# Patient Record
Sex: Male | Born: 1965 | Race: Black or African American | Hispanic: No | Marital: Single | Smoking: Current every day smoker
Health system: Southern US, Community
[De-identification: ages and names within clinical notes are randomized; demographics above are authoritative.]

## PROBLEM LIST (undated history)

## (undated) DIAGNOSIS — I5042 Chronic combined systolic (congestive) and diastolic (congestive) heart failure: Secondary | ICD-10-CM

## (undated) DIAGNOSIS — I2510 Atherosclerotic heart disease of native coronary artery without angina pectoris: Secondary | ICD-10-CM

## (undated) DIAGNOSIS — J189 Pneumonia, unspecified organism: Secondary | ICD-10-CM

## (undated) DIAGNOSIS — I10 Essential (primary) hypertension: Secondary | ICD-10-CM

## (undated) DIAGNOSIS — Z91199 Patient's noncompliance with other medical treatment and regimen due to unspecified reason: Secondary | ICD-10-CM

## (undated) DIAGNOSIS — I5022 Chronic systolic (congestive) heart failure: Secondary | ICD-10-CM

## (undated) DIAGNOSIS — E119 Type 2 diabetes mellitus without complications: Secondary | ICD-10-CM

## (undated) DIAGNOSIS — R0602 Shortness of breath: Secondary | ICD-10-CM

## (undated) DIAGNOSIS — E785 Hyperlipidemia, unspecified: Secondary | ICD-10-CM

## (undated) DIAGNOSIS — Z87891 Personal history of nicotine dependence: Secondary | ICD-10-CM

## (undated) DIAGNOSIS — R609 Edema, unspecified: Secondary | ICD-10-CM

## (undated) DIAGNOSIS — Z9119 Patient's noncompliance with other medical treatment and regimen: Secondary | ICD-10-CM

## (undated) DIAGNOSIS — I491 Atrial premature depolarization: Secondary | ICD-10-CM

## (undated) DIAGNOSIS — G4733 Obstructive sleep apnea (adult) (pediatric): Secondary | ICD-10-CM

## (undated) DIAGNOSIS — I428 Other cardiomyopathies: Secondary | ICD-10-CM

## (undated) DIAGNOSIS — G4730 Sleep apnea, unspecified: Secondary | ICD-10-CM

## (undated) HISTORY — DX: Patient's noncompliance with other medical treatment and regimen: Z91.19

## (undated) HISTORY — DX: Patient's noncompliance with other medical treatment and regimen due to unspecified reason: Z91.199

## (undated) HISTORY — DX: Atherosclerotic heart disease of native coronary artery without angina pectoris: I25.10

## (undated) HISTORY — DX: Edema, unspecified: R60.9

## (undated) HISTORY — DX: Shortness of breath: R06.02

## (undated) HISTORY — DX: Chronic systolic (congestive) heart failure: I50.22

## (undated) HISTORY — DX: Other cardiomyopathies: I42.8

## (undated) HISTORY — PX: FRACTURE SURGERY: SHX138

## (undated) HISTORY — DX: Chronic combined systolic (congestive) and diastolic (congestive) heart failure: I50.42

## (undated) HISTORY — DX: Atrial premature depolarization: I49.1

## (undated) HISTORY — DX: Sleep apnea, unspecified: G47.30

---

## 1998-09-13 ENCOUNTER — Emergency Department (HOSPITAL_COMMUNITY): Admission: EM | Admit: 1998-09-13 | Discharge: 1998-09-13 | Payer: Self-pay | Admitting: *Deleted

## 2000-08-21 ENCOUNTER — Encounter: Payer: Self-pay | Admitting: *Deleted

## 2000-08-21 ENCOUNTER — Emergency Department (HOSPITAL_COMMUNITY): Admission: EM | Admit: 2000-08-21 | Discharge: 2000-08-21 | Payer: Self-pay | Admitting: Emergency Medicine

## 2004-08-17 ENCOUNTER — Ambulatory Visit: Payer: Self-pay | Admitting: Physical Medicine & Rehabilitation

## 2004-08-17 ENCOUNTER — Inpatient Hospital Stay (HOSPITAL_COMMUNITY): Admission: EM | Admit: 2004-08-17 | Discharge: 2004-08-22 | Payer: Self-pay | Admitting: Emergency Medicine

## 2004-08-22 ENCOUNTER — Inpatient Hospital Stay (HOSPITAL_COMMUNITY)
Admission: RE | Admit: 2004-08-22 | Discharge: 2004-08-29 | Payer: Self-pay | Admitting: Physical Medicine & Rehabilitation

## 2006-04-16 ENCOUNTER — Emergency Department (HOSPITAL_COMMUNITY): Admission: EM | Admit: 2006-04-16 | Discharge: 2006-04-16 | Payer: Self-pay | Admitting: Emergency Medicine

## 2010-08-01 ENCOUNTER — Emergency Department (HOSPITAL_COMMUNITY): Admission: EM | Admit: 2010-08-01 | Discharge: 2010-08-02 | Payer: Self-pay | Admitting: Emergency Medicine

## 2010-11-03 HISTORY — PX: FEMUR IM NAIL: SHX1597

## 2011-01-16 LAB — POCT I-STAT, CHEM 8
BUN: 16 mg/dL (ref 6–23)
Calcium, Ion: 1.08 mmol/L — ABNORMAL LOW (ref 1.12–1.32)
Chloride: 107 mEq/L (ref 96–112)
Creatinine, Ser: 1.2 mg/dL (ref 0.4–1.5)
Glucose, Bld: 120 mg/dL — ABNORMAL HIGH (ref 70–99)
HCT: 48 % (ref 39.0–52.0)
Hemoglobin: 16.3 g/dL (ref 13.0–17.0)
Potassium: 3.7 mEq/L (ref 3.5–5.1)
Sodium: 138 mEq/L (ref 135–145)
TCO2: 23 mmol/L (ref 0–100)

## 2011-01-16 LAB — DIFFERENTIAL
Basophils Absolute: 0 10*3/uL (ref 0.0–0.1)
Basophils Relative: 0 % (ref 0–1)
Eosinophils Absolute: 0.2 10*3/uL (ref 0.0–0.7)
Eosinophils Relative: 1 % (ref 0–5)
Lymphocytes Relative: 17 % (ref 12–46)
Lymphs Abs: 2.7 10*3/uL (ref 0.7–4.0)
Monocytes Absolute: 1.2 10*3/uL — ABNORMAL HIGH (ref 0.1–1.0)
Monocytes Relative: 8 % (ref 3–12)
Neutro Abs: 11.4 10*3/uL — ABNORMAL HIGH (ref 1.7–7.7)
Neutrophils Relative %: 73 % (ref 43–77)

## 2011-01-16 LAB — CBC
HCT: 44.9 % (ref 39.0–52.0)
Hemoglobin: 15.4 g/dL (ref 13.0–17.0)
MCH: 32.8 pg (ref 26.0–34.0)
MCHC: 34.3 g/dL (ref 30.0–36.0)
MCV: 95.7 fL (ref 78.0–100.0)
Platelets: 267 10*3/uL (ref 150–400)
RBC: 4.69 MIL/uL (ref 4.22–5.81)
RDW: 14.3 % (ref 11.5–15.5)
WBC: 15.5 10*3/uL — ABNORMAL HIGH (ref 4.0–10.5)

## 2011-03-21 NOTE — Op Note (Signed)
NAMEJAYMIE, Luis [name]              ACCOUNT NO.:  [id number]   MEDICAL RECORD NO.:  [id number]          PATIENT TYPE:  INP   LOCATION:  5729                         FACILITY:  [facility]   PHYSICIAN:  Nadara Mustard, M.D.   DATE OF BIRTH:  Jan 07, 1966   DATE OF PROCEDURE:  08/18/2004  DATE OF DISCHARGE:                                 OPERATIVE REPORT   PREOPERATIVE DIAGNOSIS:  Right femur fracture.   POSTOPERATIVE DIAGNOSIS:  Right femur fracture.   PROCEDURE:  Intramedullary nail, antegrade, Smith & Nephew titanium nail  11.5 x 420 mm.   SURGEON:  Nadara Mustard, M.D.   ANESTHESIA:  General.   ESTIMATED BLOOD LOSS:  Minimal.   ANTIBIOTICS:  Kefzol 2 g.   DISPOSITION:  To PACU in stable condition.   INDICATIONS FOR PROCEDURE:  The patient is a 45 year old gentleman who was  running from a dog last night, twisted, fell sustaining a mid-shaft  transverse right femur fracture.  The patient was stabilized and was brought  to the OR at this time for internal fixation. The risks and benefits were  discussed, including infection, neurovascular injury, persistent pain,  fracture, failure of the hardware, need for additional surgery, DVT,  pulmonary embolus.  The patient states he understands and wishes to proceed  at this time.   DESCRIPTION OF PROCEDURE:  The patient was brought to the OR #5 and  underwent a general anesthetic. After an adequate level of anesthesia was  obtained, the patient was placed  supine on the [facility] fracture table and  his right lower extremity was placed in the boot traction, and the left  lower extremity was placed in the well-leg dorsal lithotomy, well positioned  leg holder.  The right lower extremity was prepped using DuraPrep and draped  into a sterile field, with the shower curtain drape.   A 2 inch incision was made just proximal to the greater trochanter on the  right hip. Sharp dissection was carried down to the greater trochanter.  A  guide wire  was inserted through the superior aspect of the greater  trochanter and C-arm fluoroscopy verified alignment in both AP and lateral  planes. This was then proximally reamed over to 17.5 mm and then sequenced.  A guide wire was then inserted and using the guide wire inserter, the  fracture was reduced and the guide wire advanced across the fracture site.  The femoral canal was then sequentially reamed to 14.5 mm.  An 11.5 mm nail  was chosen, 420 mm in length and this was inserted across the fracture site.  C-arm fluoroscopy verified reduction during the insertion.  There was no  traumatic injury at the fracture site during insertion of the nail.  The  nail was inserted and locked proximally with a 65 mm proximal screw.  The  fracture site was well opposed and a distal locking screw was not used.   The instruments were removed.  C-arm fluoroscopy again verified reduction,  both AP and lateral planes. The wound was irrigated with normal saline.  Subcutaneous was closed using 2-0 Vicryl, skin was closed  using Approximate  staples. The wounds were covered with Adaptic, orthopedic sponges, AB  dressing and Hypafix tape.  The patient was extubated, the patient was taken  to the PACU in stable condition.       MVD/MEDQ  D:  08/18/2004  T:  08/18/2004  Job:  [id number]

## 2011-03-21 NOTE — Discharge Summary (Signed)
[name], [name]              ACCOUNT NO.:  [id number]   MEDICAL RECORD NO.:  [id number]          PATIENT TYPE:  INP   LOCATION:  5021                         FACILITY:  [facility]   PHYSICIAN:  Nadara Mustard, MD     DATE OF BIRTH:  1966-06-01   DATE OF ADMISSION:  08/17/2004  DATE OF DISCHARGE:  08/22/2004                                 DISCHARGE SUMMARY   DIAGNOSIS:  Mid shaft right femur fracture.   PROCEDURE:  Intramedullary nailing, right femur.   Discharged to rehab in stable condition.  Followup in the office in 2 weeks  after discharge.   HISTORY OF PRESENT ILLNESS:  The patient is a 45 year old gentleman,  positive ETOH, who was running away from a dog, tripped and fell, sustaining  a right mid shaft femur fracture.  The patient was evaluated and felt to be  stable for surgical intervention and presents for internal fixation.   HOSPITAL COURSE:  The patient's hospital course was essentially  unremarkable.  He underwent IM nailing of the right femur on 08/18/04 with a  Smith & Nephew nail 11.5 x 420 mm.  He received 2 g of Kefzol IV.  Postoperatively, the patient progressed slowly with physical therapy and  rehab was consulted.  He received Kefzol for infection prophylaxis for 24  hours and received Coumadin for DVT prophylaxis.  Due to the patient's slow  progression with physical therapy he was discharged to rehab in stable  condition on 08/22/04 with followup in the office in 2 weeks.      Vernia Buff   MVD/MEDQ  D:  10/15/2004  T:  10/15/2004  Job:  [id number]

## 2011-03-21 NOTE — Discharge Summary (Signed)
Luis [name], Luis [name]              ACCOUNT NO.:  [id number]   MEDICAL RECORD NO.:  [id number]          PATIENT TYPE:  IPS   LOCATION:  4029                         FACILITY:  [facility]   PHYSICIAN:  Ranelle Oyster, M.D.DATE OF BIRTH:  Mar 25, 1966   DATE OF ADMISSION:  08/22/2004  DATE OF DISCHARGE:  08/29/2004                                 DISCHARGE SUMMARY   DISCHARGE DIAGNOSES:  1.  Right mid femur fracture with open reduction internal fixation.  2.  Postoperative anemia.   HISTORY OF PRESENT ILLNESS:  Luis [name] is a 45 year old male in relatively  good health who sustained a fall October 15 with right thigh pain secondary  to mid femur oblique fracture.  Postoperative touchdown weightbearing and on  Coumadin for DVT prophylaxis.  He has been slow to progress past surgery.  Currently, he is at [facility] assist for transfers, min assist ambulating 75 feet.   PAST MEDICAL HISTORY:  Negative for childhood illnesses or prior surgeries.   ALLERGIES:  No known drug allergies.   SOCIAL HISTORY:  Patient lives with mother in [place] apartment.  Has  17 steps to entry.  Worked at [employer] prior to admission.  He  smokes 10 cigarettes a day.  Uses alcohol occasionally.   HOSPITAL COURSE:  Luis [name] was admitted to rehabilitation on  August 22, 2004 for inpatient therapies to consist of PT/OT daily.  Laboratories done past admission showed mild leukocytosis with white count  11.1.  CBC showed H&H of 13.5, 39.0.  Check of lytes showed sodium 135,  potassium 4.3, chloride 101, CO2 24, BUN 12, creatinine 1.1, glucose 112.  Patient was noted to have low grade fever at admission and a UA/UCS was sent  off.  UA was negative and urine culture showed no growth.  A repeat CBC  showed white count to be stable with WBC 11.2, H&H stable at 13.0/37.3.  As  patient's mobility improved and with encouragement of spirometry patient  defervesced nicely and has been afebrile for the last  few days.  Patient's  hip incision has been healing well without any signs or symptoms of  infection.  No drainage, no erythema noted.  Staples were discontinued and  area Steri-Stripped on postoperative day 11 without difficulty.  Patient was  maintained on Coumadin throughout his stay.  At time of discharge Coumadin  is changed over to coated aspirin 325 mg one per day for a month.  At time  of discharge patient's mobility has greatly improved.  Currently, patient is  modified independent for ADL needs.  He is currently at min assist for bed  mobilities, supervision for transfers, modified independent for ambulating  80 feet with standard walker.  He is able to navigate stairs with min to mod  assist.  He is modified independent for navigating wheelchair.  Further  follow-up therapies to include home health, PT/OT by [facility].  On August 29, 2004 patient is discharged to home.   DISCHARGE MEDICATIONS:  1.  Coated aspirin 325 mg one per day.  2.  OxyContin CR 20 mg p.o.  b.i.d. x1 week, then one per day for a week      until gone.  3.  Oxycodone IR 5-10 mg q.4-6h. p.r.n. pain.  4.  Robaxin 500 mg p.o. q.i.d. p.r.n. spasm.  5.  Senokot S two p.o. q.h.s.  6.  Multivitamin with iron one per day.   ACTIVITY:  Touchdown weightbearing on right lower extremity.   DIET:  Regular.   WOUND CARE:  Keep area clean and dry.   SPECIAL INSTRUCTIONS:  [facility] to provide PT/OT RN.   FOLLOWUP:  Patient to follow up with Dr. Lajoyce Corners in two weeks for postoperative  check.  Follow up with Dr. Riley Kill as needed.       PP/MEDQ  D:  08/29/2004  T:  08/30/2004  Job:  [id number]   cc:   Nadara Mustard, MD  Fax: [phone]

## 2011-04-03 ENCOUNTER — Emergency Department (HOSPITAL_COMMUNITY)
Admission: EM | Admit: 2011-04-03 | Discharge: 2011-04-03 | Disposition: A | Discharge status: Home / Self Care | Payer: BC Managed Care – PPO | Attending: Emergency Medicine | Admitting: Emergency Medicine

## 2011-04-03 DIAGNOSIS — L03221 Cellulitis of neck: Secondary | ICD-10-CM | POA: Insufficient documentation

## 2011-04-03 DIAGNOSIS — R221 Localized swelling, mass and lump, neck: Secondary | ICD-10-CM | POA: Insufficient documentation

## 2011-04-03 DIAGNOSIS — M542 Cervicalgia: Secondary | ICD-10-CM | POA: Insufficient documentation

## 2011-04-03 DIAGNOSIS — L0211 Cutaneous abscess of neck: Secondary | ICD-10-CM | POA: Insufficient documentation

## 2011-04-03 DIAGNOSIS — R220 Localized swelling, mass and lump, head: Secondary | ICD-10-CM | POA: Insufficient documentation

## 2012-11-29 ENCOUNTER — Emergency Department (HOSPITAL_COMMUNITY)
Admission: EM | Admit: 2012-11-29 | Discharge: 2012-11-29 | Disposition: A | Discharge status: Home / Self Care | Payer: BC Managed Care – PPO | Attending: Emergency Medicine | Admitting: Emergency Medicine

## 2012-11-29 ENCOUNTER — Emergency Department (HOSPITAL_COMMUNITY): Payer: BC Managed Care – PPO

## 2012-11-29 ENCOUNTER — Emergency Department (HOSPITAL_COMMUNITY)
Admission: EM | Admit: 2012-11-29 | Discharge: 2012-11-29 | Discharge status: Against Medical Advice | Payer: BC Managed Care – PPO | Source: Home / Self Care

## 2012-11-29 ENCOUNTER — Encounter (HOSPITAL_COMMUNITY): Payer: Self-pay | Admitting: Emergency Medicine

## 2012-11-29 DIAGNOSIS — F17200 Nicotine dependence, unspecified, uncomplicated: Secondary | ICD-10-CM | POA: Insufficient documentation

## 2012-11-29 DIAGNOSIS — I10 Essential (primary) hypertension: Secondary | ICD-10-CM | POA: Insufficient documentation

## 2012-11-29 DIAGNOSIS — J159 Unspecified bacterial pneumonia: Secondary | ICD-10-CM | POA: Insufficient documentation

## 2012-11-29 DIAGNOSIS — Z79899 Other long term (current) drug therapy: Secondary | ICD-10-CM | POA: Insufficient documentation

## 2012-11-29 DIAGNOSIS — J189 Pneumonia, unspecified organism: Secondary | ICD-10-CM

## 2012-11-29 DIAGNOSIS — R0789 Other chest pain: Secondary | ICD-10-CM | POA: Insufficient documentation

## 2012-11-29 LAB — POCT I-STAT TROPONIN I: Troponin i, poc: 0.01 ng/mL (ref 0.00–0.08)

## 2012-11-29 LAB — COMPREHENSIVE METABOLIC PANEL
ALT: 23 U/L (ref 0–53)
AST: 18 U/L (ref 0–37)
Albumin: 3.7 g/dL (ref 3.5–5.2)
Alkaline Phosphatase: 135 U/L — ABNORMAL HIGH (ref 39–117)
BUN: 16 mg/dL (ref 6–23)
CO2: 22 mEq/L (ref 19–32)
Calcium: 8.9 mg/dL (ref 8.4–10.5)
Chloride: 101 mEq/L (ref 96–112)
Creatinine, Ser: 1.1 mg/dL (ref 0.50–1.35)
GFR calc Af Amer: >90 mL/min (ref >90)
GFR calc non Af Amer: 80 mL/min — ABNORMAL LOW (ref >90)
Glucose, Bld: 100 mg/dL — ABNORMAL HIGH (ref 70–99)
Potassium: 3.9 mEq/L (ref 3.5–5.1)
Sodium: 136 mEq/L (ref 135–145)
Total Bilirubin: 0.2 mg/dL — ABNORMAL LOW (ref 0.3–1.2)
Total Protein: 7.5 g/dL (ref 6.0–8.3)

## 2012-11-29 LAB — CBC WITH DIFFERENTIAL/PLATELET
Basophils Absolute: 0.1 10*3/uL (ref 0.0–0.1)
Basophils Relative: 1 % (ref 0–1)
Eosinophils Absolute: 0.4 10*3/uL (ref 0.0–0.7)
Eosinophils Relative: 5 % (ref 0–5)
HCT: 48.8 % (ref 39.0–52.0)
Hemoglobin: 16.8 g/dL (ref 13.0–17.0)
Lymphocytes Relative: 40 % (ref 12–46)
Lymphs Abs: 3.1 10*3/uL (ref 0.7–4.0)
MCH: 32.3 pg (ref 26.0–34.0)
MCHC: 34.4 g/dL (ref 30.0–36.0)
MCV: 93.8 fL (ref 78.0–100.0)
Monocytes Absolute: 0.7 10*3/uL (ref 0.1–1.0)
Monocytes Relative: 9 % (ref 3–12)
Neutro Abs: 3.5 10*3/uL (ref 1.7–7.7)
Neutrophils Relative %: 46 % (ref 43–77)
Platelets: 276 10*3/uL (ref 150–400)
RBC: 5.2 MIL/uL (ref 4.22–5.81)
RDW: 14.2 % (ref 11.5–15.5)
WBC: 7.7 10*3/uL (ref 4.0–10.5)

## 2012-11-29 LAB — D-DIMER, QUANTITATIVE: D-Dimer, Quant: 0.35 ug/mL-FEU (ref 0.00–0.48)

## 2012-11-29 LAB — TROPONIN I
Troponin I: <0.3 ng/mL (ref <0.30)
Troponin I: <0.3 ng/mL (ref <0.30)

## 2012-11-29 LAB — PRO B NATRIURETIC PEPTIDE: Pro B Natriuretic peptide (BNP): 355 pg/mL — ABNORMAL HIGH (ref 0–125)

## 2012-11-29 MED ORDER — ALBUTEROL SULFATE HFA 108 (90 BASE) MCG/ACT IN AERS
INHALATION_SPRAY | RESPIRATORY_TRACT | Status: AC
  Administered 2012-11-29: 2
  Filled 2012-11-29: qty 6.7

## 2012-11-29 MED ORDER — ASPIRIN 81 MG PO CHEW
324.0000 mg | CHEWABLE_TABLET | Freq: Once | ORAL | Status: AC
  Administered 2012-11-29: 324 mg via ORAL
  Filled 2012-11-29: qty 4

## 2012-11-29 MED ORDER — HYDRALAZINE HCL 20 MG/ML IJ SOLN: 10.0000 mg | Freq: Once | INTRAMUSCULAR | Status: DC

## 2012-11-29 MED ORDER — IOHEXOL 350 MG/ML SOLN
100.0000 mL | Freq: Once | INTRAVENOUS | Status: AC | PRN
  Administered 2012-11-29: 100 mL via INTRAVENOUS

## 2012-11-29 MED ORDER — DOXYCYCLINE HYCLATE 100 MG PO CAPS: 100.0000 mg | ORAL_CAPSULE | Freq: Two times a day (BID) | ORAL | Status: DC

## 2012-11-29 MED ORDER — ALBUTEROL SULFATE HFA 108 (90 BASE) MCG/ACT IN AERS: 2.0000 | INHALATION_SPRAY | RESPIRATORY_TRACT | Status: DC | PRN

## 2012-11-29 NOTE — ED Notes (Signed)
Patient is alert and oriented.  Albuterol teaching performed.  Patient verbalized understanding.  He denies chest pain at time of discharge.  Repeat bp 147/114.   ERMD at bedside,  D/c hydralazine. Patient educated/encouraged to return to ED as needed for any s/sx of chest pain or worsening condition

## 2012-11-29 NOTE — ED Notes (Signed)
Pt. Ate 100% of dinner. oob to the bathroom, gait steady.  Dr. Serina Cowper  Aware

## 2012-11-29 NOTE — ED Notes (Signed)
Chest pain on and off x 2-4 weeks coughing up thick  Green mucus pt sweating alot

## 2012-11-29 NOTE — ED Notes (Signed)
Chest pain x  1 -2 weeks and coughing up thick green congestion pt sweating. No n/v/sob

## 2012-11-29 NOTE — ED Provider Notes (Addendum)
History  This chart was scribed for Glynn Octave, MD by Bennett Scrape, ED Scribe. This patient was seen in room A12C/A12C and the patient's care was started at 2:59 PM.  CSN: [id number]  Arrival date & time 11/29/12  1448   First MD Initiated Contact with Patient 11/29/12 1459      No chief complaint on file.   The history is provided by the patient. No language interpreter was used.    Luis [name] is a 47 y.o. male who presents to the Emergency Department complaining of 2 weeks of gradual onset, non-changing, intermittent sternally located CP episodes that last from minutes to hours (longest episode was 10 hours) that occurs mostly at night and occasionally radiates to the right chest with associated cough productive of green mucus. Last episode was 2 days ago while at work and he reports that he is here for evaluation today because it is his day off. He denies involvement of his extremities or radiation to his neck or back. He reports that smoking and eating occasionally make the pain worse but he denies having any improving modifying factors. He denies having a h/o heart or lung problems, HTN, HLD and DM. He denies having any prior stress test or cardiac catheterizations. He reports that he has been eating and drinking normally. He states that he has a chronic "smoker's cough" but denies SOB, nausea, emesis, sore throat, and fever as associated symptoms. He does not have a h/o chronic medical conditions.is a current everyday smoker but denies alcohol use.  PCP is Dr. Mayford Knife on [address] (Walk-in clinic).  History reviewed. No pertinent past medical history.  No past surgical history on file.  No family history on file.  History  Substance Use Topics  . Smoking status: Current Every Day Smoker  . Smokeless tobacco: Not on file  . Alcohol Use: No      Review of Systems  A complete 10 system review of systems was obtained and all systems are negative except as  noted in the HPI and PMH.   Allergies  Morphine and related  Home Medications   Current Outpatient Rx  Name  Route  Sig  Dispense  Refill  . ALBUTEROL SULFATE HFA 108 (90 BASE) MCG/ACT IN AERS   Inhalation   Inhale 2 puffs into the lungs every 4 (four) hours as needed for wheezing.   1 Inhaler   0   . DOXYCYCLINE HYCLATE 100 MG PO CAPS   Oral   Take 1 capsule (100 mg total) by mouth 2 (two) times daily.   20 capsule   0     Triage Vitals: BP 182/112  Pulse 94  Temp 98.6 F (37 C) (Oral)  Resp 16  SpO2 98%  Physical Exam  Nursing note and vitals reviewed. Constitutional: He is oriented to person, place, and time. He appears well-developed and well-nourished. No distress.  HENT:  Head: Normocephalic and atraumatic.  Mouth/Throat: Oropharynx is clear and moist.       Handling secretions   Eyes: Conjunctivae normal and EOM are normal. Pupils are equal, round, and reactive to light.  Neck: Normal range of motion. Neck supple. No tracheal deviation present.  Cardiovascular: An irregular rhythm present. Tachycardia present.        HR 111  Pulmonary/Chest: Effort normal. No respiratory distress. He has decreased breath sounds. He exhibits no tenderness (no reproducible chest tenderness upon palpation ).       Decreased lung sounds bilaterally  Abdominal: Soft. There is no tenderness.  Musculoskeletal: Normal range of motion. He exhibits no edema.       Equal peripheral pulses, no peripheral edema  Neurological: He is alert and oriented to person, place, and time.  Skin: Skin is warm and dry.  Psychiatric: He has a normal mood and affect. His behavior is normal.    ED Course  Procedures (including critical care time)  DIAGNOSTIC STUDIES: Oxygen Saturation is 98% on room air, normal by my interpretation.    COORDINATION OF CARE: 3:06 PM- Informed pt of normal heart tracings but advised pt that this doesn't rule out a MI. Discussed treatment plan which includes CXR,  CBC panel, d-dimer, and troponin with pt at bedside and pt agreed to plan.   3:15 PM- Ordered 324 mg ASA tablet  4:07 PM-Pt rechecked and feels improved. Informed pt of negative heart tracings and CXR results. Discussed need to have a stress test as an outpatient due to h/o smoking and pt agreed. Advised pt that I am awaiting a second troponin.   7:49 PM-Pt rechecked and feels improved. Informed pt of lung nodule that needs to be rechecked in one year through CT scan. Advised pt to f/u PCP on HTN and stress test. Pt is agreeable to this plan.  Labs Reviewed  COMPREHENSIVE METABOLIC PANEL - Abnormal; Notable for the following:    Glucose, Bld 100 (*)     Alkaline Phosphatase 135 (*)     Total Bilirubin 0.2 (*)     GFR calc non Af Amer 80 (*)     All other components within normal limits  PRO B NATRIURETIC PEPTIDE - Abnormal; Notable for the following:    Pro B Natriuretic peptide (BNP) 355.0 (*)     All other components within normal limits  CBC WITH DIFFERENTIAL  TROPONIN I  D-DIMER, QUANTITATIVE  POCT I-STAT TROPONIN I  TROPONIN I  URINE RAPID DRUG SCREEN (HOSP PERFORMED)   Dg Chest 2 View  11/29/2012  *RADIOLOGY REPORT*  Clinical Data: Cough and chest pain.  CHEST - 2 VIEW  Comparison: 04/16/2006.  Findings: The cardiac silhouette, and the hilar contours are within normal limits and stable.  The lungs are clear.  Mild bronchitic changes.  No pleural effusion.  The bony thorax is intact.  Stable apical scarring and apical pleural thickening.  IMPRESSION: Mild bronchitic changes but no infiltrates or effusions.   Original Report Authenticated By: Rudie Meyer, M.D.    Ct Angio Chest Pe W/cm &/or Wo Cm  11/29/2012  *RADIOLOGY REPORT*  Clinical Data: Sternal chest pain  CT ANGIOGRAPHY CHEST  Technique:  Multidetector CT imaging of the chest using the standard protocol during bolus administration of intravenous contrast. Multiplanar reconstructed images including MIPs were obtained and  reviewed to evaluate the vascular anatomy.  Contrast: OMNIPAQUE IOHEXOL 350 MG/ML SOLN  Comparison: Chest radiographs dated 11/29/2012.  CTA chest dated 04/16/2006.  Findings: No evidence of pulmonary embolism.  Mild patchy opacity in the lingula (series 5/image 60), possibly infectious.  Additional mild left basilar opacity (series 5/image 77), incompletely visualized, possibly atelectasis.  3 mm left upper lobe nodule (series 5/image 42), new from 2007. No pleural effusion or pneumothorax.  Visualized thyroid is unremarkable.  The heart is top normal in size.  No pericardial effusion.  No suspicious mediastinal, hilar, or axillary lymphadenopathy.  Degenerative changes of the visualized thoracolumbar spine.  IMPRESSION: No evidence of pulmonary embolism.  Mild patchy opacity in the lingula, possibly infectious.  3 mm left upper lobe nodule, new from 2007, although statistically likely benign.  Given risk factors for primary bronchogenic neoplasm, a single follow-up CT chest is suggested in 12 months.  This recommendation follows the consensus statement: Guidelines for Management of Small Pulmonary Nodules Detected on CT Scans: A Statement from the Fleischner Society as published in Radiology 2005; 237:395-400.   Original Report Authenticated By: Charline Bills, M.D.      1. CAP (community acquired pneumonia)   2. Atypical chest pain   3. Hypertension       MDM  2 weeks of intermittent substernal chest pain associated with productive cough. Pain comes and goes lasting for minutes to hours at a time. There is no associated SOB, nausea, diaphoresis. No cardiac or pulmonary history.   EKG unchanged from previous. Troponin negative. Chest pain is atypical for ACS. No chest pain throughout stay in ED.  Patient found to be hypertensive in the ED without history of the same. Blood pressures are equal in the bilateral upper extremities. There is no neurological or pulse deficit. He is pain-free in  the ED. History is inconsistent with aortic dissection.  CT negative for PE or dissection. Patient informed of lung nodule need for follow up in one year. We'll treat patchy opacity in the lingula for pneumonia as the patient has had productive cough. He will need outpatient followup for stress test with his PCP.     Date: 11/29/2012  Rate: 89  Rhythm: normal sinus rhythm  QRS Axis: normal  Intervals: normal  ST/T Wave abnormalities: nonspecific ST/T changes  Conduction Disutrbances:none  Narrative Interpretation: unchanged T wave inversion lead III, new lateral T wave flattening  Old EKG Reviewed: unchanged         Glynn Octave, MD 11/29/12 2127  Glynn Octave, MD 11/29/12 2350

## 2013-01-03 ENCOUNTER — Encounter (HOSPITAL_COMMUNITY): Payer: Self-pay | Admitting: Emergency Medicine

## 2013-01-03 ENCOUNTER — Emergency Department (INDEPENDENT_AMBULATORY_CARE_PROVIDER_SITE_OTHER)
Admission: EM | Admit: 2013-01-03 | Discharge: 2013-01-03 | Disposition: A | Discharge status: Home / Self Care | Payer: BC Managed Care – PPO | Source: Home / Self Care

## 2013-01-03 DIAGNOSIS — R05 Cough: Secondary | ICD-10-CM

## 2013-01-03 DIAGNOSIS — R059 Cough, unspecified: Secondary | ICD-10-CM

## 2013-01-03 DIAGNOSIS — I10 Essential (primary) hypertension: Secondary | ICD-10-CM

## 2013-01-03 DIAGNOSIS — J40 Bronchitis, not specified as acute or chronic: Secondary | ICD-10-CM

## 2013-01-03 MED ORDER — ALBUTEROL SULFATE HFA 108 (90 BASE) MCG/ACT IN AERS: 2.0000 | INHALATION_SPRAY | Freq: Four times a day (QID) | RESPIRATORY_TRACT | Status: DC | PRN

## 2013-01-03 MED ORDER — AZITHROMYCIN 250 MG PO TABS: 250.0000 mg | ORAL_TABLET | Freq: Every day | ORAL | Status: DC

## 2013-01-03 NOTE — ED Provider Notes (Signed)
Medical screening examination/treatment/procedure(s) were performed by non-physician practitioner and as supervising physician I was immediately available for consultation/collaboration.  Leslee Home, M.D.  Reuben Likes, MD 01/03/13 [phone]

## 2013-01-03 NOTE — ED Provider Notes (Signed)
History     CSN: [id number]  Arrival date & time 01/03/13  1152   First MD Initiated Contact with Patient 01/03/13 1309      Chief Complaint  Patient presents with  . URI    (Consider location/radiation/quality/duration/timing/severity/associated sxs/prior treatment) HPI Comments: 47 year old male who states she has had a cough with thick green sputum for approximately 2-3 weeks. It is associated with runny nose. He denies earache, sore throat or shortness of breath. He denies the perception of PND.   No past medical history on file.  No past surgical history on file.  No family history on file.  History  Substance Use Topics  . Smoking status: Current Every Day Smoker  . Smokeless tobacco: Not on file  . Alcohol Use: No      Review of Systems  Constitutional: Positive for activity change. Negative for fever, diaphoresis and fatigue.  HENT: Positive for sore throat and trouble swallowing. Negative for ear pain, facial swelling, rhinorrhea, neck pain, neck stiffness and postnasal drip.   Eyes: Negative for pain, discharge and redness.  Respiratory: Positive for cough. Negative for chest tightness and shortness of breath.   Cardiovascular: Negative.   Gastrointestinal: Negative.   Musculoskeletal: Negative.   Neurological: Negative.     Allergies  Morphine and related  Home Medications   Current Outpatient Rx  Name  Route  Sig  Dispense  Refill  . albuterol (PROVENTIL HFA;VENTOLIN HFA) 108 (90 BASE) MCG/ACT inhaler   Inhalation   Inhale 2 puffs into the lungs every 4 (four) hours as needed for wheezing.   1 Inhaler   0   . albuterol (PROVENTIL HFA;VENTOLIN HFA) 108 (90 BASE) MCG/ACT inhaler   Inhalation   Inhale 2 puffs into the lungs every 6 (six) hours as needed for wheezing.   1 Inhaler   0   . azithromycin (ZITHROMAX) 250 MG tablet   Oral   Take 1 tablet (250 mg total) by mouth daily. 2 tabs po on day one, then one tablet po once daily on days 2-5.  6 tablet   0   . doxycycline (VIBRAMYCIN) 100 MG capsule   Oral   Take 1 capsule (100 mg total) by mouth 2 (two) times daily.   20 capsule   0     BP 167/118  Pulse 99  Temp(Src) 98.8 F (37.1 C) (Oral)  Resp 22  SpO2 98%  Physical Exam  Nursing note and vitals reviewed. Constitutional: He is oriented to person, place, and time. He appears well-developed and well-nourished. No distress.  HENT:  Right Ear: External ear normal.  Left Ear: External ear normal.  Mouth/Throat: No oropharyngeal exudate.  Minor erythema and clear PND or the oropharynx.  Eyes: Conjunctivae and EOM are normal.  Neck: Normal range of motion. Neck supple.  Cardiovascular: Normal rate, regular rhythm and normal heart sounds.   Pulmonary/Chest: Effort normal. No respiratory distress. He has no rales.  Slight prolongation of the expiratory phase. Rare expiratory wheeze. Otherwise lungs are clear with good expansion  Musculoskeletal: Normal range of motion. He exhibits no edema.  Lymphadenopathy:    He has no cervical adenopathy.  Neurological: He is alert and oriented to person, place, and time.  Skin: Skin is warm and dry. No rash noted.  Psychiatric: He has a normal mood and affect.    ED Course  Procedures (including critical care time)  Labs Reviewed - No data to display No results found.   1. Bronchitis   2.  Cough   3. HTN (hypertension)       MDM  Robitussin-DM 2 teaspoons every 4 hours when necessary cough and congestion Albuterol HFA 2 puffs every 4 hours when necessary cough and wheeze Z-Pak Drink plenty of fluids stay well hydrated , With your doctor this week. He will need to have your blood pressure managed.        Hayden Rasmussen, NP 01/03/13 1350

## 2013-01-03 NOTE — ED Notes (Signed)
Pt is here for cold sx x3 weeks Sx include: cough w/green mucous, SOB, nasal congestion, runny nose Denies: f/v/n/d Has tried mucinex and dayquil w/little relief  He is alert w/no signs of acute respiratory distress.

## 2015-10-18 ENCOUNTER — Emergency Department (HOSPITAL_COMMUNITY): Payer: BLUE CROSS/BLUE SHIELD

## 2015-10-18 ENCOUNTER — Encounter (HOSPITAL_COMMUNITY): Payer: Self-pay | Admitting: Emergency Medicine

## 2015-10-18 ENCOUNTER — Emergency Department (HOSPITAL_COMMUNITY)
Admission: EM | Admit: 2015-10-18 | Discharge: 2015-10-18 | Discharge status: Against Medical Advice | Payer: BLUE CROSS/BLUE SHIELD | Attending: Emergency Medicine | Admitting: Emergency Medicine

## 2015-10-18 DIAGNOSIS — J189 Pneumonia, unspecified organism: Secondary | ICD-10-CM

## 2015-10-18 DIAGNOSIS — I10 Essential (primary) hypertension: Secondary | ICD-10-CM | POA: Diagnosis not present

## 2015-10-18 DIAGNOSIS — Z87891 Personal history of nicotine dependence: Secondary | ICD-10-CM | POA: Diagnosis not present

## 2015-10-18 DIAGNOSIS — Z79899 Other long term (current) drug therapy: Secondary | ICD-10-CM | POA: Insufficient documentation

## 2015-10-18 DIAGNOSIS — J159 Unspecified bacterial pneumonia: Secondary | ICD-10-CM | POA: Insufficient documentation

## 2015-10-18 DIAGNOSIS — R0602 Shortness of breath: Secondary | ICD-10-CM | POA: Diagnosis present

## 2015-10-18 LAB — I-STAT TROPONIN, ED
TROPONIN I, POC: 0.09 ng/mL — AB (ref 0.00–0.08)
Troponin i, poc: 0.07 ng/mL (ref 0.00–0.08)

## 2015-10-18 LAB — CBC
HCT: 46.9 % (ref 39.0–52.0)
HEMOGLOBIN: 15.2 g/dL (ref 13.0–17.0)
MCH: 31.5 pg (ref 26.0–34.0)
MCHC: 32.4 g/dL (ref 30.0–36.0)
MCV: 97.3 fL (ref 78.0–100.0)
PLATELETS: 239 10*3/uL (ref 150–400)
RBC: 4.82 MIL/uL (ref 4.22–5.81)
RDW: 14.4 % (ref 11.5–15.5)
WBC: 4.9 10*3/uL (ref 4.0–10.5)

## 2015-10-18 LAB — BASIC METABOLIC PANEL
Anion gap: 8 (ref 5–15)
BUN: 11 mg/dL (ref 6–20)
CALCIUM: 8.4 mg/dL — AB (ref 8.9–10.3)
CO2: 24 mmol/L (ref 22–32)
CREATININE: 1.24 mg/dL (ref 0.61–1.24)
Chloride: 105 mmol/L (ref 101–111)
GFR calc Af Amer: >60 mL/min (ref >60)
GFR calc non Af Amer: >60 mL/min (ref >60)
GLUCOSE: 105 mg/dL — AB (ref 65–99)
Potassium: 4.1 mmol/L (ref 3.5–5.1)
Sodium: 137 mmol/L (ref 135–145)

## 2015-10-18 MED ORDER — AMLODIPINE BESYLATE 5 MG PO TABS: 5.0000 mg | ORAL_TABLET | Freq: Every day | ORAL | Status: DC

## 2015-10-18 MED ORDER — ALBUTEROL SULFATE HFA 108 (90 BASE) MCG/ACT IN AERS
2.0000 | INHALATION_SPRAY | RESPIRATORY_TRACT | Status: DC | PRN
  Administered 2015-10-18: 2 via RESPIRATORY_TRACT
  Filled 2015-10-18: qty 6.7

## 2015-10-18 MED ORDER — AMLODIPINE BESYLATE 5 MG PO TABS
10.0000 mg | ORAL_TABLET | Freq: Once | ORAL | Status: AC
  Administered 2015-10-18: 10 mg via ORAL
  Filled 2015-10-18: qty 2

## 2015-10-18 MED ORDER — HYDRALAZINE HCL 20 MG/ML IJ SOLN
5.0000 mg | Freq: Once | INTRAMUSCULAR | Status: AC
  Administered 2015-10-18: 5 mg via INTRAVENOUS
  Filled 2015-10-18: qty 1

## 2015-10-18 MED ORDER — AZITHROMYCIN 250 MG PO TABS: 250.0000 mg | ORAL_TABLET | Freq: Every day | ORAL | Status: DC

## 2015-10-18 NOTE — ED Notes (Signed)
Patient verbalized understanding of discharge instructions and denies any further needs or questions at this time. VS stable, patient ambulatory with steady gait and declines use of wheelchair. Assisted to ED entrance.

## 2015-10-18 NOTE — ED Notes (Signed)
Pt sts sore throat and hoarse voice with increasing SOB with exertion x 1 month

## 2015-10-18 NOTE — ED Provider Notes (Signed)
CSN: [id number]     Arrival date & time 10/18/15  1327 History   First MD Initiated Contact with Patient 10/18/15 1601     Chief Complaint  Patient presents with  . Sore Throat  . Shortness of Breath     (Consider location/radiation/quality/duration/timing/severity/associated sxs/prior Treatment) Patient is a 49 y.o. male presenting with pharyngitis and shortness of breath. The history is provided by the patient.  Sore Throat This is a new problem. Associated symptoms include chest pain and shortness of breath. Pertinent negatives include no abdominal pain and no headaches.  Shortness of Breath Associated symptoms: chest pain and wheezing   Associated symptoms: no abdominal pain and no headaches    patient states that over the last month he has had a sore throat shortness of breath and cough. States he's brought up some green sputum. Pain and swelling. He does have occasional chest pain. Sometimes comes on with exertion. States he feels more short of breath. States his children have had similar respiratory symptoms. No swelling in his legs. No headache. States he has had some muscle aches. History of high blood pressure. Has seen Dr. Mayford Knife in the past. States he's been off medications for about a year.  History reviewed. No pertinent past medical history. History reviewed. No pertinent past surgical history. History reviewed. No pertinent family history. Social History  Substance Use Topics  . Smoking status: Former Games developer  . Smokeless tobacco: None  . Alcohol Use: No    Review of Systems  Constitutional: Negative for appetite change.  Eyes: Negative for photophobia.  Respiratory: Positive for shortness of breath and wheezing.   Cardiovascular: Positive for chest pain.  Gastrointestinal: Negative for abdominal pain.  Genitourinary: Negative for flank pain.  Musculoskeletal: Negative for back pain.  Skin: Negative for wound.  Neurological: Negative for headaches.   Psychiatric/Behavioral: Negative for behavioral problems.      Allergies  Morphine and related  Home Medications   Prior to Admission medications   Medication Sig Start Date End Date Taking? Authorizing Provider  albuterol (PROVENTIL HFA;VENTOLIN HFA) 108 (90 BASE) MCG/ACT inhaler Inhale 2 puffs into the lungs every 4 (four) hours as needed for wheezing. Patient not taking: Reported on 10/18/2015 11/29/12   Glynn Octave, MD  amLODipine (NORVASC) 5 MG tablet Take 1 tablet (5 mg total) by mouth daily. 10/18/15   Benjiman Core, MD  azithromycin (ZITHROMAX) 250 MG tablet Take 1 tablet (250 mg total) by mouth daily. 2 tabs po on day one, then one tablet po once daily on days 2-5. 10/18/15   Benjiman Core, MD  doxycycline (VIBRAMYCIN) 100 MG capsule Take 1 capsule (100 mg total) by mouth 2 (two) times daily. Patient not taking: Reported on 10/18/2015 11/29/12   Glynn Octave, MD   BP 171/108 mmHg  Pulse 95  Temp(Src) 99.1 F (37.3 C) (Oral)  Resp 23  Wt 347 lb (157.398 kg)  SpO2 97% Physical Exam  Constitutional: He appears well-developed.  HENT:  Head: Atraumatic.  Cardiovascular: Normal rate.   Pulmonary/Chest: Effort normal. He has wheezes.  Mildly prolonged expirations throughout. Few scattered wheezes but worse in left midlung field.  Abdominal: Soft. There is no tenderness.  Musculoskeletal: Normal range of motion.  Neurological: He is alert.    ED Course  Procedures (including critical care time) Labs Review Labs Reviewed  BASIC METABOLIC PANEL - Abnormal; Notable for the following:    Glucose, Bld 105 (*)    Calcium 8.4 (*)    All other components  within normal limits  I-STAT TROPOININ, ED - Abnormal; Notable for the following:    Troponin i, poc 0.09 (*)    All other components within normal limits  CBC  I-STAT TROPOININ, ED    Imaging Review Dg Chest 2 View  10/18/2015  CLINICAL DATA:  Cough and congestion for 3 weeks; hoarseness EXAM: CHEST  2  VIEW COMPARISON:  Chest radiograph and chest CT November 29, 2012 FINDINGS: There is no edema or consolidation. The heart size and pulmonary vascularity are normal. No adenopathy. There is degenerative change in the thoracic spine. IMPRESSION: No edema or consolidation. Electronically Signed   By: Bretta Bang III M.D.   On: 10/18/2015 13:53   I have personally reviewed and evaluated these images and lab results as part of my medical decision-making.   EKG Interpretation   Date/Time:  Thursday October 18 2015 13:30:19 EST Ventricular Rate:  113 PR Interval:  128 QRS Duration: 100 QT Interval:  340 QTC Calculation: 466 R Axis:   90 Text Interpretation:  Sinus tachycardia Rightward axis T wave abnormality,  consider inferior ischemia Abnormal ECG T waves now downgoing laterally.  Confirmed by Rubin Payor  MD, Harrold Donath [phone]) on 10/18/2015 4:25:37 PM      MDM   Final diagnoses:  CAP (community acquired pneumonia)  Essential hypertension   patient with sore throat and shortness of breath. Has clinical pneumonia since early localizing findings. Has had occasional episodes chest pain. Moderately hypertensive. He is not on any antihypertensives but should be. Has T-wave inversions laterally. Patient is not willing to stay for admission. Left AMA. Started on medicines. States he may return after work today. Depending on his blood pressure may require admission at that time.    Benjiman Core, MD 10/19/15 [phone]

## 2015-10-18 NOTE — Discharge Instructions (Signed)
Community-Acquired Pneumonia, Adult Pneumonia is an infection of the lungs. There are different types of pneumonia. One type can develop while a person is in a hospital. A different type, called community-acquired pneumonia, develops in people who are not, or have not recently been, in the hospital or other health care facility.  CAUSES Pneumonia may be caused by bacteria, viruses, or funguses. Community-acquired pneumonia is often caused by Streptococcus pneumonia bacteria. These bacteria are often passed from one person to another by breathing in droplets from the cough or sneeze of an infected person. RISK FACTORS The condition is more likely to develop in:  People who havechronic diseases, such as chronic obstructive pulmonary disease (COPD), asthma, congestive heart failure, cystic fibrosis, diabetes, or kidney disease.  People who haveearly-stage or late-stage HIV.  People who havesickle cell disease.  People who havehad their spleen removed (splenectomy).  People who havepoor Human resources officer.  People who havemedical conditions that increase the risk of breathing in (aspirating) secretions their own mouth and nose.   People who havea weakened immune system (immunocompromised).  People who smoke.  People whotravel to areas where pneumonia-causing germs commonly exist.  People whoare around animal habitats or animals that have pneumonia-causing germs, including birds, bats, rabbits, cats, and farm animals. SYMPTOMS Symptoms of this condition include:  Adry cough.  A wet (productive) cough.  Fever.  Sweating.  Chest pain, especially when breathing deeply or coughing.  Rapid breathing or difficulty breathing.  Shortness of breath.  Shaking chills.  Fatigue.  Muscle aches. DIAGNOSIS Your health care provider will take a medical history and perform a physical exam. You may also have other tests, including:  Imaging studies of your chest, including  X-rays.  Tests to check your blood oxygen level and other blood gases.  Other tests on blood, mucus (sputum), fluid around your lungs (pleural fluid), and urine. If your pneumonia is severe, other tests may be done to identify the specific cause of your illness. TREATMENT The type of treatment that you receive depends on many factors, such as the cause of your pneumonia, the medicines you take, and other medical conditions that you have. For most adults, treatment and recovery from pneumonia may occur at home. In some cases, treatment must happen in a hospital. Treatment may include:  Antibiotic medicines, if the pneumonia was caused by bacteria.  Antiviral medicines, if the pneumonia was caused by a virus.  Medicines that are given by mouth or through an IV tube.  Oxygen.  Respiratory therapy. Although rare, treating severe pneumonia may include:  Mechanical ventilation. This is done if you are not breathing well on your own and you cannot maintain a safe blood oxygen level.  Thoracentesis. This procedureremoves fluid around one lung or both lungs to help you breathe better. HOME CARE INSTRUCTIONS  Take over-the-counter and prescription medicines only as told by your health care provider.  Only takecough medicine if you are losing sleep. Understand that cough medicine can prevent your body's natural ability to remove mucus from your lungs.  If you were prescribed an antibiotic medicine, take it as told by your health care provider. Do not stop taking the antibiotic even if you start to feel better.  Sleep in a semi-upright position at night. Try sleeping in a reclining chair, or place a few pillows under your head.  Do not use tobacco products, including cigarettes, chewing tobacco, and e-cigarettes. If you need help quitting, ask your health care provider.  Drink enough water to keep your urine  clear or pale yellow. This will help to thin out mucus secretions in your  lungs. PREVENTION There are ways that you can decrease your risk of developing community-acquired pneumonia. Consider getting a pneumococcal vaccine if:  You are older than 47 years of age.  You are older than 46 years of age and are undergoing cancer treatment, have chronic lung disease, or have other medical conditions that affect your immune system. Ask your health care provider if this applies to you. There are different types and schedules of pneumococcal vaccines. Ask your health care provider which vaccination option is best for you. You may also prevent community-acquired pneumonia if you take these actions:  Get an influenza vaccine every year. Ask your health care provider which type of influenza vaccine is best for you.  Go to the dentist on a regular basis.  Wash your hands often. Use hand sanitizer if soap and water are not available. SEEK MEDICAL CARE IF:  You have a fever.  You are losing sleep because you cannot control your cough with cough medicine. SEEK IMMEDIATE MEDICAL CARE IF:  You have worsening shortness of breath.  You have increased chest pain.  Your sickness becomes worse, especially if you are an older adult or have a weakened immune system.  You cough up blood.   This information is not intended to replace advice given to you by your health care provider. Make sure you discuss any questions you have with your health care provider.   Document Released: 10/20/2005 Document Revised: 07/11/2015 Document Reviewed: 02/14/2015 Elsevier Interactive Patient Education 2016 [employer].  Hypertension Hypertension, commonly called high blood pressure, is when the force of blood pumping through your arteries is too strong. Your arteries are the blood vessels that carry blood from your heart throughout your body. A blood pressure reading consists of a higher number over a lower number, such as 110/72. The higher number (systolic) is the pressure inside your  arteries when your heart pumps. The lower number (diastolic) is the pressure inside your arteries when your heart relaxes. Ideally you want your blood pressure below 120/80. Hypertension forces your heart to work harder to pump blood. Your arteries may become narrow or stiff. Having untreated or uncontrolled hypertension can cause heart attack, stroke, kidney disease, and other problems. RISK FACTORS Some risk factors for high blood pressure are controllable. Others are not.  Risk factors you cannot control include:   Race. You may be at higher risk if you are African American.  Age. Risk increases with age.  Gender. Men are at higher risk than women before age 17 years. After age 55, women are at higher risk than men. Risk factors you can control include:  Not getting enough exercise or physical activity.  Being overweight.  Getting too much fat, sugar, calories, or salt in your diet.  Drinking too much alcohol. SIGNS AND SYMPTOMS Hypertension does not usually cause signs or symptoms. Extremely high blood pressure (hypertensive crisis) may cause headache, anxiety, shortness of breath, and nosebleed. DIAGNOSIS To check if you have hypertension, your health care provider will measure your blood pressure while you are seated, with your arm held at the level of your heart. It should be measured at least twice using the same arm. Certain conditions can cause a difference in blood pressure between your right and left arms. A blood pressure reading that is higher than normal on one occasion does not mean that you need treatment. If it is not clear whether you  have high blood pressure, you may be asked to return on a different day to have your blood pressure checked again. Or, you may be asked to monitor your blood pressure at home for 1 or more weeks. TREATMENT Treating high blood pressure includes making lifestyle changes and possibly taking medicine. Living a healthy lifestyle can help lower  high blood pressure. You may need to change some of your habits. Lifestyle changes may include:  Following the DASH diet. This diet is high in fruits, vegetables, and whole grains. It is low in salt, red meat, and added sugars.  Keep your sodium intake below 2,300 mg per day.  Getting at least 30-45 minutes of aerobic exercise at least 4 times per week.  Losing weight if necessary.  Not smoking.  Limiting alcoholic beverages.  Learning ways to reduce stress. Your health care provider may prescribe medicine if lifestyle changes are not enough to get your blood pressure under control, and if one of the following is true:  You are 57-12 years of age and your systolic blood pressure is above 140.  You are 9 years of age or older, and your systolic blood pressure is above 150.  Your diastolic blood pressure is above 90.  You have diabetes, and your systolic blood pressure is over 140 or your diastolic blood pressure is over 90.  You have kidney disease and your blood pressure is above 140/90.  You have heart disease and your blood pressure is above 140/90. Your personal target blood pressure may vary depending on your medical conditions, your age, and other factors. HOME CARE INSTRUCTIONS  Have your blood pressure rechecked as directed by your health care provider.   Take medicines only as directed by your health care provider. Follow the directions carefully. Blood pressure medicines must be taken as prescribed. The medicine does not work as well when you skip doses. Skipping doses also puts you at risk for problems.  Do not smoke.   Monitor your blood pressure at home as directed by your health care provider. SEEK MEDICAL CARE IF:   You think you are having a reaction to medicines taken.  You have recurrent headaches or feel dizzy.  You have swelling in your ankles.  You have trouble with your vision. SEEK IMMEDIATE MEDICAL CARE IF:  You develop a severe headache or  confusion.  You have unusual weakness, numbness, or feel faint.  You have severe chest or abdominal pain.  You vomit repeatedly.  You have trouble breathing. MAKE SURE YOU:   Understand these instructions.  Will watch your condition.  Will get help right away if you are not doing well or get worse.   This information is not intended to replace advice given to you by your health care provider. Make sure you discuss any questions you have with your health care provider.   Document Released: 10/20/2005 Document Revised: 03/06/2015 Document Reviewed: 08/12/2013 Elsevier Interactive Patient Education [employer]

## 2015-10-18 NOTE — ED Notes (Signed)
IV attempt x2, another RN to attempt.   

## 2015-11-04 DIAGNOSIS — J189 Pneumonia, unspecified organism: Secondary | ICD-10-CM

## 2015-11-04 HISTORY — DX: Pneumonia, unspecified organism: J18.9

## 2016-03-19 ENCOUNTER — Inpatient Hospital Stay (HOSPITAL_COMMUNITY)
Admission: EM | Admit: 2016-03-19 | Discharge: 2016-03-26 | DRG: 871 | Disposition: A | Discharge status: Home / Self Care | Payer: BLUE CROSS/BLUE SHIELD | Attending: Internal Medicine | Admitting: Internal Medicine

## 2016-03-19 ENCOUNTER — Encounter (HOSPITAL_COMMUNITY): Payer: Self-pay | Admitting: Family Medicine

## 2016-03-19 ENCOUNTER — Emergency Department (HOSPITAL_COMMUNITY): Payer: BLUE CROSS/BLUE SHIELD

## 2016-03-19 DIAGNOSIS — Z91128 Patient's intentional underdosing of medication regimen for other reason: Secondary | ICD-10-CM | POA: Diagnosis not present

## 2016-03-19 DIAGNOSIS — I152 Hypertension secondary to endocrine disorders: Secondary | ICD-10-CM | POA: Diagnosis present

## 2016-03-19 DIAGNOSIS — J180 Bronchopneumonia, unspecified organism: Secondary | ICD-10-CM | POA: Diagnosis present

## 2016-03-19 DIAGNOSIS — I5040 Unspecified combined systolic (congestive) and diastolic (congestive) heart failure: Secondary | ICD-10-CM | POA: Diagnosis present

## 2016-03-19 DIAGNOSIS — I480 Paroxysmal atrial fibrillation: Secondary | ICD-10-CM | POA: Insufficient documentation

## 2016-03-19 DIAGNOSIS — R7989 Other specified abnormal findings of blood chemistry: Secondary | ICD-10-CM | POA: Diagnosis present

## 2016-03-19 DIAGNOSIS — Z79899 Other long term (current) drug therapy: Secondary | ICD-10-CM | POA: Diagnosis not present

## 2016-03-19 DIAGNOSIS — H538 Other visual disturbances: Secondary | ICD-10-CM

## 2016-03-19 DIAGNOSIS — I43 Cardiomyopathy in diseases classified elsewhere: Secondary | ICD-10-CM | POA: Diagnosis present

## 2016-03-19 DIAGNOSIS — I2510 Atherosclerotic heart disease of native coronary artery without angina pectoris: Secondary | ICD-10-CM | POA: Diagnosis not present

## 2016-03-19 DIAGNOSIS — I10 Essential (primary) hypertension: Secondary | ICD-10-CM | POA: Diagnosis not present

## 2016-03-19 DIAGNOSIS — B348 Other viral infections of unspecified site: Secondary | ICD-10-CM | POA: Diagnosis not present

## 2016-03-19 DIAGNOSIS — I248 Other forms of acute ischemic heart disease: Secondary | ICD-10-CM | POA: Diagnosis present

## 2016-03-19 DIAGNOSIS — R9431 Abnormal electrocardiogram [ECG] [EKG]: Secondary | ICD-10-CM | POA: Diagnosis not present

## 2016-03-19 DIAGNOSIS — R0600 Dyspnea, unspecified: Secondary | ICD-10-CM

## 2016-03-19 DIAGNOSIS — Z6841 Body Mass Index (BMI) 40.0 and over, adult: Secondary | ICD-10-CM | POA: Diagnosis not present

## 2016-03-19 DIAGNOSIS — I5020 Unspecified systolic (congestive) heart failure: Secondary | ICD-10-CM | POA: Diagnosis not present

## 2016-03-19 DIAGNOSIS — R9439 Abnormal result of other cardiovascular function study: Secondary | ICD-10-CM | POA: Clinically undetermined

## 2016-03-19 DIAGNOSIS — R931 Abnormal findings on diagnostic imaging of heart and coronary circulation: Secondary | ICD-10-CM | POA: Diagnosis not present

## 2016-03-19 DIAGNOSIS — R000 Tachycardia, unspecified: Secondary | ICD-10-CM | POA: Diagnosis not present

## 2016-03-19 DIAGNOSIS — R05 Cough: Secondary | ICD-10-CM | POA: Diagnosis not present

## 2016-03-19 DIAGNOSIS — Z87891 Personal history of nicotine dependence: Secondary | ICD-10-CM | POA: Diagnosis not present

## 2016-03-19 DIAGNOSIS — I110 Hypertensive heart disease with heart failure: Secondary | ICD-10-CM | POA: Diagnosis present

## 2016-03-19 DIAGNOSIS — R6510 Systemic inflammatory response syndrome (SIRS) of non-infectious origin without acute organ dysfunction: Secondary | ICD-10-CM | POA: Diagnosis present

## 2016-03-19 DIAGNOSIS — J189 Pneumonia, unspecified organism: Secondary | ICD-10-CM | POA: Diagnosis not present

## 2016-03-19 DIAGNOSIS — I509 Heart failure, unspecified: Secondary | ICD-10-CM

## 2016-03-19 DIAGNOSIS — R0602 Shortness of breath: Secondary | ICD-10-CM | POA: Diagnosis not present

## 2016-03-19 DIAGNOSIS — A419 Sepsis, unspecified organism: Principal | ICD-10-CM | POA: Diagnosis present

## 2016-03-19 DIAGNOSIS — I4891 Unspecified atrial fibrillation: Secondary | ICD-10-CM | POA: Diagnosis present

## 2016-03-19 DIAGNOSIS — I5032 Chronic diastolic (congestive) heart failure: Secondary | ICD-10-CM | POA: Diagnosis present

## 2016-03-19 DIAGNOSIS — R778 Other specified abnormalities of plasma proteins: Secondary | ICD-10-CM | POA: Diagnosis present

## 2016-03-19 DIAGNOSIS — R059 Cough, unspecified: Secondary | ICD-10-CM | POA: Diagnosis present

## 2016-03-19 DIAGNOSIS — E1159 Type 2 diabetes mellitus with other circulatory complications: Secondary | ICD-10-CM | POA: Diagnosis present

## 2016-03-19 DIAGNOSIS — Z885 Allergy status to narcotic agent status: Secondary | ICD-10-CM | POA: Diagnosis not present

## 2016-03-19 DIAGNOSIS — I5030 Unspecified diastolic (congestive) heart failure: Secondary | ICD-10-CM

## 2016-03-19 DIAGNOSIS — R0682 Tachypnea, not elsewhere classified: Secondary | ICD-10-CM

## 2016-03-19 HISTORY — DX: Personal history of nicotine dependence: Z87.891

## 2016-03-19 HISTORY — DX: Morbid (severe) obesity due to excess calories: E66.01

## 2016-03-19 HISTORY — DX: Pneumonia, unspecified organism: J18.9

## 2016-03-19 HISTORY — DX: Essential (primary) hypertension: I10

## 2016-03-19 LAB — BASIC METABOLIC PANEL
Anion gap: 12 (ref 5–15)
BUN: 10 mg/dL (ref 6–20)
CALCIUM: 8.6 mg/dL — AB (ref 8.9–10.3)
CO2: 24 mmol/L (ref 22–32)
CREATININE: 1.03 mg/dL (ref 0.61–1.24)
Chloride: 102 mmol/L (ref 101–111)
Glucose, Bld: 112 mg/dL — ABNORMAL HIGH (ref 65–99)
Potassium: 3.8 mmol/L (ref 3.5–5.1)
SODIUM: 138 mmol/L (ref 135–145)

## 2016-03-19 LAB — RAPID URINE DRUG SCREEN, HOSP PERFORMED
Amphetamines: NOT DETECTED
Barbiturates: NOT DETECTED
Benzodiazepines: NOT DETECTED
COCAINE: NOT DETECTED
OPIATES: NOT DETECTED
TETRAHYDROCANNABINOL: NOT DETECTED

## 2016-03-19 LAB — CBC
HCT: 46 % (ref 39.0–52.0)
Hemoglobin: 15.6 g/dL (ref 13.0–17.0)
MCH: 32.4 pg (ref 26.0–34.0)
MCHC: 33.9 g/dL (ref 30.0–36.0)
MCV: 95.6 fL (ref 78.0–100.0)
PLATELETS: 263 10*3/uL (ref 150–400)
RBC: 4.81 MIL/uL (ref 4.22–5.81)
RDW: 13.9 % (ref 11.5–15.5)
WBC: 14.6 10*3/uL — AB (ref 4.0–10.5)

## 2016-03-19 LAB — I-STAT VENOUS BLOOD GAS, ED
BICARBONATE: 27.4 meq/L — AB (ref 20.0–24.0)
O2 Saturation: 54 %
PH VEN: 7.32 — AB (ref 7.250–7.300)
TCO2: 29 mmol/L (ref 0–100)
pCO2, Ven: 53.1 mmHg — ABNORMAL HIGH (ref 45.0–50.0)
pO2, Ven: 31 mmHg (ref 31.0–45.0)

## 2016-03-19 LAB — HEPATIC FUNCTION PANEL
ALBUMIN: 3.3 g/dL — AB (ref 3.5–5.0)
ALK PHOS: 110 U/L (ref 38–126)
ALT: 21 U/L (ref 17–63)
AST: 23 U/L (ref 15–41)
BILIRUBIN DIRECT: 0.2 mg/dL (ref 0.1–0.5)
BILIRUBIN INDIRECT: 0.7 mg/dL (ref 0.3–0.9)
BILIRUBIN TOTAL: 0.9 mg/dL (ref 0.3–1.2)
Total Protein: 7.4 g/dL (ref 6.5–8.1)

## 2016-03-19 LAB — URINALYSIS, ROUTINE W REFLEX MICROSCOPIC
Bilirubin Urine: NEGATIVE
GLUCOSE, UA: NEGATIVE mg/dL
Hgb urine dipstick: NEGATIVE
KETONES UR: NEGATIVE mg/dL
LEUKOCYTES UA: NEGATIVE
NITRITE: NEGATIVE
PH: 7.5 (ref 5.0–8.0)
Protein, ur: NEGATIVE mg/dL
SPECIFIC GRAVITY, URINE: 1.015 (ref 1.005–1.030)

## 2016-03-19 LAB — I-STAT TROPONIN, ED: TROPONIN I, POC: 0.1 ng/mL — AB (ref 0.00–0.08)

## 2016-03-19 LAB — TSH: TSH: 0.777 u[IU]/mL (ref 0.350–4.500)

## 2016-03-19 LAB — D-DIMER, QUANTITATIVE (NOT AT ARMC): D DIMER QUANT: 0.45 ug{FEU}/mL (ref 0.00–0.50)

## 2016-03-19 LAB — TROPONIN I: TROPONIN I: 0.08 ng/mL — AB (ref <0.031)

## 2016-03-19 LAB — I-STAT CG4 LACTIC ACID, ED
Lactic Acid, Venous: 1.56 mmol/L (ref 0.5–2.0)
Lactic Acid, Venous: 1.17 mmol/L (ref 0.5–2.0)

## 2016-03-19 LAB — BRAIN NATRIURETIC PEPTIDE: B Natriuretic Peptide: 326.2 pg/mL — ABNORMAL HIGH (ref 0.0–100.0)

## 2016-03-19 LAB — PROTIME-INR
INR: 1.15 (ref 0.00–1.49)
Prothrombin Time: 14.9 seconds (ref 11.6–15.2)

## 2016-03-19 MED ORDER — CEFTRIAXONE SODIUM 1 G IJ SOLR
1.0000 g | Freq: Once | INTRAMUSCULAR | Status: AC
Start: 2016-03-19 — End: 2016-03-19
  Administered 2016-03-19: 1 g via INTRAVENOUS
  Filled 2016-03-19: qty 10

## 2016-03-19 MED ORDER — SODIUM CHLORIDE 0.9 % IV BOLUS (SEPSIS): 1000.0000 mL | Freq: Once | INTRAVENOUS | Status: DC

## 2016-03-19 MED ORDER — SODIUM CHLORIDE 0.9% FLUSH
3.0000 mL | Freq: Two times a day (BID) | INTRAVENOUS | Status: DC
  Administered 2016-03-19 – 2016-03-25 (×9): 3 mL via INTRAVENOUS

## 2016-03-19 MED ORDER — DEXTROSE 5 % IV SOLN
2.0000 g | INTRAVENOUS | Status: AC
  Administered 2016-03-20 – 2016-03-25 (×6): 2 g via INTRAVENOUS
  Filled 2016-03-19 (×8): qty 2

## 2016-03-19 MED ORDER — ASPIRIN EC 81 MG PO TBEC: 81.0000 mg | DELAYED_RELEASE_TABLET | Freq: Every day | ORAL | Status: DC

## 2016-03-19 MED ORDER — SODIUM CHLORIDE 0.9 % IV BOLUS (SEPSIS)
1000.0000 mL | Freq: Once | INTRAVENOUS | Status: AC
  Administered 2016-03-19: 1000 mL via INTRAVENOUS

## 2016-03-19 MED ORDER — IOPAMIDOL (ISOVUE-370) INJECTION 76%
100.0000 mL | Freq: Once | INTRAVENOUS | Status: AC | PRN
  Administered 2016-03-19: 100 mL via INTRAVENOUS

## 2016-03-19 MED ORDER — ASPIRIN 81 MG PO CHEW
324.0000 mg | CHEWABLE_TABLET | Freq: Once | ORAL | Status: AC
  Administered 2016-03-19: 324 mg via ORAL
  Filled 2016-03-19: qty 4

## 2016-03-19 MED ORDER — IPRATROPIUM BROMIDE 0.02 % IN SOLN
0.5000 mg | Freq: Four times a day (QID) | RESPIRATORY_TRACT | Status: DC
  Administered 2016-03-19: 0.5 mg via RESPIRATORY_TRACT
  Filled 2016-03-19: qty 2.5

## 2016-03-19 MED ORDER — METOPROLOL TARTRATE 12.5 MG HALF TABLET
12.5000 mg | ORAL_TABLET | Freq: Two times a day (BID) | ORAL | Status: DC
  Administered 2016-03-19 – 2016-03-20 (×2): 12.5 mg via ORAL
  Filled 2016-03-19 (×2): qty 1

## 2016-03-19 MED ORDER — DEXTROSE 5 % IV SOLN
500.0000 mg | INTRAVENOUS | Status: DC
  Administered 2016-03-20 – 2016-03-22 (×3): 500 mg via INTRAVENOUS
  Filled 2016-03-19 (×5): qty 500

## 2016-03-19 MED ORDER — HEPARIN BOLUS VIA INFUSION
4000.0000 [IU] | Freq: Once | INTRAVENOUS | Status: AC
  Administered 2016-03-19: 4000 [IU] via INTRAVENOUS
  Filled 2016-03-19: qty 4000

## 2016-03-19 MED ORDER — ACETAMINOPHEN 500 MG PO TABS
1000.0000 mg | ORAL_TABLET | Freq: Once | ORAL | Status: AC
  Administered 2016-03-19: 1000 mg via ORAL
  Filled 2016-03-19: qty 2

## 2016-03-19 MED ORDER — ASPIRIN EC 81 MG PO TBEC
81.0000 mg | DELAYED_RELEASE_TABLET | Freq: Every day | ORAL | Status: DC
Start: 2016-03-20 — End: 2016-03-26
  Administered 2016-03-20 – 2016-03-26 (×7): 81 mg via ORAL
  Filled 2016-03-19 (×7): qty 1

## 2016-03-19 MED ORDER — GUAIFENESIN ER 600 MG PO TB12
600.0000 mg | ORAL_TABLET | Freq: Two times a day (BID) | ORAL | Status: DC
  Administered 2016-03-19 – 2016-03-26 (×14): 600 mg via ORAL
  Filled 2016-03-19 (×14): qty 1

## 2016-03-19 MED ORDER — ALBUTEROL SULFATE HFA 108 (90 BASE) MCG/ACT IN AERS
4.0000 | INHALATION_SPRAY | Freq: Once | RESPIRATORY_TRACT | Status: AC
Start: 2016-03-19 — End: 2016-03-19
  Administered 2016-03-19: 4 via RESPIRATORY_TRACT
  Filled 2016-03-19: qty 6.7

## 2016-03-19 MED ORDER — ONDANSETRON HCL 4 MG PO TABS: 4.0000 mg | ORAL_TABLET | Freq: Four times a day (QID) | ORAL | Status: DC | PRN

## 2016-03-19 MED ORDER — ACETAMINOPHEN 325 MG PO TABS: 650.0000 mg | ORAL_TABLET | Freq: Four times a day (QID) | ORAL | Status: DC | PRN

## 2016-03-19 MED ORDER — AMLODIPINE BESYLATE 5 MG PO TABS
5.0000 mg | ORAL_TABLET | Freq: Every day | ORAL | Status: DC
  Administered 2016-03-19 – 2016-03-26 (×8): 5 mg via ORAL
  Filled 2016-03-19 (×8): qty 1

## 2016-03-19 MED ORDER — IPRATROPIUM BROMIDE 0.02 % IN SOLN: 0.5000 mg | Freq: Three times a day (TID) | RESPIRATORY_TRACT | Status: DC

## 2016-03-19 MED ORDER — DEXTROSE 5 % IV SOLN
500.0000 mg | Freq: Once | INTRAVENOUS | Status: AC
  Administered 2016-03-19: 500 mg via INTRAVENOUS
  Filled 2016-03-19: qty 500

## 2016-03-19 MED ORDER — DEXTROSE 5 % IV SOLN: 1.0000 g | INTRAVENOUS | Status: DC

## 2016-03-19 MED ORDER — ACETAMINOPHEN 650 MG RE SUPP: 650.0000 mg | Freq: Four times a day (QID) | RECTAL | Status: DC | PRN

## 2016-03-19 MED ORDER — LEVALBUTEROL HCL 0.63 MG/3ML IN NEBU
0.6300 mg | INHALATION_SOLUTION | RESPIRATORY_TRACT | Status: DC | PRN
  Administered 2016-03-20: 0.63 mg via RESPIRATORY_TRACT
  Filled 2016-03-19: qty 3

## 2016-03-19 MED ORDER — ONDANSETRON HCL 4 MG/2ML IJ SOLN: 4.0000 mg | Freq: Four times a day (QID) | INTRAMUSCULAR | Status: DC | PRN

## 2016-03-19 MED ORDER — HYDRALAZINE HCL 20 MG/ML IJ SOLN
10.0000 mg | Freq: Once | INTRAMUSCULAR | Status: AC
  Administered 2016-03-19: 10 mg via INTRAVENOUS
  Filled 2016-03-19: qty 1

## 2016-03-19 MED ORDER — HEPARIN (PORCINE) IN NACL 100-0.45 UNIT/ML-% IJ SOLN
2400.0000 [IU]/h | INTRAMUSCULAR | Status: DC
  Administered 2016-03-19: 1700 [IU]/h via INTRAVENOUS
  Administered 2016-03-20 (×2): 2100 [IU]/h via INTRAVENOUS
  Administered 2016-03-21: 2400 [IU]/h via INTRAVENOUS
  Filled 2016-03-19 (×4): qty 250

## 2016-03-19 MED ORDER — BENZONATATE 100 MG PO CAPS
200.0000 mg | ORAL_CAPSULE | Freq: Once | ORAL | Status: AC
  Administered 2016-03-19: 200 mg via ORAL
  Filled 2016-03-19: qty 2

## 2016-03-19 NOTE — Progress Notes (Signed)
ANTICOAGULATION CONSULT NOTE - Initial Consult  Pharmacy Consult for heparin Indication: atrial fibrillation  Allergies  Allergen Reactions  . Morphine And Related Itching    Patient Measurements: Height: 6\' 3"  (190.5 cm) Weight: (!) 349 lb (158.305 kg) IBW/kg (Calculated) : 84.5 Heparin Dosing Weight: 121kg  Vital Signs: Temp: 99 F (37.2 C) (05/17 2128) BP: 161/115 mmHg (05/17 2129) Pulse Rate: 91 (05/17 2129)  Labs:  Recent Labs  03/19/16 1707 03/19/16 2010  HGB 15.6  --   HCT 46.0  --   PLT 263  --   LABPROT  --  14.9  INR  --  1.15  CREATININE 1.03  --   TROPONINI  --  0.08*    Estimated Creatinine Clearance: 141.4 mL/min (by C-G formula based on Cr of 1.03).   Assessment: 17 YOM here with new onset AFib. Not on anticoagulation PTA. Baseline hgb 15.6, plts 263, no bleeding noted. Noted positive troponins as well.  Goal of Therapy:  Heparin level 0.3-0.7 units/ml Monitor platelets by anticoagulation protocol: Yes   Plan:  -heparin bolus with 4000 units IV x1, then start infusion at 1700 units/hr -first heparin level with AM labs -daily HL and CBC -follow for cardiology and long term AC plans  Hermine Feria D. Romina Divirgilio, PharmD, BCPS Clinical Pharmacist Pager: [phone] 03/19/2016 10:37 PM

## 2016-03-19 NOTE — ED Provider Notes (Signed)
I saw and evaluated the patient, reviewed the resident's note and I agree with the findings and plan. Please see associated encounter note.  #1  EKG Interpretation  Date/Time:  Wednesday Mar 19 2016 16:50:23 EDT Ventricular Rate:  109 PR Interval:    QRS Duration: 102 QT Interval:  354 QTC Calculation: 476 R Axis:   82 Text Interpretation:  Atrial fibrillation with rapid ventricular response  Otherwise no significant change Abnormal ECG Reconfirmed by Ysabel Cowgill MD,  Willian Donson [phone]) on 03/19/2016 6:14:24 PM    #2  EKG Interpretation  Date/Time:  Wednesday Mar 19 2016 18:10:08 EDT Ventricular Rate:  81 PR Interval:  147 QRS Duration: 111 QT Interval:  387 QTC Calculation: 449 R Axis:   85 Text Interpretation:  Sinus rhythm Probable left atrial enlargement Borderline repolarization abnormality persists Atrial fibrillation RESOLVED SINCE PREVIOUS Confirmed by Wilmarie Sparlin MD, Reuel Boom [phone]) on 03/19/2016 6:15:45 PM       50 y.o. male presents with URI symptoms, worsening shortness of breath, now has positive troponin without significant EKG changes other than new onset A-fib that is likely 2/2 infection. Will treat empirically for sepsis with leukocytosis and tachypnea/tachycardia, presumed source of atypical pneumonia. Admit to hospitalist. tachypnic on exam but otherwise NAD.   Lyndal Pulley, MD 03/20/16 [phone]

## 2016-03-19 NOTE — H&P (Signed)
[name]:[id number] DOB: [birth year] DOA: 03/19/2016     PCP: [name], MD   Outpatient Specialists: none  Patient coming from: home  Chief Complaint: cough, shortness of breath  HPI: Luis [name] is a 50 y.o. male with medical history significant of HTN     Presented with 1 week hx of cough with green sputum. His SOB have gotten worse and he is now having significan dyspnea. 2-3 months ago was treated for presumed PNA with Doxycycline. He started to have chest tightness and increased work of breathing states occasional mild wheezing.  Reports being aroung his sick grandson.  Denies any fever, no chest pain, no N/V/D  Regarding pertinent Chronic problems: HTN non compliant with medications did not refill after they run out.  Denies hx of DM or CAD   IN ER: Tachycardic up to 112, RR 29, BP 180/115 sating 97% on 2L WBC 14.5 Lactic acid 1.56 CXR no consolidation, possible chronic inflammatory changes?   Hospitalist was called for admission for SIRS possible reactive airway disease exacerbation vs bronchitis  Review of Systems:    Pertinent positives include: shortness of breath at rest. dyspnea on exertion, excess mucus, productive cough,   Constitutional:  No weight loss, night sweats, Fevers, chills, fatigue, weight loss  HEENT:  No headaches, Difficulty swallowing,Tooth/dental problems,Sore throat,  No sneezing, itching, ear ache, nasal congestion, post nasal drip,  Cardio-vascular:  No chest pain, Orthopnea, PND, anasarca, dizziness, palpitations.no Bilateral lower extremity swelling  GI:  No heartburn, indigestion, abdominal pain, nausea, vomiting, diarrhea, change in bowel habits, loss of appetite, melena, blood in stool, hematemesis Resp:    No non-productive cough, No coughing up of blood. No change in color of mucus.No wheezing. Skin:  no rash or lesions. No jaundice GU:  no dysuria, change in color of urine, no urgency or frequency. No  straining to urinate.  No flank pain.  Musculoskeletal:  No joint pain or no joint swelling. No decreased range of motion. No back pain.  Psych:  No change in mood or affect. No depression or anxiety. No memory loss.  Neuro: no localizing neurological complaints, no tingling, no weakness, no double vision, no gait abnormality, no slurred speech, no confusion  As per HPI otherwise 10 point review of systems negative.   Past Medical History: Past Medical History  Diagnosis Date  . Hypertension    History reviewed. No pertinent past surgical history.   Social History:  Ambulatory   Independently  Lives at home  With family     reports that he has quit smoking. He does not have any smokeless tobacco history on file. He reports that he does not drink alcohol. His drug history is not on file.  Allergies:   Allergies  Allergen Reactions  . Morphine And Related Itching       Family History:    Family History  Problem Relation Age of Onset  . Diabetes type II Mother   . Diabetes type II Other   . Stroke Neg Hx   . Cancer Neg Hx     Medications: Prior to Admission medications   Medication Sig Start Date End Date Taking? Authorizing Provider  albuterol (PROVENTIL HFA;VENTOLIN HFA) 108 (90 BASE) MCG/ACT inhaler Inhale 2 puffs into the lungs every 4 (four) hours as needed for wheezing. 11/29/12  Yes Glynn Octave, MD  amLODipine (NORVASC) 5 MG tablet Take 1 tablet (5 mg total) by mouth daily. 10/18/15  Yes Benjiman Core, MD  DM-Doxylamine-Acetaminophen (NYQUIL COLD & FLU PO) Take 15-30 mLs by mouth every 6 (six) hours as needed (for symptoms).   Yes Historical Provider, MD  Phenyleph-CPM-DM-Aspirin (ALKA-SELTZER PLUS COLD & COUGH PO) Take 1-2 tablets by mouth every 6 (six) hours as needed (for symptoms).   Yes Historical Provider, MD  azithromycin (ZITHROMAX) 250 MG tablet Take 1 tablet (250 mg total) by mouth daily. 2 tabs po on day one, then one tablet po once daily on  days 2-5. 10/18/15   Benjiman Core, MD  doxycycline (VIBRAMYCIN) 100 MG capsule Take 1 capsule (100 mg total) by mouth 2 (two) times daily. Patient not taking: Reported on 10/18/2015 11/29/12   Glynn Octave, MD    Physical Exam: Patient Vitals for the past 24 hrs:  BP Temp Pulse Resp SpO2 Weight  03/19/16 1900 (!) 180/115 mmHg - 106 26 97 % -  03/19/16 1830 (!) 172/116 mmHg - 72 (!) 29 97 % -  03/19/16 1731 - - 102 23 95 % -  03/19/16 1730 - - 102 26 96 % -  03/19/16 1729 - - 107 25 96 % -  03/19/16 1728 - - 101 22 96 % -  03/19/16 1727 - - 112 21 97 % -  03/19/16 1715 (!) 156/105 mmHg - 107 - 96 % -  03/19/16 1710 - - - - - (!) 159.893 kg (352 lb 8 oz)  03/19/16 1651 (!) 173/106 mmHg 98.6 F (37 C) 109 26 94 % -    1. General:  in No Acute distress 2. Psychological: Alert and   Oriented 3. Head/ENT:     Dry Mucous Membranes                          Head Non traumatic, neck supple                          Normal  Dentition 4. SKIN:   decreased Skin turgor,  Skin clean Dry and intact no rash 5. Heart: Regular rate and rhythm no  Murmur, Rub or gallop 6. Lungs:   no wheezes occasional crackles   7. Abdomen: Soft, non-tender, Non distended 8. Lower extremities: no clubbing, cyanosis, or edema 9. Neurologically Grossly intact, moving all 4 extremities equally 10. MSK: Normal range of motion   body mass index is unknown because there is no height on file.  Labs on Admission:   Labs on Admission: I have personally reviewed following labs and imaging studies  CBC:  Recent Labs Lab 03/19/16 1707  WBC 14.6*  HGB 15.6  HCT 46.0  MCV 95.6  PLT 263   Basic Metabolic Panel:  Recent Labs Lab 03/19/16 1707  NA 138  K 3.8  CL 102  CO2 24  GLUCOSE 112*  BUN 10  CREATININE 1.03  CALCIUM 8.6*   GFR: CrCl cannot be calculated (Unknown ideal weight.). Liver Function Tests:  Recent Labs Lab 03/19/16 1733  AST 23  ALT 21  ALKPHOS 110  BILITOT 0.9  PROT 7.4    ALBUMIN 3.3*   No results for input(s): LIPASE, AMYLASE in the last 168 hours. No results for input(s): AMMONIA in the last 168 hours. Coagulation Profile: No results for input(s): INR, PROTIME in the last 168 hours. Cardiac Enzymes: No results for input(s): CKTOTAL, CKMB, CKMBINDEX, TROPONINI in the last 168 hours. BNP (last 3 results) No results for input(s): PROBNP in the last 8760 hours. HbA1C:  No results for input(s): HGBA1C in the last 72 hours. CBG: No results for input(s): GLUCAP in the last 168 hours. Lipid Profile: No results for input(s): CHOL, HDL, LDLCALC, TRIG, CHOLHDL, LDLDIRECT in the last 72 hours. Thyroid Function Tests: No results for input(s): TSH, T4TOTAL, FREET4, T3FREE, THYROIDAB in the last 72 hours. Anemia Panel: No results for input(s): VITAMINB12, FOLATE, FERRITIN, TIBC, IRON, RETICCTPCT in the last 72 hours. Urine analysis: No results found for: COLORURINE, APPEARANCEUR, LABSPEC, PHURINE, GLUCOSEU, HGBUR, BILIRUBINUR, KETONESUR, PROTEINUR, UROBILINOGEN, NITRITE, LEUKOCYTESUR Sepsis Labs: @LABRCNTIP (procalcitonin:4,lacticidven:4) )No results found for this or any previous visit (from the past 240 hour(s)).      No results found for: HGBA1C  CrCl cannot be calculated (Unknown ideal weight.).  BNP (last 3 results) No results for input(s): PROBNP in the last 8760 hours.   ECG REPORT  Independently reviewed Rate:81  Rhythm: Sinus rhythm  ST&T Change: inverted T waves in diffuse leads QTC 449  Prior EKG showed possible atrial fibrillation we'll continue to monitor  Filed Weights   03/19/16 1710  Weight: 159.893 kg (352 lb 8 oz)     Cultures: No results found for: SDES, SPECREQUEST, CULT, REPTSTATUS   Radiological Exams on Admission: Dg Chest 2 View  03/19/2016  CLINICAL DATA:  Cough with shortness of breath for 3 days EXAM: CHEST  2 VIEW COMPARISON:  October 18, 2015 FINDINGS: Interstitium is mildly prominent in a generalized manner  without edema or consolidation. Heart is upper normal in size with pulmonary vascularity within normal limits. No adenopathy. There is degenerative change in the thoracic spine. IMPRESSION: Interstitium prominent without frank edema or consolidation. Suspect chronic inflammatory type change causing the interstitial prominence. Stable cardiac silhouette. Electronically Signed   By: Bretta Bang III M.D.   On: 03/19/2016 17:53   Ct Angio Chest Pe W/cm &/or Wo Cm  03/19/2016  CLINICAL DATA:  50 year old male with shortness of breath EXAM: CT ANGIOGRAPHY CHEST WITH CONTRAST TECHNIQUE: Multidetector CT imaging of the chest was performed using the standard protocol during bolus administration of intravenous contrast. Multiplanar CT image reconstructions and MIPs were obtained to evaluate the vascular anatomy. CONTRAST:  80 cc Isovue 370 COMPARISON:  Chest radiograph dated 03/19/2016 and chest CT dated 11/29/2012 FINDINGS: There are bibasilar linear consolidative changes as well as nodular densities in the lungs. There is apparent mild thickening of the bronchial walls the lung bases. Findings most compatible with multifocal pneumonia or bronchopneumonia. Clinical correlation is recommended. Scattered areas of nodularity noted in the upper lobes, evaluation is however limited due to respiratory motion artifact. There is no pleural effusion or pneumothorax. The central airways are patent. The thoracic aorta and the origins of the great vessels of the aortic arch appear patent. There is mild prominence of the main pulmonary trunk as well as pulmonary arteries indicative of a degree of pulmonary hypertension. There is no CT evidence of pulmonary embolism. There are bilateral hilar adenopathy. No mediastinal adenopathy. Go there is mild cardiomegaly. No pericardial effusion. Esophagus is grossly unremarkable. No thyroid nodules identified. There is no axillary adenopathy. The chest wall soft tissues appear  unremarkable. There is degenerative changes of the spine. No acute fracture. Review of the MIP images confirms the above findings. IMPRESSION: No CT evidence of pulmonary embolism. Multi focal pneumonia versus bronchopneumonia predominantly involving the lung bases. clinical correlation and follow-up resolution recommended. Electronically Signed   By: Elgie Collard M.D.   On: 03/19/2016 21:14    Chart has been reviewed  Assessment/Plan  50 y.o. male with medical history significant of HTN here with sepsis secondary to community-acquired pneumonia EKG and telemetry showed transient atrial fibrillation patient also had elevated troponin Present on Admission:  CAP - as per results of CT , continue the oxygen cover with antibiotics given increased work of breathing and will observe overnight and step down anticipated being able to transfer to regular floor tomorrow obtain sputum culture  .sepsis/ SIRS (systemic inflammatory response syndrome) (HCC) most likely pulmonary etiology will cover with ROcephin and Azythro, sputum culture, blood culture pending, fluid resuscitation as per protocol  provide PRN xopenex and atrovent given hx of tobacco abuse.  . Elevated troponin - reports chest pressure no chest pain, nonspecific ECG with runs of  A.fib likley demand ischemia, will continue to cycle and admit to stepdown, Cardiology consult in AM . A-fib (HCC) - occasional intermittent new onset paroxysmal, will obtain echo, TSH, cycle CE, given elevated troponin will heparinize for tonight. Start on metoprolol given elevated BP  current CHA2DS2 vasc score   is 1 . Hypertension - untreated, restart Norvasc add metoprolol.  Other plan as per orders.  DVT prophylaxis:    Lovenox     Code Status:  FULL CODE   as per patient    Family Communication:   Family not  at  Bedside    Disposition Plan:    To home once workup is complete and patient is stable   Consults called: emailed cardiology    Admission status:   inpatient        Level of care   SDU      I have spent a total of 56 min on this admission    Martina Brodbeck 03/19/2016, 9:09 PM    [facility]  Pager [phone]   after 2 AM please page floor coverage PA If 7AM-7PM, please contact the day team taking care of the patient  [email or web address]  Password [email or web address]

## 2016-03-19 NOTE — ED Provider Notes (Signed)
CSN: [id number]     Arrival date & time 03/19/16  1645 History   First MD Initiated Contact with Patient 03/19/16 1701     Chief Complaint  Patient presents with  . Shortness of Breath  . Cough     (Consider location/radiation/quality/duration/timing/severity/associated sxs/prior Treatment) Patient is a 50 y.o. male presenting with general illness. The history is provided by the patient.  Illness Location:  Cough, shortness of breath Severity:  Moderate Onset quality:  Gradual Duration:  3 days Timing:  Constant Progression:  Worsening Chronicity:  New Context:  Remote history of smoking. No known history of asthma or COPD. 4 days of cough, congestion. Productive of green sputum. Endorse a shortness of breath. No lower extremity swelling. Exposed to sun who had a cold earlier in the week. No fevers and chills. No chest pain. Associated symptoms: congestion, cough, fatigue and shortness of breath   Associated symptoms: no abdominal pain, no chest pain, no diarrhea, no fever, no headaches, no nausea, no sore throat and no vomiting     Past Medical History  Diagnosis Date  . Hypertension    History reviewed. No pertinent past surgical history. History reviewed. No pertinent family history. Social History  Substance Use Topics  . Smoking status: Former Games developer  . Smokeless tobacco: None  . Alcohol Use: No    Review of Systems  Constitutional: Positive for fatigue. Negative for fever and chills.  HENT: Positive for congestion. Negative for sinus pressure and sore throat.   Respiratory: Positive for cough and shortness of breath.   Cardiovascular: Negative for chest pain and leg swelling.  Gastrointestinal: Negative for nausea, vomiting, abdominal pain and diarrhea.  Musculoskeletal: Negative for back pain.  Neurological: Negative for light-headedness and headaches.  All other systems reviewed and are negative.     Allergies  Morphine and related  Home Medications    Prior to Admission medications   Medication Sig Start Date End Date Taking? Authorizing Provider  albuterol (PROVENTIL HFA;VENTOLIN HFA) 108 (90 BASE) MCG/ACT inhaler Inhale 2 puffs into the lungs every 4 (four) hours as needed for wheezing. 11/29/12  Yes Glynn Octave, MD  amLODipine (NORVASC) 5 MG tablet Take 1 tablet (5 mg total) by mouth daily. 10/18/15  Yes Benjiman Core, MD  DM-Doxylamine-Acetaminophen (NYQUIL COLD & FLU PO) Take 15-30 mLs by mouth every 6 (six) hours as needed (for symptoms).   Yes Historical Provider, MD  Phenyleph-CPM-DM-Aspirin (ALKA-SELTZER PLUS COLD & COUGH PO) Take 1-2 tablets by mouth every 6 (six) hours as needed (for symptoms).   Yes Historical Provider, MD  azithromycin (ZITHROMAX) 250 MG tablet Take 1 tablet (250 mg total) by mouth daily. 2 tabs po on day one, then one tablet po once daily on days 2-5. 10/18/15   Benjiman Core, MD  doxycycline (VIBRAMYCIN) 100 MG capsule Take 1 capsule (100 mg total) by mouth 2 (two) times daily. Patient not taking: Reported on 10/18/2015 11/29/12   Glynn Octave, MD   BP 180/115 mmHg  Pulse 106  Temp(Src) 98.6 F (37 C)  Resp 26  Wt 159.893 kg  SpO2 97% Physical Exam  Constitutional: He is oriented to person, place, and time. He appears well-developed and well-nourished. No distress.  HENT:  Head: Normocephalic and atraumatic.  Mouth/Throat: Mucous membranes are not dry.  Eyes: EOM are normal. Pupils are equal, round, and reactive to light.  Cardiovascular: Regular rhythm and intact distal pulses.  Tachycardia present.   Pulmonary/Chest: Effort normal. Tachypnea noted. No respiratory distress.  Breath  sounds difficult to appreciate secondary to body habitus.  Abdominal: Soft. There is no tenderness.  Obese.  Musculoskeletal: He exhibits no edema.       Right lower leg: He exhibits no swelling and no edema.       Left lower leg: He exhibits no swelling and no edema.  Neurological: He is alert and oriented  to person, place, and time.  Skin: Skin is warm and dry.    ED Course  Procedures (including critical care time) Labs Review Labs Reviewed  BASIC METABOLIC PANEL - Abnormal; Notable for the following:    Glucose, Bld 112 (*)    Calcium 8.6 (*)    All other components within normal limits  CBC - Abnormal; Notable for the following:    WBC 14.6 (*)    All other components within normal limits  HEPATIC FUNCTION PANEL - Abnormal; Notable for the following:    Albumin 3.3 (*)    All other components within normal limits  I-STAT TROPOININ, ED - Abnormal; Notable for the following:    Troponin i, poc 0.10 (*)    All other components within normal limits  CULTURE, BLOOD (ROUTINE X 2)  CULTURE, BLOOD (ROUTINE X 2)  URINE RAPID DRUG SCREEN, HOSP PERFORMED  URINALYSIS, ROUTINE W REFLEX MICROSCOPIC (NOT AT [facility])  I-STAT CG4 LACTIC ACID, ED    Imaging Review Dg Chest 2 View  03/19/2016  CLINICAL DATA:  Cough with shortness of breath for 3 days EXAM: CHEST  2 VIEW COMPARISON:  October 18, 2015 FINDINGS: Interstitium is mildly prominent in a generalized manner without edema or consolidation. Heart is upper normal in size with pulmonary vascularity within normal limits. No adenopathy. There is degenerative change in the thoracic spine. IMPRESSION: Interstitium prominent without frank edema or consolidation. Suspect chronic inflammatory type change causing the interstitial prominence. Stable cardiac silhouette. Electronically Signed   By: Bretta Bang III M.D.   On: 03/19/2016 17:53   I have personally reviewed and evaluated these images and lab results as part of my medical decision-making.   EKG Interpretation   Date/Time:  Wednesday Mar 19 2016 18:10:08 EDT Ventricular Rate:  81 PR Interval:  147 QRS Duration: 111 QT Interval:  387 QTC Calculation: 449 R Axis:   85 Text Interpretation:  Sinus rhythm Probable left atrial enlargement  Borderline repolarization abnormality persists  Atrial fibrillation  RESOLVED SINCE PREVIOUS Confirmed by KNOTT MD, Reuel Boom [phone]) on 03/19/2016  6:15:45 PM      MDM   Final diagnoses:  Cough  Tachycardia  Atrial fibrillation, unspecified type (HCC)  T wave inversion in EKG  Tachypnea  Sepsis, due to unspecified organism (HCC)  Elevated troponin    Code sepsis called for tachypnea, tachycardia, leukocytosis, with likely source of pulmonary. Started IVF. Was never hypotensive here, and lactate <4, so will not give full 30cc/kg IVF bolus. Started CAP antibiotics within 1hr of arrival. Blood cultures sent.   EKG - new onset AFIB, no prior diagnosis. No new ischemic changes compared to prior. Denies CP. Doubt active ACS. Trop elevated - likely from demand ischemia. Given ASA 324.  No personal or family history of pulmonary embolism. Doubt PE given timeline of productive cough associated with development of SOB.   Given tylenol, albuterol, tessalon perles for symptom control.   CXR - No obvious consolidations appreciated. Concern for atypical pneumonia.  Given concern for sepsis with elevated troponin, we'll admit to the hospital for further intervention and observation.  Patient admitted in stable  condition.  Lindalou Hose, MD 03/19/16 1117  Lyndal Pulley, MD 03/20/16 [phone]

## 2016-03-19 NOTE — ED Notes (Signed)
Dr. Adela Glimpse updated about pts troponin.

## 2016-03-19 NOTE — ED Notes (Signed)
Dr. Doutova at bedside.  

## 2016-03-19 NOTE — ED Notes (Addendum)
Pt here for cough with green phlegm and SOB. Pt working to breathe. Sts that he has been around his sick grandson. sts he thought it was a cold but progressively worse and was treated for PNA a few months ago.

## 2016-03-20 ENCOUNTER — Encounter (HOSPITAL_COMMUNITY): Payer: Self-pay

## 2016-03-20 ENCOUNTER — Observation Stay (HOSPITAL_COMMUNITY): Payer: BLUE CROSS/BLUE SHIELD

## 2016-03-20 DIAGNOSIS — I5032 Chronic diastolic (congestive) heart failure: Secondary | ICD-10-CM | POA: Diagnosis not present

## 2016-03-20 DIAGNOSIS — I43 Cardiomyopathy in diseases classified elsewhere: Secondary | ICD-10-CM | POA: Diagnosis present

## 2016-03-20 DIAGNOSIS — R931 Abnormal findings on diagnostic imaging of heart and coronary circulation: Secondary | ICD-10-CM | POA: Diagnosis not present

## 2016-03-20 DIAGNOSIS — R9431 Abnormal electrocardiogram [ECG] [EKG]: Secondary | ICD-10-CM | POA: Diagnosis not present

## 2016-03-20 DIAGNOSIS — I509 Heart failure, unspecified: Secondary | ICD-10-CM | POA: Diagnosis not present

## 2016-03-20 DIAGNOSIS — I248 Other forms of acute ischemic heart disease: Secondary | ICD-10-CM | POA: Diagnosis not present

## 2016-03-20 DIAGNOSIS — R7989 Other specified abnormal findings of blood chemistry: Secondary | ICD-10-CM

## 2016-03-20 DIAGNOSIS — I4891 Unspecified atrial fibrillation: Secondary | ICD-10-CM | POA: Diagnosis not present

## 2016-03-20 DIAGNOSIS — I480 Paroxysmal atrial fibrillation: Secondary | ICD-10-CM | POA: Diagnosis not present

## 2016-03-20 DIAGNOSIS — I2510 Atherosclerotic heart disease of native coronary artery without angina pectoris: Secondary | ICD-10-CM | POA: Diagnosis not present

## 2016-03-20 DIAGNOSIS — J189 Pneumonia, unspecified organism: Secondary | ICD-10-CM | POA: Diagnosis not present

## 2016-03-20 DIAGNOSIS — H538 Other visual disturbances: Secondary | ICD-10-CM | POA: Diagnosis not present

## 2016-03-20 DIAGNOSIS — I10 Essential (primary) hypertension: Secondary | ICD-10-CM | POA: Diagnosis not present

## 2016-03-20 DIAGNOSIS — J180 Bronchopneumonia, unspecified organism: Secondary | ICD-10-CM | POA: Diagnosis not present

## 2016-03-20 DIAGNOSIS — Z91128 Patient's intentional underdosing of medication regimen for other reason: Secondary | ICD-10-CM | POA: Diagnosis not present

## 2016-03-20 DIAGNOSIS — R000 Tachycardia, unspecified: Secondary | ICD-10-CM | POA: Diagnosis present

## 2016-03-20 DIAGNOSIS — Z6841 Body Mass Index (BMI) 40.0 and over, adult: Secondary | ICD-10-CM | POA: Diagnosis not present

## 2016-03-20 DIAGNOSIS — Z87891 Personal history of nicotine dependence: Secondary | ICD-10-CM | POA: Diagnosis not present

## 2016-03-20 DIAGNOSIS — R0602 Shortness of breath: Secondary | ICD-10-CM | POA: Diagnosis not present

## 2016-03-20 DIAGNOSIS — R05 Cough: Secondary | ICD-10-CM | POA: Diagnosis not present

## 2016-03-20 DIAGNOSIS — I110 Hypertensive heart disease with heart failure: Secondary | ICD-10-CM | POA: Diagnosis not present

## 2016-03-20 DIAGNOSIS — I5020 Unspecified systolic (congestive) heart failure: Secondary | ICD-10-CM | POA: Diagnosis not present

## 2016-03-20 DIAGNOSIS — A419 Sepsis, unspecified organism: Principal | ICD-10-CM

## 2016-03-20 DIAGNOSIS — Z885 Allergy status to narcotic agent status: Secondary | ICD-10-CM | POA: Diagnosis not present

## 2016-03-20 DIAGNOSIS — Z79899 Other long term (current) drug therapy: Secondary | ICD-10-CM | POA: Diagnosis not present

## 2016-03-20 DIAGNOSIS — I5040 Unspecified combined systolic (congestive) and diastolic (congestive) heart failure: Secondary | ICD-10-CM | POA: Diagnosis not present

## 2016-03-20 DIAGNOSIS — B348 Other viral infections of unspecified site: Secondary | ICD-10-CM | POA: Diagnosis present

## 2016-03-20 LAB — HEPARIN LEVEL (UNFRACTIONATED)
HEPARIN UNFRACTIONATED: 0.17 [IU]/mL — AB (ref 0.30–0.70)
HEPARIN UNFRACTIONATED: 0.4 [IU]/mL (ref 0.30–0.70)
Heparin Unfractionated: 0.52 IU/mL (ref 0.30–0.70)

## 2016-03-20 LAB — RESPIRATORY PANEL BY PCR
ADENOVIRUS-RVPPCR: NOT DETECTED
Bordetella pertussis: NOT DETECTED
CORONAVIRUS 229E-RVPPCR: NOT DETECTED
CORONAVIRUS HKU1-RVPPCR: NOT DETECTED
CORONAVIRUS OC43-RVPPCR: NOT DETECTED
Chlamydophila pneumoniae: NOT DETECTED
Coronavirus NL63: NOT DETECTED
INFLUENZA A H1 2009-RVPPR: NOT DETECTED
INFLUENZA A H1-RVPPCR: NOT DETECTED
INFLUENZA A H3-RVPPCR: NOT DETECTED
Influenza A: NOT DETECTED
Influenza B: NOT DETECTED
MYCOPLASMA PNEUMONIAE-RVPPCR: NOT DETECTED
Metapneumovirus: NOT DETECTED
PARAINFLUENZA VIRUS 1-RVPPCR: NOT DETECTED
PARAINFLUENZA VIRUS 2-RVPPCR: NOT DETECTED
Parainfluenza Virus 3: NOT DETECTED
Parainfluenza Virus 4: NOT DETECTED
RESPIRATORY SYNCYTIAL VIRUS-RVPPCR: NOT DETECTED
Rhinovirus / Enterovirus: DETECTED — AB

## 2016-03-20 LAB — HIV ANTIBODY (ROUTINE TESTING W REFLEX): HIV SCREEN 4TH GENERATION: NONREACTIVE

## 2016-03-20 LAB — CBC
HCT: 44.3 % (ref 39.0–52.0)
Hemoglobin: 14.5 g/dL (ref 13.0–17.0)
MCH: 31.3 pg (ref 26.0–34.0)
MCHC: 32.7 g/dL (ref 30.0–36.0)
MCV: 95.7 fL (ref 78.0–100.0)
PLATELETS: 255 10*3/uL (ref 150–400)
RBC: 4.63 MIL/uL (ref 4.22–5.81)
RDW: 14.3 % (ref 11.5–15.5)
WBC: 15.2 10*3/uL — AB (ref 4.0–10.5)

## 2016-03-20 LAB — COMPREHENSIVE METABOLIC PANEL
ALT: 19 U/L (ref 17–63)
AST: 18 U/L (ref 15–41)
Albumin: 3.3 g/dL — ABNORMAL LOW (ref 3.5–5.0)
Alkaline Phosphatase: 108 U/L (ref 38–126)
Anion gap: 14 (ref 5–15)
BILIRUBIN TOTAL: 0.8 mg/dL (ref 0.3–1.2)
BUN: 7 mg/dL (ref 6–20)
CO2: 24 mmol/L (ref 22–32)
CREATININE: 1.12 mg/dL (ref 0.61–1.24)
Calcium: 8.4 mg/dL — ABNORMAL LOW (ref 8.9–10.3)
Chloride: 102 mmol/L (ref 101–111)
Glucose, Bld: 128 mg/dL — ABNORMAL HIGH (ref 65–99)
Potassium: 3.5 mmol/L (ref 3.5–5.1)
Sodium: 140 mmol/L (ref 135–145)
TOTAL PROTEIN: 7.3 g/dL (ref 6.5–8.1)

## 2016-03-20 LAB — MAGNESIUM: MAGNESIUM: 2 mg/dL (ref 1.7–2.4)

## 2016-03-20 LAB — PHOSPHORUS: PHOSPHORUS: 3.8 mg/dL (ref 2.5–4.6)

## 2016-03-20 LAB — PROCALCITONIN: PROCALCITONIN: 0.29 ng/mL

## 2016-03-20 LAB — LACTIC ACID, PLASMA: Lactic Acid, Venous: 1.3 mmol/L (ref 0.5–2.0)

## 2016-03-20 LAB — TROPONIN I
Troponin I: 0.08 ng/mL — ABNORMAL HIGH (ref <0.031)
Troponin I: 0.1 ng/mL — ABNORMAL HIGH (ref <0.031)

## 2016-03-20 LAB — MRSA PCR SCREENING: MRSA by PCR: NEGATIVE

## 2016-03-20 LAB — STREP PNEUMONIAE URINARY ANTIGEN: Strep Pneumo Urinary Antigen: NEGATIVE

## 2016-03-20 MED ORDER — IPRATROPIUM BROMIDE 0.02 % IN SOLN
0.5000 mg | Freq: Four times a day (QID) | RESPIRATORY_TRACT | Status: DC
  Administered 2016-03-21 (×4): 0.5 mg via RESPIRATORY_TRACT
  Filled 2016-03-20 (×4): qty 2.5

## 2016-03-20 MED ORDER — HEPARIN BOLUS VIA INFUSION
4000.0000 [IU] | Freq: Once | INTRAVENOUS | Status: AC
  Administered 2016-03-20: 4000 [IU] via INTRAVENOUS
  Filled 2016-03-20: qty 4000

## 2016-03-20 MED ORDER — FUROSEMIDE 10 MG/ML IJ SOLN
40.0000 mg | Freq: Once | INTRAMUSCULAR | Status: AC
  Administered 2016-03-20: 40 mg via INTRAVENOUS
  Filled 2016-03-20: qty 4

## 2016-03-20 MED ORDER — SODIUM CHLORIDE 0.9 % IV SOLN
INTRAVENOUS | Status: DC
  Administered 2016-03-21: 10:00:00 via INTRAVENOUS

## 2016-03-20 MED ORDER — LEVALBUTEROL HCL 1.25 MG/0.5ML IN NEBU
1.2500 mg | INHALATION_SOLUTION | RESPIRATORY_TRACT | Status: DC
  Administered 2016-03-20 (×4): 1.25 mg via RESPIRATORY_TRACT
  Filled 2016-03-20 (×4): qty 0.5

## 2016-03-20 MED ORDER — LEVALBUTEROL HCL 0.63 MG/3ML IN NEBU
0.6300 mg | INHALATION_SOLUTION | Freq: Once | RESPIRATORY_TRACT | Status: AC
  Administered 2016-03-20: 0.63 mg via RESPIRATORY_TRACT
  Filled 2016-03-20: qty 3

## 2016-03-20 MED ORDER — METOPROLOL TARTRATE 25 MG PO TABS
25.0000 mg | ORAL_TABLET | Freq: Two times a day (BID) | ORAL | Status: DC
  Administered 2016-03-20 – 2016-03-26 (×12): 25 mg via ORAL
  Filled 2016-03-20 (×12): qty 1

## 2016-03-20 MED ORDER — IPRATROPIUM BROMIDE 0.02 % IN SOLN
0.5000 mg | RESPIRATORY_TRACT | Status: DC
  Administered 2016-03-20 (×4): 0.5 mg via RESPIRATORY_TRACT
  Filled 2016-03-20 (×4): qty 2.5

## 2016-03-20 MED ORDER — LEVALBUTEROL HCL 1.25 MG/0.5ML IN NEBU
1.2500 mg | INHALATION_SOLUTION | Freq: Four times a day (QID) | RESPIRATORY_TRACT | Status: DC
  Administered 2016-03-21 (×4): 1.25 mg via RESPIRATORY_TRACT
  Filled 2016-03-20 (×4): qty 0.5

## 2016-03-20 MED ORDER — CETYLPYRIDINIUM CHLORIDE 0.05 % MT LIQD
7.0000 mL | Freq: Two times a day (BID) | OROMUCOSAL | Status: DC
  Administered 2016-03-21 – 2016-03-26 (×8): 7 mL via OROMUCOSAL

## 2016-03-20 MED ORDER — HYDRALAZINE HCL 20 MG/ML IJ SOLN
10.0000 mg | Freq: Four times a day (QID) | INTRAMUSCULAR | Status: DC | PRN
  Administered 2016-03-23: 10 mg via INTRAVENOUS
  Filled 2016-03-20: qty 1

## 2016-03-20 NOTE — Consult Note (Signed)
Cardiology Consult    Patient ID: Luis [name] MRN: [id number], DOB/AGE: 48-Feb-1967   Admit date: 03/19/2016 Date of Consult: 03/20/2016  Primary Physician: [name], MD Primary Cardiologist: New Requesting Provider: Judie Petit. Mikhail  Patient Profile    50 y/o ? w/o prior cardiac hx who presented to [facility] on 5/17 with a three day h/o progressive dyspnea and cough and was found to be tachycardic with a mild troponin elevation.  Past Medical History   Past Medical History  Diagnosis Date  . Essential hypertension   . Morbid obesity (HCC)   . History of tobacco abuse     Past Surgical History  Procedure Laterality Date  . Right femur im nailing  2012     Allergies  Allergies  Allergen Reactions  . Morphine And Related Itching    History of Present Illness    50 y/o ? with a h/o HTN, obesity, and tobacco abuse (quit 2016).  He does not routinely see a healthcare provider and was not actively taking any medications at home prior to this admission.  In December of 2016, he was seen in the ED with dyspnea and cough and dx with CAP.  He was hypertensive @ the time and was rx amlodipine along with zithromax.  He was offered admission but left AMA.  He says that he recovered from that pna and returned to his usual activities, which consists mostly of sitting around his home.  He says that he eats all day long and has gained a fair amt of wt over the past six months (looks like it's ~ 10 lbs per our records).  He was in his usoh until about 3 days prior to admission, when he began to experience productive cough (green sputum), malaise, and progressive dyspnea associated with diaphoresis.  He also noted intermittent chest tightness that was worse with coughing.  Due to progression of Ss, he presented to the [facility] ED late on 5/17.  There, he was tachycardic.  There was concern for Afib however ECG shows sinus tach with freq pac's.  He underwent CTA of the Chest, which was negative for  PE but did show multifocal PNA.  He was placed on IV Abx and admitted for further eval.  He has since remained mildly tachycardic and relatively dyspneic.  He continues to note intermittent chest tightness.  His troponin has returned mildly elevated @ 0.08  0.08  0.10.  Inpatient Medications    . amLODipine  5 mg Oral Daily  . antiseptic oral rinse  7 mL Mouth Rinse BID  . aspirin EC  81 mg Oral Daily  . azithromycin  500 mg Intravenous Q24H  . cefTRIAXone (ROCEPHIN)  IV  2 g Intravenous Q24H  . guaiFENesin  600 mg Oral BID  . ipratropium  0.5 mg Nebulization Q4H  . levalbuterol  1.25 mg Nebulization Q4H  . metoprolol tartrate  12.5 mg Oral BID  . sodium chloride  1,000 mL Intravenous Once  . sodium chloride flush  3 mL Intravenous Q12H    Family History    Family History  Problem Relation Age of Onset  . Diabetes type II Mother   . Diabetes type II Other   . Stroke Neg Hx   . Cancer Neg Hx   . Other      never knew father    Social History    Social History   Social History  . Marital Status: Single    Spouse Name: N/A  .  Number of Children: N/A  . Years of Education: N/A   Occupational History  . Not on file.   Social History Main Topics  . Smoking status: Former Smoker -- 0.50 packs/day for 30 years    Types: Cigarettes    Quit date: 03/21/2015  . Smokeless tobacco: Not on file  . Alcohol Use: No  . Drug Use: No  . Sexual Activity: Not on file   Other Topics Concern  . Not on file   Social History Narrative   Lives in [place] with mother and three grandchildren.  Works for [employer] - works from home.  Does not routinely exercise.     Review of Systems    General:  +++ diaphoresis.  No chills, fever, or weight changes.  Cardiovascular:  +++ intermittent chest tightness associated with dyspnea and worse with cough.  No edema, orthopnea, palpitations, paroxysmal nocturnal dyspnea. Dermatological: No rash, lesions/masses Respiratory: +++  productive cough with green sputum, +++ dyspnea Urologic: No hematuria, dysuria Abdominal:   No nausea, vomiting, diarrhea, bright red blood per rectum, melena, or hematemesis Neurologic:  No visual changes, wkns, changes in mental status. All other systems reviewed and are otherwise negative except as noted above.  Physical Exam    Blood pressure 161/91, pulse 107, temperature 98.6 F (37 C), temperature source Oral, resp. rate 31, height 6\' 3"  (1.905 m), weight 343 lb 4.1 oz (155.7 kg), SpO2 98 %.  General: Pleasant, NAD Psych: Normal affect. Neuro: Alert and oriented X 3. Moves all extremities spontaneously. HEENT: Normal  Neck: Supple without bruits.  Obese, difficult to gauge JVP. Lungs:  Resp regular and unlabored, diminished breath sounds bilaterally. Heart: RRR, tachy, no s3, s4, or murmurs. Abdomen: Soft, non-tender, non-distended, BS + x 4.  Extremities: No clubbing, cyanosis or edema. DP/PT/Radials 2+ and equal bilaterally.  Labs    Troponin [facility] Test)  Recent Labs  03/19/16 1710  TROPIPOC 0.10*    Recent Labs  03/19/16 2010 03/20/16 0046 03/20/16 0725  TROPONINI 0.08* 0.08* 0.10*   Lab Results  Component Value Date   WBC 15.2* 03/20/2016   HGB 14.5 03/20/2016   HCT 44.3 03/20/2016   MCV 95.7 03/20/2016   PLT 255 03/20/2016     Recent Labs Lab 03/20/16 0725  NA 140  K 3.5  CL 102  CO2 24  BUN 7  CREATININE 1.12  CALCIUM 8.4*  PROT 7.3  BILITOT 0.8  ALKPHOS 108  ALT 19  AST 18  GLUCOSE 128*   Lab Results  Component Value Date   DDIMER 0.45 03/19/2016     Radiology Studies    Dg Chest 2 View  03/19/2016  CLINICAL DATA:  Cough with shortness of breath for 3 days EXAM: CHEST  2 VIEW COMPARISON:  October 18, 2015 FINDINGS: Interstitium is mildly prominent in a generalized manner without edema or consolidation. Heart is upper normal in size with pulmonary vascularity within normal limits. No adenopathy. There is degenerative  change in the thoracic spine. IMPRESSION: Interstitium prominent without frank edema or consolidation. Suspect chronic inflammatory type change causing the interstitial prominence. Stable cardiac silhouette. Electronically Signed   By: Bretta Bang III M.D.   On: 03/19/2016 17:53   Ct Angio Chest Pe W/cm &/or Wo Cm  03/19/2016  CLINICAL DATA:  50 year old male with shortness of breath EXAM: CT ANGIOGRAPHY CHEST WITH CONTRAST TECHNIQUE: Multidetector CT imaging of the chest was performed using the standard protocol during bolus administration of intravenous  contrast. Multiplanar CT image reconstructions and MIPs were obtained to evaluate the vascular anatomy. CONTRAST:  80 cc Isovue 370 COMPARISON:  Chest radiograph dated 03/19/2016 and chest CT dated 11/29/2012 FINDINGS: There are bibasilar linear consolidative changes as well as nodular densities in the lungs. There is apparent mild thickening of the bronchial walls the lung bases. Findings most compatible with multifocal pneumonia or bronchopneumonia. Clinical correlation is recommended. Scattered areas of nodularity noted in the upper lobes, evaluation is however limited due to respiratory motion artifact. There is no pleural effusion or pneumothorax. The central airways are patent. The thoracic aorta and the origins of the great vessels of the aortic arch appear patent. There is mild prominence of the main pulmonary trunk as well as pulmonary arteries indicative of a degree of pulmonary hypertension. There is no CT evidence of pulmonary embolism. There are bilateral hilar adenopathy. No mediastinal adenopathy. Go there is mild cardiomegaly. No pericardial effusion. Esophagus is grossly unremarkable. No thyroid nodules identified. There is no axillary adenopathy. The chest wall soft tissues appear unremarkable. There is degenerative changes of the spine. No acute fracture. Review of the MIP images confirms the above findings. IMPRESSION: No CT evidence of  pulmonary embolism. Multi focal pneumonia versus bronchopneumonia predominantly involving the lung bases. clinical correlation and follow-up resolution recommended. Electronically Signed   By: Elgie Collard M.D.   On: 03/19/2016 21:14   Dg Chest Port 1 View  03/20/2016  CLINICAL DATA:  50 year old male which shortness of breath EXAM: PORTABLE CHEST 1 VIEW COMPARISON:  Chest CT dated 03/19/2016 and radiograph dated 03/19/2016 FINDINGS: Single portable view of the chest demonstrates areas of airspace density primarily involving the lower lobes, left greater right corresponding to the density seen on the CT. There is no pleural effusion or pneumothorax. Set top-normal cardiac silhouette no acute osseous pathology. IMPRESSION: Bilateral lower lung field of nodular and airspace densities as seen on the prior CT. Follow-up recommended. Electronically Signed   By: Elgie Collard M.D.   On: 03/20/2016 02:00    ECG & Cardiac Imaging    Rsr, 74, lae, lat twi - more pronounced since admission.  Assessment & Plan    1.  Multifocal PNA:  Pt presented with a three day h/o progressive dyspnea, cough, and diaphoresis.  Chest CT showed multifocal PNA.  WBC 15.2.  Currently afebrile.  Abx per IM.  2.  Elevated troponin:  In setting of above, pt has had sinus tachycardia with frequent PAC's and also chest tightness with a pleuritic component - worse with coughing.  His troponin is mildly elevated with a flat trend of 0.08  0.08  0.10.  Suspect demand ischemia in setting of #1.  No prior h/o CAD.  RF include HTN, obesity, and h/o tobacco abuse.  Cont asa,  blocker.  Check lipids.  Check echo to eval EF.  If nl EF w/o wma, plan conservative therapy and consider outpt 2 day myoview once he recovers from respiratory illness.  3.  Essential HTN:  BP elevated.  Prior h/o untreated HTN.  Currently on amlodipine 5 and low dose metoprolol.  Will titrate metoprolol given ongoing tachycardia.  4.  Morbid Obesity: he is  aware that he needs to lose weight but isn't sure how to start.  We discussed calorie reduction and increasing activity.  Will ask nutrition to see.  Signed, Nicolasa Ducking, NP 03/20/2016, 11:59 AM   I have personally seen and examined this patient with Ward Givens, NP. I agree with the  assessment and plan as outlined above. He is admitted with dyspnea and pneumonia. He is being treated with antibiotics. Question of atrial fib on monitor. We cannot find any strips or EKGs with atrial fib. He has sinus tach with PACs. His troponin is mildly elevated at 0.1. I do not think this is representative of ACS. Likely demand ischemia in setting of pneumonia. Will arrange echo to assess LV function. Exam with obese male, CV is regular, tachy with ectopy. Lungs with diminished breath sounds bilaterally. No LE edema. Labs reviewed. EKG reviewed by me.   Verne Carrow 03/20/2016 12:47 PM

## 2016-03-20 NOTE — Progress Notes (Signed)
ANTICOAGULATION CONSULT NOTE - Follow Up Consult  Pharmacy Consult for Heparin Indication: atrial fibrillation  Allergies  Allergen Reactions  . Morphine And Related Itching    Patient Measurements: Height: 6\' 3"  (190.5 cm) Weight: (!) 343 lb 4.1 oz (155.7 kg) IBW/kg (Calculated) : 84.5 Heparin Dosing Weight:  121 kg  Vital Signs: Temp: 99.6 F (37.6 C) (05/18 1236) Temp Source: Oral (05/18 1236) BP: 130/90 mmHg (05/18 1500) Pulse Rate: 100 (05/18 1500)  Labs:  Recent Labs  03/19/16 1707 03/19/16 2010 03/20/16 0046 03/20/16 0336 03/20/16 0725 03/20/16 1008 03/20/16 1557  HGB 15.6  --   --   --  14.5  --   --   HCT 46.0  --   --   --  44.3  --   --   PLT 263  --   --   --  255  --   --   LABPROT  --  14.9  --   --   --   --   --   INR  --  1.15  --   --   --   --   --   HEPARINUNFRC  --   --   --  0.17*  --  0.52 0.40  CREATININE 1.03  --   --   --  1.12  --   --   TROPONINI  --  0.08* 0.08*  --  0.10*  --   --     Estimated Creatinine Clearance: 128.9 mL/min (by C-G formula based on Cr of 1.12).   Assessment: New afib with heparin level 0.52, now 0.4 remains in goal range x 2 levels.  Goal of Therapy:  Heparin level 0.3-0.7 units/ml Monitor platelets by anticoagulation protocol: Yes   Plan:  Continue IV heparin at 2100 units/hr HL and CBC in AM.   Shown Dissinger S. Merilynn Finland, PharmD, BCPS Clinical Staff Pharmacist Pager [phone]  Misty Stanley Stillinger 03/20/2016,4:31 PM

## 2016-03-20 NOTE — Progress Notes (Signed)
ANTICOAGULATION CONSULT NOTE - Follow Up Consult  Pharmacy Consult for Heparin Indication: atrial fibrillation  Allergies  Allergen Reactions  . Morphine And Related Itching    Patient Measurements: Height: 6\' 3"  (190.5 cm) Weight: (!) 343 lb 4.1 oz (155.7 kg) IBW/kg (Calculated) : 84.5 Heparin Dosing Weight:   Vital Signs: Temp: 98.6 F (37 C) (05/18 0842) Temp Source: Oral (05/18 0842) BP: 161/91 mmHg (05/18 0842) Pulse Rate: 107 (05/18 0842)  Labs:  Recent Labs  03/19/16 1707 03/19/16 2010 03/20/16 0046 03/20/16 0336 03/20/16 0725 03/20/16 1008  HGB 15.6  --   --   --  14.5  --   HCT 46.0  --   --   --  44.3  --   PLT 263  --   --   --  255  --   LABPROT  --  14.9  --   --   --   --   INR  --  1.15  --   --   --   --   HEPARINUNFRC  --   --   --  0.17*  --  0.52  CREATININE 1.03  --   --   --  1.12  --   TROPONINI  --  0.08* 0.08*  --  0.10*  --     Estimated Creatinine Clearance: 128.9 mL/min (by C-G formula based on Cr of 1.12).   Medications:  Scheduled:  . amLODipine  5 mg Oral Daily  . antiseptic oral rinse  7 mL Mouth Rinse BID  . aspirin EC  81 mg Oral Daily  . azithromycin  500 mg Intravenous Q24H  . cefTRIAXone (ROCEPHIN)  IV  2 g Intravenous Q24H  . guaiFENesin  600 mg Oral BID  . ipratropium  0.5 mg Nebulization Q4H  . levalbuterol  1.25 mg Nebulization Q4H  . metoprolol tartrate  12.5 mg Oral BID  . sodium chloride  1,000 mL Intravenous Once  . sodium chloride flush  3 mL Intravenous Q12H    Assessment: 50yo male with new AFib, for possible DCCV today.  Heparin level is therapeutic this AM following rate adjustment overnight and bolus.  No bleeding noted.  Hg and pltc wnl.  Goal of Therapy:  Heparin level 0.3-0.7 units/ml Monitor platelets by anticoagulation protocol: Yes   Plan:  Continue Heparin 2100 units/hr Repeat heparin level at 4PM to verify therapeutic Watch for s/s of bleeding  Marisue Humble, PharmD Clinical  Pharmacist [facility]

## 2016-03-20 NOTE — Progress Notes (Signed)
PROGRESS NOTE    Luis [name]  BLT:[id number] DOB: 25-Jan-1966 DOA: 03/19/2016 PCP: [name], MD    Brief Narrative:  50 year old male history of hypertension who presented with 1 week of cough and shortness of breath. Patient was presumably treated for pneumonia 2-3 months ago with doxycycline. Admitted for pneumonia and atrial fibrillation. Cardiology consulted. Assessment & Plan   Sepsis secondary to community-acquired pneumonia -Upon admission, patient was tachycardic, tachypneic, with leukocytosis -CTA chest no evidence of pulmonary embolism, multifocal pneumonia versus bronchopneumonia -Continue azithromycin and ceftriaxone -Sputum culture and blood cultures pending   Sinus tachycardia -Thought to be A. fib however rhythm does not show atrial for, shows sinus rhythm with [facility] -Cardiology consulted and appreciated -Echocardiogram pending -TSH 0.777  Elevated troponin -Likely secondary to the above -Troponin peak at 0.1 -Likely demand ischemia  Essential hypertension -Supposedly patient ran out of his medications -Continue Norvasc and metoprolol  Morbid obesity -Upon discharge patient will need to discuss with his primary care physician last modifications including diet and exercise -BMI 43.7  DVT Prophylaxis  heparin  Code Status: Full  Family Communication: None at bedside  Disposition Plan: Admitted. Continue to monitor in stepdown  Consultants Cardiology   Procedures  none  Antibiotics   Anti-infectives    Start     Dose/Rate Route Frequency Ordered Stop   03/20/16 2200  cefTRIAXone (ROCEPHIN) 1 g in dextrose 5 % 50 mL IVPB  Status:  Discontinued     1 g 100 mL/hr over 30 Minutes Intravenous Every 24 hours 03/19/16 2219 03/19/16 2239   03/20/16 2200  cefTRIAXone (ROCEPHIN) 2 g in dextrose 5 % 50 mL IVPB     2 g 100 mL/hr over 30 Minutes Intravenous Every 24 hours 03/19/16 2239 03/26/16 2159   03/20/16 2000  azithromycin (ZITHROMAX) 500 mg  in dextrose 5 % 250 mL IVPB     500 mg 250 mL/hr over 60 Minutes Intravenous Every 24 hours 03/19/16 2219 03/26/16 1959   03/19/16 1745  cefTRIAXone (ROCEPHIN) 1 g in dextrose 5 % 50 mL IVPB     1 g 100 mL/hr over 30 Minutes Intravenous  Once 03/19/16 1730 03/19/16 1912   03/19/16 1745  azithromycin (ZITHROMAX) 500 mg in dextrose 5 % 250 mL IVPB     500 mg 250 mL/hr over 60 Minutes Intravenous  Once 03/19/16 1730 03/19/16 1943      Subjective:   Luis [name] seen and examined today.   Patient continues complaining of chest tightness and cough. Denies any abdominal pain, nausea or vomiting, diarrhea or constipation.  Objective:   Filed Vitals:   03/20/16 0836 03/20/16 0842 03/20/16 1157 03/20/16 1236  BP:  161/91  116/92  Pulse:  107  104  Temp:  98.6 F (37 C)  99.6 F (37.6 C)  TempSrc:  Oral  Oral  Resp:  31  39  Height:      Weight:      SpO2: 98% 99% 98% 96%    Intake/Output Summary (Last 24 hours) at 03/20/16 1303 Last data filed at 03/20/16 1100  Gross per 24 hour  Intake    895 ml  Output   3900 ml  Net  -3005 ml   Filed Weights   03/19/16 1710 03/19/16 2200 03/20/16 0421  Weight: 159.893 kg (352 lb 8 oz) 158.305 kg (349 lb) 155.7 kg (343 lb 4.1 oz)    Exam  General: Well developed, well nourished, NAD, appears stated age  HEENT: NCAT,mucous membranes  moist.   Cardiovascular: S1 S2 auscultated, Tachycardic, no murmur  Respiratory: Diminished breath sounds, likely due to body habitus  Abdomen: Soft, nontender, nondistended, + bowel sounds  Extremities: warm dry without cyanosis clubbing or edema  Neuro: AAOx3, nonfocal  Psych: Normal affect and demeanor    Data Reviewed: I have personally reviewed following labs and imaging studies  CBC:  Recent Labs Lab 03/19/16 1707 03/20/16 0725  WBC 14.6* 15.2*  HGB 15.6 14.5  HCT 46.0 44.3  MCV 95.6 95.7  PLT 263 255   Basic Metabolic Panel:  Recent Labs Lab 03/19/16 1707 03/20/16 0725    NA 138 140  K 3.8 3.5  CL 102 102  CO2 24 24  GLUCOSE 112* 128*  BUN 10 7  CREATININE 1.03 1.12  CALCIUM 8.6* 8.4*  MG  --  2.0  PHOS  --  3.8   GFR: Estimated Creatinine Clearance: 128.9 mL/min (by C-G formula based on Cr of 1.12). Liver Function Tests:  Recent Labs Lab 03/19/16 1733 03/20/16 0725  AST 23 18  ALT 21 19  ALKPHOS 110 108  BILITOT 0.9 0.8  PROT 7.4 7.3  ALBUMIN 3.3* 3.3*   No results for input(s): LIPASE, AMYLASE in the last 168 hours. No results for input(s): AMMONIA in the last 168 hours. Coagulation Profile:  Recent Labs Lab 03/19/16 2010  INR 1.15   Cardiac Enzymes:  Recent Labs Lab 03/19/16 2010 03/20/16 0046 03/20/16 0725  TROPONINI 0.08* 0.08* 0.10*   BNP (last 3 results) No results for input(s): PROBNP in the last 8760 hours. HbA1C: No results for input(s): HGBA1C in the last 72 hours. CBG: No results for input(s): GLUCAP in the last 168 hours. Lipid Profile: No results for input(s): CHOL, HDL, LDLCALC, TRIG, CHOLHDL, LDLDIRECT in the last 72 hours. Thyroid Function Tests:  Recent Labs  03/19/16 2010  TSH 0.777   Anemia Panel: No results for input(s): VITAMINB12, FOLATE, FERRITIN, TIBC, IRON, RETICCTPCT in the last 72 hours. Urine analysis:    Component Value Date/Time   COLORURINE YELLOW 03/19/2016 2121   APPEARANCEUR CLEAR 03/19/2016 2121   LABSPEC 1.015 03/19/2016 2121   PHURINE 7.5 03/19/2016 2121   GLUCOSEU NEGATIVE 03/19/2016 2121   HGBUR NEGATIVE 03/19/2016 2121   BILIRUBINUR NEGATIVE 03/19/2016 2121   KETONESUR NEGATIVE 03/19/2016 2121   PROTEINUR NEGATIVE 03/19/2016 2121   NITRITE NEGATIVE 03/19/2016 2121   LEUKOCYTESUR NEGATIVE 03/19/2016 2121   Sepsis Labs: @LABRCNTIP (procalcitonin:4,lacticidven:4)  ) Recent Results (from the past 240 hour(s))  Respiratory Panel by PCR     Status: Abnormal   Collection Time: 03/19/16 11:59 PM  Result Value Ref Range Status   Adenovirus NOT DETECTED NOT DETECTED  Final   Coronavirus 229E NOT DETECTED NOT DETECTED Final   Coronavirus HKU1 NOT DETECTED NOT DETECTED Final   Coronavirus NL63 NOT DETECTED NOT DETECTED Final   Coronavirus OC43 NOT DETECTED NOT DETECTED Final   Metapneumovirus NOT DETECTED NOT DETECTED Final   Rhinovirus / Enterovirus DETECTED (A) NOT DETECTED Final   Influenza A NOT DETECTED NOT DETECTED Final   Influenza A H1 NOT DETECTED NOT DETECTED Final   Influenza A H1 2009 NOT DETECTED NOT DETECTED Final   Influenza A H3 NOT DETECTED NOT DETECTED Final   Influenza B NOT DETECTED NOT DETECTED Final   Parainfluenza Virus 1 NOT DETECTED NOT DETECTED Final   Parainfluenza Virus 2 NOT DETECTED NOT DETECTED Final   Parainfluenza Virus 3 NOT DETECTED NOT DETECTED Final   Parainfluenza Virus 4 NOT  DETECTED NOT DETECTED Final   Respiratory Syncytial Virus NOT DETECTED NOT DETECTED Final   Bordetella pertussis NOT DETECTED NOT DETECTED Final   Chlamydophila pneumoniae NOT DETECTED NOT DETECTED Final   Mycoplasma pneumoniae NOT DETECTED NOT DETECTED Final  MRSA PCR Screening     Status: None   Collection Time: 03/20/16  1:15 AM  Result Value Ref Range Status   MRSA by PCR NEGATIVE NEGATIVE Final    Comment:        The GeneXpert MRSA Assay (FDA approved for NASAL specimens only), is one component of a comprehensive MRSA colonization surveillance program. It is not intended to diagnose MRSA infection nor to guide or monitor treatment for MRSA infections.       Radiology Studies: Dg Chest 2 View  03/19/2016  CLINICAL DATA:  Cough with shortness of breath for 3 days EXAM: CHEST  2 VIEW COMPARISON:  October 18, 2015 FINDINGS: Interstitium is mildly prominent in a generalized manner without edema or consolidation. Heart is upper normal in size with pulmonary vascularity within normal limits. No adenopathy. There is degenerative change in the thoracic spine. IMPRESSION: Interstitium prominent without frank edema or consolidation.  Suspect chronic inflammatory type change causing the interstitial prominence. Stable cardiac silhouette. Electronically Signed   By: Bretta Bang III M.D.   On: 03/19/2016 17:53   Ct Angio Chest Pe W/cm &/or Wo Cm  03/19/2016  CLINICAL DATA:  50 year old male with shortness of breath EXAM: CT ANGIOGRAPHY CHEST WITH CONTRAST TECHNIQUE: Multidetector CT imaging of the chest was performed using the standard protocol during bolus administration of intravenous contrast. Multiplanar CT image reconstructions and MIPs were obtained to evaluate the vascular anatomy. CONTRAST:  80 cc Isovue 370 COMPARISON:  Chest radiograph dated 03/19/2016 and chest CT dated 11/29/2012 FINDINGS: There are bibasilar linear consolidative changes as well as nodular densities in the lungs. There is apparent mild thickening of the bronchial walls the lung bases. Findings most compatible with multifocal pneumonia or bronchopneumonia. Clinical correlation is recommended. Scattered areas of nodularity noted in the upper lobes, evaluation is however limited due to respiratory motion artifact. There is no pleural effusion or pneumothorax. The central airways are patent. The thoracic aorta and the origins of the great vessels of the aortic arch appear patent. There is mild prominence of the main pulmonary trunk as well as pulmonary arteries indicative of a degree of pulmonary hypertension. There is no CT evidence of pulmonary embolism. There are bilateral hilar adenopathy. No mediastinal adenopathy. Go there is mild cardiomegaly. No pericardial effusion. Esophagus is grossly unremarkable. No thyroid nodules identified. There is no axillary adenopathy. The chest wall soft tissues appear unremarkable. There is degenerative changes of the spine. No acute fracture. Review of the MIP images confirms the above findings. IMPRESSION: No CT evidence of pulmonary embolism. Multi focal pneumonia versus bronchopneumonia predominantly involving the lung  bases. clinical correlation and follow-up resolution recommended. Electronically Signed   By: Elgie Collard M.D.   On: 03/19/2016 21:14   Dg Chest Port 1 View  03/20/2016  CLINICAL DATA:  50 year old male which shortness of breath EXAM: PORTABLE CHEST 1 VIEW COMPARISON:  Chest CT dated 03/19/2016 and radiograph dated 03/19/2016 FINDINGS: Single portable view of the chest demonstrates areas of airspace density primarily involving the lower lobes, left greater right corresponding to the density seen on the CT. There is no pleural effusion or pneumothorax. Set top-normal cardiac silhouette no acute osseous pathology. IMPRESSION: Bilateral lower lung field of nodular and airspace densities as  seen on the prior CT. Follow-up recommended. Electronically Signed   By: Elgie Collard M.D.   On: 03/20/2016 02:00     Scheduled Meds: . amLODipine  5 mg Oral Daily  . antiseptic oral rinse  7 mL Mouth Rinse BID  . aspirin EC  81 mg Oral Daily  . azithromycin  500 mg Intravenous Q24H  . cefTRIAXone (ROCEPHIN)  IV  2 g Intravenous Q24H  . guaiFENesin  600 mg Oral BID  . ipratropium  0.5 mg Nebulization Q4H  . levalbuterol  1.25 mg Nebulization Q4H  . metoprolol tartrate  25 mg Oral BID  . sodium chloride  1,000 mL Intravenous Once  . sodium chloride flush  3 mL Intravenous Q12H   Continuous Infusions: . sodium chloride 75 mL/hr at 03/20/16 0216  . heparin 2,100 Units/hr (03/20/16 0803)     LOS: 0 days   Time Spent in minutes   30 minutes  Bradleigh Sonnen D.O. on 03/20/2016 at 1:03 PM  Between 7am to 7pm - Pager - [phone]  After 7pm go to [email or web address] - password [email or web address]  And look for the night coverage person covering for me after hours  [facility] Office  [phone]

## 2016-03-20 NOTE — Progress Notes (Signed)
ANTICOAGULATION CONSULT NOTE - Follow Up Consult  Pharmacy Consult for heparin Indication: atrial fibrillation  Labs:  Recent Labs  03/19/16 1707 03/19/16 2010 03/20/16 0046 03/20/16 0336  HGB 15.6  --   --   --   HCT 46.0  --   --   --   PLT 263  --   --   --   LABPROT  --  14.9  --   --   INR  --  1.15  --   --   HEPARINUNFRC  --   --   --  0.17*  CREATININE 1.03  --   --   --   TROPONINI  --  0.08* 0.08*  --     Assessment: 50yo male subtherapeutic on heparin witn initial dosing for Afib.  Goal of Therapy:  Heparin level 0.3-0.7 units/ml   Plan:  Will rebolus with heparin 4000 units and increase gtt by 3 units/kg/hr to 2100 units/hr and check level in 6hr.  Vernard Gambles, PharmD, BCPS  03/20/2016,4:16 AM

## 2016-03-21 ENCOUNTER — Inpatient Hospital Stay (HOSPITAL_COMMUNITY): Payer: BLUE CROSS/BLUE SHIELD

## 2016-03-21 DIAGNOSIS — R0682 Tachypnea, not elsewhere classified: Secondary | ICD-10-CM | POA: Diagnosis present

## 2016-03-21 DIAGNOSIS — R0602 Shortness of breath: Secondary | ICD-10-CM | POA: Diagnosis present

## 2016-03-21 DIAGNOSIS — I4891 Unspecified atrial fibrillation: Secondary | ICD-10-CM

## 2016-03-21 DIAGNOSIS — R059 Cough, unspecified: Secondary | ICD-10-CM | POA: Diagnosis present

## 2016-03-21 DIAGNOSIS — R000 Tachycardia, unspecified: Secondary | ICD-10-CM | POA: Diagnosis present

## 2016-03-21 DIAGNOSIS — I5040 Unspecified combined systolic (congestive) and diastolic (congestive) heart failure: Secondary | ICD-10-CM

## 2016-03-21 DIAGNOSIS — R0600 Dyspnea, unspecified: Secondary | ICD-10-CM | POA: Diagnosis present

## 2016-03-21 DIAGNOSIS — R05 Cough: Secondary | ICD-10-CM

## 2016-03-21 DIAGNOSIS — I509 Heart failure, unspecified: Secondary | ICD-10-CM | POA: Insufficient documentation

## 2016-03-21 DIAGNOSIS — A419 Sepsis, unspecified organism: Secondary | ICD-10-CM | POA: Diagnosis present

## 2016-03-21 LAB — HEMOGLOBIN A1C
HEMOGLOBIN A1C: 7.8 % — AB (ref 4.8–5.6)
MEAN PLASMA GLUCOSE: 177 mg/dL

## 2016-03-21 LAB — EXPECTORATED SPUTUM ASSESSMENT W GRAM STAIN, RFLX TO RESP C: Special Requests: NORMAL

## 2016-03-21 LAB — ECHOCARDIOGRAM COMPLETE
Height: 75 in
WEIGHTICAEL: 5544 [oz_av]

## 2016-03-21 LAB — HEPARIN LEVEL (UNFRACTIONATED): HEPARIN UNFRACTIONATED: 0.23 [IU]/mL — AB (ref 0.30–0.70)

## 2016-03-21 LAB — CBC
HEMATOCRIT: 43.9 % (ref 39.0–52.0)
HEMOGLOBIN: 14.1 g/dL (ref 13.0–17.0)
MCH: 31.2 pg (ref 26.0–34.0)
MCHC: 32.1 g/dL (ref 30.0–36.0)
MCV: 97.1 fL (ref 78.0–100.0)
Platelets: 242 10*3/uL (ref 150–400)
RBC: 4.52 MIL/uL (ref 4.22–5.81)
RDW: 14.5 % (ref 11.5–15.5)
WBC: 11 10*3/uL — AB (ref 4.0–10.5)

## 2016-03-21 LAB — BASIC METABOLIC PANEL
ANION GAP: 11 (ref 5–15)
BUN: 11 mg/dL (ref 6–20)
CALCIUM: 8.5 mg/dL — AB (ref 8.9–10.3)
CO2: 25 mmol/L (ref 22–32)
Chloride: 105 mmol/L (ref 101–111)
Creatinine, Ser: 1.16 mg/dL (ref 0.61–1.24)
GFR calc non Af Amer: >60 mL/min (ref >60)
Glucose, Bld: 145 mg/dL — ABNORMAL HIGH (ref 65–99)
Potassium: 4.1 mmol/L (ref 3.5–5.1)
SODIUM: 141 mmol/L (ref 135–145)

## 2016-03-21 LAB — EXPECTORATED SPUTUM ASSESSMENT W REFEX TO RESP CULTURE

## 2016-03-21 LAB — BRAIN NATRIURETIC PEPTIDE: B Natriuretic Peptide: 97.6 pg/mL (ref 0.0–100.0)

## 2016-03-21 MED ORDER — HYDROCOD POLST-CPM POLST ER 10-8 MG/5ML PO SUER
5.0000 mL | Freq: Two times a day (BID) | ORAL | Status: DC | PRN
  Administered 2016-03-21 – 2016-03-24 (×3): 5 mL via ORAL
  Filled 2016-03-21 (×3): qty 5

## 2016-03-21 MED ORDER — LISINOPRIL 10 MG PO TABS
10.0000 mg | ORAL_TABLET | Freq: Every day | ORAL | Status: DC
  Administered 2016-03-21 – 2016-03-25 (×5): 10 mg via ORAL
  Filled 2016-03-21 (×5): qty 1

## 2016-03-21 MED ORDER — PERFLUTREN LIPID MICROSPHERE
1.0000 mL | INTRAVENOUS | Status: AC | PRN
  Administered 2016-03-21: 3 mL via INTRAVENOUS
  Filled 2016-03-21: qty 10

## 2016-03-21 MED ORDER — HEPARIN SODIUM (PORCINE) 5000 UNIT/ML IJ SOLN
5000.0000 [IU] | Freq: Three times a day (TID) | INTRAMUSCULAR | Status: DC
  Administered 2016-03-21 – 2016-03-24 (×9): 5000 [IU] via SUBCUTANEOUS
  Filled 2016-03-21 (×10): qty 1

## 2016-03-21 NOTE — Progress Notes (Signed)
ANTICOAGULATION CONSULT NOTE - Follow Up Consult  Pharmacy Consult for Heparin Indication: atrial fibrillation  Allergies  Allergen Reactions  . Morphine And Related Itching    Patient Measurements: Height: 6\' 3"  (190.5 cm) Weight: (!) 346 lb 8 oz (157.171 kg) IBW/kg (Calculated) : 84.5 Heparin Dosing Weight:  121 kg  Vital Signs: Temp: 98 F (36.7 C) (05/19 0407) Temp Source: Oral (05/19 0407) BP: 132/87 mmHg (05/19 0400) Pulse Rate: 95 (05/19 0407)  Labs:  Recent Labs  03/19/16 1707 03/19/16 2010 03/20/16 0046  03/20/16 0725 03/20/16 1008 03/20/16 1557 03/21/16 0504  HGB 15.6  --   --   --  14.5  --   --  14.1  HCT 46.0  --   --   --  44.3  --   --  43.9  PLT 263  --   --   --  255  --   --  242  LABPROT  --  14.9  --   --   --   --   --   --   INR  --  1.15  --   --   --   --   --   --   HEPARINUNFRC  --   --   --   < >  --  0.52 0.40 0.23*  CREATININE 1.03  --   --   --  1.12  --   --   --   TROPONINI  --  0.08* 0.08*  --  0.10*  --   --   --   < > = values in this interval not displayed.  Estimated Creatinine Clearance: 129.6 mL/min (by C-G formula based on Cr of 1.12).   Assessment: Pt on heparin for new afib. Heparin level down to subtherapeutic on 2100 units/hr. CBC stable. No issues with line or bleeding reported per RN.  Goal of Therapy:  Heparin level 0.3-0.7 units/ml Monitor platelets by anticoagulation protocol: Yes   Plan:  Increase IV heparin to 2400 units/hr F/u 6 hr heparin level  Christoper Fabian, PharmD, BCPS Clinical pharmacist, pager [phone]  03/21/2016,6:15 AM

## 2016-03-21 NOTE — Plan of Care (Addendum)
Problem: Undesirable Food Choices (NB-1.7) Goal: Nutrition education Formal process to instruct or train a patient/client in a skill or to impart knowledge to help patients/clients voluntarily manage or modify food choices and eating behavior to maintain or improve health. Outcome: Completed/Met Date Met:  03/21/16  RD consulted for nutrition education regarding weight loss.  Body mass index is 43.31 kg/(m^2). Pt meets criteria for Obesity Class III based on current BMI.  Patient having ECHO done upon visit.  Provided "Weight Loss Tips" handout from the [employer].  Per interview, pt drinks sodas, Hawaiian punch and juice.  Likes to snack as well.  Pt didn't seem interested in making diet/lifestyle changes.    Expect poor compliance.  Current diet order is Heart Healthy, patient is consuming approximately 90% of meals at this time. Labs and medications reviewed. No further nutrition interventions warranted at this time. If additional nutrition issues arise, please re-consult RD.  Arthur Holms, RD, LDN Pager #: [phone] After-Hours Pager #: [phone]

## 2016-03-21 NOTE — Progress Notes (Signed)
PROGRESS NOTE    Luis [name]  ZHG:[id number] DOB: [birth year] DOA: 03/19/2016 PCP: [name], MD    Brief Narrative:  50 year old male history of hypertension who presented with 1 week of cough and shortness of breath. Patient was presumably treated for pneumonia 2-3 months ago with doxycycline. Admitted for pneumonia and atrial fibrillation. Cardiology consulted. Assessment & Plan   Sepsis secondary to community-acquired pneumonia/Rhinovirus -Upon admission, patient was tachycardic, tachypneic, with leukocytosis -CTA chest no evidence of pulmonary embolism, multifocal pneumonia versus bronchopneumonia -Continue azithromycin and ceftriaxone -Sputum culture and blood cultures pending  -Respiratory viral panel + rhinovirus  Sinus tachycardia -Thought to be A. fib however rhythm does not show atrial for, shows sinus rhythm with [facility] -Cardiology consulted and appreciated -Echocardiogram EF 35-40%, grade 2 diastolic dysfunction; moderate global reduction in LV function, moderate LVH and LVE, mild LAE -TSH 0.777  Elevated troponin -Likely secondary to the above -Troponin peak at 0.1 -Likely demand ischemia -Echocardiogram as above -heparin drip discontinued, place on DVT prophylactic dose  New Combined systolic/diastolic HF -Echocardiogram as above -will discontinue IVF -Obtain BNP -Patient does not appear to be volume overloaded -Will start on low dose -Monitor intake/output, daily weights  Essential hypertension -Supposedly patient ran out of his medications -Continue Norvasc and metoprolol  Morbid obesity -Upon discharge patient will need to discuss with his primary care physician last modifications including diet and exercise -BMI 43.7  Possible sleep apnea -Will need outpatient sleep study  DVT Prophylaxis  heparin  Code Status: Full  Family Communication: None at bedside  Disposition Plan: Admitted. Continue to monitor in stepdown. Pending further  cardiology recommenations.   Consultants Cardiology   Procedures  none  Antibiotics   Anti-infectives    Start     Dose/Rate Route Frequency Ordered Stop   03/20/16 2200  cefTRIAXone (ROCEPHIN) 1 g in dextrose 5 % 50 mL IVPB  Status:  Discontinued     1 g 100 mL/hr over 30 Minutes Intravenous Every 24 hours 03/19/16 2219 03/19/16 2239   03/20/16 2200  cefTRIAXone (ROCEPHIN) 2 g in dextrose 5 % 50 mL IVPB     2 g 100 mL/hr over 30 Minutes Intravenous Every 24 hours 03/19/16 2239 03/26/16 2159   03/20/16 2000  azithromycin (ZITHROMAX) 500 mg in dextrose 5 % 250 mL IVPB     500 mg 250 mL/hr over 60 Minutes Intravenous Every 24 hours 03/19/16 2219 03/26/16 1959   03/19/16 1745  cefTRIAXone (ROCEPHIN) 1 g in dextrose 5 % 50 mL IVPB     1 g 100 mL/hr over 30 Minutes Intravenous  Once 03/19/16 1730 03/19/16 1912   03/19/16 1745  azithromycin (ZITHROMAX) 500 mg in dextrose 5 % 250 mL IVPB     500 mg 250 mL/hr over 60 Minutes Intravenous  Once 03/19/16 1730 03/19/16 1943      Subjective:   Luis [name] seen and examined today.   Patient continues to complain of cough.  Feels breathing has improved mildly.  Denies currently chest pain.  Denies abdominal pain, nausea or vomiting, diarrhea or constipation.  Objective:   Filed Vitals:   03/21/16 0741 03/21/16 0833 03/21/16 1224 03/21/16 1300  BP:  141/87 136/108   Pulse:  76 91   Temp:  98.2 F (36.8 C) 99.8 F (37.7 C)   TempSrc:  Oral Oral   Resp:   18   Height:      Weight:      SpO2: 97% 95% 95% 91%  Intake/Output Summary (Last 24 hours) at 03/21/16 1320 Last data filed at 03/21/16 0833  Gross per 24 hour  Intake 2581.05 ml  Output   1300 ml  Net 1281.05 ml   Filed Weights   03/19/16 2200 03/20/16 0421 03/21/16 0407  Weight: 158.305 kg (349 lb) 155.7 kg (343 lb 4.1 oz) 157.171 kg (346 lb 8 oz)    Exam  General: Well developed, well nourished, NAD, appears stated age  HEENT: NCAT,mucous membranes moist.    Cardiovascular: S1 S2 auscultated, Tachycardic, no murmur  Respiratory: Diminished breath sounds, likely due to body habitus  Abdomen: Soft, nontender, nondistended, + bowel sounds  Extremities: warm dry without cyanosis clubbing or edema  Neuro: AAOx3, nonfocal  Psych: Normal affect and demeanor    Data Reviewed: I have personally reviewed following labs and imaging studies  CBC:  Recent Labs Lab 03/19/16 1707 03/20/16 0725 03/21/16 0504  WBC 14.6* 15.2* 11.0*  HGB 15.6 14.5 14.1  HCT 46.0 44.3 43.9  MCV 95.6 95.7 97.1  PLT 263 255 242   Basic Metabolic Panel:  Recent Labs Lab 03/19/16 1707 03/20/16 0725 03/21/16 0504  NA 138 140 141  K 3.8 3.5 4.1  CL 102 102 105  CO2 24 24 25   GLUCOSE 112* 128* 145*  BUN 10 7 11   CREATININE 1.03 1.12 1.16  CALCIUM 8.6* 8.4* 8.5*  MG  --  2.0  --   PHOS  --  3.8  --    GFR: Estimated Creatinine Clearance: 125.1 mL/min (by C-G formula based on Cr of 1.16). Liver Function Tests:  Recent Labs Lab 03/19/16 1733 03/20/16 0725  AST 23 18  ALT 21 19  ALKPHOS 110 108  BILITOT 0.9 0.8  PROT 7.4 7.3  ALBUMIN 3.3* 3.3*   No results for input(s): LIPASE, AMYLASE in the last 168 hours. No results for input(s): AMMONIA in the last 168 hours. Coagulation Profile:  Recent Labs Lab 03/19/16 2010  INR 1.15   Cardiac Enzymes:  Recent Labs Lab 03/19/16 2010 03/20/16 0046 03/20/16 0725  TROPONINI 0.08* 0.08* 0.10*   BNP (last 3 results) No results for input(s): PROBNP in the last 8760 hours. HbA1C:  Recent Labs  03/20/16 0335  HGBA1C 7.8*   CBG: No results for input(s): GLUCAP in the last 168 hours. Lipid Profile: No results for input(s): CHOL, HDL, LDLCALC, TRIG, CHOLHDL, LDLDIRECT in the last 72 hours. Thyroid Function Tests:  Recent Labs  03/19/16 2010  TSH 0.777   Anemia Panel: No results for input(s): VITAMINB12, FOLATE, FERRITIN, TIBC, IRON, RETICCTPCT in the last 72 hours. Urine analysis:     Component Value Date/Time   COLORURINE YELLOW 03/19/2016 2121   APPEARANCEUR CLEAR 03/19/2016 2121   LABSPEC 1.015 03/19/2016 2121   PHURINE 7.5 03/19/2016 2121   GLUCOSEU NEGATIVE 03/19/2016 2121   HGBUR NEGATIVE 03/19/2016 2121   BILIRUBINUR NEGATIVE 03/19/2016 2121   KETONESUR NEGATIVE 03/19/2016 2121   PROTEINUR NEGATIVE 03/19/2016 2121   NITRITE NEGATIVE 03/19/2016 2121   LEUKOCYTESUR NEGATIVE 03/19/2016 2121   Sepsis Labs: @LABRCNTIP (procalcitonin:4,lacticidven:4)  ) Recent Results (from the past 240 hour(s))  Blood Culture (routine x 2)     Status: None (Preliminary result)   Collection Time: 03/19/16  5:33 PM  Result Value Ref Range Status   Specimen Description BLOOD RIGHT FOREARM  Final   Special Requests BOTTLES DRAWN AEROBIC AND ANAEROBIC 5CC  Final   Culture NO GROWTH < 24 HOURS  Final   Report Status PENDING  Incomplete  Blood Culture (routine x 2)     Status: None (Preliminary result)   Collection Time: 03/19/16  6:40 PM  Result Value Ref Range Status   Specimen Description BLOOD LEFT ANTECUBITAL  Final   Special Requests BOTTLES DRAWN AEROBIC AND ANAEROBIC 5CC  Final   Culture NO GROWTH < 24 HOURS  Final   Report Status PENDING  Incomplete  Respiratory Panel by PCR     Status: Abnormal   Collection Time: 03/19/16 11:59 PM  Result Value Ref Range Status   Adenovirus NOT DETECTED NOT DETECTED Final   Coronavirus 229E NOT DETECTED NOT DETECTED Final   Coronavirus HKU1 NOT DETECTED NOT DETECTED Final   Coronavirus NL63 NOT DETECTED NOT DETECTED Final   Coronavirus OC43 NOT DETECTED NOT DETECTED Final   Metapneumovirus NOT DETECTED NOT DETECTED Final   Rhinovirus / Enterovirus DETECTED (A) NOT DETECTED Final   Influenza A NOT DETECTED NOT DETECTED Final   Influenza A H1 NOT DETECTED NOT DETECTED Final   Influenza A H1 2009 NOT DETECTED NOT DETECTED Final   Influenza A H3 NOT DETECTED NOT DETECTED Final   Influenza B NOT DETECTED NOT DETECTED Final    Parainfluenza Virus 1 NOT DETECTED NOT DETECTED Final   Parainfluenza Virus 2 NOT DETECTED NOT DETECTED Final   Parainfluenza Virus 3 NOT DETECTED NOT DETECTED Final   Parainfluenza Virus 4 NOT DETECTED NOT DETECTED Final   Respiratory Syncytial Virus NOT DETECTED NOT DETECTED Final   Bordetella pertussis NOT DETECTED NOT DETECTED Final   Chlamydophila pneumoniae NOT DETECTED NOT DETECTED Final   Mycoplasma pneumoniae NOT DETECTED NOT DETECTED Final  MRSA PCR Screening     Status: None   Collection Time: 03/20/16  1:15 AM  Result Value Ref Range Status   MRSA by PCR NEGATIVE NEGATIVE Final    Comment:        The GeneXpert MRSA Assay (FDA approved for NASAL specimens only), is one component of a comprehensive MRSA colonization surveillance program. It is not intended to diagnose MRSA infection nor to guide or monitor treatment for MRSA infections.   Culture, sputum-assessment     Status: None   Collection Time: 03/20/16  7:42 PM  Result Value Ref Range Status   Specimen Description EXPECTORATED SPUTUM  Final   Special Requests Normal  Final   Sputum evaluation   Final    THIS SPECIMEN IS ACCEPTABLE. RESPIRATORY CULTURE REPORT TO FOLLOW.   Report Status 03/21/2016 FINAL  Final      Radiology Studies: Dg Chest 2 View  03/19/2016  CLINICAL DATA:  Cough with shortness of breath for 3 days EXAM: CHEST  2 VIEW COMPARISON:  October 18, 2015 FINDINGS: Interstitium is mildly prominent in a generalized manner without edema or consolidation. Heart is upper normal in size with pulmonary vascularity within normal limits. No adenopathy. There is degenerative change in the thoracic spine. IMPRESSION: Interstitium prominent without frank edema or consolidation. Suspect chronic inflammatory type change causing the interstitial prominence. Stable cardiac silhouette. Electronically Signed   By: Bretta Bang III M.D.   On: 03/19/2016 17:53   Ct Angio Chest Pe W/cm &/or Wo Cm  03/19/2016   CLINICAL DATA:  50 year old male with shortness of breath EXAM: CT ANGIOGRAPHY CHEST WITH CONTRAST TECHNIQUE: Multidetector CT imaging of the chest was performed using the standard protocol during bolus administration of intravenous contrast. Multiplanar CT image reconstructions and MIPs were obtained to evaluate the vascular anatomy. CONTRAST:  80 cc Isovue 370 COMPARISON:  Chest radiograph  dated 03/19/2016 and chest CT dated 11/29/2012 FINDINGS: There are bibasilar linear consolidative changes as well as nodular densities in the lungs. There is apparent mild thickening of the bronchial walls the lung bases. Findings most compatible with multifocal pneumonia or bronchopneumonia. Clinical correlation is recommended. Scattered areas of nodularity noted in the upper lobes, evaluation is however limited due to respiratory motion artifact. There is no pleural effusion or pneumothorax. The central airways are patent. The thoracic aorta and the origins of the great vessels of the aortic arch appear patent. There is mild prominence of the main pulmonary trunk as well as pulmonary arteries indicative of a degree of pulmonary hypertension. There is no CT evidence of pulmonary embolism. There are bilateral hilar adenopathy. No mediastinal adenopathy. Go there is mild cardiomegaly. No pericardial effusion. Esophagus is grossly unremarkable. No thyroid nodules identified. There is no axillary adenopathy. The chest wall soft tissues appear unremarkable. There is degenerative changes of the spine. No acute fracture. Review of the MIP images confirms the above findings. IMPRESSION: No CT evidence of pulmonary embolism. Multi focal pneumonia versus bronchopneumonia predominantly involving the lung bases. clinical correlation and follow-up resolution recommended. Electronically Signed   By: Elgie Collard M.D.   On: 03/19/2016 21:14   Dg Chest Port 1 View  03/20/2016  CLINICAL DATA:  50 year old male which shortness of breath  EXAM: PORTABLE CHEST 1 VIEW COMPARISON:  Chest CT dated 03/19/2016 and radiograph dated 03/19/2016 FINDINGS: Single portable view of the chest demonstrates areas of airspace density primarily involving the lower lobes, left greater right corresponding to the density seen on the CT. There is no pleural effusion or pneumothorax. Set top-normal cardiac silhouette no acute osseous pathology. IMPRESSION: Bilateral lower lung field of nodular and airspace densities as seen on the prior CT. Follow-up recommended. Electronically Signed   By: Elgie Collard M.D.   On: 03/20/2016 02:00     Scheduled Meds: . amLODipine  5 mg Oral Daily  . antiseptic oral rinse  7 mL Mouth Rinse BID  . aspirin EC  81 mg Oral Daily  . azithromycin  500 mg Intravenous Q24H  . cefTRIAXone (ROCEPHIN)  IV  2 g Intravenous Q24H  . guaiFENesin  600 mg Oral BID  . heparin subcutaneous  5,000 Units Subcutaneous Q8H  . ipratropium  0.5 mg Nebulization Q6H  . levalbuterol  1.25 mg Nebulization Q6H  . lisinopril  10 mg Oral Daily  . metoprolol tartrate  25 mg Oral BID  . sodium chloride  1,000 mL Intravenous Once  . sodium chloride flush  3 mL Intravenous Q12H   Continuous Infusions:     LOS: 1 day   Time Spent in minutes   30 minutes  Tita Terhaar D.O. on 03/21/2016 at 1:20 PM  Between 7am to 7pm - Pager - [phone]  After 7pm go to [email or web address] - password [email or web address]  And look for the night coverage person covering for me after hours  [facility] Office  [phone]

## 2016-03-21 NOTE — Progress Notes (Signed)
     SUBJECTIVE: Breathing better. No pain.   Tele: sinus  BP 132/87 mmHg  Pulse 95  Temp(Src) 98 F (36.7 C) (Oral)  Resp 27  Ht 6\' 3"  (1.905 m)  Wt 346 lb 8 oz (157.171 kg)  BMI 43.31 kg/m2  SpO2 97%  Intake/Output Summary (Last 24 hours) at 03/21/16 0757 Last data filed at 03/21/16 0700  Gross per 24 hour  Intake 3757.05 ml  Output   1670 ml  Net 2087.05 ml    PHYSICAL EXAM General: Obese male, Alert and oriented x 3.  Psych:  Good affect, responds appropriately Neck: No JVD. No masses noted.  Lungs: Clear bilaterally with no wheezes or rhonci noted.  Heart: RRR with no murmurs noted. Abdomen: Bowel sounds are present. Soft, non-tender.  Extremities: No lower extremity edema.   LABS: Basic Metabolic Panel:  Recent Labs  32/54/98 0725 03/21/16 0504  NA 140 141  K 3.5 4.1  CL 102 105  CO2 24 25  GLUCOSE 128* 145*  BUN 7 11  CREATININE 1.12 1.16  CALCIUM 8.4* 8.5*  MG 2.0  --   PHOS 3.8  --    CBC:  Recent Labs  03/20/16 0725 03/21/16 0504  WBC 15.2* 11.0*  HGB 14.5 14.1  HCT 44.3 43.9  MCV 95.7 97.1  PLT 255 242   Cardiac Enzymes:  Recent Labs  03/19/16 2010 03/20/16 0046 03/20/16 0725  TROPONINI 0.08* 0.08* 0.10*   Current Meds: . amLODipine  5 mg Oral Daily  . antiseptic oral rinse  7 mL Mouth Rinse BID  . aspirin EC  81 mg Oral Daily  . azithromycin  500 mg Intravenous Q24H  . cefTRIAXone (ROCEPHIN)  IV  2 g Intravenous Q24H  . guaiFENesin  600 mg Oral BID  . ipratropium  0.5 mg Nebulization Q6H  . levalbuterol  1.25 mg Nebulization Q6H  . metoprolol tartrate  25 mg Oral BID  . sodium chloride  1,000 mL Intravenous Once  . sodium chloride flush  3 mL Intravenous Q12H   ASSESSMENT AND PLAN:  1. Multifocal PNA: Pt presented with a three day h/o progressive dyspnea, cough, and diaphoresis. Chest CT showed multifocal PNA. Abx per IM.  2. Elevated troponin: In setting of pneumonia. Pt has had sinus tachycardia with  frequent PAC's and also chest tightness with a pleuritic component - worse with coughing. His troponin is mildly elevated with a flat trend of 0.08  0.08  0.10. Suspect demand ischemia in setting of #1. No prior h/o CAD. RF include HTN, obesity, and h/o tobacco abuse. Cont ASA and beta blocker. Echo is pending to assess LVEF. If LV function is normal on echo, will plan conservative therapy and consider outpt 2 day myoview once he recovers from respiratory illness. If heparin is being used because of the elevated troponin, this could be stopped.   3. Essential HTN: BP is better controlled today. Currently on amlodipine and metoprolol.   4. Likely OSA: outpatient sleep study.   Verne Carrow  5/19/20177:57 AM

## 2016-03-21 NOTE — Progress Notes (Signed)
Received call from [facility] Infection control to discontinue droplet precautions per protocol.

## 2016-03-21 NOTE — Progress Notes (Signed)
  Echocardiogram 2D Echocardiogram has been performed.  Arvil Chaco 03/21/2016, 11:48 AM

## 2016-03-22 ENCOUNTER — Inpatient Hospital Stay (HOSPITAL_COMMUNITY): Payer: BLUE CROSS/BLUE SHIELD

## 2016-03-22 DIAGNOSIS — I5020 Unspecified systolic (congestive) heart failure: Secondary | ICD-10-CM

## 2016-03-22 DIAGNOSIS — I480 Paroxysmal atrial fibrillation: Secondary | ICD-10-CM

## 2016-03-22 DIAGNOSIS — I509 Heart failure, unspecified: Secondary | ICD-10-CM

## 2016-03-22 LAB — BASIC METABOLIC PANEL
ANION GAP: 10 (ref 5–15)
BUN: 13 mg/dL (ref 6–20)
CALCIUM: 8.5 mg/dL — AB (ref 8.9–10.3)
CHLORIDE: 104 mmol/L (ref 101–111)
CO2: 25 mmol/L (ref 22–32)
Creatinine, Ser: 1.18 mg/dL (ref 0.61–1.24)
GFR calc non Af Amer: >60 mL/min (ref >60)
GLUCOSE: 108 mg/dL — AB (ref 65–99)
POTASSIUM: 4.2 mmol/L (ref 3.5–5.1)
Sodium: 139 mmol/L (ref 135–145)

## 2016-03-22 LAB — CBC
HEMATOCRIT: 42.6 % (ref 39.0–52.0)
HEMOGLOBIN: 13.5 g/dL (ref 13.0–17.0)
MCH: 31 pg (ref 26.0–34.0)
MCHC: 31.7 g/dL (ref 30.0–36.0)
MCV: 97.9 fL (ref 78.0–100.0)
Platelets: 256 10*3/uL (ref 150–400)
RBC: 4.35 MIL/uL (ref 4.22–5.81)
RDW: 14.5 % (ref 11.5–15.5)
WBC: 8.9 10*3/uL (ref 4.0–10.5)

## 2016-03-22 MED ORDER — TECHNETIUM TC 99M TETROFOSMIN IV KIT: 10.0000 | PACK | Freq: Once | INTRAVENOUS | Status: DC | PRN

## 2016-03-22 MED ORDER — BENZONATATE 100 MG PO CAPS
200.0000 mg | ORAL_CAPSULE | Freq: Three times a day (TID) | ORAL | Status: DC
  Administered 2016-03-22 – 2016-03-26 (×13): 200 mg via ORAL
  Filled 2016-03-22 (×13): qty 2

## 2016-03-22 MED ORDER — REGADENOSON 0.4 MG/5ML IV SOLN
INTRAVENOUS | Status: AC
  Administered 2016-03-22: 0.4 mg
  Filled 2016-03-22: qty 5

## 2016-03-22 MED ORDER — TECHNETIUM TC 99M TETROFOSMIN IV KIT
30.0000 | PACK | Freq: Once | INTRAVENOUS | Status: AC | PRN
  Administered 2016-03-22: 30 via INTRAVENOUS

## 2016-03-22 MED ORDER — LEVALBUTEROL HCL 1.25 MG/0.5ML IN NEBU
1.2500 mg | INHALATION_SOLUTION | Freq: Three times a day (TID) | RESPIRATORY_TRACT | Status: DC
  Administered 2016-03-22 – 2016-03-24 (×7): 1.25 mg via RESPIRATORY_TRACT
  Filled 2016-03-22 (×7): qty 0.5

## 2016-03-22 MED ORDER — IPRATROPIUM BROMIDE 0.02 % IN SOLN
0.5000 mg | Freq: Three times a day (TID) | RESPIRATORY_TRACT | Status: DC
  Administered 2016-03-22 – 2016-03-24 (×7): 0.5 mg via RESPIRATORY_TRACT
  Filled 2016-03-22 (×7): qty 2.5

## 2016-03-22 MED ORDER — BENZONATATE 100 MG PO CAPS
100.0000 mg | ORAL_CAPSULE | Freq: Once | ORAL | Status: AC
  Administered 2016-03-22: 100 mg via ORAL
  Filled 2016-03-22: qty 1

## 2016-03-22 NOTE — Progress Notes (Signed)
Subjective: No CP.  Some SOB and a persistent cough  Objective: Vital signs in last 24 hours: Temp:  [97.5 F (36.4 C)-99.8 F (37.7 C)] 98.6 F (37 C) (05/20 0836) Pulse Rate:  [52-102] 99 (05/20 0836) Resp:  [18-41] 28 (05/20 0836) BP: (107-137)/(69-108) 135/96 mmHg (05/20 0836) SpO2:  [91 %-97 %] 95 % (05/20 0836) Weight:  [347 lb 9.6 oz (157.67 kg)] 347 lb 9.6 oz (157.67 kg) (05/20 0428) Last BM Date: 03/21/16  Intake/Output from previous day: 05/19 0701 - 05/20 0700 In: 1053 [I.V.:753; IV Piggyback:300] Out: 925 [Urine:925] Intake/Output this shift: Total I/O In: -  Out: 300 [Urine:300]  Medications Scheduled Meds: . amLODipine  5 mg Oral Daily  . antiseptic oral rinse  7 mL Mouth Rinse BID  . aspirin EC  81 mg Oral Daily  . azithromycin  500 mg Intravenous Q24H  . cefTRIAXone (ROCEPHIN)  IV  2 g Intravenous Q24H  . guaiFENesin  600 mg Oral BID  . heparin subcutaneous  5,000 Units Subcutaneous Q8H  . ipratropium  0.5 mg Nebulization TID  . levalbuterol  1.25 mg Nebulization TID  . lisinopril  10 mg Oral Daily  . metoprolol tartrate  25 mg Oral BID  . regadenoson      . sodium chloride  1,000 mL Intravenous Once  . sodium chloride flush  3 mL Intravenous Q12H   Continuous Infusions:  PRN Meds:.acetaminophen **OR** acetaminophen, chlorpheniramine-HYDROcodone, hydrALAZINE, levalbuterol, ondansetron **OR** ondansetron (ZOFRAN) IV  PE: General appearance: alert, cooperative and no distress Lungs: Decreased BS bilaterally with course sounds. No wheeze  Heart: regular rate and rhythm, S1, S2 normal, no murmur, click, rub or gallop Extremities: No LEE Pulses: 2+ and symmetric Skin: Warm and dry Neurologic: Grossly normal  Lab Results:   Recent Labs  03/20/16 0725 03/21/16 0504 03/22/16 0242  WBC 15.2* 11.0* 8.9  HGB 14.5 14.1 13.5  HCT 44.3 43.9 42.6  PLT 255 242 256   BMET  Recent Labs  03/20/16 0725 03/21/16 0504 03/22/16 0242  NA 140 141  139  K 3.5 4.1 4.2  CL 102 105 104  CO2 24 25 25   GLUCOSE 128* 145* 108*  BUN 7 11 13   CREATININE 1.12 1.16 1.18  CALCIUM 8.4* 8.5* 8.5*   PT/INR  Recent Labs  03/19/16 2010  LABPROT 14.9  INR 1.15      Assessment/Plan   Active Problems:   Hypertension   SIRS (systemic inflammatory response syndrome) (HCC)   Elevated troponin   A-fib (HCC)   CAP (community acquired pneumonia)   CHF (congestive heart failure) (HCC)   Cough   Dyspnea   Sepsis (HCC)   SOB (shortness of breath)   Tachycardia   Tachypnea  1. Multifocal PNA: Pt presented with a three day h/o progressive dyspnea, cough, and diaphoresis. Chest CT showed multifocal PNA. Abx per IM.  2. Elevated troponin: In setting of pneumonia. Pt has had sinus tachycardia with frequent PAC's and also chest tightness with a pleuritic component - worse with coughing. His troponin is mildly elevated with a flat trend of 0.08  0.08  0.10. Suspect demand ischemia in setting of #1. No prior h/o CAD. RF include HTN, obesity, and h/o tobacco abuse. Cont ASA and beta blocker. Echo EF 35-40% with diffuse hypokinesis. G2DD  Per Dr Gibson Ramp note, Eugenie Birks as OP however, the stress test was ordered by primary team.  It is a two day test with stress images today.  He tolerated it well.  3. Essential HTN: BP a little elevated.  Currently on amlodipine and metoprolol.   4. Likely OSA: outpatient sleep study.    LOS: 2 days    Wilburt Finlay PA-C 03/22/2016 10:18 AM  Still with expiratory wheezing and cough Having two day myovue. Discussed potential for breathing to get A bit worse with lexiscan. Will have rest scan tomorrow Continue calicum and beta blockers for BP R/O ischemic DCM  Charlton Haws

## 2016-03-22 NOTE — Progress Notes (Signed)
Report given to 2W receiving nurse Sheryle Spray, RN. Pt remains in nuclear medicine at this time. Will transfer to [id number] when returns to floor.

## 2016-03-22 NOTE — Progress Notes (Signed)
PROGRESS NOTE    Luis [name]  ZOX:[id number] DOB: 1966-04-06 DOA: 03/19/2016 PCP: [name], MD    Brief Narrative:  50 year old male history of hypertension who presented with 1 week of cough and shortness of breath. Patient was presumably treated for pneumonia 2-3 months ago with doxycycline. Admitted for pneumonia and atrial fibrillation. Cardiology consulted.  Assessment & Plan   Sepsis secondary to community-acquired pneumonia/Rhinovirus -Upon admission, patient was tachycardic, tachypneic, with leukocytosis -CTA chest no evidence of pulmonary embolism, multifocal pneumonia versus bronchopneumonia -Leukocytosis resolved -Urine strep pneumonia Ag negative -Continue azithromycin and ceftriaxone -Sputum culture and blood cultures pending  -Respiratory viral panel + rhinovirus -Continue antitussives, will make tessalon scheduled TID  Sinus tachycardia -Thought to be A. fib however rhythm does not show atrial for, shows sinus rhythm with [facility] -Cardiology consulted and appreciated -Echocardiogram EF 35-40%, grade 2 diastolic dysfunction; moderate global reduction in LV function, moderate LVH and LVE, mild LAE -TSH 0.777  Elevated troponin -Likely secondary to the above -Troponin peak at 0.1 -Likely demand ischemia -Echocardiogram as above -heparin drip discontinued, place on DVT prophylactic dose  New Combined systolic/diastolic HF -Echocardiogram as above -will discontinue IVF -BNP 97 -Patient does not appear to be volume overloaded -Started low dose lisinorpil -Monitor intake/output, daily weights -Spoke with cardiology on 5/20, recommended myoview  Essential hypertension -Supposedly patient ran out of his medications -Continue Norvasc and metoprolol, lisinopril  Morbid obesity -Upon discharge patient will need to discuss with his primary care physician last modifications including diet and exercise -BMI 43.7  Possible sleep apnea -Will need  outpatient sleep study  DVT Prophylaxis  heparin  Code Status: Full  Family Communication: None at bedside  Disposition Plan: Admitted. Pending further cardiology recommenations. Will transfer to tele.  Consultants Cardiology   Procedures  Echocardiogram  Antibiotics   Anti-infectives    Start     Dose/Rate Route Frequency Ordered Stop   03/20/16 2200  cefTRIAXone (ROCEPHIN) 1 g in dextrose 5 % 50 mL IVPB  Status:  Discontinued     1 g 100 mL/hr over 30 Minutes Intravenous Every 24 hours 03/19/16 2219 03/19/16 2239   03/20/16 2200  cefTRIAXone (ROCEPHIN) 2 g in dextrose 5 % 50 mL IVPB     2 g 100 mL/hr over 30 Minutes Intravenous Every 24 hours 03/19/16 2239 03/26/16 2159   03/20/16 2000  azithromycin (ZITHROMAX) 500 mg in dextrose 5 % 250 mL IVPB     500 mg 250 mL/hr over 60 Minutes Intravenous Every 24 hours 03/19/16 2219 03/26/16 1959   03/19/16 1745  cefTRIAXone (ROCEPHIN) 1 g in dextrose 5 % 50 mL IVPB     1 g 100 mL/hr over 30 Minutes Intravenous  Once 03/19/16 1730 03/19/16 1912   03/19/16 1745  azithromycin (ZITHROMAX) 500 mg in dextrose 5 % 250 mL IVPB     500 mg 250 mL/hr over 60 Minutes Intravenous  Once 03/19/16 1730 03/19/16 1943      Subjective:   Luis [name] seen and examined today.   Patient continues to complain of productive cough.  Feels breathing is better. Denies currently chest pain.  Denies abdominal pain, nausea or vomiting, diarrhea or constipation.  Objective:   Filed Vitals:   03/22/16 0428 03/22/16 0746 03/22/16 0836 03/22/16 1020  BP: 119/77  135/96 140/91  Pulse: 62  99 96  Temp: 98.9 F (37.2 C)  98.6 F (37 C)   TempSrc: Oral  Oral   Resp: 23  28  Height:      Weight: 157.67 kg (347 lb 9.6 oz)     SpO2: 96% 94% 95%     Intake/Output Summary (Last 24 hours) at 03/22/16 1024 Last data filed at 03/22/16 1610  Gross per 24 hour  Intake   1053 ml  Output   1075 ml  Net    -22 ml   Filed Weights   03/20/16 0421 03/21/16  0407 03/22/16 0428  Weight: 155.7 kg (343 lb 4.1 oz) 157.171 kg (346 lb 8 oz) 157.67 kg (347 lb 9.6 oz)    Exam  General: Well developed, morbidly obese,  NAD  HEENT: NCAT,mucous membranes moist.   Cardiovascular: S1 S2 auscultated, RRR, no murmur  Respiratory: Diminished breath sounds, likely due to body habitus  Abdomen: Soft, obese, nontender, nondistended, + bowel sounds  Extremities: warm dry without cyanosis clubbing or edema  Neuro: AAOx3, nonfocal  Psych: Normal affect and demeanor, pleasant   Data Reviewed: I have personally reviewed following labs and imaging studies  CBC:  Recent Labs Lab 03/19/16 1707 03/20/16 0725 03/21/16 0504 03/22/16 0242  WBC 14.6* 15.2* 11.0* 8.9  HGB 15.6 14.5 14.1 13.5  HCT 46.0 44.3 43.9 42.6  MCV 95.6 95.7 97.1 97.9  PLT 263 255 242 256   Basic Metabolic Panel:  Recent Labs Lab 03/19/16 1707 03/20/16 0725 03/21/16 0504 03/22/16 0242  NA 138 140 141 139  K 3.8 3.5 4.1 4.2  CL 102 102 105 104  CO2 24 24 25 25   GLUCOSE 112* 128* 145* 108*  BUN 10 7 11 13   CREATININE 1.03 1.12 1.16 1.18  CALCIUM 8.6* 8.4* 8.5* 8.5*  MG  --  2.0  --   --   PHOS  --  3.8  --   --    GFR: Estimated Creatinine Clearance: 123.2 mL/min (by C-G formula based on Cr of 1.18). Liver Function Tests:  Recent Labs Lab 03/19/16 1733 03/20/16 0725  AST 23 18  ALT 21 19  ALKPHOS 110 108  BILITOT 0.9 0.8  PROT 7.4 7.3  ALBUMIN 3.3* 3.3*   No results for input(s): LIPASE, AMYLASE in the last 168 hours. No results for input(s): AMMONIA in the last 168 hours. Coagulation Profile:  Recent Labs Lab 03/19/16 2010  INR 1.15   Cardiac Enzymes:  Recent Labs Lab 03/19/16 2010 03/20/16 0046 03/20/16 0725  TROPONINI 0.08* 0.08* 0.10*   BNP (last 3 results) No results for input(s): PROBNP in the last 8760 hours. HbA1C:  Recent Labs  03/20/16 0335  HGBA1C 7.8*   CBG: No results for input(s): GLUCAP in the last 168 hours. Lipid  Profile: No results for input(s): CHOL, HDL, LDLCALC, TRIG, CHOLHDL, LDLDIRECT in the last 72 hours. Thyroid Function Tests:  Recent Labs  03/19/16 2010  TSH 0.777   Anemia Panel: No results for input(s): VITAMINB12, FOLATE, FERRITIN, TIBC, IRON, RETICCTPCT in the last 72 hours. Urine analysis:    Component Value Date/Time   COLORURINE YELLOW 03/19/2016 2121   APPEARANCEUR CLEAR 03/19/2016 2121   LABSPEC 1.015 03/19/2016 2121   PHURINE 7.5 03/19/2016 2121   GLUCOSEU NEGATIVE 03/19/2016 2121   HGBUR NEGATIVE 03/19/2016 2121   BILIRUBINUR NEGATIVE 03/19/2016 2121   KETONESUR NEGATIVE 03/19/2016 2121   PROTEINUR NEGATIVE 03/19/2016 2121   NITRITE NEGATIVE 03/19/2016 2121   LEUKOCYTESUR NEGATIVE 03/19/2016 2121   Sepsis Labs: @LABRCNTIP (procalcitonin:4,lacticidven:4)  ) Recent Results (from the past 240 hour(s))  Blood Culture (routine x 2)     Status: None (Preliminary  result)   Collection Time: 03/19/16  5:33 PM  Result Value Ref Range Status   Specimen Description BLOOD RIGHT FOREARM  Final   Special Requests BOTTLES DRAWN AEROBIC AND ANAEROBIC 5CC  Final   Culture NO GROWTH 3 DAYS  Final   Report Status PENDING  Incomplete  Blood Culture (routine x 2)     Status: None (Preliminary result)   Collection Time: 03/19/16  6:40 PM  Result Value Ref Range Status   Specimen Description BLOOD LEFT ANTECUBITAL  Final   Special Requests BOTTLES DRAWN AEROBIC AND ANAEROBIC 5CC  Final   Culture NO GROWTH 3 DAYS  Final   Report Status PENDING  Incomplete  Respiratory Panel by PCR     Status: Abnormal   Collection Time: 03/19/16 11:59 PM  Result Value Ref Range Status   Adenovirus NOT DETECTED NOT DETECTED Final   Coronavirus 229E NOT DETECTED NOT DETECTED Final   Coronavirus HKU1 NOT DETECTED NOT DETECTED Final   Coronavirus NL63 NOT DETECTED NOT DETECTED Final   Coronavirus OC43 NOT DETECTED NOT DETECTED Final   Metapneumovirus NOT DETECTED NOT DETECTED Final   Rhinovirus /  Enterovirus DETECTED (A) NOT DETECTED Final   Influenza A NOT DETECTED NOT DETECTED Final   Influenza A H1 NOT DETECTED NOT DETECTED Final   Influenza A H1 2009 NOT DETECTED NOT DETECTED Final   Influenza A H3 NOT DETECTED NOT DETECTED Final   Influenza B NOT DETECTED NOT DETECTED Final   Parainfluenza Virus 1 NOT DETECTED NOT DETECTED Final   Parainfluenza Virus 2 NOT DETECTED NOT DETECTED Final   Parainfluenza Virus 3 NOT DETECTED NOT DETECTED Final   Parainfluenza Virus 4 NOT DETECTED NOT DETECTED Final   Respiratory Syncytial Virus NOT DETECTED NOT DETECTED Final   Bordetella pertussis NOT DETECTED NOT DETECTED Final   Chlamydophila pneumoniae NOT DETECTED NOT DETECTED Final   Mycoplasma pneumoniae NOT DETECTED NOT DETECTED Final  MRSA PCR Screening     Status: None   Collection Time: 03/20/16  1:15 AM  Result Value Ref Range Status   MRSA by PCR NEGATIVE NEGATIVE Final    Comment:        The GeneXpert MRSA Assay (FDA approved for NASAL specimens only), is one component of a comprehensive MRSA colonization surveillance program. It is not intended to diagnose MRSA infection nor to guide or monitor treatment for MRSA infections.   Culture, sputum-assessment     Status: None   Collection Time: 03/20/16  7:42 PM  Result Value Ref Range Status   Specimen Description EXPECTORATED SPUTUM  Final   Special Requests Normal  Final   Sputum evaluation   Final    THIS SPECIMEN IS ACCEPTABLE. RESPIRATORY CULTURE REPORT TO FOLLOW.   Report Status 03/21/2016 FINAL  Final      Radiology Studies: No results found.   Scheduled Meds: . amLODipine  5 mg Oral Daily  . antiseptic oral rinse  7 mL Mouth Rinse BID  . aspirin EC  81 mg Oral Daily  . azithromycin  500 mg Intravenous Q24H  . cefTRIAXone (ROCEPHIN)  IV  2 g Intravenous Q24H  . guaiFENesin  600 mg Oral BID  . heparin subcutaneous  5,000 Units Subcutaneous Q8H  . ipratropium  0.5 mg Nebulization TID  . levalbuterol  1.25 mg  Nebulization TID  . lisinopril  10 mg Oral Daily  . metoprolol tartrate  25 mg Oral BID  . sodium chloride  1,000 mL Intravenous Once  . sodium chloride  flush  3 mL Intravenous Q12H   Continuous Infusions:     LOS: 2 days   Time Spent in minutes   30 minutes  Donn Wilmot D.O. on 03/22/2016 at 10:24 AM  Between 7am to 7pm - Pager - [phone]  After 7pm go to [email or web address] - password [email or web address]  And look for the night coverage person covering for me after hours  [facility] Office  [phone]

## 2016-03-23 ENCOUNTER — Inpatient Hospital Stay (HOSPITAL_COMMUNITY): Payer: BLUE CROSS/BLUE SHIELD

## 2016-03-23 LAB — CULTURE, RESPIRATORY: CULTURE: NORMAL

## 2016-03-23 LAB — NM MYOCAR MULTI W/SPECT W/WALL MOTION / EF
CSEPPHR: 112 {beats}/min
Rest HR: 76 {beats}/min

## 2016-03-23 LAB — BASIC METABOLIC PANEL
ANION GAP: 9 (ref 5–15)
BUN: 13 mg/dL (ref 6–20)
CO2: 27 mmol/L (ref 22–32)
Calcium: 8.6 mg/dL — ABNORMAL LOW (ref 8.9–10.3)
Chloride: 102 mmol/L (ref 101–111)
Creatinine, Ser: 1.22 mg/dL (ref 0.61–1.24)
Glucose, Bld: 113 mg/dL — ABNORMAL HIGH (ref 65–99)
POTASSIUM: 4 mmol/L (ref 3.5–5.1)
SODIUM: 138 mmol/L (ref 135–145)

## 2016-03-23 LAB — CULTURE, RESPIRATORY W GRAM STAIN

## 2016-03-23 LAB — CBC
HCT: 43.8 % (ref 39.0–52.0)
Hemoglobin: 13.9 g/dL (ref 13.0–17.0)
MCH: 30.9 pg (ref 26.0–34.0)
MCHC: 31.7 g/dL (ref 30.0–36.0)
MCV: 97.3 fL (ref 78.0–100.0)
PLATELETS: 278 10*3/uL (ref 150–400)
RBC: 4.5 MIL/uL (ref 4.22–5.81)
RDW: 14.1 % (ref 11.5–15.5)
WBC: 8.7 10*3/uL (ref 4.0–10.5)

## 2016-03-23 MED ORDER — FLUTICASONE PROPIONATE 50 MCG/ACT NA SUSP
2.0000 | Freq: Every day | NASAL | Status: DC
  Administered 2016-03-23 – 2016-03-26 (×4): 2 via NASAL
  Filled 2016-03-23: qty 16

## 2016-03-23 MED ORDER — TECHNETIUM TC 99M TETROFOSMIN IV KIT: 30.0000 | PACK | Freq: Once | INTRAVENOUS | Status: DC | PRN

## 2016-03-23 MED ORDER — AZITHROMYCIN 500 MG PO TABS
500.0000 mg | ORAL_TABLET | Freq: Every day | ORAL | Status: AC
  Administered 2016-03-23 – 2016-03-25 (×3): 500 mg via ORAL
  Filled 2016-03-23 (×4): qty 1

## 2016-03-23 NOTE — Progress Notes (Signed)
Patient ID: Luis [name], male   DOB: 08-Jan-1966, 50 y.o.   MRN: [id number]    Subjective: Still with cough going for resting study nuclear   Objective: Vital signs in last 24 hours: Temp:  [97.4 F (36.3 C)-98.7 F (37.1 C)] 97.9 F (36.6 C) (05/21 0620) Pulse Rate:  [71-100] 76 (05/21 0904) Resp:  [18-20] 18 (05/21 0904) BP: (121-157)/(77-99) 157/99 mmHg (05/21 0620) SpO2:  [94 %-96 %] 94 % (05/21 0904) Weight:  [156.808 kg (345 lb 11.2 oz)] 156.808 kg (345 lb 11.2 oz) (05/21 0620) Last BM Date: 03/22/16  Intake/Output from previous day: 05/20 0701 - 05/21 0700 In: -  Out: 300 [Urine:300] Intake/Output this shift:    Medications Scheduled Meds: . amLODipine  5 mg Oral Daily  . antiseptic oral rinse  7 mL Mouth Rinse BID  . aspirin EC  81 mg Oral Daily  . azithromycin  500 mg Intravenous Q24H  . benzonatate  200 mg Oral TID  . cefTRIAXone (ROCEPHIN)  IV  2 g Intravenous Q24H  . guaiFENesin  600 mg Oral BID  . heparin subcutaneous  5,000 Units Subcutaneous Q8H  . ipratropium  0.5 mg Nebulization TID  . levalbuterol  1.25 mg Nebulization TID  . lisinopril  10 mg Oral Daily  . metoprolol tartrate  25 mg Oral BID  . sodium chloride  1,000 mL Intravenous Once  . sodium chloride flush  3 mL Intravenous Q12H   Continuous Infusions:  PRN Meds:.acetaminophen **OR** acetaminophen, chlorpheniramine-HYDROcodone, hydrALAZINE, levalbuterol, ondansetron **OR** ondansetron (ZOFRAN) IV  PE: General appearance: alert, cooperative and no distress Lungs: Decreased BS bilaterally with course sounds. No wheeze  Heart: regular rate and rhythm, S1, S2 normal, no murmur, click, rub or gallop Extremities: No LEE Pulses: 2+ and symmetric Skin: Warm and dry Neurologic: Grossly normal  Lab Results:   Recent Labs  03/21/16 0504 03/22/16 0242 03/23/16 0319  WBC 11.0* 8.9 8.7  HGB 14.1 13.5 13.9  HCT 43.9 42.6 43.8  PLT 242 256 278   BMET  Recent Labs  03/21/16 0504  03/22/16 0242 03/23/16 0319  NA 141 139 138  K 4.1 4.2 4.0  CL 105 104 102  CO2 GLUCOSE 145* 108* 113*  BUN CREATININE 1.16 1.18 1.22  CALCIUM 8.5* 8.5* 8.6*   PT/INR No results for input(s): LABPROT, INR in the last 72 hours.    Assessment/Plan   Active Problems:   Hypertension   SIRS (systemic inflammatory response syndrome) (HCC)   Elevated troponin   A-fib (HCC)   CAP (community acquired pneumonia)   CHF (congestive heart failure) (HCC)   Cough   Dyspnea   Sepsis (HCC)   SOB (shortness of breath)   Tachycardia   Tachypnea  1. Multifocal PNA: Pt presented with a three day h/o progressive dyspnea, cough, and diaphoresis. Chest CT showed multifocal PNA. Abx per IM.  2. Elevated troponin: In setting of pneumonia. Pt has had sinus tachycardia with frequent PAC's and also chest tightness with a pleuritic component - worse with coughing. His troponin is mildly elevated with a flat trend of 0.08  0.08  0.10. Suspect demand ischemia in setting of #1. No prior h/o CAD. RF include HTN, obesity, and h/o tobacco abuse. Cont ASA and beta blocker. Echo EF 35-40% with diffuse hypokinesis. G2DD  Per Dr Gibson Ramp note, 2D myovue to be done today further recommendations based on results   3. Essential HTN: BP a little elevated.  Currently on amlodipine and metoprolol.   4. Likely OSA: outpatient sleep study.    LOS: 3 days    Charlton Haws

## 2016-03-23 NOTE — Progress Notes (Signed)
PROGRESS NOTE    Luis [name]  ZOX:[id number] DOB: [birth year] DOA: 03/19/2016 PCP: [name], MD    Brief Narrative:  50 year old male history of hypertension who presented with 1 week of cough and shortness of breath. Patient was presumably treated for pneumonia 2-3 months ago with doxycycline. Admitted for pneumonia and atrial fibrillation. Cardiology consulted.  Assessment & Plan   Sepsis secondary to community-acquired pneumonia/Rhinovirus -Upon admission, patient was tachycardic, tachypneic, with leukocytosis -CTA chest no evidence of pulmonary embolism, multifocal pneumonia versus bronchopneumonia -Leukocytosis resolved -Urine strep pneumonia Ag negative -Continue azithromycin and ceftriaxone -Sputum culture and blood cultures pending  -Respiratory viral panel + rhinovirus -Continue antitussives, scheduled and as needed  Sinus tachycardia -Thought to be A. fib however rhythm does not show atrial for, shows sinus rhythm with [facility] -Cardiology consulted and appreciated -Echocardiogram EF 35-40%, grade 2 diastolic dysfunction; moderate global reduction in LV function, moderate LVH and LVE, mild LAE -TSH 0.777  Elevated troponin -Likely secondary to the above -Troponin peak at 0.1 -Likely demand ischemia -Echocardiogram as above -heparin drip discontinued, place on DVT prophylactic dose  New Combined systolic/diastolic HF -Echocardiogram as above -will discontinue IVF -BNP 97 -Patient does not appear to be volume overloaded -Started low dose lisinorpil -Monitor intake/output, daily weights -Spoke with cardiology on 5/20, recommended myoview, currently pending  Essential hypertension -Supposedly patient ran out of his medications -Continue Norvasc and metoprolol, lisinopril  Morbid obesity -Upon discharge patient will need to discuss with his primary care physician last modifications including diet and exercise -BMI 43.7  Possible sleep apnea -Will need  outpatient sleep study  DVT Prophylaxis  heparin  Code Status: Full  Family Communication: None at bedside  Disposition Plan: Admitted. Pending [facility]  Consultants Cardiology   Procedures  Echocardiogram  Antibiotics   Anti-infectives    Start     Dose/Rate Route Frequency Ordered Stop   03/23/16 1800  azithromycin (ZITHROMAX) tablet 500 mg     500 mg Oral Daily-1800 03/23/16 1015 03/26/16 1759   03/20/16 2200  cefTRIAXone (ROCEPHIN) 1 g in dextrose 5 % 50 mL IVPB  Status:  Discontinued     1 g 100 mL/hr over 30 Minutes Intravenous Every 24 hours 03/19/16 2219 03/19/16 2239   03/20/16 2200  cefTRIAXone (ROCEPHIN) 2 g in dextrose 5 % 50 mL IVPB     2 g 100 mL/hr over 30 Minutes Intravenous Every 24 hours 03/19/16 2239 03/26/16 2159   03/20/16 2000  azithromycin (ZITHROMAX) 500 mg in dextrose 5 % 250 mL IVPB  Status:  Discontinued     500 mg 250 mL/hr over 60 Minutes Intravenous Every 24 hours 03/19/16 2219 03/23/16 1014   03/19/16 1745  cefTRIAXone (ROCEPHIN) 1 g in dextrose 5 % 50 mL IVPB     1 g 100 mL/hr over 30 Minutes Intravenous  Once 03/19/16 1730 03/19/16 1912   03/19/16 1745  azithromycin (ZITHROMAX) 500 mg in dextrose 5 % 250 mL IVPB     500 mg 250 mL/hr over 60 Minutes Intravenous  Once 03/19/16 1730 03/19/16 1943      Subjective:   Luis [name] seen and examined today.   Continues to complain of productive cough and had some shortness of breath. Denies currently chest pain or palpitations.  Denies abdominal pain, nausea or vomiting, diarrhea or constipation.  Objective:   Filed Vitals:   03/22/16 2057 03/22/16 2136 03/23/16 0620 03/23/16 0904  BP: 121/78  157/99   Pulse: 82 96 71 76  Temp:  98.7 F (37.1 C)  97.9 F (36.6 C)   TempSrc: Oral  Oral   Resp: 20 20 18 18   Height:      Weight:   156.808 kg (345 lb 11.2 oz)   SpO2: 95%  96% 94%   No intake or output data in the 24 hours ending 03/23/16 1039 Filed Weights   03/21/16 0407 03/22/16 0428  03/23/16 0620  Weight: 157.171 kg (346 lb 8 oz) 157.67 kg (347 lb 9.6 oz) 156.808 kg (345 lb 11.2 oz)    Exam  General: Well developed, morbidly obese,  NAD  HEENT: NCAT,mucous membranes moist.   Cardiovascular: S1 S2 auscultated, RRR, no murmur  Respiratory: Diminished breath sounds, no wheezing or crackles. Occ cough  Abdomen: Soft, obese, nontender, nondistended, + bowel sounds  Extremities: warm dry without cyanosis clubbing or edema  Neuro: AAOx3, nonfocal  Psych: Normal affect and demeanor, pleasant   Data Reviewed: I have personally reviewed following labs and imaging studies  CBC:  Recent Labs Lab 03/19/16 1707 03/20/16 0725 03/21/16 0504 03/22/16 0242 03/23/16 0319  WBC 14.6* 15.2* 11.0* 8.9 8.7  HGB 15.6 14.5 14.1 13.5 13.9  HCT 46.0 44.3 43.9 42.6 43.8  MCV 95.6 95.7 97.1 97.9 97.3  PLT 263 255 242 256 278   Basic Metabolic Panel:  Recent Labs Lab 03/19/16 1707 03/20/16 0725 03/21/16 0504 03/22/16 0242 03/23/16 0319  NA 138 140 141 139 138  K 3.8 3.5 4.1 4.2 4.0  CL 102 102 105 104 102  CO2 24 24 25 25 27   GLUCOSE 112* 128* 145* 108* 113*  BUN 10 7 11 13 13   CREATININE 1.03 1.12 1.16 1.18 1.22  CALCIUM 8.6* 8.4* 8.5* 8.5* 8.6*  MG  --  2.0  --   --   --   PHOS  --  3.8  --   --   --    GFR: Estimated Creatinine Clearance: 118.8 mL/min (by C-G formula based on Cr of 1.22). Liver Function Tests:  Recent Labs Lab 03/19/16 1733 03/20/16 0725  AST 23 18  ALT 21 19  ALKPHOS 110 108  BILITOT 0.9 0.8  PROT 7.4 7.3  ALBUMIN 3.3* 3.3*   No results for input(s): LIPASE, AMYLASE in the last 168 hours. No results for input(s): AMMONIA in the last 168 hours. Coagulation Profile:  Recent Labs Lab 03/19/16 2010  INR 1.15   Cardiac Enzymes:  Recent Labs Lab 03/19/16 2010 03/20/16 0046 03/20/16 0725  TROPONINI 0.08* 0.08* 0.10*   BNP (last 3 results) No results for input(s): PROBNP in the last 8760 hours. HbA1C: No results for  input(s): HGBA1C in the last 72 hours. CBG: No results for input(s): GLUCAP in the last 168 hours. Lipid Profile: No results for input(s): CHOL, HDL, LDLCALC, TRIG, CHOLHDL, LDLDIRECT in the last 72 hours. Thyroid Function Tests: No results for input(s): TSH, T4TOTAL, FREET4, T3FREE, THYROIDAB in the last 72 hours. Anemia Panel: No results for input(s): VITAMINB12, FOLATE, FERRITIN, TIBC, IRON, RETICCTPCT in the last 72 hours. Urine analysis:    Component Value Date/Time   COLORURINE YELLOW 03/19/2016 2121   APPEARANCEUR CLEAR 03/19/2016 2121   LABSPEC 1.015 03/19/2016 2121   PHURINE 7.5 03/19/2016 2121   GLUCOSEU NEGATIVE 03/19/2016 2121   HGBUR NEGATIVE 03/19/2016 2121   BILIRUBINUR NEGATIVE 03/19/2016 2121   KETONESUR NEGATIVE 03/19/2016 2121   PROTEINUR NEGATIVE 03/19/2016 2121   NITRITE NEGATIVE 03/19/2016 2121   LEUKOCYTESUR NEGATIVE 03/19/2016 2121   Sepsis Labs: @LABRCNTIP (procalcitonin:4,lacticidven:4)  )  Recent Results (from the past 240 hour(s))  Blood Culture (routine x 2)     Status: None (Preliminary result)   Collection Time: 03/19/16  5:33 PM  Result Value Ref Range Status   Specimen Description BLOOD RIGHT FOREARM  Final   Special Requests BOTTLES DRAWN AEROBIC AND ANAEROBIC 5CC  Final   Culture NO GROWTH 3 DAYS  Final   Report Status PENDING  Incomplete  Blood Culture (routine x 2)     Status: None (Preliminary result)   Collection Time: 03/19/16  6:40 PM  Result Value Ref Range Status   Specimen Description BLOOD LEFT ANTECUBITAL  Final   Special Requests BOTTLES DRAWN AEROBIC AND ANAEROBIC 5CC  Final   Culture NO GROWTH 3 DAYS  Final   Report Status PENDING  Incomplete  Respiratory Panel by PCR     Status: Abnormal   Collection Time: 03/19/16 11:59 PM  Result Value Ref Range Status   Adenovirus NOT DETECTED NOT DETECTED Final   Coronavirus 229E NOT DETECTED NOT DETECTED Final   Coronavirus HKU1 NOT DETECTED NOT DETECTED Final   Coronavirus NL63 NOT  DETECTED NOT DETECTED Final   Coronavirus OC43 NOT DETECTED NOT DETECTED Final   Metapneumovirus NOT DETECTED NOT DETECTED Final   Rhinovirus / Enterovirus DETECTED (A) NOT DETECTED Final   Influenza A NOT DETECTED NOT DETECTED Final   Influenza A H1 NOT DETECTED NOT DETECTED Final   Influenza A H1 2009 NOT DETECTED NOT DETECTED Final   Influenza A H3 NOT DETECTED NOT DETECTED Final   Influenza B NOT DETECTED NOT DETECTED Final   Parainfluenza Virus 1 NOT DETECTED NOT DETECTED Final   Parainfluenza Virus 2 NOT DETECTED NOT DETECTED Final   Parainfluenza Virus 3 NOT DETECTED NOT DETECTED Final   Parainfluenza Virus 4 NOT DETECTED NOT DETECTED Final   Respiratory Syncytial Virus NOT DETECTED NOT DETECTED Final   Bordetella pertussis NOT DETECTED NOT DETECTED Final   Chlamydophila pneumoniae NOT DETECTED NOT DETECTED Final   Mycoplasma pneumoniae NOT DETECTED NOT DETECTED Final  MRSA PCR Screening     Status: None   Collection Time: 03/20/16  1:15 AM  Result Value Ref Range Status   MRSA by PCR NEGATIVE NEGATIVE Final    Comment:        The GeneXpert MRSA Assay (FDA approved for NASAL specimens only), is one component of a comprehensive MRSA colonization surveillance program. It is not intended to diagnose MRSA infection nor to guide or monitor treatment for MRSA infections.   Culture, sputum-assessment     Status: None   Collection Time: 03/20/16  7:42 PM  Result Value Ref Range Status   Specimen Description EXPECTORATED SPUTUM  Final   Special Requests Normal  Final   Sputum evaluation   Final    THIS SPECIMEN IS ACCEPTABLE. RESPIRATORY CULTURE REPORT TO FOLLOW.   Report Status 03/21/2016 FINAL  Final  Culture, respiratory (NON-Expectorated)     Status: None (Preliminary result)   Collection Time: 03/20/16  7:42 PM  Result Value Ref Range Status   Specimen Description EXPECTORATED SPUTUM  Final   Special Requests NONE  Final   Gram Stain   Final    ABUNDANT WBC  PRESENT,BOTH PMN AND MONONUCLEAR FEW SQUAMOUS EPITHELIAL CELLS PRESENT MODERATE GRAM POSITIVE COCCI IN PAIRS IN CLUSTERS FEW GRAM NEGATIVE RODS THIS SPECIMEN IS ACCEPTABLE FOR SPUTUM CULTURE Performed at [facility]    Culture   Final    Culture reincubated for better growth Performed at  [facility]    Report Status PENDING  Incomplete      Radiology Studies: No results found.   Scheduled Meds: . amLODipine  5 mg Oral Daily  . antiseptic oral rinse  7 mL Mouth Rinse BID  . aspirin EC  81 mg Oral Daily  . azithromycin  500 mg Oral q1800  . benzonatate  200 mg Oral TID  . cefTRIAXone (ROCEPHIN)  IV  2 g Intravenous Q24H  . guaiFENesin  600 mg Oral BID  . heparin subcutaneous  5,000 Units Subcutaneous Q8H  . ipratropium  0.5 mg Nebulization TID  . levalbuterol  1.25 mg Nebulization TID  . lisinopril  10 mg Oral Daily  . metoprolol tartrate  25 mg Oral BID  . sodium chloride  1,000 mL Intravenous Once  . sodium chloride flush  3 mL Intravenous Q12H   Continuous Infusions:     LOS: 3 days   Time Spent in minutes   30 minutes  Teran Daughenbaugh D.O. on 03/23/2016 at 10:39 AM  Between 7am to 7pm - Pager - [phone]  After 7pm go to [email or web address] - password [email or web address]  And look for the night coverage person covering for me after hours  [facility] Office  [phone]

## 2016-03-24 ENCOUNTER — Encounter (HOSPITAL_COMMUNITY)
Admission: EM | Disposition: A | Discharge status: Home / Self Care | Payer: Self-pay | Source: Home / Self Care | Attending: Internal Medicine

## 2016-03-24 DIAGNOSIS — I10 Essential (primary) hypertension: Secondary | ICD-10-CM

## 2016-03-24 DIAGNOSIS — I2510 Atherosclerotic heart disease of native coronary artery without angina pectoris: Secondary | ICD-10-CM

## 2016-03-24 DIAGNOSIS — R6510 Systemic inflammatory response syndrome (SIRS) of non-infectious origin without acute organ dysfunction: Secondary | ICD-10-CM

## 2016-03-24 DIAGNOSIS — I5030 Unspecified diastolic (congestive) heart failure: Secondary | ICD-10-CM

## 2016-03-24 DIAGNOSIS — R9439 Abnormal result of other cardiovascular function study: Secondary | ICD-10-CM | POA: Clinically undetermined

## 2016-03-24 DIAGNOSIS — R000 Tachycardia, unspecified: Secondary | ICD-10-CM

## 2016-03-24 DIAGNOSIS — R9431 Abnormal electrocardiogram [ECG] [EKG]: Secondary | ICD-10-CM

## 2016-03-24 DIAGNOSIS — I5032 Chronic diastolic (congestive) heart failure: Secondary | ICD-10-CM | POA: Diagnosis present

## 2016-03-24 HISTORY — PX: CARDIAC CATHETERIZATION: SHX172

## 2016-03-24 LAB — BASIC METABOLIC PANEL
ANION GAP: 9 (ref 5–15)
BUN: 11 mg/dL (ref 6–20)
CHLORIDE: 104 mmol/L (ref 101–111)
CO2: 24 mmol/L (ref 22–32)
Calcium: 8.7 mg/dL — ABNORMAL LOW (ref 8.9–10.3)
Creatinine, Ser: 1.04 mg/dL (ref 0.61–1.24)
GFR calc Af Amer: >60 mL/min (ref >60)
Glucose, Bld: 127 mg/dL — ABNORMAL HIGH (ref 65–99)
POTASSIUM: 3.6 mmol/L (ref 3.5–5.1)
SODIUM: 137 mmol/L (ref 135–145)

## 2016-03-24 LAB — LIPID PANEL
Cholesterol: 164 mg/dL (ref 0–200)
HDL: 23 mg/dL — ABNORMAL LOW (ref >40)
LDL Cholesterol: 102 mg/dL — ABNORMAL HIGH (ref 0–99)
Total CHOL/HDL Ratio: 7.1 RATIO
Triglycerides: 193 mg/dL — ABNORMAL HIGH (ref <150)
VLDL: 39 mg/dL (ref 0–40)

## 2016-03-24 LAB — CULTURE, BLOOD (ROUTINE X 2)
CULTURE: NO GROWTH
Culture: NO GROWTH

## 2016-03-24 LAB — CBC
HCT: 43.1 % (ref 39.0–52.0)
HEMOGLOBIN: 13.8 g/dL (ref 13.0–17.0)
MCH: 30.7 pg (ref 26.0–34.0)
MCHC: 32 g/dL (ref 30.0–36.0)
MCV: 95.8 fL (ref 78.0–100.0)
Platelets: 283 10*3/uL (ref 150–400)
RBC: 4.5 MIL/uL (ref 4.22–5.81)
RDW: 14.1 % (ref 11.5–15.5)
WBC: 7.8 10*3/uL (ref 4.0–10.5)

## 2016-03-24 SURGERY — LEFT HEART CATH AND CORONARY ANGIOGRAPHY

## 2016-03-24 MED ORDER — HEPARIN (PORCINE) IN NACL 2-0.9 UNIT/ML-% IJ SOLN
INTRAMUSCULAR | Status: AC
  Filled 2016-03-24: qty 1000

## 2016-03-24 MED ORDER — HYDRALAZINE HCL 20 MG/ML IJ SOLN
INTRAMUSCULAR | Status: AC
  Filled 2016-03-24: qty 1

## 2016-03-24 MED ORDER — IPRATROPIUM BROMIDE 0.02 % IN SOLN: 0.5000 mg | RESPIRATORY_TRACT | Status: DC | PRN

## 2016-03-24 MED ORDER — HEPARIN SODIUM (PORCINE) 1000 UNIT/ML IJ SOLN
INTRAMUSCULAR | Status: AC
  Filled 2016-03-24: qty 1

## 2016-03-24 MED ORDER — SODIUM CHLORIDE 0.9% FLUSH: 3.0000 mL | INTRAVENOUS | Status: DC | PRN

## 2016-03-24 MED ORDER — SODIUM CHLORIDE 0.9 % WEIGHT BASED INFUSION
3.0000 mL/kg/h | INTRAVENOUS | Status: DC
  Administered 2016-03-24: 3 mL/kg/h via INTRAVENOUS

## 2016-03-24 MED ORDER — MIDAZOLAM HCL 2 MG/2ML IJ SOLN
INTRAMUSCULAR | Status: AC
  Filled 2016-03-24: qty 2

## 2016-03-24 MED ORDER — HEPARIN (PORCINE) IN NACL 2-0.9 UNIT/ML-% IJ SOLN
INTRAMUSCULAR | Status: DC | PRN
  Administered 2016-03-24: 1000 mL

## 2016-03-24 MED ORDER — LIDOCAINE HCL (PF) 1 % IJ SOLN
INTRAMUSCULAR | Status: AC
  Filled 2016-03-24: qty 30

## 2016-03-24 MED ORDER — SODIUM CHLORIDE 0.9 % IV SOLN: 250.0000 mL | INTRAVENOUS | Status: DC | PRN

## 2016-03-24 MED ORDER — LABETALOL HCL 5 MG/ML IV SOLN
INTRAVENOUS | Status: DC | PRN
  Administered 2016-03-24: 20 mg via INTRAVENOUS

## 2016-03-24 MED ORDER — FENTANYL CITRATE (PF) 100 MCG/2ML IJ SOLN
INTRAMUSCULAR | Status: AC
  Filled 2016-03-24: qty 2

## 2016-03-24 MED ORDER — MIDAZOLAM HCL 2 MG/2ML IJ SOLN
INTRAMUSCULAR | Status: DC | PRN
  Administered 2016-03-24: 1 mg via INTRAVENOUS

## 2016-03-24 MED ORDER — HEPARIN SODIUM (PORCINE) 1000 UNIT/ML IJ SOLN
INTRAMUSCULAR | Status: DC | PRN
  Administered 2016-03-24: 6000 [IU] via INTRAVENOUS

## 2016-03-24 MED ORDER — PANTOPRAZOLE SODIUM 40 MG PO TBEC
40.0000 mg | DELAYED_RELEASE_TABLET | Freq: Every day | ORAL | Status: DC
  Administered 2016-03-24 – 2016-03-26 (×3): 40 mg via ORAL
  Filled 2016-03-24 (×3): qty 1

## 2016-03-24 MED ORDER — HEPARIN (PORCINE) IN NACL 100-0.45 UNIT/ML-% IJ SOLN
2100.0000 [IU]/h | INTRAMUSCULAR | Status: DC
  Administered 2016-03-24: 2100 [IU]/h via INTRAVENOUS
  Filled 2016-03-24: qty 250

## 2016-03-24 MED ORDER — FUROSEMIDE 10 MG/ML IJ SOLN
40.0000 mg | Freq: Once | INTRAMUSCULAR | Status: AC
  Administered 2016-03-24: 40 mg via INTRAVENOUS
  Filled 2016-03-24: qty 4

## 2016-03-24 MED ORDER — SODIUM CHLORIDE 0.9% FLUSH
3.0000 mL | Freq: Two times a day (BID) | INTRAVENOUS | Status: DC
Start: 2016-03-24 — End: 2016-03-24
  Administered 2016-03-24: 3 mL via INTRAVENOUS

## 2016-03-24 MED ORDER — ENOXAPARIN SODIUM 40 MG/0.4ML ~~LOC~~ SOLN
40.0000 mg | SUBCUTANEOUS | Status: DC
  Administered 2016-03-25: 40 mg via SUBCUTANEOUS
  Filled 2016-03-24 (×2): qty 0.4

## 2016-03-24 MED ORDER — IOPAMIDOL (ISOVUE-370) INJECTION 76%
INTRAVENOUS | Status: DC | PRN
  Administered 2016-03-24: 90 mL via INTRA_ARTERIAL

## 2016-03-24 MED ORDER — SODIUM CHLORIDE 0.9% FLUSH: 3.0000 mL | Freq: Two times a day (BID) | INTRAVENOUS | Status: DC

## 2016-03-24 MED ORDER — LIDOCAINE HCL (PF) 1 % IJ SOLN
INTRAMUSCULAR | Status: DC | PRN
  Administered 2016-03-24: 2 mL

## 2016-03-24 MED ORDER — VERAPAMIL HCL 2.5 MG/ML IV SOLN
INTRAVENOUS | Status: DC | PRN
  Administered 2016-03-24: 18:00:00 via INTRA_ARTERIAL

## 2016-03-24 MED ORDER — SODIUM CHLORIDE 0.9 % WEIGHT BASED INFUSION
1.0000 mL/kg/h | INTRAVENOUS | Status: DC
  Administered 2016-03-24: 1 mL/kg/h via INTRAVENOUS

## 2016-03-24 MED ORDER — IOPAMIDOL (ISOVUE-370) INJECTION 76%
INTRAVENOUS | Status: AC
  Filled 2016-03-24: qty 100

## 2016-03-24 MED ORDER — LABETALOL HCL 5 MG/ML IV SOLN
INTRAVENOUS | Status: AC
  Filled 2016-03-24: qty 4

## 2016-03-24 MED ORDER — HYDRALAZINE HCL 20 MG/ML IJ SOLN
INTRAMUSCULAR | Status: DC | PRN
  Administered 2016-03-24: 20 mg via INTRAVENOUS

## 2016-03-24 MED ORDER — FENTANYL CITRATE (PF) 100 MCG/2ML IJ SOLN
INTRAMUSCULAR | Status: DC | PRN
  Administered 2016-03-24: 50 ug via INTRAVENOUS

## 2016-03-24 MED ORDER — SODIUM CHLORIDE 0.9% FLUSH
3.0000 mL | Freq: Two times a day (BID) | INTRAVENOUS | Status: DC
  Administered 2016-03-24: 3 mL via INTRAVENOUS

## 2016-03-24 MED ORDER — LEVALBUTEROL HCL 1.25 MG/0.5ML IN NEBU: 1.2500 mg | INHALATION_SOLUTION | RESPIRATORY_TRACT | Status: DC | PRN

## 2016-03-24 MED ORDER — VERAPAMIL HCL 2.5 MG/ML IV SOLN
INTRAVENOUS | Status: AC
  Filled 2016-03-24: qty 2

## 2016-03-24 SURGICAL SUPPLY — 12 items
CATH INFINITI 5 FR JL3.5 (CATHETERS) ×2 IMPLANT
CATH INFINITI JR4 5F (CATHETERS) ×2 IMPLANT
CATH LAUNCHER 5F RADR (CATHETERS) ×1 IMPLANT
CATH OPTITORQUE TIG 4.0 5F (CATHETERS) ×2 IMPLANT
CATHETER LAUNCHER 5F RADR (CATHETERS) ×2
DEVICE RAD COMP TR BAND LRG (VASCULAR PRODUCTS) ×2 IMPLANT
GLIDESHEATH SLEND A-KIT 6F 22G (SHEATH) ×2 IMPLANT
KIT HEART LEFT (KITS) ×2 IMPLANT
PACK CARDIAC CATHETERIZATION (CUSTOM PROCEDURE TRAY) ×2 IMPLANT
TRANSDUCER W/STOPCOCK (MISCELLANEOUS) ×2 IMPLANT
TUBING CIL FLEX 10 FLL-RA (TUBING) ×2 IMPLANT
WIRE SAFE-T 1.5MM-J .035X260CM (WIRE) ×2 IMPLANT

## 2016-03-24 NOTE — Progress Notes (Signed)
PROGRESS NOTE    Luis [name]  ZOX:[id number] DOB: 1966-03-26 DOA: 03/19/2016 PCP: [name], MD    Brief Narrative:  50 year old male history of hypertension who presented with 1 week of cough and shortness of breath. Patient was presumably treated for pneumonia 2-3 months ago with doxycycline. Admitted for pneumonia and atrial fibrillation. Cardiology consulted.  Assessment & Plan   Sepsis secondary to community-acquired pneumonia/Rhinovirus -Upon admission, patient was tachycardic, tachypneic, with leukocytosis -CTA chest no evidence of pulmonary embolism, multifocal pneumonia versus bronchopneumonia -Leukocytosis resolved -Urine strep pneumonia Ag negative -Continue azithromycin and ceftriaxone -Sputum culture and blood cultures pending  -Respiratory viral panel + rhinovirus -Continue antitussives, scheduled and as needed  Sinus tachycardia -Resolved -Thought to be A. fib however rhythm does not show atrial for, shows sinus rhythm with [facility] -Cardiology consulted and appreciated -Echocardiogram EF 35-40%, grade 2 diastolic dysfunction; moderate global reduction in LV function, moderate LVH and LVE, mild LAE -TSH 0.777  Elevated troponin -Likely secondary to the above -Troponin peak at 0.1 -Likely demand ischemia -Echocardiogram as above -heparin drip discontinued, place on DVT prophylactic dose -Myoview: EF 41%, high risk study, large Defect of mild severity basal inferior and mid inferior location. Small defect severity present a lateral location. -Cardiology planning on cardiac catheterization  New Combined systolic/diastolic HF -Echocardiogram as above -will discontinue IVF -BNP 97 -Patient does not appear to be volume overloaded -Started low dose lisinorpil -Monitor intake/output, daily weights -Spoke with cardiology on 5/20, recommended myoview- results as above   Essential hypertension -Supposedly patient ran out of his medications -Continue  Norvasc and metoprolol, lisinopril  Morbid obesity -Upon discharge patient will need to discuss with his primary care physician last modifications including diet and exercise -BMI 43.7  Possible sleep apnea -Will need outpatient sleep study  DVT Prophylaxis  heparin  Code Status: Full  Family Communication: None at bedside  Disposition Plan: Admitted. Pending cardiac catherization   Consultants Cardiology   Procedures  Echocardiogram Myoview  Antibiotics   Anti-infectives    Start     Dose/Rate Route Frequency Ordered Stop   03/23/16 1800  azithromycin (ZITHROMAX) tablet 500 mg     500 mg Oral Daily-1800 03/23/16 1015 03/26/16 1759   03/20/16 2200  cefTRIAXone (ROCEPHIN) 1 g in dextrose 5 % 50 mL IVPB  Status:  Discontinued     1 g 100 mL/hr over 30 Minutes Intravenous Every 24 hours 03/19/16 2219 03/19/16 2239   03/20/16 2200  cefTRIAXone (ROCEPHIN) 2 g in dextrose 5 % 50 mL IVPB     2 g 100 mL/hr over 30 Minutes Intravenous Every 24 hours 03/19/16 2239 03/26/16 2159   03/20/16 2000  azithromycin (ZITHROMAX) 500 mg in dextrose 5 % 250 mL IVPB  Status:  Discontinued     500 mg 250 mL/hr over 60 Minutes Intravenous Every 24 hours 03/19/16 2219 03/23/16 1014   03/19/16 1745  cefTRIAXone (ROCEPHIN) 1 g in dextrose 5 % 50 mL IVPB     1 g 100 mL/hr over 30 Minutes Intravenous  Once 03/19/16 1730 03/19/16 1912   03/19/16 1745  azithromycin (ZITHROMAX) 500 mg in dextrose 5 % 250 mL IVPB     500 mg 250 mL/hr over 60 Minutes Intravenous  Once 03/19/16 1730 03/19/16 1943      Subjective:   Luis [name] seen and examined today.   Feels breathing has improved. Continues to cough.  Denies chest pain or palpitations, abdominal pain, nausea or vomiting, diarrhea or constipation.  Objective:  Filed Vitals:   03/23/16 2046 03/24/16 0425 03/24/16 0916 03/24/16 1134  BP: 139/86 134/85    Pulse: 76 71    Temp: 97.9 F (36.6 C) 98.1 F (36.7 C)    TempSrc: Oral Oral     Resp: 18 18    Height:      Weight:  155.538 kg (342 lb 14.4 oz)    SpO2: 98% 98% 96% 95%    Intake/Output Summary (Last 24 hours) at 03/24/16 1214 Last data filed at 03/24/16 0900  Gross per 24 hour  Intake    480 ml  Output      0 ml  Net    480 ml   Filed Weights   03/22/16 0428 03/23/16 0620 03/24/16 0425  Weight: 157.67 kg (347 lb 9.6 oz) 156.808 kg (345 lb 11.2 oz) 155.538 kg (342 lb 14.4 oz)    Exam  General: Well developed, morbidly obese, no distress  HEENT: NCAT,mucous membranes moist.   Cardiovascular: S1 S2 auscultated, RRR, no murmur  Respiratory: Diminished breath sounds, no wheezing or crackles. Occ cough  Abdomen: Soft, obese, nontender, nondistended, + bowel sounds  Extremities: warm dry without cyanosis clubbing or edema  Neuro: AAOx3, nonfocal  Psych: Normal affect and demeanor, pleasant   Data Reviewed: I have personally reviewed following labs and imaging studies  CBC:  Recent Labs Lab 03/20/16 0725 03/21/16 0504 03/22/16 0242 03/23/16 0319 03/24/16 0328  WBC 15.2* 11.0* 8.9 8.7 7.8  HGB 14.5 14.1 13.5 13.9 13.8  HCT 44.3 43.9 42.6 43.8 43.1  MCV 95.7 97.1 97.9 97.3 95.8  PLT 255 242 256 278 283   Basic Metabolic Panel:  Recent Labs Lab 03/20/16 0725 03/21/16 0504 03/22/16 0242 03/23/16 0319 03/24/16 0328  NA 140 141 139 138 137  K 3.5 4.1 4.2 4.0 3.6  CL 102 105 104 102 104  CO2 24 25 25 27 24   GLUCOSE 128* 145* 108* 113* 127*  BUN 7 11 13 13 11   CREATININE 1.12 1.16 1.18 1.22 1.04  CALCIUM 8.4* 8.5* 8.5* 8.6* 8.7*  MG 2.0  --   --   --   --   PHOS 3.8  --   --   --   --    GFR: Estimated Creatinine Clearance: 138.7 mL/min (by C-G formula based on Cr of 1.04). Liver Function Tests:  Recent Labs Lab 03/19/16 1733 03/20/16 0725  AST 23 18  ALT 21 19  ALKPHOS 110 108  BILITOT 0.9 0.8  PROT 7.4 7.3  ALBUMIN 3.3* 3.3*   No results for input(s): LIPASE, AMYLASE in the last 168 hours. No results for input(s):  AMMONIA in the last 168 hours. Coagulation Profile:  Recent Labs Lab 03/19/16 2010  INR 1.15   Cardiac Enzymes:  Recent Labs Lab 03/19/16 2010 03/20/16 0046 03/20/16 0725  TROPONINI 0.08* 0.08* 0.10*   BNP (last 3 results) No results for input(s): PROBNP in the last 8760 hours. HbA1C: No results for input(s): HGBA1C in the last 72 hours. CBG: No results for input(s): GLUCAP in the last 168 hours. Lipid Profile: No results for input(s): CHOL, HDL, LDLCALC, TRIG, CHOLHDL, LDLDIRECT in the last 72 hours. Thyroid Function Tests: No results for input(s): TSH, T4TOTAL, FREET4, T3FREE, THYROIDAB in the last 72 hours. Anemia Panel: No results for input(s): VITAMINB12, FOLATE, FERRITIN, TIBC, IRON, RETICCTPCT in the last 72 hours. Urine analysis:    Component Value Date/Time   COLORURINE YELLOW 03/19/2016 2121   APPEARANCEUR CLEAR 03/19/2016 2121  LABSPEC 1.015 03/19/2016 2121   PHURINE 7.5 03/19/2016 2121   GLUCOSEU NEGATIVE 03/19/2016 2121   HGBUR NEGATIVE 03/19/2016 2121   BILIRUBINUR NEGATIVE 03/19/2016 2121   KETONESUR NEGATIVE 03/19/2016 2121   PROTEINUR NEGATIVE 03/19/2016 2121   NITRITE NEGATIVE 03/19/2016 2121   LEUKOCYTESUR NEGATIVE 03/19/2016 2121   Sepsis Labs: @LABRCNTIP (procalcitonin:4,lacticidven:4)  ) Recent Results (from the past 240 hour(s))  Blood Culture (routine x 2)     Status: None   Collection Time: 03/19/16  5:33 PM  Result Value Ref Range Status   Specimen Description BLOOD RIGHT FOREARM  Final   Special Requests BOTTLES DRAWN AEROBIC AND ANAEROBIC 5CC  Final   Culture NO GROWTH 5 DAYS  Final   Report Status 03/24/2016 FINAL  Final  Blood Culture (routine x 2)     Status: None   Collection Time: 03/19/16  6:40 PM  Result Value Ref Range Status   Specimen Description BLOOD LEFT ANTECUBITAL  Final   Special Requests BOTTLES DRAWN AEROBIC AND ANAEROBIC 5CC  Final   Culture NO GROWTH 5 DAYS  Final   Report Status 03/24/2016 FINAL  Final   Respiratory Panel by PCR     Status: Abnormal   Collection Time: 03/19/16 11:59 PM  Result Value Ref Range Status   Adenovirus NOT DETECTED NOT DETECTED Final   Coronavirus 229E NOT DETECTED NOT DETECTED Final   Coronavirus HKU1 NOT DETECTED NOT DETECTED Final   Coronavirus NL63 NOT DETECTED NOT DETECTED Final   Coronavirus OC43 NOT DETECTED NOT DETECTED Final   Metapneumovirus NOT DETECTED NOT DETECTED Final   Rhinovirus / Enterovirus DETECTED (A) NOT DETECTED Final   Influenza A NOT DETECTED NOT DETECTED Final   Influenza A H1 NOT DETECTED NOT DETECTED Final   Influenza A H1 2009 NOT DETECTED NOT DETECTED Final   Influenza A H3 NOT DETECTED NOT DETECTED Final   Influenza B NOT DETECTED NOT DETECTED Final   Parainfluenza Virus 1 NOT DETECTED NOT DETECTED Final   Parainfluenza Virus 2 NOT DETECTED NOT DETECTED Final   Parainfluenza Virus 3 NOT DETECTED NOT DETECTED Final   Parainfluenza Virus 4 NOT DETECTED NOT DETECTED Final   Respiratory Syncytial Virus NOT DETECTED NOT DETECTED Final   Bordetella pertussis NOT DETECTED NOT DETECTED Final   Chlamydophila pneumoniae NOT DETECTED NOT DETECTED Final   Mycoplasma pneumoniae NOT DETECTED NOT DETECTED Final  MRSA PCR Screening     Status: None   Collection Time: 03/20/16  1:15 AM  Result Value Ref Range Status   MRSA by PCR NEGATIVE NEGATIVE Final    Comment:        The GeneXpert MRSA Assay (FDA approved for NASAL specimens only), is one component of a comprehensive MRSA colonization surveillance program. It is not intended to diagnose MRSA infection nor to guide or monitor treatment for MRSA infections.   Culture, sputum-assessment     Status: None   Collection Time: 03/20/16  7:42 PM  Result Value Ref Range Status   Specimen Description EXPECTORATED SPUTUM  Final   Special Requests Normal  Final   Sputum evaluation   Final    THIS SPECIMEN IS ACCEPTABLE. RESPIRATORY CULTURE REPORT TO FOLLOW.   Report Status 03/21/2016  FINAL  Final  Culture, respiratory (NON-Expectorated)     Status: None   Collection Time: 03/20/16  7:42 PM  Result Value Ref Range Status   Specimen Description EXPECTORATED SPUTUM  Final   Special Requests NONE  Final   Gram Stain   Final  ABUNDANT WBC PRESENT,BOTH PMN AND MONONUCLEAR FEW SQUAMOUS EPITHELIAL CELLS PRESENT MODERATE GRAM POSITIVE COCCI IN PAIRS IN CLUSTERS FEW GRAM NEGATIVE RODS THIS SPECIMEN IS ACCEPTABLE FOR SPUTUM CULTURE Performed at [facility]    Culture   Final    NORMAL OROPHARYNGEAL FLORA Performed at [facility]    Report Status 03/23/2016 FINAL  Final      Radiology Studies: Nm Myocar Multi W/spect W/wall Motion / Ef  03/23/2016   There was no ST segment deviation noted during stress.  T wave inversion was noted during stress in the II, III, aVF, V4 and V5 leads.  Defect 1: There is a large defect of mild severity present in the basal inferior and mid inferior location.  Defect 2: There is a small defect of mild severity present in the apical lateral location.  Findings consistent with ischemia.  Nuclear stress EF: 41%.  The left ventricular ejection fraction is moderately decreased (30-44%).  This is a high risk study.      Scheduled Meds: . amLODipine  5 mg Oral Daily  . antiseptic oral rinse  7 mL Mouth Rinse BID  . aspirin EC  81 mg Oral Daily  . azithromycin  500 mg Oral q1800  . benzonatate  200 mg Oral TID  . cefTRIAXone (ROCEPHIN)  IV  2 g Intravenous Q24H  . fluticasone  2 spray Each Nare Daily  . guaiFENesin  600 mg Oral BID  . lisinopril  10 mg Oral Daily  . metoprolol tartrate  25 mg Oral BID  . pantoprazole  40 mg Oral Daily  . sodium chloride  1,000 mL Intravenous Once  . sodium chloride flush  3 mL Intravenous Q12H  . sodium chloride flush  3 mL Intravenous Q12H   Continuous Infusions: . [START ON 03/25/2016] sodium chloride     Followed by  . [START ON 03/25/2016] sodium chloride    . heparin 2,100  Units/hr (03/24/16 1207)     LOS: 4 days   Time Spent in minutes   30 minutes  Luis [name] D.O. on 03/24/2016 at 12:14 PM  Between 7am to 7pm - Pager - [phone]  After 7pm go to [email or web address] - password [email or web address]  And look for the night coverage person covering for me after hours  [facility] Office  [phone]

## 2016-03-24 NOTE — Progress Notes (Signed)
Patient Name: Luis [name] Date of Encounter: 03/24/2016  Hospital Problem List     Active Problems:   Hypertension   SIRS (systemic inflammatory response syndrome) (HCC)   Elevated troponin   A-fib (HCC)   CAP (community acquired pneumonia)   CHF (congestive heart failure) (HCC)   Cough   Dyspnea   Sepsis (HCC)   SOB (shortness of breath)   Tachycardia   Tachypnea   T wave inversion in EKG    Subjective   Feels well this morning. Did have some left sided chest discomfort when moving his chair around in the room this morning.   Inpatient Medications    . amLODipine  5 mg Oral Daily  . antiseptic oral rinse  7 mL Mouth Rinse BID  . aspirin EC  81 mg Oral Daily  . azithromycin  500 mg Oral q1800  . benzonatate  200 mg Oral TID  . cefTRIAXone (ROCEPHIN)  IV  2 g Intravenous Q24H  . fluticasone  2 spray Each Nare Daily  . guaiFENesin  600 mg Oral BID  . heparin subcutaneous  5,000 Units Subcutaneous Q8H  . ipratropium  0.5 mg Nebulization TID  . levalbuterol  1.25 mg Nebulization TID  . lisinopril  10 mg Oral Daily  . metoprolol tartrate  25 mg Oral BID  . pantoprazole  40 mg Oral Daily  . sodium chloride  1,000 mL Intravenous Once  . sodium chloride flush  3 mL Intravenous Q12H    Vital Signs    Filed Vitals:   03/23/16 1440 03/23/16 2046 03/24/16 0425 03/24/16 0916  BP: 123/68 139/86 134/85   Pulse: 92 76 71   Temp: 98 F (36.7 C) 97.9 F (36.6 C) 98.1 F (36.7 C)   TempSrc: Oral Oral Oral   Resp: Height:      Weight:   342 lb 14.4 oz (155.538 kg)   SpO2: 96% 98% 98% 96%    Intake/Output Summary (Last 24 hours) at 03/24/16 0921 Last data filed at 03/24/16 0900  Gross per 24 hour  Intake    480 ml  Output      0 ml  Net    480 ml   Filed Weights   03/22/16 0428 03/23/16 0620 03/24/16 0425  Weight: 347 lb 9.6 oz (157.67 kg) 345 lb 11.2 oz (156.808 kg) 342 lb 14.4 oz (155.538 kg)    Physical Exam    General: Pleasant obese AA  male, NAD. Neuro: Alert and oriented X 3. Moves all extremities spontaneously. Psych: Normal affect. HEENT:  Normal  Neck: Supple without bruits or JVD. Lungs:  Resp regular and unlabored, Slightly diminished in the lower lobes. . Heart: RRR no s3, s4, or murmurs. Abdomen: Soft, non-tender, non-distended, BS + x 4.  Extremities: No clubbing, cyanosis, trace edema. DP/PT/Radials 2+ and equal bilaterally.  Labs    CBC  Recent Labs  03/23/16 0319 03/24/16 0328  WBC 8.7 7.8  HGB 13.9 13.8  HCT 43.8 43.1  MCV 97.3 95.8  PLT 278 283   Basic Metabolic Panel  Recent Labs  03/23/16 0319 03/24/16 0328  NA 138 137  K 4.0 3.6  CL 102 104  CO2 27 24  GLUCOSE 113* 127*  BUN 13 11  CREATININE 1.22 1.04  CALCIUM 8.6* 8.7*    Telemetry    SR Rate- 60s-70s  ECG    No recent EKG  Radiology    Nm Myocar Multi W/spect W/wall Motion /  Ef  03/23/2016   There was no ST segment deviation noted during stress.  T wave inversion was noted during stress in the II, III, aVF, V4 and V5 leads.  Defect 1: There is a large defect of mild severity present in the basal inferior and mid inferior location.  Defect 2: There is a small defect of mild severity present in the apical lateral location.  Findings consistent with ischemia.  Nuclear stress EF: 41%.  The left ventricular ejection fraction is moderately decreased (30-44%).  This is a high risk study.     Assessment & Plan    1. Multifocal PNA: Pt presented with a three day h/o progressive dyspnea, cough, and diaphoresis. Chest CT showed multifocal PNA. Treatment per IM.  2. Elevated troponin: In setting of pneumonia. Pt has had sinus tachycardia with frequent PAC's and also chest tightness with a pleuritic component - worse with coughing. His troponin is mildly elevated with a flat trend of 0.08  0.08  0.10. Suspect demand ischemia in setting of #1. No prior h/o CAD. Risk factors include HTN, obesity, and h/o tobacco abuse.  Cont ASA and beta blocker. Echo EF 35-40% with diffuse hypokinesis. G2DD --Underwent a 2 day stress testing showed patient at high risk with large defect of mild severity in the basal inferior and mid inferior location and small defect in the apical lateral location. EF noted at 30-44%.   --Given this finding and his risk factors I feel he needs to undergo diagnostic LHC. Will discuss with Dr. Allyson Sabal concerning plan.  --Check lipid panel --Hgb A1c 7.8  3. Essential HTN: Controlled on amlodipine and metoprolol.   4. Likely OSA: outpatient sleep study.   Signed, Laverda Page NP-C Pager [phone]   Agree with note by Laverda Page NP-C  Pt admitted with SOB. Several months of DOE. PNA Rx by IM. Trop low. EF 35-40% ? HHD. MV high risk with inferior ischemia. Needs cath.  I have reviewed the risks, indications, and alternatives to cardiac catheterization, possible angioplasty, and stenting with the patient. Risks include but are not limited to bleeding, infection, vascular injury, stroke, myocardial infection, arrhythmia, kidney injury, radiation-related injury in the case of prolonged fluoroscopy use, emergency cardiac surgery, and death. The patient understands the risks of serious complication is 1-2 in 1000 with diagnostic cardiac cath and 1-2% or less with angioplasty/stenting.     Runell Gess, M.D., FACP, [facility], [facility] [address] [place], [place]  [address]  [phone] 03/24/2016 10:48 AM

## 2016-03-24 NOTE — H&P (View-Only) (Signed)
 Patient Name: Luis [name] Date of Encounter: 03/24/2016  Hospital Problem List     Active Problems:   Hypertension   SIRS (systemic inflammatory response syndrome) (HCC)   Elevated troponin   A-fib (HCC)   CAP (community acquired pneumonia)   CHF (congestive heart failure) (HCC)   Cough   Dyspnea   Sepsis (HCC)   SOB (shortness of breath)   Tachycardia   Tachypnea   T wave inversion in EKG    Subjective   Feels well this morning. Did have some left sided chest discomfort when moving his chair around in the room this morning.   Inpatient Medications    . amLODipine  5 mg Oral Daily  . antiseptic oral rinse  7 mL Mouth Rinse BID  . aspirin EC  81 mg Oral Daily  . azithromycin  500 mg Oral q1800  . benzonatate  200 mg Oral TID  . cefTRIAXone (ROCEPHIN)  IV  2 g Intravenous Q24H  . fluticasone  2 spray Each Nare Daily  . guaiFENesin  600 mg Oral BID  . heparin subcutaneous  5,000 Units Subcutaneous Q8H  . ipratropium  0.5 mg Nebulization TID  . levalbuterol  1.25 mg Nebulization TID  . lisinopril  10 mg Oral Daily  . metoprolol tartrate  25 mg Oral BID  . pantoprazole  40 mg Oral Daily  . sodium chloride  1,000 mL Intravenous Once  . sodium chloride flush  3 mL Intravenous Q12H    Vital Signs    Filed Vitals:   03/23/16 1440 03/23/16 2046 03/24/16 0425 03/24/16 0916  BP: 123/68 139/86 134/85   Pulse: 92 76 71   Temp: 98 F (36.7 C) 97.9 F (36.6 C) 98.1 F (36.7 C)   TempSrc: Oral Oral Oral   Resp: 20 18 18   Height:      Weight:   342 lb 14.4 oz (155.538 kg)   SpO2: 96% 98% 98% 96%    Intake/Output Summary (Last 24 hours) at 03/24/16 0921 Last data filed at 03/24/16 0900  Gross per 24 hour  Intake    480 ml  Output      0 ml  Net    480 ml   Filed Weights   03/22/16 0428 03/23/16 0620 03/24/16 0425  Weight: 347 lb 9.6 oz (157.67 kg) 345 lb 11.2 oz (156.808 kg) 342 lb 14.4 oz (155.538 kg)    Physical Exam    General: Pleasant obese AA  male, NAD. Neuro: Alert and oriented X 3. Moves all extremities spontaneously. Psych: Normal affect. HEENT:  Normal  Neck: Supple without bruits or JVD. Lungs:  Resp regular and unlabored, Slightly diminished in the lower lobes. . Heart: RRR no s3, s4, or murmurs. Abdomen: Soft, non-tender, non-distended, BS + x 4.  Extremities: No clubbing, cyanosis, trace edema. DP/PT/Radials 2+ and equal bilaterally.  Labs    CBC  Recent Labs  03/23/16 0319 03/24/16 0328  WBC 8.7 7.8  HGB 13.9 13.8  HCT 43.8 43.1  MCV 97.3 95.8  PLT 278 283   Basic Metabolic Panel  Recent Labs  03/23/16 0319 03/24/16 0328  NA 138 137  K 4.0 3.6  CL 102 104  CO2 27 24  GLUCOSE 113* 127*  BUN 13 11  CREATININE 1.22 1.04  CALCIUM 8.6* 8.7*    Telemetry    SR Rate- 60s-70s  ECG    No recent EKG  Radiology    Nm Myocar Multi W/spect W/wall Motion /   Ef  03/23/2016   There was no ST segment deviation noted during stress.  T wave inversion was noted during stress in the II, III, aVF, V4 and V5 leads.  Defect 1: There is a large defect of mild severity present in the basal inferior and mid inferior location.  Defect 2: There is a small defect of mild severity present in the apical lateral location.  Findings consistent with ischemia.  Nuclear stress EF: 41%.  The left ventricular ejection fraction is moderately decreased (30-44%).  This is a high risk study.     Assessment & Plan    1. Multifocal PNA: Pt presented with a three day h/o progressive dyspnea, cough, and diaphoresis. Chest CT showed multifocal PNA. Treatment per IM.  2. Elevated troponin: In setting of pneumonia. Pt has had sinus tachycardia with frequent PAC's and also chest tightness with a pleuritic component - worse with coughing. His troponin is mildly elevated with a flat trend of 0.08  0.08  0.10. Suspect demand ischemia in setting of #1. No prior h/o CAD. Risk factors include HTN, obesity, and h/o tobacco abuse.  Cont ASA and beta blocker. Echo EF 35-40% with diffuse hypokinesis. G2DD --Underwent a 2 day stress testing showed patient at high risk with large defect of mild severity in the basal inferior and mid inferior location and small defect in the apical lateral location. EF noted at 30-44%.   --Given this finding and his risk factors I feel he needs to undergo diagnostic LHC. Will discuss with Dr. Allyson Sabal concerning plan.  --Check lipid panel --Hgb A1c 7.8  3. Essential HTN: Controlled on amlodipine and metoprolol.   4. Likely OSA: outpatient sleep study.   Signed, Laverda Page NP-C Pager [phone]   Agree with note by Laverda Page NP-C  Pt admitted with SOB. Several months of DOE. PNA Rx by IM. Trop low. EF 35-40% ? HHD. MV high risk with inferior ischemia. Needs cath.  I have reviewed the risks, indications, and alternatives to cardiac catheterization, possible angioplasty, and stenting with the patient. Risks include but are not limited to bleeding, infection, vascular injury, stroke, myocardial infection, arrhythmia, kidney injury, radiation-related injury in the case of prolonged fluoroscopy use, emergency cardiac surgery, and death. The patient understands the risks of serious complication is 1-2 in 1000 with diagnostic cardiac cath and 1-2% or less with angioplasty/stenting.     Runell Gess, M.D., FACP, [facility], [facility] [address] [place], [place]  [address]  [phone] 03/24/2016 10:48 AM

## 2016-03-24 NOTE — Interval H&P Note (Signed)
History and Physical Interval Note:  03/24/2016 5:47 PM  [name]  has presented today for surgery, with the diagnosis of Unstable Angina with Abnormal Nuclear ST.   The various methods of treatment have been discussed with the patient and family. After consideration of risks, benefits and other options for treatment, the patient has consented to  Procedure(s): Left Heart Cath and Coronary Angiography (N/A) with Possible Percutaneous Coronary Intevention as a surgical intervention .  The patient's history has been reviewed, patient examined, no change in status, stable for surgery.  I have reviewed the patient's chart and labs.  Questions were answered to the patient's satisfaction.    Cath Lab Visit (complete for each Cath Lab visit)  Clinical Evaluation Leading to the Procedure:   ACS: Yes.    Non-ACS:    Anginal Classification: CCS III  Anti-ischemic medical therapy: Maximal Therapy (2 or more classes of medications)  Non-Invasive Test Results: High-risk stress test findings: cardiac mortality >3%/year    There was no ST segment deviation noted during stress.  T wave inversion was noted during stress in the II, III, aVF, V4 and V5 leads.  Defect 1: There is a large defect of mild severity present in the basal inferior and mid inferior location.  Defect 2: There is a small defect of mild severity present in the apical lateral location.  Findings consistent with ischemia.  Nuclear stress EF: 41%.  The left ventricular ejection fraction is moderately decreased (30-44%).  This is a high risk study.   Prior CABG: No previous CABG  TIMI SCORE  Patient Information:  TIMI Score is 4  UA/NSTEMI and intermediate-risk features (e.g., TIMI score 3?4) for short-term risk of death or nonfatal MI  Revascularization of the presumed culprit artery   A (8)  Indication: 10; Score: 8   Bryan Lemma, MD

## 2016-03-24 NOTE — Progress Notes (Signed)
Nutrition Consult/Brief Note  RD consulted for assessment of nutrition requirements/status.  Wt Readings from Last 15 Encounters:  03/24/16 342 lb 14.4 oz (155.538 kg)  10/18/15 347 lb (157.398 kg)    Body mass index is 42.86 kg/(m^2). Patient meets criteria for Obesity Class III based on current BMI.   Current diet order is Heart Healthy, patient is consuming approximately 100% of meals at this time. Labs and medications reviewed.   Weight loss education handouts provided 5/19.  No further nutrition interventions warranted at this time.   Maureen Chatters, RD, LDN Pager #: [phone] After-Hours Pager #: [phone]

## 2016-03-24 NOTE — Progress Notes (Signed)
ANTICOAGULATION CONSULT NOTE  Pharmacy Consult for heparin Indication: chest pain/ACS  Allergies  Allergen Reactions  . Morphine And Related Itching    Patient Measurements: Height: 6\' 3"  (190.5 cm) Weight: (!) 342 lb 14.4 oz (155.538 kg) IBW/kg (Calculated) : 84.5 Heparin Dosing Weight: 121 kg  Vital Signs: Temp: 98.1 F (36.7 C) (05/22 0425) Temp Source: Oral (05/22 0425) BP: 134/85 mmHg (05/22 0425) Pulse Rate: 71 (05/22 0425)  Labs:  Recent Labs  03/22/16 0242 03/23/16 0319 03/24/16 0328  HGB 13.5 13.9 13.8  HCT 42.6 43.8 43.1  PLT 256 278 283  CREATININE 1.18 1.22 1.04    Estimated Creatinine Clearance: 138.7 mL/min (by C-G formula based on Cr of 1.04).   Medications:  Infusions:    Assessment: 50 yo male admitted with cough and SOB >> being treated for pneumonia. Incidentally found small troponin elevation. This was initially thought to be due to demand ischemia. Pt was placed on a heparin gtt from 5/17-5/19.  Pt then had a 2 day stress test which showed him to be at high risk. Heparin gtt will now be resumed and the patient will undergo a cardiac catheterization. CBC is stable. Pt previously required upwards of heparin 2100-2400 units/hr. He received heparin 5000 units Barry this AM, will hold initial bolus.    Goal of Therapy:  Heparin level 0.3-0.7 units/ml Monitor platelets by anticoagulation protocol: Yes    Plan:  -Heparin infusion at 2100 units/hr, hold initial bolus -Daily HL, CBC -First level this afternoon  -F/u after cardiac cath   Baldemar Friday 03/24/2016,11:18 AM

## 2016-03-25 ENCOUNTER — Encounter (HOSPITAL_COMMUNITY): Payer: Self-pay | Admitting: Cardiology

## 2016-03-25 ENCOUNTER — Inpatient Hospital Stay (HOSPITAL_COMMUNITY): Payer: BLUE CROSS/BLUE SHIELD

## 2016-03-25 DIAGNOSIS — R931 Abnormal findings on diagnostic imaging of heart and coronary circulation: Secondary | ICD-10-CM

## 2016-03-25 DIAGNOSIS — I5032 Chronic diastolic (congestive) heart failure: Secondary | ICD-10-CM

## 2016-03-25 DIAGNOSIS — I4891 Unspecified atrial fibrillation: Secondary | ICD-10-CM | POA: Insufficient documentation

## 2016-03-25 DIAGNOSIS — I5042 Chronic combined systolic (congestive) and diastolic (congestive) heart failure: Secondary | ICD-10-CM

## 2016-03-25 LAB — CBC
HEMATOCRIT: 46.7 % (ref 39.0–52.0)
HEMOGLOBIN: 15 g/dL (ref 13.0–17.0)
MCH: 30.5 pg (ref 26.0–34.0)
MCHC: 32.1 g/dL (ref 30.0–36.0)
MCV: 94.9 fL (ref 78.0–100.0)
Platelets: 300 10*3/uL (ref 150–400)
RBC: 4.92 MIL/uL (ref 4.22–5.81)
RDW: 13.9 % (ref 11.5–15.5)
WBC: 8.3 10*3/uL (ref 4.0–10.5)

## 2016-03-25 MED ORDER — HYDRALAZINE HCL 25 MG PO TABS
25.0000 mg | ORAL_TABLET | Freq: Three times a day (TID) | ORAL | Status: DC
  Administered 2016-03-25 – 2016-03-26 (×3): 25 mg via ORAL
  Filled 2016-03-25 (×3): qty 1

## 2016-03-25 MED ORDER — LOSARTAN POTASSIUM 50 MG PO TABS
100.0000 mg | ORAL_TABLET | Freq: Every day | ORAL | Status: DC
  Administered 2016-03-25 – 2016-03-26 (×2): 100 mg via ORAL
  Filled 2016-03-25: qty 2

## 2016-03-25 NOTE — Progress Notes (Signed)
Patient Profile: 50 y/o male w/o prior cardiac hx who presented to [facility] on 5/17 with a three day h/o progressive dyspnea and cough and was found to be tachycardic with a mild troponin elevation. High risk stress test>> angiographic normal coronary arteries>> false positive nuclear stress test.   Subjective: Main complaint is blurred vision out of both eyes, that started shortly after his LHC. Vision was normal prior to procedure. It has not improved. He denied HA, confusion, weakness, numbness and no changes in speech.   Objective: Vital signs in last 24 hours: Temp:  [97.5 F (36.4 C)-98.4 F (36.9 C)] 97.5 F (36.4 C) (05/23 0455) Pulse Rate:  [0-98] 88 (05/23 0845) Resp:  [0-40] 40 (05/23 0455) BP: (104-200)/(57-128) 150/108 mmHg (05/23 0845) SpO2:  [0 %-100 %] 94 % (05/23 0455) Weight:  [336 lb 4.8 oz (152.545 kg)] 336 lb 4.8 oz (152.545 kg) (05/23 0455) Last BM Date: 03/22/16  Intake/Output from previous day: 05/22 0701 - 05/23 0700 In: 480 [P.O.:480] Out: 3175 [Urine:3175] Intake/Output this shift: Total I/O In: 240 [P.O.:240] Out: -   Medications Current Facility-Administered Medications  Medication Dose Route Frequency Provider Last Rate Last Dose  . 0.9 %  sodium chloride infusion  250 mL Intravenous PRN Marykay Lex, MD      . 0.9 %  sodium chloride infusion  250 mL Intravenous PRN Marykay Lex, MD      . acetaminophen (TYLENOL) tablet 650 mg  650 mg Oral Q6H PRN Therisa Doyne, MD       Or  . acetaminophen (TYLENOL) suppository 650 mg  650 mg Rectal Q6H PRN Therisa Doyne, MD      . amLODipine (NORVASC) tablet 5 mg  5 mg Oral Daily Therisa Doyne, MD   5 mg at 03/25/16 0845  . antiseptic oral rinse (CPC / CETYLPYRIDINIUM CHLORIDE 0.05%) solution 7 mL  7 mL Mouth Rinse BID Maryann Mikhail, DO   7 mL at 03/25/16 1000  . aspirin EC tablet 81 mg  81 mg Oral Daily Therisa Doyne, MD   81 mg at 03/25/16 0845  . azithromycin (ZITHROMAX) tablet  500 mg  500 mg Oral q1800 Maryann Mikhail, DO   500 mg at 03/24/16 1636  . benzonatate (TESSALON) capsule 200 mg  200 mg Oral TID Maryann Mikhail, DO   200 mg at 03/25/16 0845  . cefTRIAXone (ROCEPHIN) 2 g in dextrose 5 % 50 mL IVPB  2 g Intravenous Q24H Lauren D Bajbus, RPH   2 g at 03/24/16 2129  . chlorpheniramine-HYDROcodone (TUSSIONEX) 10-8 MG/5ML suspension 5 mL  5 mL Oral Q12H PRN Maryann Mikhail, DO   5 mL at 03/24/16 0146  . enoxaparin (LOVENOX) injection 40 mg  40 mg Subcutaneous Q24H Marykay Lex, MD   40 mg at 03/25/16 0844  . fluticasone (FLONASE) 50 MCG/ACT nasal spray 2 spray  2 spray Each Nare Daily Maryann Mikhail, DO   2 spray at 03/25/16 [id number]  . guaiFENesin (MUCINEX) 12 hr tablet 600 mg  600 mg Oral BID Therisa Doyne, MD   600 mg at 03/25/16 0845  . hydrALAZINE (APRESOLINE) injection 10 mg  10 mg Intravenous Q6H PRN Maryann Mikhail, DO   10 mg at 03/23/16 0647  . ipratropium (ATROVENT) nebulizer solution 0.5 mg  0.5 mg Nebulization Q4H PRN Maryann Mikhail, DO      . levalbuterol (XOPENEX) nebulizer solution 1.25 mg  1.25 mg Nebulization Q4H PRN Maryann Mikhail, DO      .  lisinopril (PRINIVIL,ZESTRIL) tablet 10 mg  10 mg Oral Daily Maryann Mikhail, DO   10 mg at 03/25/16 0845  . metoprolol tartrate (LOPRESSOR) tablet 25 mg  25 mg Oral BID Ok Anis, NP   25 mg at 03/25/16 0845  . ondansetron (ZOFRAN) tablet 4 mg  4 mg Oral Q6H PRN Therisa Doyne, MD       Or  . ondansetron (ZOFRAN) injection 4 mg  4 mg Intravenous Q6H PRN Therisa Doyne, MD      . pantoprazole (PROTONIX) EC tablet 40 mg  40 mg Oral Daily Maryann Mikhail, DO   40 mg at 03/25/16 0845  . sodium chloride 0.9 % bolus 1,000 mL  1,000 mL Intravenous Once Therisa Doyne, MD      . sodium chloride flush (NS) 0.9 % injection 3 mL  3 mL Intravenous Q12H Therisa Doyne, MD   3 mL at 03/24/16 1000  . sodium chloride flush (NS) 0.9 % injection 3 mL  3 mL Intravenous Q12H Marykay Lex, MD   0  mL at 03/24/16 2122  . sodium chloride flush (NS) 0.9 % injection 3 mL  3 mL Intravenous PRN Marykay Lex, MD      . sodium chloride flush (NS) 0.9 % injection 3 mL  3 mL Intravenous Q12H Marykay Lex, MD   3 mL at 03/24/16 2121  . sodium chloride flush (NS) 0.9 % injection 3 mL  3 mL Intravenous PRN Marykay Lex, MD        PE: General appearance: alert, cooperative, no distress and moderately obese  Neck: no carotid bruit and no JVD Lungs: clear to auscultation bilaterally Heart: regular rate and rhythm, S1, S2 normal, no murmur, click, rub or gallop Extremities: no LEE Pulses: 2+ and symmetric Skin: warm and dry Neurologic: Grossly normal  Lab Results:   Recent Labs  03/23/16 0319 03/24/16 0328 03/25/16 0233  WBC 8.7 7.8 8.3  HGB 13.9 13.8 15.0  HCT 43.8 43.1 46.7  PLT 278 283 300   BMET  Recent Labs  03/23/16 0319 03/24/16 0328  NA 138 137  K 4.0 3.6  CL 102 104  CO2 27 24  GLUCOSE 113* 127*  BUN 13 11  CREATININE 1.22 1.04  CALCIUM 8.6* 8.7*   PT/INR No results for input(s): LABPROT, INR in the last 72 hours. Cholesterol  Recent Labs  03/24/16 1108  CHOL 164   Cardiac Panel (last 3 results) No results for input(s): CKTOTAL, CKMB, TROPONINI, RELINDX in the last 72 hours.  Studies/Results: Procedures    Left Heart Cath and Coronary Angiography    Conclusion    1. Mid LAD lesion, 35% stenosed. 2. There is moderate left ventricular systolic dysfunction, global hypokinesis - with severely elevated LVEDP 3. 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 Extremely tortuous coronary arteries suggestive of severe hypertension.   Plan:  Return to nursing unit for ongoing care with TR band removal  Will dose IV Lasix upon arrival to floor.  Continue aggressive risk factor modification with blood pressure control  and diuresis.     Assessment/Plan  Active Problems:   Essential hypertension   SIRS (systemic inflammatory response syndrome) (HCC)   Elevated troponin   CAP (community acquired pneumonia)   Cough   Dyspnea   Sepsis (HCC)   SOB (shortness of breath)   Tachycardia   Tachypnea   T wave inversion in  EKG   Diastolic congestive heart failure (HCC)   Abnormal nuclear stress test   1. Abnormal NST: false positive. LHC 03/24/16 revealed angiographic normal coronary arteries. Radial cath site is stable.   2. HTN: poorly controlled. Most recent BP is 150/108. We will d/c lisinopril and change to losartan 100 mg and add hydralazine 25 mg BID. Continue amlodipine and metoprolol.   3. Blurred Vision: bilateral. Symptom onset occurred following radial cath. Neuro exam benign. No other deficits.  BP elevated, which may be contributing. Discussed with Dr. Allyson Sabal. We will adjust antihypertensives and will call neuro consult.    LOS: 5 days    Brittainy M. Delmer Islam 03/25/2016 11:34 AM  Agree with note written by Boyce Medici  [facility]  Essentially clean Cors. NISCM prob secondary to HHD. Needs better control of BP. Will adjust. Blurred vision bilat after cath. MRI ordered and Neuro to see.  Nanetta Batty 03/25/2016 1:07 PM

## 2016-03-25 NOTE — Progress Notes (Signed)
PROGRESS NOTE    Luis [name]  OZH:[id number] DOB: 1965/11/24 DOA: 03/19/2016 PCP: [name], MD    Brief Narrative:  50 year old male history of hypertension who presented with 1 week of cough and shortness of breath. Patient was presumably treated for pneumonia 2-3 months ago with doxycycline. Admitted for pneumonia and atrial fibrillation. Cardiology consulted.  Assessment & Plan   Blurred vision -Will obtain MRI brain -Started shortly after cardiac cath -If positive, will formally consulted neurology   Elevated troponin -Likely secondary to the above -Troponin peak at 0.1 -Likely demand ischemia -Echocardiogram as above -heparin drip discontinued, place on DVT prophylactic dose -Myoview: EF 41%, high risk study, large Defect of mild severity basal inferior and mid inferior location. Small defect severity present a lateral location. -Cardiac catheterization- Mild LAD lesion 35% stenosed. Moderate LV systolic dysfucntion, global hypokinesis, nonishemic/hypertensive CM, Combined heart failure -Pending further recommendations from cardiology  New Combined systolic/diastolic HF -Echocardiogram as above -will discontinue IVF -BNP 97 -Patient does not appear to be volume overloaded -Started low dose lisinorpil -Monitor intake/output, daily weights -Spoke with cardiology on 5/20, recommended myoview- results as above  Sepsis secondary to community-acquired pneumonia/Rhinovirus -Upon admission, patient was tachycardic, tachypneic, with leukocytosis -CTA chest no evidence of pulmonary embolism, multifocal pneumonia versus bronchopneumonia -Leukocytosis resolved -Urine strep pneumonia Ag negative -Continue azithromycin and ceftriaxone -Sputum culture show normal flora -Blood cultures show no growth -Respiratory viral panel + rhinovirus -Continue antitussives, scheduled and as needed  Sinus tachycardia -Resolved -Thought to be A. fib however rhythm does not show  atrial for, shows sinus rhythm with [facility] -Cardiology consulted and appreciated -Echocardiogram EF 35-40%, grade 2 diastolic dysfunction; moderate global reduction in LV function, moderate LVH and LVE, mild LAE -TSH 0.777   Essential hypertension -Supposedly patient ran out of his medications -Continue Norvasc and metoprolol, lisinopril  Morbid obesity -Upon discharge patient will need to discuss with his primary care physician last modifications including diet and exercise -BMI 43.7  Possible sleep apnea -Will need outpatient sleep study  DVT Prophylaxis  heparin  Code Status: Full  Family Communication: None at bedside  Disposition Plan: Admitted. Pending cardiac catherization   Consultants Cardiology   Procedures  Echocardiogram Myoview Cardiac catheterization   Antibiotics   Anti-infectives    Start     Dose/Rate Route Frequency Ordered Stop   03/23/16 1800  azithromycin (ZITHROMAX) tablet 500 mg     500 mg Oral Daily-1800 03/23/16 1015 03/26/16 1759   03/20/16 2200  cefTRIAXone (ROCEPHIN) 1 g in dextrose 5 % 50 mL IVPB  Status:  Discontinued     1 g 100 mL/hr over 30 Minutes Intravenous Every 24 hours 03/19/16 2219 03/19/16 2239   03/20/16 2200  cefTRIAXone (ROCEPHIN) 2 g in dextrose 5 % 50 mL IVPB     2 g 100 mL/hr over 30 Minutes Intravenous Every 24 hours 03/19/16 2239 03/26/16 2159   03/20/16 2000  azithromycin (ZITHROMAX) 500 mg in dextrose 5 % 250 mL IVPB  Status:  Discontinued     500 mg 250 mL/hr over 60 Minutes Intravenous Every 24 hours 03/19/16 2219 03/23/16 1014   03/19/16 1745  cefTRIAXone (ROCEPHIN) 1 g in dextrose 5 % 50 mL IVPB     1 g 100 mL/hr over 30 Minutes Intravenous  Once 03/19/16 1730 03/19/16 1912   03/19/16 1745  azithromycin (ZITHROMAX) 500 mg in dextrose 5 % 250 mL IVPB     500 mg 250 mL/hr over 60 Minutes Intravenous  Once  03/19/16 1730 03/19/16 1943      Subjective:   Luis [name] seen and examined today.   Feels breathing  and cough have improved. Complains of blurry vision.  Denies headache or dizziness.  Denies chest pain or palpitations, abdominal pain, nausea or vomiting, diarrhea or constipation.  Objective:   Filed Vitals:   03/24/16 2200 03/24/16 2216 03/25/16 0455 03/25/16 0845  BP: 116/57 104/62 138/97 150/108  Pulse: 86 60 57 88  Temp:   97.5 F (36.4 C)   TempSrc:   Oral   Resp: 20 20 40   Height:      Weight:   152.545 kg (336 lb 4.8 oz)   SpO2: 100% 100% 94%     Intake/Output Summary (Last 24 hours) at 03/25/16 1201 Last data filed at 03/25/16 0900  Gross per 24 hour  Intake    480 ml  Output   3175 ml  Net  -2695 ml   Filed Weights   03/23/16 0620 03/24/16 0425 03/25/16 0455  Weight: 156.808 kg (345 lb 11.2 oz) 155.538 kg (342 lb 14.4 oz) 152.545 kg (336 lb 4.8 oz)    Exam  General: Well developed, morbidly obese, no distress  HEENT: NCAT,mucous membranes moist.   Cardiovascular: S1 S2 auscultated, RRR, no murmur  Respiratory: Diminished but clear.   Abdomen: Soft, obese, nontender, nondistended, + bowel sounds  Extremities: warm dry without cyanosis clubbing or edema  Neuro: AAOx3, nonfocal  Psych: Normal affect and demeanor, pleasant   Data Reviewed: I have personally reviewed following labs and imaging studies  CBC:  Recent Labs Lab 03/21/16 0504 03/22/16 0242 03/23/16 0319 03/24/16 0328 03/25/16 0233  WBC 11.0* 8.9 8.7 7.8 8.3  HGB 14.1 13.5 13.9 13.8 15.0  HCT 43.9 42.6 43.8 43.1 46.7  MCV 97.1 97.9 97.3 95.8 94.9  PLT 242 256 278 283 300   Basic Metabolic Panel:  Recent Labs Lab 03/20/16 0725 03/21/16 0504 03/22/16 0242 03/23/16 0319 03/24/16 0328  NA 140 141 139 138 137  K 3.5 4.1 4.2 4.0 3.6  CL 102 105 104 102 104  CO2 24 25 25 27 24   GLUCOSE 128* 145* 108* 113* 127*  BUN 7 11 13 13 11   CREATININE 1.12 1.16 1.18 1.22 1.04  CALCIUM 8.4* 8.5* 8.5* 8.6* 8.7*  MG 2.0  --   --   --   --   PHOS 3.8  --   --   --   --     GFR: Estimated Creatinine Clearance: 137.2 mL/min (by C-G formula based on Cr of 1.04). Liver Function Tests:  Recent Labs Lab 03/19/16 1733 03/20/16 0725  AST 23 18  ALT 21 19  ALKPHOS 110 108  BILITOT 0.9 0.8  PROT 7.4 7.3  ALBUMIN 3.3* 3.3*   No results for input(s): LIPASE, AMYLASE in the last 168 hours. No results for input(s): AMMONIA in the last 168 hours. Coagulation Profile:  Recent Labs Lab 03/19/16 2010  INR 1.15   Cardiac Enzymes:  Recent Labs Lab 03/19/16 2010 03/20/16 0046 03/20/16 0725  TROPONINI 0.08* 0.08* 0.10*   BNP (last 3 results) No results for input(s): PROBNP in the last 8760 hours. HbA1C: No results for input(s): HGBA1C in the last 72 hours. CBG: No results for input(s): GLUCAP in the last 168 hours. Lipid Profile:  Recent Labs  03/24/16 1108  CHOL 164  HDL 23*  LDLCALC 102*  TRIG 193*  CHOLHDL 7.1   Thyroid Function Tests: No results for input(s): TSH,  T4TOTAL, FREET4, T3FREE, THYROIDAB in the last 72 hours. Anemia Panel: No results for input(s): VITAMINB12, FOLATE, FERRITIN, TIBC, IRON, RETICCTPCT in the last 72 hours. Urine analysis:    Component Value Date/Time   COLORURINE YELLOW 03/19/2016 2121   APPEARANCEUR CLEAR 03/19/2016 2121   LABSPEC 1.015 03/19/2016 2121   PHURINE 7.5 03/19/2016 2121   GLUCOSEU NEGATIVE 03/19/2016 2121   HGBUR NEGATIVE 03/19/2016 2121   BILIRUBINUR NEGATIVE 03/19/2016 2121   KETONESUR NEGATIVE 03/19/2016 2121   PROTEINUR NEGATIVE 03/19/2016 2121   NITRITE NEGATIVE 03/19/2016 2121   LEUKOCYTESUR NEGATIVE 03/19/2016 2121   Sepsis Labs: @LABRCNTIP (procalcitonin:4,lacticidven:4)  ) Recent Results (from the past 240 hour(s))  Blood Culture (routine x 2)     Status: None   Collection Time: 03/19/16  5:33 PM  Result Value Ref Range Status   Specimen Description BLOOD RIGHT FOREARM  Final   Special Requests BOTTLES DRAWN AEROBIC AND ANAEROBIC 5CC  Final   Culture NO GROWTH 5 DAYS  Final    Report Status 03/24/2016 FINAL  Final  Blood Culture (routine x 2)     Status: None   Collection Time: 03/19/16  6:40 PM  Result Value Ref Range Status   Specimen Description BLOOD LEFT ANTECUBITAL  Final   Special Requests BOTTLES DRAWN AEROBIC AND ANAEROBIC 5CC  Final   Culture NO GROWTH 5 DAYS  Final   Report Status 03/24/2016 FINAL  Final  Respiratory Panel by PCR     Status: Abnormal   Collection Time: 03/19/16 11:59 PM  Result Value Ref Range Status   Adenovirus NOT DETECTED NOT DETECTED Final   Coronavirus 229E NOT DETECTED NOT DETECTED Final   Coronavirus HKU1 NOT DETECTED NOT DETECTED Final   Coronavirus NL63 NOT DETECTED NOT DETECTED Final   Coronavirus OC43 NOT DETECTED NOT DETECTED Final   Metapneumovirus NOT DETECTED NOT DETECTED Final   Rhinovirus / Enterovirus DETECTED (A) NOT DETECTED Final   Influenza A NOT DETECTED NOT DETECTED Final   Influenza A H1 NOT DETECTED NOT DETECTED Final   Influenza A H1 2009 NOT DETECTED NOT DETECTED Final   Influenza A H3 NOT DETECTED NOT DETECTED Final   Influenza B NOT DETECTED NOT DETECTED Final   Parainfluenza Virus 1 NOT DETECTED NOT DETECTED Final   Parainfluenza Virus 2 NOT DETECTED NOT DETECTED Final   Parainfluenza Virus 3 NOT DETECTED NOT DETECTED Final   Parainfluenza Virus 4 NOT DETECTED NOT DETECTED Final   Respiratory Syncytial Virus NOT DETECTED NOT DETECTED Final   Bordetella pertussis NOT DETECTED NOT DETECTED Final   Chlamydophila pneumoniae NOT DETECTED NOT DETECTED Final   Mycoplasma pneumoniae NOT DETECTED NOT DETECTED Final  MRSA PCR Screening     Status: None   Collection Time: 03/20/16  1:15 AM  Result Value Ref Range Status   MRSA by PCR NEGATIVE NEGATIVE Final    Comment:        The GeneXpert MRSA Assay (FDA approved for NASAL specimens only), is one component of a comprehensive MRSA colonization surveillance program. It is not intended to diagnose MRSA infection nor to guide or monitor treatment  for MRSA infections.   Culture, sputum-assessment     Status: None   Collection Time: 03/20/16  7:42 PM  Result Value Ref Range Status   Specimen Description EXPECTORATED SPUTUM  Final   Special Requests Normal  Final   Sputum evaluation   Final    THIS SPECIMEN IS ACCEPTABLE. RESPIRATORY CULTURE REPORT TO FOLLOW.   Report Status 03/21/2016 FINAL  Final  Culture, respiratory (NON-Expectorated)     Status: None   Collection Time: 03/20/16  7:42 PM  Result Value Ref Range Status   Specimen Description EXPECTORATED SPUTUM  Final   Special Requests NONE  Final   Gram Stain   Final    ABUNDANT WBC PRESENT,BOTH PMN AND MONONUCLEAR FEW SQUAMOUS EPITHELIAL CELLS PRESENT MODERATE GRAM POSITIVE COCCI IN PAIRS IN CLUSTERS FEW GRAM NEGATIVE RODS THIS SPECIMEN IS ACCEPTABLE FOR SPUTUM CULTURE Performed at [facility]    Culture   Final    NORMAL OROPHARYNGEAL FLORA Performed at [facility]    Report Status 03/23/2016 FINAL  Final      Radiology Studies: Nm Myocar Multi W/spect W/wall Motion / Ef  03/23/2016   There was no ST segment deviation noted during stress.  T wave inversion was noted during stress in the II, III, aVF, V4 and V5 leads.  Defect 1: There is a large defect of mild severity present in the basal inferior and mid inferior location.  Defect 2: There is a small defect of mild severity present in the apical lateral location.  Findings consistent with ischemia.  Nuclear stress EF: 41%.  The left ventricular ejection fraction is moderately decreased (30-44%).  This is a high risk study.      Scheduled Meds: . amLODipine  5 mg Oral Daily  . antiseptic oral rinse  7 mL Mouth Rinse BID  . aspirin EC  81 mg Oral Daily  . azithromycin  500 mg Oral q1800  . benzonatate  200 mg Oral TID  . cefTRIAXone (ROCEPHIN)  IV  2 g Intravenous Q24H  . enoxaparin (LOVENOX) injection  40 mg Subcutaneous Q24H  . fluticasone  2 spray Each Nare Daily  . guaiFENesin   600 mg Oral BID  . hydrALAZINE  25 mg Oral Q8H  . losartan  100 mg Oral Daily  . metoprolol tartrate  25 mg Oral BID  . pantoprazole  40 mg Oral Daily  . sodium chloride  1,000 mL Intravenous Once  . sodium chloride flush  3 mL Intravenous Q12H  . sodium chloride flush  3 mL Intravenous Q12H  . sodium chloride flush  3 mL Intravenous Q12H   Continuous Infusions:     LOS: 5 days   Time Spent in minutes   30 minutes  Tallan Sandoz D.O. on 03/25/2016 at 12:01 PM  Between 7am to 7pm - Pager - [phone]  After 7pm go to [email or web address] - password [email or web address]  And look for the night coverage person covering for me after hours  [facility] Office  [phone]

## 2016-03-25 NOTE — Progress Notes (Signed)
Patient c/o of blurred vision even w/ glasses on. Neuro assessment normal otherwise. Patient able to read with one eye only not blurred. Check both right and left eyes Just blurred for distance. Donnamarie Poag N.P. Page and made aware. Cont. To monitor patient. Charge R.N. Reden aware.

## 2016-03-25 NOTE — Progress Notes (Signed)
Patient on tele and Central monitoring calling not sure of rhythm. S>R. Pac then Wondering at pacer vs slow At. Fib. Tried to do EKG to deter. Rhythm. 12 Lead shows S.R.  S.A. Cont. To monitor

## 2016-03-26 DIAGNOSIS — H538 Other visual disturbances: Secondary | ICD-10-CM | POA: Insufficient documentation

## 2016-03-26 DIAGNOSIS — R0600 Dyspnea, unspecified: Secondary | ICD-10-CM

## 2016-03-26 LAB — BASIC METABOLIC PANEL
ANION GAP: 12 (ref 5–15)
BUN: 18 mg/dL (ref 6–20)
CHLORIDE: 103 mmol/L (ref 101–111)
CO2: 22 mmol/L (ref 22–32)
Calcium: 8.9 mg/dL (ref 8.9–10.3)
Creatinine, Ser: 1.2 mg/dL (ref 0.61–1.24)
GFR calc non Af Amer: >60 mL/min (ref >60)
Glucose, Bld: 108 mg/dL — ABNORMAL HIGH (ref 65–99)
Potassium: 3.8 mmol/L (ref 3.5–5.1)
Sodium: 137 mmol/L (ref 135–145)

## 2016-03-26 LAB — CBC
HEMATOCRIT: 44.4 % (ref 39.0–52.0)
HEMOGLOBIN: 14.4 g/dL (ref 13.0–17.0)
MCH: 31 pg (ref 26.0–34.0)
MCHC: 32.4 g/dL (ref 30.0–36.0)
MCV: 95.5 fL (ref 78.0–100.0)
Platelets: 303 10*3/uL (ref 150–400)
RBC: 4.65 MIL/uL (ref 4.22–5.81)
RDW: 14.2 % (ref 11.5–15.5)
WBC: 8.8 10*3/uL (ref 4.0–10.5)

## 2016-03-26 MED ORDER — AMLODIPINE BESYLATE 5 MG PO TABS: 5.0000 mg | ORAL_TABLET | Freq: Every day | ORAL | Status: DC

## 2016-03-26 MED ORDER — ASPIRIN 81 MG PO TBEC: 81.0000 mg | DELAYED_RELEASE_TABLET | Freq: Every day | ORAL | Status: AC

## 2016-03-26 MED ORDER — LOSARTAN POTASSIUM 100 MG PO TABS: 100.0000 mg | ORAL_TABLET | Freq: Every day | ORAL | Status: DC

## 2016-03-26 MED ORDER — HYDROCOD POLST-CPM POLST ER 10-8 MG/5ML PO SUER: 5.0000 mL | Freq: Two times a day (BID) | ORAL | Status: DC | PRN

## 2016-03-26 MED ORDER — GUAIFENESIN ER 600 MG PO TB12: 600.0000 mg | ORAL_TABLET | Freq: Two times a day (BID) | ORAL | Status: DC

## 2016-03-26 MED ORDER — METOPROLOL TARTRATE 25 MG PO TABS: 25.0000 mg | ORAL_TABLET | Freq: Two times a day (BID) | ORAL | Status: DC

## 2016-03-26 MED ORDER — FLUTICASONE PROPIONATE 50 MCG/ACT NA SUSP: 2.0000 | Freq: Every day | NASAL | Status: DC

## 2016-03-26 MED ORDER — LEVALBUTEROL HCL 1.25 MG/0.5ML IN NEBU: 1.2500 mg | INHALATION_SOLUTION | RESPIRATORY_TRACT | Status: DC | PRN

## 2016-03-26 MED ORDER — HYDRALAZINE HCL 25 MG PO TABS: 25.0000 mg | ORAL_TABLET | Freq: Three times a day (TID) | ORAL | Status: DC

## 2016-03-26 NOTE — Progress Notes (Signed)
   03/26/16 0659  Vitals  BP 132/77 mmHg  BP Location Right Arm  BP Method Automatic  Patient Position (if appropriate) Sitting  Pulse Rate 87  Pulse Rate Source Dinamap   Informed Dr. Izola Price MD of this event via text page.  At [phone] pt experienced 3.4 second pause.  Patient denies pain.  Patient was sleeping at the time of incident.  Vitals stable.  Waiting for further orders.  Will continue to monitor.  Barrie Lyme RN 03/26/2016  7:47 AM

## 2016-03-26 NOTE — Discharge Summary (Signed)
Physician Discharge Summary  Luis [name] ZOX:[id number] DOB: Dec 24, 1965 DOA: 03/19/2016  PCP: [name], MD  Admit date: 03/19/2016 Discharge date: 03/26/2016  Recommendations for Outpatient Follow-up:  1. Pt will need to follow up with PCP in 1-2 weeks post discharge 2. Please obtain BMP to evaluate electrolytes and kidney function 3. Pt has an appointment scheduled with Neurologist outlined below, he was made aware he needs to see neurologist for evaluation of blurry vision 4. Pt also to follow up with cardiologist, he was made aware 5. Prescriptions given for all medications below   Discharge Diagnoses:  Active Problems:   Essential hypertension  Discharge Condition: Stable  Diet recommendation: Heart healthy diet discussed in details    Brief Narrative:  50 year old male history of hypertension who presented with 1 week of cough and shortness of breath. Patient was presumably treated for pneumonia 2-3 months ago with doxycycline. Admitted for pneumonia and atrial fibrillation. Cardiology consulted.  Assessment & Plan   Blurred vision - MRI with no evidence of stroke - Started shortly after cardiac cath and slightly better but still present -If positive, will formally consulted neurology - d/w neurology on call who thought this was related to high BP and should resolve with BP normalization - pt wants to go home today and we have made an appointment with neurologist for follow up   Elevated troponin - Troponin peak at 0.1 -- Likely demand ischemia - Myoview: EF 41%, high risk study, large Defect of mild severity basal inferior and mid inferior location. Small defect severity present a lateral location. - Cardiac catheterization- Mild LAD lesion 35% stenosed. Moderate LV systolic dysfucntion, global hypokinesis, nonishemic/hypertensive CM, Combined heart failure - continue medical regimen outlined below with outpatient follow up   New Combined  systolic/diastolic HF - Patient does not appear to be volume overloaded - will be followed by cardiology team in an outpatient setting  Sepsis secondary to community-acquired pneumonia/Rhinovirus - Upon admission, patient was tachycardic, tachypneic, with leukocytosis - CTA chest no evidence of pulmonary embolism, multifocal pneumonia versus bronchopneumonia - Leukocytosis resolved - viral panel positive for rhinovirus   Sinus tachycardia - Resolved  Essential hypertension - Continue Norvasc and metoprolol, losartan  Morbid obesity - BMI 43.7  Possible sleep apnea - Will need outpatient sleep study, pt made aware    Code Status: Full  Family Communication: Pt declined my offer to update family   Disposition Plan: Home        Procedures/Studies: Dg Chest 2 View  03/19/2016  CLINICAL DATA:  Cough with shortness of breath for 3 days EXAM: CHEST  2 VIEW COMPARISON:  October 18, 2015 FINDINGS: Interstitium is mildly prominent in a generalized manner without edema or consolidation. Heart is upper normal in size with pulmonary vascularity within normal limits. No adenopathy. There is degenerative change in the thoracic spine. IMPRESSION: Interstitium prominent without frank edema or consolidation. Suspect chronic inflammatory type change causing the interstitial prominence. Stable cardiac silhouette. Electronically Signed   By: Bretta Bang III M.D.   On: 03/19/2016 17:53   Ct Angio Chest Pe W/cm &/or Wo Cm  03/19/2016  CLINICAL DATA:  50 year old male with shortness of breath EXAM: CT ANGIOGRAPHY CHEST WITH CONTRAST TECHNIQUE: Multidetector CT imaging of the chest was performed using the standard protocol during bolus administration of intravenous contrast. Multiplanar CT image reconstructions and MIPs were obtained to evaluate the vascular anatomy. CONTRAST:  80 cc Isovue 370 COMPARISON:  Chest radiograph dated 03/19/2016 and chest CT dated  11/29/2012 FINDINGS: There are  bibasilar linear consolidative changes as well as nodular densities in the lungs. There is apparent mild thickening of the bronchial walls the lung bases. Findings most compatible with multifocal pneumonia or bronchopneumonia. Clinical correlation is recommended. Scattered areas of nodularity noted in the upper lobes, evaluation is however limited due to respiratory motion artifact. There is no pleural effusion or pneumothorax. The central airways are patent. The thoracic aorta and the origins of the great vessels of the aortic arch appear patent. There is mild prominence of the main pulmonary trunk as well as pulmonary arteries indicative of a degree of pulmonary hypertension. There is no CT evidence of pulmonary embolism. There are bilateral hilar adenopathy. No mediastinal adenopathy. Go there is mild cardiomegaly. No pericardial effusion. Esophagus is grossly unremarkable. No thyroid nodules identified. There is no axillary adenopathy. The chest wall soft tissues appear unremarkable. There is degenerative changes of the spine. No acute fracture. Review of the MIP images confirms the above findings. IMPRESSION: No CT evidence of pulmonary embolism. Multi focal pneumonia versus bronchopneumonia predominantly involving the lung bases. clinical correlation and follow-up resolution recommended. Electronically Signed   By: Elgie Collard M.D.   On: 03/19/2016 21:14   Mr Brain Wo Contrast  03/25/2016  CLINICAL DATA:  Bilateral blurred vision EXAM: MRI HEAD WITHOUT CONTRAST TECHNIQUE: Multiplanar, multiecho pulse sequences of the brain and surrounding structures were obtained without intravenous contrast. COMPARISON:  None. FINDINGS: Ventricle size normal.  Cerebral volume normal. Negative for acute infarction. Patchy hyperintensity in the pons consistent with chronic microvascular ischemia. Negative for demyelinating disease.  Cerebral white matter normal. Negative for intracranial hemorrhage or fluid collection.  Negative for mass or edema.  No shift of the midline structures. Pituitary and skull base normal. Mucosal edema in the paranasal sinuses with mucous retention cyst bilaterally. No orbital lesion. IMPRESSION: Negative for acute infarct. Patchy hyperintensity in the pons bilaterally most consistent with chronic microvascular ischemia. Correlate with risk factors for atherosclerotic disease. Electronically Signed   By: Marlan Palau M.D.   On: 03/25/2016 16:44   Nm Myocar Multi W/spect W/wall Motion / Ef  03/23/2016   There was no ST segment deviation noted during stress.  T wave inversion was noted during stress in the II, III, aVF, V4 and V5 leads.  Defect 1: There is a large defect of mild severity present in the basal inferior and mid inferior location.  Defect 2: There is a small defect of mild severity present in the apical lateral location.  Findings consistent with ischemia.  Nuclear stress EF: 41%.  The left ventricular ejection fraction is moderately decreased (30-44%).  This is a high risk study.    Dg Chest Port 1 View  03/20/2016  CLINICAL DATA:  50 year old male which shortness of breath EXAM: PORTABLE CHEST 1 VIEW COMPARISON:  Chest CT dated 03/19/2016 and radiograph dated 03/19/2016 FINDINGS: Single portable view of the chest demonstrates areas of airspace density primarily involving the lower lobes, left greater right corresponding to the density seen on the CT. There is no pleural effusion or pneumothorax. Set top-normal cardiac silhouette no acute osseous pathology. IMPRESSION: Bilateral lower lung field of nodular and airspace densities as seen on the prior CT. Follow-up recommended. Electronically Signed   By: Elgie Collard M.D.   On: 03/20/2016 02:00   Consultations:  Cardiology  Neurology over the phone   Antibiotics:  None  Discharge Exam: Filed Vitals:   03/26/16 0659 03/26/16 0951  BP: 132/77 137/108  Pulse: 87 70  Temp:    Resp:     Filed Vitals:    03/26/16 0507 03/26/16 0519 03/26/16 0659 03/26/16 0951  BP:  151/91 132/77 137/108  Pulse:  76 87 70  Temp:  97.9 F (36.6 C)    TempSrc:  Oral    Resp:  16    Height:      Weight: 151.365 kg (333 lb 11.2 oz)     SpO2:  94%      General: Pt is alert, follows commands appropriately, not in acute distress Cardiovascular: Regular rate and rhythm, no rubs, no gallops Respiratory: Clear to auscultation bilaterally, no wheezing, no crackles, no rhonchi Abdominal: Soft, non tender, non distended, bowel sounds +, no guarding Extremities: no cyanosis, pulses palpable bilaterally DP and PT Neuro: Grossly nonfocal  Discharge Instructions  Discharge Instructions    Diet - low sodium heart healthy    Complete by:  As directed      Increase activity slowly    Complete by:  As directed             Medication List    STOP taking these medications        ALKA-SELTZER PLUS COLD & COUGH PO     azithromycin 250 MG tablet  Commonly known as:  ZITHROMAX     doxycycline 100 MG capsule  Commonly known as:  VIBRAMYCIN     NYQUIL COLD & FLU PO      TAKE these medications        albuterol 108 (90 Base) MCG/ACT inhaler  Commonly known as:  PROVENTIL HFA;VENTOLIN HFA  Inhale 2 puffs into the lungs every 4 (four) hours as needed for wheezing.     amLODipine 5 MG tablet  Commonly known as:  NORVASC  Take 1 tablet (5 mg total) by mouth daily.     aspirin 81 MG EC tablet  Take 1 tablet (81 mg total) by mouth daily.     chlorpheniramine-HYDROcodone 10-8 MG/5ML Suer  Commonly known as:  TUSSIONEX  Take 5 mLs by mouth every 12 (twelve) hours as needed for cough.     fluticasone 50 MCG/ACT nasal spray  Commonly known as:  FLONASE  Place 2 sprays into both nostrils daily.     guaiFENesin 600 MG 12 hr tablet  Commonly known as:  MUCINEX  Take 1 tablet (600 mg total) by mouth 2 (two) times daily.     hydrALAZINE 25 MG tablet  Commonly known as:  APRESOLINE  Take 1 tablet (25 mg  total) by mouth every 8 (eight) hours.     levalbuterol 1.25 MG/0.5ML nebulizer solution  Commonly known as:  XOPENEX  Take 1.25 mg by nebulization every 4 (four) hours as needed for wheezing or shortness of breath.     losartan 100 MG tablet  Commonly known as:  COZAAR  Take 1 tablet (100 mg total) by mouth daily.     metoprolol tartrate 25 MG tablet  Commonly known as:  LOPRESSOR  Take 1 tablet (25 mg total) by mouth 2 (two) times daily.            Follow-up Information    Follow up with [name], MD.   Specialty:  Family Medicine   Contact information:   [address] [place] [address] [phone]       Follow up with Anson Fret, MD On 04/02/2016.   Specialty:  Neurology   Why:  appointment scheduled for May  31st, 2017 at 10 am  but you must be there at 9:30 am   Contact information:   [address] [place] [address] [phone]       Call Debbora Presto, MD.   Specialty:  Internal Medicine   Why:  As needed call my cell phone [phone]   Contact information:   [address] [place] [address] [phone]        The results of significant diagnostics from this hospitalization (including imaging, microbiology, ancillary and laboratory) are listed below for reference.     Microbiology: Recent Results (from the past 240 hour(s))  Blood Culture (routine x 2)     Status: None   Collection Time: 03/19/16  5:33 PM  Result Value Ref Range Status   Specimen Description BLOOD RIGHT FOREARM  Final   Special Requests BOTTLES DRAWN AEROBIC AND ANAEROBIC 5CC  Final   Culture NO GROWTH 5 DAYS  Final   Report Status 03/24/2016 FINAL  Final  Blood Culture (routine x 2)     Status: None   Collection Time: 03/19/16  6:40 PM  Result Value Ref Range Status   Specimen Description BLOOD LEFT ANTECUBITAL  Final   Special Requests BOTTLES DRAWN AEROBIC AND ANAEROBIC 5CC  Final   Culture NO GROWTH 5 DAYS  Final    Report Status 03/24/2016 FINAL  Final  Respiratory Panel by PCR     Status: Abnormal   Collection Time: 03/19/16 11:59 PM  Result Value Ref Range Status   Adenovirus NOT DETECTED NOT DETECTED Final   Coronavirus 229E NOT DETECTED NOT DETECTED Final   Coronavirus HKU1 NOT DETECTED NOT DETECTED Final   Coronavirus NL63 NOT DETECTED NOT DETECTED Final   Coronavirus OC43 NOT DETECTED NOT DETECTED Final   Metapneumovirus NOT DETECTED NOT DETECTED Final   Rhinovirus / Enterovirus DETECTED (A) NOT DETECTED Final   Influenza A NOT DETECTED NOT DETECTED Final   Influenza A H1 NOT DETECTED NOT DETECTED Final   Influenza A H1 2009 NOT DETECTED NOT DETECTED Final   Influenza A H3 NOT DETECTED NOT DETECTED Final   Influenza B NOT DETECTED NOT DETECTED Final   Parainfluenza Virus 1 NOT DETECTED NOT DETECTED Final   Parainfluenza Virus 2 NOT DETECTED NOT DETECTED Final   Parainfluenza Virus 3 NOT DETECTED NOT DETECTED Final   Parainfluenza Virus 4 NOT DETECTED NOT DETECTED Final   Respiratory Syncytial Virus NOT DETECTED NOT DETECTED Final   Bordetella pertussis NOT DETECTED NOT DETECTED Final   Chlamydophila pneumoniae NOT DETECTED NOT DETECTED Final   Mycoplasma pneumoniae NOT DETECTED NOT DETECTED Final  MRSA PCR Screening     Status: None   Collection Time: 03/20/16  1:15 AM  Result Value Ref Range Status   MRSA by PCR NEGATIVE NEGATIVE Final    Comment:        The GeneXpert MRSA Assay (FDA approved for NASAL specimens only), is one component of a comprehensive MRSA colonization surveillance program. It is not intended to diagnose MRSA infection nor to guide or monitor treatment for MRSA infections.   Culture, sputum-assessment     Status: None   Collection Time: 03/20/16  7:42 PM  Result Value Ref Range Status   Specimen Description EXPECTORATED SPUTUM  Final   Special Requests Normal  Final   Sputum evaluation   Final    THIS SPECIMEN IS ACCEPTABLE. RESPIRATORY CULTURE REPORT  TO FOLLOW.   Report Status 03/21/2016 FINAL  Final  Culture,  respiratory (NON-Expectorated)     Status: None   Collection Time: 03/20/16  7:42 PM  Result Value Ref Range Status   Specimen Description EXPECTORATED SPUTUM  Final   Special Requests NONE  Final   Gram Stain   Final    ABUNDANT WBC PRESENT,BOTH PMN AND MONONUCLEAR FEW SQUAMOUS EPITHELIAL CELLS PRESENT MODERATE GRAM POSITIVE COCCI IN PAIRS IN CLUSTERS FEW GRAM NEGATIVE RODS THIS SPECIMEN IS ACCEPTABLE FOR SPUTUM CULTURE Performed at [facility]    Culture   Final    NORMAL OROPHARYNGEAL FLORA Performed at [facility]    Report Status 03/23/2016 FINAL  Final     Labs: Basic Metabolic Panel:  Recent Labs Lab 03/20/16 0725 03/21/16 0504 03/22/16 0242 03/23/16 0319 03/24/16 0328 03/26/16 0330  NA 140 141 139 138 137 137  K 3.5 4.1 4.2 4.0 3.6 3.8  CL 102 105 104 102 104 103  CO2 GLUCOSE 128* 145* 108* 113* 127* 108*  BUN CREATININE 1.12 1.16 1.18 1.22 1.04 1.20  CALCIUM 8.4* 8.5* 8.5* 8.6* 8.7* 8.9  MG 2.0  --   --   --   --   --   PHOS 3.8  --   --   --   --   --    Liver Function Tests:  Recent Labs Lab 03/19/16 1733 03/20/16 0725  AST 23 18  ALT 21 19  ALKPHOS 110 108  BILITOT 0.9 0.8  PROT 7.4 7.3  ALBUMIN 3.3* 3.3*   CBC:  Recent Labs Lab 03/22/16 0242 03/23/16 0319 03/24/16 0328 03/25/16 0233 03/26/16 0330  WBC 8.9 8.7 7.8 8.3 8.8  HGB 13.5 13.9 13.8 15.0 14.4  HCT 42.6 43.8 43.1 46.7 44.4  MCV 97.9 97.3 95.8 94.9 95.5  PLT 256 278 283 300 303   Cardiac Enzymes:  Recent Labs Lab 03/19/16 2010 03/20/16 0046 03/20/16 0725  TROPONINI 0.08* 0.08* 0.10*   BNP: BNP (last 3 results)  Recent Labs  03/19/16 2010 03/21/16 1328  BNP 326.2* 97.6    SIGNED: Time coordinating discharge:  30 minutes  MAGICK-Esmee Fallaw, MD  [facility] 03/26/2016, 11:13 AM Pager [phone]  If 7PM-7AM, please contact  night-coverage [email or web address] Password [email or web address]

## 2016-03-26 NOTE — Progress Notes (Addendum)
Patient Profile: 50 y/o male w/o prior cardiac hx who presented to [facility] on 5/17 with a three day h/o progressive dyspnea and cough and was found to be tachycardic with a mild troponin elevation. High risk stress test>> angiographic normal coronary arteries>> false positive nuclear stress test.   Subjective: Yesterday c/o blurred vision post cath. MRI obtained and negative for stroke. Still reports some blurred vision this morning.   Objective: Vital signs in last 24 hours: Temp:  [97.7 F (36.5 C)-98.4 F (36.9 C)] 97.9 F (36.6 C) (05/24 0519) Pulse Rate:  [68-87] 70 (05/24 0951) Resp:  [16-20] 16 (05/24 0519) BP: (110-151)/(57-108) 137/108 mmHg (05/24 0951) SpO2:  [94 %-96 %] 94 % (05/24 0519) Weight:  [333 lb 11.2 oz (151.365 kg)] 333 lb 11.2 oz (151.365 kg) (05/24 0507) Last BM Date: 03/25/16  Intake/Output from previous day: 05/23 0701 - 05/24 0700 In: 533 [P.O.:480; I.V.:3; IV Piggyback:50] Out: 250 [Urine:250] Intake/Output this shift:    Medications Current Facility-Administered Medications  Medication Dose Route Frequency Provider Last Rate Last Dose  . 0.9 %  sodium chloride infusion  250 mL Intravenous PRN Marykay Lex, MD      . 0.9 %  sodium chloride infusion  250 mL Intravenous PRN Marykay Lex, MD      . acetaminophen (TYLENOL) tablet 650 mg  650 mg Oral Q6H PRN Therisa Doyne, MD       Or  . acetaminophen (TYLENOL) suppository 650 mg  650 mg Rectal Q6H PRN Therisa Doyne, MD      . amLODipine (NORVASC) tablet 5 mg  5 mg Oral Daily Therisa Doyne, MD   5 mg at 03/26/16 0952  . antiseptic oral rinse (CPC / CETYLPYRIDINIUM CHLORIDE 0.05%) solution 7 mL  7 mL Mouth Rinse BID Maryann Mikhail, DO   7 mL at 03/26/16 0953  . aspirin EC tablet 81 mg  81 mg Oral Daily Therisa Doyne, MD   81 mg at 03/26/16 0951  . benzonatate (TESSALON) capsule 200 mg  200 mg Oral TID Maryann Mikhail, DO   200 mg at 03/26/16 0951  . chlorpheniramine-HYDROcodone  (TUSSIONEX) 10-8 MG/5ML suspension 5 mL  5 mL Oral Q12H PRN Maryann Mikhail, DO   5 mL at 03/24/16 0146  . enoxaparin (LOVENOX) injection 40 mg  40 mg Subcutaneous Q24H Marykay Lex, MD   40 mg at 03/25/16 0844  . fluticasone (FLONASE) 50 MCG/ACT nasal spray 2 spray  2 spray Each Nare Daily Maryann Mikhail, DO   2 spray at 03/26/16 0953  . guaiFENesin (MUCINEX) 12 hr tablet 600 mg  600 mg Oral BID Therisa Doyne, MD   600 mg at 03/26/16 0951  . hydrALAZINE (APRESOLINE) injection 10 mg  10 mg Intravenous Q6H PRN Maryann Mikhail, DO   10 mg at 03/23/16 0647  . hydrALAZINE (APRESOLINE) tablet 25 mg  25 mg Oral Q8H Brittainy M Simmons, PA-C   25 mg at 03/26/16 0520  . ipratropium (ATROVENT) nebulizer solution 0.5 mg  0.5 mg Nebulization Q4H PRN Maryann Mikhail, DO      . levalbuterol (XOPENEX) nebulizer solution 1.25 mg  1.25 mg Nebulization Q4H PRN Maryann Mikhail, DO      . losartan (COZAAR) tablet 100 mg  100 mg Oral Daily Brittainy Sherlynn Carbon, PA-C   100 mg at 03/26/16 0951  . metoprolol tartrate (LOPRESSOR) tablet 25 mg  25 mg Oral BID Ok Anis, NP   25 mg at 03/26/16 0951  . ondansetron (  ZOFRAN) tablet 4 mg  4 mg Oral Q6H PRN Therisa Doyne, MD       Or  . ondansetron (ZOFRAN) injection 4 mg  4 mg Intravenous Q6H PRN Therisa Doyne, MD      . pantoprazole (PROTONIX) EC tablet 40 mg  40 mg Oral Daily Maryann Mikhail, DO   40 mg at 03/26/16 7096  . sodium chloride 0.9 % bolus 1,000 mL  1,000 mL Intravenous Once Therisa Doyne, MD      . sodium chloride flush (NS) 0.9 % injection 3 mL  3 mL Intravenous Q12H Therisa Doyne, MD   3 mL at 03/25/16 2212  . sodium chloride flush (NS) 0.9 % injection 3 mL  3 mL Intravenous Q12H Marykay Lex, MD   0 mL at 03/24/16 2122  . sodium chloride flush (NS) 0.9 % injection 3 mL  3 mL Intravenous PRN Marykay Lex, MD      . sodium chloride flush (NS) 0.9 % injection 3 mL  3 mL Intravenous Q12H Marykay Lex, MD   3 mL at  03/24/16 2121  . sodium chloride flush (NS) 0.9 % injection 3 mL  3 mL Intravenous PRN Marykay Lex, MD        PE: General appearance: alert, cooperative, no distress and moderately obese  Neck: no carotid bruit and no JVD Lungs: clear to auscultation bilaterally Heart: regular rate and rhythm, S1, S2 normal, no murmur, click, rub or gallop Extremities: no LEE Pulses: 2+ and symmetric Skin: warm and dry Neurologic: Grossly normal  Lab Results:   Recent Labs  03/24/16 0328 03/25/16 0233 03/26/16 0330  WBC 7.8 8.3 8.8  HGB 13.8 15.0 14.4  HCT 43.1 46.7 44.4  PLT 283 300 303   BMET  Recent Labs  03/24/16 0328 03/26/16 0330  NA 137 137  K 3.6 3.8  CL 104 103  CO2 24 22  GLUCOSE 127* 108*  BUN 11 18  CREATININE 1.04 1.20  CALCIUM 8.7* 8.9   Cholesterol  Recent Labs  03/24/16 1108  CHOL 164   Studies/Results: Procedures    Left Heart Cath and Coronary Angiography    Conclusion    1. Mid LAD lesion, 35% stenosed. 2. There is moderate left ventricular systolic dysfunction, global hypokinesis - with severely elevated LVEDP 3. 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 Extremely tortuous coronary arteries suggestive of severe hypertension.   Plan:  Return to nursing unit for ongoing care with TR band removal  Will dose IV Lasix upon arrival to floor.  Continue aggressive risk factor modification with blood pressure control and diuresis.     Assessment/Plan  Active Problems:   Essential hypertension   SIRS (systemic inflammatory response syndrome) (HCC)   Elevated troponin   CAP (community acquired pneumonia)   Cough   Dyspnea   Sepsis (HCC)   SOB (shortness of breath)   Tachycardia   Tachypnea   T wave inversion in EKG   Diastolic congestive heart failure (HCC)   Abnormal nuclear stress test   Atrial  fibrillation (HCC)   1. Abnormal NST: false positive. LHC 03/24/16 revealed angiographic normal coronary arteries. Radial cath site is stable.   2. HTN: poorly controlled. Most recent BP is 137/108. Dc'ed  lisinopril and changed to losartan 100 mg and add hydralazine 25 mg BID yesterday with some improvement. Continue amlodipine and metoprolol.   3. Blurred Vision: bilateral.  Symptom onset occurred following radial cath. Neuro exam benign. No other deficits.  MRI done yesterday which was negative for stroke. Neurology suggested to have patient follow up with ophthalmology. This is felt to be more so related to hx of uncontrolled HTN. Will schedule a follow up in the office related to blood pressure control.  -Stable for D/c from cardiology stand point   LOS: 6 days   Laverda Page NP-C 03/26/2016 10:52 AM   Agree with note by Laverda Page NP-C  Still has blurred vision. MRI neg. Clean cors at cath. NISCM prob from HHD. Needs better BP control. OK for DC home from our point of view  Runell Gess, M.D., FACP, [facility], Kathryne Eriksson [facility] [address] [place], [place]  [address]  [phone] 03/26/2016 12:12 PM

## 2016-03-26 NOTE — Discharge Instructions (Signed)
Hypertension Hypertension, commonly called high blood pressure, is when the force of blood pumping through your arteries is too strong. Your arteries are the blood vessels that carry blood from your heart throughout your body. A blood pressure reading consists of a higher number over a lower number, such as 110/72. The higher number (systolic) is the pressure inside your arteries when your heart pumps. The lower number (diastolic) is the pressure inside your arteries when your heart relaxes. Ideally you want your blood pressure below 120/80. Hypertension forces your heart to work harder to pump blood. Your arteries may become narrow or stiff. Having untreated or uncontrolled hypertension can cause heart attack, stroke, kidney disease, and other problems. RISK FACTORS Some risk factors for high blood pressure are controllable. Others are not.  Risk factors you cannot control include:   Race. You may be at higher risk if you are African American.  Age. Risk increases with age.  Gender. Men are at higher risk than women before age 45 years. After age 65, women are at higher risk than men. Risk factors you can control include:  Not getting enough exercise or physical activity.  Being overweight.  Getting too much fat, sugar, calories, or salt in your diet.  Drinking too much alcohol. SIGNS AND SYMPTOMS Hypertension does not usually cause signs or symptoms. Extremely high blood pressure (hypertensive crisis) may cause headache, anxiety, shortness of breath, and nosebleed. DIAGNOSIS To check if you have hypertension, your health care provider will measure your blood pressure while you are seated, with your arm held at the level of your heart. It should be measured at least twice using the same arm. Certain conditions can cause a difference in blood pressure between your right and left arms. A blood pressure reading that is higher than normal on one occasion does not mean that you need treatment. If  it is not clear whether you have high blood pressure, you may be asked to return on a different day to have your blood pressure checked again. Or, you may be asked to monitor your blood pressure at home for 1 or more weeks. TREATMENT Treating high blood pressure includes making lifestyle changes and possibly taking medicine. Living a healthy lifestyle can help lower high blood pressure. You may need to change some of your habits. Lifestyle changes may include:  Following the DASH diet. This diet is high in fruits, vegetables, and whole grains. It is low in salt, red meat, and added sugars.  Keep your sodium intake below 2,300 mg per day.  Getting at least 30-45 minutes of aerobic exercise at least 4 times per week.  Losing weight if necessary.  Not smoking.  Limiting alcoholic beverages.  Learning ways to reduce stress. Your health care provider may prescribe medicine if lifestyle changes are not enough to get your blood pressure under control, and if one of the following is true:  You are 18-59 years of age and your systolic blood pressure is above 140.  You are 60 years of age or older, and your systolic blood pressure is above 150.  Your diastolic blood pressure is above 90.  You have diabetes, and your systolic blood pressure is over 140 or your diastolic blood pressure is over 90.  You have kidney disease and your blood pressure is above 140/90.  You have heart disease and your blood pressure is above 140/90. Your personal target blood pressure may vary depending on your medical conditions, your age, and other factors. HOME CARE INSTRUCTIONS    Have your blood pressure rechecked as directed by your health care provider.   Take medicines only as directed by your health care provider. Follow the directions carefully. Blood pressure medicines must be taken as prescribed. The medicine does not work as well when you skip doses. Skipping doses also puts you at risk for  problems.  Do not smoke.   Monitor your blood pressure at home as directed by your health care provider. SEEK MEDICAL CARE IF:   You think you are having a reaction to medicines taken.  You have recurrent headaches or feel dizzy.  You have swelling in your ankles.  You have trouble with your vision. SEEK IMMEDIATE MEDICAL CARE IF:  You develop a severe headache or confusion.  You have unusual weakness, numbness, or feel faint.  You have severe chest or abdominal pain.  You vomit repeatedly.  You have trouble breathing. MAKE SURE YOU:   Understand these instructions.  Will watch your condition.  Will get help right away if you are not doing well or get worse.   This information is not intended to replace advice given to you by your health care provider. Make sure you discuss any questions you have with your health care provider.   Document Released: 10/20/2005 Document Revised: 03/06/2015 Document Reviewed: 08/12/2013 Elsevier Interactive Patient Education 2016 [employer]  

## 2016-04-02 ENCOUNTER — Encounter: Payer: Self-pay | Admitting: Neurology

## 2016-04-02 ENCOUNTER — Ambulatory Visit (INDEPENDENT_AMBULATORY_CARE_PROVIDER_SITE_OTHER): Payer: BLUE CROSS/BLUE SHIELD | Admitting: Neurology

## 2016-04-02 ENCOUNTER — Other Ambulatory Visit: Payer: Self-pay | Admitting: *Deleted

## 2016-04-02 VITALS — BP 150/100 | HR 80 | Ht 75.0 in | Wt 350.2 lb

## 2016-04-02 DIAGNOSIS — H04123 Dry eye syndrome of bilateral lacrimal glands: Secondary | ICD-10-CM | POA: Diagnosis not present

## 2016-04-02 DIAGNOSIS — H5372 Impaired contrast sensitivity: Secondary | ICD-10-CM | POA: Diagnosis not present

## 2016-04-02 DIAGNOSIS — H53132 Sudden visual loss, left eye: Secondary | ICD-10-CM | POA: Diagnosis not present

## 2016-04-02 DIAGNOSIS — H348122 Central retinal vein occlusion, left eye, stable: Secondary | ICD-10-CM | POA: Diagnosis not present

## 2016-04-02 DIAGNOSIS — H2513 Age-related nuclear cataract, bilateral: Secondary | ICD-10-CM | POA: Diagnosis not present

## 2016-04-02 NOTE — Patient Instructions (Addendum)
Remember to drink plenty of fluid, eat healthy meals and do not skip any meals. Try to eat protein with a every meal and eat a healthy snack such as fruit or nuts in between meals. Try to keep a regular sleep-wake schedule and try to exercise daily, particularly in the form of walking, 20-30 minutes a day, if you can.   As far as your medications are concerned, I would like to suggest: continue ASA 81mg  daily  As far as diagnostic testing: labs, carotid dopplers, ophthalmology appointment  Our phone number is [phone]. We also have an after hours call service for urgent matters and there is a physician on-call for urgent questions. For any emergencies you know to call 911 or go to the nearest emergency room

## 2016-04-02 NOTE — Progress Notes (Signed)
[id number]    Provider:  Dr Jaynee Eagles Referring Provider: Harvie Junior, MD Primary Care Physician:  Harvie Junior, MD  CC:  Acute left eye vision loss after procedure.  HPI:  Luis [name] is a 50 y.o. male here as a referral from Dr. Jimmye Norman for blurry vision PMHx poor;y controlled HTN, morbid obesity, CHF, tobacco abuse. He went into the hospital for SOB and he was found to be to be tachycardic with a mild troponin elevation, diagnosed with CAP,  Unstable Angina with Abnormal Nuclear ST and he subsequently had had a cardiac catheterization 03/24/2016 and Coronary Angiography , vision has been blurry ever since. The vision has been blurry in the left eye. He noticed it acutely after the procedure. Stable, not worsening, blurry. The right eye is ok, it is is really the left eye. No headaches, no focal weakness, no aphasia or dysphagia, no jaw pain or shoulder pain. He has not seen an ophthalmologist. Continuous symptoms, nothing makes it better or worse. He can't see distance or near, just as blurry, difficulty reading computer screens for example or the clock on the wall. He had an MRI of the brain due to vision changes which did not show any infarcts. No pain on eye movements, no floaters.   Reviewed notes, labs and imaging from outside physicians, which showed:   (personally reviewed MRI and agree with the following):  Ventricle size normal. Cerebral volume normal.  Negative for acute infarction. Patchy hyperintensity in the pons consistent with chronic microvascular ischemia.  Negative for demyelinating disease. Cerebral white matter normal.  Negative for intracranial hemorrhage or fluid collection.  Negative for mass or edema. No shift of the midline structures.  Pituitary and skull base normal.  Mucosal edema in the paranasal sinuses with mucous retention cyst bilaterally. No orbital lesion.  IMPRESSION: Negative for acute infarct. Patchy  hyperintensity in the pons bilaterally most consistent with chronic microvascular ischemia.Correlate with risk factors for atherosclerotic disease.  Diagnosis:  1. Mid LAD lesion, 35% stenosed. 2. There is moderate left ventricular systolic dysfunction, global hypokinesis - with severely elevated LVEDP 3. Nonischemic/hypertensive cardiomyopathy 4. Combined systolic and diastolic heart failure  -Myoview: EF 41%, high risk study, large Defect of mild severity basal inferior and mid inferior location. Small defect severity present a lateral location. -Cardiac catheterization- Mild LAD lesion 35% stenosed. Moderate LV systolic dysfucntion, global hypokinesis, nonishemic/hypertensive CM, Combined heart failure   Review of Systems: Patient complains of symptoms per HPI as well as the following symptoms; SOB. Pertinent negatives per HPI. All others negative.   Social History   Social History  . Marital Status: Single    Spouse Name: N/A  . Number of Children: 1  . Years of Education: some colle   Occupational History  . [employer]    Social History Main Topics  . Smoking status: Former Smoker -- 0.50 packs/day for 30 years    Types: Cigarettes    Quit date: 03/21/2015  . Smokeless tobacco: Never Used  . Alcohol Use: No  . Drug Use: No  . Sexual Activity: Yes   Other Topics Concern  . Not on file   Social History Narrative   Lives in [place] with mother and three grandchildren.     Works for [employer] - works from home.     Does not routinely exercise.   Caffeine use: none    Family History  Problem Relation Age of Onset  . Diabetes type II Mother   .  Diabetes type II Other   . Stroke Neg Hx   . Cancer Neg Hx   . Other      never knew father    Past Medical History  Diagnosis Date  . Essential hypertension   . Morbid obesity ([place])   . History of tobacco abuse   . High cholesterol   . Pneumonia ~ 12/2015; 03/19/2016    Past Surgical History    Procedure Laterality Date  . Femur im nail Right 2012  . Fracture surgery    . Cardiac catheterization N/A 03/24/2016    Procedure: Left Heart Cath and Coronary Angiography;  Surgeon: Leonie Man, MD;  Location: [facility];  Service: Cardiovascular;  Laterality: N/A;    Current Outpatient Prescriptions  Medication Sig Dispense Refill  . albuterol (PROVENTIL HFA;VENTOLIN HFA) 108 (90 BASE) MCG/ACT inhaler Inhale 2 puffs into the lungs every 4 (four) hours as needed for wheezing. 1 Inhaler 0  . amLODipine (NORVASC) 5 MG tablet Take 1 tablet (5 mg total) by mouth daily. 30 tablet 0  . aspirin EC 81 MG EC tablet Take 1 tablet (81 mg total) by mouth daily. 30 tablet 0  . chlorpheniramine-HYDROcodone (TUSSIONEX) 10-8 MG/5ML SUER Take 5 mLs by mouth every 12 (twelve) hours as needed for cough. 140 mL 0  . fluticasone (FLONASE) 50 MCG/ACT nasal spray Place 2 sprays into both nostrils daily. 16 g 3  . guaiFENesin (MUCINEX) 600 MG 12 hr tablet Take 1 tablet (600 mg total) by mouth 2 (two) times daily. 60 tablet 0  . hydrALAZINE (APRESOLINE) 25 MG tablet Take 1 tablet (25 mg total) by mouth every 8 (eight) hours. 90 tablet 1  . levalbuterol (XOPENEX) 1.25 MG/0.5ML nebulizer solution Take 1.25 mg by nebulization every 4 (four) hours as needed for wheezing or shortness of breath. 500 mL 5  . losartan (COZAAR) 100 MG tablet Take 1 tablet (100 mg total) by mouth daily. 30 tablet 0  . metoprolol tartrate (LOPRESSOR) 25 MG tablet Take 1 tablet (25 mg total) by mouth 2 (two) times daily. 60 tablet 0   No current facility-administered medications for this visit.    Allergies as of 04/02/2016 - Review Complete 04/02/2016  Allergen Reaction Noted  . Morphine and related Itching 11/29/2012    Vitals: BP 150/100 mmHg  Pulse 80  Ht '6\' 3"'$  (1.905 m)  Wt 350 lb 3.2 oz (158.85 kg)  BMI 43.77 kg/m2  SpO2 94% Last Weight:  Wt Readings from Last 1 Encounters:  04/02/16 350 lb 3.2 oz (158.85 kg)    Last Height:   Ht Readings from Last 1 Encounters:  04/02/16 '6\' 3"'$  (1.905 m)   Physical exam: Exam: Gen: NAD, conversant, well nourised, obese, well groomed                     CV: RRR, no MRG. No Carotid Bruits. No peripheral edema, warm, nontender Eyes: Conjunctivae clear without exudates or hemorrhage  Neuro: Detailed Neurologic Exam  Speech:    Speech is normal; fluent and spontaneous with normal comprehension.  Cognition:    The patient is oriented to person, place, and time;     recent and remote memory intact;     language fluent;     normal attention, concentration,     fund of knowledge Cranial Nerves:    The pupils are equal, round, and reactive to light. Right fundi appears unremarkable but limited view, could not visualize left fundi.  Visual  fields are full to finger confrontation. Extraocular movements are intact. Trigeminal sensation is intact and the muscles of mastication are normal. The face is symmetric. The palate elevates in the midline. Hearing intact. Voice is normal. Shoulder shrug is normal. The tongue has normal motion without fasciculations.   Coordination:    No dysmetira  Gait:    Wide based likely due to very large body habitus  Motor Observation:    No asymmetry, no atrophy, and no involuntary movements noted. Tone:    Normal muscle tone.    Posture:    Posture is normal. normal erect    Strength:    Strength is V/V in the upper and lower limbs.      Sensation: intact to LT     Reflex Exam:  DTR's: Absent AJs, otherwise deep tendon reflexes in the upper and lower extremities are normal bilaterally.   Toes:    The toes are downgoing bilaterally.   Clonus:    Clonus is absent.       Assessment/Plan:   50 y.o. male here as a referral from Dr. Jimmye Norman for blurry vision PMHx poorly controlled HTN, morbid obesity, CHF, tobacco abuse. He went into the hospital for SOB and he was found to be to be tachycardic with a mild troponin  elevation, diagnosed with CAP,  Unstable Angina with Abnormal Nuclear ST and he subsequently had had a cardiac catheterization 03/24/2016 and Coronary Angiography , vision has been blurry ever since. MRi negative for acute event. Will order carotid dopplers. May be embolic secondary to procedure vs ocular cause. Needs evaluation with ophthalmology. (Afib not confirmed per cardiology notes, on asa '81mg'$ ).  Will order carotid dopplers. Will wait for evaluation with ophthalmology before considering any other imaging such as mri orbits Esr, crp today however given recent pneumonia and CHF this may be elevated due to illness.  Continue asa '81mg'$   Cc Andee Poles, MD  [facility] [address] [place], [place] [address]  Phone [phone] Fax [phone]

## 2016-04-03 ENCOUNTER — Encounter: Payer: Self-pay | Admitting: Physician Assistant

## 2016-04-03 ENCOUNTER — Telehealth: Payer: Self-pay | Admitting: *Deleted

## 2016-04-03 ENCOUNTER — Ambulatory Visit (INDEPENDENT_AMBULATORY_CARE_PROVIDER_SITE_OTHER): Payer: BLUE CROSS/BLUE SHIELD | Admitting: Physician Assistant

## 2016-04-03 VITALS — BP 134/96 | HR 104 | Ht 75.0 in | Wt 349.2 lb

## 2016-04-03 DIAGNOSIS — I5030 Unspecified diastolic (congestive) heart failure: Secondary | ICD-10-CM | POA: Diagnosis not present

## 2016-04-03 DIAGNOSIS — I471 Supraventricular tachycardia: Secondary | ICD-10-CM | POA: Diagnosis not present

## 2016-04-03 DIAGNOSIS — I5022 Chronic systolic (congestive) heart failure: Secondary | ICD-10-CM

## 2016-04-03 DIAGNOSIS — I491 Atrial premature depolarization: Secondary | ICD-10-CM

## 2016-04-03 DIAGNOSIS — I4891 Unspecified atrial fibrillation: Secondary | ICD-10-CM

## 2016-04-03 DIAGNOSIS — R0683 Snoring: Secondary | ICD-10-CM

## 2016-04-03 DIAGNOSIS — R0602 Shortness of breath: Secondary | ICD-10-CM

## 2016-04-03 DIAGNOSIS — I4719 Other supraventricular tachycardia: Secondary | ICD-10-CM

## 2016-04-03 LAB — SEDIMENTATION RATE: Sed Rate: 39 mm/hr — ABNORMAL HIGH (ref 0–15)

## 2016-04-03 LAB — C-REACTIVE PROTEIN: CRP: 3.9 mg/L (ref 0.0–4.9)

## 2016-04-03 MED ORDER — CARVEDILOL 6.25 MG PO TABS: 6.2500 mg | ORAL_TABLET | Freq: Two times a day (BID) | ORAL | Status: DC

## 2016-04-03 MED ORDER — LOSARTAN POTASSIUM 25 MG PO TABS
100.0000 mg | ORAL_TABLET | Freq: Every day | ORAL | Status: DC
Start: 2016-04-03 — End: 2016-04-03

## 2016-04-03 MED ORDER — LOSARTAN POTASSIUM 25 MG PO TABS: 100.0000 mg | ORAL_TABLET | Freq: Every day | ORAL | Status: DC

## 2016-04-03 NOTE — Progress Notes (Addendum)
CARDIOLOGY OFFICE NOTE  Date:  04/03/2016    Luis [name] Date of Birth: 1966/01/24 Medical Record #[id number]  PCP:  [name], MD  Cardiologist:  Dr. Clifton James   Chief Complaint  Patient presents with  . Hospitalization Follow-up    seen for Dr. Clifton James    History of Present Illness: Luis [name] is a 50 y.o. male who presents today for post hospital follow-up from 5/17-5/24. He has past medical history of hypertension, obesity and tobacco abuse (quit 2016). He was previously seen in the ED in December 2016 for dyspnea cough and diagnosed with community-acquired pneumonia. 3 days prior to this admission, he began to experience productive cough, malaise, progressive dyspnea associated with diaphoresis. He had cough with green productive sputum. There was concern for atrial fibrillation however EKG shows sinus tach with frequent PACs. Chest CT showed multifocal pneumonia. He was admitted by internal medicine and placed on IV antibiotic. His troponin was mildly elevated. Cardiology consulted for elevated troponin which was felt to be demand ischemia in the setting of pneumonia. Echocardiogram obtained on 03/21/2016 showed EF 35-40%, diffuse hypokinesis, grade 2 diastolic dysfunction. He underwent a Myoview on 5/21 showed EF 41%, suspected small area of prior infarct ischemia involving the lateral aspect of LV apex, suspected mild large territory infarct involving the inferior wall of the left ventricle. He eventually underwent cardiac cath on 5/22 which showed 35% mid LAD lesion, severely elevated LVEDP of 39, EF 35-40% likely represent nonischemic cardiomyopathy. Post cath, he did complain of blurred vision, brain MRI did not show obvious stroke. It was felt the blurred vision maybe related to uncontrolled blood pressure, neurology recommended followup with ophthalmology.   Since discharge, it appears patient continued to have blurred vision especially in the left eye ever  since recent cardiac catheterization. He has been seen by neurology on 5/31, at which time he still has not been followed by ophthalmology. Per neurology recommendation, will obtain carotid Doppler, await evaluation ophthalmology before considering repeat MRI of the orbits. He was seen by ophthalmology yesterday who prescribed eyedrops. He presents today for cardiology outpatient follow-up. He says he has not restarted on any medication at this time as he was not sure if he is able to pay for his visit and he wanted to finish all the follow-up before purchasing his medications. His blood pressure today is 134/96. Heart rate was mildly tachycardic at 104. Look at his medication list, he is on amlodipine 5 mg, hydralazine 25 mg 3 times a day, losartan 100 mg daily, metoprolol 25 mg twice a day. I am hesitant to restart all 4 medications at the same time as it may drop his blood pressure by too much. I will restart two at a time. Given his LV dysfunction, I will change metoprolol to carvedilol 6.25 mg twice a day. I will lower losartan to 25 mg daily. If his blood pressure is still high on follow-up, I will gradually up titrate losartan and add hydralazine and maybe amlodipine as well. He has snoring problem, given his morbid obesity, he will need outpatient sleep study. I will see him back in 2 weeks to reassess his blood pressure. Otherwise he says his blurred vision is improving. He still cough however there is improvement as well.     Past Medical History  Diagnosis Date  . Essential hypertension   . Morbid obesity (HCC)   . History of tobacco abuse   . High cholesterol   . Pneumonia ~  12/2015; 03/19/2016    Past Surgical History  Procedure Laterality Date  . Femur im nail Right 2012  . Fracture surgery    . Cardiac catheterization N/A 03/24/2016    Procedure: Left Heart Cath and Coronary Angiography;  Surgeon: Marykay Lex, MD;  Location: [facility] INVASIVE CV LAB;  Service: Cardiovascular;   Laterality: N/A;     Medications: Current Outpatient Prescriptions  Medication Sig Dispense Refill  . albuterol (PROVENTIL HFA;VENTOLIN HFA) 108 (90 BASE) MCG/ACT inhaler Inhale 2 puffs into the lungs every 4 (four) hours as needed for wheezing. 1 Inhaler 0  . aspirin EC 81 MG EC tablet Take 1 tablet (81 mg total) by mouth daily. 30 tablet 0  . chlorpheniramine-HYDROcodone (TUSSIONEX) 10-8 MG/5ML SUER Take 5 mLs by mouth every 12 (twelve) hours as needed for cough. 140 mL 0  . fluticasone (FLONASE) 50 MCG/ACT nasal spray Place 2 sprays into both nostrils daily. 16 g 3  . guaiFENesin (MUCINEX) 600 MG 12 hr tablet Take 1 tablet (600 mg total) by mouth 2 (two) times daily. 60 tablet 0  . hydrALAZINE (APRESOLINE) 25 MG tablet Take 1 tablet (25 mg total) by mouth every 8 (eight) hours. 90 tablet 1  . levalbuterol (XOPENEX) 1.25 MG/0.5ML nebulizer solution Take 1.25 mg by nebulization every 4 (four) hours as needed for wheezing or shortness of breath. 500 mL 5  . losartan (COZAAR) 25 MG tablet Take 4 tablets (100 mg total) by mouth daily. 90 tablet 3  . carvedilol (COREG) 6.25 MG tablet Take 1 tablet (6.25 mg total) by mouth 2 (two) times daily with a meal. 180 tablet 3   No current facility-administered medications for this visit.    Allergies: Allergies  Allergen Reactions  . Morphine And Related Itching    Social History: The patient  reports that he quit smoking about a year ago. His smoking use included Cigarettes. He has a 15 pack-year smoking history. He has never used smokeless tobacco. He reports that he does not drink alcohol or use illicit drugs.   Family History: The patient's family history includes Diabetes type II in his mother and other. There is no history of Stroke or Cancer.   Review of Systems: Please see the history of present illness.   Otherwise, the review of systems is positive for blurred vision, cough.   All other systems are reviewed and negative.   Physical  Exam: VS:  BP 134/96 mmHg  Pulse 104  Ht 6\' 3"  (1.905 m)  Wt 349 lb 3.2 oz (158.396 kg)  BMI 43.65 kg/m2  SpO2 92% .  BMI Body mass index is 43.65 kg/(m^2).  Wt Readings from Last 3 Encounters:  04/03/16 349 lb 3.2 oz (158.396 kg)  04/02/16 350 lb 3.2 oz (158.85 kg)  03/26/16 333 lb 11.2 oz (151.365 kg)    General: Pleasant. Well developed, well nourished and in no acute distress.  HEENT: Normal. Neck: Supple, no JVD, carotid bruits, or masses noted.  Cardiac: Regular rate and rhythm. No murmurs, rubs, or gallops. 2+edema.  Respiratory:  Lungs are clear to auscultation bilaterally with normal work of breathing.  GI: Soft and nontender.  MS: No deformity or atrophy. Gait and ROM intact. Skin: Warm and dry. Color is normal.  Neuro:  Strength and sensation are intact and no gross focal deficits noted.  Psych: Alert, appropriate and with normal affect.   LABORATORY DATA:  EKG:  EKG is ordered today. This demonstrates sinus tach with PACs, TWI in lead III  and aVF.  Lab Results  Component Value Date   WBC 8.8 03/26/2016   HGB 14.4 03/26/2016   HCT 44.4 03/26/2016   PLT 303 03/26/2016   GLUCOSE 108* 03/26/2016   CHOL 164 03/24/2016   TRIG 193* 03/24/2016   HDL 23* 03/24/2016   LDLCALC 102* 03/24/2016   ALT 19 03/20/2016   AST 18 03/20/2016   NA 137 03/26/2016   K 3.8 03/26/2016   CL 103 03/26/2016   CREATININE 1.20 03/26/2016   BUN 18 03/26/2016   CO2 22 03/26/2016   TSH 0.777 03/19/2016   INR 1.15 03/19/2016   HGBA1C 7.8* 03/20/2016    BNP (last 3 results)  Recent Labs  03/19/16 2010 03/21/16 1328  BNP 326.2* 97.6     Other Studies Reviewed Today:  Echo 03/21/2016 LV EF: 35% - 40%  ------------------------------------------------------------------- Indications: Atrial fibrillation - 427.31.  ------------------------------------------------------------------- History: PMH: Multifocal PNA. Elevated troponin.  Essential hypertension.  ------------------------------------------------------------------- Study Conclusions  - Left ventricle: The cavity size was moderately dilated. Wall  thickness was increased in a pattern of moderate LVH. Systolic  function was moderately reduced. The estimated ejection fraction  was in the range of 35% to 40%. Diffuse hypokinesis. Features are  consistent with a pseudonormal left ventricular filling pattern,  with concomitant abnormal relaxation and increased filling  pressure (grade 2 diastolic dysfunction). Doppler parameters are  consistent with high ventricular filling pressure. - Left atrium: The atrium was mildly dilated.  Impressions:  - Definity used; moderate global reduction in LV function; moderate  LVH and LVE; grade 2 diastolic dysfunction with elevated LV  filling pressue; mild LAE.   Myoview 03/23/2016 IMPRESSION: 1. Suspected small area of peri-infarct ischemia involving the lateral aspect of the left ventricular apex. 2. Suspected mild large territory infarct involving the inferior wall of the left ventricle. 3. Marked left ventricular dilatation with global hypokinesia. Ejection fraction - 41%. 4. Non invasive risk stratification*: High   Cath 03/24/2016 Conclusion    1. Mid LAD lesion, 35% stenosed. 2. There is moderate left ventricular systolic dysfunction, global hypokinesis - with severely elevated LVEDP 3. 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 Extremely tortuous coronary arteries suggestive of severe hypertension.   Plan:  Return to nursing unit for ongoing care with TR band removal  Will dose IV Lasix upon arrival to floor.  Continue aggressive risk factor modification with blood pressure control and diuresis.     MRI of brain 03/25/2016 FINDINGS: Ventricle size  normal. Cerebral volume normal.  Negative for acute infarction. Patchy hyperintensity in the pons consistent with chronic microvascular ischemia.  Negative for demyelinating disease. Cerebral white matter normal.  Negative for intracranial hemorrhage or fluid collection.  Negative for mass or edema. No shift of the midline structures.  Pituitary and skull base normal.  Mucosal edema in the paranasal sinuses with mucous retention cyst bilaterally. No orbital lesion.  IMPRESSION: Negative for acute infarct. Patchy hyperintensity in the pons bilaterally most consistent with chronic microvascular ischemia. Correlate with risk factors for atherosclerotic disease.   Assessment/Plan:  1. HTN  - BP 134/96 which is surprising given recent HTN and patient has not restart on any of his BP meds since discharge. Given LV dysfunction, will add coregl 6.25mg  BID, stop metoprolol. Decrease losartan to  daily. Will hold hydralazine and amlodipine for now. Followup in 2 weeks to track BP.  2. NICM with baseline EF 35-40% /  LV dysfunction  - Echo 03/21/2016 EF 35-40%, grade 2 DD.  - mild LE edema, lung clear, instructed leg elevation and continuous observation for now  - recheck edema on followup, note cath 03/24/2016 showed moderate LV dysfunction and LVEDP 39. Low threshold to add lasix if has worsening edema  3. Minimal CAD: falsely positive myoview after echo showed LV dysfunction. Continue ASA. Currently not on a statin. Last lipid panel 03/24/2016 chol 164, trig 193, HDL 23, LDL 102.   4. Blurry vision: continue to followup with ophthalmology and neurology  5. Snoring with morbid obesity: plan for outpatient sleep study   Current medicines are reviewed with the patient today.  The patient does not have concerns regarding medicines other than what has been noted above.  The following changes have been made:  See above.  Labs/ tests ordered today include:    Orders Placed  This Encounter  Procedures  . EKG 12-Lead  . Split night study     Disposition:   FU with APP in 2 weeks.   Patient is agreeable to this plan and will call if any problems develop in the interim.   Signed: Azalee Course PA-C 04/03/2016 1:08 PM  [facility] [address] [place], [place]  [address] Phone: [phone] Fax: [phone]

## 2016-04-03 NOTE — Telephone Encounter (Signed)
[facility] and relayed results per Dr Lucia Gaskins note below. Dr Dione Booze stated his weight/hypertension caused extra blood vessels in eyes, which caused blurry vision. Dr Dione Booze gave him a prescription for stronger glasses to help with vision. Scheduled f/u appt again with Dr Dione Booze to come back and check on left eye to see if vision has improved. If not, he will have to come back to see Dr Lucia Gaskins per Dr Dione Booze. I told him to call if he has any further questions or concerns. He verbalized understanding.

## 2016-04-03 NOTE — Patient Instructions (Addendum)
Medication Instructions:  Your physician has recommended you make the following change in your medication:  1.  DO NOT START AMLODIPINE 2.  DO NOT START HYDRALAZINE 3.  STOP METOPROLOL 3.  START COREG 6.25 TAKING 1 TABLET TWICE A DAY 4.  START LOSARTAN 25 MG TAKING 1 TABLET DAILY  Labwork: None ordered  Testing/Procedures: Your physician has recommended that you have a sleep study. This test records several body functions during sleep, including: brain activity, eye movement, oxygen and carbon dioxide blood levels, heart rate and rhythm, breathing rate and rhythm, the flow of air through your mouth and nose, snoring, body muscle movements, and chest and belly movement.  Follow-Up: Your physician recommends that you schedule a follow-up appointment in: 2 WEEKS WITH 1ST AVAILABLE EXTENDER   Any Other Special Instructions Will Be Listed Below (If Applicable).  Sleep Studies A sleep study (polysomnogram) is a series of tests done while you are sleeping. It can show how well you sleep. This can help your health care provider diagnose a sleep disorder and show how severe your sleep disorder is. A sleep study may lead to treatment that will help you sleep better and prevent other medical problems caused by poor sleep. If you have a sleep disorder, you may also be at risk for:   Sleep-related accidents.  High blood pressure.  Heart disease.  Stroke.  Other medical conditions. Sleep disorders are common. Your health care provider may suspect a sleep disorder if you:  Have loud snoring most nights.  Have brief periods when you stop breathing at night.  Feel sleepy on most days.  Fall asleep suddenly during the day.  Have trouble falling asleep or staying asleep.  Feel like you need to move your legs when trying to fall asleep.  Have dreams that seem very real shortly after falling asleep.  Feel like you cannot move when you first wake up. WHICH TESTS WILL I NEED TO HAVE?   Most sleep studies last all night and include these tests:  Recordings of your brain activity.  Recordings of your eye movements.  Recording of your heart rate and rhythm.  Blood pressure readings.  Readings of the amount of oxygen in your blood.  Measurements of your chest and belly movement as you breathe during sleep. If you have signs of the sleep disorder called sleep apnea during your test, you may get a mask to wear for the second half of the night.   The mask provides continuous positive airway pressure (CPAP). This may improve sleep apnea significantly.  You will then have all tests done again with the mask in place to see if your measurements and recordings change. HOW ARE SLEEP STUDIES DONE? Most sleep studies are done over one full night of sleep.   You will arrive at the study center in the evening and can go home in the morning.  Bring your pajamas and toothbrush.  Do not have caffeine on the day of your sleep study.  Your health care provider will let you know if you need to stop taking any of your regular medicines before the test. To do the tests included in a polysomnogram, you will have:  Round, sticky patches with sensors attached to recording wires (electrodes) placed on your scalp, face, chest, and limbs.  Wires from all the electrodes and sensors run from your bed to a computer. The wires can be taken off and put back on if you need to get out of bed to go  to the bathroom.  A sensor placed over your nose to measure airflow.  A finger clip put on one finger to measure your blood oxygen level.  A belt around your belly and a belt around your chest to measure breathing movements. WHERE ARE SLEEP STUDIES DONE?  Sleep studies are done at sleep centers. A sleep center may be inside a hospital, office, or clinic.  The room where you have the study may look like a hospital room or a hotel room. The health care providers doing the study may come in and out of  the room during the study. Most of the time, they will be in another room monitoring your test.  HOW IS INFORMATION FROM SLEEP STUDIES HELPFUL? A polysomnogram can be used along with your medical history and a physical exam to diagnose conditions, such as:  Sleep apnea.  Restless legs syndrome.  Sleep-related seizure disorders.  Sleep-related movement disorders. A medical doctor who specializes in sleep will evaluate your sleep study. The specialist will share the results with your primary health care provider. Treatments based on your sleep study may include:  Improving your sleep habits (sleep hygiene).  Wearing a CPAP mask.  Wearing an oral device at night to improve breathing and reduce snoring.  Taking medicine for:  Restless legs syndrome.  Sleep-related seizure disorder.  Sleep-related movement disorder.   This information is not intended to replace advice given to you by your health care provider. Make sure you discuss any questions you have with your health care provider.   Document Released: 04/26/2003 Document Revised: 11/10/2014 Document Reviewed: 12/26/2013 Elsevier Interactive Patient Education [employer]       If you need a refill on your cardiac medications before your next appointment, please call your pharmacy.

## 2016-04-03 NOTE — Telephone Encounter (Signed)
-----   Message from Anson Fret, MD sent at 04/03/2016  7:41 AM EDT ----- Sed rate was a little elevated but not enough to really worry about (ie temporal arteritis). I will review records from Dr. Dione Booze from his appt yesterday thanks!

## 2016-04-04 ENCOUNTER — Telehealth: Payer: Self-pay | Admitting: *Deleted

## 2016-04-04 MED ORDER — LOSARTAN POTASSIUM 25 MG PO TABS: 25.0000 mg | ORAL_TABLET | Freq: Every day | ORAL | Status: DC

## 2016-04-04 NOTE — Addendum Note (Signed)
Addended by: Burnetta Sabin on: 04/04/2016 11:26 AM   Modules accepted: Orders

## 2016-04-04 NOTE — Telephone Encounter (Signed)
Per Patient's insurance, he does not meet the criteria for approval for a lab sleep study.     Insurance might possibly cover a home sleep study.   Would you like to cancel or try home sleep test?

## 2016-04-07 NOTE — Telephone Encounter (Signed)
Checked with Dr. Clifton James, he wants to see if we can do home sleep study. Thanks

## 2016-04-07 NOTE — Telephone Encounter (Signed)
We can try a home sleep study. Thayer Ohm

## 2016-04-07 NOTE — Telephone Encounter (Signed)
Would you like to try home sleep test or do you wish to decide on followup with you. It seems he does meet criteria for approval for lab sleep study, but insurance may cover home sleep study. Morbidly obese, does snore.

## 2016-04-11 NOTE — Telephone Encounter (Signed)
Home sleep study will be set up.

## 2016-04-14 NOTE — Telephone Encounter (Signed)
Information has been sent to [facility].   Attempted to contact patient multiple times, with no answer  - memory full and could not leave voicemail.

## 2016-04-18 ENCOUNTER — Ambulatory Visit: Payer: BLUE CROSS/BLUE SHIELD | Admitting: Physician Assistant

## 2016-05-04 ENCOUNTER — Encounter: Payer: Self-pay | Admitting: Physician Assistant

## 2016-05-16 ENCOUNTER — Telehealth: Payer: Self-pay | Admitting: Cardiovascular Disease

## 2016-05-16 NOTE — Telephone Encounter (Signed)
Attempted to call patient.   Notes are in my results folder and I will be documenting there.

## 2016-05-16 NOTE — Telephone Encounter (Signed)
Follow-up ° ° ° ° °The pt is returning the nurses call °

## 2016-05-16 NOTE — Telephone Encounter (Signed)
It looks like Toma Copier has been trying to contact pt regarding sleep study

## 2016-05-23 ENCOUNTER — Telehealth: Payer: Self-pay | Admitting: *Deleted

## 2016-05-23 ENCOUNTER — Encounter: Payer: Self-pay | Admitting: *Deleted

## 2016-05-23 NOTE — Telephone Encounter (Signed)
I have tried to contact patient multiple times for sleep study results. There has been no success with trying to reach him -  It appears that children answer the phone but tell me he is not there and hang up the phone.  I have sent patient a letter asking that he call our office for results.   Results for sleep study are in my Results basket.

## 2016-05-29 ENCOUNTER — Telehealth: Payer: Self-pay | Admitting: *Deleted

## 2016-05-29 NOTE — Telephone Encounter (Signed)
New MEssage  Pt received letter to call back for sleep results. Please call back and discuss.

## 2016-05-29 NOTE — Telephone Encounter (Signed)
Attempted to call pt , no answer service available,

## 2016-06-02 ENCOUNTER — Telehealth: Payer: Self-pay | Admitting: Neurology

## 2016-06-02 NOTE — Telephone Encounter (Signed)
Called pt to r/s Carotid study and he advised that he can only due Saturday, Sunday or Tuesday because of his work schedule.  Can this order be sent to the hospital to perform?

## 2016-06-02 NOTE — Telephone Encounter (Signed)
Hi Carollee Herter can you call the patient and reschedule. Thanks Annabelle Harman

## 2016-06-02 NOTE — Telephone Encounter (Signed)
Patient called to cancel his 8/2 carotid artery test because of work conflict but would like to r/s.  Please call.

## 2016-06-03 NOTE — Telephone Encounter (Signed)
Hey Luis Herter Do you think you can get him in On a Tuesday so we can keep him there ? Let me No ?

## 2016-06-03 NOTE — Telephone Encounter (Signed)
We only do them on Wednesday's and Thursday's.  It is set up with a tech that is contracted to come twice a week.  It will not be possible to do it here.

## 2016-06-04 ENCOUNTER — Other Ambulatory Visit: Payer: BLUE CROSS/BLUE SHIELD

## 2016-06-04 NOTE — Telephone Encounter (Signed)
I spoke to Patient he is going to speak to his Louann Sjogren next week and see if he can leave work early. He will call the office back when he is ready to schedule . Patient does not want to have done at hospital. Thanks Annabelle Harman

## 2016-06-10 NOTE — Telephone Encounter (Signed)
Attempted to call.. Child answered the phone and said that he cant come to the phone.

## 2016-06-17 NOTE — Telephone Encounter (Signed)
Have tried, unsuccessfully, to reach patient for over a month.  I have mailed a letter, and tried phone calls again and still can't reach him.  If patient calls back, he needs the results of his sleep study.  Message that I can't reach him has also been sent back to the provider.

## 2016-07-01 ENCOUNTER — Ambulatory Visit: Payer: BLUE CROSS/BLUE SHIELD | Admitting: Cardiology

## 2016-07-01 NOTE — Progress Notes (Deleted)
Cardiology Office Note   Date:  07/01/2016   ID:  Luis [name], DOB 29-May-1966, MRN [id number]  PCP:  Jearld Leschwight M Williams, MD  Cardiologist:  ***    No chief complaint on file.     History of Present Illness: Luis [name] is a 50 y.o. male who presents for ***    Past Medical History:  Diagnosis Date  . Essential hypertension   . High cholesterol   . History of tobacco abuse   . Morbid obesity (HCC)   . Pneumonia ~ 12/2015; 03/19/2016    Past Surgical History:  Procedure Laterality Date  . CARDIAC CATHETERIZATION N/A 03/24/2016   Procedure: Left Heart Cath and Coronary Angiography;  Surgeon: Marykay Lexavid W Harding, MD;  [facility];  Service: Cardiovascular;  Laterality: N/A;  . FEMUR IM NAIL Right 2012  . FRACTURE SURGERY       Current Outpatient Prescriptions  Medication Sig Dispense Refill  . albuterol (PROVENTIL HFA;VENTOLIN HFA) 108 (90 BASE) MCG/ACT inhaler Inhale 2 puffs into the lungs every 4 (four) hours as needed for wheezing. 1 Inhaler 0  . aspirin EC 81 MG EC tablet Take 1 tablet (81 mg total) by mouth daily. 30 tablet 0  . carvedilol (COREG) 6.25 MG tablet Take 1 tablet (6.25 mg total) by mouth 2 (two) times daily with a meal. 180 tablet 3  . chlorpheniramine-HYDROcodone (TUSSIONEX) 10-8 MG/5ML SUER Take 5 mLs by mouth every 12 (twelve) hours as needed for cough. 140 mL 0  . fluticasone (FLONASE) 50 MCG/ACT nasal spray Place 2 sprays into both nostrils daily. 16 g 3  . guaiFENesin (MUCINEX) 600 MG 12 hr tablet Take 1 tablet (600 mg total) by mouth 2 (two) times daily. 60 tablet 0  . hydrALAZINE (APRESOLINE) 25 MG tablet Take 1 tablet (25 mg total) by mouth every 8 (eight) hours. 90 tablet 1  . levalbuterol (XOPENEX) 1.25 MG/0.5ML nebulizer solution Take 1.25 mg by nebulization every 4 (four) hours as needed for wheezing or shortness of breath. 500 mL 5  . losartan (COZAAR) 25 MG tablet Take 1 tablet (25 mg total) by mouth daily. 90 tablet 3     No current facility-administered medications for this visit.     Allergies:   Morphine and related    Social History:  The patient  reports that he quit smoking about 15 months ago. His smoking use included Cigarettes. He has a 15.00 pack-year smoking history. He has never used smokeless tobacco. He reports that he does not drink alcohol or use drugs.   Family History:  The patient's ***family history includes Diabetes type II in his mother and other.    ROS:  General:no colds or fevers, no weight changes Skin:no rashes or ulcers HEENT:no blurred vision, no congestion CV:see HPI PUL:see HPI GI:no diarrhea constipation or melena, no indigestion GU:no hematuria, no dysuria MS:no joint pain, no claudication Neuro:no syncope, no lightheadedness Endo:no diabetes, no thyroid disease Wt Readings from Last 3 Encounters:  04/03/16 (!) 349 lb 3.2 oz (158.4 kg)  04/02/16 (!) 350 lb 3.2 oz (158.8 kg)  03/26/16 (!) 333 lb 11.2 oz (151.4 kg)     PHYSICAL EXAM: VS:  There were no vitals taken for this visit. , BMI There is no height or weight on file to calculate BMI. General:Pleasant affect, NAD Skin:Warm and dry, brisk capillary refill HEENT:normocephalic, sclera clear, mucus membranes moist Neck:supple, no JVD, no bruits  Heart:S1S2 RRR without murmur, gallup, rub or click Lungs:clear  without rales, rhonchi, or wheezes SPQ:ZRAQ, non tender, + BS, do not palpate liver spleen or masses Ext:no lower ext edema, 2+ pedal pulses, 2+ radial pulses Neuro:alert and oriented, MAE, follows commands, + facial symmetry    EKG:  EKG is ordered today. The ekg ordered today demonstrates ***   Recent Labs: 03/19/2016: TSH 0.777 03/20/2016: ALT 19; Magnesium 2.0 03/21/2016: B Natriuretic Peptide 97.6 03/26/2016: BUN 18; Creatinine, Ser 1.20; Hemoglobin 14.4; Platelets 303; Potassium 3.8; Sodium 137    Lipid Panel    Component Value Date/Time   CHOL 164 03/24/2016 1108   TRIG 193 (H)  03/24/2016 1108   HDL 23 (L) 03/24/2016 1108   CHOLHDL 7.1 03/24/2016 1108   VLDL 39 03/24/2016 1108   LDLCALC 102 (H) 03/24/2016 1108       Other studies Reviewed: Additional studies/ records that were reviewed today include: ***.   ASSESSMENT AND PLAN:  1.  ***   Current medicines are reviewed with the patient today.  The patient Has no concerns regarding medicines.  The following changes have been made:  See above Labs/ tests ordered today include:see above  Disposition:   FU:  see above  Signed, Nada Boozer, NP  07/01/2016 9:44 AM    [facility] [address] [place], [place], [place]  [address]/ [address] [place], [place] Phone: [phone]; Fax: [phone]  [phone]

## 2016-07-10 ENCOUNTER — Encounter: Payer: Self-pay | Admitting: Cardiology

## 2017-03-01 ENCOUNTER — Emergency Department (HOSPITAL_COMMUNITY): Payer: BLUE CROSS/BLUE SHIELD

## 2017-03-01 ENCOUNTER — Inpatient Hospital Stay (HOSPITAL_COMMUNITY)
Admission: EM | Admit: 2017-03-01 | Discharge: 2017-03-17 | DRG: 870 | Disposition: A | Discharge status: Rehabilitation Facility | Payer: BLUE CROSS/BLUE SHIELD | Attending: Family Medicine | Admitting: Family Medicine

## 2017-03-01 ENCOUNTER — Encounter: Payer: Self-pay | Admitting: Internal Medicine

## 2017-03-01 ENCOUNTER — Encounter (HOSPITAL_COMMUNITY): Payer: Self-pay | Admitting: Emergency Medicine

## 2017-03-01 DIAGNOSIS — J441 Chronic obstructive pulmonary disease with (acute) exacerbation: Secondary | ICD-10-CM | POA: Diagnosis not present

## 2017-03-01 DIAGNOSIS — I10 Essential (primary) hypertension: Secondary | ICD-10-CM | POA: Diagnosis not present

## 2017-03-01 DIAGNOSIS — M7989 Other specified soft tissue disorders: Secondary | ICD-10-CM | POA: Diagnosis not present

## 2017-03-01 DIAGNOSIS — I110 Hypertensive heart disease with heart failure: Secondary | ICD-10-CM | POA: Diagnosis present

## 2017-03-01 DIAGNOSIS — R0682 Tachypnea, not elsewhere classified: Secondary | ICD-10-CM

## 2017-03-01 DIAGNOSIS — T17990A Other foreign object in respiratory tract, part unspecified in causing asphyxiation, initial encounter: Secondary | ICD-10-CM | POA: Diagnosis not present

## 2017-03-01 DIAGNOSIS — J90 Pleural effusion, not elsewhere classified: Secondary | ICD-10-CM

## 2017-03-01 DIAGNOSIS — T502X5A Adverse effect of carbonic-anhydrase inhibitors, benzothiadiazides and other diuretics, initial encounter: Secondary | ICD-10-CM | POA: Diagnosis not present

## 2017-03-01 DIAGNOSIS — E785 Hyperlipidemia, unspecified: Secondary | ICD-10-CM | POA: Diagnosis present

## 2017-03-01 DIAGNOSIS — Z978 Presence of other specified devices: Secondary | ICD-10-CM

## 2017-03-01 DIAGNOSIS — I5041 Acute combined systolic (congestive) and diastolic (congestive) heart failure: Secondary | ICD-10-CM | POA: Diagnosis not present

## 2017-03-01 DIAGNOSIS — Z720 Tobacco use: Secondary | ICD-10-CM | POA: Diagnosis not present

## 2017-03-01 DIAGNOSIS — R000 Tachycardia, unspecified: Secondary | ICD-10-CM | POA: Diagnosis not present

## 2017-03-01 DIAGNOSIS — J811 Chronic pulmonary edema: Secondary | ICD-10-CM | POA: Diagnosis not present

## 2017-03-01 DIAGNOSIS — R140 Abdominal distension (gaseous): Secondary | ICD-10-CM

## 2017-03-01 DIAGNOSIS — R6521 Severe sepsis with septic shock: Secondary | ICD-10-CM | POA: Diagnosis not present

## 2017-03-01 DIAGNOSIS — G4733 Obstructive sleep apnea (adult) (pediatric): Secondary | ICD-10-CM | POA: Diagnosis present

## 2017-03-01 DIAGNOSIS — J80 Acute respiratory distress syndrome: Secondary | ICD-10-CM | POA: Diagnosis not present

## 2017-03-01 DIAGNOSIS — K567 Ileus, unspecified: Secondary | ICD-10-CM | POA: Diagnosis not present

## 2017-03-01 DIAGNOSIS — R0602 Shortness of breath: Secondary | ICD-10-CM

## 2017-03-01 DIAGNOSIS — J440 Chronic obstructive pulmonary disease with acute lower respiratory infection: Secondary | ICD-10-CM | POA: Diagnosis not present

## 2017-03-01 DIAGNOSIS — I509 Heart failure, unspecified: Secondary | ICD-10-CM | POA: Diagnosis not present

## 2017-03-01 DIAGNOSIS — G92 Toxic encephalopathy: Secondary | ICD-10-CM | POA: Diagnosis not present

## 2017-03-01 DIAGNOSIS — A419 Sepsis, unspecified organism: Secondary | ICD-10-CM | POA: Diagnosis not present

## 2017-03-01 DIAGNOSIS — R1011 Right upper quadrant pain: Secondary | ICD-10-CM | POA: Diagnosis not present

## 2017-03-01 DIAGNOSIS — I5043 Acute on chronic combined systolic (congestive) and diastolic (congestive) heart failure: Secondary | ICD-10-CM | POA: Diagnosis present

## 2017-03-01 DIAGNOSIS — Z9114 Patient's other noncompliance with medication regimen: Secondary | ICD-10-CM

## 2017-03-01 DIAGNOSIS — E876 Hypokalemia: Secondary | ICD-10-CM | POA: Diagnosis not present

## 2017-03-01 DIAGNOSIS — I5023 Acute on chronic systolic (congestive) heart failure: Secondary | ICD-10-CM

## 2017-03-01 DIAGNOSIS — N179 Acute kidney failure, unspecified: Secondary | ICD-10-CM | POA: Diagnosis not present

## 2017-03-01 DIAGNOSIS — J9601 Acute respiratory failure with hypoxia: Secondary | ICD-10-CM | POA: Diagnosis not present

## 2017-03-01 DIAGNOSIS — J9690 Respiratory failure, unspecified, unspecified whether with hypoxia or hypercapnia: Secondary | ICD-10-CM | POA: Diagnosis not present

## 2017-03-01 DIAGNOSIS — I5042 Chronic combined systolic (congestive) and diastolic (congestive) heart failure: Secondary | ICD-10-CM | POA: Diagnosis not present

## 2017-03-01 DIAGNOSIS — E1169 Type 2 diabetes mellitus with other specified complication: Secondary | ICD-10-CM | POA: Diagnosis not present

## 2017-03-01 DIAGNOSIS — Z781 Physical restraint status: Secondary | ICD-10-CM

## 2017-03-01 DIAGNOSIS — J9811 Atelectasis: Secondary | ICD-10-CM | POA: Diagnosis not present

## 2017-03-01 DIAGNOSIS — R918 Other nonspecific abnormal finding of lung field: Secondary | ICD-10-CM | POA: Diagnosis not present

## 2017-03-01 DIAGNOSIS — J989 Respiratory disorder, unspecified: Secondary | ICD-10-CM | POA: Diagnosis not present

## 2017-03-01 DIAGNOSIS — Y95 Nosocomial condition: Secondary | ICD-10-CM | POA: Diagnosis not present

## 2017-03-01 DIAGNOSIS — E870 Hyperosmolality and hypernatremia: Secondary | ICD-10-CM | POA: Diagnosis not present

## 2017-03-01 DIAGNOSIS — J9620 Acute and chronic respiratory failure, unspecified whether with hypoxia or hypercapnia: Secondary | ICD-10-CM | POA: Diagnosis not present

## 2017-03-01 DIAGNOSIS — Z452 Encounter for adjustment and management of vascular access device: Secondary | ICD-10-CM

## 2017-03-01 DIAGNOSIS — I5032 Chronic diastolic (congestive) heart failure: Secondary | ICD-10-CM | POA: Diagnosis not present

## 2017-03-01 DIAGNOSIS — R1031 Right lower quadrant pain: Secondary | ICD-10-CM | POA: Diagnosis not present

## 2017-03-01 DIAGNOSIS — J9600 Acute respiratory failure, unspecified whether with hypoxia or hypercapnia: Secondary | ICD-10-CM

## 2017-03-01 DIAGNOSIS — E872 Acidosis: Secondary | ICD-10-CM | POA: Diagnosis not present

## 2017-03-01 DIAGNOSIS — J95851 Ventilator associated pneumonia: Secondary | ICD-10-CM

## 2017-03-01 DIAGNOSIS — J189 Pneumonia, unspecified organism: Secondary | ICD-10-CM

## 2017-03-01 DIAGNOSIS — M791 Myalgia: Secondary | ICD-10-CM | POA: Diagnosis not present

## 2017-03-01 DIAGNOSIS — I428 Other cardiomyopathies: Secondary | ICD-10-CM | POA: Diagnosis not present

## 2017-03-01 DIAGNOSIS — Z87891 Personal history of nicotine dependence: Secondary | ICD-10-CM

## 2017-03-01 DIAGNOSIS — X58XXXA Exposure to other specified factors, initial encounter: Secondary | ICD-10-CM | POA: Diagnosis present

## 2017-03-01 DIAGNOSIS — G9340 Encephalopathy, unspecified: Secondary | ICD-10-CM | POA: Diagnosis not present

## 2017-03-01 DIAGNOSIS — E1142 Type 2 diabetes mellitus with diabetic polyneuropathy: Secondary | ICD-10-CM | POA: Diagnosis present

## 2017-03-01 DIAGNOSIS — J1000 Influenza due to other identified influenza virus with unspecified type of pneumonia: Secondary | ICD-10-CM | POA: Diagnosis present

## 2017-03-01 DIAGNOSIS — Y848 Other medical procedures as the cause of abnormal reaction of the patient, or of later complication, without mention of misadventure at the time of the procedure: Secondary | ICD-10-CM | POA: Diagnosis not present

## 2017-03-01 DIAGNOSIS — Z6841 Body Mass Index (BMI) 40.0 and over, adult: Secondary | ICD-10-CM

## 2017-03-01 DIAGNOSIS — Z9119 Patient's noncompliance with other medical treatment and regimen: Secondary | ICD-10-CM | POA: Diagnosis not present

## 2017-03-01 DIAGNOSIS — Z91199 Patient's noncompliance with other medical treatment and regimen due to unspecified reason: Secondary | ICD-10-CM

## 2017-03-01 DIAGNOSIS — J810 Acute pulmonary edema: Secondary | ICD-10-CM

## 2017-03-01 DIAGNOSIS — Z4659 Encounter for fitting and adjustment of other gastrointestinal appliance and device: Secondary | ICD-10-CM

## 2017-03-01 DIAGNOSIS — R400 Somnolence: Secondary | ICD-10-CM | POA: Diagnosis not present

## 2017-03-01 DIAGNOSIS — J101 Influenza due to other identified influenza virus with other respiratory manifestations: Secondary | ICD-10-CM

## 2017-03-01 DIAGNOSIS — R451 Restlessness and agitation: Secondary | ICD-10-CM | POA: Diagnosis not present

## 2017-03-01 DIAGNOSIS — R5381 Other malaise: Secondary | ICD-10-CM | POA: Diagnosis not present

## 2017-03-01 DIAGNOSIS — J449 Chronic obstructive pulmonary disease, unspecified: Secondary | ICD-10-CM | POA: Diagnosis not present

## 2017-03-01 DIAGNOSIS — Z66 Do not resuscitate: Secondary | ICD-10-CM | POA: Diagnosis present

## 2017-03-01 DIAGNOSIS — E871 Hypo-osmolality and hyponatremia: Secondary | ICD-10-CM | POA: Diagnosis not present

## 2017-03-01 DIAGNOSIS — F17210 Nicotine dependence, cigarettes, uncomplicated: Secondary | ICD-10-CM | POA: Diagnosis present

## 2017-03-01 DIAGNOSIS — J9602 Acute respiratory failure with hypercapnia: Secondary | ICD-10-CM | POA: Diagnosis not present

## 2017-03-01 DIAGNOSIS — Z885 Allergy status to narcotic agent status: Secondary | ICD-10-CM | POA: Diagnosis not present

## 2017-03-01 DIAGNOSIS — I248 Other forms of acute ischemic heart disease: Secondary | ICD-10-CM | POA: Diagnosis present

## 2017-03-01 DIAGNOSIS — I5030 Unspecified diastolic (congestive) heart failure: Secondary | ICD-10-CM | POA: Diagnosis not present

## 2017-03-01 DIAGNOSIS — R0902 Hypoxemia: Secondary | ICD-10-CM | POA: Diagnosis not present

## 2017-03-01 DIAGNOSIS — I480 Paroxysmal atrial fibrillation: Secondary | ICD-10-CM | POA: Diagnosis present

## 2017-03-01 DIAGNOSIS — F419 Anxiety disorder, unspecified: Secondary | ICD-10-CM | POA: Diagnosis not present

## 2017-03-01 DIAGNOSIS — Z4682 Encounter for fitting and adjustment of non-vascular catheter: Secondary | ICD-10-CM | POA: Diagnosis not present

## 2017-03-01 DIAGNOSIS — I5033 Acute on chronic diastolic (congestive) heart failure: Secondary | ICD-10-CM | POA: Diagnosis not present

## 2017-03-01 HISTORY — DX: Type 2 diabetes mellitus without complications: E11.9

## 2017-03-01 HISTORY — DX: Essential (primary) hypertension: I10

## 2017-03-01 HISTORY — DX: Hyperlipidemia, unspecified: E78.5

## 2017-03-01 HISTORY — DX: Obstructive sleep apnea (adult) (pediatric): G47.33

## 2017-03-01 LAB — I-STAT ARTERIAL BLOOD GAS, ED
ACID-BASE DEFICIT: 1 mmol/L (ref 0.0–2.0)
BICARBONATE: 26.2 mmol/L (ref 20.0–28.0)
O2 Saturation: 100 %
PH ART: 7.298 — AB (ref 7.350–7.450)
TCO2: 28 mmol/L (ref 0–100)
pCO2 arterial: 53.4 mmHg — ABNORMAL HIGH (ref 32.0–48.0)
pO2, Arterial: 312 mmHg — ABNORMAL HIGH (ref 83.0–108.0)

## 2017-03-01 LAB — URINALYSIS, ROUTINE W REFLEX MICROSCOPIC
Bilirubin Urine: NEGATIVE
Glucose, UA: NEGATIVE mg/dL
Ketones, ur: NEGATIVE mg/dL
Leukocytes, UA: NEGATIVE
NITRITE: NEGATIVE
Specific Gravity, Urine: 1.025 (ref 1.005–1.030)
pH: 6.5 (ref 5.0–8.0)

## 2017-03-01 LAB — URINALYSIS, MICROSCOPIC (REFLEX)

## 2017-03-01 LAB — I-STAT CHEM 8, ED
BUN: 12 mg/dL (ref 6–20)
Calcium, Ion: 1.09 mmol/L — ABNORMAL LOW (ref 1.15–1.40)
Chloride: 103 mmol/L (ref 101–111)
Creatinine, Ser: 1 mg/dL (ref 0.61–1.24)
Glucose, Bld: 132 mg/dL — ABNORMAL HIGH (ref 65–99)
HEMATOCRIT: 49 % (ref 39.0–52.0)
HEMOGLOBIN: 16.7 g/dL (ref 13.0–17.0)
POTASSIUM: 4.4 mmol/L (ref 3.5–5.1)
SODIUM: 138 mmol/L (ref 135–145)
TCO2: 27 mmol/L (ref 0–100)

## 2017-03-01 LAB — CBC WITH DIFFERENTIAL/PLATELET
BASOS PCT: 0 %
Basophils Absolute: 0 10*3/uL (ref 0.0–0.1)
EOS ABS: 0.1 10*3/uL (ref 0.0–0.7)
EOS PCT: 2 %
HCT: 47.7 % (ref 39.0–52.0)
HEMOGLOBIN: 15.3 g/dL (ref 13.0–17.0)
LYMPHS ABS: 2 10*3/uL (ref 0.7–4.0)
Lymphocytes Relative: 21 %
MCH: 31 pg (ref 26.0–34.0)
MCHC: 32.1 g/dL (ref 30.0–36.0)
MCV: 96.6 fL (ref 78.0–100.0)
MONO ABS: 0.7 10*3/uL (ref 0.1–1.0)
MONOS PCT: 7 %
Neutro Abs: 6.6 10*3/uL (ref 1.7–7.7)
Neutrophils Relative %: 70 %
Platelets: 260 10*3/uL (ref 150–400)
RBC: 4.94 MIL/uL (ref 4.22–5.81)
RDW: 14.7 % (ref 11.5–15.5)
WBC: 9.3 10*3/uL (ref 4.0–10.5)

## 2017-03-01 LAB — RAPID URINE DRUG SCREEN, HOSP PERFORMED
Amphetamines: NOT DETECTED
BARBITURATES: NOT DETECTED
Benzodiazepines: NOT DETECTED
Cocaine: NOT DETECTED
Opiates: NOT DETECTED
Tetrahydrocannabinol: NOT DETECTED

## 2017-03-01 LAB — MAGNESIUM: MAGNESIUM: 2.1 mg/dL (ref 1.7–2.4)

## 2017-03-01 LAB — COMPREHENSIVE METABOLIC PANEL
ALBUMIN: 3.6 g/dL (ref 3.5–5.0)
ALK PHOS: 148 U/L — AB (ref 38–126)
ALT: 15 U/L — ABNORMAL LOW (ref 17–63)
ANION GAP: 9 (ref 5–15)
AST: 26 U/L (ref 15–41)
BUN: 10 mg/dL (ref 6–20)
CALCIUM: 8.1 mg/dL — AB (ref 8.9–10.3)
CO2: 24 mmol/L (ref 22–32)
Chloride: 99 mmol/L — ABNORMAL LOW (ref 101–111)
Creatinine, Ser: 1.1 mg/dL (ref 0.61–1.24)
GFR calc non Af Amer: >60 mL/min (ref >60)
GLUCOSE: 133 mg/dL — AB (ref 65–99)
POTASSIUM: 4.2 mmol/L (ref 3.5–5.1)
SODIUM: 132 mmol/L — AB (ref 135–145)
TOTAL PROTEIN: 7.9 g/dL (ref 6.5–8.1)
Total Bilirubin: 0.6 mg/dL (ref 0.3–1.2)

## 2017-03-01 LAB — I-STAT TROPONIN, ED: Troponin i, poc: 0.03 ng/mL (ref 0.00–0.08)

## 2017-03-01 LAB — PROCALCITONIN: Procalcitonin: 1.61 ng/mL

## 2017-03-01 LAB — TROPONIN I: Troponin I: 0.19 ng/mL (ref <0.03)

## 2017-03-01 LAB — TRIGLYCERIDES: TRIGLYCERIDES: 75 mg/dL (ref <150)

## 2017-03-01 LAB — BRAIN NATRIURETIC PEPTIDE: B Natriuretic Peptide: 243.5 pg/mL — ABNORMAL HIGH (ref 0.0–100.0)

## 2017-03-01 MED ORDER — FENTANYL CITRATE (PF) 100 MCG/2ML IJ SOLN
100.0000 ug | INTRAMUSCULAR | Status: DC | PRN
Start: 2017-03-01 — End: 2017-03-01

## 2017-03-01 MED ORDER — FUROSEMIDE 10 MG/ML IJ SOLN
80.0000 mg | Freq: Three times a day (TID) | INTRAMUSCULAR | Status: DC
  Administered 2017-03-02 (×2): 80 mg via INTRAVENOUS
  Filled 2017-03-01 (×2): qty 8

## 2017-03-01 MED ORDER — MIDAZOLAM HCL 5 MG/5ML IJ SOLN: 2.0000 mg | INTRAMUSCULAR | Status: DC | PRN

## 2017-03-01 MED ORDER — CHLORHEXIDINE GLUCONATE 0.12% ORAL RINSE (MEDLINE KIT)
15.0000 mL | Freq: Two times a day (BID) | OROMUCOSAL | Status: DC
  Administered 2017-03-01 – 2017-03-12 (×21): 15 mL via OROMUCOSAL

## 2017-03-01 MED ORDER — FENTANYL CITRATE (PF) 100 MCG/2ML IJ SOLN: 50.0000 ug | Freq: Once | INTRAMUSCULAR | Status: DC

## 2017-03-01 MED ORDER — ETOMIDATE 2 MG/ML IV SOLN
20.0000 mg | Freq: Once | INTRAVENOUS | Status: AC
  Administered 2017-03-01: 20 mg via INTRAVENOUS

## 2017-03-01 MED ORDER — FENTANYL 2500MCG IN NS 250ML (10MCG/ML) PREMIX INFUSION
25.0000 ug/h | INTRAVENOUS | Status: DC
  Administered 2017-03-01: 50 ug/h via INTRAVENOUS
  Administered 2017-03-02: 375 ug/h via INTRAVENOUS
  Administered 2017-03-02: 300 ug/h via INTRAVENOUS
  Administered 2017-03-02: 400 ug/h via INTRAVENOUS
  Administered 2017-03-02 (×2): 350 ug/h via INTRAVENOUS
  Administered 2017-03-03 (×2): 400 ug/h via INTRAVENOUS
  Administered 2017-03-03: 300 ug/h via INTRAVENOUS
  Administered 2017-03-04 (×5): 400 ug/h via INTRAVENOUS
  Administered 2017-03-05: 100 ug/h via INTRAVENOUS
  Administered 2017-03-05: 400 ug/h via INTRAVENOUS
  Administered 2017-03-06: 175 ug/h via INTRAVENOUS
  Administered 2017-03-06: 200 ug/h via INTRAVENOUS
  Administered 2017-03-07: 150 ug/h via INTRAVENOUS
  Administered 2017-03-07: 300 ug/h via INTRAVENOUS
  Administered 2017-03-07: 250 ug/h via INTRAVENOUS
  Administered 2017-03-07 – 2017-03-08 (×3): 400 ug/h via INTRAVENOUS
  Administered 2017-03-08: 350 ug/h via INTRAVENOUS
  Administered 2017-03-09: 400 ug/h via INTRAVENOUS
  Administered 2017-03-09: 350 ug/h via INTRAVENOUS
  Administered 2017-03-09 – 2017-03-11 (×6): 400 ug/h via INTRAVENOUS
  Administered 2017-03-11: 200 ug/h via INTRAVENOUS
  Administered 2017-03-11: 300 ug/h via INTRAVENOUS
  Administered 2017-03-11: 400 ug/h via INTRAVENOUS
  Administered 2017-03-12: 200 ug/h via INTRAVENOUS
  Filled 2017-03-01 (×36): qty 250

## 2017-03-01 MED ORDER — FUROSEMIDE 10 MG/ML IJ SOLN
40.0000 mg | Freq: Once | INTRAMUSCULAR | Status: AC
  Administered 2017-03-01: 40 mg via INTRAVENOUS
  Filled 2017-03-01: qty 4

## 2017-03-01 MED ORDER — SODIUM CHLORIDE 0.9 % IV SOLN: 250.0000 mL | INTRAVENOUS | Status: DC | PRN

## 2017-03-01 MED ORDER — SODIUM CHLORIDE 0.9% FLUSH: 3.0000 mL | INTRAVENOUS | Status: DC | PRN

## 2017-03-01 MED ORDER — INSULIN ASPART 100 UNIT/ML ~~LOC~~ SOLN
0.0000 [IU] | SUBCUTANEOUS | Status: DC
  Administered 2017-03-02 – 2017-03-03 (×4): 1 [IU] via SUBCUTANEOUS
  Administered 2017-03-03: 2 [IU] via SUBCUTANEOUS
  Administered 2017-03-03: 1 [IU] via SUBCUTANEOUS
  Administered 2017-03-03: 2 [IU] via SUBCUTANEOUS
  Administered 2017-03-03: 5 [IU] via SUBCUTANEOUS
  Administered 2017-03-04: 2 [IU] via SUBCUTANEOUS
  Administered 2017-03-04: 3 [IU] via SUBCUTANEOUS
  Administered 2017-03-04 (×3): 2 [IU] via SUBCUTANEOUS
  Administered 2017-03-04: 3 [IU] via SUBCUTANEOUS
  Administered 2017-03-05: 2 [IU] via SUBCUTANEOUS
  Administered 2017-03-05 (×3): 3 [IU] via SUBCUTANEOUS
  Administered 2017-03-05: 2 [IU] via SUBCUTANEOUS
  Administered 2017-03-05: 3 [IU] via SUBCUTANEOUS
  Administered 2017-03-05: 2 [IU] via SUBCUTANEOUS
  Administered 2017-03-06: 1 [IU] via SUBCUTANEOUS
  Administered 2017-03-06 (×3): 3 [IU] via SUBCUTANEOUS
  Administered 2017-03-06: 2 [IU] via SUBCUTANEOUS
  Administered 2017-03-07: 3 [IU] via SUBCUTANEOUS
  Administered 2017-03-07: 5 [IU] via SUBCUTANEOUS
  Administered 2017-03-07 (×3): 2 [IU] via SUBCUTANEOUS
  Administered 2017-03-07: 5 [IU] via SUBCUTANEOUS
  Administered 2017-03-08: 2 [IU] via SUBCUTANEOUS
  Administered 2017-03-08: 5 [IU] via SUBCUTANEOUS
  Administered 2017-03-08: 7 [IU] via SUBCUTANEOUS
  Administered 2017-03-08: 5 [IU] via SUBCUTANEOUS
  Administered 2017-03-08: 7 [IU] via SUBCUTANEOUS
  Administered 2017-03-08 – 2017-03-09 (×2): 3 [IU] via SUBCUTANEOUS
  Administered 2017-03-09 (×2): 2 [IU] via SUBCUTANEOUS
  Administered 2017-03-09: 3 [IU] via SUBCUTANEOUS
  Administered 2017-03-09: 1 [IU] via SUBCUTANEOUS
  Administered 2017-03-09 – 2017-03-10 (×2): 2 [IU] via SUBCUTANEOUS
  Administered 2017-03-10: 1 [IU] via SUBCUTANEOUS
  Administered 2017-03-10 (×4): 2 [IU] via SUBCUTANEOUS
  Administered 2017-03-11 (×2): 1 [IU] via SUBCUTANEOUS
  Administered 2017-03-11 (×3): 2 [IU] via SUBCUTANEOUS
  Administered 2017-03-11 – 2017-03-12 (×2): 1 [IU] via SUBCUTANEOUS
  Administered 2017-03-12: 2 [IU] via SUBCUTANEOUS
  Administered 2017-03-12: 1 [IU] via SUBCUTANEOUS
  Administered 2017-03-12 (×3): 2 [IU] via SUBCUTANEOUS
  Administered 2017-03-13 – 2017-03-14 (×5): 1 [IU] via SUBCUTANEOUS

## 2017-03-01 MED ORDER — PROPOFOL 10 MG/ML IV BOLUS
INTRAVENOUS | Status: AC
  Filled 2017-03-01: qty 20

## 2017-03-01 MED ORDER — PROPOFOL 1000 MG/100ML IV EMUL
0.0000 ug/kg/min | INTRAVENOUS | Status: DC
  Administered 2017-03-01: 20 ug/kg/min via INTRAVENOUS
  Administered 2017-03-01: 30 ug/kg/min via INTRAVENOUS
  Administered 2017-03-01: 20 ug/kg/min via INTRAVENOUS
  Filled 2017-03-01: qty 100

## 2017-03-01 MED ORDER — PHENYLEPHRINE 40 MCG/ML (10ML) SYRINGE FOR IV PUSH (FOR BLOOD PRESSURE SUPPORT)
PREFILLED_SYRINGE | INTRAVENOUS | Status: AC
  Administered 2017-03-01: 200 ug
  Filled 2017-03-01: qty 10

## 2017-03-01 MED ORDER — ENOXAPARIN SODIUM 40 MG/0.4ML ~~LOC~~ SOLN
40.0000 mg | SUBCUTANEOUS | Status: DC
  Administered 2017-03-02 (×2): 40 mg via SUBCUTANEOUS
  Filled 2017-03-01 (×3): qty 0.4

## 2017-03-01 MED ORDER — MIDAZOLAM HCL 5 MG/5ML IJ SOLN
4.0000 mg | Freq: Once | INTRAMUSCULAR | Status: AC
  Administered 2017-03-01: 4 mg via INTRAVENOUS

## 2017-03-01 MED ORDER — MIDAZOLAM HCL 2 MG/2ML IJ SOLN
2.0000 mg | INTRAMUSCULAR | Status: DC | PRN
  Administered 2017-03-02 (×3): 2 mg via INTRAVENOUS
  Filled 2017-03-01 (×5): qty 2

## 2017-03-01 MED ORDER — SUCCINYLCHOLINE CHLORIDE 20 MG/ML IJ SOLN
200.0000 mg | Freq: Once | INTRAMUSCULAR | Status: AC
  Administered 2017-03-01: 200 mg via INTRAVENOUS
  Filled 2017-03-01: qty 10

## 2017-03-01 MED ORDER — FENTANYL CITRATE (PF) 100 MCG/2ML IJ SOLN
100.0000 ug | INTRAMUSCULAR | Status: DC | PRN
  Administered 2017-03-01: 100 ug via INTRAVENOUS
  Filled 2017-03-01: qty 2

## 2017-03-01 MED ORDER — MIDAZOLAM HCL 2 MG/2ML IJ SOLN
INTRAMUSCULAR | Status: AC
  Filled 2017-03-01: qty 4

## 2017-03-01 MED ORDER — PROPOFOL 1000 MG/100ML IV EMUL
INTRAVENOUS | Status: AC
  Filled 2017-03-01: qty 100

## 2017-03-01 MED ORDER — ASPIRIN 300 MG RE SUPP
300.0000 mg | Freq: Once | RECTAL | Status: AC
  Administered 2017-03-01: 300 mg via RECTAL
  Filled 2017-03-01: qty 1

## 2017-03-01 MED ORDER — ORAL CARE MOUTH RINSE
15.0000 mL | Freq: Four times a day (QID) | OROMUCOSAL | Status: DC
  Administered 2017-03-02 – 2017-03-12 (×43): 15 mL via OROMUCOSAL

## 2017-03-01 MED ORDER — ACETAMINOPHEN 160 MG/5ML PO SOLN
650.0000 mg | Freq: Four times a day (QID) | ORAL | Status: DC | PRN
  Administered 2017-03-02 – 2017-03-09 (×8): 650 mg
  Filled 2017-03-01 (×8): qty 20.3

## 2017-03-01 MED ORDER — FENTANYL BOLUS VIA INFUSION
50.0000 ug | INTRAVENOUS | Status: DC | PRN
  Administered 2017-03-01 – 2017-03-08 (×10): 50 ug via INTRAVENOUS
  Administered 2017-03-08: 150 ug via INTRAVENOUS
  Administered 2017-03-08 – 2017-03-10 (×5): 50 ug via INTRAVENOUS
  Filled 2017-03-01: qty 50

## 2017-03-01 MED ORDER — DEXTROSE 5 % IV SOLN
2.0000 g | INTRAVENOUS | Status: AC
  Administered 2017-03-02 – 2017-03-06 (×6): 2 g via INTRAVENOUS
  Filled 2017-03-01 (×6): qty 2

## 2017-03-01 MED ORDER — PROPOFOL 10 MG/ML IV BOLUS
40.0000 mg | Freq: Once | INTRAVENOUS | Status: AC
  Administered 2017-03-01: 60 ug via INTRAVENOUS

## 2017-03-01 MED ORDER — SODIUM CHLORIDE 0.9% FLUSH
3.0000 mL | Freq: Two times a day (BID) | INTRAVENOUS | Status: DC
  Administered 2017-03-02: 3 mL via INTRAVENOUS

## 2017-03-01 MED ORDER — DEXTROSE 5 % IV SOLN
500.0000 mg | INTRAVENOUS | Status: DC
  Administered 2017-03-02 (×2): 500 mg via INTRAVENOUS
  Filled 2017-03-01 (×2): qty 500

## 2017-03-01 MED ORDER — FAMOTIDINE IN NACL 20-0.9 MG/50ML-% IV SOLN
20.0000 mg | Freq: Two times a day (BID) | INTRAVENOUS | Status: DC
  Administered 2017-03-02 – 2017-03-06 (×10): 20 mg via INTRAVENOUS
  Filled 2017-03-01 (×10): qty 50

## 2017-03-01 NOTE — Progress Notes (Signed)
D: Pt on ventilator with several lines/tubes.  Pt attempting to pull at ETT despite sedation and reorientation.  A: Pt placed in B soft wrist restraints per MD order.  R: Pt at decreased risk of pulling at lines/tubes.

## 2017-03-01 NOTE — ED Provider Notes (Addendum)
[place] DEPT Provider Note   CSN: [id number] Arrival date & time: 03/01/17  1827     History   Chief Complaint Chief Complaint  Patient presents with  . Shortness of Breath    HPI Luis [name] is a 51 y.o. male.  HPI   51 yo M with reported h/o HTN, off meds for "a while," here with SOB. History from pt and EMS due to pt's respiratory distress. Per EMS, they were called to pt's house due to progressively worsening dyspnea in setting of several weeks of LE edema and orthopnea. Pt was satting 84% on RA on their arrival with agitation, increased WOB. He was placed on CPAP with worsening agitation but improvement in O2 sats. On arrival, pt just states "I can't breathe, I can't breathe." Denies any CP. He is amenable/in agreement with intubation and is FULL CODE.  Level 5 caveat invoked as remainder of history, ROS, and physical exam limited due to patient's respiratory failure.   History reviewed. No pertinent past medical history.  There are no active problems to display for this patient.   History reviewed. No pertinent surgical history.     Home Medications    Prior to Admission medications   Not on File    Family History No family history on file.  Social History Social History  Substance Use Topics  . Smoking status: Unknown If Ever Smoked  . Smokeless tobacco: Not on file  . Alcohol use No     Allergies   Patient has no allergy information on record.   Review of Systems Review of Systems  Unable to perform ROS: Acuity of condition  Respiratory: Positive for cough and shortness of breath.   Cardiovascular: Positive for leg swelling.     Physical Exam Updated Vital Signs BP (!) 209/142   Pulse (!) 126   Resp (!) 50   Ht 6' (1.829 m)   Wt (!) 350 lb (158.8 kg)   SpO2 95%   BMI 47.47 kg/m   Physical Exam  Constitutional: He is oriented to person, place, and time. He appears well-developed and well-nourished. He appears distressed.    HENT:  Head: Normocephalic and atraumatic.  Eyes: Conjunctivae are normal.  Neck: Neck supple. JVD present.  Cardiovascular: Regular rhythm and normal heart sounds.  Tachycardia present.  Exam reveals no friction rub.   No murmur heard. Pulmonary/Chest: Accessory muscle usage present. Tachypnea noted. He is in respiratory distress. He has decreased breath sounds. He has no wheezes. He has rales in the right upper field, the right middle field, the right lower field, the left upper field, the left middle field and the left lower field.  Abdominal: He exhibits no distension.  Musculoskeletal: He exhibits no edema.  Neurological: He is alert and oriented to person, place, and time. He exhibits normal muscle tone.  Skin: Skin is warm. Capillary refill takes less than 2 seconds.  Psychiatric: He has a normal mood and affect.  Nursing note and vitals reviewed.    ED Treatments / Results  Labs (all labs ordered are listed, but only abnormal results are displayed) Labs Reviewed  I-STAT CHEM 8, ED - Abnormal; Notable for the following:       Result Value   Glucose, Bld 132 (*)    Calcium, Ion 1.09 (*)    All other components within normal limits  CBC WITH DIFFERENTIAL/PLATELET  COMPREHENSIVE METABOLIC PANEL  BRAIN NATRIURETIC PEPTIDE  MAGNESIUM  TRIGLYCERIDES  RAPID URINE DRUG SCREEN, HOSP PERFORMED  Randolm Idol, ED    EKG  EKG Interpretation  Date/Time:  _2  yo M here with  acute onset severe SOB. On arrival, pt tachypneic in the 40s, coughing yellow-white frothy sputum, speaking in 1-2 word sentences with severe dyspnea. He failed BIPAP en route due to agitation and pulling mask off. On my assessment, he has severe WOB and I am c/f acute flash edema 2/2 CHF. Discussed management options with pt - given severe increased WOB, failure of BIPAP, hypoxia, and marked tachypnea, feel intubation is required and pt in agreement. Pt sedated, intubated as above. O2Sats remained >90% throughout intubation. Placed on propofol/fentanyl gtt due to agitation. CXR shows bilateral  edema. EKG non-ischemic, trop neg though which is reassuring. Labs o/w largely unremarkable and reassuring. Suspect acute flash edema. He has a normal WBC, is afebrile, and acuity of onset is more c/w flash edema than a bacterial/infectious PNA at this time, but will need to be monitored closely. His hypotension is directly related to his sedation/propofol and I do not suspect severe sepsis at this time; moreover, I feel fluids are contra-indicated in setting of acute HF with flash edema and significant hypoxia. Will admit to ICU.  Of note, I notified pt's uncle and nephew at bedside who understand and are in agreement with plan of care. While awaiting ICU bed, mother arrived, upset about pt's intubation. I reassured her that pt was in agreement with plan and requires ETT for breathing at this time. She was concerned about propofol use given Roe Rutherford but I tried to reiterate taht it is safe with ETT in place. Nonetheless, she remained concerned and demanded propofol be discontinued - will start on versed PRN. Otherwise, she feels that pt does not want tube as he is pulling at it - I discussed that this is a normal reaction and that the ETT would be removed once it is medically safe. She seems to express understanding btu will likely benefit from ongoing discussions.  Final Clinical Impressions(s) / ED Diagnoses   Final diagnoses:  Acute on chronic systolic congestive heart failure (HCC)  Flash pulmonary edema (HCC)  Acute respiratory failure with hypoxia [facility])    New Prescriptions New Prescriptions   No medications on file     Duffy Bruce, MD 03/01/17 2126    Duffy Bruce, MD 03/02/17 [phone]

## 2017-03-01 NOTE — Progress Notes (Signed)
CRITICAL VALUE ALERT  Critical value received:  Trop-0.19  Date of notification:  03/01/2017  Time of notification:  11:59 PM  Critical value read back:Yes.    Nurse who received alert:  Terrilyn Saver  MD notified (1st page):  Dr. Craige Cotta  Time of first page:  11:59 PM  MD notified (2nd page):  Time of second page:  Responding MD:  Dr. Craige Cotta  Time MD responded:  11:59 PM

## 2017-03-01 NOTE — H&P (Addendum)
Name: Luis [name] MRN: [id number] DOB: 29-Feb-1966    LOS: 0  PCCM ADMISSION NOTE  History of Present Illness:  51 y/o M w/ HTN, hx of NICM per [facility] 03/2016, Afib, 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 Per mother the patient has had worsening DOE, BL LE edema and orthopnea for several weeks. He also was feeling ill with productive cough and myalgias after recent contact with sick children with similar symptoms. Mom went to get Nyquil for pt and EMS was present at home upon her return.   Upon arrival to the ED pt repeatedly stated "I cant breathe, I cant breathe" and he denied CP per EDP. He was saturating 84% on RA on presentation with agitation and increased WOB. Placed on CPAP with worsening agitation however improvement of O2 sats. He was intubated in ED and PCCM asked to admit. CMET and CBC wnl. BNP 244. Initial trop negative. CXR with ETT and diffuse patchy BL infiltrates + cardiomegaly concerning for pulmonary edema.   Lines / Drains: ETT 4/29>> OGT 4/29>> PIV x2 4/29>>  Cultures: Bcx 4/29>> Ucx 4/29 >> UA negative  Antibiotics: Ceftriaxone 4/29>> Azithromycin 4/29>>  Tests / Events: 4/29: Arrived to ED via EMS with SOB requiring intubation 4/29 CXR: +ETT. Diffuse patchy BL infiltrates  The patient is sedated, intubated and unable to provide history, which was obtained for available medical records.    Past medical History: Paroxysmal Atrial Fib Combined systolic and diastolic CHF HTN HLD NICM per [facility] 03/2016 Type 2 diabetes Tobacco abuse Morbid Obesity OSA  Past surgical history: [facility] 03/2026  Prior to Admission medications   Not on File   Allergies Morphine - Itching  Family History Mother: Alive with T2DM  Social History: Smokes 1.5 pks/day x18 years. Does not  drink alcohol. Lives at home with mother.   Review Of Systems  11 points review of systems is negative with an exception of listed in HPI.  Vital Signs: Pulse Rate:  [109-132] 115 (04/29 1925) Resp:  [12-50] 20 (04/29 1925) BP: (73-226)/(47-160) 81/55 (04/29 1925) SpO2:  [91 %-99 %] 91 % (04/29 1925) FiO2 (%):  [100 %] 100 % (04/29 1835) Weight:  [350 lb (158.8 kg)] 350 lb (158.8 kg) (04/29 1835) No intake/output data recorded.  Physical Examination: General: Morbidly obese critically-ill appearing AA male. Appears distressed. +ETT Neuro: Sedated. Moves all extremities spontaneously. Grimaces to pain.  HEENT: +ETT, +OG. Thick tan/pink sputum in tube Neck: Obese with ample girth. +JVD   Cardiovascular: RRR. No murmur appreciated.  Lungs: Diffuse rhonchi throughout.  Abdomen: Obese abdomen. Soft, non-tender. +BS Musculoskeletal: 3+ BL LE edema up to knees. Changes of chronic venous stasis BL.  Skin: No cyanosis. Extremities warm and perfused.   Ventilator settings: Vent Mode: PRVC FiO2 (%):  [100 %] 100 % Set Rate:  [20 bmp] 20 bmp Vt Set:  [600 mL] 600 mL PEEP:  [10 cmH20] 10 cmH20 Plateau Pressure:  [28 cmH20] 28 cmH20  Labs and Imaging:  Reviewed.  Please refer to the Assessment and Plan section for relevant results.  Assessment and Plan: 51 y/o M w/combined CHF, morbid obesity/OSA, HTN and DM2 here with acute respiratory failure requiring intubation 4/29. Noncompliant with medications for several months + several recent sick contacts. CXR with pulmonary infiltrates concerning for edema.   PULMONARY ASSESSMENT: Acute  respiratory failure w/ diffuse patchy pulmonary infiltrates concerning for pulmonary edema in setting of known combined CHF. Unable to r/o PNA from CXR although hx of recent sick contacts Tobacco abuse OSA PLAN:   Lasix Low TV ventilation, 6cc/kg Daily SBT Ceftriaxone + Azithromycin for empiric CAP tx Pct, Trach aspirate and resp viral panel  pending  CARDIOVASCULAR ASSESSMENT: Combined systolic and diastolic CHF (LVEF 35-40%, g2dd 03/2016). Not compliant with low-sodium low-volume diet. Not on lasix at home. BNP 243 NICM per [facility] 03/2016 HTN, HLD PLAN:  Lasix 80mg  TID Daily weights ECHO Telemetry Trend trops, initial neg x1 Consider metop if hypertensive  RENAL ASSESSMENT:  Cr stable. Lytes wnl.  PLAN:   Monitor UOP Monitor Cr in setting of diuresis. Replace lytes PRN  GASTROINTESTINAL ASSESSMENT:   GI PPx Nutrition PLAN:   Famotidine TF when more stable  HEMATOLOGIC ASSESSMENT:  NO anemia, no leukocytosis DVT ppx PLAN:  Lovenox dosed per pharm for weight  INFECTIOUS ASSESSMENT:  Unable to exclude CAP from CXR in this critically-ill patient with several recent sick contacts.  UA neg.  PLAN:   Empiric tx for CAP with Ceftriaxone + Azithromycin Bcx pend Trach GS/culture pending Pct, Resp viral panel pend Follow fever and WBC curves Urine legionella and strep pneumo Ag  ENDOCRINE ASSESSMENT:   Type 2 diabetes not on any DM meds at home.  PLAN:   SSI HbA1c  NEUROLOGIC ASSESSMENT:  Sedation PLAN:   Prop infusion with fent/versed PRN RAAS goal -1 UDS negative  Best practices / Disposition: -->ICU status under PCCM -->full code -->Lovenox for DVT Px -->Famotidine for GI Px -->ventilator bundle -->diet NPO for now, TF when more stable -->family updated at bedside  The patient is critically ill with multiple organ systems failure and requires high complexity decision making for assessment and support, frequent evaluation and titration of therapies, application of advanced monitoring technologies and extensive interpretation of multiple databases. Critical Care Time devoted to patient care services described in this note is 36 minutes.  Luis [name], D.O.  03/01/2017, 7:44 PM    ADMITTING PHYSICIAN ATTESTATION Luis [name] is a 51yo man with HFrEF, morbid obesity who does not seek regular  medical care and likely has metabolic syndrome who presented in respiratory distress which escalated over the past day in the setting of multiple sick contacts (his grandchildren) with lower respiratory symptoms. He has been having a worsening cough and sputum production over the past day per his mother but also has been gaining fluid swelling in his legs and dyspnea on exertion for the past several weeks. He has multiple dietary indiscretions. In the ED, he continued to have high work of breathing despite NIPPV and was intubated. CXR shows bilateral diffuse interstitial and alveolar opacities most consistent with pulmonary edema though he does have some dense retrocardiac opacities as well. On exam, he has coarse crackles throughout the lungs, tachycardia without appreciable murmurs, morbid obesity and 3+ pitting edema to his thighs in both LEs. His ventilator shows acceptable peak pressures with plateau pressures <30 despite tidal volumes. His FiO2 continues to come down since intubation.   In sum, he has acute respiratory failure likely secondary to decreased lung compliance from pulmonary edema and decompensated acute on chronic HFrEF +/- community acquired pneumonia. We will place him on ARDSNet protocol and reduce him to 52ml/kg tidal volumes. Cultures sent while starting ceftriaxone and azithromycin for CAP coverage. He will get scheduled furosemide. Will obtain EKG and troponins. He will need gradual initiation of  evidence based heart failure therapies. His mother was updated at bedside.  30 minutes critical care time was spent on this admission including management of mechanical ventilation and continuous sedation for respiratory failure.  Cornell Barman MD Pulmonology and Critical Care

## 2017-03-01 NOTE — Progress Notes (Signed)
Pt placed on droplet precautions per MD order.  Explanation given to patient however patient not interactive with staff d/t sedation.  Will educate family upon arrival.

## 2017-03-01 NOTE — ED Triage Notes (Signed)
Per EMS: Pt has had increasing SOB over the last couple of weeks.  Pt having a hard time catching his breath today and called 911.  Pt hasn't taken his Mx in "some time".  Pt was on C-pap when he arrived at the ED today   RR 50 216/100 120's ST

## 2017-03-01 NOTE — ED Notes (Signed)
Mother of Pt was allowed in the room and immediately started questioning what Meds pt was given and why.  Pt mother not happy pt was on "the drug that Donn Pierini was killed by" per mom.  Md made aware and talking to Pt's mother.  Pt's mom also asked for cloth to clean up pt's face.  Mom started to adjust the Pt's ET tubes, RN notified mom to not touch the tubes as they are breathing for her son. Pt mom wants "tubes to be loosened because they are cutting off his circulation around his face".  RN explained to mom that everything was placed by a RT or MD.  Pt's mother is now upset with this RN because I asked her to stop touching the Pt's airway for a third time. Pt's ET tube was starting to become disconnected and RN resecured.

## 2017-03-02 ENCOUNTER — Inpatient Hospital Stay (HOSPITAL_COMMUNITY): Payer: BLUE CROSS/BLUE SHIELD

## 2017-03-02 ENCOUNTER — Encounter (HOSPITAL_COMMUNITY): Payer: Self-pay | Admitting: *Deleted

## 2017-03-02 DIAGNOSIS — J810 Acute pulmonary edema: Secondary | ICD-10-CM

## 2017-03-02 DIAGNOSIS — J9600 Acute respiratory failure, unspecified whether with hypoxia or hypercapnia: Secondary | ICD-10-CM

## 2017-03-02 DIAGNOSIS — J9601 Acute respiratory failure with hypoxia: Secondary | ICD-10-CM

## 2017-03-02 DIAGNOSIS — I5023 Acute on chronic systolic (congestive) heart failure: Secondary | ICD-10-CM

## 2017-03-02 LAB — RESPIRATORY PANEL BY PCR
ADENOVIRUS-RVPPCR: NOT DETECTED
Bordetella pertussis: NOT DETECTED
CORONAVIRUS HKU1-RVPPCR: NOT DETECTED
CORONAVIRUS NL63-RVPPCR: NOT DETECTED
Chlamydophila pneumoniae: NOT DETECTED
Coronavirus 229E: NOT DETECTED
Coronavirus OC43: NOT DETECTED
INFLUENZA A H1 2009-RVPPR: DETECTED — AB
Influenza B: NOT DETECTED
METAPNEUMOVIRUS-RVPPCR: NOT DETECTED
Mycoplasma pneumoniae: NOT DETECTED
PARAINFLUENZA VIRUS 2-RVPPCR: NOT DETECTED
PARAINFLUENZA VIRUS 4-RVPPCR: NOT DETECTED
Parainfluenza Virus 1: NOT DETECTED
Parainfluenza Virus 3: NOT DETECTED
RHINOVIRUS / ENTEROVIRUS - RVPPCR: NOT DETECTED
Respiratory Syncytial Virus: NOT DETECTED

## 2017-03-02 LAB — BASIC METABOLIC PANEL
Anion gap: 11 (ref 5–15)
BUN: 15 mg/dL (ref 6–20)
CO2: 25 mmol/L (ref 22–32)
CREATININE: 2.06 mg/dL — AB (ref 0.61–1.24)
Calcium: 7.8 mg/dL — ABNORMAL LOW (ref 8.9–10.3)
Chloride: 101 mmol/L (ref 101–111)
GFR calc Af Amer: 42 mL/min — ABNORMAL LOW (ref >60)
GFR calc non Af Amer: 36 mL/min — ABNORMAL LOW (ref >60)
Glucose, Bld: 144 mg/dL — ABNORMAL HIGH (ref 65–99)
Potassium: 4.3 mmol/L (ref 3.5–5.1)
SODIUM: 137 mmol/L (ref 135–145)

## 2017-03-02 LAB — MAGNESIUM
MAGNESIUM: 2.1 mg/dL (ref 1.7–2.4)
Magnesium: 2 mg/dL (ref 1.7–2.4)

## 2017-03-02 LAB — BLOOD GAS, ARTERIAL
ACID-BASE DEFICIT: 1.9 mmol/L (ref 0.0–2.0)
Acid-base deficit: 2 mmol/L (ref 0.0–2.0)
BICARBONATE: 25.3 mmol/L (ref 20.0–28.0)
Bicarbonate: 23.5 mmol/L (ref 20.0–28.0)
DRAWN BY: 441371
Drawn by: 441371
FIO2: 60
FIO2: 60
MECHVT: 470 mL
O2 SAT: 96.4 %
O2 SAT: 98.1 %
PATIENT TEMPERATURE: 102.8
PATIENT TEMPERATURE: 101.2
PCO2 ART: 76.9 mmHg — AB (ref 32.0–48.0)
PCO2 ART: 52.9 mmHg — AB (ref 32.0–48.0)
PEEP: 10 cmH2O
PEEP: 10 cmH2O
PH ART: 7.16 — AB (ref 7.350–7.450)
PH ART: 7.28 — AB (ref 7.350–7.450)
PO2 ART: 116 mmHg — AB (ref 83.0–108.0)
PO2 ART: 132 mmHg — AB (ref 83.0–108.0)
RATE: 20 resp/min
RATE: 35 resp/min
VT: 470 mL

## 2017-03-02 LAB — PROCALCITONIN: Procalcitonin: 3.77 ng/mL

## 2017-03-02 LAB — GLUCOSE, CAPILLARY
GLUCOSE-CAPILLARY: 126 mg/dL — AB (ref 65–99)
GLUCOSE-CAPILLARY: 148 mg/dL — AB (ref 65–99)
GLUCOSE-CAPILLARY: 94 mg/dL (ref 65–99)
GLUCOSE-CAPILLARY: 97 mg/dL (ref 65–99)
Glucose-Capillary: 134 mg/dL — ABNORMAL HIGH (ref 65–99)
Glucose-Capillary: 97 mg/dL (ref 65–99)

## 2017-03-02 LAB — POCT I-STAT 3, ART BLOOD GAS (G3+)
ACID-BASE DEFICIT: 2 mmol/L (ref 0.0–2.0)
Bicarbonate: 23.4 mmol/L (ref 20.0–28.0)
O2 Saturation: 99 %
PH ART: 7.323 — AB (ref 7.350–7.450)
TCO2: 25 mmol/L (ref 0–100)
pCO2 arterial: 46 mmHg (ref 32.0–48.0)
pO2, Arterial: 176 mmHg — ABNORMAL HIGH (ref 83.0–108.0)

## 2017-03-02 LAB — TROPONIN I
Troponin I: 0.27 ng/mL (ref <0.03)
Troponin I: 0.21 ng/mL (ref <0.03)

## 2017-03-02 LAB — PHOSPHORUS
PHOSPHORUS: 6.4 mg/dL — AB (ref 2.5–4.6)
Phosphorus: 3.4 mg/dL (ref 2.5–4.6)

## 2017-03-02 LAB — STREP PNEUMONIAE URINARY ANTIGEN: Strep Pneumo Urinary Antigen: NEGATIVE

## 2017-03-02 LAB — MRSA PCR SCREENING: MRSA BY PCR: NEGATIVE

## 2017-03-02 LAB — LACTIC ACID, PLASMA: Lactic Acid, Venous: 1.2 mmol/L (ref 0.5–1.9)

## 2017-03-02 LAB — HIV ANTIBODY (ROUTINE TESTING W REFLEX): HIV SCREEN 4TH GENERATION: NONREACTIVE

## 2017-03-02 MED ORDER — ETOMIDATE 2 MG/ML IV SOLN
0.3000 mg/kg | Freq: Once | INTRAVENOUS | Status: AC
Start: 2017-03-02 — End: 2017-03-02

## 2017-03-02 MED ORDER — DEXTROSE 5 % IV SOLN
0.0000 ug/min | INTRAVENOUS | Status: DC
  Administered 2017-03-02: 2 ug/min via INTRAVENOUS
  Administered 2017-03-03: 1 ug/min via INTRAVENOUS
  Filled 2017-03-02 (×3): qty 4

## 2017-03-02 MED ORDER — OSELTAMIVIR PHOSPHATE 6 MG/ML PO SUSR
75.0000 mg | Freq: Two times a day (BID) | ORAL | Status: AC
  Administered 2017-03-02 – 2017-03-06 (×10): 75 mg via ORAL
  Filled 2017-03-02 (×11): qty 12.5

## 2017-03-02 MED ORDER — MIDAZOLAM HCL 2 MG/2ML IJ SOLN
2.0000 mg | Freq: Once | INTRAMUSCULAR | Status: AC
  Administered 2017-03-02: 2 mg via INTRAVENOUS

## 2017-03-02 MED ORDER — PRO-STAT SUGAR FREE PO LIQD
30.0000 mL | Freq: Two times a day (BID) | ORAL | Status: DC
  Administered 2017-03-02 – 2017-03-10 (×17): 30 mL
  Filled 2017-03-02 (×17): qty 30

## 2017-03-02 MED ORDER — VANCOMYCIN HCL IN DEXTROSE 750-5 MG/150ML-% IV SOLN
750.0000 mg | Freq: Two times a day (BID) | INTRAVENOUS | Status: DC
  Administered 2017-03-02: 750 mg via INTRAVENOUS
  Filled 2017-03-02 (×2): qty 150

## 2017-03-02 MED ORDER — VITAL HIGH PROTEIN PO LIQD: 1000.0000 mL | ORAL | Status: DC

## 2017-03-02 MED ORDER — SODIUM CHLORIDE 0.9 % IV SOLN
1.0000 mg/h | INTRAVENOUS | Status: DC
  Administered 2017-03-02: 3 mg/h via INTRAVENOUS
  Administered 2017-03-03: 4 mg/h via INTRAVENOUS
  Administered 2017-03-03 – 2017-03-04 (×2): 2 mg/h via INTRAVENOUS
  Filled 2017-03-02 (×5): qty 10

## 2017-03-02 MED ORDER — VANCOMYCIN HCL 10 G IV SOLR
2000.0000 mg | Freq: Once | INTRAVENOUS | Status: AC
  Administered 2017-03-02: 2000 mg via INTRAVENOUS
  Filled 2017-03-02: qty 2000

## 2017-03-02 MED ORDER — SODIUM CHLORIDE 0.9 % IV BOLUS (SEPSIS)
1000.0000 mL | Freq: Once | INTRAVENOUS | Status: AC
  Administered 2017-03-02: 1000 mL via INTRAVENOUS

## 2017-03-02 MED ORDER — SODIUM CHLORIDE 0.9 % IV SOLN
INTRAVENOUS | Status: AC
  Administered 2017-03-02: 11:00:00 via INTRAVENOUS

## 2017-03-02 MED ORDER — VITAL HIGH PROTEIN PO LIQD
1000.0000 mL | ORAL | Status: DC
  Administered 2017-03-02 – 2017-03-08 (×12): 1000 mL

## 2017-03-02 NOTE — Progress Notes (Signed)
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 Per mother the patient has had worsening DOE, BL LE edema and orthopnea for several weeks. He also was feeling ill with productive cough and myalgias after recent contact with sick children with similar symptoms. Mom went to get Nyquil for pt and EMS was present at home upon her return.   Upon arrival to the ED pt repeatedly stated "I cant breathe, I cant breathe" and he denied CP per EDP. He was saturating 84% on RA on presentation with agitation and increased WOB. Placed on CPAP with worsening agitation however improvement of O2 sats. He was intubated in ED and PCCM asked to admit. CMET and CBC wnl. BNP 244. Initial trop negative. CXR with ETT and diffuse patchy BL infiltrates + cardiomegaly concerning for pulmonary edema.   Lines / Drains: ETT 4/29>> OGT 4/29>> PIV x2 4/29>>  Cultures: Bcx 4/29>> Ucx 4/29 >> UA negative  Antibiotics: Ceftriaxone 4/29>> Azithromycin 4/29>>  Tests / Events: 4/29: Arrived to ED via EMS with SOB requiring intubation 4/29 CXR: +ETT. Diffuse patchy BL infiltrates  Overnight: fever, trop up   Vital Signs: Temp:  [102.5 F (39.2 C)-103.3 F (39.6 C)] 102.8 F (39.3 C) (04/30 0740) Pulse Rate:  [81-267] 90 (04/30 0740) Resp:  [12-50] 15 (04/30 0740) BP: (73-226)/(45-160) 104/60 (04/30 0700) SpO2:  [89 %-100 %] 94 % (04/30 0740) FiO2 (%):  [60 %-100 %] 60 % (04/30 0600) Weight:  [350 lb (158.8 kg)-368 lb (166.9 kg)] 368 lb (166.9 kg) (04/30 0400) I/O last 3 completed shifts: In: 901.8 [I.V.:451.8; NG/GT:150; IV Piggyback:300] Out: 2075  [Urine:2075]  Physical Examination: General: Morbidly obese critically-ill appearing AA male. +ETT Neuro: Sedated. Wakes up and opens eyes, does not follow commands.  HEENT: +ETT, +OG. Thick tan/pink sputum in tube Neck: Obese with ample girth. +JVD   Cardiovascular: RRR. No murmur appreciated.  Lungs: Diffuse rhonchi throughout.  Abdomen: Obese abdomen. Soft, non-tender. +BS Musculoskeletal: 3+ BL LE edema up to knees. Changes of chronic venous stasis BL.  Skin: No cyanosis. Extremities warm and perfused.   Ventilator settings: Vent Mode: PRVC FiO2 (%):  [60 %-100 %] 60 % Set Rate:  [20 bmp] 20 bmp Vt Set:  [470 mL-600 mL] 470 mL PEEP:  [10 cmH20] 10 cmH20 Plateau Pressure:  [22 cmH20-29 cmH20] 22 cmH20  Labs and Imaging:  Reviewed.  Please refer to the Assessment and Plan section for relevant results.  Assessment and Plan: 51 y/o M w/combined CHF, morbid obesity/OSA, HTN and DM2 here with acute respiratory failure requiring intubation 4/29. Noncompliant with medications for several months + several recent sick contacts. CXR with pulmonary infiltrates concerning for edema.   PULMONARY ASSESSMENT: Acute respiratory failure w/ diffuse patchy pulmonary infiltrates concerning for pulmonary edema in setting of known combined CHF. There is also high suspicion for PNA as patient is spiking fever.  Tobacco abuse OSA -1.2L after 40mg x1 and 80mg x1.  PLAN:   Lasix 80mg  TID ordered, Low TV ventilation, 6cc/kg Daily SBT Ceftriaxone + Azithromycin for empiric CAP tx, f/up cultures.  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 Not compliant with low-sodium low-volume diet. Not on lasix at home. BNP 243 NICM per [facility] 03/2016 Paroxysmal Afib - not on anticoag.  HTN, HLD  Troponin mildly elevated 0.19 >> 0.27. EKG showed no ischemic changes.  PLAN:  Lasix 80mg  TID, consider dc Daily  weights ECHO Telemetry Trend trops Avoid bblocker with acute CHF   RENAL ASSESSMENT:  AKI - could be 2/2 to CHF or from overdiuresis.  PLAN:   Monitor UOP Monitor Cr in setting of diuresis. If worsening, will need to hold lasix.   GASTROINTESTINAL ASSESSMENT:   GI PPx Nutrition malpaced Tube PLAN:   Famotidine TF when more stable and new tube  HEMATOLOGIC ASSESSMENT:  NO anemia, no leukocytosis DVT ppx PLAN:  Lovenox dosed per pharm for weight  INFECTIOUS ASSESSMENT:  Could have CAP b/c he is spiking fevers and had CXR showing edema which could also be infiltrates.  UA neg.  PLAN:   Empiric tx for CAP with Ceftriaxone + Azithromycin Bcx pend Trach GS/culture pending Pct, Resp viral panel pend Follow fever and WBC curves  ENDOCRINE ASSESSMENT:   Type 2 diabetes not on any DM meds at home.  PLAN:   SSI HbA1c  NEUROLOGIC ASSESSMENT:  Sedation PLAN:   Prop infusion with fent/versed PRN RAAS goal -1 UDS negative  Luis [name]  STAFF NOTE: I, Rory Percy, MD FACP have personally reviewed patient's available data, including medical history, events of note, physical examination and test results as part of my evaluation. I have discussed with resident/NP and other care providers such as pharmacist, RN and RRT. In addition, I personally evaluated patient and elicited key findings of: awakens, fc, diaphoretic, ronchi, obese, abdo soft, tachy, I am concerned about PNA and NOT failure, crt rise, dc lasix, consider cvp assessment for volume status in setting worsneing status and renal fxn, ABg last reviewed, Increase MV, repeat pao2 also to determine O2 needs/ peep and if pulse ox accurate, repeat pcxr now ( worsening? ), keep plat less 30 , get cvp, add vanc with worsening status, await resp viral panel, replace NGT and feed, lov can stay but monitor crt clearance, may need to change to sub q heparin , may need empiric tamiflu, he looks dry, dc all diuretic, repeat  chem in am, allow pos balance The patient is critically ill with multiple organ systems failure and requires high complexity decision making for assessment and support, frequent evaluation and titration of therapies, application of advanced monitoring technologies and extensive interpretation of multiple databases.   Critical Care Time devoted to patient care services described in this note is 35 Minutes. This time reflects time of care of this signee: Rory Percy, MD FACP. This critical care time does not reflect procedure time, or teaching time or supervisory time of PA/NP/Med student/Med Resident etc but could involve care discussion time. Rest per NP/medical resident whose note is outlined above and that I agree with   Mcarthur Rossetti. Tyson Alias, MD, FACP Pgr: [phone] [place] [facility] 03/02/2017 10:19 AM

## 2017-03-02 NOTE — Progress Notes (Signed)
Initial Nutrition Assessment  DOCUMENTATION CODES:   Morbid obesity  INTERVENTION:    Vital High Protein at goal rate of 85 ml/h (2040 ml per day) and Prostat 30 ml BID   TF regimen to provide 2240 kcals, 208 gm protein, 1705 ml free water daily  NUTRITION DIAGNOSIS:   Inadequate oral intake related to inability to eat as evidenced by NPO status  GOAL:   Provide needs based on ASPEN/SCCM guidelines  MONITOR:   Vent status, TF tolerance, Labs, Weight trends, I & O's, Skin  REASON FOR ASSESSMENT:   Consult Enteral/tube feeding initiation and management  ASSESSMENT:   51 y/o M w/ HTN, hx of NICM per [facility] 03/2016, Afib, 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 Per mother the patient has had worsening DOE, BL LE edema and orthopnea for several weeks  Patient is currently intubated on ventilator support Temp (24hrs), Avg:103 F (39.4 C), Min:102.5 F (39.2 C), Max:103.3 F (39.6 C)  OGT in place  Pt with acute respiratory failure w/pulmonary infiltrates for pulmonary edema.  Also in setting of known combined CHF. Has been noncompliant for several months with medications.  + several recent sick contacts. Vital High Protein formula & Prostat initiated via Adult Tube Feeding Protocol. Medications reviewed.  Labs reviewed.  Phos 6.4 (H). CBG's [id number].  Nutrition focused physical exam completed.  No muscle or subcutaneous fat depletion noticed.  Diet Order:  Diet NPO time specified  Skin:  Reviewed, no issues  Last BM:  PTA  Height:   Ht Readings from Last 1 Encounters:  03/02/17 6' (1.829 m)   Weight:   Wt Readings from Last 1 Encounters:  03/02/17 (!) 368 lb (166.9 kg)   Ideal Body Weight:  81 kg  BMI:  Body mass index is 49.91 kg/m.  Estimated Nutritional Needs:    Kcal:  8657-8469  Protein:  >/= 202 gm  Fluid:  per MD  EDUCATION NEEDS:   No education needs identified at this time  Maureen Chatters, RD, LDN Pager #: [phone] After-Hours Pager #: [phone]

## 2017-03-02 NOTE — Procedures (Signed)
Central Venous Catheter Insertion Procedure Note Luis [name] [id number] 09-10-1966  Procedure: Insertion of Central Venous Catheter Indications: Assessment of intravascular volume, Drug and/or fluid administration and Frequent blood sampling  Procedure Details Consent: Risks of procedure as well as the alternatives and risks of each were explained to the (patient/caregiver).  Consent for procedure obtained. Time Out: Verified patient identification, verified procedure, site/side was marked, verified correct patient position, special equipment/implants available, medications/allergies/relevent history reviewed, required imaging and test results available.  Performed  Maximum sterile technique was used including antiseptics, cap, gloves, gown, hand hygiene, mask and sheet. Skin prep: Chlorhexidine; local anesthetic administered A antimicrobial bonded/coated triple lumen catheter was placed in the left  internal jugular vein using the Seldinger technique.  Evaluation Blood flow good Complications: No apparent complications Patient did tolerate procedure well. Chest X-ray ordered to verify placement.  CXR: pending.  Nelda Bucks 03/02/2017, 12:49 PM \  US guidance  Mcarthur Rossetti. Tyson Alias, MD, FACP Pgr: [phone] [place] [facility]

## 2017-03-02 NOTE — Care Management Note (Signed)
Case Management Note Donn Pierini RN, BSN Unit 2W-Case Manager--2H coverage [phone]  Patient Details  Name: Luis [name] MRN: [id number] Date of Birth: [birth year]  Subjective/Objective:    Pt admitted with acute HF-- currently intubated- on vent                Action/Plan: PTA pt lived at home- has family support (mother, uncle, nephew)- CM to follow for d/c needs  Expected Discharge Date:                  Expected Discharge Plan:  Home/Self Care  In-House Referral:     Discharge planning Services  CM Consult  Post Acute Care Choice:    Choice offered to:     DME Arranged:    DME Agency:     HH Arranged:    HH Agency:     Status of Service:  In process, will continue to follow  If discussed at Long Length of Stay Meetings, dates discussed:    Discharge Disposition:   Additional Comments:  Darrold Span, RN 03/02/2017, 11:34 AM

## 2017-03-02 NOTE — Progress Notes (Signed)
Ordered cooling blanket for patient, notified by portable equipment the hospital currently does not have one in stock for use. No cooling blankets found on floor. Will continue to monitor.

## 2017-03-02 NOTE — Progress Notes (Signed)
Pharmacy Antibiotic Note  Luis [name] is a 51 y.o. male admitted on 03/01/2017 with pneumonia.  Pharmacy has been consulted for vancomycin dosing.  Already receiving azithromycin and ceftriaxone for suspected CAP.  Viral panel pending.  Plan: 1. Vancomycin 2g IV x 1 now. 2. Then start vancomycin 1250 mg IV q 12 hrs. 3. Watch rising Scr/renal function. 4. F/u cultures and clinical course.  Height: 6' (182.9 cm) Weight: (!) 368 lb (166.9 kg) IBW/kg (Calculated) : 77.6  Temp (24hrs), Avg:103 F (39.4 C), Min:102.5 F (39.2 C), Max:103.3 F (39.6 C)   Recent Labs Lab 03/01/17 1840 03/01/17 1846 03/02/17 0353  WBC 9.3  --   --   CREATININE 1.10 1.00 2.06*    Estimated Creatinine Clearance: 69.5 mL/min (A) (by C-G formula based on SCr of 2.06 mg/dL (H)).    Allergies  Allergen Reactions  . Demerol [Meperidine] Itching  . Morphine And Related Itching    Antimicrobials this admission: Azithro 4/29 > Ceftriaxone 4/29 > Vancomycin 4/30>  Dose adjustments this admission:   Microbiology results: 4/29 BCx x 2:  * UCx:   4/29 Sputum:  4/29 MRSA PCR: neg  Thank you for allowing pharmacy to be a part of this patient's care.  Tad Moore, BCPS  Clinical Pharmacist Pager [phone]  03/02/2017 10:56 AM

## 2017-03-02 NOTE — Progress Notes (Signed)
Patient ID: Luis [name], male   DOB: June 15, 1966, 51 y.o.   MRN: [id number]  Remain with ronchi, now acidotic, co 2 76 Now bordelrine BP worsening in setting ARF  Called to bedside to update family I updated mom, aunt and sister They were concerned about medication interactions, and seemed to  Have lack of trust in health care I explained entire circumstances and organ failures They have agreed to line , and understood all risks They also requests list of all meds being used, [facility] to provide  Will increase rate 35 rass -3 to -4 needed now with dyschrony and acidosis NO WUA Add empiric tamiflu as we wait for pcr May need aline May need versed drip  Now ccm time 85 min in total  Mcarthur Rossetti. Tyson Alias, MD, FACP Pgr: [phone] [name] [facility]

## 2017-03-02 NOTE — Progress Notes (Signed)
Mother, daughter and aunt at bedside. Mother stating, "she wants her son off the ventilator". Education given to mother about respiratory status along with vitals and lab work. MD at bedside also updating family. Critical lab value of ABG reported to Dr. Tyson Alias -PH of 7.16 and CO2 of 76. Orders to change RR to 35 given.   Will continue to monitor.

## 2017-03-03 ENCOUNTER — Inpatient Hospital Stay (HOSPITAL_COMMUNITY): Payer: BLUE CROSS/BLUE SHIELD

## 2017-03-03 DIAGNOSIS — I5023 Acute on chronic systolic (congestive) heart failure: Secondary | ICD-10-CM

## 2017-03-03 DIAGNOSIS — I509 Heart failure, unspecified: Secondary | ICD-10-CM

## 2017-03-03 DIAGNOSIS — J810 Acute pulmonary edema: Secondary | ICD-10-CM

## 2017-03-03 DIAGNOSIS — Z978 Presence of other specified devices: Secondary | ICD-10-CM

## 2017-03-03 LAB — ECHOCARDIOGRAM COMPLETE
E decel time: 173 msec
E/e' ratio: 10.62
FS: 20 % — AB (ref 28–44)
HEIGHTINCHES: 72 in
IVS/LV PW RATIO, ED: 1.09
LA diam end sys: 49 mm
LA diam index: 1.62 cm/m2
LA vol A4C: 41 ml
LA vol: 70.5 mL
LASIZE: 49 mm
LAVOLIN: 23.2 mL/m2
LV E/e' medial: 10.62
LV E/e'average: 10.62
LV PW d: 14.1 mm — AB (ref 0.6–1.1)
LV TDI E'LATERAL: 7.26
LV TDI E'MEDIAL: 5.57
LVELAT: 7.26 cm/s
LVOT VTI: 19 cm
LVOT area: 4.15 cm2
LVOT diameter: 23 mm
LVOT peak grad rest: 8 mmHg
LVOTPV: 137 cm/s
LVOTSV: 79 mL
MV Dec: 173
MV pk A vel: 74.2 m/s
MV pk E vel: 77.1 m/s
MVPG: 2 mmHg
RV LATERAL S' VELOCITY: 12 cm/s
RV TAPSE: 31.1 mm
WEIGHTICAEL: 6016 [oz_av]

## 2017-03-03 LAB — POCT I-STAT 3, ART BLOOD GAS (G3+)
ACID-BASE DEFICIT: 2 mmol/L (ref 0.0–2.0)
Acid-base deficit: 4 mmol/L — ABNORMAL HIGH (ref 0.0–2.0)
Bicarbonate: 22.7 mmol/L (ref 20.0–28.0)
Bicarbonate: 24 mmol/L (ref 20.0–28.0)
O2 Saturation: 89 %
O2 Saturation: 90 %
PATIENT TEMPERATURE: 102.5
PCO2 ART: 52.2 mmHg — AB (ref 32.0–48.0)
Patient temperature: 100.6
TCO2: 24 mmol/L (ref 0–100)
TCO2: 25 mmol/L (ref 0–100)
pCO2 arterial: 49.2 mmHg — ABNORMAL HIGH (ref 32.0–48.0)
pH, Arterial: 7.253 — ABNORMAL LOW (ref 7.350–7.450)
pH, Arterial: 7.306 — ABNORMAL LOW (ref 7.350–7.450)
pO2, Arterial: 70 mmHg — ABNORMAL LOW (ref 83.0–108.0)
pO2, Arterial: 73 mmHg — ABNORMAL LOW (ref 83.0–108.0)

## 2017-03-03 LAB — GLUCOSE, CAPILLARY
GLUCOSE-CAPILLARY: 113 mg/dL — AB (ref 65–99)
GLUCOSE-CAPILLARY: 127 mg/dL — AB (ref 65–99)
GLUCOSE-CAPILLARY: 162 mg/dL — AB (ref 65–99)
GLUCOSE-CAPILLARY: 192 mg/dL — AB (ref 65–99)
GLUCOSE-CAPILLARY: 279 mg/dL — AB (ref 65–99)
Glucose-Capillary: 196 mg/dL — ABNORMAL HIGH (ref 65–99)

## 2017-03-03 LAB — BASIC METABOLIC PANEL
Anion gap: 6 (ref 5–15)
BUN: 29 mg/dL — AB (ref 6–20)
CALCIUM: 6.6 mg/dL — AB (ref 8.9–10.3)
CO2: 21 mmol/L — ABNORMAL LOW (ref 22–32)
CREATININE: 2 mg/dL — AB (ref 0.61–1.24)
Chloride: 110 mmol/L (ref 101–111)
GFR calc Af Amer: 43 mL/min — ABNORMAL LOW (ref >60)
GFR, EST NON AFRICAN AMERICAN: 37 mL/min — AB (ref >60)
GLUCOSE: 112 mg/dL — AB (ref 65–99)
Potassium: 4.6 mmol/L (ref 3.5–5.1)
SODIUM: 137 mmol/L (ref 135–145)

## 2017-03-03 LAB — HEMOGLOBIN A1C
Hgb A1c MFr Bld: 8.2 % — ABNORMAL HIGH (ref 4.8–5.6)
Mean Plasma Glucose: 189 mg/dL

## 2017-03-03 LAB — MAGNESIUM
MAGNESIUM: 1.9 mg/dL (ref 1.7–2.4)
MAGNESIUM: 2 mg/dL (ref 1.7–2.4)

## 2017-03-03 LAB — LEGIONELLA PNEUMOPHILA SEROGP 1 UR AG: L. PNEUMOPHILA SEROGP 1 UR AG: NEGATIVE

## 2017-03-03 LAB — PHOSPHORUS
PHOSPHORUS: 2 mg/dL — AB (ref 2.5–4.6)
Phosphorus: 2.9 mg/dL (ref 2.5–4.6)

## 2017-03-03 LAB — VANCOMYCIN, TROUGH: Vancomycin Tr: 13 ug/mL — ABNORMAL LOW (ref 15–20)

## 2017-03-03 LAB — PROCALCITONIN: Procalcitonin: 2.42 ng/mL

## 2017-03-03 MED ORDER — PERFLUTREN LIPID MICROSPHERE
1.0000 mL | INTRAVENOUS | Status: AC | PRN
  Filled 2017-03-03: qty 10

## 2017-03-03 MED ORDER — HEPARIN SODIUM (PORCINE) 5000 UNIT/ML IJ SOLN
5000.0000 [IU] | Freq: Three times a day (TID) | INTRAMUSCULAR | Status: DC
  Administered 2017-03-03 – 2017-03-17 (×38): 5000 [IU] via SUBCUTANEOUS
  Filled 2017-03-03 (×39): qty 1

## 2017-03-03 MED ORDER — PERFLUTREN LIPID MICROSPHERE
INTRAVENOUS | Status: AC
  Administered 2017-03-03: 2 mL
  Filled 2017-03-03: qty 10

## 2017-03-03 MED ORDER — SODIUM CHLORIDE 0.9% FLUSH: 10.0000 mL | INTRAVENOUS | Status: DC | PRN

## 2017-03-03 MED ORDER — SODIUM PHOSPHATES 45 MMOLE/15ML IV SOLN
20.0000 mmol | Freq: Once | INTRAVENOUS | Status: AC
  Administered 2017-03-03: 20 mmol via INTRAVENOUS
  Filled 2017-03-03: qty 6.67

## 2017-03-03 MED ORDER — SODIUM CHLORIDE 0.9% FLUSH
10.0000 mL | Freq: Two times a day (BID) | INTRAVENOUS | Status: DC
  Administered 2017-03-03 – 2017-03-10 (×15): 10 mL

## 2017-03-03 MED ORDER — VANCOMYCIN HCL IN DEXTROSE 750-5 MG/150ML-% IV SOLN
750.0000 mg | Freq: Two times a day (BID) | INTRAVENOUS | Status: DC
  Administered 2017-03-03 – 2017-03-04 (×2): 750 mg via INTRAVENOUS
  Filled 2017-03-03 (×3): qty 150

## 2017-03-03 MED ORDER — CHLORHEXIDINE GLUCONATE CLOTH 2 % EX PADS
6.0000 | MEDICATED_PAD | Freq: Every day | CUTANEOUS | Status: DC
  Administered 2017-03-03 – 2017-03-09 (×7): 6 via TOPICAL

## 2017-03-03 MED ORDER — METHYLPREDNISOLONE SODIUM SUCC 40 MG IJ SOLR
40.0000 mg | Freq: Two times a day (BID) | INTRAMUSCULAR | Status: DC
  Administered 2017-03-03 – 2017-03-05 (×5): 40 mg via INTRAVENOUS
  Filled 2017-03-03 (×5): qty 1

## 2017-03-03 MED ORDER — SODIUM BICARBONATE 8.4 % IV SOLN
INTRAVENOUS | Status: DC
  Administered 2017-03-03 – 2017-03-04 (×2): via INTRAVENOUS
  Filled 2017-03-03 (×2): qty 150

## 2017-03-03 NOTE — Progress Notes (Signed)
Pharmacy Antibiotic Note  Luis [name] is a 51 y.o. male admitted on 03/01/2017 with pneumonia.  Pharmacy has been consulted for vancomycin dosing.  Already receiving azithromycin and ceftriaxone for suspected CAP.  He is also noted with influenza on tamiflu.  -SCr 1.0>>2.0, tmax= 102.8 -vancomycin level= 13, last dose was at midnight  Plan: -Continue vancomycin 750mg  IV q12h -Will follow renal function, cultures and clinical progress   Height: 6' (182.9 cm) Weight: (!) 376 lb (170.6 kg) IBW/kg (Calculated) : 77.6  Temp (24hrs), Avg:100.4 F (38 C), Min:98 F (36.7 C), Max:102.3 F (39.1 C)   Recent Labs Lab 03/01/17 1840 03/01/17 1846 03/02/17 0353 03/02/17 1600 03/03/17 0645 03/03/17 1134  WBC 9.3  --   --   --   --   --   CREATININE 1.10 1.00 2.06*  --  2.00*  --   LATICACIDVEN  --   --   --  1.2  --   --   VANCOTROUGH  --   --   --   --   --  13*    Estimated Creatinine Clearance: 72.5 mL/min (A) (by C-G formula based on SCr of 2 mg/dL (H)).    Allergies  Allergen Reactions  . Demerol [Meperidine] Itching  . Morphine And Related Itching    Antimicrobials this admission: Azithro 4/29 > Ceftriaxone 4/29 > Vancomycin 4/30>  Dose adjustments this admission:   Microbiology results: 4/29 BCx x 2: - ngtd 4/29 resp panel- influ A 4/29 Sputum: GPC/pairs, G variable rod 4/29 MRSA PCR: neg  Thank you for allowing pharmacy to be a part of this patient's care.  Harland German, Pharm D 03/03/2017 12:49 PM

## 2017-03-03 NOTE — Progress Notes (Signed)
  Echocardiogram 2D Echocardiogram with Definity has been performed.  Nolon Rod 03/03/2017, 1:19 PM

## 2017-03-03 NOTE — Progress Notes (Signed)
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 Per mother the patient has had worsening DOE, BL LE edema and orthopnea for several weeks. He also was feeling ill with productive cough and myalgias after recent contact with sick children with similar symptoms. Mom went to get Nyquil for pt and EMS was present at home upon her return.   Upon arrival to the ED pt repeatedly stated "I cant breathe, I cant breathe" and he denied CP per EDP. He was saturating 84% on RA on presentation with agitation and increased WOB. Placed on CPAP with worsening agitation however improvement of O2 sats. He was intubated in ED and PCCM asked to admit. CMET and CBC wnl. BNP 244. Initial trop negative. CXR with ETT and diffuse patchy BL infiltrates + cardiomegaly concerning for pulmonary edema.   Lines / Drains: ETT 4/29>> OGT 4/29>> PIV x2 4/29>> Left IJ 4/30>>>  Cultures: Bcx 4/29>> NGTD Ucx 4/29 >> UA negative  Antibiotics: Ceftriaxone 4/29>> Azithromycin 4/29>>> 4/30 tamifli ( empiric)>>> 4/30 vanc>>>  Tests / Events: 4/29: Arrived to ED via EMS with SOB requiring intubation 4/29 CXR: +ETT. Diffuse patchy BL infiltrates  Overnight: Influenza A positive, started on tamiflu  Vital Signs: Temp:  [98 F (36.7 C)-102.8 F (39.3 C)] 98.7 F (37.1 C) (05/01 0400) Pulse Rate:  [70-105] 70 (05/01 0600) Resp:  [15-35] 35 (05/01 0600) BP: (76-131)/(45-90) 96/69 (05/01 0600) SpO2:  [89 %-98 %] 96 % (05/01 0600) FiO2 (%):  [60 %] 60 % (05/01 0307) Weight:  [376 lb (170.6 kg)] 376 lb (170.6 kg) (05/01 0500) I/O last 3 completed shifts: In: 3893.6 [I.V.:2278.6;  NG/GT:1315; IV Piggyback:300] Out: 3600 [Urine:3600]  Physical Examination: General: Morbidly obese critically-ill appearing AA male. +ETT Neuro: Sedated. does not follow commands.  HEENT: +ETT, +OG.  Neck: Obese with ample girth.  Cardiovascular: RRR. No murmur appreciated.  Lungs: Diffuse rhonchi throughout.  Abdomen: Obese abdomen. Soft, non-tender. +BS Musculoskeletal: 3+ BL LE edema up to knees. Changes of chronic venous stasis BL.  Skin: No cyanosis. Extremities warm and perfused.   Ventilator settings: Vent Mode: PRVC FiO2 (%):  [60 %] 60 % Set Rate:  [20 bmp-35 bmp] 35 bmp Vt Set:  [470 mL] 470 mL PEEP:  [10 cmH20] 10 cmH20 Plateau Pressure:  [26 cmH20-28 cmH20] 28 cmH20  Labs and Imaging:  Reviewed.  Please refer to the Assessment and Plan section for relevant results.  Assessment and Plan: 51 y/o M w/combined CHF, morbid obesity/OSA, HTN and DM2 here with acute respiratory failure requiring intubation 4/29. Noncompliant with medications for several months + several recent sick contacts. CXR with pulmonary infiltrates concerning for edema.   PULMONARY ASSESSMENT: Acute respiratory failure w/ diffuse patchy pulmonary infiltrates concerning for pulmonary edema in setting of known combined CHF. There is also high suspicion for PNA as patient is spiking fever.- PCT is 3.77 Influenza A  Tobacco abuse OSA -1.5 after lasix 80 IV x 2 PLAN:   Low TV ventilation, 6cc/kg Daily SBT Ceftriaxone + Azithromycin for empiric CAP tx, f/up cultures.  Trach aspirate pending  ABG Tamiflu   CARDIOVASCULAR ASSESSMENT: Combined systolic and diastolic  CHF (LVEF 35-40%, g2dd 03/2016). Not compliant with low-sodium low-volume diet. Not on lasix at home. BNP 243 NICM per [facility] 03/2016 Paroxysmal Afib - not on anticoag.  HTN, HLD  Troponin mildly elevated 0.19 >> 0.27>>0.21.  PLAN:  Daily weights ECHO Telemetry Avoid bblocker with acute CHF   RENAL ASSESSMENT:  AKI - could be 2/2 to  CHF or from overdiuresis.  PLAN:   Monitor UOP and creatinine  Holding lasix  GASTROINTESTINAL ASSESSMENT:   GI PPx Nutrition malpaced Tube PLAN:   Famotidine TF when more stable and new tube  HEMATOLOGIC ASSESSMENT:  NO anemia, no leukocytosis DVT ppx PLAN:  Lovenox dosed per pharm for weight  INFECTIOUS ASSESSMENT:  Could have CAP b/c he is spiking fevers and had CXR showing edema which could also be infiltrates.  + Influenza A UA neg.  PLAN:   Empiric tx for CAP with Ceftriaxone + Azithromycin Bcx NGTD Trach GS/culture pending Pct 3- continue antibiotics Follow fever and WBC curves Tamiflu  ENDOCRINE ASSESSMENT:   Type 2 diabetes not on any DM meds at home.  HbA1c - 8.2, currently blood glucose controlled PLAN:   SSI   NEUROLOGIC ASSESSMENT:  Sedation PLAN:   Prop infusion with fent/versed PRN RAAS goal -1 UDS negative  Geralyn Corwin   STAFF NOTE: I, Rory Percy, MD FACP have personally reviewed patient's available data, including medical history, events of note, physical examination and test results as part of my evaluation. I have discussed with resident/NP and other care providers such as pharmacist, RN and RRT. In addition, I personally evaluated patient and elicited key findings of: rass -4, coarse BS, obese, line clean, edema none, secretions noted, pcxr with hazy bases, influenza A pos ( note empiric tamiflu started day prior), will keep at 6 cc/kg, we ar e about at plat 30, will repeat abg now goal to peep 5 and possible SBT, rass -2 is goal, even balance goals today to slight neg, SBT likely after abg eval, hope peep to 5 will also reduce pressures, add steroids for flu pneumonitis, upright is goal in bed, advance ett 2 cm, repeat pcxr in am, has renal failure, ATN, cvp 13 down from 20, consider echo for pa pressure contribution, kvo to maintain, bmet in am, small NONAG from saline - avoid saline, change 1/2 NS, follow cvp trend and pa  pressures on echo, started feeds, to goal, tamiflu course dose adjusted for renal failure, dc azithromycin, likley in am will narrow further, add steroids for pneumonitis concerns,  Glu controlled, I had sister and mom participate on rounds all questions ansered, levophed dc done , MAp goals successful on own The patient is critically ill with multiple organ systems failure and requires high complexity decision making for assessment and support, frequent evaluation and titration of therapies, application of advanced monitoring technologies and extensive interpretation of multiple databases.   Critical Care Time devoted to patient care services described in this note is 35 Minutes. This time reflects time of care of this signee: Rory Percy, MD FACP. This critical care time does not reflect procedure time, or teaching time or supervisory time of PA/NP/Med student/Med Resident etc but could involve care discussion time. Rest per NP/medical resident whose note is outlined above and that I agree with   Mcarthur Rossetti. Tyson Alias, MD, FACP Pgr: [phone] [place] [facility] 03/03/2017 9:38 AM

## 2017-03-03 NOTE — Progress Notes (Signed)
eLink Physician-Brief Progress Note Patient Name: Calon Filar DOB: [birth year] MRN: [id number]   Date of Service  03/03/2017  HPI/Events of Note  K+ = 4.6, PO4--- = 2.0 and Creatinine = 2.0.  eICU Interventions  Will order: 1. Replace PO4---. 2. PO4--- level in AM. 1. Repeat BMP at 11 PM.      Intervention Category Major Interventions: Electrolyte abnormality - evaluation and management  [name],Steven Eugene 03/03/2017, 6:05 PM

## 2017-03-03 NOTE — Progress Notes (Signed)
1400 ABG results not crossing over  pH 7.30. CO2 49.2, pO2 73, Bicarb 24, BE -2

## 2017-03-04 ENCOUNTER — Inpatient Hospital Stay (HOSPITAL_COMMUNITY): Payer: BLUE CROSS/BLUE SHIELD

## 2017-03-04 LAB — BASIC METABOLIC PANEL
Anion gap: 8 (ref 5–15)
BUN: 24 mg/dL — AB (ref 6–20)
CO2: 26 mmol/L (ref 22–32)
Calcium: 7.8 mg/dL — ABNORMAL LOW (ref 8.9–10.3)
Chloride: 103 mmol/L (ref 101–111)
Creatinine, Ser: 1.35 mg/dL — ABNORMAL HIGH (ref 0.61–1.24)
GFR calc Af Amer: >60 mL/min (ref >60)
GLUCOSE: 215 mg/dL — AB (ref 65–99)
POTASSIUM: 4.5 mmol/L (ref 3.5–5.1)
Sodium: 137 mmol/L (ref 135–145)

## 2017-03-04 LAB — BLOOD GAS, ARTERIAL
ACID-BASE EXCESS: 1.3 mmol/L (ref 0.0–2.0)
BICARBONATE: 27.3 mmol/L (ref 20.0–28.0)
Drawn by: 419771
FIO2: 50
LHR: 35 {breaths}/min
MECHVT: 580 mL
O2 Saturation: 92.7 %
PATIENT TEMPERATURE: 98.6
PEEP/CPAP: 5 cmH2O
PO2 ART: 76.5 mmHg — AB (ref 83.0–108.0)
pCO2 arterial: 60.3 mmHg — ABNORMAL HIGH (ref 32.0–48.0)
pH, Arterial: 7.279 — ABNORMAL LOW (ref 7.350–7.450)

## 2017-03-04 LAB — POCT I-STAT 3, ART BLOOD GAS (G3+)
ACID-BASE EXCESS: 1 mmol/L (ref 0.0–2.0)
BICARBONATE: 24.8 mmol/L (ref 20.0–28.0)
O2 Saturation: 98 %
TCO2: 26 mmol/L (ref 0–100)
pCO2 arterial: 36.5 mmHg (ref 32.0–48.0)
pH, Arterial: 7.44 (ref 7.350–7.450)
pO2, Arterial: 95 mmHg (ref 83.0–108.0)

## 2017-03-04 LAB — GLUCOSE, CAPILLARY
GLUCOSE-CAPILLARY: 163 mg/dL — AB (ref 65–99)
GLUCOSE-CAPILLARY: 206 mg/dL — AB (ref 65–99)
GLUCOSE-CAPILLARY: 208 mg/dL — AB (ref 65–99)
Glucose-Capillary: 121 mg/dL — ABNORMAL HIGH (ref 65–99)
Glucose-Capillary: 223 mg/dL — ABNORMAL HIGH (ref 65–99)
Glucose-Capillary: 186 mg/dL — ABNORMAL HIGH (ref 65–99)
Glucose-Capillary: 200 mg/dL — ABNORMAL HIGH (ref 65–99)

## 2017-03-04 LAB — CULTURE, RESPIRATORY: CULTURE: NORMAL

## 2017-03-04 LAB — COMPREHENSIVE METABOLIC PANEL
ALT: 23 U/L (ref 17–63)
ANION GAP: 8 (ref 5–15)
AST: 53 U/L — ABNORMAL HIGH (ref 15–41)
Albumin: 2.9 g/dL — ABNORMAL LOW (ref 3.5–5.0)
Alkaline Phosphatase: 87 U/L (ref 38–126)
BUN: 24 mg/dL — ABNORMAL HIGH (ref 6–20)
CHLORIDE: 102 mmol/L (ref 101–111)
CO2: 28 mmol/L (ref 22–32)
Calcium: 8.1 mg/dL — ABNORMAL LOW (ref 8.9–10.3)
Creatinine, Ser: 1.31 mg/dL — ABNORMAL HIGH (ref 0.61–1.24)
GFR calc non Af Amer: >60 mL/min (ref >60)
Glucose, Bld: 238 mg/dL — ABNORMAL HIGH (ref 65–99)
Potassium: 4.6 mmol/L (ref 3.5–5.1)
SODIUM: 138 mmol/L (ref 135–145)
Total Bilirubin: 0.6 mg/dL (ref 0.3–1.2)
Total Protein: 6.8 g/dL (ref 6.5–8.1)

## 2017-03-04 LAB — CULTURE, RESPIRATORY W GRAM STAIN

## 2017-03-04 LAB — TRIGLYCERIDES: Triglycerides: 196 mg/dL — ABNORMAL HIGH (ref <150)

## 2017-03-04 LAB — CALCIUM, IONIZED: CALCIUM, IONIZED, SERUM: 4.3 mg/dL — AB (ref 4.5–5.6)

## 2017-03-04 LAB — PHOSPHORUS: Phosphorus: 3.2 mg/dL (ref 2.5–4.6)

## 2017-03-04 MED ORDER — FUROSEMIDE 10 MG/ML IJ SOLN
60.0000 mg | Freq: Two times a day (BID) | INTRAMUSCULAR | Status: DC
  Administered 2017-03-04 – 2017-03-11 (×14): 60 mg via INTRAVENOUS
  Filled 2017-03-04 (×14): qty 6

## 2017-03-04 NOTE — Progress Notes (Signed)
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 Per mother the patient has had worsening DOE, BL LE edema and orthopnea for several weeks. He also was feeling ill with productive cough and myalgias after recent contact with sick children with similar symptoms. Mom went to get Nyquil for pt and EMS was present at home upon her return.   Upon arrival to the ED pt repeatedly stated "I cant breathe, I cant breathe" and he denied CP per EDP. He was saturating 84% on RA on presentation with agitation and increased WOB. Placed on CPAP with worsening agitation however improvement of O2 sats. He was intubated in ED and PCCM asked to admit. CMET and CBC wnl. BNP 244. Initial trop negative. CXR with ETT and diffuse patchy BL infiltrates + cardiomegaly concerning for pulmonary edema.   Lines / Drains: ETT 4/29>> OGT 4/29>> PIV x2 4/29>> Left IJ 4/30>>>  Cultures: Bcx 4/29>> NGTD Ucx 4/29 >> Sputum 4/29>>>NF UA negative  Antibiotics: Ceftriaxone 4/29>> Azithromycin 4/29>>>5/1 4/30 tamifli ( empiric)>>> 4/30 vanc>>>5/2  Tests / Events: 4/29: Arrived to ED via EMS with SOB requiring intubation 4/29 CXR: +ETT. Diffuse patchy BL infiltrates  Overnight: Mucous plugging, lavage during the night  Vital Signs: Temp:  [98.2 F (36.8 C)-102.5 F (39.2 C)] 98.9 F (37.2 C) (05/02 0400) Pulse Rate:  [59-110] 69 (05/02 0600) Resp:  [23-35] 35 (05/02 0600) BP: (85-148)/(62-93) 116/89 (05/02 0600) SpO2:  [91 %-100 %] 95 % (05/02 0600) FiO2 (%):  [40 %-60 %] 50 % (05/02 0311) Weight:  [380 lb (172.4 kg)] 380 lb (172.4 kg) (05/02 0500) I/O last 3 completed  shifts: In: 5523.2 [I.V.:2758.2; NG/GT:2300; IV Piggyback:465] Out: 3600 [Urine:3600]  Physical Examination: General: Morbidly obese critically-ill appearing AA male. +ETT Neuro: Sedated. Follow commands.  HEENT: +ETT, +OG.  Neck: Obese with ample girth.  Cardiovascular: RRR. No murmur appreciated.  Lungs: Diffuse rhonchi throughout.  Abdomen: Obese abdomen. Distended. +BS Musculoskeletal: 2+ pitting edema bilaterally to knees.  Skin: No cyanosis. Extremities warm and perfused.   Ventilator settings: Vent Mode: PRVC FiO2 (%):  [40 %-60 %] 50 % Set Rate:  [35 bmp] 35 bmp Vt Set:  [470 mL] 470 mL PEEP:  [5 cmH20-8 cmH20] 5 cmH20 Plateau Pressure:  [16 cmH20-32 cmH20] 32 cmH20  Labs and Imaging:  Reviewed.  Please refer to the Assessment and Plan section for relevant results.  Assessment and Plan: 51 y/o M w/combined CHF, morbid obesity/OSA, HTN and DM2 here with acute respiratory failure requiring intubation 4/29. Noncompliant with medications for several months + several recent sick contacts. CXR with pulmonary infiltrates concerning for edema.   PULMONARY ASSESSMENT: Acute respiratory failure w/ diffuse patchy pulmonary infiltrates concerning for pulmonary edema in setting of known combined CHF. 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  Not on lasix at home. BNP 243 NICM per [facility] 03/2016 Paroxysmal Afib -  not on anticoag.  HTN, HLD  Troponin mildly elevated with stable trend 0.19 >> 0.27>>0.21.  PLAN:  Daily weights Telemetry Avoid bblocker with acute CHF   RENAL ASSESSMENT:  AKI - from overdiuresis. Creatinine stable currently 1.3 >>1.35 >>2.00 2.5L output PLAN:   Monitor UOP and  creatinine  Holding lasix  GASTROINTESTINAL ASSESSMENT:   GI PPx Nutrition  PLAN:   Famotidine TF  HEMATOLOGIC ASSESSMENT:  NO anemia, no leukocytosis DVT ppx PLAN:  Lovenox dosed per pharm for weight  INFECTIOUS ASSESSMENT:  Could have CAP b/c he is spiking fevers and had CXR showing edema which could also be infiltrates.  + Influenza A UA neg.  PLAN:   Ceftriaxone  Bcx NGTD Trach GS/culture pending Pct 2.4- continue antibiotics Follow fever and WBC curves Tamiflu  ENDOCRINE ASSESSMENT:   Type 2 diabetes not on any DM meds at home.  HbA1c - 8.2, currently blood glucose controlled PLAN:   SSI   NEUROLOGIC ASSESSMENT:  Sedation PLAN:   Prop infusion with fent/versed PRN RAAS goal -1 UDS negative  Geralyn Corwin   STAFF NOTE: I, Rory Percy, MD FACP have personally reviewed patient's available data, including medical history, events of note, physical examination and test results as part of my evaluation. I have discussed with resident/NP and other care providers such as pharmacist, RN and RRT. In addition, I personally evaluated patient and elicited key findings of: awakens, follows commands, rass -2, WUA done, some agitation, pcxr which I reviewed has prior linear atx rt, increasing int changes c/w edema, pos balance 1.8 liters, ABG reviewed requires high MV, and some permissive hypercapnia, would tolerate ph greater 7.20 if needed, may look to liberalize T v if plat allow with improvement, hope with diuresis start will improve vent needs, ARf improved, start lasix 60 iv bid, kvo ensure, dc bicarb, maintain steroids for pneumonitis, repeat abg in 4 hours, NF I sputum, dc vanc, maintain ctx, consider 5 days , maintain tamiflu, dose wnl with resolved renal fxn, I updated mom and sister in full, add chest pt with linear changes and mucous overnight, abg in 4 hours The patient is critically ill with multiple organ systems failure and requires high complexity  decision making for assessment and support, frequent evaluation and titration of therapies, application of advanced monitoring technologies and extensive interpretation of multiple databases.   Critical Care Time devoted to patient care services described in this note is35 Minutes. This time reflects time of care of this signee: Rory Percy, MD FACP. This critical care time does not reflect procedure time, or teaching time or supervisory time of PA/NP/Med student/Med Resident etc but could involve care discussion time. Rest per NP/medical resident whose note is outlined above and that I agree with   Mcarthur Rossetti. Tyson Alias, MD, FACP Pgr: [phone] [place] [facility] 03/04/2017 10:21 AM

## 2017-03-04 NOTE — Progress Notes (Signed)
Called E-link and reported ABG results to Dr. Belia Heman

## 2017-03-04 NOTE — Progress Notes (Signed)
Per call with Tyson Alias, RR on vent reduced from 35 to 26

## 2017-03-04 NOTE — Progress Notes (Signed)
eLink Physician-Brief Progress Note Patient Name: Nevo Klausen DOB: 09/19/1966 MRN: [id number]   Date of Service  03/04/2017  HPI/Events of Note  ABG reviewed 7.29/60  eICU Interventions  TV increased to 520     Intervention Category Evaluation Type: Other  Erin Fulling 03/04/2017, 6:49 AM

## 2017-03-05 ENCOUNTER — Inpatient Hospital Stay (HOSPITAL_COMMUNITY): Payer: BLUE CROSS/BLUE SHIELD

## 2017-03-05 LAB — GLUCOSE, CAPILLARY
GLUCOSE-CAPILLARY: 185 mg/dL — AB (ref 65–99)
GLUCOSE-CAPILLARY: 232 mg/dL — AB (ref 65–99)
Glucose-Capillary: 226 mg/dL — ABNORMAL HIGH (ref 65–99)
Glucose-Capillary: 176 mg/dL — ABNORMAL HIGH (ref 65–99)
Glucose-Capillary: 208 mg/dL — ABNORMAL HIGH (ref 65–99)
Glucose-Capillary: 212 mg/dL — ABNORMAL HIGH (ref 65–99)

## 2017-03-05 LAB — CBC
HCT: 41.9 % (ref 39.0–52.0)
Hemoglobin: 13.3 g/dL (ref 13.0–17.0)
MCH: 30.2 pg (ref 26.0–34.0)
MCHC: 31.7 g/dL (ref 30.0–36.0)
MCV: 95 fL (ref 78.0–100.0)
PLATELETS: 204 10*3/uL (ref 150–400)
RBC: 4.41 MIL/uL (ref 4.22–5.81)
RDW: 14.8 % (ref 11.5–15.5)
WBC: 5.4 10*3/uL (ref 4.0–10.5)

## 2017-03-05 LAB — BLOOD GAS, ARTERIAL
Acid-Base Excess: 2.7 mmol/L — ABNORMAL HIGH (ref 0.0–2.0)
BICARBONATE: 26.4 mmol/L (ref 20.0–28.0)
FIO2: 40
LHR: 26 {breaths}/min
MECHVT: 620 mL
O2 Saturation: 97.4 %
PATIENT TEMPERATURE: 98.6
PCO2 ART: 38.4 mmHg (ref 32.0–48.0)
PEEP: 5 cmH2O
PO2 ART: 103 mmHg (ref 83.0–108.0)
pH, Arterial: 7.453 — ABNORMAL HIGH (ref 7.350–7.450)

## 2017-03-05 LAB — BASIC METABOLIC PANEL
Anion gap: 11 (ref 5–15)
BUN: 34 mg/dL — AB (ref 6–20)
CALCIUM: 8.4 mg/dL — AB (ref 8.9–10.3)
CO2: 27 mmol/L (ref 22–32)
CREATININE: 1.35 mg/dL — AB (ref 0.61–1.24)
Chloride: 102 mmol/L (ref 101–111)
GFR calc non Af Amer: >60 mL/min (ref >60)
Glucose, Bld: 211 mg/dL — ABNORMAL HIGH (ref 65–99)
Potassium: 4.1 mmol/L (ref 3.5–5.1)
SODIUM: 140 mmol/L (ref 135–145)

## 2017-03-05 LAB — TRIGLYCERIDES: TRIGLYCERIDES: 194 mg/dL — AB (ref <150)

## 2017-03-05 MED ORDER — PREDNISONE 20 MG PO TABS
30.0000 mg | ORAL_TABLET | Freq: Every day | ORAL | Status: DC
  Administered 2017-03-06 – 2017-03-10 (×5): 30 mg
  Filled 2017-03-05 (×5): qty 1

## 2017-03-05 MED ORDER — PROPOFOL 1000 MG/100ML IV EMUL
0.0000 ug/kg/min | INTRAVENOUS | Status: DC
  Administered 2017-03-05: 15 ug/kg/min via INTRAVENOUS
  Administered 2017-03-05: 25 ug/kg/min via INTRAVENOUS
  Administered 2017-03-05: 10 ug/kg/min via INTRAVENOUS
  Administered 2017-03-06: 15 ug/kg/min via INTRAVENOUS
  Administered 2017-03-06: 20 ug/kg/min via INTRAVENOUS
  Administered 2017-03-06: 25 ug/kg/min via INTRAVENOUS
  Administered 2017-03-06: 20 ug/kg/min via INTRAVENOUS
  Administered 2017-03-06: 25 ug/kg/min via INTRAVENOUS
  Administered 2017-03-06 – 2017-03-07 (×2): 15 ug/kg/min via INTRAVENOUS
  Filled 2017-03-05 (×10): qty 100

## 2017-03-05 MED ORDER — SODIUM CHLORIDE 0.9 % IV SOLN
INTRAVENOUS | Status: DC
  Administered 2017-03-07 (×2): via INTRAVENOUS

## 2017-03-05 MED ORDER — METHYLPREDNISOLONE SODIUM SUCC 40 MG IJ SOLR: 40.0000 mg | Freq: Every day | INTRAMUSCULAR | Status: DC

## 2017-03-05 NOTE — Progress Notes (Signed)
Wasted 48ml of versed in sink with Education administrator.

## 2017-03-05 NOTE — Progress Notes (Signed)
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 Per mother the patient has had worsening DOE, BL LE edema and orthopnea for several weeks. He also was feeling ill with productive cough and myalgias after recent contact with sick children with similar symptoms. Mom went to get Nyquil for pt and EMS was present at home upon her return.   Upon arrival to the ED pt repeatedly stated "I cant breathe, I cant breathe" and he denied CP per EDP. He was saturating 84% on RA on presentation with agitation and increased WOB. Placed on CPAP with worsening agitation however improvement of O2 sats. He was intubated in ED and PCCM asked to admit. CMET and CBC wnl. BNP 244. Initial trop negative. CXR with ETT and diffuse patchy BL infiltrates + cardiomegaly concerning for pulmonary edema.   Lines / Drains: ETT 4/29>> OGT 4/29>> PIV x2 4/29>> Left IJ 4/30>>>  Cultures: Bcx 4/29>> NGTD Ucx 4/29 >> Sputum 4/29>>>NF UA negative  Antibiotics: Ceftriaxone 4/29>> Azithromycin 4/29>>>5/1 4/30 tamifli ( empiric)>>> 4/30 vanc>>>5/2  Tests / Events: 4/29: Arrived to ED via EMS with SOB requiring intubation 4/29 CXR: +ETT. Diffuse patchy BL infiltrates  Overnight: less secretions.  Off versed, following commands, neg 880 cc  Vital Signs: Temp:  [97.7 F (36.5 C)-98.7 F (37.1 C)] 97.7 F (36.5 C) (05/03 0358) Pulse Rate:  [53-87] 65 (05/03 0700) Resp:  [20-35] 26 (05/03 0700) BP: (97-167)/(44-113) 167/111 (05/03 0700) SpO2:  [94 %-100 %] 99 % (05/03 0700) FiO2 (%):  [40 %-50 %] 40 % (05/03 0346) Weight:  [343 lb (155.6 kg)] 343 lb (155.6 kg) (05/03 0446) I/O  last 3 completed shifts: In: 4274.4 [I.V.:1896.1; NG/GT:2328.3; IV Piggyback:50] Out: 3800 [Urine:3800]  Physical Examination: General: Morbidly obese. +ETT Neuro:  Follow commands, opens eyes and moves toes  HEENT: +ETT, +OG.  Neck: Obese with ample girth.  Cardiovascular: RRR. No murmur appreciated.  Lungs: Diffuse rhonchi throughout.  Abdomen: Obese abdomen. Distended. +BS Musculoskeletal: Trace edema bilaterally to knees.  Skin: No cyanosis. Extremities warm and perfused.   Ventilator settings: Vent Mode: PRVC FiO2 (%):  [40 %-50 %] 40 % Set Rate:  [26 bmp-35 bmp] 26 bmp Vt Set:  [520 mL-620 mL] 620 mL PEEP:  [5 cmH20] 5 cmH20 Plateau Pressure:  [23 cmH20-33 cmH20] 29 cmH20  Labs and Imaging:  Reviewed.  Please refer to the Assessment and Plan section for relevant results.  Assessment and Plan: 51 y/o M w/combined CHF, morbid obesity/OSA, HTN and DM2 here with acute respiratory failure requiring intubation 4/29. Noncompliant with medications for several months + several recent sick contacts. CXR with pulmonary infiltrates concerning for edema.   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  Not on lasix at home. BNP 243 NICM per [facility] 03/2016 Paroxysmal Afib -  not on anticoag.  HTN, HLD  Troponin mildly elevated with stable trend 0.19 >> 0.27>>0.21.  PLAN:  Daily weights Telemetry   RENAL ASSESSMENT:  AKI - from overdiuresis. Creatinine stable currently 1.3 >>1.3 >>2.00 3.3L output PLAN:   Monitor UOP and creatinine  IV Lasix 60  BID  GASTROINTESTINAL ASSESSMENT:   GI PPx Nutrition  PLAN:   Famotidine TF  HEMATOLOGIC ASSESSMENT:  NO anemia, no leukocytosis DVT ppx PLAN:  Lovenox dosed per pharm for weight  INFECTIOUS ASSESSMENT:  Could have CAP b/c he is spiking fevers and had CXR showing edema which could also be infiltrates.  + Influenza A UA neg.   PLAN:   Ceftriaxone  Bcx NGTD Trach GS/culture > normal flora Consider DC antibiotics Follow fever and WBC curves Tamiflu  ENDOCRINE ASSESSMENT:   Type 2 diabetes not on any DM meds at home.  HbA1c - 8.2, currently blood glucose controlled PLAN:   SSI   NEUROLOGIC ASSESSMENT:  Sedation PLAN:   Prop infusion with fent/versed PRN RAAS goal 0 UDS negative  Geralyn Corwin   STAFF NOTE: I, Rory Percy, MD FACP have personally reviewed patient's available data, including medical history, events of note, physical examination and test results as part of my evaluation. I have discussed with resident/NP and other care providers such as pharmacist, RN and RRT. In addition, I personally evaluated patient and elicited key findings of: awakens, fc, rass -2, lungs no wheezing, clearing, no pressors, was neg 880 cc, his vent is improving, ABg this am reviewed, drop rate to 16, pcxr with edema, escalate lasix, WEAN aggressive SBT 5/5, pcxr in am, similar net neg balance goals remain with lasix, lytes in am , chair position as goal as able, remains culture neg bacteria, CTX to stop date tamiflu to 5 days then dc, reduce solumedrol to daily then off over 3 days, feeding with success, if weaning will will hold for possible extubation, sedation is versed, fent, would prefer agent without delirium risk, prop? precedex and short acting, fent to goal 100 mic, I updated family in full at bedside, they participated in rounds The patient is critically ill with multiple organ systems failure and requires high complexity decision making for assessment and support,  frequent evaluation and titration of therapies, application of advanced monitoring technologies and extensive interpretation of multiple databases.   Critical Care Time devoted to patient care services described in this note is35 Minutes. This time reflects time of care of this signee: Rory Percy, MD FACP. This critical care time does not reflect procedure time, or teaching time or supervisory time of PA/NP/Med student/Med Resident etc but could involve care discussion time. Rest per NP/medical resident whose note is outlined above and that I agree with   Mcarthur Rossetti. Tyson Alias, MD, FACP Pgr: [phone] [place] [facility] 03/05/2017 9:47 AM

## 2017-03-05 NOTE — Progress Notes (Signed)
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 Per mother the patient has had worsening DOE, BL LE edema and orthopnea for several weeks. He also was feeling ill with productive cough and myalgias after recent contact with sick children with similar symptoms. Mom went to get Nyquil for pt and EMS was present at home upon her return.   Upon arrival to the ED pt repeatedly stated "I cant breathe, I cant breathe" and he denied CP per EDP. He was saturating 84% on RA on presentation with agitation and increased WOB. Placed on CPAP with worsening agitation however improvement of O2 sats. He was intubated in ED and PCCM asked to admit. CMET and CBC wnl. BNP 244. Initial trop negative. CXR with ETT and diffuse patchy BL infiltrates + cardiomegaly concerning for pulmonary edema.   Lines / Drains: ETT 4/29>> OGT 4/29>> PIV x2 4/29>> Left IJ 4/30>>>  Cultures: Bcx 4/29>> NGTD Ucx 4/29 >> Sputum 4/29>>>NF UA negative  Antibiotics: Ceftriaxone 4/29>> Azithromycin 4/29>>>5/1 4/30 tamifli ( empiric)>>> 4/30 vanc>>>5/2  Tests / Events: 4/29: Arrived to ED via EMS with SOB requiring intubation 4/29 CXR: +ETT. Diffuse patchy BL infiltrates  Overnight: less secretions.  Off versed, following commands  Vital Signs: Temp:  [97.7 F (36.5 C)-98.7 F (37.1 C)] 97.7 F (36.5 C) (05/03 0358) Pulse Rate:  [53-87] 65 (05/03 0700) Resp:  [20-35] 26 (05/03 0700) BP: (97-167)/(44-113) 167/111 (05/03 0700) SpO2:  [94 %-100 %] 99 % (05/03 0700) FiO2 (%):  [40 %-50 %] 40 % (05/03 0346) Weight:  [343 lb (155.6 kg)] 343 lb (155.6 kg) (05/03 0446) I/O last 3  completed shifts: In: 4274.4 [I.V.:1896.1; NG/GT:2328.3; IV Piggyback:50] Out: 3800 [Urine:3800]  Physical Examination: General: Morbidly obese. +ETT Neuro:  Follow commands, opens eyes and moves toes  HEENT: +ETT, +OG.  Neck: Obese with ample girth.  Cardiovascular: RRR. No murmur appreciated.  Lungs: Diffuse rhonchi throughout.  Abdomen: Obese abdomen. Distended. +BS Musculoskeletal: Trace edema bilaterally to knees.  Skin: No cyanosis. Extremities warm and perfused.   Ventilator settings: Vent Mode: PRVC FiO2 (%):  [40 %-50 %] 40 % Set Rate:  [26 bmp-35 bmp] 26 bmp Vt Set:  [520 mL-620 mL] 620 mL PEEP:  [5 cmH20] 5 cmH20 Plateau Pressure:  [23 cmH20-33 cmH20] 29 cmH20  Labs and Imaging:  Reviewed.  Please refer to the Assessment and Plan section for relevant results.  Assessment and Plan: 51 y/o M w/combined CHF, morbid obesity/OSA, HTN and DM2 here with acute respiratory failure requiring intubation 4/29. Noncompliant with medications for several months + several recent sick contacts. CXR with pulmonary infiltrates concerning for edema.   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  Not on lasix at home. BNP 243 NICM per [facility] 03/2016 Paroxysmal Afib - not on anticoag.  HTN, HLD  Troponin mildly elevated with stable trend 0.19 >> 0.27>>0.21.  PLAN:  Daily weights Telemetry   RENAL ASSESSMENT:  AKI - from overdiuresis. Creatinine stable currently 1.3 >>1.3 >>2.00 3.3L output PLAN:   Monitor UOP and creatinine  IV Lasix 60 BID  GASTROINTESTINAL ASSESSMENT:    GI PPx Nutrition  PLAN:   Famotidine TF  HEMATOLOGIC ASSESSMENT:  NO anemia, no leukocytosis DVT ppx PLAN:  Lovenox dosed per pharm for weight  INFECTIOUS ASSESSMENT:  Could have CAP b/c he is spiking fevers and had CXR showing edema which could also be infiltrates.  + Influenza A UA neg.   PLAN:   Ceftriaxone  Bcx NGTD Trach GS/culture > normal flora Consider DC antibiotics Follow fever and WBC curves Tamiflu  ENDOCRINE ASSESSMENT:   Type 2 diabetes not on any DM meds at home.  HbA1c - 8.2, currently blood glucose controlled PLAN:   SSI   NEUROLOGIC ASSESSMENT:  Sedation PLAN:   Prop infusion with fent/versed PRN RAAS goal 0 UDS negative  Geralyn Corwin     See my note on same day for additional documentation  Mcarthur Rossetti. Tyson Alias, MD, FACP Pgr: [phone] [place]

## 2017-03-06 ENCOUNTER — Encounter (HOSPITAL_COMMUNITY): Payer: Self-pay | Admitting: Cardiology

## 2017-03-06 ENCOUNTER — Inpatient Hospital Stay (HOSPITAL_COMMUNITY): Payer: BLUE CROSS/BLUE SHIELD

## 2017-03-06 LAB — BASIC METABOLIC PANEL
ANION GAP: 10 (ref 5–15)
Anion gap: 11 (ref 5–15)
BUN: 37 mg/dL — AB (ref 6–20)
BUN: 41 mg/dL — ABNORMAL HIGH (ref 6–20)
CALCIUM: 8.2 mg/dL — AB (ref 8.9–10.3)
CALCIUM: 8.2 mg/dL — AB (ref 8.9–10.3)
CHLORIDE: 100 mmol/L — AB (ref 101–111)
CO2: 31 mmol/L (ref 22–32)
CO2: 33 mmol/L — ABNORMAL HIGH (ref 22–32)
CREATININE: 1.32 mg/dL — AB (ref 0.61–1.24)
CREATININE: 1.35 mg/dL — AB (ref 0.61–1.24)
Chloride: 102 mmol/L (ref 101–111)
GFR calc Af Amer: >60 mL/min (ref >60)
GFR calc non Af Amer: >60 mL/min (ref >60)
GFR calc non Af Amer: >60 mL/min (ref >60)
GLUCOSE: 195 mg/dL — AB (ref 65–99)
Glucose, Bld: 200 mg/dL — ABNORMAL HIGH (ref 65–99)
Potassium: 3.3 mmol/L — ABNORMAL LOW (ref 3.5–5.1)
Potassium: 4 mmol/L (ref 3.5–5.1)
SODIUM: 145 mmol/L (ref 135–145)
Sodium: 142 mmol/L (ref 135–145)

## 2017-03-06 LAB — CULTURE, BLOOD (ROUTINE X 2)
Culture: NO GROWTH
Culture: NO GROWTH
Special Requests: ADEQUATE
Special Requests: ADEQUATE

## 2017-03-06 LAB — GLUCOSE, CAPILLARY
GLUCOSE-CAPILLARY: 166 mg/dL — AB (ref 65–99)
GLUCOSE-CAPILLARY: 209 mg/dL — AB (ref 65–99)
GLUCOSE-CAPILLARY: 250 mg/dL — AB (ref 65–99)
Glucose-Capillary: 194 mg/dL — ABNORMAL HIGH (ref 65–99)
Glucose-Capillary: 236 mg/dL — ABNORMAL HIGH (ref 65–99)

## 2017-03-06 LAB — CBC
HCT: 43.4 % (ref 39.0–52.0)
Hemoglobin: 13.6 g/dL (ref 13.0–17.0)
MCH: 30.3 pg (ref 26.0–34.0)
MCHC: 31.3 g/dL (ref 30.0–36.0)
MCV: 96.7 fL (ref 78.0–100.0)
PLATELETS: 199 10*3/uL (ref 150–400)
RBC: 4.49 MIL/uL (ref 4.22–5.81)
RDW: 15 % (ref 11.5–15.5)
WBC: 7.1 10*3/uL (ref 4.0–10.5)

## 2017-03-06 LAB — MAGNESIUM: MAGNESIUM: 2.1 mg/dL (ref 1.7–2.4)

## 2017-03-06 LAB — PHOSPHORUS: Phosphorus: 3.1 mg/dL (ref 2.5–4.6)

## 2017-03-06 MED ORDER — POTASSIUM CHLORIDE 20 MEQ/15ML (10%) PO SOLN
20.0000 meq | ORAL | Status: AC
  Administered 2017-03-06 (×2): 20 meq
  Filled 2017-03-06 (×2): qty 15

## 2017-03-06 MED ORDER — GLYCOPYRROLATE 0.2 MG/ML IJ SOLN
0.2000 mg | INTRAMUSCULAR | Status: DC | PRN
  Administered 2017-03-06 – 2017-03-08 (×3): 0.2 mg via INTRAVENOUS
  Filled 2017-03-06 (×3): qty 1

## 2017-03-06 MED ORDER — CHLORHEXIDINE GLUCONATE 0.12 % MT SOLN
OROMUCOSAL | Status: AC
  Administered 2017-03-06: 15 mL
  Filled 2017-03-06: qty 15

## 2017-03-06 MED ORDER — PANTOPRAZOLE SODIUM 40 MG PO PACK
40.0000 mg | PACK | ORAL | Status: DC
  Administered 2017-03-06 – 2017-03-08 (×3): 40 mg
  Filled 2017-03-06 (×3): qty 20

## 2017-03-06 NOTE — Progress Notes (Signed)
CCM RN notified of pt's K+ 3.3 - awaiting orders.

## 2017-03-06 NOTE — Progress Notes (Signed)
[facility] ADULT ICU REPLACEMENT PROTOCOL FOR AM LAB REPLACEMENT ONLY  The patient does apply for the [facility] Adult ICU Electrolyte Replacment Protocol based on the criteria listed below:   1. Is GFR >/= 40 ml/min? Yes.    Patient's GFR today is >60 2. Is urine output >/= 0.5 ml/kg/hr for the last 6 hours? Yes.   Patient's UOP is 1.2 ml/kg/hr 3. Is BUN < 60 mg/dL? Yes.    Patient's BUN today is 37 4. Abnormal electrolyte(s): K+3.3 5. Ordered repletion with: Protocol 6. If a panic level lab has been reported, has the CCM MD in charge been notified? No..   Physician:  Tandy Gaw Hilliard 03/06/2017 5:31 AM

## 2017-03-06 NOTE — Progress Notes (Signed)
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 Per mother the patient has had worsening DOE, BL LE edema and orthopnea for several weeks. He also was feeling ill with productive cough and myalgias after recent contact with sick children with similar symptoms. Mom went to get Nyquil for pt and EMS was present at home upon her return.   Upon arrival to the ED pt repeatedly stated "I cant breathe, I cant breathe" and he denied CP per EDP. He was saturating 84% on RA on presentation with agitation and increased WOB. Placed on CPAP with worsening agitation however improvement of O2 sats. He was intubated in ED and PCCM asked to admit. CMET and CBC wnl. BNP 244. Initial trop negative. CXR with ETT and diffuse patchy BL infiltrates + cardiomegaly concerning for pulmonary edema.   Lines / Drains: ETT 4/29>> OGT 4/29>> PIV x2 4/29>> Left IJ 4/30>>>  Cultures: Bcx 4/29>> NGTD Sputum 4/29>>>NF RVP 4/29 > FLU A UA negative  Antibiotics: Ceftriaxone 4/29>> 5/4 Azithromycin 4/29>>>5/1 4/30 tamifli ( empiric)>>>5/4 4/30 vanc>>>5/2  Tests / Events: 4/29: Arrived to ED via EMS with SOB requiring intubation 4/29 CXR: +ETT. Diffuse patchy BL infiltrates  Overnight: -830cc, awake follows commands.  Attempted SBT but RR increased to 40's.  Still has moderate secretions.  Vital Signs: BP 102/68   Pulse 60   Temp 98.8 F (37.1 C) (Oral)   Resp 16   Ht 6' (1.829 m)   Wt (!) 142 kg (313 lb)   SpO2 96%   BMI 42.45 kg/m    Physical Examination: General: Morbidly obese. +ETT Neuro:  Follow commands, opens eyes and moves toes  HEENT: +ETT, +OG.  Neck:  Obese with ample girth.  Cardiovascular: RRR. No murmur appreciated.  Lungs: Clear bilaterally.  Pulls good volumes but RR up to 40's with SBT.  Mod secretions. Abdomen: Obese abdomen. Distended. +BS Musculoskeletal: Trace edema bilaterally to knees.  Skin: No cyanosis. Extremities warm and perfused.   Ventilator settings: Vent Mode: PRVC FiO2 (%):  [40 %-50 %] 40 % Set Rate:  [26 bmp-35 bmp] 26 bmp Vt Set:  [520 mL-620 mL] 620 mL PEEP:  [5 cmH20] 5 cmH20 Plateau Pressure:  [23 cmH20-33 cmH20] 29 cmH20  Labs and Imaging:  Reviewed.  Please refer to the Assessment and Plan section for relevant results.  DISCUSSION: 51 y/o M w/combined CHF, morbid obesity/OSA, HTN and DM2 here with acute respiratory failure requiring intubation 4/29. Noncompliant with medications for several months + several recent sick contacts. CXR with pulmonary infiltrates concerning for edema.   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  Not on lasix at home. BNP 243 NICM per [facility] 03/2016 Paroxysmal Afib - not on anticoag.  HTN, HLD  Troponin mildly elevated with stable trend 0.19 >>  0.27>>0.21. PLAN:  Daily weights Telemetry  RENAL AKI - from overdiuresis. Creatinine stable currently 1.3 >>1.3 >>2.00 Hypokalemia - s/p repletion. PLAN:   Monitor UOP and creatinine  IV Lasix 60 BID Repeat BMP this PM and in AM  GASTROINTESTINAL  GI PPx Nutrition PLAN:   Famotidine TF  HEMATOLOGIC DVT ppx PLAN:  Lovenox dosed per pharm for weight CBC in AM  INFECTIOUS ? CAP - on CTX x 5 days + Influenza A PLAN:   Ceftriaxone  and tamiflu through today 5/4, stop  ENDOCRINE Type 2 diabetes not on any DM meds at home. A1c 8.2 PLAN:   SSI  NEUROLOGIC Acute encephalopathy - due to sedation PLAN:   Prop infusion with fent/versed PRN RAAS goal 0 Wean sedation to facilitate SBT / extubation  Family updates:  None at bedside.  CC time: 30 min.   Rutherford Guys, [place] [facility] [place] [facility] Pager: [phone]  or [phone] 03/06/2017, 8:32 AM

## 2017-03-07 ENCOUNTER — Inpatient Hospital Stay (HOSPITAL_COMMUNITY): Payer: BLUE CROSS/BLUE SHIELD

## 2017-03-07 DIAGNOSIS — J101 Influenza due to other identified influenza virus with other respiratory manifestations: Secondary | ICD-10-CM

## 2017-03-07 LAB — BLOOD GAS, ARTERIAL
Acid-Base Excess: 7.2 mmol/L — ABNORMAL HIGH (ref 0.0–2.0)
Bicarbonate: 32.5 mmol/L — ABNORMAL HIGH (ref 20.0–28.0)
DRAWN BY: 437071
FIO2: 40
MECHVT: 620 mL
O2 Saturation: 93.9 %
PEEP: 5 cmH2O
Patient temperature: 99.6
RATE: 16 resp/min
pCO2 arterial: 59.6 mmHg — ABNORMAL HIGH (ref 32.0–48.0)
pH, Arterial: 7.358 (ref 7.350–7.450)
pO2, Arterial: 79.6 mmHg — ABNORMAL LOW (ref 83.0–108.0)

## 2017-03-07 LAB — GLUCOSE, CAPILLARY
GLUCOSE-CAPILLARY: 154 mg/dL — AB (ref 65–99)
Glucose-Capillary: 156 mg/dL — ABNORMAL HIGH (ref 65–99)
Glucose-Capillary: 171 mg/dL — ABNORMAL HIGH (ref 65–99)
Glucose-Capillary: 204 mg/dL — ABNORMAL HIGH (ref 65–99)
Glucose-Capillary: 222 mg/dL — ABNORMAL HIGH (ref 65–99)

## 2017-03-07 LAB — CBC
HEMATOCRIT: 42.8 % (ref 39.0–52.0)
HEMOGLOBIN: 13 g/dL (ref 13.0–17.0)
MCH: 30.2 pg (ref 26.0–34.0)
MCHC: 30.4 g/dL (ref 30.0–36.0)
MCV: 99.3 fL (ref 78.0–100.0)
Platelets: 183 10*3/uL (ref 150–400)
RBC: 4.31 MIL/uL (ref 4.22–5.81)
RDW: 15.1 % (ref 11.5–15.5)
WBC: 9.3 10*3/uL (ref 4.0–10.5)

## 2017-03-07 LAB — BASIC METABOLIC PANEL
Anion gap: 12 (ref 5–15)
BUN: 42 mg/dL — AB (ref 6–20)
CO2: 33 mmol/L — ABNORMAL HIGH (ref 22–32)
CREATININE: 1.43 mg/dL — AB (ref 0.61–1.24)
Calcium: 8.1 mg/dL — ABNORMAL LOW (ref 8.9–10.3)
Chloride: 100 mmol/L — ABNORMAL LOW (ref 101–111)
GFR, EST NON AFRICAN AMERICAN: 56 mL/min — AB (ref >60)
Glucose, Bld: 137 mg/dL — ABNORMAL HIGH (ref 65–99)
POTASSIUM: 3.8 mmol/L (ref 3.5–5.1)
SODIUM: 145 mmol/L (ref 135–145)

## 2017-03-07 LAB — MAGNESIUM: MAGNESIUM: 2.3 mg/dL (ref 1.7–2.4)

## 2017-03-07 LAB — PHOSPHORUS: PHOSPHORUS: 4.3 mg/dL (ref 2.5–4.6)

## 2017-03-07 MED ORDER — HYDRALAZINE HCL 20 MG/ML IJ SOLN
10.0000 mg | INTRAMUSCULAR | Status: DC | PRN
  Administered 2017-03-07: 10 mg via INTRAVENOUS
  Administered 2017-03-07 – 2017-03-10 (×5): 20 mg via INTRAVENOUS
  Administered 2017-03-12: 30 mg via INTRAVENOUS
  Administered 2017-03-12: 10 mg via INTRAVENOUS
  Administered 2017-03-16: 20 mg via INTRAVENOUS
  Filled 2017-03-07 (×8): qty 1
  Filled 2017-03-07: qty 2
  Filled 2017-03-07: qty 1

## 2017-03-07 MED ORDER — LACTULOSE 10 GM/15ML PO SOLN
30.0000 g | Freq: Once | ORAL | Status: AC
  Administered 2017-03-07: 30 g
  Filled 2017-03-07: qty 45

## 2017-03-07 MED ORDER — DEXMEDETOMIDINE HCL 200 MCG/2ML IV SOLN
0.4000 ug/kg/h | INTRAVENOUS | Status: DC
  Administered 2017-03-07 (×2): 1.2 ug/kg/h via INTRAVENOUS
  Filled 2017-03-07 (×2): qty 2

## 2017-03-07 MED ORDER — SODIUM CHLORIDE 0.9 % IV SOLN
0.4000 ug/kg/h | INTRAVENOUS | Status: DC
  Administered 2017-03-07: 0.8 ug/kg/h via INTRAVENOUS
  Administered 2017-03-07 (×2): 1.2 ug/kg/h via INTRAVENOUS
  Administered 2017-03-07: 0.8 ug/kg/h via INTRAVENOUS
  Administered 2017-03-08 (×2): 1.2 ug/kg/h via INTRAVENOUS
  Administered 2017-03-08 (×2): 0.8 ug/kg/h via INTRAVENOUS
  Administered 2017-03-08 (×2): 1.2 ug/kg/h via INTRAVENOUS
  Administered 2017-03-08: 1 ug/kg/h via INTRAVENOUS
  Administered 2017-03-08 – 2017-03-09 (×8): 1.2 ug/kg/h via INTRAVENOUS
  Filled 2017-03-07 (×21): qty 4

## 2017-03-07 MED ORDER — SODIUM CHLORIDE 0.9 % IV SOLN
0.4000 ug/kg/h | INTRAVENOUS | Status: DC
  Filled 2017-03-07: qty 2

## 2017-03-07 MED ORDER — BISACODYL 10 MG RE SUPP
10.0000 mg | Freq: Every day | RECTAL | Status: DC | PRN
  Administered 2017-03-07 – 2017-03-08 (×2): 10 mg via RECTAL
  Filled 2017-03-07 (×2): qty 1

## 2017-03-07 MED ORDER — FLEET ENEMA 7-19 GM/118ML RE ENEM
1.0000 | ENEMA | Freq: Every day | RECTAL | Status: DC | PRN
  Administered 2017-03-08: 1 via RECTAL
  Filled 2017-03-07 (×5): qty 1

## 2017-03-07 NOTE — Procedures (Signed)
Pt placed back on full support due to increased WOB & inc RR >40, RT will monitor

## 2017-03-07 NOTE — Progress Notes (Signed)
eLink Physician-Brief Progress Note Patient Name: Luis [name] DOB: Oct 28, 1966 MRN: [id number]   Date of Service  03/07/2017  HPI/Events of Note  Multiple issues: 1. Constipation and 2. Hypertension - BP = 172/111.  eICU Interventions  Will order: 1. Hydralazine 10 mg IV Q 4 hours PRN SBP > 160 or DBP > 100. 2. Dulcolax 10 mg Suppository Q day PRN moderate constipation. 3. Fleets Enema PR Q day PRN severe constipation.     Intervention Category Major Interventions: Hypertension - evaluation and management Intermediate Interventions: Other:  [name],Steven Dennard Nip 03/07/2017, 4:02 PM

## 2017-03-07 NOTE — Progress Notes (Signed)
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 Intubated for Acute respiratory failure 2nd to influenza A with pneumonia.  Lines / Drains: ETT 4/29>> OGT 4/29>> PIV x2 4/29>> Left IJ 4/30>>>  Cultures: Bcx 4/29>> NGTD Sputum 4/29>>>NF RVP 4/29 > FLU A UA negative  Antibiotics: Ceftriaxone 4/29>> 5/4 Azithromycin 4/29>>>5/1 4/30 tamifli ( empiric)>>>5/4 4/30 vanc>>>5/2  Tests / Events: 4/29: Arrived to ED via EMS with SOB requiring intubation   SUBj - Critically ill, sedated, intubated   Vital Signs: BP 90/60 (BP Location: Right Arm)   Pulse (!) 51   Temp 99.6 F (37.6 C) (Oral)   Resp 16   Ht 6' (1.829 m)   Wt (!) 344 lb (156 kg)   SpO2 98%   BMI 46.65 kg/m    Physical Examination: General: Morbidly obese. Oral ETT Neuro:  Follow commands off sedation, HEENT: no JVD, no pallor, icterus  Neck: Obese with ample girth.  Cardiovascular: RRR. No murmur appreciated.  Lungs: Clear bilaterally..  Mod secretions. Abdomen: Obese abdomen. Distended. +BS Musculoskeletal:1+ edema  Skin: No cyanosis. Extremities warm and perfused.   Ventilator settings: Vent Mode: PRVC FiO2 (%):  [40 %-50 %] 40 % Set Rate:  [26 bmp-35 bmp] 26 bmp Vt Set:  [520 mL-620 mL] 620 mL PEEP:  [5 cmH20] 5 cmH20 Plateau Pressure:  [23 cmH20-33 cmH20] 29 cmH20  Labs and Imaging:  Reviewed.    DISCUSSION: 51 y/o M w/combined CHF, morbid obesity/OSA, HTN and DM2 here with acute respiratory failure requiring intubation 4/29. Noncompliant with medications for several months + several recent sick contacts. CXR with pulmonary infiltrates concerning for edema.   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  Not on lasix at home. BNP 243 NICM per [facility] 03/2016 Paroxysmal Afib - not on anticoag.  HTN, HLD  Troponin mildly elevated with stable trend 0.19 >> 0.27>>0.21. PLAN:  Daily weights Telemetry  RENAL AKI - from overdiuresis. Creatinine stable currently 1.3 >>1.3 >>2.00 Hypokalemia - s/p repletion. PLAN:   Monitor UOP, lytes and creatinine  IV Lasix 60 BID   GASTROINTESTINAL  Nutrition Constipation - no BM since admit PLAN:   Famotidine Ct TF Lactulose x1  HEMATOLOGIC DVT ppx PLAN:  Lovenox dosed per pharm for weight CBC in AM  INFECTIOUS  CAP  + Influenza A PLAN:   Ceftriaxone and tamiflu x5ds  ENDOCRINE Type 2 diabetes not on any DM meds at home. A1c 8.2 PLAN:   SSI  NEUROLOGIC Acute encephalopathy - due to sedation PLAN:   Dc Prop - use precedex gtt fent/versed PRN RAAS goal 0   Family updates:  None at bedside.  CC time: 35 min.  Cyril Mourning MD. Tonny Bollman. [place] [facility] Pager [phone] If no response call [phone]     03/07/2017, 8:49 AM

## 2017-03-08 ENCOUNTER — Inpatient Hospital Stay (HOSPITAL_COMMUNITY): Payer: BLUE CROSS/BLUE SHIELD

## 2017-03-08 LAB — GLUCOSE, CAPILLARY
GLUCOSE-CAPILLARY: 198 mg/dL — AB (ref 65–99)
GLUCOSE-CAPILLARY: 259 mg/dL — AB (ref 65–99)
GLUCOSE-CAPILLARY: 335 mg/dL — AB (ref 65–99)
GLUCOSE-CAPILLARY: 346 mg/dL — AB (ref 65–99)
Glucose-Capillary: 290 mg/dL — ABNORMAL HIGH (ref 65–99)
Glucose-Capillary: 328 mg/dL — ABNORMAL HIGH (ref 65–99)

## 2017-03-08 LAB — URINALYSIS, ROUTINE W REFLEX MICROSCOPIC
BILIRUBIN URINE: NEGATIVE
Glucose, UA: NEGATIVE mg/dL
KETONES UR: NEGATIVE mg/dL
Leukocytes, UA: NEGATIVE
NITRITE: NEGATIVE
PROTEIN: 30 mg/dL — AB
SQUAMOUS EPITHELIAL / LPF: NONE SEEN
Specific Gravity, Urine: 1.02 (ref 1.005–1.030)
pH: 5 (ref 5.0–8.0)

## 2017-03-08 LAB — CBC
HEMATOCRIT: 46.3 % (ref 39.0–52.0)
HEMOGLOBIN: 14.4 g/dL (ref 13.0–17.0)
MCH: 30.6 pg (ref 26.0–34.0)
MCHC: 31.1 g/dL (ref 30.0–36.0)
MCV: 98.3 fL (ref 78.0–100.0)
Platelets: 169 10*3/uL (ref 150–400)
RBC: 4.71 MIL/uL (ref 4.22–5.81)
RDW: 14.6 % (ref 11.5–15.5)
WBC: 13.8 10*3/uL — ABNORMAL HIGH (ref 4.0–10.5)

## 2017-03-08 LAB — BASIC METABOLIC PANEL
ANION GAP: 10 (ref 5–15)
BUN: 38 mg/dL — ABNORMAL HIGH (ref 6–20)
CALCIUM: 8.3 mg/dL — AB (ref 8.9–10.3)
CO2: 33 mmol/L — ABNORMAL HIGH (ref 22–32)
CREATININE: 1.25 mg/dL — AB (ref 0.61–1.24)
Chloride: 102 mmol/L (ref 101–111)
GFR calc Af Amer: >60 mL/min (ref >60)
Glucose, Bld: 217 mg/dL — ABNORMAL HIGH (ref 65–99)
Potassium: 3.6 mmol/L (ref 3.5–5.1)
Sodium: 145 mmol/L (ref 135–145)

## 2017-03-08 LAB — MAGNESIUM: Magnesium: 2.1 mg/dL (ref 1.7–2.4)

## 2017-03-08 LAB — PHOSPHORUS: Phosphorus: 2.9 mg/dL (ref 2.5–4.6)

## 2017-03-08 MED ORDER — PIPERACILLIN-TAZOBACTAM 3.375 G IVPB
3.3750 g | Freq: Three times a day (TID) | INTRAVENOUS | Status: AC
  Administered 2017-03-09 – 2017-03-14 (×18): 3.375 g via INTRAVENOUS
  Filled 2017-03-08 (×20): qty 50

## 2017-03-08 MED ORDER — MIDAZOLAM HCL 2 MG/2ML IJ SOLN
2.0000 mg | INTRAMUSCULAR | Status: DC | PRN
  Administered 2017-03-08 – 2017-03-11 (×15): 2 mg via INTRAVENOUS
  Filled 2017-03-08 (×15): qty 2

## 2017-03-08 MED ORDER — VANCOMYCIN HCL 10 G IV SOLR
1500.0000 mg | Freq: Two times a day (BID) | INTRAVENOUS | Status: DC
  Administered 2017-03-09 – 2017-03-10 (×4): 1500 mg via INTRAVENOUS
  Filled 2017-03-08 (×6): qty 1500

## 2017-03-08 MED ORDER — DEXTROSE-NACL 5-0.9 % IV SOLN
INTRAVENOUS | Status: DC
  Administered 2017-03-09: 125 mL/h via INTRAVENOUS
  Administered 2017-03-09 – 2017-03-10 (×2): via INTRAVENOUS

## 2017-03-08 MED ORDER — LACTULOSE 10 GM/15ML PO SOLN
30.0000 g | Freq: Once | ORAL | Status: AC
  Administered 2017-03-08: 30 g
  Filled 2017-03-08: qty 45

## 2017-03-08 MED ORDER — VANCOMYCIN HCL 10 G IV SOLR
2000.0000 mg | INTRAVENOUS | Status: AC
  Administered 2017-03-08: 2000 mg via INTRAVENOUS
  Filled 2017-03-08: qty 2000

## 2017-03-08 MED ORDER — MILK AND MOLASSES ENEMA
1.0000 | Freq: Once | RECTAL | Status: AC
  Administered 2017-03-08: 250 mL via RECTAL
  Filled 2017-03-08: qty 250

## 2017-03-08 MED ORDER — INSULIN GLARGINE 100 UNIT/ML ~~LOC~~ SOLN
10.0000 [IU] | Freq: Every day | SUBCUTANEOUS | Status: DC
  Administered 2017-03-08 – 2017-03-17 (×10): 10 [IU] via SUBCUTANEOUS
  Filled 2017-03-08 (×12): qty 0.1

## 2017-03-08 MED ORDER — PIPERACILLIN-TAZOBACTAM 3.375 G IVPB 30 MIN
3.3750 g | INTRAVENOUS | Status: AC
  Administered 2017-03-08: 3.375 g via INTRAVENOUS
  Filled 2017-03-08: qty 50

## 2017-03-08 MED ORDER — DOCUSATE SODIUM 50 MG/5ML PO LIQD: 100.0000 mg | Freq: Two times a day (BID) | ORAL | Status: DC | PRN

## 2017-03-08 NOTE — Progress Notes (Signed)
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 Intubated for Acute respiratory failure 2nd to influenza A with pneumonia.  Lines / Drains: ETT 4/29>> OGT 4/29>> PIV x2 4/29>> Left IJ 4/30>>>  Cultures: Bcx 4/29>> NGTD Sputum 4/29>>>NF RVP 4/29 > FLU A UA negative  Antibiotics: Ceftriaxone 4/29>> 5/4 Azithromycin 4/29>>>5/1 4/30 tamifli ( empiric)>>>5/4 4/30 vanc>>>5/2  Tests / Events: 4/29: Arrived to ED via EMS with SOB requiring intubation   SUBj -  Remains Critically ill, sedated, intubated Not tolerating wean Denies pain Neg 5L    Vital Signs: BP 125/82 Comment: post hydralzine  Pulse 86   Temp (!) 100.4 F (38 C) (Oral)   Resp (!) 29   Ht 6' (1.829 m)   Wt (!) 337 lb 4.9 oz (153 kg)   SpO2 98%   BMI 45.75 kg/m    Physical Examination: General: Morbidly obese. Oral ETT Neuro:  Follow commands , very weak HEENT: no JVD, no pallor, icterus  Neck: Obese with ample girth.  Cardiovascular: RRR. No murmur appreciated.  Lungs: Clear bilaterally..  Mod secretions. Abdomen: Obese abdomen. Distended. +BS Musculoskeletal:1+ edema  Skin: No cyanosis. Extremities warm and perfused.     Labs and Imaging:  Reviewed.    DISCUSSION: 51 y/o M w/combined CHF, morbid obesity/OSA, HTN and DM2 here with influenza & acute respiratory failure requiring intubation 4/29. Noncompliant with medications for several months + several recent sick contacts.  Poor progress with wean, Diuresing well   PULMONARY Acute respiratory failure w/ diffuse patchy pulmonary infiltrates 2/2 Influenza A with possible secondary CAP Tobacco abuse OSA PLAN:   Low TV ventilation, 6cc/kg Daily SBT with goal  extubation to BiPAP once secretions decreased Ct IV solumedrol daily    CARDIOVASCULAR Acute  diastolic CHF (LVEF 50-55%, g1dd 03/03/2017).  Not on lasix at home. BNP 243 NICM per [facility] 03/2016 Paroxysmal Afib - not on anticoag.  HTN, HLD  Troponin mildly elevated , peak 0.27 PLAN:  Daily weights Telemetry  RENAL AKI - from overdiuresis. Creatinine stable currently 1.3 >>1.3 >>2.00 Hypokalemia - s/p repletion. PLAN:   Monitor UOP, lytes and creatinine  Ct IV Lasix 60 BID   GASTROINTESTINAL  Nutrition Constipation - no BM since admit PLAN:   Famotidine Ct TF Rpt Lactulose x1, Fleets if no response  HEMATOLOGIC DVT ppx PLAN:  Lovenox dosed per pharm for weight CBC in AM  INFECTIOUS  CAP  + Influenza A PLAN:   Ceftriaxone and tamiflu x5ds  ENDOCRINE Type 2 diabetes not on any DM meds at home. A1c 8.2 PLAN:   SSI Add lantus 10 U  NEUROLOGIC Acute encephalopathy - due to sedation PLAN:   Dc Prop - use precedex gtt fent/versed PRN RAAS goal 0   Family updates:  None at bedside.  CC time: 32 min.  Cyril Mourning MD. Tonny Bollman. [place] [facility] Pager [phone] If no response call [phone]     03/08/2017, 10:54 AM

## 2017-03-08 NOTE — Progress Notes (Addendum)
Patients mother at bedside visiting. Patient became very agitated when he saw mom. Pts heart rate increased to 160s, RR 40-50s, hands went to ET tube. RN was able to intervene and get his hands away from ET tube but pt continuously agitated. Unable to be redirected. Mother became upset that patient was intubated and that patient was trying to removed tube "to talk" and to tell his mother about "all the bad things" we are doing to the patient. Mother attempting to free patients hands so that patient can get to tube. Mother upset and stepped into hallway and began yelling that her son wanted to tell her what had been happening to him in the hospital. Mother told to step out into waiting area because she was interfering with patient care and safety. Sedation increased. Mittens re-applied. Cold compress applied to forehead. Safety and care emphasized to patient. Will cont to monitor.

## 2017-03-08 NOTE — Progress Notes (Signed)
eLink Physician-Brief Progress Note Patient Name: Luis [name] DOB: 11-Dec-1965 MRN: [id number]   Date of Service  03/08/2017  HPI/Events of Note  Fever to 103.0 F - Abdomen distended. No BM in several days. Review of AM CXT >> atelectasis and pulmonary congestion.   eICU Interventions  Will order: 1. Blood cultures X 2. 2. UA. 3. Vancomycin and Zosyn per pharmacy consult. 4. Hold tube feeds. 5. NGT to LIS. 6. CBC, BMP, Amylase and Lipase now.  7. Portable Abdominal film now.  8. D5 0.9 NaCl to run IV at 125 mL/hour. 9. Cooling blanket.      Intervention Category Major Interventions: Infection - evaluation and management  [name],Steven Eugene 03/08/2017, 10:16 PM

## 2017-03-08 NOTE — Progress Notes (Signed)
Pharmacy Antibiotic Note  Luis [name] is a 51 y.o. male admitted on 03/01/2017 with fever up to 103.  Pharmacy has been consulted for Vancomycin and Zosyn dosing.  Plan: Zosyn 3.375gm IV now over 30 min then 3.375gm IV q8h - subsequent doses over 4 hours Vancomycin 2000gm IV now then 1500mg  IV q12h  Will f/u micro data, renal function, and pt's clinical condition Vanc trough at Css   Height: 6' (182.9 cm) Weight: (!) 337 lb 4.9 oz (153 kg) IBW/kg (Calculated) : 77.6  Temp (24hrs), Avg:100.7 F (38.2 C), Min:99 F (37.2 C), Max:103 F (39.4 C)   Recent Labs Lab 03/02/17 1600  03/03/17 1134  03/05/17 0448 03/06/17 0400 03/06/17 1219 03/07/17 0350 03/08/17 0339  WBC  --   --   --   --  5.4 7.1  --  9.3 13.8*  CREATININE  --   < >  --   < > 1.35* 1.32* 1.35* 1.43* 1.25*  LATICACIDVEN 1.2  --   --   --   --   --   --   --   --   VANCOTROUGH  --   --  13*  --   --   --   --   --   --   < > = values in this interval not displayed.  Estimated Creatinine Clearance: 109 mL/min (A) (by C-G formula based on SCr of 1.25 mg/dL (H)).    Allergies  Allergen Reactions  . Demerol [Meperidine] Itching  . Morphine And Related Itching    Antimicrobials this admission: Azithro 4/29 > 5/2 Ceftriaxone 4/29 > 5/4 Vancomycin 4/30> 5/2; restart 5/6 Tamiflu 4/30>5/4  Zosyn 5/6>   Dose adjustments this admission: n/a  Microbiology results: 4/29 MRSA PCR: negative 4/29 +Flu A 4/29 Trach asp: negative 4/29 BCx x 2: ngtd  Thank you for allowing pharmacy to be a part of this patient's care.  Christoper Fabian, PharmD, BCPS Clinical pharmacist, pager [phone] 03/08/2017 10:24 PM

## 2017-03-08 NOTE — Progress Notes (Signed)
eLink Physician-Brief Progress Note Patient Name: Witt Kamphuis DOB: 07-28-1966 MRN: [id number]   Date of Service  03/08/2017  HPI/Events of Note  Constipation  eICU Interventions  Will order: 1. Colace liquid 100 mg per tube BID PRN constipation 2. Lactulose 30 gm per tube X 1 now.  3. Milk and Molasses Enema X 1 now.      Intervention Category Intermediate Interventions: Other:  Lenell Antu 03/08/2017, 8:17 PM

## 2017-03-09 ENCOUNTER — Inpatient Hospital Stay (HOSPITAL_COMMUNITY): Payer: BLUE CROSS/BLUE SHIELD

## 2017-03-09 DIAGNOSIS — J95851 Ventilator associated pneumonia: Secondary | ICD-10-CM

## 2017-03-09 DIAGNOSIS — R140 Abdominal distension (gaseous): Secondary | ICD-10-CM

## 2017-03-09 LAB — CBC WITH DIFFERENTIAL/PLATELET
BASOS PCT: 0 %
Basophils Absolute: 0 10*3/uL (ref 0.0–0.1)
EOS ABS: 0 10*3/uL (ref 0.0–0.7)
EOS PCT: 0 %
HCT: 47.2 % (ref 39.0–52.0)
Hemoglobin: 14.8 g/dL (ref 13.0–17.0)
LYMPHS PCT: 18 %
Lymphs Abs: 2.6 10*3/uL (ref 0.7–4.0)
MCH: 30.7 pg (ref 26.0–34.0)
MCHC: 31.4 g/dL (ref 30.0–36.0)
MCV: 97.9 fL (ref 78.0–100.0)
MONOS PCT: 10 %
Monocytes Absolute: 1.5 10*3/uL — ABNORMAL HIGH (ref 0.1–1.0)
NEUTROS ABS: 10.5 10*3/uL — AB (ref 1.7–7.7)
NEUTROS PCT: 72 %
PLATELETS: 179 10*3/uL (ref 150–400)
RBC: 4.82 MIL/uL (ref 4.22–5.81)
RDW: 14.8 % (ref 11.5–15.5)
WBC: 14.6 10*3/uL — ABNORMAL HIGH (ref 4.0–10.5)

## 2017-03-09 LAB — BASIC METABOLIC PANEL
Anion gap: 9 (ref 5–15)
Anion gap: 8 (ref 5–15)
BUN: 37 mg/dL — AB (ref 6–20)
BUN: 33 mg/dL — ABNORMAL HIGH (ref 6–20)
CALCIUM: 8.2 mg/dL — AB (ref 8.9–10.3)
CALCIUM: 8.2 mg/dL — AB (ref 8.9–10.3)
CO2: 32 mmol/L (ref 22–32)
CO2: 31 mmol/L (ref 22–32)
CREATININE: 1.3 mg/dL — AB (ref 0.61–1.24)
Chloride: 104 mmol/L (ref 101–111)
Chloride: 108 mmol/L (ref 101–111)
Creatinine, Ser: 1.39 mg/dL — ABNORMAL HIGH (ref 0.61–1.24)
GFR calc Af Amer: >60 mL/min (ref >60)
GFR calc non Af Amer: >60 mL/min (ref >60)
GFR, EST NON AFRICAN AMERICAN: 58 mL/min — AB (ref >60)
Glucose, Bld: 281 mg/dL — ABNORMAL HIGH (ref 65–99)
Glucose, Bld: 204 mg/dL — ABNORMAL HIGH (ref 65–99)
POTASSIUM: 3.7 mmol/L (ref 3.5–5.1)
Potassium: 3.4 mmol/L — ABNORMAL LOW (ref 3.5–5.1)
SODIUM: 145 mmol/L (ref 135–145)
SODIUM: 147 mmol/L — AB (ref 135–145)

## 2017-03-09 LAB — GLUCOSE, CAPILLARY
GLUCOSE-CAPILLARY: 259 mg/dL — AB (ref 65–99)
GLUCOSE-CAPILLARY: 194 mg/dL — AB (ref 65–99)
GLUCOSE-CAPILLARY: 247 mg/dL — AB (ref 65–99)
Glucose-Capillary: 197 mg/dL — ABNORMAL HIGH (ref 65–99)
Glucose-Capillary: 164 mg/dL — ABNORMAL HIGH (ref 65–99)
Glucose-Capillary: 136 mg/dL — ABNORMAL HIGH (ref 65–99)
Glucose-Capillary: 217 mg/dL — ABNORMAL HIGH (ref 65–99)

## 2017-03-09 LAB — PHOSPHORUS: Phosphorus: 2.8 mg/dL (ref 2.5–4.6)

## 2017-03-09 LAB — MAGNESIUM: Magnesium: 2.2 mg/dL (ref 1.7–2.4)

## 2017-03-09 LAB — CBC
HCT: 45.9 % (ref 39.0–52.0)
Hemoglobin: 14.2 g/dL (ref 13.0–17.0)
MCH: 30.5 pg (ref 26.0–34.0)
MCHC: 30.9 g/dL (ref 30.0–36.0)
MCV: 98.7 fL (ref 78.0–100.0)
Platelets: 180 10*3/uL (ref 150–400)
RBC: 4.65 MIL/uL (ref 4.22–5.81)
RDW: 14.8 % (ref 11.5–15.5)
WBC: 14.3 10*3/uL — AB (ref 4.0–10.5)

## 2017-03-09 LAB — PROCALCITONIN: Procalcitonin: 1 ng/mL

## 2017-03-09 LAB — AMYLASE: Amylase: 82 U/L (ref 28–100)

## 2017-03-09 LAB — LIPASE, BLOOD: Lipase: 19 U/L (ref 11–51)

## 2017-03-09 LAB — TRIGLYCERIDES: Triglycerides: 238 mg/dL — ABNORMAL HIGH (ref <150)

## 2017-03-09 MED ORDER — PANTOPRAZOLE SODIUM 40 MG IV SOLR
40.0000 mg | INTRAVENOUS | Status: DC
  Administered 2017-03-09 – 2017-03-10 (×2): 40 mg via INTRAVENOUS
  Filled 2017-03-09 (×3): qty 40

## 2017-03-09 MED ORDER — DOCUSATE SODIUM 50 MG/5ML PO LIQD
100.0000 mg | Freq: Two times a day (BID) | ORAL | Status: DC
  Administered 2017-03-09 – 2017-03-10 (×4): 100 mg
  Filled 2017-03-09 (×5): qty 10

## 2017-03-09 MED ORDER — BUDESONIDE 0.5 MG/2ML IN SUSP
0.5000 mg | Freq: Two times a day (BID) | RESPIRATORY_TRACT | Status: DC
  Administered 2017-03-09 – 2017-03-17 (×16): 0.5 mg via RESPIRATORY_TRACT
  Filled 2017-03-09 (×19): qty 2

## 2017-03-09 MED ORDER — SODIUM CHLORIDE 0.9 % IV BOLUS (SEPSIS): 500.0000 mL | Freq: Once | INTRAVENOUS | Status: DC

## 2017-03-09 MED ORDER — IPRATROPIUM-ALBUTEROL 0.5-2.5 (3) MG/3ML IN SOLN
3.0000 mL | Freq: Four times a day (QID) | RESPIRATORY_TRACT | Status: DC
  Administered 2017-03-09 – 2017-03-11 (×10): 3 mL via RESPIRATORY_TRACT
  Filled 2017-03-09 (×10): qty 3

## 2017-03-09 MED ORDER — PROPOFOL 1000 MG/100ML IV EMUL
5.0000 ug/kg/min | INTRAVENOUS | Status: DC
  Administered 2017-03-09: 20 ug/kg/min via INTRAVENOUS
  Administered 2017-03-09: 15 ug/kg/min via INTRAVENOUS
  Administered 2017-03-09 – 2017-03-11 (×8): 20 ug/kg/min via INTRAVENOUS
  Administered 2017-03-11: 30 ug/kg/min via INTRAVENOUS
  Administered 2017-03-11 – 2017-03-12 (×2): 20 ug/kg/min via INTRAVENOUS
  Filled 2017-03-09 (×13): qty 100

## 2017-03-09 MED ORDER — POTASSIUM CHLORIDE 10 MEQ/50ML IV SOLN
10.0000 meq | INTRAVENOUS | Status: AC
  Administered 2017-03-09 (×4): 10 meq via INTRAVENOUS
  Filled 2017-03-09 (×4): qty 50

## 2017-03-09 MED ORDER — METOCLOPRAMIDE HCL 5 MG/ML IJ SOLN
10.0000 mg | Freq: Three times a day (TID) | INTRAMUSCULAR | Status: DC
  Administered 2017-03-09 – 2017-03-17 (×24): 10 mg via INTRAVENOUS
  Filled 2017-03-09 (×26): qty 2

## 2017-03-09 NOTE — Progress Notes (Signed)
Pts mother at bedside asking about patients condition and writing down pts medications. All questions answered. Emotional support given to mother. Pt password confirmed and pts mother told to call unit with any questions or concerns. Will continue to monitor.

## 2017-03-09 NOTE — Progress Notes (Signed)
Nutrition Follow-up  DOCUMENTATION CODES:   Morbid obesity  INTERVENTION:    Vital High Protein at goal rate of 85 ml/h (2040 ml per day) and Prostat 30 ml BID   TF regimen to provide 2240 kcals, 208 gm protein, 1705 ml free water daily   NUTRITION DIAGNOSIS:   Inadequate oral intake related to inability to eat as evidenced by NPO status. Ongoing.   GOAL:   Provide needs based on ASPEN/SCCM guidelines Progressing.   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 Per mother the patient has had worsening DOE, BL LE edema and orthopnea for several weeks  Spoke with bedside RN. TF stopped last night due to ileus. No bm since admission. Pt with abd distention.  Per MD/RN plan to start SMOG, erythromycin, and cut down on fentanyl   Diet Order:  Diet NPO time specified  Skin:  Reviewed, no issues  Last BM:  PTA  Height:   Ht Readings from Last 1 Encounters:  03/05/17 6' (1.829 m)    Weight:   Wt Readings from Last 1 Encounters:  03/09/17 (!) 324 lb 1.2 oz (147 kg)    Ideal Body Weight:  81 kg  BMI:  Body mass index is 43.95 kg/m.  Estimated Nutritional Needs:   Kcal:  2023-3435  Protein:  >/= 202 gm  Fluid:  per MD  EDUCATION NEEDS:   No education needs identified at this time  Kendell Bane RD, LDN, CNSC [phone] Pager [phone] After Hours Pager

## 2017-03-09 NOTE — Progress Notes (Signed)
Sputum collected and sent to the lab 

## 2017-03-09 NOTE — Progress Notes (Signed)
Pt placed back on full support. He was having periods of increased RR and then began to go apneic.  RT will continue to monitor.

## 2017-03-09 NOTE — Progress Notes (Signed)
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 Intubated for Acute respiratory failure 2nd to influenza A with pneumonia.  Lines / Drains: ETT 4/29>> OGT 4/29>> PIV x2 4/29>> Left IJ 4/30>>  Cultures: Bcx 4/29>> NGTD Sputum 4/29>>>NF RVP 4/29 > FLU A UA negative Blood 5/6 > Trache 5/7 >  Antibiotics: Ceftriaxone 4/29>> 5/4 Azithromycin 4/29>>>5/1 4/30 tamifli ( empiric)>>>5/4 4/30 vanc>>>5/2; 5/6 >  Zosyn 5/6 >  Tests / Events: 4/29: Arrived to ED via EMS with SOB requiring intubation.    SUBJECTIVE:   Remains Critically ill, Sedated, Intubated Doing daily wean but fails.  Did 90 mins today of PST and got tachypneic, elevated BP. Bck on the vent Fever overnight. Restarted on abx and pancultured.    Vital Signs: BP (!) 163/105   Pulse 88   Temp 99.5 F (37.5 C) (Oral)   Resp (!) 24   Ht 6' (1.829 m)   Wt (!) 147 kg (324 lb 1.2 oz)   SpO2 98%   BMI 43.95 kg/m    Physical Examination: General: Morbidly obese. Oral ETT Neuro:  Follow commands , very weak. Sedated at time of my exam.  HEENT: no JVD, no pallor, icterus  Neck: Obese with ample girth.  Cardiovascular: RRR. No murmur appreciated.  Lungs: Crackles bibasilar.  Abdomen: Obese abdomen. Distended. hypoactive Musculoskeletal:+ 2 edema  Skin: No cyanosis. Extremities warm and perfused.    CBC Recent Labs     03/08/17  0339  03/08/17  2242  03/09/17  0431  WBC  13.8*  14.6*  14.3*  HGB  14.4  14.8  14.2  HCT  46.3  47.2  45.9  PLT  169  179  180    Coag's No results for input(s): APTT, INR in the last 72 hours.  BMET Recent Labs     03/08/17  0339  03/08/17  2242  03/09/17  0431  NA  145  145  147*  K   3.6  3.7  3.4*  CL  102  104  108  CO2  33*  32  31  BUN  38*  37*  33*  CREATININE  1.25*  1.39*  1.30*  GLUCOSE  217*  281*  204*    Electrolytes Recent Labs     03/07/17  0350  03/08/17  0339  03/08/17  2242  03/09/17  0431  CALCIUM  8.1*  8.3*  8.2*  8.2*  MG  2.3  2.1   --   2.2  PHOS  4.3  2.9   --   2.8    Sepsis Markers No results for input(s): PROCALCITON, O2SATVEN in the last 72 hours.  Invalid input(s): LACTICACIDVEN  ABG Recent Labs     03/07/17  0325  PHART  7.358  PCO2ART  59.6*  PO2ART  79.6*    Liver Enzymes No results for input(s): AST, ALT, ALKPHOS, BILITOT, ALBUMIN in the last 72 hours.  Cardiac Enzymes No results for input(s): TROPONINI, PROBNP in the last 72 hours.  Glucose Recent Labs     03/08/17  1204  03/08/17  1656  03/08/17  2129  03/09/17  0111  03/09/17  0512  03/09/17  0819  GLUCAP  335*  328*  346*  197*  194*  164*    Imaging Dg Chest Port 1 View  Result Date: 03/09/2017 CLINICAL DATA:  Endotracheal tube, acute respiratory failure. EXAM: PORTABLE CHEST 1 VIEW COMPARISON:  03/08/2017. FINDINGS: Endotracheal tube terminates approximately 5 cm above the carina. Nasogastric tube is followed into the stomach with the tip projecting beyond the inferior margin of the image. Left IJ central line tip projects over the SVC. Heart is enlarged. Lungs are low in volume with diffuse mixed interstitial and airspace opacification, increased. No definite pleural fluid. IMPRESSION: Increasing mixed interstitial and airspace opacification, indicative of edema. Electronically Signed   By: Leanna Battles M.D.   On: 03/09/2017 07:54   Dg Chest Port 1 View  Result Date: 03/08/2017 CLINICAL DATA:  Acute respiratory failure EXAM: PORTABLE CHEST 1 VIEW COMPARISON:  03/07/2017 FINDINGS: ET tube tip is above the carina. There is a left IJ catheter with tip over the projection of the distal SVC. NG tube is in place with tip below the field of view. Normal  heart size. Atelectasis noted in the right base. Pulmonary vascular congestion unchanged. IMPRESSION: 1. No change in aeration to the  lungs compared with previous exam. Electronically Signed   By: Signa Kell M.D.   On: 03/08/2017 07:24   Dg Abd Portable 1v  Result Date: 03/08/2017 CLINICAL DATA:  Acute onset of generalized abdominal distention. Initial encounter. EXAM: PORTABLE ABDOMEN - 1 VIEW COMPARISON:  None. FINDINGS: There is diffuse distention of small and large bowel loops, concerning for ileus. The enteric tube is noted overlying the body of the stomach. No free intra-abdominal air is seen, though evaluation for free air is limited on supine images. No acute osseous abnormalities are identified. Mild degenerative change is noted along the lower thoracic spine. IMPRESSION: Diffuse distention of small and large bowel loops likely reflects ileus. Enteric tube noted overlying the body of the stomach. No free intra-abdominal air seen. Electronically Signed   By: Roanna Raider M.D.   On: 03/08/2017 23:46     DISCUSSION: 51 y/o M w/combined CHF, morbid obesity/OSA, HTN and DM2 here with influenza & acute respiratory failure requiring intubation 4/29. Noncompliant with medications for several months + several recent sick contacts.  Poor progress with wean, Diuresing well. New fever and possible VAP on 5/7.    PULMONARY Acute Hypoxemic Hypercapneic Respiratory Failure 2/2 CAP (initially) + Influenza + Acute Pulm edema + VAP (5/7) + OSA (noncompliant) Tobacco abuse PLAN:   Low TV ventilation, 6cc/kg Daily SBT with goal extubation to BiPAP once secretions decreased.  Failing daily PST. May need a tracheostomy Steroid taper  Pulmicort + Duoneb Re started Abx for possible VAP on 5/7. Check PCT.  Cont diuresis   CARDIOVASCULAR Acute  diastolic CHF (LVEF 50-55%, g1dd 03/03/2017). NICM  ([facility], 03/2016) Acute Pulmonary edema Demand ischemia Paroxysmal Afib - not on anticoag 2/2 non  compliance HTN, HLD  PLAN:  Cont diuresis Daily weights Telemetry   RENAL AKI - from overdiuresis. Creatinine stable PLAN:   Cont daily lasix Monitor UOP, lytes and creatinine    GASTROINTESTINAL  Ileus Constipation - no BM since admit PLAN:   Plan to start SMOG, Erythromycin per feeding tube.  Try to cut down fentanyl.  No response to enemas, suppositories Famotidine BID   HEMATOLOGIC DVT ppx PLAN:  Lovenox dosed per pharm for weight    INFECTIOUS CAP + Influenza A Suspect RLL VAP PLAN:   On Zosyn  and Vanc Panculture Check PCT   ENDOCRINE Type 2 diabetes not on any DM meds at home. A1c 8.2 PLAN:   SSI On Lantus 10 U   NEUROLOGIC Acute encephalopathy - due to sedation PLAN:   Mother did not like Propofol. Switched to precedex drip + versed pushes on 5/6 and seems to be doing OK.  Also on fentanyl drip, try to cut donw 2/2 ileus.  RAAS goal 0   Family updates:  None at bedside.  CC time: 41  J. Alexis Frock, MD 03/09/2017, 11:20 AM [place] [facility] Pager [phone] After 3 pm or if no answer, call [phone]

## 2017-03-09 NOTE — Progress Notes (Signed)
eLink Physician-Brief Progress Note Patient Name: Luis [name] DOB: Jan 11, 1966 MRN: [id number]   Date of Service  03/09/2017  HPI/Events of Note  Drop in Bp after sedation  eICU Interventions  Bolus If not improved then dc prop     Intervention Category Intermediate Interventions: Hypovolemia - evaluation and management  Nelda Bucks. 03/09/2017, 10:20 PM

## 2017-03-10 ENCOUNTER — Inpatient Hospital Stay (HOSPITAL_COMMUNITY): Payer: BLUE CROSS/BLUE SHIELD

## 2017-03-10 DIAGNOSIS — J189 Pneumonia, unspecified organism: Secondary | ICD-10-CM

## 2017-03-10 DIAGNOSIS — I5041 Acute combined systolic (congestive) and diastolic (congestive) heart failure: Secondary | ICD-10-CM

## 2017-03-10 DIAGNOSIS — Y95 Nosocomial condition: Secondary | ICD-10-CM

## 2017-03-10 LAB — BASIC METABOLIC PANEL
ANION GAP: 13 (ref 5–15)
BUN: 35 mg/dL — AB (ref 6–20)
CHLORIDE: 106 mmol/L (ref 101–111)
CO2: 28 mmol/L (ref 22–32)
Calcium: 8.5 mg/dL — ABNORMAL LOW (ref 8.9–10.3)
Creatinine, Ser: 1.49 mg/dL — ABNORMAL HIGH (ref 0.61–1.24)
GFR, EST NON AFRICAN AMERICAN: 53 mL/min — AB (ref >60)
Glucose, Bld: 191 mg/dL — ABNORMAL HIGH (ref 65–99)
POTASSIUM: 3.3 mmol/L — AB (ref 3.5–5.1)
SODIUM: 147 mmol/L — AB (ref 135–145)

## 2017-03-10 LAB — GLUCOSE, CAPILLARY
GLUCOSE-CAPILLARY: 188 mg/dL — AB (ref 65–99)
GLUCOSE-CAPILLARY: 158 mg/dL — AB (ref 65–99)
Glucose-Capillary: 195 mg/dL — ABNORMAL HIGH (ref 65–99)
Glucose-Capillary: 164 mg/dL — ABNORMAL HIGH (ref 65–99)
Glucose-Capillary: 176 mg/dL — ABNORMAL HIGH (ref 65–99)
Glucose-Capillary: 158 mg/dL — ABNORMAL HIGH (ref 65–99)

## 2017-03-10 LAB — CBC
HCT: 46.5 % (ref 39.0–52.0)
HEMOGLOBIN: 14.7 g/dL (ref 13.0–17.0)
MCH: 31 pg (ref 26.0–34.0)
MCHC: 31.6 g/dL (ref 30.0–36.0)
MCV: 98.1 fL (ref 78.0–100.0)
Platelets: 215 10*3/uL (ref 150–400)
RBC: 4.74 MIL/uL (ref 4.22–5.81)
RDW: 14.6 % (ref 11.5–15.5)
WBC: 18.1 10*3/uL — AB (ref 4.0–10.5)

## 2017-03-10 LAB — PROCALCITONIN: Procalcitonin: 1.16 ng/mL

## 2017-03-10 LAB — MAGNESIUM: MAGNESIUM: 2.2 mg/dL (ref 1.7–2.4)

## 2017-03-10 MED ORDER — POTASSIUM CHLORIDE 10 MEQ/50ML IV SOLN: 10.0000 meq | INTRAVENOUS | Status: DC

## 2017-03-10 MED ORDER — METOPROLOL TARTRATE 5 MG/5ML IV SOLN
2.5000 mg | Freq: Four times a day (QID) | INTRAVENOUS | Status: DC
  Administered 2017-03-10: 5 mg via INTRAVENOUS
  Administered 2017-03-10: 2.5 mg via INTRAVENOUS
  Administered 2017-03-10 – 2017-03-11 (×2): 5 mg via INTRAVENOUS
  Filled 2017-03-10 (×7): qty 5

## 2017-03-10 MED ORDER — METOPROLOL TARTRATE 5 MG/5ML IV SOLN: 2.5000 mg | INTRAVENOUS | Status: DC | PRN

## 2017-03-10 MED ORDER — PREDNISONE 20 MG PO TABS
20.0000 mg | ORAL_TABLET | Freq: Every day | ORAL | Status: DC
  Administered 2017-03-11: 20 mg
  Filled 2017-03-10: qty 1

## 2017-03-10 MED ORDER — POTASSIUM CHLORIDE 10 MEQ/100ML IV SOLN
10.0000 meq | INTRAVENOUS | Status: AC
  Administered 2017-03-10 (×4): 10 meq via INTRAVENOUS
  Filled 2017-03-10 (×4): qty 100

## 2017-03-10 NOTE — Progress Notes (Signed)
eLink Physician-Brief Progress Note Patient Name: Bela Sokoloff DOB: October 20, 1966 MRN: [id number]   Date of Service  03/10/2017  HPI/Events of Note  CVP = 25.  eICU Interventions  Will order: 1. Metoprolol 2.5 - 5 mg IV Q 6 hours. Hold for SBP < 110 or HR < 60.     Intervention Category Major Interventions: Arrhythmia - evaluation and management  [name],Steven Eugene 03/10/2017, 6:12 AM

## 2017-03-10 NOTE — Progress Notes (Signed)
RN walked into room because pt's ventilator was going off. RN observed pt's HR in the 140-150's, pt breathing 45-50 times a minutes. RN noticed blood at the pt's neck and observed the left IJ central line had been removed with the entire catheter laying next to the patient and the sutures no longer attached to the patient. RN switched the fentanyl and propofol to enter the left hand IV and was able to calm patient. RN informed the Engineer, manufacturing systems and CCM of the situation. Pt's mother was the last person in the room, but denies knowing anything about the line removal. Pt was properly in bilateral soft wrist restraints with safety mitts on. RN will continue to monitor and observe.

## 2017-03-10 NOTE — Progress Notes (Signed)
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 Intubated for Acute respiratory failure 2nd to influenza A with pneumonia.  Lines / Drains: ETT 4/29>> OGT 4/29>> PIV x2 4/29>> Left IJ 4/30>> 5/8  Cultures: Bcx 4/29>> NGTD Sputum 4/29>>>NF RVP 4/29 > FLU A UA negative Blood 5/6 > Trache 5/7 >  Antibiotics: Ceftriaxone 4/29>> 5/4 Azithromycin 4/29>>>5/1 4/30 tamifli ( empiric)>>>5/4 4/30 vanc>>>5/2; 5/6 >  Zosyn 5/6 >  Tests / Events: 4/29: Arrived to ED via EMS with SOB requiring intubation.    SUBJECTIVE:   Agitation issues overnight, not improved on precedex- changed back to propofol and better w/ fentanyl  One episode of bradycardia on propofol  Failed SBT- due to agitation Smear BM  Per RN, restrained patient found this morning with Left sutured IJ laying on his chest.  Mother who has been at the beside did not know what happened.     Vital Signs: BP (!) 144/68   Pulse (!) 106   Temp 99.7 F (37.6 C) (Core (Comment))   Resp (!) 23   Ht 6' (1.829 m)   Wt (!) 359 lb (162.8 kg)   SpO2 95%   BMI 48.69 kg/m    Physical Examination: General:  Morbidly obese adult male lying in bed, NAD HEENT: MM pink/moist, ETT, dressing to left neck intact/dry, thick neck Neuro: sedated, intermittently agitated/ follows commands, MAE CV: ST 103, s1s2 rrr, no murmur PULM: even/non-labored on MV, lungs bilaterally coarse, diminished in bases GI: obese, soft, distant BS Extremities: warm/dry, +1 edema  Skin: no rashes or lesions   CBC Recent Labs     03/08/17  2242  03/09/17  0431  03/10/17  0600  WBC  14.6*  14.3*  18.1*  HGB  14.8  14.2  14.7  HCT  47.2  45.9  46.5  PLT  179  180  215     Coag's No results for input(s): APTT, INR in the last 72 hours.  BMET Recent Labs     03/08/17  2242  03/09/17  0431  03/10/17  0600  NA  145  147*  147*  K  3.7  3.4*  3.3*  CL  104  108  106  CO2  32  31  28  BUN  37*  33*  35*  CREATININE  1.39*  1.30*  1.49*  GLUCOSE  281*  204*  191*    Electrolytes Recent Labs     03/08/17  0339  03/08/17  2242  03/09/17  0431  03/10/17  0600  CALCIUM  8.3*  8.2*  8.2*  8.5*  MG  2.1   --   2.2  2.2  PHOS  2.9   --   2.8   --     Sepsis Markers Recent Labs     03/09/17  1600  03/10/17  0421  PROCALCITON  1.00  1.16    ABG No results for input(s): PHART, PCO2ART, PO2ART in the last 72 hours.  Liver Enzymes No results for input(s): AST, ALT, ALKPHOS, BILITOT, ALBUMIN in the last 72 hours.  Cardiac Enzymes No results for input(s): TROPONINI, PROBNP in the last 72 hours.  Glucose Recent Labs  03/09/17  1542  03/09/17  2140  03/09/17  2318  03/10/17  0450  03/10/17  0831  03/10/17  1217  GLUCAP  247*  217*  195*  164*  188*  158*    Imaging Dg Chest Port 1 View  Result Date: 03/09/2017 CLINICAL DATA:  Endotracheal tube, acute respiratory failure. EXAM: PORTABLE CHEST 1 VIEW COMPARISON:  03/08/2017. FINDINGS: Endotracheal tube terminates approximately 5 cm above the carina. Nasogastric tube is followed into the stomach with the tip projecting beyond the inferior margin of the image. Left IJ central line tip projects over the SVC. Heart is enlarged. Lungs are low in volume with diffuse mixed interstitial and airspace opacification, increased. No definite pleural fluid. IMPRESSION: Increasing mixed interstitial and airspace opacification, indicative of edema. Electronically Signed   By: Leanna Battles M.D.   On: 03/09/2017 07:54   Dg Abd Portable 1v  Result Date: 03/09/2017 CLINICAL DATA:  Feeding tube advancement EXAM: PORTABLE ABDOMEN - 1 VIEW COMPARISON:  03/09/2017 FINDINGS: NG tube in within the proximal  stomach. Limited portable exam including only a portion of the abdomen and chest. Heart is enlarged. These gas distention of the bowel in the left abdomen, suspect ileus. Degenerative changes noted spine. IMPRESSION: NG tube within the proximal stomach, slightly more advanced than the prior study. Suspect diffuse ileus Cardiomegaly Electronically Signed   By: Judie Petit.  Shick M.D.   On: 03/09/2017 15:20   Dg Abd Portable 1v  Result Date: 03/09/2017 CLINICAL DATA:  NG tube placement EXAM: PORTABLE ABDOMEN - 1 VIEW COMPARISON:  03/08/2017 FINDINGS: NG tube tip projects over the proximal stomach with the side-port in the distal esophagus. Continued diffuse gaseous distention of bowel, likely ileus. IMPRESSION: NG tube tip in the proximal stomach with the side port in the distal esophagus. Continued ileus pattern. Electronically Signed   By: Charlett Nose M.D.   On: 03/09/2017 14:07   Dg Abd Portable 1v  Result Date: 03/08/2017 CLINICAL DATA:  Acute onset of generalized abdominal distention. Initial encounter. EXAM: PORTABLE ABDOMEN - 1 VIEW COMPARISON:  None. FINDINGS: There is diffuse distention of small and large bowel loops, concerning for ileus. The enteric tube is noted overlying the body of the stomach. No free intra-abdominal air is seen, though evaluation for free air is limited on supine images. No acute osseous abnormalities are identified. Mild degenerative change is noted along the lower thoracic spine. IMPRESSION: Diffuse distention of small and large bowel loops likely reflects ileus. Enteric tube noted overlying the body of the stomach. No free intra-abdominal air seen. Electronically Signed   By: Roanna Raider M.D.   On: 03/08/2017 23:46     DISCUSSION: 51 y/o M w/combined CHF, morbid obesity/OSA, HTN and DM2 here with influenza & acute respiratory failure requiring intubation 4/29. Noncompliant with medications for several months + several recent sick contacts.  Poor progress with wean, Diuresing  well. New fever and possible VAP on 5/7.    PULMONARY Acute Hypoxemic Hypercapneic Respiratory Failure 2/2 CAP (initially) + Influenza + Acute Pulm edema + VAP (5/7) + OSA (noncompliant) Tobacco abuse PLAN:   Continue full Vent support PRVC 8cc/kg Assess daily SBT  Continue pulmicort and duonebs  Continue steroid taper  Continue lasix as below Robinul for secretions Follow cultures Continue abx as below CXR in am    CARDIOVASCULAR Acute  diastolic CHF (LVEF 50-55%, g1dd 03/03/2017). NICM  ([facility], 03/2016) Acute Pulmonary edema Demand ischemia Paroxysmal Afib - not on anticoag 2/2 non compliance HTN, HLD  PLAN:  ICU monitoring Trend daily weights Continue diuresis  Place PICC   RENAL AKI - from overdiuresis. Creatinine stable PLAN:   Continue lasix as above Trend BMP / urinary output/ daily weights Replace electrolytes as indicated Avoid nephrotoxic agents, ensure adequate renal perfusion   GASTROINTESTINAL  Ileus Constipation - smear BM 5/7 PLAN:   OGT Pepcid for SUP Holding TF Bowel regmen - SMOG, Erythromycin per feeding tube.  Try to cut down fentanyl.  Famotidine BID   HEMATOLOGIC DVT ppx PLAN:  Heparin for DVT ppx   INFECTIOUS CAP + Influenza A Suspect RLL VAP PLAN:   On Zosyn and Vanc Panculture trend PCT   ENDOCRINE Type 2 diabetes not on any DM meds at home. A1c 8.2 PLAN:   SSI On Lantus 10 U   NEUROLOGIC Acute encephalopathy - due to sedation PLAN:   Continue fentanyl/ propofol  Versed prn  Try to minimize 2/2 ileus.  RAAS goal 0/ -1   Family updates:  Patients mother at the bedside.  Updated by Dr. Christene Slates.    CC time: [address], AGACNP-BC [facility] Pgr: [phone] or if no answer [phone] 03/10/2017, 3:45 PM   ATTENDING NOTE / ATTESTATION NOTE :   I have discussed the case with the resident/APP  Posey Boyer NP   I agree with the resident/APP's  history, physical examination,  assessment, and plans.    I have edited the above note and modified it according to our agreed history, physical examination, assessment and plan.   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 Intubated for Acute respiratory failure 2nd to influenza A with pneumonia. Course complicated by difficulty weaning secondary to agitation. Recent fevers, right lower lobe infiltrate, concern for VAP on 5/7.  Pt has not had a BM since admission. Diurescing.    Vitals:  Vitals:   03/10/17 1242 03/10/17 1300 03/10/17 1400 03/10/17 1500  BP: 117/74 (!) 158/103 (!) 160/87 (!) 144/68  Pulse: (!) 110 (!) 117 (!) 114 (!) 106  Resp: 19 (!) 22 (!) 28 (!) 23  Temp:      TempSrc:      SpO2: 94% 93% 96% 95%  Weight:      Height:        Constitutional/General: well-nourished, well-developed, intubated, sedated, not in any distress  Body mass index is 48.69 kg/m. Wt Readings from Last 3 Encounters:  03/10/17 (!) 162.8 kg (359 lb)    HEENT: PERLA, anicteric sclerae. (-) Oral thrush. Intubated, ETT in place  Neck: No masses. Midline trachea. No JVD, (-) LAD. (-) bruits appreciated.  Respiratory/Chest: Grossly normal chest. (-) deformity. (-) Accessory muscle use.  Symmetric expansion. Diminished BS on both lower lung zones. (-) wheezing, rhonchi Crackles bibasilar (-) egophony  Cardiovascular: Regular rate and  rhythm, heart sounds normal, no murmur or gallops,  Gr 2 peripheral edema  Gastrointestinal:  Normal bowel sounds. Soft, non-tender. No hepatosplenomegaly.  (-) masses.   Musculoskeletal:  Normal muscle tone.   Extremities: Grossly normal. (-) clubbing, cyanosis.  Gr 2 edema  Skin: (-) rash,lesions seen.   Neurological/Psychiatric : sedated, intubated. CN grossly intact. (-) lateralizing signs.      CBC Recent Labs     03/08/17  2242  03/09/17  0431  03/10/17  0600  WBC  14.6*  14.3*  18.1*  HGB  14.8  14.2  14.7  HCT  47.2  45.9  46.5  PLT  179  180  215    Coag's No results for input(s): APTT, INR in the last 72 hours.  BMET Recent Labs     03/08/17  2242  03/09/17  0431  03/10/17  0600  NA  145  147*  147*  K  3.7  3.4*  3.3*  CL  104  108  106  CO2  32  31  28  BUN  37*  33*  35*  CREATININE  1.39*  1.30*  1.49*  GLUCOSE  281*  204*  191*    Electrolytes Recent Labs     03/08/17  0339  03/08/17  2242  03/09/17  0431  03/10/17  0600  CALCIUM  8.3*  8.2*  8.2*  8.5*  MG  2.1   --   2.2  2.2  PHOS  2.9   --   2.8   --     Sepsis Markers Recent Labs     03/09/17  1600  03/10/17  0421  PROCALCITON  1.00  1.16    ABG No results for input(s): PHART, PCO2ART, PO2ART in the last 72 hours.  Liver Enzymes No results for input(s): AST, ALT, ALKPHOS, BILITOT, ALBUMIN in the last 72 hours.  Cardiac Enzymes No results for input(s): TROPONINI, PROBNP in the last 72 hours.  Glucose Recent Labs     03/09/17  1542  03/09/17  2140  03/09/17  2318  03/10/17  0450  03/10/17  0831  03/10/17  1217  GLUCAP  247*  217*  195*  164*  188*  158*    Imaging Dg Chest Port 1 View  Result Date: 03/09/2017 CLINICAL DATA:  Endotracheal tube, acute respiratory failure. EXAM: PORTABLE CHEST 1 VIEW COMPARISON:  03/08/2017. FINDINGS: Endotracheal tube terminates approximately 5 cm above the carina. Nasogastric tube is followed into the stomach with the tip projecting beyond the inferior margin of the image. Left IJ central line tip projects over the SVC. Heart is enlarged. Lungs are low in volume with diffuse mixed interstitial and airspace opacification, increased. No definite pleural fluid. IMPRESSION: Increasing mixed interstitial and airspace opacification, indicative of edema. Electronically Signed   By: Leanna Battles M.D.   On: 03/09/2017 07:54   Dg Abd  Portable 1v  Result Date: 03/09/2017 CLINICAL DATA:  Feeding tube advancement EXAM: PORTABLE ABDOMEN - 1 VIEW COMPARISON:  03/09/2017 FINDINGS: NG tube in within the proximal stomach. Limited portable exam including only a portion of the abdomen and chest. Heart is enlarged. These gas distention of the bowel in the left abdomen, suspect ileus. Degenerative changes noted spine. IMPRESSION: NG tube within the proximal stomach, slightly more advanced than the prior study. Suspect diffuse ileus Cardiomegaly Electronically Signed   By: Judie Petit.  Shick M.D.   On: 03/09/2017 15:20   Dg Abd Portable 1v  Result Date: 03/09/2017 CLINICAL DATA:  NG tube placement EXAM: PORTABLE ABDOMEN - 1 VIEW COMPARISON:  03/08/2017 FINDINGS: NG tube tip projects over the proximal stomach with the side-port in the distal esophagus. Continued diffuse gaseous distention of bowel, likely ileus. IMPRESSION: NG tube tip in the proximal stomach with the side port in the distal esophagus. Continued ileus pattern. Electronically Signed   By: Charlett Nose M.D.   On: 03/09/2017 14:07   Dg Abd Portable 1v  Result Date: 03/08/2017 CLINICAL DATA:  Acute onset of generalized abdominal distention. Initial encounter. EXAM: PORTABLE ABDOMEN - 1 VIEW COMPARISON:  None. FINDINGS: There is diffuse distention of small and large bowel loops, concerning for ileus. The enteric tube is noted overlying the body of the stomach. No free intra-abdominal air is seen, though evaluation for free air is limited on supine images. No acute osseous abnormalities are identified. Mild degenerative change is noted along the lower thoracic spine. IMPRESSION: Diffuse distention of small and large bowel loops likely reflects ileus. Enteric tube noted overlying the body of the stomach. No free intra-abdominal air seen. Electronically Signed   By: Roanna Raider M.D.   On: 03/08/2017 23:46    Assessment/Plan : Acute Hypoxemic Hypercapneic Respiratory Failure 2/2 CAP (initially) +  Influenza + Acute Pulm edema + VAP (5/7) + OSA (noncompliant) Tobacco abuse - Continue ventilatory support. Continue pressure support trial daily as tolerated. - Continue diuresis - Conc Vanc and Zosyn for possible VAP (5/7) - decrease prednisone to 20 mg starting in am - a significant barrier to this patient care is sedation (will discuss below)   Acute  diastolic CHF (LVEF 50-55%, g1dd 03/03/2017). NICM  ([facility], 03/2016) Acute Pulmonary edema Demand ischemia Paroxysmal Afib - not on anticoag 2/2 non compliance HTN, HLD  - cont diuresis  AKI - cont gentle diuresis - observe creatinine  Constipation + Ileus - cont meds as ordered - may need smog - bowel rest - try to decrease fentanyl drip  Threat to patient care - I was told the patient's mother tried to extubate the patient over the weekend. - This morning, the nurse went inside patient's room and saw that his left triple-lumen catheter was pulled out and his neck was bleeding. The only nonmedical person who has been in the room was patient's mother.  The patient was sedated and restrained and it was impossible for patient to have pulled out the L IJ TLC, with the sutures also out.  - Patient's mother was very hostile to me when I discussed his case with her. She wanted him extubated. I told her, we are doing daily exercise to determine whether we can extubate him. He's failing the last several days weaning trial because of underlying pneumonia and pulmonary edema. I also mentioned that we are treating for possible new lung infection with new antibiotics. She did not want him on any sedation. I explained that he needs to be on sedation because if he were not on sedation, patient will be very uncomfortable and will go into respiratory distress. I told her, we try to turn off sedation every day and assess. I am not sure if she understands what we are doing for her son. I feel she is a threat to his care. The whole ICU staff is aware of the  situation. We will consult case manager. Patient will also need a sitter (tele) to prevent untoward events happening to the patient.     I spent  30  minutes of Critical Care time with this patient today. This is my time spent independent of the APP or resident.     Pollie Meyer, MD 03/10/2017, 4:47 PM [facility] Pager [phone] After 3 pm or if no answer, call [phone]

## 2017-03-10 NOTE — Progress Notes (Signed)
eLink Physician-Brief Progress Note Patient Name: Luis [name] DOB: 10-07-1966 MRN: [id number]   Date of Service  03/10/2017  HPI/Events of Note  Elevated HR - EKG reveals Sinus Tachycardia with rate = 120 with PVC's.  eICU Interventions  Will order: 1. BMP and Magnesium level now. 2. Check CVP. If CVP < 10, will give fluid. If CVP >= 10. Will try B-Blocker.     Intervention Category Major Interventions: Arrhythmia - evaluation and management  Zlata Alcaide Eugene 03/10/2017, 5:45 AM

## 2017-03-10 NOTE — Progress Notes (Signed)
Attempted to get consent for PICC placement via Mother without success. Patient's Nurse aware.

## 2017-03-10 NOTE — Progress Notes (Signed)
eLink Physician-Brief Progress Note Patient Name: Luis [name] DOB: Nov 01, 1966 MRN: [id number]   Date of Service  03/10/2017  HPI/Events of Note  Patient reported to have stuck nurse.   eICU Interventions  Will order: 1. Bilateral soft wrist restraints.      Intervention Category Minor Interventions: Agitation / anxiety - evaluation and management  Lekia Nier Eugene 03/10/2017, 6:19 AM

## 2017-03-11 ENCOUNTER — Inpatient Hospital Stay (HOSPITAL_COMMUNITY): Payer: BLUE CROSS/BLUE SHIELD

## 2017-03-11 ENCOUNTER — Encounter (HOSPITAL_COMMUNITY): Payer: Self-pay | Admitting: *Deleted

## 2017-03-11 DIAGNOSIS — J9602 Acute respiratory failure with hypercapnia: Secondary | ICD-10-CM

## 2017-03-11 DIAGNOSIS — N179 Acute kidney failure, unspecified: Secondary | ICD-10-CM

## 2017-03-11 DIAGNOSIS — J90 Pleural effusion, not elsewhere classified: Secondary | ICD-10-CM

## 2017-03-11 DIAGNOSIS — J101 Influenza due to other identified influenza virus with other respiratory manifestations: Secondary | ICD-10-CM

## 2017-03-11 LAB — GLUCOSE, CAPILLARY
GLUCOSE-CAPILLARY: 129 mg/dL — AB (ref 65–99)
Glucose-Capillary: 129 mg/dL — ABNORMAL HIGH (ref 65–99)
Glucose-Capillary: 134 mg/dL — ABNORMAL HIGH (ref 65–99)
Glucose-Capillary: 141 mg/dL — ABNORMAL HIGH (ref 65–99)
Glucose-Capillary: 147 mg/dL — ABNORMAL HIGH (ref 65–99)
Glucose-Capillary: 156 mg/dL — ABNORMAL HIGH (ref 65–99)
Glucose-Capillary: 175 mg/dL — ABNORMAL HIGH (ref 65–99)
Glucose-Capillary: 197 mg/dL — ABNORMAL HIGH (ref 65–99)

## 2017-03-11 LAB — BASIC METABOLIC PANEL
ANION GAP: 14 (ref 5–15)
BUN: 38 mg/dL — ABNORMAL HIGH (ref 6–20)
CALCIUM: 8.7 mg/dL — AB (ref 8.9–10.3)
CO2: 27 mmol/L (ref 22–32)
Chloride: 108 mmol/L (ref 101–111)
Creatinine, Ser: 1.93 mg/dL — ABNORMAL HIGH (ref 0.61–1.24)
GFR, EST AFRICAN AMERICAN: 45 mL/min — AB (ref >60)
GFR, EST NON AFRICAN AMERICAN: 39 mL/min — AB (ref >60)
GLUCOSE: 136 mg/dL — AB (ref 65–99)
Potassium: 4 mmol/L (ref 3.5–5.1)
Sodium: 149 mmol/L — ABNORMAL HIGH (ref 135–145)

## 2017-03-11 LAB — PROCALCITONIN: Procalcitonin: 1.28 ng/mL

## 2017-03-11 MED ORDER — ACETAMINOPHEN 650 MG RE SUPP: 650.0000 mg | RECTAL | Status: DC | PRN

## 2017-03-11 MED ORDER — IPRATROPIUM-ALBUTEROL 0.5-2.5 (3) MG/3ML IN SOLN
3.0000 mL | Freq: Four times a day (QID) | RESPIRATORY_TRACT | Status: DC
  Administered 2017-03-11 – 2017-03-16 (×18): 3 mL via RESPIRATORY_TRACT
  Filled 2017-03-11 (×21): qty 3

## 2017-03-11 MED ORDER — METOPROLOL TARTRATE 5 MG/5ML IV SOLN
5.0000 mg | Freq: Four times a day (QID) | INTRAVENOUS | Status: DC
  Administered 2017-03-12 – 2017-03-16 (×15): 5 mg via INTRAVENOUS
  Filled 2017-03-11 (×19): qty 5

## 2017-03-11 MED ORDER — DEXTROSE-NACL 5-0.45 % IV SOLN
INTRAVENOUS | Status: DC
  Administered 2017-03-11 – 2017-03-14 (×3): via INTRAVENOUS

## 2017-03-11 MED ORDER — MIDAZOLAM HCL 2 MG/2ML IJ SOLN: 1.0000 mg | INTRAMUSCULAR | Status: DC | PRN

## 2017-03-11 MED ORDER — PANTOPRAZOLE SODIUM 40 MG PO PACK
40.0000 mg | PACK | Freq: Every day | ORAL | Status: DC
  Administered 2017-03-11: 40 mg
  Filled 2017-03-11: qty 20

## 2017-03-11 MED ORDER — PANTOPRAZOLE SODIUM 40 MG IV SOLR
40.0000 mg | INTRAVENOUS | Status: DC
  Administered 2017-03-11 – 2017-03-13 (×3): 40 mg via INTRAVENOUS
  Filled 2017-03-11 (×2): qty 40

## 2017-03-11 MED ORDER — SENNOSIDES-DOCUSATE SODIUM 8.6-50 MG PO TABS
2.0000 | ORAL_TABLET | Freq: Two times a day (BID) | ORAL | Status: DC
  Administered 2017-03-11: 2 via ORAL
  Filled 2017-03-11 (×2): qty 2

## 2017-03-11 MED ORDER — FUROSEMIDE 10 MG/ML IJ SOLN
40.0000 mg | Freq: Two times a day (BID) | INTRAMUSCULAR | Status: DC
  Administered 2017-03-11: 40 mg via INTRAVENOUS
  Filled 2017-03-11: qty 4

## 2017-03-11 MED ORDER — METHYLPREDNISOLONE SODIUM SUCC 40 MG IJ SOLR
20.0000 mg | Freq: Every day | INTRAMUSCULAR | Status: DC
  Administered 2017-03-11 – 2017-03-14 (×4): 20 mg via INTRAVENOUS
  Filled 2017-03-11 (×3): qty 0.5
  Filled 2017-03-11: qty 1
  Filled 2017-03-11: qty 0.5

## 2017-03-11 NOTE — Progress Notes (Signed)
Spoke with primary RN regarding PICC insertion.  Unable to obtain consent from the mother.  We will follow up.

## 2017-03-11 NOTE — Progress Notes (Signed)
Spoke with mother regarding PICC. Mother states "I'm not giving consent for that, maybe his sister will." Sister to be in town 5/10. Nursing to continue to monitor current IV's with medication administration.

## 2017-03-11 NOTE — Progress Notes (Signed)
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 Intubated for acute respiratory failure 2nd to influenza A with pneumonia.   SUBJECTIVE:  RN reports pt hypertensive and tachycardic upon waking from sedation.  No acute events overnight.  Lateral transfer from cardiac ICU to medical.      Vital Signs: BP (!) 161/84 (BP Location: Right Arm)   Pulse 87   Temp 97.9 F (36.6 C) (Core (Comment))   Resp 16   Ht 6' (1.829 m)   Wt (!) 367 lb (166.5 kg)   SpO2 95%   BMI 49.77 kg/m    Physical Examination: General:  Morbidly obese male in NAD on vent  HEENT: MM pink/moist, ETT, poor dentition  PSY: calm Neuro: Awake, alert, nods appropriately, follows commands CV: s1s2 irr irr, PAC's noted, no m/r/g PULM: even/non-labored, lungs bilaterally coarse, diminished lower bilaterally  WU:JWJX, non-tender, bsx4 active  Extremities: warm/dry, no edema  Skin: no rashes or lesions   CBC Recent Labs     03/08/17  2242  03/09/17  0431  03/10/17  0600  WBC  14.6*  14.3*  18.1*  HGB  14.8  14.2  14.7  HCT  47.2  45.9  46.5  PLT  179  180  215    Coag's No results for input(s): APTT, INR in the last 72 hours.  BMET Recent Labs     03/08/17  2242  03/09/17  0431  03/10/17  0600  NA  145  147*  147*  K  3.7  3.4*  3.3*  CL  104  108  106  CO2  32  31  28  BUN  37*  33*  35*  CREATININE  1.39*  1.30*  1.49*  GLUCOSE  281*  204*  191*    Electrolytes Recent Labs     03/08/17  2242  03/09/17  0431  03/10/17  0600  CALCIUM  8.2*  8.2*  8.5*  MG   --   2.2  2.2  PHOS   --   2.8   --     Sepsis Markers Recent Labs     03/09/17  1600  03/10/17  0421  PROCALCITON  1.00  1.16     ABG No results for input(s): PHART, PCO2ART, PO2ART in the last 72 hours.  Liver Enzymes No results for input(s): AST, ALT, ALKPHOS, BILITOT, ALBUMIN in the last 72 hours.  Cardiac Enzymes No results for input(s): TROPONINI, PROBNP in the last 72 hours.  Glucose Recent Labs     03/10/17  1708  03/10/17  1956  03/11/17  0000  03/11/17  0231  03/11/17  0414  03/11/17  0757  GLUCAP  176*  158*  141*  129*  134*  129*    Imaging Dg Chest Port 1 View  Result Date: 03/11/2017 CLINICAL DATA:  51 year old male with shortness of breath and intubated. Acute on chronic systolic congestive heart failure, pulmonary edema, obesity. EXAM: PORTABLE CHEST 1 VIEW COMPARISON:  03/09/2017 and earlier. FINDINGS: Portable AP semi upright view at 0455 hours. Left IJ central line removed. Stable endotracheal tube. Stable visible enteric tube. The patient is slightly rotated to the  right. Patchy and confluent right lung base opacity has progressed since 03/07/2017. At the same time, since yesterday pulmonary vascularity appears more distinct. Generalized interstitial opacity appears decreased. Left lung base ventilation has improved since yesterday. No areas of worsening ventilation. No pneumothorax or definite pleural effusion. IMPRESSION: 1. Left IJ central line removed.  Otherwise stable lines and tubes. 2. Improved ventilation since yesterday probably due to regressed pulmonary edema. 3. Increased right lung base opacity since 03/07/2017 which could reflect atelectasis or infection. Electronically Signed   By: Odessa Fleming M.D.   On: 03/11/2017 07:17   Dg Abd Portable 1v  Result Date: 03/10/2017 CLINICAL DATA:  Feeding tube placement. EXAM: PORTABLE ABDOMEN - 1 VIEW COMPARISON:  Yesterday. FINDINGS: Nasogastric tube extending into the stomach with its tip in the region of the gastric pylorus or proximal duodenum and side hole in the distal stomach. Dilated right colon without significant change since  03/08/2017. The maximum cecal diameter is 12.6 cm. IMPRESSION: 1. Nasogastric tube tip in the gastric pylorus or proximal duodenum and side hole in the distal stomach. 2. Stable right colon ileus with a maximum cecal diameter of 12.6 cm. Electronically Signed   By: Beckie Salts M.D.   On: 03/10/2017 17:10   Dg Abd Portable 1v  Result Date: 03/09/2017 CLINICAL DATA:  Feeding tube advancement EXAM: PORTABLE ABDOMEN - 1 VIEW COMPARISON:  03/09/2017 FINDINGS: NG tube in within the proximal stomach. Limited portable exam including only a portion of the abdomen and chest. Heart is enlarged. These gas distention of the bowel in the left abdomen, suspect ileus. Degenerative changes noted spine. IMPRESSION: NG tube within the proximal stomach, slightly more advanced than the prior study. Suspect diffuse ileus Cardiomegaly Electronically Signed   By: Judie Petit.  Shick M.D.   On: 03/09/2017 15:20   Dg Abd Portable 1v  Result Date: 03/09/2017 CLINICAL DATA:  NG tube placement EXAM: PORTABLE ABDOMEN - 1 VIEW COMPARISON:  03/08/2017 FINDINGS: NG tube tip projects over the proximal stomach with the side-port in the distal esophagus. Continued diffuse gaseous distention of bowel, likely ileus. IMPRESSION: NG tube tip in the proximal stomach with the side port in the distal esophagus. Continued ileus pattern. Electronically Signed   By: Charlett Nose M.D.   On: 03/09/2017 14:07    Lines / Drains: ETT 4/29 >> OGT 4/29 >> PIV x2 4/29 >> Left IJ 4/30 >> 5/8  Cultures: Bcx 4/29>> NGTD Sputum 4/29 >> normal flora RVP 4/29 > FLU A UA negative Blood 5/6 >> Tracheal aspirate 5/7 >> abundant WBC >>  Antibiotics: Ceftriaxone 4/29 >> 5/4 Azithromycin 4/29 >> 5/1 4/30 tamifli (empiric) >> 5/4 4/30 vanc >> 5/2; 5/6 >> 5/9 Zosyn 5/6 >  Tests / Events: 4/29  Arrived to ED via EMS with SOB requiring intubation.  5/08  Concerns pt's mother removed the central line    DISCUSSION: 51 y/o M w/combined CHF, morbid obesity/OSA,  HTN and DM2 admitted 4/29 with influenza & acute respiratory failure requiring intubation. Noncompliant with medications for several months + several recent sick contacts.  Poor progress with wean, diuresing well. New fever and possible VAP on 5/7.    PULMONARY Acute Hypoxemic Hypercapneic Respiratory Failure 2/2 CAP (initially) + Influenza + Acute Pulm edema + VAP (5/7) + OSA (noncompliant) Tobacco abuse PLAN:   PRVC 8 cc/kg  Wean PEEP / FiO2 for sats >90% Daily SBT / WUA  Intermittent CXR  Continue pulmicort + Duoneb Robinul for secretions  Follow cultures  Reduce Lasix  to 40 mg IV BID  Prednisone 20 mg PT QD  See ID  CARDIOVASCULAR Acute  diastolic CHF (LVEF 50-55%, g1dd 03/03/2017). NICM  ([facility], 03/2016) Acute Pulmonary edema Demand ischemia Paroxysmal Afib - not on anticoag 2/2 non compliance HTN, HLD  PLAN:  ICU monitoring of hemodynamics  Monitor daily weights, strict I/O's  Diuresis as above  Await PICC line placement, ? If family will be resistant  RENAL AKI - in setting of PNA/influenza, diuresis PLAN:   Repeat BMP now Lasix as above, reduce dose 5/9 Trend BMP / urinary output Replace electrolytes as indicated Avoid nephrotoxic agents, ensure adequate renal perfusion D5NS at 50 ml/hr  GASTROINTESTINAL  Ileus Constipation - smear BM 5/7 PLAN:  OGT / NPO  Pepcid for SUP  Bowel regimen - change to Senokot-S BID, Fleet enema PRN, limit narcotics Follow intermittent KUB  HEMATOLOGIC DVT ppx PLAN:  Heparin for DVT prophylaxis   INFECTIOUS CAP + Influenza A Suspect RLL VAP PLAN:   Follow cultures to maturity  ABX as above Discontinue vancomycin Trend PCT   ENDOCRINE DM II -  not on any DM meds at home. A1c 8.2 PLAN:   Lantus 10 units QHS CBG with SSI    NEUROLOGIC Acute encephalopathy - due to sedation, critical illness PLAN:   Fentanyl + propofol for sedation / pain  Versed PRN  Minimize sedation due to ileus  RASS Goal: -1    Family  updates: No family available on am 5/9 NP rounds.  Will update on arrival.     CC Time: 35 minutes  Canary Brim, NP-C [facility] Pgr: [phone] or if no answer [phone] 03/11/2017, 8:22 AM

## 2017-03-11 NOTE — Progress Notes (Signed)
Family (Aunt x2, Niece) updated at bedside.  Mother had left until late this evening (had to pick up children). Reviewed medical illness with patients family from admission to include initial insult of influenza A, PNA, diastolic CHF, AKI, pulmonary edema, atrial fibrillation, demand ischemia, ileus and acute encephalopathy. Reviewed that the patient was unsuccessful at weaning (lasted approximately one hour).  Informed them he is critical but stable.  Hopeful to support him and give him time to heal from current critical illness.  Family states they are thankful for the update.  That said, I do think they recorded my conversation on the phone while I was in the room.  Will continue to support patient and family needs.    Canary Brim, NP-C [facility] Pgr: [phone] or if no answer [phone] 03/11/2017, 1:42 PM

## 2017-03-11 NOTE — Progress Notes (Signed)
Spoke with [facility] MD (Dr. Arsenio Loader) about needing transfer patient in order to open up beds for new admission. Lateral transfer was approved per MD for patient to be taken to 3M.   Attempted to call family to let them know that patient was transferring to a different unit. No family member would pick up the phone and voicemail boxes were full.   Luis [name] [name], [name]

## 2017-03-12 ENCOUNTER — Inpatient Hospital Stay (HOSPITAL_COMMUNITY): Payer: BLUE CROSS/BLUE SHIELD

## 2017-03-12 DIAGNOSIS — N179 Acute kidney failure, unspecified: Secondary | ICD-10-CM

## 2017-03-12 LAB — GLUCOSE, CAPILLARY
GLUCOSE-CAPILLARY: 167 mg/dL — AB (ref 65–99)
GLUCOSE-CAPILLARY: 151 mg/dL — AB (ref 65–99)
GLUCOSE-CAPILLARY: 123 mg/dL — AB (ref 65–99)
GLUCOSE-CAPILLARY: 101 mg/dL — AB (ref 65–99)
Glucose-Capillary: 190 mg/dL — ABNORMAL HIGH (ref 65–99)
Glucose-Capillary: 158 mg/dL — ABNORMAL HIGH (ref 65–99)

## 2017-03-12 LAB — RENAL FUNCTION PANEL
ANION GAP: 11 (ref 5–15)
Albumin: 2.5 g/dL — ABNORMAL LOW (ref 3.5–5.0)
BUN: 43 mg/dL — ABNORMAL HIGH (ref 6–20)
CHLORIDE: 110 mmol/L (ref 101–111)
CO2: 28 mmol/L (ref 22–32)
Calcium: 8.3 mg/dL — ABNORMAL LOW (ref 8.9–10.3)
Creatinine, Ser: 1.86 mg/dL — ABNORMAL HIGH (ref 0.61–1.24)
GFR calc Af Amer: 47 mL/min — ABNORMAL LOW (ref >60)
GFR calc non Af Amer: 41 mL/min — ABNORMAL LOW (ref >60)
GLUCOSE: 180 mg/dL — AB (ref 65–99)
Phosphorus: 4.1 mg/dL (ref 2.5–4.6)
Potassium: 3.8 mmol/L (ref 3.5–5.1)
SODIUM: 149 mmol/L — AB (ref 135–145)

## 2017-03-12 LAB — CBC WITH DIFFERENTIAL/PLATELET
BASOS ABS: 0 10*3/uL (ref 0.0–0.1)
BASOS PCT: 0 %
Eosinophils Absolute: 0 10*3/uL (ref 0.0–0.7)
Eosinophils Relative: 0 %
HEMATOCRIT: 39.9 % (ref 39.0–52.0)
HEMOGLOBIN: 12.1 g/dL — AB (ref 13.0–17.0)
LYMPHS ABS: 2.2 10*3/uL (ref 0.7–4.0)
LYMPHS PCT: 17 %
MCH: 30 pg (ref 26.0–34.0)
MCHC: 30.3 g/dL (ref 30.0–36.0)
MCV: 98.8 fL (ref 78.0–100.0)
MONO ABS: 1.1 10*3/uL — AB (ref 0.1–1.0)
MONOS PCT: 9 %
NEUTROS ABS: 9.4 10*3/uL — AB (ref 1.7–7.7)
Neutrophils Relative %: 74 %
Platelets: 203 10*3/uL (ref 150–400)
RBC: 4.04 MIL/uL — AB (ref 4.22–5.81)
RDW: 15.2 % (ref 11.5–15.5)
WBC: 12.8 10*3/uL — ABNORMAL HIGH (ref 4.0–10.5)

## 2017-03-12 LAB — MAGNESIUM: Magnesium: 2.3 mg/dL (ref 1.7–2.4)

## 2017-03-12 LAB — CULTURE, RESPIRATORY W GRAM STAIN

## 2017-03-12 LAB — CULTURE, RESPIRATORY
CULTURE: NORMAL
SPECIAL REQUESTS: NORMAL

## 2017-03-12 LAB — TRIGLYCERIDES: Triglycerides: 238 mg/dL — ABNORMAL HIGH (ref <150)

## 2017-03-12 MED ORDER — WHITE PETROLATUM GEL
Status: AC
  Filled 2017-03-12: qty 1

## 2017-03-12 MED ORDER — ORAL CARE MOUTH RINSE
15.0000 mL | Freq: Two times a day (BID) | OROMUCOSAL | Status: DC
  Administered 2017-03-12 – 2017-03-16 (×4): 15 mL via OROMUCOSAL

## 2017-03-12 NOTE — Procedures (Signed)
Extubation Procedure Note  Patient Details:   Name: Luis [name] DOB: 1966-04-15 MRN: [id number]   Airway Documentation: Patient had weaned several hours, alert and following commands.  Extubated to 6 lpm [place].  Pt able to speak his name, say thank you to [place] and strong productive cough of pink tinged clear secretions.  IS instruction given pt able to demonstrate 1750-2000 cc with great effort.  Will continue to monitor patient.    Evaluation  O2 sats: stable throughout Complications: No apparent complications Patient did tolerate procedure well. Bilateral Breath Sounds: Diminished, Rhonchi   Yes  Richmond Campbell 03/12/2017, 12:24 PM

## 2017-03-12 NOTE — Plan of Care (Signed)
  Interdisciplinary Goals of Care Family Meeting   Date carried out:: 03/12/2017  Location of the meeting: Bedside  Member's involved: Physician, Bedside Registered Nurse, Respiratory Therapist and Family Member or next of kin  Durable Power of Attorney or acting medical decision maker: Mother & Sister.    Discussion: We discussed goals of care for Luis [name].  Extensive family discussion with mother, sister, called, respiratory therapist, and bedside nurse. Patient is awake. Patient expresses with physical and seemingly verbal cues that he wishes to be extubated. Explained my concern that the patient may not be able to support his current medical ventilation with extubation. Also explained that extubation could result in a catastrophic event and possibly even his death. Family are all in agreement that they wish to respect his own wishes and extubate. I asked all present whether or not the patient would wish to be reintubated in the event of respiratory deterioration and they all signified that they would not wish him to be reintubated even if it meant his death. As per their wishes and the patient's wishes I am extubating the patient. I explained that we will have noninvasive positive pressure ventilation at bedside and available for support if needed. All signified understanding and willingness to accept the consequences.  Code status: Limited Code or DNR with short term  Disposition: Continue current acute care  Time spent for the meeting: 12 minutes   Lawanda Cousins 03/12/2017, 12:04 PM

## 2017-03-12 NOTE — Progress Notes (Signed)
150cc of fentanyl drip wasted in sink. Witnessed by [employer] RN.

## 2017-03-12 NOTE — Progress Notes (Signed)
[facility] Attending Note  Presenting HPI:  51 y.o. 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 Intubated for acute respiratory failure 2nd to influenza A with pneumonia.  Subjective:  No acute events overnight. Patient more awake this morning. Perform spontaneous breathing trial on pressure support 5/5. Respiratory rate 35 upon my entering the room with family at bedside and patient comfortable. Still no bowel movement.  Review of Systems:  Unable to obtain given intubation.   Vent Mode: PSV;CPAP FiO2 (%):  [40 %] 40 % Set Rate:  [16 bmp] 16 bmp Vt Set:  [620 mL] 620 mL PEEP:  [5 cmH20] 5 cmH20 Pressure Support:  [5 cmH20-10 cmH20] 5 cmH20 Plateau Pressure:  [17 cmH20-19 cmH20] 18 cmH20  Temp:  [99.3 F (37.4 C)-99.5 F (37.5 C)] 99.3 F (37.4 C) (05/10 0800) Pulse Rate:  [68-113] 104 (05/10 1121) Resp:  [16-26] 19 (05/10 1121) BP: (83-156)/(57-99) 143/93 (05/10 1121) SpO2:  [92 %-99 %] 99 % (05/10 1121) FiO2 (%):  [40 %] 40 % (05/10 1121) Weight:  [357 lb (161.9 kg)] 357 lb (161.9 kg) (05/10 0500)  General:  Multiple family members at bedside. Intubated. Morbidly obese. Integument:  Warm & dry. No rash on exposed skin. HEENT:  Moist mucus memebranes. No scleral icterus. Endotracheal tube in place. Bilateral scleral injection noted. Neurological:  Pupils symmetric. Moving all 4 extremities equally. Following commands. Nods to questions. Pulmonary:  Symmetric chest wall rise on ventilator. Coarse breath sounds bilaterally. Increased minute ventilation on pressure support 5/5. Cardiovascular:  Slightly tachycardic. No appreciable JVD given body habitus.  Abdomen:  Soft. Protuberant. Hypoactive bowel sounds.  LINES/TUBES: L IJ CVL 4/30 - 5/8 (line pulled out without staff in room & mother  present) OETT 4/29 >>> Foley 4/29 >>> OGT >>> PIV  CBC Latest Ref Rng & Units 03/12/2017 03/10/2017 03/09/2017  WBC 4.0 - 10.5 K/uL 12.8(H) 18.1(H) 14.3(H)  Hemoglobin 13.0 - 17.0 g/dL 12.1(L) 14.7 14.2  Hematocrit 39.0 - 52.0 % 39.9 46.5 45.9  Platelets 150 - 400 K/uL 203 215 180    BMP Latest Ref Rng & Units 03/12/2017 03/11/2017 03/10/2017  Glucose 65 - 99 mg/dL 180(H) 136(H) 191(H)  BUN 6 - 20 mg/dL 43(H) 38(H) 35(H)  Creatinine 0.61 - 1.24 mg/dL 1.86(H) 1.93(H) 1.49(H)  Sodium 135 - 145 mmol/L 149(H) 149(H) 147(H)  Potassium 3.5 - 5.1 mmol/L 3.8 4.0 3.3(L)  Chloride 101 - 111 mmol/L 110 108 106  CO2 22 - 32 mmol/L '28 27 28  '$ Calcium 8.9 - 10.3 mg/dL 8.3(L) 8.7(L) 8.5(L)    Hepatic Function Latest Ref Rng & Units 03/12/2017 03/04/2017 03/01/2017  Total Protein 6.5 - 8.1 g/dL - 6.8 7.9  Albumin 3.5 - 5.0 g/dL 2.5(L) 2.9(L) 3.6  AST 15 - 41 U/L - 53(H) 26  ALT 17 - 63 U/L - 23 15(L)  Alk Phosphatase 38 - 126 U/L - 87 148(H)  Total Bilirubin 0.3 - 1.2 mg/dL - 0.6 0.6   IMAGING/STUDIES: UDS 4/29:  Negative  TTE 5/1:  LV moderately dilated with moderate LVH & EF 50-55%. No regional wall motion and her maladies. Grade 1 diastolic dysfunction. LA mildly dilated. RA poorly visualized but grossly normal in size. RV poorly visualized but cavity size appeared normal. No aortic stenosis or regurgitation. Aortic root normal in size. No mitral stenosis or  regurgitation. No pulmonic stenosis. Poorly visualized tricuspid valve with trivial regurgitation. No pericardial effusion. PORT ABD X-RAY 5/8: IMPRESSION: 1. Nasogastric tube tip in the gastric pylorus or proximal duodenum and side hole in the distal stomach. 2. Stable right colon ileus with a maximum cecal diameter of 12.6 cm. PORT CXR 5/9:  Previously reviewed by me. Interval removal of left internal jugular central venous catheter. Enteric feeding tube coursing below the diaphragm. Endotracheal tube in good position. Comparing imaging from 4/29  and 5/5 there has been improvement in silhouetting of left hemidiaphragm indicating improving opacification/consolidation. The right midlung patchy opacifications seems to be improved slightly with new silhouetting of right hemidiaphragm indicating a right lower lobe developing atelectasis or consolidation. No obvious pleural effusion. Mediastinum normal in contour otherwise. PORT CXR 5/10:  Personally reviewed by me. Enteric feeding tube coursing below diaphragm. Endotracheal tube in good position. Improvement slightly in bilateral lower lung patchy opacities as well as diaphragm silhouetting. No new focal opacity appreciated.  MICROBIOLOGY: MRSA PCR 4/29:  Negative  Blood Cultures x2 4/29:  Negative  HIV 4/29:  Nonreactive  Urine Streptococcal Antigen 4/29:  Negative  Urine Legionella Antigen 4/29:  Negative  Tracheal Aspirate Culture 4/29:  Normal Respiratory Flora  Respiratory Panel PCR 4/29:  Influenza A H1 Blood Cultures x2 5/6 >>> Tracheal Aspirate Culture 5/6 >>>  ANTIBIOTICS: Rocephin 4/29 - 5/4 Azithromycin 4/29 - 5/1 Tamiflu 4/30 - 5/4 Vancomycin 4/30 - 5/2; 5/6 - 5/9 Zosyn 5/6 >>>  SIGNIFICANT EVENTS: 04/29 - Admit w/ intubated in ED for respiratory failure 05/01 - Started on Solu-Medrol 05/03 - Solu-Medrol stopped  05/04 - Transitioned off pressor support (Levophed) & started on Prednisone from Solu-Medrol 05/05 - Precedex started & Fleet Enema given 05/06 - Lantus started for glucose control at 10u daily & Lactulose given via tube. Developed new fever & cultures obtained w/ broadening of antibiotics. 05/07 - Nebulized Pulmicort started & Precedex stopped. Patient given 2 doses of Lactulose via tube. Reglan IV started. 05/08 - Tele Sitter ordered for patient safety due to staff concern that mother removed central venous catheter & lateral transfer from CCU to MICU 05/09 - Tube feeding stopped  ASSESSMENT/PLAN:  51 y.o. male with known history of combined systolic and  diastolic congestive heart failure, morbid obesity with OSA, and diabetes mellitus type 2 oh multiple other medical problems. Presenting with acute hypoxic respiratory failure and septic shock secondary to influenza A pneumonia. Course complicated by colonic ileus.  1. Acute hypoxic and hypercarbic respiratory failure: Extubating patient today as per family and patient's wishes. Plan for noninvasive positive pressure ventilation if needed for respiratory support. Weaning FiO2 for goal saturation 88-94% pulmonary toilet with incentive spirometry.. 2. Influenza A pneumonia: Status post treatment with Tamiflu. 3. Sepsis: Questionable new right lower lobe pneumonia. Repeat blood and tracheal last for cultures pending. Continuing empiric Zosyn day #5/7. 4. Possible right lower lobe healthcare associated pneumonia: Some clearing on repeat chest x-ray. Question atelectasis. Continuing Zosyn as above. Awaiting tracheal aspirate culture. 5. Colonic ileus: Continuing Reglan scheduled.Plan to discontinue fentanyl infusion postextubation.  6. Acute encephalopathy: Multifactorial. Some improvement. Plan to discontinue narcotics, sedatives, and benzodiazepines postextubation.  7. Possible COPD with acute exacerbation: Continuing Duonebs every 6 hours & Pulmicort nebulized twice daily. Continuing IV Solu-Medrol 20 mg daily. 8. Chronic systolic and diastolic congestive heart failure: Continuing to hold on further diuresis given acute renal failure. Continuing Lopressor 5 mg IV every 6 hours with hold parameters.  9. Paroxysmal atrial fibrillation: Continuing  Lopressor 5 mg IV every 6 hours. Holding on systemic and coagulation given history of nonadherence. Continuous telemetry monitoring. 10. Diabetes mellitus type 2: Glucose control. Continuing Lantus 10 units subcutaneous daily & Accu-Cheks every 4 hours with sliding scale insulin coverage.  11. Acute renal failure: Multifactorial. Holding on further diuresis.  Improving. Trending urine output with Foley. Monitoring electrolytes and renal function daily. 12. Hypernatremia: Mild. Continuing D5 half-normal maintenance IV fluid at 50 mL per hour. 13. Hypokalemia: Resolved. 14. Tobacco use disorder: Plan for tobacco cessation education prior to discharge. 15. OSA: Currently endotracheally intubated. 16. Essential hypertension: Continuing Lopressor IV every 6 hours. 17. Hyperlipidemia: Holding on statin therapy with colonic ileus.  Prophylaxis:  SCDs, Heparin [place] q8hr, & Protonix IV daily. Diet:  NPO. Holding tube feedings with ileus.  Code Status:  Code status changed to Do Not Intubate by family prior to extubation today. Disposition:  Remaining in ICU critically ill and intubated. Family Update:  Family were updated by myself during rounds extensively - see plan of care documentation.   I have personally spent a total of 31 minutes of critical care time today caring for the patient & reviewing the patient's electronic medical record.  Sonia Baller Ashok Cordia, M.D. [facility] Pager:  [phone] After 3pm or if no response, call [phone] 11:33 AM 03/12/17

## 2017-03-13 LAB — CULTURE, BLOOD (ROUTINE X 2)
Culture: NO GROWTH
Culture: NO GROWTH
Special Requests: ADEQUATE
Special Requests: ADEQUATE

## 2017-03-13 LAB — GLUCOSE, CAPILLARY
GLUCOSE-CAPILLARY: 139 mg/dL — AB (ref 65–99)
GLUCOSE-CAPILLARY: 110 mg/dL — AB (ref 65–99)
Glucose-Capillary: 142 mg/dL — ABNORMAL HIGH (ref 65–99)
Glucose-Capillary: 131 mg/dL — ABNORMAL HIGH (ref 65–99)
Glucose-Capillary: 146 mg/dL — ABNORMAL HIGH (ref 65–99)
Glucose-Capillary: 140 mg/dL — ABNORMAL HIGH (ref 65–99)

## 2017-03-13 LAB — RENAL FUNCTION PANEL
ALBUMIN: 2.6 g/dL — AB (ref 3.5–5.0)
ANION GAP: 8 (ref 5–15)
BUN: 32 mg/dL — ABNORMAL HIGH (ref 6–20)
CALCIUM: 8.2 mg/dL — AB (ref 8.9–10.3)
CO2: 28 mmol/L (ref 22–32)
Chloride: 113 mmol/L — ABNORMAL HIGH (ref 101–111)
Creatinine, Ser: 1.47 mg/dL — ABNORMAL HIGH (ref 0.61–1.24)
GFR calc non Af Amer: 54 mL/min — ABNORMAL LOW (ref >60)
Glucose, Bld: 134 mg/dL — ABNORMAL HIGH (ref 65–99)
PHOSPHORUS: 2.4 mg/dL — AB (ref 2.5–4.6)
Potassium: 3.2 mmol/L — ABNORMAL LOW (ref 3.5–5.1)
Sodium: 149 mmol/L — ABNORMAL HIGH (ref 135–145)

## 2017-03-13 LAB — CBC WITH DIFFERENTIAL/PLATELET
BASOS ABS: 0 10*3/uL (ref 0.0–0.1)
BASOS PCT: 0 %
Eosinophils Absolute: 0 10*3/uL (ref 0.0–0.7)
Eosinophils Relative: 0 %
HEMATOCRIT: 39.7 % (ref 39.0–52.0)
HEMOGLOBIN: 12.2 g/dL — AB (ref 13.0–17.0)
Lymphocytes Relative: 28 %
Lymphs Abs: 2.6 10*3/uL (ref 0.7–4.0)
MCH: 30.7 pg (ref 26.0–34.0)
MCHC: 30.7 g/dL (ref 30.0–36.0)
MCV: 99.7 fL (ref 78.0–100.0)
MONOS PCT: 11 %
Monocytes Absolute: 1 10*3/uL (ref 0.1–1.0)
NEUTROS ABS: 5.6 10*3/uL (ref 1.7–7.7)
NEUTROS PCT: 61 %
Platelets: 197 10*3/uL (ref 150–400)
RBC: 3.98 MIL/uL — ABNORMAL LOW (ref 4.22–5.81)
RDW: 14.8 % (ref 11.5–15.5)
WBC: 9.3 10*3/uL (ref 4.0–10.5)

## 2017-03-13 LAB — MAGNESIUM: Magnesium: 2.2 mg/dL (ref 1.7–2.4)

## 2017-03-13 MED ORDER — POTASSIUM CHLORIDE 10 MEQ/100ML IV SOLN: 10.0000 meq | INTRAVENOUS | Status: DC

## 2017-03-13 MED ORDER — POTASSIUM CHLORIDE 20 MEQ/15ML (10%) PO SOLN: 30.0000 meq | ORAL | Status: DC

## 2017-03-13 MED ORDER — POTASSIUM CHLORIDE 10 MEQ/100ML IV SOLN
10.0000 meq | INTRAVENOUS | Status: AC
  Administered 2017-03-13 (×4): 10 meq via INTRAVENOUS
  Filled 2017-03-13 (×4): qty 100

## 2017-03-13 NOTE — Progress Notes (Signed)
PT Cancellation Note  Patient Details Name: Luis [name] MRN: [id number] DOB: 03-12-1966   Cancelled Treatment:    Reason Eval/Treat Not Completed: Other (comment) (attempted to evaluate pt however foot of bed broken and will not go down so unable to roll or sit. Maxisky not charged and unable to lift pt OOB to chair for further mobility. Lift charging and RN aware. Will attempt later as time allows)   Mancel Lardizabal B Ruhama Lehew 03/13/2017, 9:37 AM  [name], PT [phone]

## 2017-03-13 NOTE — Progress Notes (Signed)
[facility] ADULT ICU REPLACEMENT PROTOCOL FOR AM LAB REPLACEMENT ONLY  The patient does apply for the [facility] Adult ICU Electrolyte Replacment Protocol based on the criteria listed below:   1. Is GFR >/= 40 ml/min? Yes.    Patient's GFR today is >60 2. Is urine output >/= 0.5 ml/kg/hr for the last 6 hours? Yes.   Patient's UOP is 0.79 ml/kg/hr 3. Is BUN < 60 mg/dL? Yes.    Patient's BUN today is 32 4. Abnormal electrolyte(s): 3.2 5. Ordered repletion with: per protoccol 6. If a panic level lab has been reported, has the CCM MD in charge been notified? Yes.  .   Physician:  Dr. Grant Fontana, [place] Boos 03/13/2017 5:10 AM

## 2017-03-13 NOTE — Progress Notes (Signed)
Patient placed on BIPAP for the evening without complication. RT will continue to monitor throughout the night.

## 2017-03-13 NOTE — Care Management Note (Addendum)
Case Management Note Donn Pierini RN, BSN Unit 2W-Case Manager--2H coverage [phone]  Patient Details  Name: Luis [name] MRN: [id number] Date of Birth: January 28, 1966  Subjective/Objective:    Pt admitted with acute HF-- currently intubated- on vent                Action/Plan: PTA pt lived at home- has family support (mother, uncle, nephew)- CM to follow for d/c needs  Expected Discharge Date:                  Expected Discharge Plan:  Home/Self Care  In-House Referral:     Discharge planning Services  CM Consult  Post Acute Care Choice:    Choice offered to:     DME Arranged:    DME Agency:     HH Arranged:    HH Agency:     Status of Service:  In process, will continue to follow  If discussed at Long Length of Stay Meetings, dates discussed:    Discharge Disposition:   Additional Comments: CIR consulted in addition to CSW for back up plan  Lateral transfer from 2H -  Pt now extubated PT evaluating pt for safe discharge Cherylann Parr, RN 03/13/2017, 11:42 AM

## 2017-03-13 NOTE — Progress Notes (Signed)
[facility] Note  Presenting HPI:  51 y.o. 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 Intubated for acute respiratory failure 2nd to influenza A with pneumonia.  Subjective:   Pt remains extubated, on 3L/[place].  No BM as of yet.    Vent Mode: PSV;CPAP FiO2 (%):  [40 %] 40 % PEEP:  [5 cmH20] 5 cmH20 Pressure Support:  [5 cmH20] 5 cmH20  Temp:  [98.2 F (36.8 C)-101 F (38.3 C)] 98.9 F (37.2 C) (05/11 0719) Pulse Rate:  [72-113] 77 (05/11 0619) Resp:  [10-39] 39 (05/11 0619) BP: (125-180)/(72-115) 160/104 (05/11 0619) SpO2:  [90 %-100 %] 98 % (05/11 0740) FiO2 (%):  [40 %] 40 % (05/11 0149)  General: obese male in NAD, lying in bed HEENT: MM pink/moist, poor dentition  Neuro: Awake, alert, oriented, MAE  CV: s1s2 rrr, no m/r/g PULM: even/non-labored, lungs bilaterally clear anterior, diminished lower YQ:MVHQ, non-tender, bsx4 active  Extremities: warm/dry, trace generalized edema  Skin: no rashes or lesions   LINES/TUBES: L IJ CVL 4/30 - 5/8 (line pulled out without staff in room & mother present) OETT 4/29 >>> 5/10 Foley 4/29 >>> OGT >>> 5/10 PIV  CBC Latest Ref Rng & Units 03/13/2017 03/12/2017 03/10/2017  WBC 4.0 - 10.5 K/uL 9.3 12.8(H) 18.1(H)  Hemoglobin 13.0 - 17.0 g/dL 12.2(L) 12.1(L) 14.7  Hematocrit 39.0 - 52.0 % 39.7 39.9 46.5  Platelets 150 - 400 K/uL 197 203 215    BMP Latest Ref Rng & Units 03/13/2017 03/12/2017 03/11/2017  Glucose 65 - 99 mg/dL 134(H) 180(H) 136(H)  BUN 6 - 20 mg/dL 32(H) 43(H) 38(H)  Creatinine 0.61 - 1.24 mg/dL 1.47(H) 1.86(H) 1.93(H)  Sodium 135 - 145 mmol/L 149(H) 149(H) 149(H)  Potassium 3.5 - 5.1 mmol/L 3.2(L) 3.8 4.0  Chloride 101 - 111 mmol/L 113(H) 110 108  CO2 22 - 32 mmol/L _0 Calcium 8.9 - 10.3 mg/dL 8.2(L) 8.3(L) 8.7(L)    Hepatic  Function Latest Ref Rng & Units 03/13/2017 03/12/2017 03/04/2017  Total Protein 6.5 - 8.1 g/dL - - 6.8  Albumin 3.5 - 5.0 g/dL 2.6(L) 2.5(L) 2.9(L)  AST 15 - 41 U/L - - 53(H)  ALT 17 - 63 U/L - - 23  Alk Phosphatase 38 - 126 U/L - - 87  Total Bilirubin 0.3 - 1.2 mg/dL - - 0.6   IMAGING/STUDIES: UDS 4/29:  Negative  TTE 5/1:  LV moderately dilated with moderate LVH & EF 50-55%. No regional wall motion and her maladies. Grade 1 diastolic dysfunction. LA mildly dilated. RA poorly visualized but grossly normal in size. RV poorly visualized but cavity size appeared normal. No aortic stenosis or regurgitation. Aortic root normal in size. No mitral stenosis or regurgitation. No pulmonic stenosis. Poorly visualized tricuspid valve with trivial regurgitation. No pericardial effusion. PORT ABD X-RAY 5/8: IMPRESSION: 1. Nasogastric tube tip in the gastric pylorus or proximal duodenum and side hole in the distal stomach. 2. Stable right colon ileus with a maximum cecal diameter of 12.6 cm. PORT CXR 5/9:  Interval removal of left internal jugular central venous catheter. Enteric feeding tube coursing below the diaphragm. Endotracheal tube in good position. Comparing imaging from 4/29 and 5/5 there has been improvement in silhouetting of left hemidiaphragm indicating improving opacification/consolidation. The right midlung patchy opacifications seems to be improved slightly with  new silhouetting of right hemidiaphragm indicating a right lower lobe developing atelectasis or consolidation. No obvious pleural effusion. Mediastinum normal in contour otherwise. PORT CXR 5/10: Enteric feeding tube coursing below diaphragm. Endotracheal tube in good position. Improvement slightly in bilateral lower lung patchy opacities as well as diaphragm silhouetting. No new focal opacity appreciated.  MICROBIOLOGY: MRSA PCR 4/29:  Negative  Blood Cultures x2 4/29:  Negative  HIV 4/29:  Nonreactive  Urine Streptococcal Antigen  4/29:  Negative  Urine Legionella Antigen 4/29:  Negative  Tracheal Aspirate Culture 4/29:  Normal Respiratory Flora  Respiratory Panel PCR 4/29:  Influenza A H1 Blood Cultures x2 5/6 >>> Tracheal Aspirate Culture 5/6 >>> normal flora   ANTIBIOTICS: Rocephin 4/29 - 5/4 Azithromycin 4/29 - 5/1 Tamiflu 4/30 - 5/4 Vancomycin 4/30 - 5/2; 5/6 - 5/9 Zosyn 5/6 >>>  SIGNIFICANT EVENTS: 04/29 - Admit w/ intubated in ED for respiratory failure 05/01 - Started on Solu-Medrol 05/03 - Solu-Medrol stopped  05/04 - Transitioned off pressor support (Levophed) & started on Prednisone from Solu-Medrol 05/05 - Precedex started & Fleet Enema given 05/06 - Lantus started for glucose control at 10u daily & Lactulose given via tube. Developed new fever & cultures obtained w/ broadening of antibiotics. 05/07 - Nebulized Pulmicort started & Precedex stopped. Patient given 2 doses of Lactulose via tube. Reglan IV started. 05/08 - Tele Sitter ordered for patient safety due to staff concern that mother removed central venous catheter & lateral transfer from CCU to MICU 05/09 - Tube feeding stopped  ASSESSMENT/PLAN:  51 y.o. male with known history of combined systolic and diastolic congestive heart failure, morbid obesity with OSA, and diabetes mellitus type 2 oh multiple other medical problems. Presenting with acute hypoxic respiratory failure and septic shock secondary to influenza A pneumonia. Course complicated by colonic ileus.   Acute hypoxic and hypercarbic respiratory failure - in setting of influenza A, PNA and CHF.   P: Continue pulmonary hygiene - IS, mobilize  QHS non-invasive pressure for now.  Mom reports sleep study but not sure of results, no machine at home Wean O2 to off for sats > 88-94%   Influenza A pneumonia - s/p Tamiflu treatment  P: Monitor intermittent CXR   Sepsis - resolved P: Continue zosyn, D6/7   Possible right lower lobe healthcare associated pneumonia - likely  atelectasis given clearing on CXR 5/10 P:  Monitor intermittent CXR  Culture negative as above    Colonic ileus  P: Continue reglan  Mobilize  PPI, consider d/c in next 24-48 hours now off vent   Acute encephalopathy - in setting of critical illness, sedation, resolved P: Monitor / supportive care Minimize sedation as able   Possible COPD with acute exacerbation P: Continue Duoneb Q6 Pulmicort BID  Continue solumedrol 20 mg QD, wean to off over 5-7 days   Chronic systolic and diastolic congestive heart failure P: Continue lopressor 47m IV Q6 Transition to oral medication once able to take oral medications   Paroxysmal atrial fibrillation P: Lopressor as above  Hold systemic anticoagulation given hx of nonadherence Monitor telemetry    Diabetes mellitus type 2  P: Lantus 10 units QD CBG with SSI    Acute renal failure P:  Hold diuresis for now  Trend BMP / urinary output Replace electrolytes as indicated Avoid nephrotoxic agents, ensure adequate renal perfusion    Hypernatremia - mild  D51/2NS at 50 ml/hr   Hypokalemia  P: KCL replaced per [facility]5/11    Tobacco use  disorder P: Tobacco cessation counseling    OSA - hx of prior sleep study but does not have mask at home, ? If he was recommended and never filled vs wouldn't do  P: Continue nocturnal BiPAP    Essential hypertension Lopressor as above  PRN hydralazine   Hyperlipidemia  Hold statin given ileus    Prophylaxis:  SCDs, Heparin [place] q8hr, & Protonix IV daily. Diet:  NPO.   Code Status:  DNR/DNI per prior discussion with family.  Disposition:  Transfer to SDU / [place] as of 5/12 am  Family Update:  Family / mother updated on am rounds 5/11.    Noe Gens, NP-C [facility] Pgr: [phone] or if no answer [phone] 03/13/2017, 8:01 AM

## 2017-03-13 NOTE — Evaluation (Signed)
Physical Therapy Evaluation Patient Details Name: Luis [name] MRN: [id number] DOB: Nov 17, 1965 Today's Date: 03/13/2017   History of Present Illness  51 yo admitted with flu A and PNA, heart failure, VDRF 4/30-5/10. PMHx:HTN, obesity, Afib, CHF, NICM, HLD, DM  Clinical Impression  Pt pleasant with flat affect and poor vocal volume. Pt disoriented to day but aware of place. Pt normally independent, living in an apartment alone and working. Mobility limited this session due to foot portion of bed unable to flatten and unable to assess supine to sit which required maxisky. Pt able to attempt standing from chair and requires 2 person assist. Pt with decreased strength, cognition, transfers and mobility who will benefit from acute therapy to maximize mobility, balance, function, strength, gait and independence to decrease burden of care. RN aware of transfer and need for lift.   HR 88, sats 96% on 3L , BP 175/94 in chair    Follow Up Recommendations CIR;Supervision/Assistance - 24 hour    Equipment Recommendations  Rolling walker with 5" wheels;3in1 (PT)    Recommendations for Other Services OT consult     Precautions / Restrictions Precautions Precautions: Fall      Mobility  Bed Mobility Overal bed mobility: Needs Assistance Bed Mobility: Rolling Rolling: Mod assist         General bed mobility comments: mod assist with cues and rail to roll bil for pad placement. Foot of bed broken and unable to be lowered. Pt lifted via Maxi Sky from supine to sitting and tranferred to chair  Transfers Overall transfer level: Needs assistance   Transfers: Sit to/from Stand Sit to Stand: Mod assist;+2 physical assistance         General transfer comment: pt able to shift weight anteriorly and stand with 2 person assist from chair. pt able to clear sacrum into partial squat but could not achieve fully upright  Ambulation/Gait                Stairs            Wheelchair  Mobility    Modified Rankin (Stroke Patients Only)       Balance Overall balance assessment: Needs assistance           Standing balance-Leahy Scale: Poor                               Pertinent Vitals/Pain Pain Assessment: No/denies pain    Home Living Family/patient expects to be discharged to:: Private residence Living Arrangements: Alone Available Help at Discharge: Family;Available PRN/intermittently Type of Home: Apartment Home Access: Stairs to enter   Entrance Stairs-Number of Steps: 6 Home Layout: One level Home Equipment: None      Prior Function Level of Independence: Independent         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     [employer]        Extremity/Trunk Assessment   Upper Extremity Assessment Upper Extremity Assessment: Generalized weakness    Lower Extremity Assessment Lower Extremity Assessment: Generalized weakness       Communication   Communication: Other (comment) (decreased volume of speech)  Cognition Arousal/Alertness: Lethargic Behavior During Therapy: WFL for tasks assessed/performed Overall Cognitive Status: Impaired/Different from baseline Area of Impairment: Orientation;Memory;Safety/judgement                 Orientation Level: Disoriented to;Time   Memory: Decreased short-term memory   Safety/Judgement: Decreased awareness  of deficits            General Comments      Exercises     Assessment/Plan    PT Assessment Patient needs continued PT services  PT Problem List Decreased activity tolerance;Decreased balance;Decreased knowledge of use of DME;Decreased cognition;Decreased mobility;Decreased strength;Decreased safety awareness       PT Treatment Interventions Gait training;Therapeutic exercise;Patient/family education;DME instruction;Therapeutic activities;Cognitive remediation;Stair training;Balance training;Functional mobility training;Neuromuscular re-education     PT Goals (Current goals can be found in the Care Plan section)  Acute Rehab PT Goals Patient Stated Goal: return home and to work PT Goal Formulation: With patient Time For Goal Achievement: 03/27/17 Potential to Achieve Goals: Fair    Frequency Min 3X/week   Barriers to discharge Decreased caregiver support      Co-evaluation               AM-PAC PT "6 Clicks" Daily Activity  Outcome Measure Difficulty turning over in bed (including adjusting bedclothes, sheets and blankets)?: Total Difficulty moving from lying on back to sitting on the side of the bed? : Total Difficulty sitting down on and standing up from a chair with arms (e.g., wheelchair, bedside commode, etc,.)?: Total Help needed moving to and from a bed to chair (including a wheelchair)?: Total Help needed walking in hospital room?: Total Help needed climbing 3-5 steps with a railing? : Total 6 Click Score: 6    End of Session Equipment Utilized During Treatment: Gait belt;Oxygen Activity Tolerance: Patient tolerated treatment well Patient left: in chair;with call bell/phone within reach;with chair alarm set;Other (comment);with nursing/sitter in room (lift pad) Nurse Communication: Mobility status;Need for lift equipment PT Visit Diagnosis: Muscle weakness (generalized) (M62.81);Difficulty in walking, not elsewhere classified (R26.2);Other abnormalities of gait and mobility (R26.89)    Time: 7829-5621 PT Time Calculation (min) (ACUTE ONLY): 24 min   Charges:   PT Evaluation $PT Eval Moderate Complexity: 1 Procedure PT Treatments $Therapeutic Activity: 8-22 mins   PT G Codes:        [name], PT [phone]   [name] 03/13/2017, 2:16 PM

## 2017-03-14 LAB — GLUCOSE, CAPILLARY
GLUCOSE-CAPILLARY: 118 mg/dL — AB (ref 65–99)
GLUCOSE-CAPILLARY: 168 mg/dL — AB (ref 65–99)
GLUCOSE-CAPILLARY: 87 mg/dL (ref 65–99)
Glucose-Capillary: 148 mg/dL — ABNORMAL HIGH (ref 65–99)

## 2017-03-14 LAB — CBC WITH DIFFERENTIAL/PLATELET
BASOS ABS: 0 10*3/uL (ref 0.0–0.1)
BASOS PCT: 0 %
EOS PCT: 1 %
Eosinophils Absolute: 0.1 10*3/uL (ref 0.0–0.7)
HCT: 42.4 % (ref 39.0–52.0)
Hemoglobin: 13 g/dL (ref 13.0–17.0)
Lymphocytes Relative: 27 %
Lymphs Abs: 2.5 10*3/uL (ref 0.7–4.0)
MCH: 30.4 pg (ref 26.0–34.0)
MCHC: 30.7 g/dL (ref 30.0–36.0)
MCV: 99.3 fL (ref 78.0–100.0)
Monocytes Absolute: 0.9 10*3/uL (ref 0.1–1.0)
Monocytes Relative: 9 %
Neutro Abs: 5.7 10*3/uL (ref 1.7–7.7)
Neutrophils Relative %: 63 %
PLATELETS: 198 10*3/uL (ref 150–400)
RBC: 4.27 MIL/uL (ref 4.22–5.81)
RDW: 14.6 % (ref 11.5–15.5)
WBC: 9.1 10*3/uL (ref 4.0–10.5)

## 2017-03-14 LAB — RENAL FUNCTION PANEL
ALBUMIN: 2.6 g/dL — AB (ref 3.5–5.0)
Anion gap: 10 (ref 5–15)
BUN: 25 mg/dL — AB (ref 6–20)
CALCIUM: 8.4 mg/dL — AB (ref 8.9–10.3)
CO2: 26 mmol/L (ref 22–32)
Chloride: 113 mmol/L — ABNORMAL HIGH (ref 101–111)
Creatinine, Ser: 1.28 mg/dL — ABNORMAL HIGH (ref 0.61–1.24)
GFR calc Af Amer: >60 mL/min (ref >60)
GFR calc non Af Amer: >60 mL/min (ref >60)
GLUCOSE: 139 mg/dL — AB (ref 65–99)
PHOSPHORUS: 3.3 mg/dL (ref 2.5–4.6)
POTASSIUM: 3.5 mmol/L (ref 3.5–5.1)
SODIUM: 149 mmol/L — AB (ref 135–145)

## 2017-03-14 LAB — BASIC METABOLIC PANEL
ANION GAP: 11 (ref 5–15)
BUN: 25 mg/dL — ABNORMAL HIGH (ref 6–20)
CALCIUM: 8.4 mg/dL — AB (ref 8.9–10.3)
CO2: 26 mmol/L (ref 22–32)
Chloride: 114 mmol/L — ABNORMAL HIGH (ref 101–111)
Creatinine, Ser: 1.31 mg/dL — ABNORMAL HIGH (ref 0.61–1.24)
Glucose, Bld: 139 mg/dL — ABNORMAL HIGH (ref 65–99)
POTASSIUM: 3.6 mmol/L (ref 3.5–5.1)
Sodium: 151 mmol/L — ABNORMAL HIGH (ref 135–145)

## 2017-03-14 LAB — MAGNESIUM: Magnesium: 2.4 mg/dL (ref 1.7–2.4)

## 2017-03-14 MED ORDER — DEXTROSE-NACL 5-0.9 % IV SOLN
INTRAVENOUS | Status: DC
  Administered 2017-03-14: 22:00:00 via INTRAVENOUS

## 2017-03-14 MED ORDER — INSULIN ASPART 100 UNIT/ML ~~LOC~~ SOLN
0.0000 [IU] | Freq: Every day | SUBCUTANEOUS | Status: DC
  Administered 2017-03-15: 2 [IU] via SUBCUTANEOUS

## 2017-03-14 MED ORDER — INSULIN ASPART 100 UNIT/ML ~~LOC~~ SOLN
0.0000 [IU] | Freq: Three times a day (TID) | SUBCUTANEOUS | Status: DC
  Administered 2017-03-14 (×2): 2 [IU] via SUBCUTANEOUS
  Administered 2017-03-15: 5 [IU] via SUBCUTANEOUS
  Administered 2017-03-15: 3 [IU] via SUBCUTANEOUS
  Administered 2017-03-16: 2 [IU] via SUBCUTANEOUS
  Administered 2017-03-17 (×2): 1 [IU] via SUBCUTANEOUS

## 2017-03-14 MED ORDER — ACETAMINOPHEN 325 MG PO TABS: 650.0000 mg | ORAL_TABLET | Freq: Four times a day (QID) | ORAL | Status: DC | PRN

## 2017-03-14 MED ORDER — PANTOPRAZOLE SODIUM 40 MG PO TBEC
40.0000 mg | DELAYED_RELEASE_TABLET | Freq: Every day | ORAL | Status: DC
  Administered 2017-03-14 – 2017-03-17 (×4): 40 mg via ORAL
  Filled 2017-03-14 (×4): qty 1

## 2017-03-14 NOTE — Progress Notes (Signed)
Patient transported to room 4N14 from 20M. Patient A/O X 4. No acute distress. Sister at bedside.

## 2017-03-14 NOTE — Progress Notes (Signed)
Patients CBG 87, currently on Clear liquids d/t ileus. Called [facility] provider on call, ordered to restart IV fluids D5 NS at 69ml/hr.

## 2017-03-14 NOTE — Evaluation (Signed)
Occupational Therapy Evaluation Patient Details Name: Luis [name] MRN: [id number] DOB: 09/03/1966 Today's Date: 03/14/2017    History of Present Illness 51 yo admitted with flu A and PNA, heart failure, VDRF 4/30-5/10. PMHx:HTN, obesity, Afib, CHF, NICM, HLD, DM   Clinical Impression   Pt was independent prior to admission and enjoyed being with his grandchildren. Pt readily willing to work with OT. Sat EOB x 20 minutes with supervision while engaged in ADL with VSS throughout. Pt presents with generalized weakness and decreased activity tolerance due to prolonged hospitalization. He demonstrates some impaired memory and awareness of safety/deficits. Pt will need intensive rehab prior to return home. Recommending inpatient rehab. Will follow acutely.  Follow Up Recommendations  CIR;Supervision/Assistance - 24 hour    Equipment Recommendations  3 in 1 bedside commode    Recommendations for Other Services       Precautions / Restrictions Precautions Precautions: Fall Restrictions Weight Bearing Restrictions: No      Mobility Bed Mobility Overal bed mobility: Needs Assistance Bed Mobility: Supine to Sit;Sit to Supine     Supine to sit: Min assist Sit to supine: Min assist   General bed mobility comments: cues for technique, assist to raise trunk and for LEs back into bed  Transfers                 General transfer comment: did not attempt this visit    Balance Overall balance assessment: Needs assistance Sitting-balance support: Feet supported Sitting balance-Leahy Scale: Fair                                     ADL either performed or assessed with clinical judgement   ADL Overall ADL's : Needs assistance/impaired Eating/Feeding: Supervision/ safety;Sitting Eating/Feeding Details (indicate cue type and reason): drank ginger ale, cues to slow pace Grooming: Oral care;Sitting;Minimal assistance   Upper Body Bathing: Moderate  assistance;Sitting   Lower Body Bathing: Total assistance;Bed level   Upper Body Dressing : Moderate assistance;Sitting   Lower Body Dressing: Total assistance;Bed level                 General ADL Comments: Pt sat EOB x 20 minutes with supervision.     Vision Patient Visual Report: No change from baseline       Perception     Praxis      Pertinent Vitals/Pain Pain Assessment: No/denies pain     Hand Dominance Right   Extremity/Trunk Assessment Upper Extremity Assessment Upper Extremity Assessment: Generalized weakness (3-/5 shoulder, 3+/5 elbow to hand)   Lower Extremity Assessment Lower Extremity Assessment: Defer to PT evaluation       Communication Communication Communication: Other (comment) (low volume)   Cognition Arousal/Alertness: Awake/alert Behavior During Therapy: WFL for tasks assessed/performed Overall Cognitive Status: Impaired/Different from baseline Area of Impairment: Memory;Safety/judgement                     Memory: Decreased short-term memory   Safety/Judgement: Decreased awareness of deficits;Decreased awareness of safety         General Comments       Exercises     Shoulder Instructions      Home Living Family/patient expects to be discharged to:: Private residence Living Arrangements: Alone Available Help at Discharge: Family;Available PRN/intermittently Type of Home: Apartment Home Access: Stairs to enter Entrance Stairs-Number of Steps: 6   Home Layout: One level  Bathroom Shower/Tub: Doctor, general practice: None          Prior Functioning/Environment Level of Independence: Independent        Comments: works at a [employer] center        OT Problem List: Decreased strength;Decreased activity tolerance;Impaired balance (sitting and/or standing);Decreased cognition;Decreased safety awareness;Decreased knowledge of use of DME or  AE;Cardiopulmonary status limiting activity;Obesity      OT Treatment/Interventions: Self-care/ADL training;DME and/or AE instruction;Patient/family education;Balance training;Cognitive remediation/compensation;Therapeutic activities;Therapeutic exercise    OT Goals(Current goals can be found in the care plan section) Acute Rehab OT Goals Patient Stated Goal: see his grandchildren OT Goal Formulation: With patient Potential to Achieve Goals: Good ADL Goals Pt Will Perform Grooming: with set-up;sitting Pt Will Perform Upper Body Dressing: with set-up;sitting Pt Will Perform Lower Body Dressing: with mod assist;sit to/from stand Pt Will Transfer to Toilet: ambulating;bedside commode;with mod assist Pt Will Perform Toileting - Clothing Manipulation and hygiene: sitting/lateral leans;with mod assist Pt/caregiver will Perform Home Exercise Program: Increased strength;Both right and left upper extremity;With theraband;Independently  OT Frequency: Min 2X/week   Barriers to D/C:            Co-evaluation              AM-PAC PT "6 Clicks" Daily Activity     Outcome Measure Help from another person eating meals?: A Little Help from another person taking care of personal grooming?: A Little Help from another person toileting, which includes using toliet, bedpan, or urinal?: Total Help from another person bathing (including washing, rinsing, drying)?: A Lot Help from another person to put on and taking off regular upper body clothing?: A Lot Help from another person to put on and taking off regular lower body clothing?: Total 6 Click Score: 12   End of Session    Activity Tolerance: Patient tolerated treatment well Patient left: in bed;with call bell/phone within reach;with bed alarm set  OT Visit Diagnosis: Other symptoms and signs involving cognitive function;Muscle weakness (generalized) (M62.81)                Time: 1478-2956 OT Time Calculation (min): 34 min Charges:  OT  General Charges $OT Visit: 1 Procedure OT Evaluation $OT Eval Moderate Complexity: 1 Procedure OT Treatments $Self Care/Home Management : 8-22 mins G-Codes:       Evern Bio 03/14/2017, 2:03 PM  [phone]

## 2017-03-14 NOTE — Progress Notes (Signed)
TEAM 1 - Stepdown/ICU TEAM  Luis [name]  ZOX:[id number] DOB: [birth year] DOA: 03/01/2017 PCP: [name], MD    Brief Narrative:  51 y.o. 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 He required intubation for acute respiratory failure 2nd to influenza A with pneumonia.  Significant Events: 04/29 - Admit / intubated in ED for respiratory failure 05/01 - Started on Solu-Medrol 05/03 - Solu-Medrol stopped  05/04 - Transitioned off pressor support (Levophed) & started on Prednisone from Solu-Medrol 05/05 - Precedex started & Fleet Enema given 05/06 - new fever & cultures obtained w/ broadening of antibiotics. 05/07 - Precedex stopped. Patient given 2 doses of Lactulose via tube. Reglan IV started. 05/08 - Tele Sitter ordered for patient safety due to staff concern that mother removed central venous catheter & lateral transfer from CCU to MICU 05/09 - Tube feeding stopped 5/12 [facility] assumed care   Subjective: The patient is resting comfortably in bed.  He tolerated BiPAP last night without difficulty per his history.  He denies current shortness of breath nausea vomiting or abdominal pain.  He denies chest pain.  He is very thirsty and tells me his rated to begin a diet.  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 / pulm edema TTE 03/03/2017 noted EF 50-55%, no WMA, and grade 1 DD - net negative ~2.3L since admit - follow wgts and Is/Os Filed Weights   03/11/17 0318 03/12/17 0500 03/14/17 0407  Weight: (!) 166.5 kg (367 lb) (!) 161.9 kg (357 lb) (!) 158.5 kg (349 lb 6.4 oz)    OSA/OHS ?Noncompliant w/ home CPAP use - BiPAP ordered daily at bedtime and as needed per PCCM - tolerating well  thus far    Sepsis - possible RLL PNA v/s atx Completed empiric antibody therapy - sepsis physiology resolved  Influenza A pneumonia Has completed a course of Tamiflu  Acute renal injury  crt slowly improving - follow trend   Recent Labs Lab 03/10/17 0600 03/11/17 0628 03/12/17 0502 03/13/17 0202 03/14/17 0401  CREATININE 1.49* 1.93* 1.86* 1.47* 1.31*  1.28*    Colonic ileus on Reglan - begin clear liquids and follow - if tolerates can advance prn   Acute encephalopathy Minimize sedating meds - ambulate - appears mental status is improving   COPD with acute exacerbation Cont medical tx - wean steroids - needs outpt PFTs but does NOT need home nebs per PCCM - no active wheezing today   Paroxysmal atrial fibrillation No systemic anticoagulation given history of nonadherence - maintaining NSR presently  HTN BP poorly controlled - adjust medical tx and follow  DM2 CBG reasonably controlled   Morbid Obesity - Body mass index is 47.39 kg/m.   DVT prophylaxis: SQ heparin Code Status: DO NOT INTUBATE Family Communication: spoke w/ sister and mother  Disposition Plan: SDU  Consultants:  PCCM  Antimicrobials:  Rocephin 4/29 - 5/4 Azithromycin 4/29 - 5/1 Tamiflu 4/30 - 5/4 Vancomycin 4/30 - 5/2 + 5/6 - 5/9 Zosyn 5/6 > 5/12  Objective: Blood pressure (!) 191/97, pulse 74, temperature 98.3 F (36.8 C), temperature source Oral, resp. rate (!) 22, height 6' (1.829 m), weight (!) 158.5 kg (349 lb 6.4 oz), SpO2 95 %.  Intake/Output Summary (  Last 24 hours) at 03/14/17 1009 Last data filed at 03/14/17 0900  Gross per 24 hour  Intake              950 ml  Output             1000 ml  Net              -50 ml   Filed Weights   03/11/17 0318 03/12/17 0500 03/14/17 0407  Weight: (!) 166.5 kg (367 lb) (!) 161.9 kg (357 lb) (!) 158.5 kg (349 lb 6.4 oz)    Examination: General: No acute respiratory distress Lungs: Clear to auscultation bilaterally without wheezes or  crackles - distant BS th/o  Cardiovascular: Regular rate and rhythm without murmur - distant BS  Abdomen: Nontender, morbidly obese, soft, bowel sounds positive, no rebound, no ascites, no appreciable mass Extremities: trace B LE edema   CBC:  Recent Labs Lab 03/08/17 2242 03/09/17 0431 03/10/17 0600 03/12/17 0502 03/13/17 0202 03/14/17 0401  WBC 14.6* 14.3* 18.1* 12.8* 9.3 9.1  NEUTROABS 10.5*  --   --  9.4* 5.6 5.7  HGB 14.8 14.2 14.7 12.1* 12.2* 13.0  HCT 47.2 45.9 46.5 39.9 39.7 42.4  MCV 97.9 98.7 98.1 98.8 99.7 99.3  PLT 179 180 215 203 197 198   Basic Metabolic Panel:  Recent Labs Lab 03/08/17 0339  03/09/17 0431 03/10/17 0600 03/11/17 0628 03/12/17 0502 03/13/17 0202 03/14/17 0401  NA 145  < > 147* 147* 149* 149* 149* 151*  149*  K 3.6  < > 3.4* 3.3* 4.0 3.8 3.2* 3.6  3.5  CL 102  < > 108 106 108 110 113* 114*  113*  CO2 33*  < > 31 28 27 28 28 26  26   GLUCOSE 217*  < > 204* 191* 136* 180* 134* 139*  139*  BUN 38*  < > 33* 35* 38* 43* 32* 25*  25*  CREATININE 1.25*  < > 1.30* 1.49* 1.93* 1.86* 1.47* 1.31*  1.28*  CALCIUM 8.3*  < > 8.2* 8.5* 8.7* 8.3* 8.2* 8.4*  8.4*  MG 2.1  --  2.2 2.2  --  2.3 2.2 2.4  PHOS 2.9  --  2.8  --   --  4.1 2.4* 3.3  < > = values in this interval not displayed. GFR: Estimated Creatinine Clearance: 108.6 mL/min (A) (by C-G formula based on SCr of 1.28 mg/dL (H)).  Liver Function Tests:  Recent Labs Lab 03/12/17 0502 03/13/17 0202 03/14/17 0401  ALBUMIN 2.5* 2.6* 2.6*    Recent Labs Lab 03/08/17 2242  LIPASE 19  AMYLASE 82    HbA1C: Hgb A1c MFr Bld  Date/Time Value Ref Range Status  03/01/2017 06:40 PM 8.2 (H) 4.8 - 5.6 % Final    Comment:    (NOTE)         Pre-diabetes: 5.7 - 6.4         Diabetes: >6.4         Glycemic control for adults with diabetes: <7.0     CBG:  Recent Labs Lab 03/13/17 1549 03/13/17 1934 03/13/17 2319 03/14/17 0459 03/14/17 0919  GLUCAP 146* 140* 110* 148* 118*     Recent Results (from the past 240 hour(s))  Culture, blood (Routine X 2) w Reflex to ID Panel     Status: None   Collection Time: 03/08/17 11:00 PM  Result Value Ref Range Status   Specimen Description BLOOD RIGHT ARM  Final   Special Requests  Final    BOTTLES DRAWN AEROBIC AND ANAEROBIC Blood Culture adequate volume   Culture NO GROWTH 5 DAYS  Final   Report Status 03/13/2017 FINAL  Final  Culture, blood (Routine X 2) w Reflex to ID Panel     Status: None   Collection Time: 03/08/17 11:08 PM  Result Value Ref Range Status   Specimen Description BLOOD LEFT ANTECUBITAL  Final   Special Requests   Final    BOTTLES DRAWN AEROBIC ONLY Blood Culture adequate volume   Culture NO GROWTH 5 DAYS  Final   Report Status 03/13/2017 FINAL  Final  Culture, respiratory (NON-Expectorated)     Status: None   Collection Time: 03/09/17 10:09 PM  Result Value Ref Range Status   Specimen Description TRACHEAL ASPIRATE  Final   Special Requests Normal  Final   Gram Stain   Final    ABUNDANT WBC PRESENT,BOTH PMN AND MONONUCLEAR NO ORGANISMS SEEN    Culture Consistent with normal respiratory flora.  Final   Report Status 03/12/2017 FINAL  Final     Scheduled Meds: . budesonide (PULMICORT) nebulizer solution  0.5 mg Nebulization BID  . heparin subcutaneous  5,000 Units Subcutaneous Q8H  . insulin aspart  0-9 Units Subcutaneous Q4H  . insulin glargine  10 Units Subcutaneous Daily  . ipratropium-albuterol  3 mL Nebulization Q6H  . mouth rinse  15 mL Mouth Rinse BID  . methylPREDNISolone (SOLU-MEDROL) injection  20 mg Intravenous Daily  . metoCLOPramide (REGLAN) injection  10 mg Intravenous Q8H  . metoprolol  5 mg Intravenous Q6H  . pantoprazole (PROTONIX) IV  40 mg Intravenous Q24H     LOS: 13 days   Lonia Blood, MD [facility] Office  [phone] Pager - Text Page per Loretha Stapler as per below:  On-Call/Text Page:      [email or web address]      password [email or web address]  If 7PM-7AM, please  contact night-coverage [email or web address] Password [email or web address] 03/14/2017, 10:09 AM

## 2017-03-14 NOTE — Progress Notes (Signed)
Pt taken off bipap and placed on 4L [place] .  Pt is tolerating well, O2 sats 95%.  RT will continue to monitor

## 2017-03-14 NOTE — Progress Notes (Signed)
Report given to telesitter.

## 2017-03-15 ENCOUNTER — Inpatient Hospital Stay (HOSPITAL_COMMUNITY): Payer: BLUE CROSS/BLUE SHIELD

## 2017-03-15 LAB — COMPREHENSIVE METABOLIC PANEL
ALBUMIN: 2.7 g/dL — AB (ref 3.5–5.0)
ALK PHOS: 67 U/L (ref 38–126)
ALT: 55 U/L (ref 17–63)
ANION GAP: 7 (ref 5–15)
AST: 37 U/L (ref 15–41)
BUN: 23 mg/dL — ABNORMAL HIGH (ref 6–20)
CALCIUM: 8.2 mg/dL — AB (ref 8.9–10.3)
CO2: 27 mmol/L (ref 22–32)
Chloride: 113 mmol/L — ABNORMAL HIGH (ref 101–111)
Creatinine, Ser: 1.37 mg/dL — ABNORMAL HIGH (ref 0.61–1.24)
GFR calc non Af Amer: 59 mL/min — ABNORMAL LOW (ref >60)
GLUCOSE: 123 mg/dL — AB (ref 65–99)
POTASSIUM: 3.3 mmol/L — AB (ref 3.5–5.1)
SODIUM: 147 mmol/L — AB (ref 135–145)
Total Bilirubin: 0.8 mg/dL (ref 0.3–1.2)
Total Protein: 6.9 g/dL (ref 6.5–8.1)

## 2017-03-15 LAB — CBC
HEMATOCRIT: 40.3 % (ref 39.0–52.0)
HEMOGLOBIN: 12.5 g/dL — AB (ref 13.0–17.0)
MCH: 30.7 pg (ref 26.0–34.0)
MCHC: 31 g/dL (ref 30.0–36.0)
MCV: 99 fL (ref 78.0–100.0)
Platelets: 192 10*3/uL (ref 150–400)
RBC: 4.07 MIL/uL — AB (ref 4.22–5.81)
RDW: 14.5 % (ref 11.5–15.5)
WBC: 10.3 10*3/uL (ref 4.0–10.5)

## 2017-03-15 LAB — GLUCOSE, CAPILLARY
GLUCOSE-CAPILLARY: 121 mg/dL — AB (ref 65–99)
Glucose-Capillary: 123 mg/dL — ABNORMAL HIGH (ref 65–99)
Glucose-Capillary: 232 mg/dL — ABNORMAL HIGH (ref 65–99)
Glucose-Capillary: 230 mg/dL — ABNORMAL HIGH (ref 65–99)
Glucose-Capillary: 148 mg/dL — ABNORMAL HIGH (ref 65–99)
Glucose-Capillary: 182 mg/dL — ABNORMAL HIGH (ref 65–99)

## 2017-03-15 LAB — BRAIN NATRIURETIC PEPTIDE: B NATRIURETIC PEPTIDE 5: 261 pg/mL — AB (ref 0.0–100.0)

## 2017-03-15 MED ORDER — FAMOTIDINE 20 MG PO TABS
20.0000 mg | ORAL_TABLET | Freq: Two times a day (BID) | ORAL | Status: DC | PRN
  Administered 2017-03-16: 20 mg via ORAL
  Filled 2017-03-15: qty 1

## 2017-03-15 MED ORDER — FUROSEMIDE 10 MG/ML IJ SOLN
INTRAMUSCULAR | Status: AC
  Administered 2017-03-15: 20 mg via INTRAVENOUS
  Filled 2017-03-15: qty 2

## 2017-03-15 MED ORDER — FUROSEMIDE 10 MG/ML IJ SOLN
20.0000 mg | Freq: Once | INTRAMUSCULAR | Status: AC
  Administered 2017-03-15: 20 mg via INTRAVENOUS

## 2017-03-15 NOTE — Progress Notes (Signed)
Pt left off O2 after neb tx.  Pt doesn't wear O2 at home.  Sats currently 92% on RA.  RT will continue to monitor.

## 2017-03-15 NOTE — Progress Notes (Signed)
[place] TEAM 1 - Stepdown/ICU TEAM  Luis [name]  VHQ:[id number] DOB: Apr 01, 1966 DOA: 03/01/2017 PCP: [name], MD    Brief Narrative:  51 y.o. 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 He required intubation for acute respiratory failure 2nd to influenza A with pneumonia.  Significant Events: 04/29 - Admit / intubated in ED for respiratory failure 05/01 - Started on Solu-Medrol 05/03 - Solu-Medrol stopped  05/04 - Transitioned off pressor support (Levophed) & started on Prednisone from Solu-Medrol 05/05 - Precedex started & Fleet Enema given 05/06 - new fever & cultures obtained w/ broadening of antibiotics. 05/07 - Precedex stopped. Patient given 2 doses of Lactulose via tube. Reglan IV started. 05/08 - Tele Sitter ordered for patient safety due to staff concern that mother removed central venous catheter & lateral transfer from CCU to MICU 05/09 - Tube feeding stopped 5/12 [facility] assumed care   Subjective: Patient says he wants to go home. Says his breathing is better. Used bipap overnight. Feels very weak. Agreeable to stay until tomorrow to work with physical therapy again  Assessment & Plan:  Acute hypoxic and hypercarbic respiratory failure Multifactorial - improving - cont to wean O2 as able   Acute on chronic Combined systolic and diastolic CHF w/ acute exacerbation / pulm edema TTE 03/03/2017 noted EF 50-55%, no WMA, and grade 1 DD - net negative ~3.8L since admit - follow wgts and Is/Os. Weight is down 11 lbs since admit, although this may not be accurate. He is currently on IV fluids that will be discontinued. He received on dose of lasix overnight with good effect. Chest xray from this morning does not show any significant pulmonary edema. Filed Weights   03/12/17 0500 03/14/17 0407 03/15/17 0330  Weight: (!) 161.9  kg (357 lb) (!) 158.5 kg (349 lb 6.4 oz) (!) 157 kg (346 lb 2 oz)    OSA/OHS ?Noncompliant w/ home CPAP use - BiPAP ordered daily at bedtime and as needed per PCCM - tolerating well thus far    Sepsis - possible RLL PNA v/s atx Completed empiric antibiotic therapy - sepsis physiology resolved  Influenza A pneumonia Has completed a course of Tamiflu  Acute renal injury  crt slowly improving - follow trend   Recent Labs Lab 03/11/17 0628 03/12/17 0502 03/13/17 0202 03/14/17 0401 03/15/17 0311  CREATININE 1.93* 1.86* 1.47* 1.31*  1.28* 1.37*    Colonic ileus on Reglan -tolerating solid diet   Acute encephalopathy Minimize sedating meds - ambulate - appears mental status is improving   COPD with acute exacerbation Cont medical tx - with bronchodilators. He is not on any steroids at this time. No evidence of wheezing. He did require bipap overnight. Continue to monitor. Needs outpt PFTs but does NOT need home nebs per PCCM - no active wheezing today   Paroxysmal atrial fibrillation No systemic anticoagulation given history of nonadherence - maintaining NSR presently  HTN BP poorly controlled - adjust medical tx and follow  DM2 CBG reasonably controlled   Morbid Obesity - Body mass index is 46.94 kg/m.   DVT prophylaxis: SQ heparin Code Status: DO NOT INTUBATE Family Communication: no family present  Disposition Plan: SDU, PT/OT have recommended CIR  Consultants:  PCCM  Antimicrobials:  Rocephin 4/29 - 5/4 Azithromycin 4/29 - 5/1 Tamiflu 4/30 - 5/4 Vancomycin 4/30 - 5/2 + 5/6 -  5/9 Zosyn 5/6 > 5/12  Objective: Blood pressure 139/75, pulse 81, temperature 98.9 F (37.2 C), temperature source Oral, resp. rate (!) 24, height 6' (1.829 m), weight (!) 157 kg (346 lb 2 oz), SpO2 95 %.  Intake/Output Summary (Last 24 hours) at 03/15/17 1501 Last data filed at 03/15/17 1200  Gross per 24 hour  Intake           785.83 ml  Output             2450 ml  Net          -1664.17 ml   Filed Weights   03/12/17 0500 03/14/17 0407 03/15/17 0330  Weight: (!) 161.9 kg (357 lb) (!) 158.5 kg (349 lb 6.4 oz) (!) 157 kg (346 lb 2 oz)    Examination: General: No acute respiratory distress Lungs: clear bilaterally Cardiovascular: RRR without murmur - distant BS  Abdomen: Nontender, morbidly obese, soft, bowel sounds positive, no rebound, no ascites, no appreciable mass Extremities: no edema B LE   CBC:  Recent Labs Lab 03/08/17 2242  03/10/17 0600 03/12/17 0502 03/13/17 0202 03/14/17 0401 03/15/17 0311  WBC 14.6*  < > 18.1* 12.8* 9.3 9.1 10.3  NEUTROABS 10.5*  --   --  9.4* 5.6 5.7  --   HGB 14.8  < > 14.7 12.1* 12.2* 13.0 12.5*  HCT 47.2  < > 46.5 39.9 39.7 42.4 40.3  MCV 97.9  < > 98.1 98.8 99.7 99.3 99.0  PLT 179  < > 215 203 197 198 192  < > = values in this interval not displayed. Basic Metabolic Panel:  Recent Labs Lab 03/09/17 0431 03/10/17 0600 03/11/17 0628 03/12/17 0502 03/13/17 0202 03/14/17 0401 03/15/17 0311  NA 147* 147* 149* 149* 149* 151*  149* 147*  K 3.4* 3.3* 4.0 3.8 3.2* 3.6  3.5 3.3*  CL 108 106 108 110 113* 114*  113* 113*  CO2 31 28 27 28 28 26  26 27   GLUCOSE 204* 191* 136* 180* 134* 139*  139* 123*  BUN 33* 35* 38* 43* 32* 25*  25* 23*  CREATININE 1.30* 1.49* 1.93* 1.86* 1.47* 1.31*  1.28* 1.37*  CALCIUM 8.2* 8.5* 8.7* 8.3* 8.2* 8.4*  8.4* 8.2*  MG 2.2 2.2  --  2.3 2.2 2.4  --   PHOS 2.8  --   --  4.1 2.4* 3.3  --    GFR: Estimated Creatinine Clearance: 100.9 mL/min (A) (by C-G formula based on SCr of 1.37 mg/dL (H)).  Liver Function Tests:  Recent Labs Lab 03/12/17 0502 03/13/17 0202 03/14/17 0401 03/15/17 0311  AST  --   --   --  37  ALT  --   --   --  55  ALKPHOS  --   --   --  67  BILITOT  --   --   --  0.8  PROT  --   --   --  6.9  ALBUMIN 2.5* 2.6* 2.6* 2.7*    Recent Labs Lab 03/08/17 2242  LIPASE 19  AMYLASE 82    HbA1C: Hgb A1c MFr Bld  Date/Time Value Ref Range Status    03/01/2017 06:40 PM 8.2 (H) 4.8 - 5.6 % Final    Comment:    (NOTE)         Pre-diabetes: 5.7 - 6.4         Diabetes: >6.4         Glycemic control for adults with diabetes: <7.0  CBG:  Recent Labs Lab 03/14/17 2102 03/15/17 0022 03/15/17 0408 03/15/17 0838 03/15/17 1142  GLUCAP 87 121* 123* 232* 230*    Recent Results (from the past 240 hour(s))  Culture, blood (Routine X 2) w Reflex to ID Panel     Status: None   Collection Time: 03/08/17 11:00 PM  Result Value Ref Range Status   Specimen Description BLOOD RIGHT ARM  Final   Special Requests   Final    BOTTLES DRAWN AEROBIC AND ANAEROBIC Blood Culture adequate volume   Culture NO GROWTH 5 DAYS  Final   Report Status 03/13/2017 FINAL  Final  Culture, blood (Routine X 2) w Reflex to ID Panel     Status: None   Collection Time: 03/08/17 11:08 PM  Result Value Ref Range Status   Specimen Description BLOOD LEFT ANTECUBITAL  Final   Special Requests   Final    BOTTLES DRAWN AEROBIC ONLY Blood Culture adequate volume   Culture NO GROWTH 5 DAYS  Final   Report Status 03/13/2017 FINAL  Final  Culture, respiratory (NON-Expectorated)     Status: None   Collection Time: 03/09/17 10:09 PM  Result Value Ref Range Status   Specimen Description TRACHEAL ASPIRATE  Final   Special Requests Normal  Final   Gram Stain   Final    ABUNDANT WBC PRESENT,BOTH PMN AND MONONUCLEAR NO ORGANISMS SEEN    Culture Consistent with normal respiratory flora.  Final   Report Status 03/12/2017 FINAL  Final     Scheduled Meds: . budesonide (PULMICORT) nebulizer solution  0.5 mg Nebulization BID  . heparin subcutaneous  5,000 Units Subcutaneous Q8H  . insulin aspart  0-5 Units Subcutaneous QHS  . insulin aspart  0-9 Units Subcutaneous TID WC  . insulin glargine  10 Units Subcutaneous Daily  . ipratropium-albuterol  3 mL Nebulization Q6H  . mouth rinse  15 mL Mouth Rinse BID  . metoCLOPramide (REGLAN) injection  10 mg Intravenous Q8H  .  metoprolol  5 mg Intravenous Q6H  . pantoprazole  40 mg Oral Q1200     LOS: 14 days   Erick Blinks, MD [facility] Office  [phone] Pager - Text Page per Loretha Stapler as per below:  On-Call/Text Page:      [email or web address]      password [email or web address]  If 7PM-7AM, please contact night-coverage [email or web address] Password [email or web address] 03/15/2017, 3:01 PM

## 2017-03-15 NOTE — Progress Notes (Signed)
Telesitter called, patient trying to get OOB. RN went into room and patient was trying to get up but couldn't because he was too weak. He couldn't lift his legs up either to put back in bed. RN assisted legs back in bed and with the help of 3 other nurses we pulled him up in the bed. Sister arrived just as we got him repositioned.

## 2017-03-16 DIAGNOSIS — I10 Essential (primary) hypertension: Secondary | ICD-10-CM

## 2017-03-16 DIAGNOSIS — E669 Obesity, unspecified: Secondary | ICD-10-CM

## 2017-03-16 DIAGNOSIS — K567 Ileus, unspecified: Secondary | ICD-10-CM

## 2017-03-16 DIAGNOSIS — I428 Other cardiomyopathies: Secondary | ICD-10-CM

## 2017-03-16 DIAGNOSIS — Z9119 Patient's noncompliance with other medical treatment and regimen: Secondary | ICD-10-CM

## 2017-03-16 DIAGNOSIS — E1169 Type 2 diabetes mellitus with other specified complication: Secondary | ICD-10-CM

## 2017-03-16 DIAGNOSIS — Z91199 Patient's noncompliance with other medical treatment and regimen due to unspecified reason: Secondary | ICD-10-CM

## 2017-03-16 DIAGNOSIS — Z87891 Personal history of nicotine dependence: Secondary | ICD-10-CM

## 2017-03-16 DIAGNOSIS — R000 Tachycardia, unspecified: Secondary | ICD-10-CM

## 2017-03-16 DIAGNOSIS — J90 Pleural effusion, not elsewhere classified: Secondary | ICD-10-CM

## 2017-03-16 DIAGNOSIS — R0682 Tachypnea, not elsewhere classified: Secondary | ICD-10-CM

## 2017-03-16 DIAGNOSIS — Z720 Tobacco use: Secondary | ICD-10-CM

## 2017-03-16 LAB — BASIC METABOLIC PANEL
Anion gap: 11 (ref 5–15)
BUN: 24 mg/dL — ABNORMAL HIGH (ref 6–20)
CALCIUM: 8.1 mg/dL — AB (ref 8.9–10.3)
CO2: 25 mmol/L (ref 22–32)
Chloride: 108 mmol/L (ref 101–111)
Creatinine, Ser: 1.34 mg/dL — ABNORMAL HIGH (ref 0.61–1.24)
GFR calc non Af Amer: >60 mL/min (ref >60)
Glucose, Bld: 147 mg/dL — ABNORMAL HIGH (ref 65–99)
Potassium: 2.9 mmol/L — ABNORMAL LOW (ref 3.5–5.1)
Sodium: 144 mmol/L (ref 135–145)

## 2017-03-16 LAB — CBC
HEMATOCRIT: 39.8 % (ref 39.0–52.0)
HEMOGLOBIN: 12.6 g/dL — AB (ref 13.0–17.0)
MCH: 30.6 pg (ref 26.0–34.0)
MCHC: 31.7 g/dL (ref 30.0–36.0)
MCV: 96.6 fL (ref 78.0–100.0)
Platelets: 202 10*3/uL (ref 150–400)
RBC: 4.12 MIL/uL — ABNORMAL LOW (ref 4.22–5.81)
RDW: 14.5 % (ref 11.5–15.5)
WBC: 9.7 10*3/uL (ref 4.0–10.5)

## 2017-03-16 LAB — GLUCOSE, CAPILLARY
GLUCOSE-CAPILLARY: 157 mg/dL — AB (ref 65–99)
GLUCOSE-CAPILLARY: 119 mg/dL — AB (ref 65–99)
Glucose-Capillary: 114 mg/dL — ABNORMAL HIGH (ref 65–99)
Glucose-Capillary: 199 mg/dL — ABNORMAL HIGH (ref 65–99)
Glucose-Capillary: 190 mg/dL — ABNORMAL HIGH (ref 65–99)

## 2017-03-16 LAB — MAGNESIUM: MAGNESIUM: 2.2 mg/dL (ref 1.7–2.4)

## 2017-03-16 MED ORDER — METOPROLOL TARTRATE 25 MG PO TABS
37.5000 mg | ORAL_TABLET | Freq: Two times a day (BID) | ORAL | Status: DC
  Administered 2017-03-16 – 2017-03-17 (×3): 37.5 mg via ORAL
  Filled 2017-03-16 (×3): qty 1

## 2017-03-16 MED ORDER — POTASSIUM CHLORIDE CRYS ER 20 MEQ PO TBCR
60.0000 meq | EXTENDED_RELEASE_TABLET | Freq: Once | ORAL | Status: AC
  Administered 2017-03-16: 60 meq via ORAL
  Filled 2017-03-16: qty 3

## 2017-03-16 MED ORDER — ENSURE ENLIVE PO LIQD: 237.0000 mL | Freq: Two times a day (BID) | ORAL | Status: DC

## 2017-03-16 MED ORDER — METOPROLOL TARTRATE 50 MG PO TABS: 50.0000 mg | ORAL_TABLET | Freq: Two times a day (BID) | ORAL | Status: DC

## 2017-03-16 NOTE — Progress Notes (Signed)
Occupational Therapy Treatment Patient Details Name: Luis [name] MRN: [id number] DOB: 1966-08-25 Today's Date: 03/16/2017    History of present illness 51 yo admitted with flu A and PNA, heart failure, VDRF 4/30-5/10. PMHx:HTN, obesity, Afib, CHF, NICM, HLD, DM   OT comments  This 51 yo male admitted with above presents to acute OT today making progress with grooming (goal updated today to standing instead of sitting), able to use arms functionally for transfers and basic ADLs, decreased A needed for sit<>stand. He will continue to benefit from acute OT with follow up OT on CIR to get back to his PLOF of totally independent with basic and IADLs.  Follow Up Recommendations  CIR;Supervision/Assistance - 24 hour    Equipment Recommendations  3 in 1 bedside commode       Precautions / Restrictions Precautions Precautions: Fall Restrictions Weight Bearing Restrictions: No       Mobility Bed Mobility Overal bed mobility: Needs Assistance Bed Mobility: Supine to Sit     Supine to sit: Min guard;HOB elevated        Transfers Overall transfer level: Needs assistance Equipment used: Rolling walker (2 wheeled) Transfers: Sit to/from Stand Sit to Stand: Mod assist;+2 physical assistance         General transfer comment: from regular height surface    Balance Overall balance assessment: Needs assistance Sitting-balance support: No upper extremity supported;Feet supported Sitting balance-Leahy Scale: Good     Standing balance support: Bilateral upper extremity supported Standing balance-Leahy Scale: Poor Standing balance comment: reliant on RW in standing--reports his legs feel like they weigh a ton                           ADL either performed or assessed with clinical judgement   ADL Overall ADL's : Needs assistance/impaired     Grooming: Oral care;Set up Grooming Details (indicate cue type and reason): sitting unsupported on side of bed                                      Vision Patient Visual Report: No change from baseline            Cognition Arousal/Alertness: Awake/alert Behavior During Therapy: WFL for tasks assessed/performed Overall Cognitive Status: Within Functional Limits for tasks assessed                                                     Pertinent Vitals/ Pain       Pain Assessment: No/denies pain         Frequency  Min 2X/week        Progress Toward Goals  OT Goals(current goals can now be found in the care plan section)  Progress towards OT goals: Progressing toward goals     Plan Discharge plan remains appropriate    Co-evaluation    PT/OT/SLP Co-Evaluation/Treatment: Yes Reason for Co-Treatment: For patient/therapist safety   OT goals addressed during session: ADL's and self-care;Strengthening/ROM      AM-PAC PT "6 Clicks" Daily Activity     Outcome Measure   Help from another person eating meals?: None Help from another person taking care of personal grooming?: None Help from another person toileting, which includes using toliet,  bedpan, or urinal?: A Lot Help from another person bathing (including washing, rinsing, drying)?: A Lot Help from another person to put on and taking off regular upper body clothing?: A Little Help from another person to put on and taking off regular lower body clothing?: A Lot 6 Click Score: 17    End of Session Equipment Utilized During Treatment: Gait belt;Rolling walker  OT Visit Diagnosis: Unsteadiness on feet (R26.81);Muscle weakness (generalized) (M62.81)   Activity Tolerance Patient tolerated treatment well   Patient Left  (sitting EOB)   Nurse Communication  (RN OK'd pt to stay sitting EOB after we left room)        Time: 1023-1050 OT Time Calculation (min): 27 min  Charges: OT General Charges $OT Visit: 1 Procedure OT Treatments $Self Care/Home Management : 8-22 mins  Ignacia Palma,  OTR/L [phone] 03/16/2017

## 2017-03-16 NOTE — Progress Notes (Signed)
I met with pt at bedside and then with his permission contacted his Mom, by phone. We discussed a possible inpt rehab admission pending [employer]BS approval and caregiver support at home. Mom can provide supervision level. Pt and his Mom live together and pt is raising 3 grandchildren ages 43 to 49 years old. Pt's sister lives in [place]. I will begin working with [employer] for approval. Mom does not want SNF at this time for her son.[phone]

## 2017-03-16 NOTE — Progress Notes (Signed)
Spoke with patient about wearing Bipap tonight. Patient states he does not wish to wear Bipap tonight. RT explained to patient to have the RN call if he changed his mind. RT will continue to monitor.

## 2017-03-16 NOTE — Progress Notes (Signed)
Nutrition Follow-up  DOCUMENTATION CODES:   Morbid obesity  INTERVENTION:   -Ensure Enlive po BID, each supplement provides 350 kcal and 20 grams of protein, until pt demonstrating adequate PO   NUTRITION DIAGNOSIS:   Inadequate oral intake related to inability to eat as evidenced by NPO status.  Continues but being addressed as diet advanced post extubation, supplement ordered  GOAL:   Patient will meet greater than or equal to 90% of their needs   MONITOR:   PO intake, Supplement acceptance, Labs, Weight trends  REASON FOR ASSESSMENT:   Consult Enteral/tube feeding initiation and management  ASSESSMENT:   51 y/o M w/ HTN, hx of NICM per [facility] 03/2016, Afib, 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 Per mother the patient has had worsening DOE, BL LE edema and orthopnea for several weeks  Colonic ileus resolving No recorded po intake, diet advanced to 2g sodium last evening and pt tolerating thus far  Labs: CBGs 148-230, potassium 2.9 (supplemented) Meds: reglan, ss novolog, lantus, KCL  Diet Order:  Diet 2 gram sodium Room service appropriate? Yes; Fluid consistency: Thin  Skin:  Reviewed, no issues  Last BM:  03/15/17  Height:   Ht Readings from Last 1 Encounters:  03/05/17 6' (1.829 m)    Weight:   Wt Readings from Last 1 Encounters:  03/16/17 (!) 349 lb 10.4 oz (158.6 kg)    Ideal Body Weight:  81 kg  BMI:  Body mass index is 47.42 kg/m.  Estimated Nutritional Needs:   Kcal:  2376-2831  Protein:  >/= 202 gm  Fluid:  per MD  EDUCATION NEEDS:   No education needs identified at this time  Romelle Starcher MS, RD, LDN [phone] Pager  [phone] Weekend/On-Call Pager

## 2017-03-16 NOTE — Progress Notes (Signed)
Physical Therapy Treatment Patient Details Name: Luis [name] MRN: [id number] DOB: [birth year] Today's Date: 03/16/2017    History of Present Illness 51 yo admitted with flu A and PNA, heart failure, VDRF 4/30-5/10. PMHx:HTN, obesity, Afib, CHF, NICM, HLD, DM    PT Comments    Patient is making progress with mobility and gait.  Able to move to sitting with no physical assist, and to stand upright x2 with +2 mod assist.  Continue to recommend Inpatient Rehab stay with goal to reach optimal functional level and return home with family.    Follow Up Recommendations  CIR;Supervision/Assistance - 24 hour     Equipment Recommendations  Rolling walker with 5" wheels;3in1 (PT)    Recommendations for Other Services       Precautions / Restrictions Precautions Precautions: Fall Restrictions Weight Bearing Restrictions: No    Mobility  Bed Mobility Overal bed mobility: Needs Assistance Bed Mobility: Supine to Sit     Supine to sit: Min guard;HOB elevated     General bed mobility comments: No physical assist required today.  Assist for safety only.  Patient able to maintain sitting balance at EOB.  Transfers Overall transfer level: Needs assistance Equipment used: Rolling walker (2 wheeled) Transfers: Sit to/from Stand Sit to Stand: Mod assist;+2 physical assistance         General transfer comment: From regular height surface.  Verbal cues for hand placement.  Assist to power up to stance.  Patient stood x2 for approx 2 minutes each.  Able to step in place in stance.  Fatigued quickly with increased RR.  Cues to slow breathing.  Ambulation/Gait             General Gait Details: Unable today   Stairs            Wheelchair Mobility    Modified Rankin (Stroke Patients Only)       Balance Overall balance assessment: Needs assistance Sitting-balance support: No upper extremity supported;Feet supported Sitting balance-Leahy Scale: Good     Standing  balance support: Bilateral upper extremity supported Standing balance-Leahy Scale: Poor Standing balance comment: reliant on RW in standing--reports his legs feel like they weigh a ton                            Cognition Arousal/Alertness: Awake/alert Behavior During Therapy: WFL for tasks assessed/performed Overall Cognitive Status: Within Functional Limits for tasks assessed                                        Exercises      General Comments        Pertinent Vitals/Pain Pain Assessment: No/denies pain    Home Living                      Prior Function            PT Goals (current goals can now be found in the care plan section) Acute Rehab PT Goals Patient Stated Goal: see his grandchildren Progress towards PT goals: Progressing toward goals    Frequency    Min 3X/week      PT Plan Current plan remains appropriate    Co-evaluation PT/OT/SLP Co-Evaluation/Treatment: Yes Reason for Co-Treatment: For patient/therapist safety PT goals addressed during session: Mobility/safety with mobility OT goals addressed during session: ADL's and self-care;Strengthening/ROM  AM-PAC PT "6 Clicks" Daily Activity  Outcome Measure  Difficulty turning over in bed (including adjusting bedclothes, sheets and blankets)?: A Little Difficulty moving from lying on back to sitting on the side of the bed? : A Little Difficulty sitting down on and standing up from a chair with arms (e.g., wheelchair, bedside commode, etc,.)?: Total Help needed moving to and from a bed to chair (including a wheelchair)?: A Lot Help needed walking in hospital room?: Total Help needed climbing 3-5 steps with a railing? : Total 6 Click Score: 11    End of Session Equipment Utilized During Treatment: Gait belt;Oxygen Activity Tolerance: Patient tolerated treatment well Patient left: in bed;with call bell/phone within reach;with family/visitor present (sitting  EOB with nsg aware) Nurse Communication: Mobility status (Sitting EOB) PT Visit Diagnosis: Muscle weakness (generalized) (M62.81);Difficulty in walking, not elsewhere classified (R26.2);Other abnormalities of gait and mobility (R26.89)     Time: 1023-1050 PT Time Calculation (min) (ACUTE ONLY): 27 min  Charges:  $Therapeutic Activity: 8-22 mins                    G Codes:       Durenda Hurt. Renaldo Fiddler, [facility] Acute Rehab Services Pager [phone]    Vena Austria 03/16/2017, 1:32 PM

## 2017-03-16 NOTE — Progress Notes (Signed)
Lonie Newsham  ZOX:[id number] DOB: Jun 25, 1966 DOA: 03/01/2017 PCP: [name], MD    Brief Narrative:  51 y.o. 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 He required intubation for acute respiratory failure 2nd to influenza A with pneumonia.  Significant Events: 04/29 - Admit / intubated in ED for respiratory failure 05/01 - Started on Solu-Medrol 05/03 - Solu-Medrol stopped  05/04 - Transitioned off pressor support (Levophed) & started on Prednisone from Solu-Medrol 05/05 - Precedex started & Fleet Enema given 05/06 - new fever & cultures obtained w/ broadening of antibiotics. 05/07 - Precedex stopped. Patient given 2 doses of Lactulose via tube. Reglan IV started. 05/08 - Tele Sitter ordered for patient safety due to staff concern that mother removed central venous catheter & lateral transfer from CCU to MICU 05/09 - Tube feeding stopped 5/12 [facility] assumed care   Subjective: Patient without complaints today.   Assessment & Plan:  Acute hypoxic and hypercarbic respiratory failure Multifactorial - improving - cont to wean O2 as able   Acute on chronic Combined systolic and diastolic CHF w/ acute exacerbation / pulm edema TTE 03/03/2017 noted EF 50-55%, no WMA, and grade 1 DD - net negative ~3.8L since admit - follow wgts and Is/Os. Weight is down 11 lbs since admit.    Filed Weights   03/14/17 0407 03/15/17 0330 03/16/17 0318  Weight: (!) 158.5 kg (349 lb 6.4 oz) (!) 157 kg (346 lb 2 oz) (!) 158.6 kg (349 lb 10.4 oz)   OSA/OHS ?Noncompliant w/ home CPAP use - BiPAP ordered daily at bedtime and as needed per PCCM - tolerating well thus far    Sepsis - possible RLL PNA v/s atx Completed empiric antibiotic therapy - sepsis physiology resolved  Influenza A pneumonia Has completed a course of Tamiflu  Acute renal injury  crt  slowly improving - follow trend   Recent Labs Lab 03/12/17 0502 03/13/17 0202 03/14/17 0401 03/15/17 0311 03/16/17 0252  CREATININE 1.86* 1.47* 1.31*  1.28* 1.37* 1.34*   Colonic ileus on Reglan -tolerating solid diet   Acute encephalopathy Minimize sedating meds - ambulate - appears mental status is improving   COPD with acute exacerbation Cont medical tx - with bronchodilators. He is not on any steroids at this time. No evidence of wheezing. He did require bipap overnight. Continue to monitor. Needs outpt PFTs but does NOT need home nebs per PCCM - no active wheezing today   Paroxysmal atrial fibrillation No systemic anticoagulation given history of nonadherence - maintaining NSR presently  HTN BP recently better controlled - adjust medical tx and follow  DM2 CBG reasonably controlled   Morbid Obesity - Body mass index is 47.42 kg/m.   DVT prophylaxis: SQ heparin Code Status: DO NOT INTUBATE Family Communication: no family present  Disposition Plan: SDU, PT/OT have recommended CIR, awaiting evaluation, otherwise SNF  Consultants:  PCCM  Antimicrobials:  Rocephin 4/29 - 5/4 Azithromycin 4/29 - 5/1 Tamiflu 4/30 - 5/4 Vancomycin 4/30 - 5/2 + 5/6 - 5/9 Zosyn 5/6 > 5/12  Objective: Blood pressure 96/73, pulse 92, temperature 98.4 F (36.9 C), temperature source Oral, resp. rate 20, height 6' (1.829 m), weight (!) 158.6 kg (349 lb 10.4 oz), SpO2 96 %.  Intake/Output Summary (Last 24 hours) at 03/16/17 1129 Last data filed at 03/16/17 0826  Gross per 24 hour  Intake  240 ml  Output             1001 ml  Net             -761 ml   Filed Weights   03/14/17 0407 03/15/17 0330 03/16/17 0318  Weight: (!) 158.5 kg (349 lb 6.4 oz) (!) 157 kg (346 lb 2 oz) (!) 158.6 kg (349 lb 10.4 oz)    Examination: General: No acute respiratory distress Lungs: clear bilaterally Cardiovascular: RRR without murmur - distant BS  Abdomen: Nontender, morbidly obese,  soft, bowel sounds positive, no rebound, no ascites, no appreciable mass Extremities: no significant edema BLE   CBC:  Recent Labs Lab 03/12/17 0502 03/13/17 0202 03/14/17 0401 03/15/17 0311 03/16/17 0252  WBC 12.8* 9.3 9.1 10.3 9.7  NEUTROABS 9.4* 5.6 5.7  --   --   HGB 12.1* 12.2* 13.0 12.5* 12.6*  HCT 39.9 39.7 42.4 40.3 39.8  MCV 98.8 99.7 99.3 99.0 96.6  PLT 203 197 198 192 202   Basic Metabolic Panel:  Recent Labs Lab 03/10/17 0600  03/12/17 0502 03/13/17 0202 03/14/17 0401 03/15/17 0311 03/16/17 0252 03/16/17 0732  NA 147*  < > 149* 149* 151*  149* 147* 144  --   K 3.3*  < > 3.8 3.2* 3.6  3.5 3.3* 2.9*  --   CL 106  < > 110 113* 114*  113* 113* 108  --   CO2 28  < > 28 28 26  26 27 25   --   GLUCOSE 191*  < > 180* 134* 139*  139* 123* 147*  --   BUN 35*  < > 43* 32* 25*  25* 23* 24*  --   CREATININE 1.49*  < > 1.86* 1.47* 1.31*  1.28* 1.37* 1.34*  --   CALCIUM 8.5*  < > 8.3* 8.2* 8.4*  8.4* 8.2* 8.1*  --   MG 2.2  --  2.3 2.2 2.4  --   --  2.2  PHOS  --   --  4.1 2.4* 3.3  --   --   --   < > = values in this interval not displayed. GFR: Estimated Creatinine Clearance: 103.8 mL/min (A) (by C-G formula based on SCr of 1.34 mg/dL (H)).  Liver Function Tests:  Recent Labs Lab 03/12/17 0502 03/13/17 0202 03/14/17 0401 03/15/17 0311  AST  --   --   --  37  ALT  --   --   --  55  ALKPHOS  --   --   --  67  BILITOT  --   --   --  0.8  PROT  --   --   --  6.9  ALBUMIN 2.5* 2.6* 2.6* 2.7*   No results for input(s): LIPASE, AMYLASE in the last 168 hours.  HbA1C: Hgb A1c MFr Bld  Date/Time Value Ref Range Status  03/01/2017 06:40 PM 8.2 (H) 4.8 - 5.6 % Final    Comment:    (NOTE)         Pre-diabetes: 5.7 - 6.4         Diabetes: >6.4         Glycemic control for adults with diabetes: <7.0     CBG:  Recent Labs Lab 03/15/17 0838 03/15/17 1142 03/15/17 1559 03/15/17 2131 03/16/17 0817  GLUCAP 232* 230* 148* 182* 114*    Recent  Results (from the past 240 hour(s))  Culture, blood (Routine X 2) w Reflex to ID Panel  Status: None   Collection Time: 03/08/17 11:00 PM  Result Value Ref Range Status   Specimen Description BLOOD RIGHT ARM  Final   Special Requests   Final    BOTTLES DRAWN AEROBIC AND ANAEROBIC Blood Culture adequate volume   Culture NO GROWTH 5 DAYS  Final   Report Status 03/13/2017 FINAL  Final  Culture, blood (Routine X 2) w Reflex to ID Panel     Status: None   Collection Time: 03/08/17 11:08 PM  Result Value Ref Range Status   Specimen Description BLOOD LEFT ANTECUBITAL  Final   Special Requests   Final    BOTTLES DRAWN AEROBIC ONLY Blood Culture adequate volume   Culture NO GROWTH 5 DAYS  Final   Report Status 03/13/2017 FINAL  Final  Culture, respiratory (NON-Expectorated)     Status: None   Collection Time: 03/09/17 10:09 PM  Result Value Ref Range Status   Specimen Description TRACHEAL ASPIRATE  Final   Special Requests Normal  Final   Gram Stain   Final    ABUNDANT WBC PRESENT,BOTH PMN AND MONONUCLEAR NO ORGANISMS SEEN    Culture Consistent with normal respiratory flora.  Final   Report Status 03/12/2017 FINAL  Final     Scheduled Meds: . budesonide (PULMICORT) nebulizer solution  0.5 mg Nebulization BID  . heparin subcutaneous  5,000 Units Subcutaneous Q8H  . insulin aspart  0-5 Units Subcutaneous QHS  . insulin aspart  0-9 Units Subcutaneous TID WC  . insulin glargine  10 Units Subcutaneous Daily  . ipratropium-albuterol  3 mL Nebulization Q6H  . mouth rinse  15 mL Mouth Rinse BID  . metoCLOPramide (REGLAN) injection  10 mg Intravenous Q8H  . metoprolol tartrate  37.5 mg Oral BID  . pantoprazole  40 mg Oral Q1200     LOS: 15 days   Maryln Manuel, MD [facility] Office  [phone] Pager - Text Page per Loretha Stapler as per below:  On-Call/Text Page:      [email or web address]      password [email or web address]  If 7PM-7AM, please contact night-coverage [email or web address] Password [facility] 03/16/2017,  11:29 AM

## 2017-03-16 NOTE — Consult Note (Signed)
Physical Medicine and Rehabilitation Consult Reason for Consult: Debilitation/acute encephalopathy Referring Physician: [facility]   HPI: Luis [name] is a 51 y.o. 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 Noncompliant with medications over the past several months. Patient reported to be on no prescription medications at time of admission. Per chart review and patient, patient independent prior to admission living alone. One level home with 6 steps to entry. Works as Production manager. Family in the area question 24-hour assistance on discharge. Presented 03/01/2017 with productive cough, myalgias as well as increased lower extremity edema. Oxygen saturation 84% on room air placed on CPAP. Chest x-ray reviewed, showing b/l infiltartes. Per report, diffuse patchy bilateral infiltrates as well as cardiomegaly concerning for pulmonary edema. Hyponatremia 132, BNP 243, troponin negative, urine drug screen negative. Patient required intubation and placed on broad-spectrum antibiotics. Maintain on IV diuresis. Echocardiogram with ejection fraction of 55% no wall motion abnormality grade 1 diastolic dysfunction. Patient remains on droplet precautions. Antibiotic therapy course has been completed. Remained intubated through 03/12/2017. Demonstrating some impaired memory and awareness deficits subcutaneous heparin for DVT prophylaxis. Diet has been advanced to a regular consistency. Physical and occupational therapy evaluations completed with recommendations of physical medicine rehabilitation consult.   Review of Systems  Constitutional: Positive for chills and malaise/fatigue. Negative for fever.  HENT: Negative for hearing loss.   Eyes: Negative for blurred vision and double vision.  Respiratory: Positive for cough, shortness of breath and wheezing.     Cardiovascular: Positive for palpitations. Negative for chest pain.  Gastrointestinal: Positive for constipation. Negative for nausea and vomiting.  Genitourinary: Negative for dysuria, flank pain and hematuria.  Musculoskeletal: Positive for joint pain and myalgias.  Skin: Negative for rash.  Neurological: Negative for seizures.  All other systems reviewed and are negative.  Past Medical History:  Diagnosis Date  . Atrial fibrillation (HCC)   . CHF (congestive heart failure) (HCC)   . DM (diabetes mellitus) (HCC)   . HTN (hypertension)   . Hyperlipidemia   . Obesity   . OSA (obstructive sleep apnea)    History reviewed. No pertinent surgical history. History reviewed. No pertinent family history. Social History:  reports that he quit smoking about 8 years ago. He has never used smokeless tobacco. He reports that he does not drink alcohol. His drug history is not on file. Allergies:  Allergies  Allergen Reactions  . Demerol [Meperidine] Itching  . Morphine And Related Itching   Medications Prior to Admission  Medication Sig Dispense Refill  . [DISCONTINUED] DM-APAP-CPM (VICKS NYQUIL COLD & FLU NIGHT PO) Take 15 mLs by mouth every 8 (eight) hours as needed (for symptoms).      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 Status:  Mobility: Bed Mobility Overal bed mobility: Needs Assistance Bed Mobility: Supine to Sit, Sit to Supine Rolling: Mod assist Supine to sit: Min assist Sit to supine: Min assist General bed  mobility comments: cues for technique, assist to raise trunk and for LEs back into bed  Transfers Overall transfer level: Needs assistance Transfers: Sit to/from Stand Sit to Stand: Mod assist, +2 physical assistance General transfer comment: did not attempt this visit      ADL: ADL Overall ADL's : Needs assistance/impaired Eating/Feeding: Supervision/ safety, Sitting Eating/Feeding Details (indicate cue type and reason): drank ginger ale, cues to slow pace Grooming: Oral care, Sitting, Minimal assistance Upper Body Bathing: Moderate assistance, Sitting Lower Body Bathing: Total assistance, Bed level Upper Body Dressing : Moderate assistance, Sitting Lower Body Dressing: Total assistance, Bed level General ADL Comments: Pt sat EOB x 20 minutes with supervision.  Cognition: Cognition Overall Cognitive Status: Impaired/Different from baseline Orientation Level: Oriented X4 Cognition Arousal/Alertness: Awake/alert Behavior During Therapy: WFL for tasks assessed/performed Overall Cognitive Status: Impaired/Different from baseline Area of Impairment: Memory, Safety/judgement Orientation Level: Disoriented to, Time Memory: Decreased short-term memory Safety/Judgement: Decreased awareness of deficits, Decreased awareness of safety  Blood pressure (!) 174/112, pulse 81, temperature 98.6 F (37 C), temperature source Oral, resp. rate (!) 24, height 6' (1.829 m), weight (!) 158.6 kg (349 lb 10.4 oz), SpO2 92 %. Physical Exam  Vitals reviewed. Constitutional: He is oriented to person, place, and time. He appears well-developed.  51 year old obese right-handed male appearing older than stated age  HENT:  Head: Normocephalic and atraumatic.  Eyes: Conjunctivae and EOM are normal.  Neck: Normal range of motion. Neck supple. No thyromegaly present.  Cardiovascular: Regular rhythm.   +Tachycardia  Respiratory:  Decreased breath sounds at the bases with scattered rails +Tachypnea +[place]    GI: Soft. Bowel sounds are normal. He exhibits no distension. There is no tenderness.  Musculoskeletal: He exhibits no edema or tenderness.  Neurological: He is alert and oriented to person, place, and time.  Patient appears anxious and short of breath.  Follows commands Motor: 4-/5 throughout Dysphonia  Skin: Skin is warm and dry.  Psychiatric: He has a normal mood and affect. His behavior is normal.    Results for orders placed or performed during the hospital encounter of 03/01/17 (from the past 24 hour(s))  Glucose, capillary     Status: Abnormal   Collection Time: 03/15/17  8:38 AM  Result Value Ref Range   Glucose-Capillary 232 (H) 65 - 99 mg/dL  Glucose, capillary     Status: Abnormal   Collection Time: 03/15/17 11:42 AM  Result Value Ref Range   Glucose-Capillary 230 (H) 65 - 99 mg/dL  Glucose, capillary     Status: Abnormal   Collection Time: 03/15/17  3:59 PM  Result Value Ref Range   Glucose-Capillary 148 (H) 65 - 99 mg/dL  Glucose, capillary     Status: Abnormal   Collection Time: 03/15/17  9:31 PM  Result Value Ref Range   Glucose-Capillary 182 (H) 65 - 99 mg/dL  CBC     Status: Abnormal   Collection Time: 03/16/17  2:52 AM  Result Value Ref Range   WBC 9.7 4.0 - 10.5 K/uL   RBC 4.12 (L) 4.22 - 5.81 MIL/uL   Hemoglobin 12.6 (L) 13.0 - 17.0 g/dL   HCT 71.6 96.7 - 89.3 %   MCV 96.6 78.0 - 100.0 fL   MCH 30.6 26.0 - 34.0 pg   MCHC 31.7 30.0 - 36.0 g/dL   RDW 81.0 17.5 - 10.2 %   Platelets 202 150 - 400 K/uL  Basic metabolic panel     Status: Abnormal   Collection Time: 03/16/17  2:52 AM  Result Value Ref Range   Sodium 144 135 - 145 mmol/L  Potassium 2.9 (L) 3.5 - 5.1 mmol/L   Chloride 108 101 - 111 mmol/L   CO2 25 22 - 32 mmol/L   Glucose, Bld 147 (H) 65 - 99 mg/dL   BUN 24 (H) 6 - 20 mg/dL   Creatinine, Ser 9.60 (H) 0.61 - 1.24 mg/dL   Calcium 8.1 (L) 8.9 - 10.3 mg/dL   GFR calc non Af Amer >60 >60 mL/min   GFR calc Af Amer >60 >60 mL/min   Anion  gap 11 5 - 15   Dg Chest Port 1 View  Result Date: 03/15/2017 CLINICAL DATA:  Shortness of breath. Atrial fibrillation. Hypertension. Diabetes. Obesity. EXAM: PORTABLE CHEST 1 VIEW COMPARISON:  03/12/2017 FINDINGS: Shallow inspiration. Cardiac enlargement. Pulmonary vascularity is normal. No consolidation or airspace disease in the lungs. No blunting of costophrenic angles. No pneumothorax. Mediastinal contours appear intact. IMPRESSION: Cardiac enlargement.  No edema or consolidation. Electronically Signed   By: Burman Nieves M.D.   On: 03/15/2017 06:23    Assessment/Plan: Diagnosis: Debilitation Labs and images independently reviewed.  Records reviewed and summated above.  1. Does the need for close, 24 hr/day medical supervision in concert with the patient's rehab needs make it unreasonable for this patient to be served in a less intensive setting? Yes 2. Co-Morbidities requiring supervision/potential complications: NICM (cont to monitor), atrial fibrillation (monitor HR with increased activity), combined CHF (Monitor in accordance with increased physical activity and avoid UE resistance excercises), morbid obesity (Body mass index is 47.42 kg/m., diet and exercise education, encourage weight loss to increase endurance and promote overall health), tobacco abuse (counsel), diabetes mellitus (Monitor in accordance with exercise and adjust meds as necessary), HTN (monitor and provide prns in accordance with increased physical exertion and pain, wean IV meds as appropriate), noncompliant (counsel), Tachycardia (monitor in accordance with pain and increasing activity), tachypnea (monitor RR and O2 Sats with increased physical exertion), 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 Due to safety, disease management and patient education, does the patient require 24 hr/day rehab nursing? Yes 4. Does the patient require coordinated care of a physician, rehab nurse, PT (1-2 hrs/day, 5 days/week) and OT (1-2 hrs/day, 5  days/week) to address physical and functional deficits in the context of the above medical diagnosis(es)? Yes Addressing deficits in the following areas: balance, endurance, locomotion, strength, transferring, bathing, dressing, toileting and psychosocial support 5. Can the patient actively participate in an intensive therapy program of at least 3 hrs of therapy per day at least 5 days per week? Not at present 6. The potential for patient to make measurable gains while on inpatient rehab is excellent 7. Anticipated functional outcomes upon discharge from inpatient rehab are supervision  with PT, supervision with OT, n/a with SLP. 8. Estimated rehab length of stay to reach the above functional goals is: 14-18 days. 9. Anticipated D/C setting: Home 10. Anticipated post D/C treatments: HH therapy and Home excercise program 11. Overall Rehab/Functional Prognosis: good  RECOMMENDATIONS: This patient's condition is appropriate for continued rehabilitative care in the following setting: CIR when able to tolerate 3 hours therapy/day. Patient has agreed to participate in recommended program. Yes Note that insurance prior authorization may be required for reimbursement for recommended care.  Comment: Rehab Admissions Coordinator to follow up.  Maryla Morrow, MD, [place] [facility] Charlton Amor., PA-C 03/16/2017

## 2017-03-17 ENCOUNTER — Encounter (HOSPITAL_COMMUNITY): Payer: Self-pay | Admitting: *Deleted

## 2017-03-17 ENCOUNTER — Inpatient Hospital Stay (HOSPITAL_COMMUNITY)
Admission: RE | Admit: 2017-03-17 | Discharge: 2017-03-23 | DRG: 947 | Disposition: A | Discharge status: Home Health | Payer: BLUE CROSS/BLUE SHIELD | Source: Intra-hospital | Attending: Physical Medicine & Rehabilitation | Admitting: Physical Medicine & Rehabilitation

## 2017-03-17 ENCOUNTER — Encounter: Payer: Self-pay | Admitting: Internal Medicine

## 2017-03-17 DIAGNOSIS — Z87891 Personal history of nicotine dependence: Secondary | ICD-10-CM

## 2017-03-17 DIAGNOSIS — J449 Chronic obstructive pulmonary disease, unspecified: Secondary | ICD-10-CM | POA: Diagnosis present

## 2017-03-17 DIAGNOSIS — I5042 Chronic combined systolic (congestive) and diastolic (congestive) heart failure: Secondary | ICD-10-CM | POA: Diagnosis present

## 2017-03-17 DIAGNOSIS — G4733 Obstructive sleep apnea (adult) (pediatric): Secondary | ICD-10-CM | POA: Diagnosis present

## 2017-03-17 DIAGNOSIS — E876 Hypokalemia: Secondary | ICD-10-CM | POA: Diagnosis present

## 2017-03-17 DIAGNOSIS — I428 Other cardiomyopathies: Secondary | ICD-10-CM | POA: Diagnosis not present

## 2017-03-17 DIAGNOSIS — E871 Hypo-osmolality and hyponatremia: Secondary | ICD-10-CM | POA: Diagnosis not present

## 2017-03-17 DIAGNOSIS — I5033 Acute on chronic diastolic (congestive) heart failure: Secondary | ICD-10-CM | POA: Diagnosis not present

## 2017-03-17 DIAGNOSIS — I480 Paroxysmal atrial fibrillation: Secondary | ICD-10-CM | POA: Diagnosis not present

## 2017-03-17 DIAGNOSIS — R52 Pain, unspecified: Secondary | ICD-10-CM

## 2017-03-17 DIAGNOSIS — Z6841 Body Mass Index (BMI) 40.0 and over, adult: Secondary | ICD-10-CM

## 2017-03-17 DIAGNOSIS — R400 Somnolence: Secondary | ICD-10-CM | POA: Diagnosis present

## 2017-03-17 DIAGNOSIS — R0781 Pleurodynia: Secondary | ICD-10-CM | POA: Diagnosis not present

## 2017-03-17 DIAGNOSIS — J9620 Acute and chronic respiratory failure, unspecified whether with hypoxia or hypercapnia: Secondary | ICD-10-CM | POA: Diagnosis not present

## 2017-03-17 DIAGNOSIS — I110 Hypertensive heart disease with heart failure: Secondary | ICD-10-CM | POA: Diagnosis present

## 2017-03-17 DIAGNOSIS — G9340 Encephalopathy, unspecified: Secondary | ICD-10-CM | POA: Diagnosis not present

## 2017-03-17 DIAGNOSIS — R0602 Shortness of breath: Secondary | ICD-10-CM | POA: Diagnosis present

## 2017-03-17 DIAGNOSIS — R1031 Right lower quadrant pain: Secondary | ICD-10-CM | POA: Diagnosis not present

## 2017-03-17 DIAGNOSIS — E1142 Type 2 diabetes mellitus with diabetic polyneuropathy: Secondary | ICD-10-CM | POA: Diagnosis present

## 2017-03-17 DIAGNOSIS — R05 Cough: Secondary | ICD-10-CM

## 2017-03-17 DIAGNOSIS — R1011 Right upper quadrant pain: Secondary | ICD-10-CM

## 2017-03-17 DIAGNOSIS — I5032 Chronic diastolic (congestive) heart failure: Secondary | ICD-10-CM | POA: Diagnosis present

## 2017-03-17 DIAGNOSIS — R5381 Other malaise: Secondary | ICD-10-CM | POA: Diagnosis not present

## 2017-03-17 DIAGNOSIS — J9690 Respiratory failure, unspecified, unspecified whether with hypoxia or hypercapnia: Secondary | ICD-10-CM | POA: Diagnosis present

## 2017-03-17 DIAGNOSIS — N179 Acute kidney failure, unspecified: Secondary | ICD-10-CM | POA: Diagnosis present

## 2017-03-17 DIAGNOSIS — Z885 Allergy status to narcotic agent status: Secondary | ICD-10-CM | POA: Diagnosis not present

## 2017-03-17 DIAGNOSIS — M7989 Other specified soft tissue disorders: Secondary | ICD-10-CM | POA: Diagnosis not present

## 2017-03-17 DIAGNOSIS — I5030 Unspecified diastolic (congestive) heart failure: Secondary | ICD-10-CM | POA: Diagnosis not present

## 2017-03-17 DIAGNOSIS — Z9119 Patient's noncompliance with other medical treatment and regimen: Secondary | ICD-10-CM

## 2017-03-17 DIAGNOSIS — J189 Pneumonia, unspecified organism: Secondary | ICD-10-CM | POA: Diagnosis not present

## 2017-03-17 DIAGNOSIS — M791 Myalgia: Secondary | ICD-10-CM | POA: Diagnosis not present

## 2017-03-17 DIAGNOSIS — M7918 Myalgia, other site: Secondary | ICD-10-CM | POA: Diagnosis not present

## 2017-03-17 DIAGNOSIS — R059 Cough, unspecified: Secondary | ICD-10-CM

## 2017-03-17 DIAGNOSIS — R109 Unspecified abdominal pain: Secondary | ICD-10-CM

## 2017-03-17 LAB — BASIC METABOLIC PANEL
ANION GAP: 9 (ref 5–15)
BUN: 18 mg/dL (ref 6–20)
CO2: 21 mmol/L — ABNORMAL LOW (ref 22–32)
Calcium: 8.3 mg/dL — ABNORMAL LOW (ref 8.9–10.3)
Chloride: 109 mmol/L (ref 101–111)
Creatinine, Ser: 1.28 mg/dL — ABNORMAL HIGH (ref 0.61–1.24)
Glucose, Bld: 180 mg/dL — ABNORMAL HIGH (ref 65–99)
POTASSIUM: 3.3 mmol/L — AB (ref 3.5–5.1)
SODIUM: 139 mmol/L (ref 135–145)

## 2017-03-17 LAB — PHOSPHORUS: PHOSPHORUS: 2.2 mg/dL — AB (ref 2.5–4.6)

## 2017-03-17 LAB — CBC
HEMATOCRIT: 40.9 % (ref 39.0–52.0)
HEMOGLOBIN: 13.3 g/dL (ref 13.0–17.0)
MCH: 31.4 pg (ref 26.0–34.0)
MCHC: 32.5 g/dL (ref 30.0–36.0)
MCV: 96.5 fL (ref 78.0–100.0)
Platelets: 184 10*3/uL (ref 150–400)
RBC: 4.24 MIL/uL (ref 4.22–5.81)
RDW: 14.6 % (ref 11.5–15.5)
WBC: 8.9 10*3/uL (ref 4.0–10.5)

## 2017-03-17 LAB — GLUCOSE, CAPILLARY
GLUCOSE-CAPILLARY: 149 mg/dL — AB (ref 65–99)
GLUCOSE-CAPILLARY: 161 mg/dL — AB (ref 65–99)
GLUCOSE-CAPILLARY: 130 mg/dL — AB (ref 65–99)
Glucose-Capillary: 145 mg/dL — ABNORMAL HIGH (ref 65–99)

## 2017-03-17 LAB — CREATININE, SERUM
Creatinine, Ser: 1.26 mg/dL — ABNORMAL HIGH (ref 0.61–1.24)
GFR calc non Af Amer: >60 mL/min (ref >60)

## 2017-03-17 LAB — MAGNESIUM: Magnesium: 2 mg/dL (ref 1.7–2.4)

## 2017-03-17 MED ORDER — ACETAMINOPHEN 325 MG PO TABS
650.0000 mg | ORAL_TABLET | Freq: Four times a day (QID) | ORAL | Status: DC | PRN
  Administered 2017-03-20 – 2017-03-22 (×4): 650 mg via ORAL
  Filled 2017-03-17 (×5): qty 2

## 2017-03-17 MED ORDER — INSULIN GLARGINE 100 UNIT/ML ~~LOC~~ SOLN
10.0000 [IU] | Freq: Every day | SUBCUTANEOUS | Status: DC
  Administered 2017-03-18 – 2017-03-23 (×4): 10 [IU] via SUBCUTANEOUS
  Filled 2017-03-17 (×6): qty 0.1

## 2017-03-17 MED ORDER — SORBITOL 70 % SOLN: 30.0000 mL | Freq: Every day | Status: DC | PRN

## 2017-03-17 MED ORDER — METOCLOPRAMIDE HCL 5 MG PO TABS: 5.0000 mg | ORAL_TABLET | Freq: Three times a day (TID) | ORAL | Status: DC

## 2017-03-17 MED ORDER — IPRATROPIUM-ALBUTEROL 0.5-2.5 (3) MG/3ML IN SOLN
3.0000 mL | Freq: Three times a day (TID) | RESPIRATORY_TRACT | Status: DC
  Administered 2017-03-17 – 2017-03-19 (×5): 3 mL via RESPIRATORY_TRACT
  Filled 2017-03-17 (×5): qty 3

## 2017-03-17 MED ORDER — ONDANSETRON HCL 4 MG PO TABS
4.0000 mg | ORAL_TABLET | Freq: Four times a day (QID) | ORAL | Status: DC | PRN
  Administered 2017-03-21: 4 mg via ORAL
  Filled 2017-03-17 (×2): qty 1

## 2017-03-17 MED ORDER — INSULIN ASPART 100 UNIT/ML ~~LOC~~ SOLN: 0.0000 [IU] | Freq: Three times a day (TID) | SUBCUTANEOUS | 11 refills | Status: DC

## 2017-03-17 MED ORDER — ENSURE ENLIVE PO LIQD: 237.0000 mL | Freq: Two times a day (BID) | ORAL | 12 refills | Status: DC

## 2017-03-17 MED ORDER — HEPARIN SODIUM (PORCINE) 5000 UNIT/ML IJ SOLN
5000.0000 [IU] | Freq: Three times a day (TID) | INTRAMUSCULAR | Status: DC
  Administered 2017-03-17 – 2017-03-23 (×16): 5000 [IU] via SUBCUTANEOUS
  Filled 2017-03-17 (×16): qty 1

## 2017-03-17 MED ORDER — INSULIN ASPART 100 UNIT/ML ~~LOC~~ SOLN
0.0000 [IU] | Freq: Every day | SUBCUTANEOUS | Status: DC
  Administered 2017-03-18: 1 [IU] via SUBCUTANEOUS

## 2017-03-17 MED ORDER — BUDESONIDE 0.5 MG/2ML IN SUSP: 0.5000 mg | Freq: Two times a day (BID) | RESPIRATORY_TRACT | 12 refills | Status: DC

## 2017-03-17 MED ORDER — METOPROLOL TARTRATE 37.5 MG PO TABS: 37.5000 mg | ORAL_TABLET | Freq: Two times a day (BID) | ORAL | Status: DC

## 2017-03-17 MED ORDER — ACETAMINOPHEN 325 MG PO TABS: 650.0000 mg | ORAL_TABLET | Freq: Four times a day (QID) | ORAL | Status: DC | PRN

## 2017-03-17 MED ORDER — POTASSIUM CHLORIDE CRYS ER 20 MEQ PO TBCR
40.0000 meq | EXTENDED_RELEASE_TABLET | Freq: Once | ORAL | Status: AC
  Administered 2017-03-17: 40 meq via ORAL
  Filled 2017-03-17: qty 2

## 2017-03-17 MED ORDER — METOCLOPRAMIDE HCL 5 MG PO TABS
5.0000 mg | ORAL_TABLET | Freq: Three times a day (TID) | ORAL | Status: DC
  Administered 2017-03-18 – 2017-03-19 (×4): 5 mg via ORAL
  Filled 2017-03-17 (×4): qty 1

## 2017-03-17 MED ORDER — BUDESONIDE 0.5 MG/2ML IN SUSP
0.5000 mg | Freq: Two times a day (BID) | RESPIRATORY_TRACT | Status: DC
  Administered 2017-03-17 – 2017-03-22 (×11): 0.5 mg via RESPIRATORY_TRACT
  Filled 2017-03-17 (×12): qty 2

## 2017-03-17 MED ORDER — HEPARIN SODIUM (PORCINE) 5000 UNIT/ML IJ SOLN: 5000.0000 [IU] | Freq: Three times a day (TID) | INTRAMUSCULAR | Status: DC

## 2017-03-17 MED ORDER — ONDANSETRON HCL 4 MG/2ML IJ SOLN: 4.0000 mg | Freq: Four times a day (QID) | INTRAMUSCULAR | Status: DC | PRN

## 2017-03-17 MED ORDER — GLYCOPYRROLATE 0.2 MG/ML IJ SOLN
0.2000 mg | INTRAMUSCULAR | Status: DC | PRN
  Filled 2017-03-17: qty 1

## 2017-03-17 MED ORDER — FAMOTIDINE 20 MG PO TABS
20.0000 mg | ORAL_TABLET | Freq: Two times a day (BID) | ORAL | Status: DC | PRN
  Administered 2017-03-19 – 2017-03-22 (×3): 20 mg via ORAL
  Filled 2017-03-17 (×4): qty 1

## 2017-03-17 MED ORDER — PANTOPRAZOLE SODIUM 40 MG PO TBEC: 40.0000 mg | DELAYED_RELEASE_TABLET | Freq: Every day | ORAL | Status: DC

## 2017-03-17 MED ORDER — METOPROLOL TARTRATE 25 MG PO TABS
37.5000 mg | ORAL_TABLET | Freq: Two times a day (BID) | ORAL | Status: DC
  Administered 2017-03-17 – 2017-03-23 (×12): 37.5 mg via ORAL
  Filled 2017-03-17 (×12): qty 1

## 2017-03-17 MED ORDER — PANTOPRAZOLE SODIUM 40 MG PO TBEC
40.0000 mg | DELAYED_RELEASE_TABLET | Freq: Every day | ORAL | Status: DC
  Administered 2017-03-18 – 2017-03-23 (×6): 40 mg via ORAL
  Filled 2017-03-17 (×6): qty 1

## 2017-03-17 MED ORDER — IPRATROPIUM-ALBUTEROL 0.5-2.5 (3) MG/3ML IN SOLN
3.0000 mL | Freq: Three times a day (TID) | RESPIRATORY_TRACT | Status: DC
  Administered 2017-03-17 (×2): 3 mL via RESPIRATORY_TRACT
  Filled 2017-03-17 (×2): qty 3

## 2017-03-17 MED ORDER — ENSURE ENLIVE PO LIQD
237.0000 mL | Freq: Two times a day (BID) | ORAL | Status: DC
  Administered 2017-03-18 – 2017-03-20 (×2): 237 mL via ORAL

## 2017-03-17 MED ORDER — INSULIN GLARGINE 100 UNIT/ML ~~LOC~~ SOLN
10.0000 [IU] | Freq: Every day | SUBCUTANEOUS | 11 refills | Status: DC
Start: 2017-03-18 — End: 2017-03-23

## 2017-03-17 MED ORDER — METOCLOPRAMIDE HCL 5 MG PO TABS
5.0000 mg | ORAL_TABLET | Freq: Three times a day (TID) | ORAL | Status: DC
  Administered 2017-03-17 (×2): 5 mg via ORAL
  Filled 2017-03-17 (×2): qty 1

## 2017-03-17 MED ORDER — IPRATROPIUM-ALBUTEROL 0.5-2.5 (3) MG/3ML IN SOLN: 3.0000 mL | Freq: Three times a day (TID) | RESPIRATORY_TRACT | Status: DC

## 2017-03-17 NOTE — Progress Notes (Signed)
Physical Therapy Treatment Patient Details Name: Luis [name] MRN: [id number] DOB: [birth year] Today's Date: 03/17/2017    History of Present Illness 51 yo admitted with flu A and PNA, heart failure, VDRF 4/30-5/10. PMHx:HTN, obesity, Afib, CHF, NICM, HLD, DM    PT Comments    Per notes, pt refusing CIR at time of PT session.  Focused session on problem solving with pt and mom on home scenarios and how they would manage.  Pt has 3 sets of 2 steps to enter.  Discussed how they would manage and pt felt he could step up the 2 steps with rail.  He did verbalize that he didn't think his mom could get w/c up the steps, but she stated they could.  Pt also had a BM in the bed and was unaware.  Strongly recommend CIR and encouraged pt to be open to this.   Follow Up Recommendations  CIR;Supervision/Assistance - 24 hour     Equipment Recommendations  Rolling walker with 5" wheels;3in1 (PT)    Recommendations for Other Services       Precautions / Restrictions Precautions Precautions: Fall Restrictions Weight Bearing Restrictions: No    Mobility  Bed Mobility Overal bed mobility: Needs Assistance Bed Mobility: Supine to Sit     Supine to sit: Min guard     General bed mobility comments: bed flat, but heavy use of rail  Transfers Overall transfer level: Needs assistance Equipment used: Rolling walker (2 wheeled) Transfers: Sit to/from Stand Sit to Stand: Min assist;From elevated surface         General transfer comment: Pt stood with MIN A with verbal cues for hand placement. Pt with BM on bed pad and unaware he had BM.  SPT with MIN A and cueing for steps.  Ambulation/Gait             General Gait Details: deferred due to needing to be cleaned   Stairs            Wheelchair Mobility    Modified Rankin (Stroke Patients Only)       Balance     Sitting balance-Leahy Scale: Good     Standing balance support: Bilateral upper extremity  supported Standing balance-Leahy Scale: Poor Standing balance comment: requires UE support                            Cognition Arousal/Alertness: Awake/alert Behavior During Therapy: Flat affect Overall Cognitive Status: Within Functional Limits for tasks assessed Area of Impairment: Safety/judgement;Problem solving                         Safety/Judgement: Decreased awareness of safety;Decreased awareness of deficits   Problem Solving: Slow processing        Exercises      General Comments General comments (skin integrity, edema, etc.): Reviewed sitting therex he can do while in chair. Deferred due to needing to be cleaned.      Pertinent Vitals/Pain Pain Assessment: No/denies pain    Home Living                      Prior Function            PT Goals (current goals can now be found in the care plan section) Acute Rehab PT Goals PT Goal Formulation: With patient Time For Goal Achievement: 03/27/17 Potential to Achieve Goals: Fair Progress towards PT goals: Progressing  toward goals    Frequency    Min 3X/week      PT Plan Current plan remains appropriate    Co-evaluation              AM-PAC PT "6 Clicks" Daily Activity  Outcome Measure  Difficulty turning over in bed (including adjusting bedclothes, sheets and blankets)?: A Little Difficulty moving from lying on back to sitting on the side of the bed? : A Little Difficulty sitting down on and standing up from a chair with arms (e.g., wheelchair, bedside commode, etc,.)?: Total Help needed moving to and from a bed to chair (including a wheelchair)?: A Little Help needed walking in hospital room?: A Lot Help needed climbing 3-5 steps with a railing? : A Lot 6 Click Score: 14    End of Session Equipment Utilized During Treatment: Gait belt;Oxygen Activity Tolerance: Patient tolerated treatment well Patient left: in chair;with family/visitor present;Other (comment)  (alarm pad in chair, not set due to NT coming in to clean) Nurse Communication: Mobility status PT Visit Diagnosis: Muscle weakness (generalized) (M62.81);Difficulty in walking, not elsewhere classified (R26.2);Other abnormalities of gait and mobility (R26.89)     Time: 9407-6808 PT Time Calculation (min) (ACUTE ONLY): 28 min  Charges:  $Therapeutic Activity: 23-37 mins                    G Codes:       Veanna Dower L. Katrinka Blazing, [place] Pager [phone] 03/17/2017    Enzo Montgomery 03/17/2017, 12:15 PM

## 2017-03-17 NOTE — Progress Notes (Signed)
CM talked to patient with his mother at the bedside. His mother is very talkative stating that the hospital is trying to kill him by forcing tubes down his throat and putting restraints on him. Patient wants to leave AMA; CM encouraged patient to talk to his physician first; Message sent to Attending MD concerning patients desire to leave the hospital today; Alexis Goodell [phone]

## 2017-03-17 NOTE — Discharge Summary (Signed)
Physician Discharge Summary  Luis [name] WUJ:[id number] DOB: 12-26-1965 DOA: 03/01/2017  PCP: [name], MD  Admit date: 03/01/2017 Discharge date: 03/17/2017  Admitted From:  Home  Disposition:  CIR inpatient rehab   Recommendations for Outpatient Follow-up:  1. Follow up with PCP in 2 weeks to establish care 2. Follow up with pulmonology in 2 weeks to establish care and arrange outpatient sleep study 3. Continue oxygen nasal cannula and wean as tolerated 4. Continue nightly bipap therapy 5. Monitor blood glucose TIDAC 6. Please obtain BMP in 2-3 days 7. DC metoclopramide in 2-3 days 8. 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 Per mother the patient has had worsening DOE, BL LE edema and orthopnea for several weeks. He also was feeling ill with productive cough and myalgias after recent contact with sick children with similar symptoms. Mom went to get Nyquil for pt and EMS was present at home upon her return.   Upon arrival to the ED pt repeatedly stated "I cant breathe, I cant breathe" and he denied CP per EDP. He was saturating 84% on RA on presentation with agitation and increased WOB. Placed on CPAP with worsening agitation however improvement of O2 sats. He was intubated in ED and PCCM asked to admit. CMET and CBC wnl. BNP 244. Initial trop negative. CXR with ETT and diffuse patchy BL infiltrates + cardiomegaly concerning for pulmonary edema.   Brief Narrative:  51 y.o.male w/ HTN, Afib, combined NICM/CHF,  morbid obesity, HLD, and tobacco abuse who presented 4/29 with worseningDOE, BL LE edema and orthopnea for several weeks. He required intubation for acute respiratory failure 2nd to influenza A with pneumonia.  Significant Events: 04/29 - Admit / intubated in ED for respiratory failure 05/01 - Started on Solu-Medrol 05/03 - Solu-Medrol stopped  05/04 - Transitioned off pressor support (Levophed) & started on Prednisone from Solu-Medrol 05/05 - Precedex started & Fleet Enema given 05/06 - new fever & cultures obtained w/ broadening of antibiotics. 05/07 - Precedex stopped. Patient given 2 doses of Lactulose via tube. Reglan IV started. 05/08 - Tele Sitter ordered for patient safety due to staff concern that mother removed central venous catheter & lateral transfer from CCU to MICU 05/09 - Tube feeding stopped 5/12 [facility] assumed care  5/15  Pt accepted for inpatient rehabilitation  Assessment & Plan:  Acute hypoxic and hypercarbic respiratory failure Multifactorial - improving - cont to wean O2 as able, now down to 3L [place].     Acute on chronic Combined systolic and diastolic CHF w/ acute exacerbation / pulm edema TTE 03/03/2017 noted EF 50-55%, no WMA, and grade 1 DD - net negative ~3.8L since admit - follow wgts and Is/Os. Weight is down 11 lbs since admit.         Filed Weights   03/15/17 0330 03/16/17 0318 03/17/17 0527  Weight: (!) 157 kg (346 lb 2 oz) (!) 158.6 kg (349 lb 10.4 oz) (!) 153.5 kg (338 lb 6.4 oz)   OSA/OHS Noncompliant w/ home CPAP use - BiPAP ordered daily at  bedtime and as needed per PCCM - tolerating well thus far but had declined to use, encouraged him to use it regularly .  He needs to have an outpatient sleep study done.  He is recommended to follow up with primary care and pulmonology to have this arranged.  Pt verbalized understanding.    Sepsis - possible RLL PNA v/s atx Completed empiric antibiotic therapy - sepsis physiology resolved  Influenza A  pneumonia Has completed a course of Tamiflu. DC droplet precautions.   Acute renal injury  crt slowly improving - follow trend   Last Labs    Recent Labs Lab 03/12/17 0502 03/13/17 0202 03/14/17 0401 03/15/17 0311 03/16/17 0252  CREATININE 1.86* 1.47* 1.31*  1.28* 1.37* 1.34*     Colonic ileus (resolved now) Treated with Reglan -tolerating solid diet and having BMs.  Switched reglan to oral formulation from IV 5/15.  Likely can DC reglan (metoclopramide) in 2 days.     Acute encephalopathy (resolved now)  Minimize sedating meds - ambulate - appears mental status is much improved.    COPD with acute exacerbation Cont medical tx - with bronchodilators. He is not on any steroids at this time. No evidence of wheezing. Continue nightly bipap.  Needs outpt PFTs but does NOT need home nebs per PCCM - no active wheezing today   Paroxysmal atrial fibrillation No systemic anticoagulation given history of nonadherence - readdress outpatient He is back in Afib and had two 4-5 sec pauses, likely secondary to beta blockers.  Follow closely.  If persists, consult cardiology.  For now recommending outpatient cardiology follow up with [facility].    HTN BP recently better controlled - adjust medical tx and follow  DM2 CBG controlled  CBG (last 3)   Recent Labs  03/16/17 2112 03/17/17 0746 03/17/17 1208  GLUCAP 190* 145* 149*   Morbid Obesity - Body mass index is 45.9 kg/m.  DVT prophylaxis: SQ heparin Code Status: DO NOT INTUBATE Family Communication: mother at bedside and updated Disposition Plan: CIR  Consultants:  PCCM PT CIR  Antimicrobials:  Rocephin 4/29 - 5/4 Azithromycin 4/29 - 5/1 Tamiflu 4/30 - 5/4 Vancomycin 4/30 - 5/2 + 5/6 - 5/9 Zosyn 5/6 > 5/12  Discharge Diagnoses:  Active Problems:   Acute heart failure (HCC)   Acute on chronic systolic congestive heart failure (HCC)   Flash pulmonary edema (HCC)   Acute respiratory failure  (HCC)   Endotracheally intubated   Abdominal distention   VAP (ventilator-associated pneumonia) (HCC)   HAP (hospital-acquired pneumonia)   Bilateral pleural effusion   Influenza A   Acute renal failure (HCC)   NICM (nonischemic cardiomyopathy) (HCC)   Morbid obesity (HCC)   Tobacco abuse   Diabetes mellitus type 2 in obese (HCC)   Benign essential HTN   Noncompliance   Tachycardia   Tachypnea   Ileus [facility])  Discharge Instructions  Discharge Instructions    Increase activity slowly    Complete by:  As directed      Allergies as of 03/17/2017      Reactions   Demerol [meperidine] Itching   Morphine And Related Itching      Medication List    TAKE these medications   acetaminophen 325 MG tablet Commonly known as:  TYLENOL Take 2 tablets (650 mg total) by mouth every 6 (six) hours as needed for mild pain, fever or headache.   budesonide 0.5 MG/2ML nebulizer solution Commonly known as:  PULMICORT Take 2 mLs (0.5 mg total)  by nebulization 2 (two) times daily.   feeding supplement (ENSURE ENLIVE) Liqd Take 237 mLs by mouth 2 (two) times daily between meals.   insulin aspart 100 UNIT/ML injection Commonly known as:  novoLOG Inject 0-9 Units into the skin 3 (three) times daily with meals.   insulin glargine 100 UNIT/ML injection Commonly known as:  LANTUS Inject 0.1 mLs (10 Units total) into the skin daily. Start taking on:  03/18/2017   ipratropium-albuterol 0.5-2.5 (3) MG/3ML Soln Commonly known as:  DUONEB Take 3 mLs by nebulization 3 (three) times daily.   metoCLOPramide 5 MG tablet Commonly known as:  REGLAN Take 1 tablet (5 mg total) by mouth 3 (three) times daily before meals.   Metoprolol Tartrate 37.5 MG Tabs Take 37.5 mg by mouth 2 (two) times daily.   pantoprazole 40 MG tablet Commonly known as:  PROTONIX Take 1 tablet (40 mg total) by mouth daily.      Follow-up Information    [facility]. Schedule an appointment  as soon as possible for a visit in 2 week(s).   Why:  establish primary care  Contact information: [address] [place] [address] [phone]       [facility]. Schedule an appointment as soon as possible for a visit in 2 week(s).   Specialty:  Pulmonology Why:  establish care and schedule sleep study Contact information: [address] [place] [address] [phone]       [facility]. Schedule an appointment as soon as possible for a visit in 3 week(s).   Why:  establish care for atrial fibrillation and CHF Contact information: [address] [place] [address] [phone]         Allergies  Allergen Reactions  . Demerol [Meperidine] Itching  . Morphine And Related Itching    Procedures/Studies: Dg Chest Port 1 View  Result Date: 03/15/2017 CLINICAL DATA:  Shortness of breath. Atrial fibrillation. Hypertension. Diabetes. Obesity. EXAM: PORTABLE CHEST 1 VIEW COMPARISON:  03/12/2017 FINDINGS: Shallow inspiration. Cardiac enlargement. Pulmonary vascularity is normal. No consolidation or airspace disease in the lungs. No blunting of costophrenic angles. No pneumothorax. Mediastinal contours appear intact. IMPRESSION: Cardiac enlargement.  No edema or consolidation. Electronically Signed   By: Burman Nieves M.D.   On: 03/15/2017 06:23   Dg Chest Port 1 View  Result Date: 03/12/2017 CLINICAL DATA:  Acute respiratory failure with hypoxia. EXAM: PORTABLE CHEST 1 VIEW COMPARISON:  03/11/2017 FINDINGS: Endotracheal tube terminates at the level of the clavicles, unchanged. Enteric tube courses into the left upper abdomen with tip not imaged. The cardiac silhouette remains enlarged. Pulmonary vascular congestion is unchanged with similar appearance of mild diffuse interstitial accentuation. Bibasilar opacities have not significantly changed.  IMPRESSION: Unchanged pulmonary vascular congestion/ mild edema. Unchanged bibasilar opacities which may reflect atelectasis. Electronically Signed   By: Sebastian Ache M.D.   On: 03/12/2017 07:29   Dg Chest Port 1 View  Result Date: 03/11/2017 CLINICAL DATA:  51 year old male with shortness of breath and intubated. Acute on chronic systolic congestive heart failure, pulmonary edema, obesity. EXAM: PORTABLE CHEST 1 VIEW COMPARISON:  03/09/2017 and earlier. FINDINGS: Portable AP semi upright view at 0455 hours. Left IJ central line removed. Stable endotracheal tube. Stable visible enteric tube. The patient is slightly rotated to the right. Patchy and confluent right lung base opacity has progressed since 03/07/2017. At the same time, since yesterday pulmonary vascularity appears more  distinct. Generalized interstitial opacity appears decreased. Left lung base ventilation has improved since yesterday. No areas of worsening ventilation. No pneumothorax or definite pleural effusion. IMPRESSION: 1. Left IJ central line removed.  Otherwise stable lines and tubes. 2. Improved ventilation since yesterday probably due to regressed pulmonary edema. 3. Increased right lung base opacity since 03/07/2017 which could reflect atelectasis or infection. Electronically Signed   By: Odessa Fleming M.D.   On: 03/11/2017 07:17   Dg Chest Port 1 View  Result Date: 03/09/2017 CLINICAL DATA:  Endotracheal tube, acute respiratory failure. EXAM: PORTABLE CHEST 1 VIEW COMPARISON:  03/08/2017. FINDINGS: Endotracheal tube terminates approximately 5 cm above the carina. Nasogastric tube is followed into the stomach with the tip projecting beyond the inferior margin of the image. Left IJ central line tip projects over the SVC. Heart is enlarged. Lungs are low in volume with diffuse mixed interstitial and airspace opacification, increased. No definite pleural fluid. IMPRESSION: Increasing mixed interstitial and airspace opacification, indicative of  edema. Electronically Signed   By: Leanna Battles M.D.   On: 03/09/2017 07:54   Dg Chest Port 1 View  Result Date: 03/08/2017 CLINICAL DATA:  Acute respiratory failure EXAM: PORTABLE CHEST 1 VIEW COMPARISON:  03/07/2017 FINDINGS: ET tube tip is above the carina. There is a left IJ catheter with tip over the projection of the distal SVC. NG tube is in place with tip below the field of view. Normal heart size. Atelectasis noted in the right base. Pulmonary vascular congestion unchanged. IMPRESSION: 1. No change in aeration to the  lungs compared with previous exam. Electronically Signed   By: Signa Kell M.D.   On: 03/08/2017 07:24   Dg Chest Port 1 View  Result Date: 03/07/2017 CLINICAL DATA:  51 year old male presented with shortness of breath and intubated. Acute on chronic systolic congestive heart failure, pulmonary edema, obesity.  Intubated. EXAM: PORTABLE CHEST 1 VIEW COMPARISON:  03/06/2017 and earlier. FINDINGS: Portable AP semi upright view at 0500 hours. The patient is less rotated. Stable endotracheal tube tip at the level the clavicles. Enteric tube courses to the abdomen, tip not included. Stable left IJ central line. Stable cardiac size and mediastinal contours. Streaky opacity about the right hilum, but has regressed since 03/01/2017. Stable pulmonary vascularity without edema. No pneumothorax. No pleural effusion. IMPRESSION: 1.  Stable lines and tubes. 2. Mild lung base atelectasis.  No new cardiopulmonary abnormality. Electronically Signed   By: Odessa Fleming M.D.   On: 03/07/2017 07:42   Dg Chest Port 1 View  Result Date: 03/06/2017 CLINICAL DATA:  51 year old male presented with shortness of breath and intubated. Acute on chronic systolic congestive heart failure, pulmonary edema, obesity. EXAM: PORTABLE CHEST 1 VIEW COMPARISON:  03/05/2017 and earlier. FINDINGS: Portable AP semi upright view at at 0630 hours. Endotracheal tube tip is stable just above the clavicles. Enteric tube courses  to the abdomen, tip not included. Stable left IJ central line. The patient remains mildly rotated to the right. Stable cardiac size and mediastinal contours. Stable somewhat low lung volumes, but allowing for portable technique the lungs are clear. No pneumothorax or pleural effusion. IMPRESSION: 1.  Stable lines and tubes. 2.  No acute cardiopulmonary abnormality. Electronically Signed   By: Odessa Fleming M.D.   On: 03/06/2017 07:57   Dg Chest Port 1 View  Result Date: 03/05/2017 CLINICAL DATA:  Evaluate ETT EXAM: PORTABLE CHEST 1 VIEW COMPARISON:  Mar 04, 2017 FINDINGS: The ETT terminates in the mid trachea. The NG  tube terminates below today's film. A left-sided central line is stable. No pneumothorax. Pulmonary edema is similar in the interval. There is a little more focal opacity in the lower right base. Stable cardiomegaly. No other changes. IMPRESSION: 1. Support apparatus is in good position within visualize limits. 2. Persistent mild edema. 3. More focal opacity in the right base could represent asymmetric edema or developing infiltrate. Recommend attention on follow-up. Electronically Signed   By: Gerome Sam III M.D   On: 03/05/2017 07:45   Dg Chest Port 1 View  Result Date: 03/04/2017 CLINICAL DATA:  Intubated patient, bilateral pleural effusions, acute on chronic CHF, acute respiratory failure with hypoxia. EXAM: PORTABLE CHEST 1 VIEW COMPARISON:  Portable chest x-ray of Mar 03, 2017 FINDINGS: The lungs are adequately inflated. The interstitial markings remain increased. Patchy density is present at both lung bases which is stable. There is no definite pleural effusion. There is biapical pleural thickening. The cardiac silhouette is mildly enlarged and the pulmonary vascularity remains engorged. The endotracheal tube tip lies approximately 6.8 cm above the carina with the tube tip light above the clavicular heads. The esophagogastric tube tip projects below the inferior margin of the image. The left  internal jugular venous catheter tip projects over the proximal SVC. IMPRESSION: Stable appearance of the chest since yesterday's study allowing for differences in positioning. Persistent CHF with mild interstitial edema. Persistent bibasilar atelectasis or pneumonia. Persistent relatively high positioning of the endotracheal tube as described on yesterday's study as well. Correlation as to the adequacy of this positioning for the type of tube and cuff present is needed. Electronically Signed   By: David  [place] M.D.   On: 03/04/2017 07:38   Dg Chest Port 1 View  Result Date: 03/03/2017 CLINICAL DATA:  Intubated patient, acute respiratory failure, acute on chronic CHF, former smoker. EXAM: PORTABLE CHEST 1 VIEW COMPARISON:  Portable chest x-ray of March 02, 2017 FINDINGS: The lungs are adequately inflated. The interstitial markings remain increased. Coarse lung markings in the lower lung zones persist bilaterally. The cardiac silhouette is enlarged. The central pulmonary vascularity is prominent. The endotracheal tube tip lies approximately 7.3 cm above the carina. The tip is at the level of the clavicular heads. The esophagogastric tube tip and proximal port project below the GE junction. The left internal jugular venous catheter tip projects over the proximal SVC. IMPRESSION: Persistent bibasilar atelectasis or pneumonia. Stable cardiomegaly without significant pulmonary vascular congestion. Relatively high positioning of the endotracheal tube whose tip is at the level of the clavicular heads an approximately 7.2 cm above the carina. Correlation as to the adequacy of this positioning with the type of ET tube is needed. Electronically Signed   By: David  [place] M.D.   On: 03/03/2017 08:02   Dg Chest Port 1 View  Result Date: 03/02/2017 CLINICAL DATA:  Central line placement EXAM: PORTABLE CHEST 1 VIEW COMPARISON:  03/02/2017 FINDINGS: The feeding tube appears to cul well once in the cervical esophagus or  hypopharynx, and thenextend down to enter the stomach. Endotracheal tube tip 7.6 cm above the carina, about at the thoracic inlet. The left internal jugular central line terminates in the upper SVC. No visible pneumothorax. Stable appearance of cardiomegaly with indistinct pulmonary vasculature. Mild central interstitial accentuation. No blunting of the costophrenic angles is observed. Thoracic spondylosis. IMPRESSION: 1. New left IJ central venous catheter tip: SVC. No visible pneumothorax. 2. The nasogastric tube is thought to coil once in the cervical esophagus or hypopharynx, but terminates  in the stomach. 3. Stable cardiomegaly with pulmonary venous hypertension and borderline interstitial edema. 4. Endotracheal tube tip is essentially at the thoracic inlet. These results will be called to the ordering clinician or representative by the Radiologist Assistant, and communication documented in the PACS or zVision Dashboard. Electronically Signed   By: Gaylyn Rong M.D.   On: 03/02/2017 13:56   Dg Chest Port 1 View  Result Date: 03/02/2017 CLINICAL DATA:  Shortness of breath, OG tube placement EXAM: PORTABLE CHEST 1 VIEW COMPARISON:  03/01/2017 FINDINGS: Suspected mild interstitial edema. Patchy bilateral lower lobe opacities, likely atelectasis. No definite pleural effusion or pneumothorax. Cardiomegaly. Endotracheal tube terminates at the thoracic inlet. Enteric tube terminates in the gastric cardia. IMPRESSION: Endotracheal tube terminates at the thoracic inlet. Suspected mild interstitial edema. Mild patchy bilateral lower lobe opacities, likely atelectasis. Enteric tube terminates in the gastric cardia. Electronically Signed   By: Charline Bills M.D.   On: 03/02/2017 12:12   Dg Chest Portable 1 View  Result Date: 03/01/2017 CLINICAL DATA:  Intubation. EXAM: PORTABLE CHEST 1 VIEW COMPARISON:  None. FINDINGS: An ET tube has been placed terminating in the mid trachea. There appears to be an OG  tube with the side port in the pharynx. No pneumothorax. Cardiomegaly. Bilateral interstitial opacities. No focal infiltrates, nodules, or masses. IMPRESSION: 1. The ETT is in good position. The NG tube terminates with the side port in the pharynx. Recommend repositioning. 2. Edema. Electronically Signed   By: Gerome Sam III M.D   On: 03/01/2017 19:32   Dg Abd Portable 1v  Result Date: 03/10/2017 CLINICAL DATA:  Feeding tube placement. EXAM: PORTABLE ABDOMEN - 1 VIEW COMPARISON:  Yesterday. FINDINGS: Nasogastric tube extending into the stomach with its tip in the region of the gastric pylorus or proximal duodenum and side hole in the distal stomach. Dilated right colon without significant change since 03/08/2017. The maximum cecal diameter is 12.6 cm. IMPRESSION: 1. Nasogastric tube tip in the gastric pylorus or proximal duodenum and side hole in the distal stomach. 2. Stable right colon ileus with a maximum cecal diameter of 12.6 cm. Electronically Signed   By: Beckie Salts M.D.   On: 03/10/2017 17:10   Dg Abd Portable 1v  Result Date: 03/09/2017 CLINICAL DATA:  Feeding tube advancement EXAM: PORTABLE ABDOMEN - 1 VIEW COMPARISON:  03/09/2017 FINDINGS: NG tube in within the proximal stomach. Limited portable exam including only a portion of the abdomen and chest. Heart is enlarged. These gas distention of the bowel in the left abdomen, suspect ileus. Degenerative changes noted spine. IMPRESSION: NG tube within the proximal stomach, slightly more advanced than the prior study. Suspect diffuse ileus Cardiomegaly Electronically Signed   By: Judie Petit.  Shick M.D.   On: 03/09/2017 15:20   Dg Abd Portable 1v  Result Date: 03/09/2017 CLINICAL DATA:  NG tube placement EXAM: PORTABLE ABDOMEN - 1 VIEW COMPARISON:  03/08/2017 FINDINGS: NG tube tip projects over the proximal stomach with the side-port in the distal esophagus. Continued diffuse gaseous distention of bowel, likely ileus. IMPRESSION: NG tube tip in the  proximal stomach with the side port in the distal esophagus. Continued ileus pattern. Electronically Signed   By: Charlett Nose M.D.   On: 03/09/2017 14:07   Dg Abd Portable 1v  Result Date: 03/08/2017 CLINICAL DATA:  Acute onset of generalized abdominal distention. Initial encounter. EXAM: PORTABLE ABDOMEN - 1 VIEW COMPARISON:  None. FINDINGS: There is diffuse distention of small and large bowel loops, concerning for ileus.  The enteric tube is noted overlying the body of the stomach. No free intra-abdominal air is seen, though evaluation for free air is limited on supine images. No acute osseous abnormalities are identified. Mild degenerative change is noted along the lower thoracic spine. IMPRESSION: Diffuse distention of small and large bowel loops likely reflects ileus. Enteric tube noted overlying the body of the stomach. No free intra-abdominal air seen. Electronically Signed   By: Roanna Raider M.D.   On: 03/08/2017 23:46   (Echo, Carotid, EGD, Colonoscopy, ERCP)    Subjective: Pt awake, alert, NAD, cooperative, agrees to go to rehab today.    Discharge Exam: Vitals:   03/17/17 0014 03/17/17 0527  BP: 118/81 (!) 161/99  Pulse: 83 81  Resp: 18 18  Temp: 98.3 F (36.8 C) 97.8 F (36.6 C)   Vitals:   03/17/17 0527 03/17/17 0924 03/17/17 0926 03/17/17 1012  BP: (!) 161/99     Pulse: 81     Resp: 18     Temp: 97.8 F (36.6 C)     TempSrc: Oral     SpO2: 97% 97% 97% 100%  Weight: (!) 153.5 kg (338 lb 6.4 oz)     Height:       General: morbidly obese male, awake, alert, oriented.  Cooperative.  No acute respiratory distress Lungs: clear bilaterally no wheezes or crackles heard.  Cardiovascular: RRR without murmur - distant BS  Abdomen: Nontender, morbidly obese, soft, bowel sounds positive, no rebound, no ascites, no appreciable mass Extremities: no significant edema BLE    The results of significant diagnostics from this hospitalization (including imaging, microbiology,  ancillary and laboratory) are listed below for reference.     Microbiology: Recent Results (from the past 240 hour(s))  Culture, blood (Routine X 2) w Reflex to ID Panel     Status: None   Collection Time: 03/08/17 11:00 PM  Result Value Ref Range Status   Specimen Description BLOOD RIGHT ARM  Final   Special Requests   Final    BOTTLES DRAWN AEROBIC AND ANAEROBIC Blood Culture adequate volume   Culture NO GROWTH 5 DAYS  Final   Report Status 03/13/2017 FINAL  Final  Culture, blood (Routine X 2) w Reflex to ID Panel     Status: None   Collection Time: 03/08/17 11:08 PM  Result Value Ref Range Status   Specimen Description BLOOD LEFT ANTECUBITAL  Final   Special Requests   Final    BOTTLES DRAWN AEROBIC ONLY Blood Culture adequate volume   Culture NO GROWTH 5 DAYS  Final   Report Status 03/13/2017 FINAL  Final  Culture, respiratory (NON-Expectorated)     Status: None   Collection Time: 03/09/17 10:09 PM  Result Value Ref Range Status   Specimen Description TRACHEAL ASPIRATE  Final   Special Requests Normal  Final   Gram Stain   Final    ABUNDANT WBC PRESENT,BOTH PMN AND MONONUCLEAR NO ORGANISMS SEEN    Culture Consistent with normal respiratory flora.  Final   Report Status 03/12/2017 FINAL  Final     Labs: BNP (last 3 results)  Recent Labs  03/01/17 1840 03/15/17 0701  BNP 243.5* 261.0*   Basic Metabolic Panel:  Recent Labs Lab 03/12/17 0502 03/13/17 0202 03/14/17 0401 03/15/17 0311 03/16/17 0252 03/16/17 0732 03/17/17 1055  NA 149* 149* 151*  149* 147* 144  --  139  K 3.8 3.2* 3.6  3.5 3.3* 2.9*  --  3.3*  CL 110 113* 114*  113* 113* 108  --  109  CO2 28 28 26  26 27 25   --  21*  GLUCOSE 180* 134* 139*  139* 123* 147*  --  180*  BUN 43* 32* 25*  25* 23* 24*  --  18  CREATININE 1.86* 1.47* 1.31*  1.28* 1.37* 1.34*  --  1.28*  CALCIUM 8.3* 8.2* 8.4*  8.4* 8.2* 8.1*  --  8.3*  MG 2.3 2.2 2.4  --   --  2.2 2.0  PHOS 4.1 2.4* 3.3  --   --   --  2.2*    Liver Function Tests:  Recent Labs Lab 03/12/17 0502 03/13/17 0202 03/14/17 0401 03/15/17 0311  AST  --   --   --  37  ALT  --   --   --  55  ALKPHOS  --   --   --  67  BILITOT  --   --   --  0.8  PROT  --   --   --  6.9  ALBUMIN 2.5* 2.6* 2.6* 2.7*   No results for input(s): LIPASE, AMYLASE in the last 168 hours. No results for input(s): AMMONIA in the last 168 hours. CBC:  Recent Labs Lab 03/12/17 0502 03/13/17 0202 03/14/17 0401 03/15/17 0311 03/16/17 0252  WBC 12.8* 9.3 9.1 10.3 9.7  NEUTROABS 9.4* 5.6 5.7  --   --   HGB 12.1* 12.2* 13.0 12.5* 12.6*  HCT 39.9 39.7 42.4 40.3 39.8  MCV 98.8 99.7 99.3 99.0 96.6  PLT 203 197 198 192 202   Cardiac Enzymes: No results for input(s): CKTOTAL, CKMB, CKMBINDEX, TROPONINI in the last 168 hours. BNP: Invalid input(s): POCBNP CBG:  Recent Labs Lab 03/16/17 1144 03/16/17 1643 03/16/17 2112 03/17/17 0746 03/17/17 1208  GLUCAP 199* 119* 190* 145* 149*   D-Dimer No results for input(s): DDIMER in the last 72 hours. Hgb A1c No results for input(s): HGBA1C in the last 72 hours. Lipid Profile No results for input(s): CHOL, HDL, LDLCALC, TRIG, CHOLHDL, LDLDIRECT in the last 72 hours. Thyroid function studies No results for input(s): TSH, T4TOTAL, T3FREE, THYROIDAB in the last 72 hours.  Invalid input(s): FREET3 Anemia work up No results for input(s): VITAMINB12, FOLATE, FERRITIN, TIBC, IRON, RETICCTPCT in the last 72 hours. Urinalysis    Component Value Date/Time   COLORURINE YELLOW 03/08/2017 2300   APPEARANCEUR CLEAR 03/08/2017 2300   LABSPEC 1.020 03/08/2017 2300   PHURINE 5.0 03/08/2017 2300   GLUCOSEU NEGATIVE 03/08/2017 2300   HGBUR MODERATE (A) 03/08/2017 2300   BILIRUBINUR NEGATIVE 03/08/2017 2300   KETONESUR NEGATIVE 03/08/2017 2300   PROTEINUR 30 (A) 03/08/2017 2300   NITRITE NEGATIVE 03/08/2017 2300   LEUKOCYTESUR NEGATIVE 03/08/2017 2300   Sepsis Labs Invalid input(s): PROCALCITONIN,  WBC,   LACTICIDVEN Microbiology Recent Results (from the past 240 hour(s))  Culture, blood (Routine X 2) w Reflex to ID Panel     Status: None   Collection Time: 03/08/17 11:00 PM  Result Value Ref Range Status   Specimen Description BLOOD RIGHT ARM  Final   Special Requests   Final    BOTTLES DRAWN AEROBIC AND ANAEROBIC Blood Culture adequate volume   Culture NO GROWTH 5 DAYS  Final   Report Status 03/13/2017 FINAL  Final  Culture, blood (Routine X 2) w Reflex to ID Panel     Status: None   Collection Time: 03/08/17 11:08 PM  Result Value Ref Range Status   Specimen Description BLOOD LEFT ANTECUBITAL  Final  Special Requests   Final    BOTTLES DRAWN AEROBIC ONLY Blood Culture adequate volume   Culture NO GROWTH 5 DAYS  Final   Report Status 03/13/2017 FINAL  Final  Culture, respiratory (NON-Expectorated)     Status: None   Collection Time: 03/09/17 10:09 PM  Result Value Ref Range Status   Specimen Description TRACHEAL ASPIRATE  Final   Special Requests Normal  Final   Gram Stain   Final    ABUNDANT WBC PRESENT,BOTH PMN AND MONONUCLEAR NO ORGANISMS SEEN    Culture Consistent with normal respiratory flora.  Final   Report Status 03/12/2017 FINAL  Final   Time coordinating discharge: 34 minutes  SIGNED:  Standley Dakins, MD  [facility] 03/17/2017, 1:13 PM Pager [phone]  If 7PM-7AM, please contact night-coverage [email or web address] Password [email or web address]

## 2017-03-17 NOTE — Progress Notes (Signed)
CM and Attending MD Dr Laural Benes at the patient's bedside to talk to the patient about DCP with his mother present. His mother is very angry and aggressive towards [place]. CM informed the mother that we need to talk to the patient to hear what he wants [place] to do. He agreed to go to Inpatient Rehab. Britta Mccreedy with CIR made aware; awaiting on insurance authorization. Abelino Derrick RN,MHA,BSn [phone]

## 2017-03-17 NOTE — Clinical Social Work Note (Signed)
CSW acknowledges SNF consult. Patient discharging to CIR today.  CSW signing off.  Arber Wiemers, CSW [phone]  

## 2017-03-17 NOTE — Progress Notes (Signed)
Patient admitted at approximately 1830 alert and oriented x 4 . No complaint of pain. Mild temp B/P elevated. Oriented patient to room and call bell system. Patient verbalized understanding of rehab process. Continue with plan of care.  Cleotilde Neer

## 2017-03-17 NOTE — Progress Notes (Signed)
I was called to pt's room by his Mom to discuss his wish to d/c home. I met with pt and his Mom at bedside. I discussed again what we discussed yesterday by phone a possible admission to inpt rehab today. Pt is adamant that he wants to d/c home today. He requests a rolling walker, bedside commode and wheelchair for home DME. Also requesting therapy services with home health to be arranged. Wants to rehab at home. I again discussed the therapy and MD recommendations for inpt rehab admission, pt and his Mom continue to decline that offer. I have updated RN CM, Hassan Rowan and contacted Dr. Wynetta Emery by text of pt wish to d/c home. We will sign off. Please contact me if pt and his Mom wish to reconsider. [phone]

## 2017-03-17 NOTE — H&P (Signed)
Physical Medicine and Rehabilitation Admission H&P       Chief Complaint  Patient presents with  . 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 Noncompliant with medications over the past several months. Patient reported to be on no prescription medications at time of admission. Per chart review  patient independent prior to admission living alone. One level home with 6 steps to entry. Works as Barista.Family in the area plans to provide assistance on discharge.Presented 03/01/2017 with productive cough, myalgias as well as increased lower extremity edema. Oxygen saturation 84% on room air placed on CPAP. Chest x-ray reviewed, showing b/l infiltartes. Per report, diffuse patchy bilateral infiltrates as well as cardiomegaly concerning for pulmonary edema. Hyponatremia 132, BNP 243, troponin negative, urine drug screen negative. Patient required intubation and placed on broad-spectrum antibiotics. Maintain on IV diuresis. Echocardiogram with ejection fraction of 55% no wall motion abnormality grade 1 diastolic dysfunction.  Antibiotic therapy course has been completed for influenza A pneumonia. Remainedintubated through 03/12/2017.AKI 2.06 improved with gentle hydration and latest creatinine 1.28. Demonstrating some impaired memory and awareness deficits subcutaneous heparin for DVT prophylaxis. Diet has been advanced to a regular consistency. Physical and occupational therapy evaluations completed with recommendations of physical medicine rehabilitation consult.Patient was admitted for a comprehensive rehab program  Review of Systems  Constitutional: Positive for chills and malaise/fatigue. Negative for fever.  HENT: Negative for hearing loss.   Eyes: Negative for  blurred vision and double vision.  Respiratory: Positive for cough, shortness of breath and wheezing.   Cardiovascular: Positive for palpitations and leg swelling.  Gastrointestinal: Positive for constipation. Negative for nausea and vomiting.  Genitourinary: Negative for dysuria, flank pain and hematuria.  Musculoskeletal: Positive for myalgias.  Skin: Negative for rash.  Neurological: Negative for seizures.  All other systems reviewed and are negative.      Past Medical History:  Diagnosis Date  . Atrial fibrillation ([place])   . CHF (congestive heart failure) ([place])   . DM (diabetes mellitus) ([place])   . HTN (hypertension)   . Hyperlipidemia   . Obesity   . OSA (obstructive sleep apnea)    History reviewed. No pertinent surgical history. History reviewed. No pertinent family history. Social History:  reports that he quit smoking about 8 years ago. He has never used smokeless tobacco. He reports that he does not drink alcohol. His drug history is not on file. Allergies:      Allergies  Allergen Reactions  . Demerol [Meperidine] Itching  . Morphine And Related Itching         Medications Prior to Admission  Medication Sig Dispense Refill  . [DISCONTINUED] DM-APAP-CPM (VICKS NYQUIL COLD & FLU NIGHT PO) Take 15 mLs by mouth every 8 (eight) hours as needed (for symptoms).      Home: Home Living Family/patient expects to be discharged to:: Private residence Living Arrangements: Alone Available Help at Discharge: Family, Available PRN/intermittently Type of Home: Apartment Home Access: Stairs to enter [employer] of Steps: 6 Home Layout: One level Bathroom Shower/Tub: Chiropodist: Standard Home Equipment: None   Functional History: Prior Function Level of Independence: Independent Comments: works at a [employer] Status:  Mobility: [place] bed mobility: Needs Assistance Bed Mobility:  Supine to Sit Rolling: Mod assist  Supine to sit: Min guard, HOB elevated Sit to supine: Min assist General bed mobility comments: No physical assist required today.  Assist for safety only.  Patient able to maintain sitting balance at EOB. Transfers Overall transfer level: Needs assistance Equipment used: Rolling walker (2 wheeled) Transfers: Sit to/from Stand Sit to Stand: Mod assist, +2 physical assistance General transfer comment: From regular height surface.  Verbal cues for hand placement.  Assist to power up to stance.  Patient stood x2 for approx 2 minutes each.  Able to step in place in stance.  Fatigued quickly with increased RR.  Cues to slow breathing. Ambulation/Gait General Gait Details: Unable today  ADL: ADL Overall ADL's : Needs assistance/impaired Eating/Feeding: Supervision/ safety, Sitting Eating/Feeding Details (indicate cue type and reason): drank ginger ale, cues to slow pace Grooming: Oral care, Set up Grooming Details (indicate cue type and reason): sitting unsupported on side of bed Upper Body Bathing: Moderate assistance, Sitting Lower Body Bathing: Total assistance, Bed level Upper Body Dressing : Moderate assistance, Sitting Lower Body Dressing: Total assistance, Bed level General ADL Comments: Pt sat EOB x 20 minutes with supervision.  Cognition: Cognition Overall Cognitive Status: Within Functional Limits for tasks assessed Orientation Level: Oriented X4 Cognition Arousal/Alertness: Awake/alert Behavior During Therapy: WFL for tasks assessed/performed Overall Cognitive Status: Within Functional Limits for tasks assessed Area of Impairment: Memory, Safety/judgement Orientation Level: Disoriented to, Time Memory: Decreased short-term memory Safety/Judgement: Decreased awareness of deficits, Decreased awareness of safety  Physical Exam: Blood pressure (!) 161/99, pulse 81, temperature 97.8 F (36.6 C), temperature source Oral, resp. rate 18,  height 6' (1.829 m), weight (!) 153.5 kg (338 lb 6.4 oz), SpO2 97 %. Physical Exam  Constitutional: He is oriented to person, place, and time.  51 year old right-handed male morbidly obese appearing older than stated age  HENT:  Head: Normocephalic.  Eyes: EOM are normal. Pupils are equal, round, and reactive to light.  Neck: Normal range of motion. Neck supple. No tracheal deviation present. No thyromegaly present.  Cardiovascular: Normal rate and regular rhythm.   Cardiac rate control  Respiratory: No respiratory distress. He has no wheezes. He has no rales.  Fair inspiratory effort  GI: Soft. Bowel sounds are normal. He exhibits no distension. There is no tenderness. There is no rebound.  Musculoskeletal: He exhibits edema (1+ lower extremity).  Neurological: He is alert and oriented to person, place, and time. No cranial nerve deficit.  Patient is soft spoken. Follows basic commands. Reasonable insight.   4/5 deltoid, 4/5 bicep, 4/5 tricep, 4/5 wrist extension, 4/5 hand intrinsics.      3/5 hip flexor, 3/5 knee extension, 4/5 ankle dorsiflexion, 4/5 ankle plantarflexion. Senses pain in all 4's.  Psych: pleasant and appropriate Skin: warm/dry    Lab Results Last 48 Hours        Results for orders placed or performed during the hospital encounter of 03/01/17 (from the past 48 hour(s))  Glucose, capillary     Status: Abnormal   Collection Time: 03/15/17 11:42 AM  Result Value Ref Range   Glucose-Capillary 230 (H) 65 - 99 mg/dL  Glucose, capillary     Status: Abnormal   Collection Time: 03/15/17  3:59 PM  Result Value Ref Range   Glucose-Capillary 148 (H) 65 - 99 mg/dL  Glucose, capillary     Status: Abnormal   Collection Time: 03/15/17  9:31 PM  Result Value Ref Range   Glucose-Capillary 182 (H) 65 - 99 mg/dL  CBC  Status: Abnormal   Collection Time: 03/16/17  2:52 AM  Result Value Ref Range   WBC 9.7 4.0 - 10.5 K/uL   RBC 4.12 (L) 4.22 - 5.81 MIL/uL    Hemoglobin 12.6 (L) 13.0 - 17.0 g/dL   HCT 39.8 39.0 - 52.0 %   MCV 96.6 78.0 - 100.0 fL   MCH 30.6 26.0 - 34.0 pg   MCHC 31.7 30.0 - 36.0 g/dL   RDW 14.5 11.5 - 15.5 %   Platelets 202 150 - 400 K/uL  Basic metabolic panel     Status: Abnormal   Collection Time: 03/16/17  2:52 AM  Result Value Ref Range   Sodium 144 135 - 145 mmol/L   Potassium 2.9 (L) 3.5 - 5.1 mmol/L   Chloride 108 101 - 111 mmol/L   CO2 25 22 - 32 mmol/L   Glucose, Bld 147 (H) 65 - 99 mg/dL   BUN 24 (H) 6 - 20 mg/dL   Creatinine, Ser 1.34 (H) 0.61 - 1.24 mg/dL   Calcium 8.1 (L) 8.9 - 10.3 mg/dL   GFR calc non Af Amer >60 >60 mL/min   GFR calc Af Amer >60 >60 mL/min    Comment: (NOTE) The eGFR has been calculated using the CKD EPI equation. This calculation has not been validated in all clinical situations. eGFR's persistently <60 mL/min signify possible Chronic Kidney Disease.    Anion gap 11 5 - 15  Magnesium     Status: None   Collection Time: 03/16/17  7:32 AM  Result Value Ref Range   Magnesium 2.2 1.7 - 2.4 mg/dL  Glucose, capillary     Status: Abnormal   Collection Time: 03/16/17  8:17 AM  Result Value Ref Range   Glucose-Capillary 114 (H) 65 - 99 mg/dL  Glucose, capillary     Status: Abnormal   Collection Time: 03/16/17 11:44 AM  Result Value Ref Range   Glucose-Capillary 199 (H) 65 - 99 mg/dL  Glucose, capillary     Status: Abnormal   Collection Time: 03/16/17  4:43 PM  Result Value Ref Range   Glucose-Capillary 119 (H) 65 - 99 mg/dL  Glucose, capillary     Status: Abnormal   Collection Time: 03/16/17  9:12 PM  Result Value Ref Range   Glucose-Capillary 190 (H) 65 - 99 mg/dL   Comment 1 Notify RN    Comment 2 Document in Chart   Glucose, capillary     Status: Abnormal   Collection Time: 03/17/17  7:46 AM  Result Value Ref Range   Glucose-Capillary 145 (H) 65 - 99 mg/dL   Comment 1 Notify RN    Comment 2 Document in Chart      Imaging Results  (Last 48 hours)  No results found.       Medical Problem List and Plan: 1.  Decreased functional mobility with debilitation secondary to acute hypoxic respiratory failure/Acute encephalopathy             -admit to inpatient rehab 2.  DVT Prophylaxis/Anticoagulation: SQ heparin. Check vascular study 3. Pain Management: Tylenol as needed 4. Mood: Provide emotional support 5. Neuropsych: This patient is capable of making decisions on his own behalf. 6. Skin/Wound Care: Routine skin checks 7. Fluids/Electrolytes/Nutrition: Routine I&O with follow up chemestries 8. Morbid obesity. BMI 45.9 KG. Dietary follow-up 9. Diabetes mellitus with peripheral neuropathy. Hemoglobin A1c 8.2. Lantus insulin 10 units daily. Check blood sugars before meals and at bedtime 10.COPD. Continue nebulizer treatments check oxygen saturations every shift             -  continue on 2L oxygen via [facility] for now 11.Hypertension. Lopressor 37.5 mg twice a day. Monitor with increased mobility 12.PAF. Cardiac rate controlled. Continue beta blocker 13. Combined diastolic and systolic congestive heart failure. Monitor for any signs of fluid overload 14. Hypokalemia. Follow-up chemistries 15.AKI. Creatinine stabilized at 1.28. Follow-up chemistries 16. Medical noncompliance. Counseling   Post Admission Physician Evaluation: 1. Functional deficits secondary  to debility, encephalopathy. 2. Patient is admitted to receive collaborative, interdisciplinary care between the physiatrist, rehab nursing staff, and therapy team. 3. Patient's level of medical complexity and substantial therapy needs in context of that medical necessity cannot be provided at a lesser intensity of care such as a SNF. 4. Patient has experienced substantial functional loss from his/her baseline which was documented above under the "Functional History" and "Functional Status" headings.  Judging by the patient's diagnosis, physical exam, and functional  history, the patient has potential for functional progress which will result in measurable gains while on inpatient rehab.  These gains will be of substantial and practical use upon discharge  in facilitating mobility and self-care at the household level. 5. Physiatrist will provide 24 hour management of medical needs as well as oversight of the therapy plan/treatment and provide guidance as appropriate regarding the interaction of the two. 6. The Preadmission Screening has been reviewed and patient status is unchanged unless otherwise stated above. 7. 24 hour rehab nursing will assist with bladder management, bowel management, safety, skin/wound care, disease management, medication administration, pain management and patient education  and help integrate therapy concepts, techniques,education, etc. 8. PT will assess and treat for/with: Lower extremity strength, range of motion, stamina, balance, functional mobility, safety, adaptive techniques and equipment, NMR, activity tolerance, family ed.   Goals are: mod I to supervision. 9. OT will assess and treat for/with: ADL's, functional mobility, safety, upper extremity strength, adaptive techniques and equipment, NMR, family education, ego support, community reintegration.   Goals are: mod I to set up. Therapy may proceed with showering this patient. 10. SLP will assess and treat for/with: n/a.  Goals are: n/a. 11. Case Management and Social Worker will assess and treat for psychological issues and discharge planning. 12. Team conference will be held weekly to assess progress toward goals and to determine barriers to discharge. 13. Patient will receive at least 3 hours of therapy per day at least 5 days per week. 14. ELOS: 7-10 days       15. Prognosis:  excellent     Meredith Staggers, MD, [facility] 03/17/2017  Cathlyn Parsons., PA-C 03/17/2017

## 2017-03-17 NOTE — Progress Notes (Signed)
Marcello Fennel, MD Physician Signed Physical Medicine and Rehabilitation  Consult Note Date of Service: 03/16/2017 7:06 AM  Related encounter: ED to Hosp-Admission (Discharged) from 03/01/2017 in [facility] 3E CHF     Expand All Collapse All   [] Hide copied text [] Hover for attribution information      Physical Medicine and Rehabilitation Consult Reason for Consult: Debilitation/acute encephalopathy Referring Physician: [facility]   HPI: Luis [name] is a 51 y.o. 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 Noncompliant with medications over the past several months. Patient reported to be on no prescription medications at time of admission. Per chart review and patient, patient independent prior to admission living alone. One level home with 6 steps to entry. Works as Production manager. Family in the area question 24-hour assistance on discharge. Presented 03/01/2017 with productive cough, myalgias as well as increased lower extremity edema. Oxygen saturation 84% on room air placed on CPAP. Chest x-ray reviewed, showing b/l infiltartes. Per report, diffuse patchy bilateral infiltrates as well as cardiomegaly concerning for pulmonary edema. Hyponatremia 132, BNP 243, troponin negative, urine drug screen negative. Patient required intubation and placed on broad-spectrum antibiotics. Maintain on IV diuresis. Echocardiogram with ejection fraction of 55% no wall motion abnormality grade 1 diastolic dysfunction. Patient remains on droplet precautions. Antibiotic therapy course has been completed. Remained intubated through 03/12/2017. Demonstrating some impaired memory and awareness deficits subcutaneous heparin for DVT prophylaxis. Diet has been advanced to a regular consistency. Physical and occupational therapy evaluations completed  with recommendations of physical medicine rehabilitation consult.   Review of Systems  Constitutional: Positive for chills and malaise/fatigue. Negative for fever.  HENT: Negative for hearing loss.   Eyes: Negative for blurred vision and double vision.  Respiratory: Positive for cough, shortness of breath and wheezing.   Cardiovascular: Positive for palpitations. Negative for chest pain.  Gastrointestinal: Positive for constipation. Negative for nausea and vomiting.  Genitourinary: Negative for dysuria, flank pain and hematuria.  Musculoskeletal: Positive for joint pain and myalgias.  Skin: Negative for rash.  Neurological: Negative for seizures.  All other systems reviewed and are negative.      Past Medical History:  Diagnosis Date  . Atrial fibrillation (HCC)   . CHF (congestive heart failure) (HCC)   . DM (diabetes mellitus) (HCC)   . HTN (hypertension)   . Hyperlipidemia   . Obesity   . OSA (obstructive sleep apnea)    History reviewed. No pertinent surgical history. History reviewed. No pertinent family history. Social History:  reports that he quit smoking about 8 years ago. He has never used smokeless tobacco. He reports that he does not drink alcohol. His drug history is not on file. Allergies:      Allergies  Allergen Reactions  . Demerol [Meperidine] Itching  . Morphine And Related Itching         Medications Prior to Admission  Medication Sig Dispense Refill  . [DISCONTINUED] DM-APAP-CPM (VICKS NYQUIL COLD & FLU NIGHT PO) Take 15 mLs by mouth every 8 (eight) hours as needed (for symptoms).      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 Status:  Mobility: Bed Mobility Overal bed mobility: Needs Assistance Bed Mobility: Supine to Sit, Sit to Supine Rolling: Mod assist Supine to sit: Min assist Sit to supine: Min assist General bed mobility comments: cues for technique, assist to raise trunk and for LEs back into bed Transfers Overall transfer level: Needs assistance Transfers: Sit to/from Stand Sit to Stand: Mod assist, +2 physical assistance General transfer comment: did not attempt this visit  ADL: ADL Overall ADL's : Needs assistance/impaired Eating/Feeding: Supervision/ safety, Sitting Eating/Feeding Details (indicate cue type and reason): drank ginger ale, cues to slow pace Grooming: Oral care, Sitting, Minimal assistance Upper Body Bathing: Moderate assistance, Sitting Lower Body Bathing: Total assistance, Bed level Upper Body Dressing : Moderate assistance, Sitting Lower Body Dressing: Total assistance, Bed level General ADL Comments: Pt sat EOB x 20 minutes with supervision.  Cognition: Cognition Overall Cognitive Status: Impaired/Different from baseline Orientation Level: Oriented X4 Cognition Arousal/Alertness: Awake/alert Behavior During Therapy: WFL for tasks assessed/performed Overall Cognitive Status: Impaired/Different from baseline Area of Impairment: Memory, Safety/judgement Orientation Level: Disoriented to, Time Memory: Decreased short-term memory Safety/Judgement: Decreased awareness of deficits, Decreased awareness of safety  Blood pressure (!) 174/112, pulse 81, temperature 98.6 F (37 C), temperature source Oral, resp. rate (!) 24, height 6' (1.829 m), weight (!) 158.6 kg (349 lb 10.4 oz), SpO2 92 %. Physical Exam  Vitals reviewed. Constitutional: He is oriented to person, place, and time. He appears well-developed.   51 year old obese right-handed male appearing older than stated age  HENT:  Head: Normocephalic and atraumatic.  Eyes: Conjunctivae and EOM are normal.  Neck: Normal range of motion. Neck supple. No thyromegaly present.  Cardiovascular: Regular rhythm.   +Tachycardia  Respiratory:  Decreased breath sounds at the bases with scattered rails +Tachypnea +  GI: Soft. Bowel sounds are normal. He exhibits no distension. There is no tenderness.  Musculoskeletal: He exhibits no edema or tenderness.  Neurological: He is alert and oriented to person, place, and time.  Patient appears anxious and short of breath.  Follows commands Motor: 4-/5 throughout Dysphonia  Skin: Skin is warm and dry.  Psychiatric: He has a normal mood and affect. His behavior is normal.    Lab Results Last 24 Hours       Results for orders placed or performed during the hospital encounter of 03/01/17 (from the past 24 hour(s))  Glucose, capillary     Status: Abnormal   Collection Time: 03/15/17  8:38 AM  Result Value Ref Range   Glucose-Capillary 232 (H) 65 - 99 mg/dL  Glucose, capillary     Status: Abnormal   Collection Time: 03/15/17 11:42 AM  Result Value Ref Range   Glucose-Capillary 230 (H) 65 - 99 mg/dL  Glucose, capillary     Status: Abnormal   Collection Time: 03/15/17  3:59 PM  Result Value Ref Range   Glucose-Capillary 148 (H) 65 - 99 mg/dL  Glucose, capillary     Status: Abnormal   Collection Time: 03/15/17  9:31 PM  Result Value Ref Range   Glucose-Capillary 182 (H) 65 - 99 mg/dL  CBC     Status: Abnormal   Collection Time: 03/16/17  2:52 AM  Result Value Ref Range   WBC 9.7 4.0 - 10.5 K/uL   RBC 4.12 (L) 4.22 - 5.81 MIL/uL   Hemoglobin 12.6 (L) 13.0 - 17.0 g/dL   HCT 16.1 09.6 - 04.5 %  MCV 96.6 78.0 - 100.0 fL   MCH 30.6 26.0 - 34.0 pg   MCHC 31.7 30.0 - 36.0 g/dL   RDW 08.6 57.8 - 46.9 %   Platelets 202 150 - 400 K/uL  Basic metabolic panel     Status: Abnormal    Collection Time: 03/16/17  2:52 AM  Result Value Ref Range   Sodium 144 135 - 145 mmol/L   Potassium 2.9 (L) 3.5 - 5.1 mmol/L   Chloride 108 101 - 111 mmol/L   CO2 25 22 - 32 mmol/L   Glucose, Bld 147 (H) 65 - 99 mg/dL   BUN 24 (H) 6 - 20 mg/dL   Creatinine, Ser 6.29 (H) 0.61 - 1.24 mg/dL   Calcium 8.1 (L) 8.9 - 10.3 mg/dL   GFR calc non Af Amer >60 >60 mL/min   GFR calc Af Amer >60 >60 mL/min   Anion gap 11 5 - 15      Imaging Results (Last 48 hours)  Dg Chest Port 1 View  Result Date: 03/15/2017 CLINICAL DATA:  Shortness of breath. Atrial fibrillation. Hypertension. Diabetes. Obesity. EXAM: PORTABLE CHEST 1 VIEW COMPARISON:  03/12/2017 FINDINGS: Shallow inspiration. Cardiac enlargement. Pulmonary vascularity is normal. No consolidation or airspace disease in the lungs. No blunting of costophrenic angles. No pneumothorax. Mediastinal contours appear intact. IMPRESSION: Cardiac enlargement.  No edema or consolidation. Electronically Signed   By: Burman Nieves M.D.   On: 03/15/2017 06:23     Assessment/Plan: Diagnosis: Debilitation Labs and images independently reviewed.  Records reviewed and summated above.  1. Does the need for close, 24 hr/day medical supervision in concert with the patient's rehab needs make it unreasonable for this patient to be served in a less intensive setting? Yes 2. Co-Morbidities requiring supervision/potential complications: NICM (cont to monitor), atrial fibrillation (monitor HR with increased activity), combined CHF (Monitor in accordance with increased physical activity and avoid UE resistance excercises), morbid obesity (Body mass index is 47.42 kg/m., diet and exercise education, encourage weight loss to increase endurance and promote overall health), tobacco abuse (counsel), diabetes mellitus (Monitor in accordance with exercise and adjust meds as necessary), HTN (monitor and provide prns in accordance with increased physical  exertion and pain, wean IV meds as appropriate), noncompliant (counsel), Tachycardia (monitor in accordance with pain and increasing activity), tachypnea (monitor RR and O2 Sats with increased physical exertion), 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 Due to safety, disease management and patient education, does the patient require 24 hr/day rehab nursing? Yes 4. Does the patient require coordinated care of a physician, rehab nurse, PT (1-2 hrs/day, 5 days/week) and OT (1-2 hrs/day, 5 days/week) to address physical and functional deficits in the context of the above medical diagnosis(es)? Yes Addressing deficits in the following areas: balance, endurance, locomotion, strength, transferring, bathing, dressing, toileting and psychosocial support 5. Can the patient actively participate in an intensive therapy program of at least 3 hrs of therapy per day at least 5 days per week? Not at present 6. The potential for patient to make measurable gains while on inpatient rehab is excellent 7. Anticipated functional outcomes upon discharge from inpatient rehab are supervision  with PT, supervision with OT, n/a with SLP. 8. Estimated rehab length of stay to reach the above functional goals is: 14-18 days. 9. Anticipated D/C setting: Home 10. Anticipated post D/C treatments: HH therapy and Home excercise program 11. Overall Rehab/Functional Prognosis: good  RECOMMENDATIONS: This patient's condition is appropriate for continued rehabilitative care in the following setting: CIR  when able to tolerate 3 hours therapy/day. Patient has agreed to participate in recommended program. Yes Note that insurance prior authorization may be required for reimbursement for recommended care.  Comment: Rehab Admissions Coordinator to follow up.  Maryla Morrow, MD, [place] Dom Charlton Amor., PA-C 03/16/2017    Revision History              Routing History

## 2017-03-17 NOTE — Progress Notes (Signed)
Luis [name]  WUJ:[id number] DOB: 01-May-1966 DOA: 03/01/2017 PCP: [name], MD    Brief Narrative:  51 y.o. male w/ HTN, Afib, combined NICM/CHF, morbid obesity, HLD, and tobacco abuse who presented 4/29 with worseningDOE, BL LE edema and orthopnea for several weeks. He required intubation for acute respiratory failure 2nd to influenza A with pneumonia.  Significant Events: 04/29 - Admit / intubated in ED for respiratory failure 05/01 - Started on Solu-Medrol 05/03 - Solu-Medrol stopped  05/04 - Transitioned off pressor support (Levophed) & started on Prednisone from Solu-Medrol 05/05 - Precedex started & Fleet Enema given 05/06 - new fever & cultures obtained w/ broadening of antibiotics. 05/07 - Precedex stopped. Patient given 2 doses of Lactulose via tube. Reglan IV started. 05/08 - Tele Sitter ordered for patient safety due to staff concern that mother removed central venous catheter & lateral transfer from CCU to MICU 05/09 - Tube feeding stopped 5/12 TR[facility] assumed care   Subjective: Patient without complaints today. Pt has been ambulating in the room.  He did not wear his bipap last night.   Assessment & Plan:  Acute hypoxic and hypercarbic respiratory failure Multifactorial - improving - cont to wean O2 as able, now down to 3L [place].     Acute on chronic Combined systolic and diastolic CHF w/ acute exacerbation / pulm edema TTE 03/03/2017 noted EF 50-55%, no WMA, and grade 1 DD - net negative ~3.8L since admit - follow wgts and Is/Os. Weight is down 11 lbs since admit.    Filed Weights   03/15/17 0330 03/16/17 0318 03/17/17 0527  Weight: (!) 157 kg (346 lb 2 oz) (!) 158.6 kg (349 lb 10.4 oz) (!) 153.5 kg (338 lb 6.4 oz)   OSA/OHS Noncompliant w/ home CPAP use - BiPAP ordered daily at bedtime and as needed per PCCM - tolerating well thus far but had declined to use, encouraged him to use it regularly    Sepsis - possible RLL PNA v/s atx Completed empiric  antibiotic therapy - sepsis physiology resolved  Influenza A pneumonia Has completed a course of Tamiflu  Acute renal injury  crt slowly improving - follow trend   Recent Labs Lab 03/12/17 0502 03/13/17 0202 03/14/17 0401 03/15/17 0311 03/16/17 0252  CREATININE 1.86* 1.47* 1.31*  1.28* 1.37* 1.34*   Colonic ileus on Reglan -tolerating solid diet and having BMs.  Switch reglan to oral formulation from IV 5/15.  Likely can DC soon.   Acute encephalopathy Minimize sedating meds - ambulate - appears mental status is much improved.    COPD with acute exacerbation Cont medical tx - with bronchodilators. He is not on any steroids at this time. No evidence of wheezing. Continue nightly bipap.  Needs outpt PFTs but does NOT need home nebs per PCCM - no active wheezing today   Paroxysmal atrial fibrillation No systemic anticoagulation given history of nonadherence He is back in Afib and had two 4-5 sec pauses, likely secondary to beta blockers.  Follow closely.  If persists, consult cardiology.   HTN BP recently better controlled - adjust medical tx and follow  DM2 CBG controlled  CBG (last 3)   Recent Labs  03/16/17 1643 03/16/17 2112 03/17/17 0746  GLUCAP 119* 190* 145*   Morbid Obesity - Body mass index is 45.9 kg/m.  DVT prophylaxis: SQ heparin Code Status: DO NOT INTUBATE Family Communication: no family present  Disposition Plan: SDU, PT/OT have recommended CIR, awaiting insurance approval for CIR  Consultants:  PCCM  Antimicrobials:  Rocephin 4/29 - 5/4 Azithromycin 4/29 - 5/1 Tamiflu 4/30 - 5/4 Vancomycin 4/30 - 5/2 + 5/6 - 5/9 Zosyn 5/6 > 5/12  Review of systems: as noted in history otherwise all reviewed and reported negative  Objective: Blood pressure (!) 161/99, pulse 81, temperature 97.8 F (36.6 C), temperature source Oral, resp. rate 18, height 6' (1.829 m), weight (!) 153.5 kg (338 lb 6.4 oz), SpO2 97 %.  Intake/Output Summary (Last 24 hours)  at 03/17/17 0802 Last data filed at 03/17/17 0600  Gross per 24 hour  Intake             1040 ml  Output             1751 ml  Net             -711 ml   Filed Weights   03/15/17 0330 03/16/17 0318 03/17/17 0527  Weight: (!) 157 kg (346 lb 2 oz) (!) 158.6 kg (349 lb 10.4 oz) (!) 153.5 kg (338 lb 6.4 oz)    Examination: General: morbidly obese male, awake, alert, oriented.  Cooperative.  No acute respiratory distress Lungs: clear bilaterally no wheezes or crackles heard.  Cardiovascular: RRR without murmur - distant BS  Abdomen: Nontender, morbidly obese, soft, bowel sounds positive, no rebound, no ascites, no appreciable mass Extremities: no significant edema BLE   CBC:  Recent Labs Lab 03/12/17 0502 03/13/17 0202 03/14/17 0401 03/15/17 0311 03/16/17 0252  WBC 12.8* 9.3 9.1 10.3 9.7  NEUTROABS 9.4* 5.6 5.7  --   --   HGB 12.1* 12.2* 13.0 12.5* 12.6*  HCT 39.9 39.7 42.4 40.3 39.8  MCV 98.8 99.7 99.3 99.0 96.6  PLT 203 197 198 192 202   Basic Metabolic Panel:  Recent Labs Lab 03/12/17 0502 03/13/17 0202 03/14/17 0401 03/15/17 0311 03/16/17 0252 03/16/17 0732  NA 149* 149* 151*  149* 147* 144  --   K 3.8 3.2* 3.6  3.5 3.3* 2.9*  --   CL 110 113* 114*  113* 113* 108  --   CO2 28 28 26  26 27 25   --   GLUCOSE 180* 134* 139*  139* 123* 147*  --   BUN 43* 32* 25*  25* 23* 24*  --   CREATININE 1.86* 1.47* 1.31*  1.28* 1.37* 1.34*  --   CALCIUM 8.3* 8.2* 8.4*  8.4* 8.2* 8.1*  --   MG 2.3 2.2 2.4  --   --  2.2  PHOS 4.1 2.4* 3.3  --   --   --    GFR: Estimated Creatinine Clearance: 101.9 mL/min (A) (by C-G formula based on SCr of 1.34 mg/dL (H)).  Liver Function Tests:  Recent Labs Lab 03/12/17 0502 03/13/17 0202 03/14/17 0401 03/15/17 0311  AST  --   --   --  37  ALT  --   --   --  55  ALKPHOS  --   --   --  67  BILITOT  --   --   --  0.8  PROT  --   --   --  6.9  ALBUMIN 2.5* 2.6* 2.6* 2.7*   No results for input(s): LIPASE, AMYLASE in the last  168 hours.  HbA1C: Hgb A1c MFr Bld  Date/Time Value Ref Range Status  03/01/2017 06:40 PM 8.2 (H) 4.8 - 5.6 % Final    Comment:    (NOTE)         Pre-diabetes: 5.7 - 6.4  Diabetes: >6.4         Glycemic control for adults with diabetes: <7.0     CBG:  Recent Labs Lab 03/16/17 0817 03/16/17 1144 03/16/17 1643 03/16/17 2112 03/17/17 0746  GLUCAP 114* 199* 119* 190* 145*    Recent Results (from the past 240 hour(s))  Culture, blood (Routine X 2) w Reflex to ID Panel     Status: None   Collection Time: 03/08/17 11:00 PM  Result Value Ref Range Status   Specimen Description BLOOD RIGHT ARM  Final   Special Requests   Final    BOTTLES DRAWN AEROBIC AND ANAEROBIC Blood Culture adequate volume   Culture NO GROWTH 5 DAYS  Final   Report Status 03/13/2017 FINAL  Final  Culture, blood (Routine X 2) w Reflex to ID Panel     Status: None   Collection Time: 03/08/17 11:08 PM  Result Value Ref Range Status   Specimen Description BLOOD LEFT ANTECUBITAL  Final   Special Requests   Final    BOTTLES DRAWN AEROBIC ONLY Blood Culture adequate volume   Culture NO GROWTH 5 DAYS  Final   Report Status 03/13/2017 FINAL  Final  Culture, respiratory (NON-Expectorated)     Status: None   Collection Time: 03/09/17 10:09 PM  Result Value Ref Range Status   Specimen Description TRACHEAL ASPIRATE  Final   Special Requests Normal  Final   Gram Stain   Final    ABUNDANT WBC PRESENT,BOTH PMN AND MONONUCLEAR NO ORGANISMS SEEN    Culture Consistent with normal respiratory flora.  Final   Report Status 03/12/2017 FINAL  Final     Scheduled Meds: . budesonide (PULMICORT) nebulizer solution  0.5 mg Nebulization BID  . feeding supplement (ENSURE ENLIVE)  237 mL Oral BID BM  . heparin subcutaneous  5,000 Units Subcutaneous Q8H  . insulin aspart  0-5 Units Subcutaneous QHS  . insulin aspart  0-9 Units Subcutaneous TID WC  . insulin glargine  10 Units Subcutaneous Daily  .  ipratropium-albuterol  3 mL Nebulization TID  . mouth rinse  15 mL Mouth Rinse BID  . metoCLOPramide  5 mg Oral TID AC  . metoprolol tartrate  37.5 mg Oral BID  . pantoprazole  40 mg Oral Q1200    LOS: 16 days   Maryln Manuel, MD [facility] Office  [phone] Pager - Text Page per Loretha Stapler as per below:  On-Call/Text Page:      [email or web address]      password [email or web address]  If 7PM-7AM, please contact night-coverage [email or web address] Password [email or web address] 03/17/2017, 8:02 AM

## 2017-03-17 NOTE — PMR Pre-admission (Signed)
PMR Admission Coordinator Pre-Admission Assessment  Patient: Luis [name] is an 51 y.o., male MRN: [id number] DOB: 1966-03-14 Height: 6' (182.9 cm) Weight: (!) 153.5 kg (338 lb 6.4 oz)              Insurance Information HMO:     PPO: yes     PCP:      IPA:      80/20:      OTHER:  PRIMARY: Charlotte Crumb      Policy#: [id number]      Subscriber: pt CM Name: Gerarda Gunther      Phone#: [phone] ext [phone]     Fax#: [phone] Pre-Cert#: [id number]      Employer: [employer] Benefits:  Phone #: [phone]     Name: 03/16/17 Eff. Date: 11/03/12     Deduct: $500      Out of Pocket Max: $3000      Life Max: none CIR: 80 %      SNF: 80% 120 days Outpatient: 80%     Co-Pay: 60 visits PT and OT combined; 90 visits SLP Home Health: 80%      Co-Pay: 200 visits DME: 80%     Co-Pay: [phone] Providers: in network  SECONDARY: none      Medicaid Application Date:       Case Manager:  Disability Application Date:       Case Worker:   Emergency Contact Information Contact Information    Name Relation Home Work Mobile   Patrick,Marilyn Mother [phone]  [phone]     Current Medical History  Patient Admitting Diagnosis: debility  History of Present Illness: HPI: Tian Mcmurtrey a 51 y.o.right handed malewith history of COPD with tobacco abuse, NICM,atrial fibrillation, 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 Noncompliant with medications over the past several months. Patient reported to be on no prescription medications at time of admission. Presented 03/01/2017 with productive cough, myalgias as well as increased lower extremity edema. Oxygen saturation 84% on room air placed on CPAP. Chest x-ray reviewed, showing b/l infiltrates. Per report, diffuse patchy bilateral infiltrates as well as cardiomegaly concerning for pulmonary edema. Hyponatremia 132, BNP 243, troponin negative, urine drug screen negative.  Patient required intubation and placed on broad-spectrum antibiotics. Maintain on IV diuresis. Echocardiogram with ejection fraction of 55% no wall motion abnormality grade 1 diastolic dysfunction.  Antibiotic therapy course has been completed for influenza A pneumonia. Remainedintubated through 03/12/2017.AKI 2.06 improved with gentle hydration and latest creatinine 1.28. Demonstrating some impaired memory and awareness deficits subcutaneous heparin for DVT prophylaxis. Diet has been advanced to a regular consistency.   Past Medical History  Past Medical History:  Diagnosis Date  . Atrial fibrillation (HCC)   . CHF (congestive heart failure) (HCC)   . DM (diabetes mellitus) (HCC)   . HTN (hypertension)   . Hyperlipidemia   . Obesity   . OSA (obstructive sleep apnea)     Family History  family history is not on file.  Prior Rehab/Hospitalizations:  Has the patient had major surgery during 100 days prior to admission? No  Current Medications   Current Facility-Administered Medications:  .  acetaminophen (TYLENOL) suppository 650 mg, 650 mg, Rectal, Q4H PRN, Roslynn Amble, MD .  acetaminophen (TYLENOL) tablet 650 mg, 650 mg, Oral, Q6H PRN, Jetty Duhamel T, MD .  budesonide (PULMICORT) nebulizer solution 0.5 mg, 0.5 mg, Nebulization, BID, de Dios, Dalhart A, MD, 0.5 mg at 03/17/17 5573 .  famotidine (PEPCID) tablet 20 mg, 20 mg, Oral, BID PRN, Opyd, Lavone Neri, MD, 20 mg at 03/16/17 0023 .  feeding supplement (ENSURE ENLIVE) (ENSURE ENLIVE) liquid 237 mL, 237 mL, Oral, BID BM, Johnson, Clanford L, MD .  glycopyrrolate (ROBINUL) injection 0.2 mg, 0.2 mg, Intravenous, Q4H PRN, Desai, Rahul P, PA-C, 0.2 mg at 03/08/17 0829 .  heparin injection 5,000 Units, 5,000 Units, Subcutaneous, Q8H, Nelda Bucks, MD, 5,000 Units at 03/17/17 [id number] .  hydrALAZINE (APRESOLINE) injection 10-40 mg, 10-40 mg, Intravenous, Q4H PRN, Karl Ito, MD, 20 mg at 03/16/17 0517 .  insulin  aspart (novoLOG) injection 0-5 Units, 0-5 Units, Subcutaneous, QHS, Lonia Blood, MD, 2 Units at 03/15/17 1237 .  insulin aspart (novoLOG) injection 0-9 Units, 0-9 Units, Subcutaneous, TID WC, Lonia Blood, MD, 1 Units at 03/17/17 1223 .  insulin glargine (LANTUS) injection 10 Units, 10 Units, Subcutaneous, Daily, Oretha Milch, MD, 10 Units at 03/17/17 0847 .  ipratropium-albuterol (DUONEB) 0.5-2.5 (3) MG/3ML nebulizer solution 3 mL, 3 mL, Nebulization, TID, Johnson, Clanford L, MD, 3 mL at 03/17/17 0923 .  MEDLINE mouth rinse, 15 mL, Mouth Rinse, BID, Nelda Bucks, MD, 15 mL at 03/16/17 2154 .  metoCLOPramide (REGLAN) tablet 5 mg, 5 mg, Oral, TID AC, Johnson, Clanford L, MD, 5 mg at 03/17/17 1223 .  metoprolol tartrate (LOPRESSOR) tablet 37.5 mg, 37.5 mg, Oral, BID, Johnson, Clanford L, MD, 37.5 mg at 03/17/17 1223 .  pantoprazole (PROTONIX) EC tablet 40 mg, 40 mg, Oral, Q1200, Lonia Blood, MD, 40 mg at 03/17/17 1223 .  potassium chloride SA (K-DUR,KLOR-CON) CR tablet 40 mEq, 40 mEq, Oral, Once, Johnson, Clanford L, MD  Patients Current Diet: Diet 2 gram sodium Room service appropriate? 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 None of the above  Prior Functional Level Prior Function Level of Independence: Independent Comments: [employer] from home  Self Care: Did the patient need help bathing, dressing, using the toilet or eating?  Independent  Indoor Mobility: Did the patient  need assistance with walking from room to room (with or without device)? Independent  Stairs: Did the patient need assistance with internal or external stairs (with or without device)? Independent  Functional Cognition: Did the patient need help planning regular tasks such as shopping or remembering to take medications? Independent  Current Functional Level Cognition  Overall Cognitive Status: Within Functional Limits for tasks assessed Orientation Level: Oriented X4 Safety/Judgement: Decreased awareness of safety, Decreased awareness of deficits    Extremity Assessment (includes Sensation/Coordination)  Upper Extremity Assessment: Generalized weakness (3-/5 shoulder, 3+/5 elbow to hand)  Lower Extremity Assessment: Defer to PT evaluation    ADLs  Overall ADL's : Needs assistance/impaired Eating/Feeding: Supervision/ safety, Sitting Eating/Feeding Details (indicate cue type and reason): drank ginger ale, cues to slow pace Grooming: Oral care, Set up Grooming Details (indicate cue type and reason): sitting unsupported on side of bed Upper Body Bathing: Moderate assistance, Sitting Lower Body Bathing: Total assistance, Bed level Upper Body Dressing : Moderate assistance, Sitting Lower Body Dressing: Total assistance, Bed  level General ADL Comments: Pt sat EOB x 20 minutes with supervision.    Mobility  Overal bed mobility: Needs Assistance Bed Mobility: Supine to Sit Rolling: Mod assist Supine to sit: Min guard Sit to supine: Min assist General bed mobility comments: bed flat, but heavy use of rail    Transfers  Overall transfer level: Needs assistance Equipment used: Rolling walker (2 wheeled) Transfers: Sit to/from Stand Sit to Stand: Min assist, From elevated surface General transfer comment: Pt stood with MIN A with verbal cues for hand placement. Pt with BM on bed pad and unaware he had BM.  SPT with MIN A and cueing for steps.    Ambulation / Gait / Stairs / Wheelchair  Mobility  Ambulation/Gait General Gait Details: deferred due to needing to be cleaned    Posture / Balance Balance Overall balance assessment: Needs assistance Sitting-balance support: No upper extremity supported, Feet supported Sitting balance-Leahy Scale: Good Standing balance support: Bilateral upper extremity supported Standing balance-Leahy Scale: Poor Standing balance comment: requires UE support    Special needs/care consideration BiPAP/CPAP pt reports he does not have CPAP at home; MD states noncompliant with CPAP pta Using BiPAP now in hospital at [facility] Continuous Drip IV  N/a Dialysis  N/a Life Vest  N/a Oxygen at 2 liters nasal cannula; no home O2 pta Special Bed  N/a Trach Size  N/a Wound Vac n/a Skin ecchymosis to BUE Bowel mgmt: incontinent LBM 03/17/17 Bladder mgmt: incontinent at times; urinal Diabetic mgmt Hgb A1c 8.2 Droplet precautions discontinued 03/17/17 Tele sitter discontinued 03/17/17    Previous Home Environment Living Arrangements:  (his Mom lives with him and 3 grandchildren he is caregiver f)  Lives With: Family Available Help at Discharge: Family, Available 24 hours/day (Mom avaialble 24/7 supervision level) Type of Home: Apartment Home Layout: One level Home Access: Stairs to enter [employer] of Steps: 6 Bathroom Shower/Tub: Hydrographic surveyor, Engineer, building services: Standard Bathroom Accessibility: Yes How Accessible: Accessible via walker Home Care Services: No  Grandchildren ar 58, 28 and 45 years old. 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  Discharge Living Setting Plans for Discharge Living Setting: Apartment, Lives with (comment) (Mom and 3 young grandchildren) Type of Home at Discharge: Apartment Discharge Home Access: Stairs to enter Entrance Stairs-Number of Steps: 6 Discharge Bathroom Shower/Tub: Tub/shower unit, Curtain Discharge Bathroom Toilet: Standard Discharge Bathroom Accessibility: Yes How Accessible: Accessible via  walker Does the patient have any problems obtaining your medications?: No  Social/Family/Support Systems Patient Roles: Parent, Other (Comment) (employee) Contact Information: Theatre stage manager, his mother Anticipated Caregiver: Mother Anticipated Caregiver's Contact Information: see above Ability/Limitations of Caregiver: supervision level Caregiver Availability: 24/7 Discharge Plan Discussed with Primary Caregiver: Yes (Mom lacks awarness of pt's full deficits. Mom wanted pt to d) Is Caregiver In Agreement with Plan?: No (Mom wanted d/c directly home) Does Caregiver/Family have Issues with Lodging/Transportation while Pt is in Rehab?: No   Mom wanted him discharged directly home. Patient at first refused CIR admit for he wanted to get home to his grandchildren. Pt lacks insight into his deficits. Mom focused on that he has had a long complicated hospitalization and needs him home. She is not in agreement to the need for admit today.  Goals/Additional Needs Patient/Family Goal for Rehab: supervision PT and OT Expected length of stay: ELOS 10- 14 days Pt/Family Agrees to Admission and willing to participate: Yes Program Orientation Provided & Reviewed with Pt/Caregiver Including Roles  & Responsibilities: Yes  Decrease burden of Care  through IP rehab admission: n/a  Possible need for SNF placement upon discharge: not anticipated.   Patient Condition: This patient's condition remains as documented in the consult dated 03/16/2017, in which the Rehabilitation Physician determined and documented that the patient's condition is appropriate for intensive rehabilitative care in an inpatient rehabilitation facility. Will admit to inpatient rehab today.  Preadmission Screen Completed By:  Clois Dupes, 03/17/2017 1:14 PM ______________________________________________________________________   Discussed status with Dr. Riley Kill on 03/17/2017 at  1313 and received telephone approval for admission  today.  Admission Coordinator:  Clois Dupes, time 1308 Date 03/17/2017.

## 2017-03-17 NOTE — H&P (Signed)
Physical Medicine and Rehabilitation Admission H&P    Chief Complaint  Patient presents with  . Shortness of Breath  : HPI: Luis [name] is a 51 y.o. 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 Noncompliant with medications over the past several months. Patient reported to be on no prescription medications at time of admission. Per chart review  patient independent prior to admission living alone. One level home with 6 steps to entry. Works as [employer]. Family in the area plans to provide assistance on discharge. Presented 03/01/2017 with productive cough, myalgias as well as increased lower extremity edema. Oxygen saturation 84% on room air placed on CPAP. Chest x-ray reviewed, showing b/l infiltartes. Per report, diffuse patchy bilateral infiltrates as well as cardiomegaly concerning for pulmonary edema. Hyponatremia 132, BNP 243, troponin negative, urine drug screen negative. Patient required intubation and placed on broad-spectrum antibiotics. Maintain on IV diuresis. Echocardiogram with ejection fraction of 55% no wall motion abnormality grade 1 diastolic dysfunction.  Antibiotic therapy course has been completed for influenza A pneumonia. Remained intubated through 03/12/2017.AKI 2.06 improved with gentle hydration and latest creatinine 1.28. Demonstrating some impaired memory and awareness deficits subcutaneous heparin for DVT prophylaxis. Diet has been advanced to a regular consistency. Physical and occupational therapy evaluations completed with recommendations of physical medicine rehabilitation consult.Patient was admitted for a comprehensive rehab program  Review of Systems  Constitutional: Positive for chills and malaise/fatigue. Negative for fever.  HENT: Negative for hearing loss.   Eyes: Negative for blurred  vision and double vision.  Respiratory: Positive for cough, shortness of breath and wheezing.   Cardiovascular: Positive for palpitations and leg swelling.  Gastrointestinal: Positive for constipation. Negative for nausea and vomiting.  Genitourinary: Negative for dysuria, flank pain and hematuria.  Musculoskeletal: Positive for myalgias.  Skin: Negative for rash.  Neurological: Negative for seizures.  All other systems reviewed and are negative.  Past Medical History:  Diagnosis Date  . Atrial fibrillation ([place])   . CHF (congestive heart failure) ([place])   . DM (diabetes mellitus) ([place])   . HTN (hypertension)   . Hyperlipidemia   . Obesity   . OSA (obstructive sleep apnea)    History reviewed. No pertinent surgical history. History reviewed. No pertinent family history. Social History:  reports that he quit smoking about 8 years ago. He has never used smokeless tobacco. He reports that he does not drink alcohol. His drug history is not on file. Allergies:  Allergies  Allergen Reactions  . Demerol [Meperidine] Itching  . Morphine And Related Itching   Medications Prior to Admission  Medication Sig Dispense Refill  . [DISCONTINUED] DM-APAP-CPM (VICKS NYQUIL COLD & FLU NIGHT PO) Take 15 mLs by mouth every 8 (eight) hours as needed (for symptoms).      Home: Home Living Family/patient expects to be discharged to:: Private residence Living Arrangements: Alone Available Help at Discharge: Family, Available PRN/intermittently Type of Home: Apartment Home Access: Stairs to enter [employer] of Steps: 6 Home Layout: One level Bathroom Shower/Tub: Chiropodist: Standard Home Equipment: None   Functional History: Prior Function Level of Independence: Independent Comments: works at a [employer] Status:  Mobility: Campus bed mobility: Needs Assistance Bed Mobility: Supine to Sit Rolling: Mod assist Supine to  sit: Min guard, HOB elevated Sit to supine:  Min assist General bed mobility comments: No physical assist required today.  Assist for safety only.  Patient able to maintain sitting balance at EOB. Transfers Overall transfer level: Needs assistance Equipment used: Rolling walker (2 wheeled) Transfers: Sit to/from Stand Sit to Stand: Mod assist, +2 physical assistance General transfer comment: From regular height surface.  Verbal cues for hand placement.  Assist to power up to stance.  Patient stood x2 for approx 2 minutes each.  Able to step in place in stance.  Fatigued quickly with increased RR.  Cues to slow breathing. Ambulation/Gait General Gait Details: Unable today    ADL: ADL Overall ADL's : Needs assistance/impaired Eating/Feeding: Supervision/ safety, Sitting Eating/Feeding Details (indicate cue type and reason): drank ginger ale, cues to slow pace Grooming: Oral care, Set up Grooming Details (indicate cue type and reason): sitting unsupported on side of bed Upper Body Bathing: Moderate assistance, Sitting Lower Body Bathing: Total assistance, Bed level Upper Body Dressing : Moderate assistance, Sitting Lower Body Dressing: Total assistance, Bed level General ADL Comments: Pt sat EOB x 20 minutes with supervision.  Cognition: Cognition Overall Cognitive Status: Within Functional Limits for tasks assessed Orientation Level: Oriented X4 Cognition Arousal/Alertness: Awake/alert Behavior During Therapy: WFL for tasks assessed/performed Overall Cognitive Status: Within Functional Limits for tasks assessed Area of Impairment: Memory, Safety/judgement Orientation Level: Disoriented to, Time Memory: Decreased short-term memory Safety/Judgement: Decreased awareness of deficits, Decreased awareness of safety  Physical Exam: Blood pressure (!) 161/99, pulse 81, temperature 97.8 F (36.6 C), temperature source Oral, resp. rate 18, height 6' (1.829 m), weight (!) 153.5 kg (338 lb  6.4 oz), SpO2 97 %. Physical Exam  Constitutional: He is oriented to person, place, and time.  51 year old right-handed male morbidly obese appearing older than stated age  HENT:  Head: Normocephalic.  Eyes: EOM are normal. Pupils are equal, round, and reactive to light.  Neck: Normal range of motion. Neck supple. No tracheal deviation present. No thyromegaly present.  Cardiovascular: Normal rate and regular rhythm.   Cardiac rate control  Respiratory: No respiratory distress. He has no wheezes. He has no rales.  Fair inspiratory effort  GI: Soft. Bowel sounds are normal. He exhibits no distension. There is no tenderness. There is no rebound.  Musculoskeletal: He exhibits edema (1+ lower extremity).  Neurological: He is alert and oriented to person, place, and time. No cranial nerve deficit.  Patient is soft spoken. Follows basic commands. Reasonable insight.   4/5 deltoid, 4/5 bicep, 4/5 tricep, 4/5 wrist extension, 4/5 hand intrinsics.      3/5 hip flexor, 3/5 knee extension, 4/5 ankle dorsiflexion, 4/5 ankle plantarflexion. Senses pain in all 4's.  Psych: pleasant and appropriate Skin: warm/dry    Results for orders placed or performed during the hospital encounter of 03/01/17 (from the past 48 hour(s))  Glucose, capillary     Status: Abnormal   Collection Time: 03/15/17 11:42 AM  Result Value Ref Range   Glucose-Capillary 230 (H) 65 - 99 mg/dL  Glucose, capillary     Status: Abnormal   Collection Time: 03/15/17  3:59 PM  Result Value Ref Range   Glucose-Capillary 148 (H) 65 - 99 mg/dL  Glucose, capillary     Status: Abnormal   Collection Time: 03/15/17  9:31 PM  Result Value Ref Range   Glucose-Capillary 182 (H) 65 - 99 mg/dL  CBC     Status: Abnormal   Collection Time: 03/16/17  2:52 AM  Result Value Ref Range   WBC 9.7 4.0 -  10.5 K/uL   RBC 4.12 (L) 4.22 - 5.81 MIL/uL   Hemoglobin 12.6 (L) 13.0 - 17.0 g/dL   HCT 39.8 39.0 - 52.0 %   MCV 96.6 78.0 - 100.0 fL   MCH 30.6  26.0 - 34.0 pg   MCHC 31.7 30.0 - 36.0 g/dL   RDW 14.5 11.5 - 15.5 %   Platelets 202 150 - 400 K/uL  Basic metabolic panel     Status: Abnormal   Collection Time: 03/16/17  2:52 AM  Result Value Ref Range   Sodium 144 135 - 145 mmol/L   Potassium 2.9 (L) 3.5 - 5.1 mmol/L   Chloride 108 101 - 111 mmol/L   CO2 25 22 - 32 mmol/L   Glucose, Bld 147 (H) 65 - 99 mg/dL   BUN 24 (H) 6 - 20 mg/dL   Creatinine, Ser 1.34 (H) 0.61 - 1.24 mg/dL   Calcium 8.1 (L) 8.9 - 10.3 mg/dL   GFR calc non Af Amer >60 >60 mL/min   GFR calc Af Amer >60 >60 mL/min    Comment: (NOTE) The eGFR has been calculated using the CKD EPI equation. This calculation has not been validated in all clinical situations. eGFR's persistently <60 mL/min signify possible Chronic Kidney Disease.    Anion gap 11 5 - 15  Magnesium     Status: None   Collection Time: 03/16/17  7:32 AM  Result Value Ref Range   Magnesium 2.2 1.7 - 2.4 mg/dL  Glucose, capillary     Status: Abnormal   Collection Time: 03/16/17  8:17 AM  Result Value Ref Range   Glucose-Capillary 114 (H) 65 - 99 mg/dL  Glucose, capillary     Status: Abnormal   Collection Time: 03/16/17 11:44 AM  Result Value Ref Range   Glucose-Capillary 199 (H) 65 - 99 mg/dL  Glucose, capillary     Status: Abnormal   Collection Time: 03/16/17  4:43 PM  Result Value Ref Range   Glucose-Capillary 119 (H) 65 - 99 mg/dL  Glucose, capillary     Status: Abnormal   Collection Time: 03/16/17  9:12 PM  Result Value Ref Range   Glucose-Capillary 190 (H) 65 - 99 mg/dL   Comment 1 Notify RN    Comment 2 Document in Chart   Glucose, capillary     Status: Abnormal   Collection Time: 03/17/17  7:46 AM  Result Value Ref Range   Glucose-Capillary 145 (H) 65 - 99 mg/dL   Comment 1 Notify RN    Comment 2 Document in Chart    No results found.     Medical Problem List and Plan: 1.  Decreased functional mobility with debilitation secondary to acute hypoxic respiratory failure/Acute  encephalopathy  -admit to inpatient rehab 2.  DVT Prophylaxis/Anticoagulation: SQ heparin. Check vascular study 3. Pain Management: Tylenol as needed 4. Mood: Provide emotional support 5. Neuropsych: This patient is capable of making decisions on his own behalf. 6. Skin/Wound Care: Routine skin checks 7. Fluids/Electrolytes/Nutrition: Routine I&O with follow up chemestries 8. Morbid obesity. BMI 45.9 KG. Dietary follow-up 9. Diabetes mellitus with peripheral neuropathy. Hemoglobin A1c 8.2. Lantus insulin 10 units daily. Check blood sugars before meals and at bedtime 10.COPD. Continue nebulizer treatments check oxygen saturations every shift  -continue on 2L oxygen via [place] for now 11.Hypertension. Lopressor 37.5 mg twice a day. Monitor with increased mobility 12.PAF. Cardiac rate controlled. Continue beta blocker 13. Combined diastolic and systolic congestive heart failure. Monitor for any signs of fluid overload  14. Hypokalemia. Follow-up chemistries 15.AKI. Creatinine stabilized at 1.28. Follow-up chemistries 16. Medical noncompliance. Counseling   Post Admission Physician Evaluation: 1. Functional deficits secondary  to debility, encephalopathy. 2. Patient is admitted to receive collaborative, interdisciplinary care between the physiatrist, rehab nursing staff, and therapy team. 3. Patient's level of medical complexity and substantial therapy needs in context of that medical necessity cannot be provided at a lesser intensity of care such as a SNF. 4. Patient has experienced substantial functional loss from his/her baseline which was documented above under the "Functional History" and "Functional Status" headings.  Judging by the patient's diagnosis, physical exam, and functional history, the patient has potential for functional progress which will result in measurable gains while on inpatient rehab.  These gains will be of substantial and practical use upon discharge  in facilitating  mobility and self-care at the household level. 5. Physiatrist will provide 24 hour management of medical needs as well as oversight of the therapy plan/treatment and provide guidance as appropriate regarding the interaction of the two. 6. The Preadmission Screening has been reviewed and patient status is unchanged unless otherwise stated above. 7. 24 hour rehab nursing will assist with bladder management, bowel management, safety, skin/wound care, disease management, medication administration, pain management and patient education  and help integrate therapy concepts, techniques,education, etc. 8. PT will assess and treat for/with: Lower extremity strength, range of motion, stamina, balance, functional mobility, safety, adaptive techniques and equipment, NMR, activity tolerance, family ed.   Goals are: mod I to supervision. 9. OT will assess and treat for/with: ADL's, functional mobility, safety, upper extremity strength, adaptive techniques and equipment, NMR, family education, ego support, community reintegration.   Goals are: mod I to set up. Therapy may proceed with showering this patient. 10. SLP will assess and treat for/with: n/a.  Goals are: n/a. 11. Case Management and Social Worker will assess and treat for psychological issues and discharge planning. 12. Team conference will be held weekly to assess progress toward goals and to determine barriers to discharge. 13. Patient will receive at least 3 hours of therapy per day at least 5 days per week. 14. ELOS: 7-10 days       15. Prognosis:  excellent     Meredith Staggers, MD, [facility] 03/17/2017  Cathlyn Parsons., PA-C 03/17/2017

## 2017-03-17 NOTE — Progress Notes (Signed)
pT REFUSES BIPAP AT THIS TIME.  pT WANTS TO BE PLACED ON [place] FOR SLEEP.  Rt WILL MONITOR.

## 2017-03-17 NOTE — Progress Notes (Signed)
I await insurance approval to possible admit pt to inpt rehab today pending insurance. I contacted Dr. Laural Benes and he is aware. We also discussed removal of droplet precautions. I updated pt's RN by phone and she will clarify need for tele sitter today. [phone]

## 2017-03-17 NOTE — Progress Notes (Signed)
Standley Brooking, RN Rehab Admission Coordinator Signed Physical Medicine and Rehabilitation  PMR Pre-admission Date of Service: 03/17/2017 1:03 PM  Related encounter: ED to Hosp-Admission (Discharged) from 03/01/2017 in [facility] 3E CHF       [] Hide copied text PMR Admission Coordinator Pre-Admission Assessment  Patient: Luis [name] is an 51 y.o., male MRN: [id number] DOB: [birth year] Height: 6' (182.9 cm) Weight: (!) 153.5 kg (338 lb 6.4 oz)                                                                                                                                                  Insurance Information HMO:     PPO: yes     PCP:      IPA:      80/20:      OTHER:  PRIMARYCharlotte Crumb      Policy#: [id number]      Subscriber: pt CM Name: Gerarda Gunther      Phone#: [phone] ext [phone]     Fax#: [phone] Pre-Cert#: [id number]      Employer: [employer] Benefits:  Phone #: [phone]     Name: 03/16/17 Eff. Date: 11/03/12     Deduct: $500      Out of Pocket Max: $3000      Life Max: none CIR: 80 %      SNF: 80% 120 days Outpatient: 80%     Co-Pay: 60 visits PT and OT combined; 90 visits SLP Home Health: 80%      Co-Pay: 200 visits DME: 80%     Co-Pay: [phone] Providers: in network  SECONDARY: none      Medicaid Application Date:       Case Manager:  Disability Application Date:       Case Worker:   Emergency Contact Information        Contact Information    Name Relation Home Work Mobile   [name],Luis Mother [phone]  [phone]     Current Medical History  Patient Admitting Diagnosis: debility  History of Present Illness: HPI: Luis [name] a 51 y.o.right handed malewith history of COPD with tobacco abuse,NICM,atrial fibrillation, 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 Noncompliant with medications over the past several months. Patient reported  to be on no prescription medications at time of admission. Presented 03/01/2017 with productive cough, myalgias as well as increased lower extremity edema. Oxygen saturation 84% on room air placed on CPAP. Chest x-ray reviewed, showing b/l infiltrates. Per report, diffuse patchy bilateral infiltrates as well as cardiomegaly concerning for pulmonary edema. Hyponatremia 132, BNP 243, troponin negative, urine drug screen negative. Patient required intubation and placed on broad-spectrum antibiotics. Maintain on IV diuresis. Echocardiogram with ejection fraction of 55% no wall motion abnormality grade 1 diastolic dysfunction. Antibiotic therapy course has been  completed for influenza A pneumonia. Remainedintubated through 03/12/2017.AKI2.06 improved with gentle hydration and latest creatinine 1.28.Demonstrating some impaired memory and awareness deficits subcutaneous heparin for DVT prophylaxis. Diet has been advanced to a regular consistency.   Past Medical History      Past Medical History:  Diagnosis Date  . Atrial fibrillation (HCC)   . CHF (congestive heart failure) (HCC)   . DM (diabetes mellitus) (HCC)   . HTN (hypertension)   . Hyperlipidemia   . Obesity   . OSA (obstructive sleep apnea)     Family History  family history is not on file.  Prior Rehab/Hospitalizations:  Has the patient had major surgery during 100 days prior to admission? No  Current Medications   Current Facility-Administered Medications:  .  acetaminophen (TYLENOL) suppository 650 mg, 650 mg, Rectal, Q4H PRN, Roslynn Amble, MD .  acetaminophen (TYLENOL) tablet 650 mg, 650 mg, Oral, Q6H PRN, Jetty Duhamel T, MD .  budesonide (PULMICORT) nebulizer solution 0.5 mg, 0.5 mg, Nebulization, BID, de Dios, [place] A, MD, 0.5 mg at 03/17/17 5621 .  famotidine (PEPCID) tablet 20 mg, 20 mg, Oral, BID PRN, Opyd, Lavone Neri, MD, 20 mg at 03/16/17 0023 .  feeding supplement (ENSURE ENLIVE) (ENSURE  ENLIVE) liquid 237 mL, 237 mL, Oral, BID BM, Johnson, Clanford L, MD .  glycopyrrolate (ROBINUL) injection 0.2 mg, 0.2 mg, Intravenous, Q4H PRN, Desai, Rahul P, PA-C, 0.2 mg at 03/08/17 0829 .  heparin injection 5,000 Units, 5,000 Units, Subcutaneous, Q8H, Nelda Bucks, MD, 5,000 Units at 03/17/17 [phone] .  hydrALAZINE (APRESOLINE) injection 10-40 mg, 10-40 mg, Intravenous, Q4H PRN, Karl Ito, MD, 20 mg at 03/16/17 0517 .  insulin aspart (novoLOG) injection 0-5 Units, 0-5 Units, Subcutaneous, QHS, Lonia Blood, MD, 2 Units at 03/15/17 1237 .  insulin aspart (novoLOG) injection 0-9 Units, 0-9 Units, Subcutaneous, TID WC, Lonia Blood, MD, 1 Units at 03/17/17 1223 .  insulin glargine (LANTUS) injection 10 Units, 10 Units, Subcutaneous, Daily, Oretha Milch, MD, 10 Units at 03/17/17 0847 .  ipratropium-albuterol (DUONEB) 0.5-2.5 (3) MG/3ML nebulizer solution 3 mL, 3 mL, Nebulization, TID, Johnson, Clanford L, MD, 3 mL at 03/17/17 0923 .  MEDLINE mouth rinse, 15 mL, Mouth Rinse, BID, Nelda Bucks, MD, 15 mL at 03/16/17 2154 .  metoCLOPramide (REGLAN) tablet 5 mg, 5 mg, Oral, TID AC, Johnson, Clanford L, MD, 5 mg at 03/17/17 1223 .  metoprolol tartrate (LOPRESSOR) tablet 37.5 mg, 37.5 mg, Oral, BID, Johnson, Clanford L, MD, 37.5 mg at 03/17/17 1223 .  pantoprazole (PROTONIX) EC tablet 40 mg, 40 mg, Oral, Q1200, Lonia Blood, MD, 40 mg at 03/17/17 1223 .  potassium chloride SA (K-DUR,KLOR-CON) CR tablet 40 mEq, 40 mEq, Oral, Once, Johnson, Clanford L, MD  Patients Current Diet: Diet 2 gram sodium Room service appropriate? 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 None of the above  Prior Functional Level Prior Function Level of Independence: Independent Comments: [employer] from home  Self Care: Did the patient need help bathing, dressing, using the toilet  or eating?  Independent  Indoor Mobility: Did the patient need assistance with walking from room to room (with or without device)? Independent  Stairs: Did the patient need assistance with internal or external stairs (with or without device)? Independent  Functional Cognition: Did the patient need help planning regular tasks such as shopping or remembering to take medications? Independent  Current Functional Level Cognition  Overall Cognitive Status: Within Functional Limits for tasks assessed Orientation Level: Oriented X4 Safety/Judgement: Decreased awareness of safety, Decreased awareness of deficits    Extremity Assessment (includes Sensation/Coordination)  Upper Extremity Assessment: Generalized weakness (3-/5 shoulder, 3+/5 elbow to hand)  Lower Extremity Assessment: Defer to PT evaluation    ADLs  Overall ADL's : Needs assistance/impaired Eating/Feeding: Supervision/ safety, Sitting Eating/Feeding Details (indicate cue type and reason): drank ginger ale, cues to slow pace Grooming: Oral care, Set up Grooming Details (indicate cue type and reason): sitting unsupported on side of bed Upper Body Bathing: Moderate assistance, Sitting Lower Body Bathing: Total assistance, Bed level Upper Body Dressing : Moderate assistance, Sitting Lower Body Dressing: Total assistance, Bed level General ADL Comments: Pt sat EOB x 20 minutes with supervision.    Mobility  Overal bed mobility: Needs Assistance Bed Mobility: Supine to Sit Rolling: Mod assist Supine to sit: Min guard Sit to  supine: Min assist General bed mobility comments: bed flat, but heavy use of rail    Transfers  Overall transfer level: Needs assistance Equipment used: Rolling walker (2 wheeled) Transfers: Sit to/from Stand Sit to Stand: Min assist, From elevated surface General transfer comment: Pt stood with MIN A with verbal cues for hand placement. Pt with BM on bed pad and unaware he had BM.  SPT with MIN A and cueing for steps.    Ambulation / Gait / Stairs / Wheelchair Mobility  Ambulation/Gait General Gait Details: deferred due to needing to be cleaned    Posture / Balance Balance Overall balance assessment: Needs assistance Sitting-balance support: No upper extremity supported, Feet supported Sitting balance-Leahy Scale: Good Standing balance support: Bilateral upper extremity supported Standing balance-Leahy Scale: Poor Standing balance comment: requires UE support    Special needs/care consideration BiPAP/CPAP pt reports he does not have CPAP at home; MD states noncompliant with CPAP pta Using BiPAP now in hospital at HS Continuous Drip IV  N/a Dialysis  N/a Life Vest  N/a Oxygen at 2 liters nasal cannula; no home O2 pta Special Bed  N/a Trach Size  N/a Wound Vac n/a Skin ecchymosis to BUE Bowel mgmt: incontinent LBM 03/17/17 Bladder mgmt: incontinent at times; urinal Diabetic mgmt Hgb A1c 8.2 Droplet precautions discontinued 03/17/17 Tele sitter discontinued 03/17/17    Previous Home Environment Living Arrangements:  (his Mom lives with him and 3 grandchildren he is caregiver f)  Lives With: Family Available Help at Discharge: Family, Available 24 hours/day (Mom avaialble 24/7 supervision level) Type of Home: Apartment Home Layout: One level Home Access: Stairs to enter [employer] of Steps: 6 Bathroom Shower/Tub: Hydrographic surveyor, Engineer, building services: Standard Bathroom Accessibility: Yes How Accessible: Accessible via walker Home Care Services:  No  Grandchildren ar 41, 66 and 49 years old. 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  Discharge Living Setting Plans for Discharge Living Setting: Apartment, Lives with (comment) (Mom and 3 young grandchildren) Type of Home at Discharge: Apartment Discharge Home Access: Stairs to enter Entrance Stairs-Number of Steps: 6 Discharge Bathroom Shower/Tub: Tub/shower unit, Curtain Discharge Bathroom Toilet: Standard Discharge Bathroom Accessibility: Yes How Accessible: Accessible  via walker Does the patient have any problems obtaining your medications?: No  Social/Family/Support Systems Patient Roles: Parent, Other (Comment) (employee) Contact Information: Theatre stage manager, his mother Anticipated Caregiver: Mother Anticipated Caregiver's Contact Information: see above Ability/Limitations of Caregiver: supervision level Caregiver Availability: 24/7 Discharge Plan Discussed with Primary Caregiver: Yes (Mom lacks awarness of pt's full deficits. Mom wanted pt to d) Is Caregiver In Agreement with Plan?: No (Mom wanted d/c directly home) Does Caregiver/Family have Issues with Lodging/Transportation while Pt is in Rehab?: No   Mom wanted him discharged directly home. Patient at first refused CIR admit for he wanted to get home to his grandchildren. Pt lacks insight into his deficits. Mom focused on that he has had a long complicated hospitalization and needs him home. She is not in agreement to the need for admit today.  Goals/Additional Needs Patient/Family Goal for Rehab: supervision PT and OT Expected length of stay: ELOS 10- 14 days Pt/Family Agrees to Admission and willing to participate: Yes Program Orientation Provided & Reviewed with Pt/Caregiver Including Roles  & Responsibilities: Yes  Decrease burden of Care through IP rehab admission: n/a  Possible need for SNF placement upon discharge: not anticipated.   Patient Condition: This patient's condition remains as documented in the consult  dated 03/16/2017, in which the Rehabilitation Physician determined and documented that the patient's condition is appropriate for intensive rehabilitative care in an inpatient rehabilitation facility. Will admit to inpatient rehab today.  Preadmission Screen Completed By:  Clois Dupes, 03/17/2017 1:14 PM ______________________________________________________________________   Discussed status with Dr. Riley Kill on 03/17/2017 at  1313 and received telephone approval for admission today.  Admission Coordinator:  Clois Dupes, time 8811 Date 03/17/2017.       Cosigned by: Ranelle Oyster, MD at 03/17/2017 1:18 PM  Revision History

## 2017-03-17 NOTE — Progress Notes (Signed)
Pt is in agreement to admit to CIR today and insurance has approved. I met with pt at bedside with RN C< and ppt is in agreement. His Mom has left to pick up his grandchildren. I will make the arrangements to admit to CIR today. [phone]

## 2017-03-18 ENCOUNTER — Inpatient Hospital Stay (HOSPITAL_COMMUNITY): Payer: BLUE CROSS/BLUE SHIELD | Admitting: Physical Therapy

## 2017-03-18 ENCOUNTER — Inpatient Hospital Stay (HOSPITAL_COMMUNITY): Payer: BLUE CROSS/BLUE SHIELD

## 2017-03-18 ENCOUNTER — Inpatient Hospital Stay (HOSPITAL_COMMUNITY): Payer: BLUE CROSS/BLUE SHIELD | Admitting: Occupational Therapy

## 2017-03-18 DIAGNOSIS — M7989 Other specified soft tissue disorders: Secondary | ICD-10-CM

## 2017-03-18 DIAGNOSIS — J9620 Acute and chronic respiratory failure, unspecified whether with hypoxia or hypercapnia: Secondary | ICD-10-CM

## 2017-03-18 LAB — CBC WITH DIFFERENTIAL/PLATELET
BASOS PCT: 0 %
Basophils Absolute: 0 10*3/uL (ref 0.0–0.1)
EOS ABS: 0.2 10*3/uL (ref 0.0–0.7)
EOS PCT: 2 %
HCT: 41.1 % (ref 39.0–52.0)
Hemoglobin: 13 g/dL (ref 13.0–17.0)
Lymphocytes Relative: 34 %
Lymphs Abs: 2.9 10*3/uL (ref 0.7–4.0)
MCH: 30.6 pg (ref 26.0–34.0)
MCHC: 31.6 g/dL (ref 30.0–36.0)
MCV: 96.7 fL (ref 78.0–100.0)
MONO ABS: 0.7 10*3/uL (ref 0.1–1.0)
Monocytes Relative: 9 %
Neutro Abs: 4.7 10*3/uL (ref 1.7–7.7)
Neutrophils Relative %: 55 %
Platelets: 211 10*3/uL (ref 150–400)
RBC: 4.25 MIL/uL (ref 4.22–5.81)
RDW: 14.2 % (ref 11.5–15.5)
WBC: 8.4 10*3/uL (ref 4.0–10.5)

## 2017-03-18 LAB — URINALYSIS, ROUTINE W REFLEX MICROSCOPIC
Bilirubin Urine: NEGATIVE
Glucose, UA: NEGATIVE mg/dL
Hgb urine dipstick: NEGATIVE
Ketones, ur: NEGATIVE mg/dL
LEUKOCYTES UA: NEGATIVE
NITRITE: NEGATIVE
PH: 5 (ref 5.0–8.0)
Protein, ur: NEGATIVE mg/dL
SPECIFIC GRAVITY, URINE: 1.021 (ref 1.005–1.030)

## 2017-03-18 LAB — COMPREHENSIVE METABOLIC PANEL
ALBUMIN: 3 g/dL — AB (ref 3.5–5.0)
ALT: 52 U/L (ref 17–63)
AST: 29 U/L (ref 15–41)
Alkaline Phosphatase: 86 U/L (ref 38–126)
Anion gap: 9 (ref 5–15)
BILIRUBIN TOTAL: 0.8 mg/dL (ref 0.3–1.2)
BUN: 14 mg/dL (ref 6–20)
CO2: 23 mmol/L (ref 22–32)
Calcium: 8.5 mg/dL — ABNORMAL LOW (ref 8.9–10.3)
Chloride: 108 mmol/L (ref 101–111)
Creatinine, Ser: 1.23 mg/dL (ref 0.61–1.24)
GFR calc non Af Amer: >60 mL/min (ref >60)
Glucose, Bld: 165 mg/dL — ABNORMAL HIGH (ref 65–99)
POTASSIUM: 3.3 mmol/L — AB (ref 3.5–5.1)
SODIUM: 140 mmol/L (ref 135–145)
TOTAL PROTEIN: 7.3 g/dL (ref 6.5–8.1)

## 2017-03-18 LAB — GLUCOSE, CAPILLARY
GLUCOSE-CAPILLARY: 129 mg/dL — AB (ref 65–99)
GLUCOSE-CAPILLARY: 130 mg/dL — AB (ref 65–99)
GLUCOSE-CAPILLARY: 159 mg/dL — AB (ref 65–99)

## 2017-03-18 NOTE — Care Management Note (Signed)
Inpatient Rehabilitation Center Individual Statement of Services  Patient Name:  Luis [name]  Date:  03/18/2017  Welcome to the Inpatient Rehabilitation Center.  Our goal is to provide you with an individualized program based on your diagnosis and situation, designed to meet your specific needs.  With this comprehensive rehabilitation program, you will be expected to participate in at least 3 hours of rehabilitation therapies Monday-Friday, with modified therapy programming on the weekends.  Your rehabilitation program will include the following services:  Physical Therapy (PT), Occupational Therapy (OT), Speech Therapy (ST), 24 hour per day rehabilitation nursing, Therapeutic Recreaction (TR), Neuropsychology, Case Management (Social Worker), Rehabilitation Medicine, Nutrition Services and Pharmacy Services  Weekly team conferences will be held on Wednesday to discuss your progress.  Your Social Worker will talk with you frequently to get your input and to update you on team discussions.  Team conferences with you and your family in attendance may also be held.  Expected length of stay: 7-10 days Overall anticipated outcome: mod/i level  Depending on your progress and recovery, your program may change. Your Social Worker will coordinate services and will keep you informed of any changes. Your Social Worker's name and contact numbers are listed  below.  The following services may also be recommended but are not provided by the Inpatient Rehabilitation Center:   Driving Evaluations  Home Health Rehabiltiation Services  Outpatient Rehabilitation Services  Vocational Rehabilitation   Arrangements will be made to provide these services after discharge if needed.  Arrangements include referral to agencies that provide these services.  Your insurance has been verified to be:  BCBS Your primary doctor is:  Lerry Liner  Pertinent information will be shared with your doctor and your  insurance company.  Social Worker:  Dossie Der, SW [phone] or ([phone]  Information discussed with and copy given to patient by: Lucy Chris, 03/18/2017, 12:40 PM

## 2017-03-18 NOTE — Progress Notes (Signed)
Social Work Assessment and Plan Social Work Assessment and Plan  Patient Details  Name: Luis [name] MRN: [id number] Date of Birth: 13-Aug-1966  Today's Date: 03/18/2017  Problem List:  Patient Active Problem List   Diagnosis Date Noted  . Debility 03/17/2017  . Respiratory failure (HCC) 03/17/2017  . NICM (nonischemic cardiomyopathy) (HCC)   . Morbid obesity (HCC)   . Tobacco abuse   . Diabetes mellitus type 2 in obese (HCC)   . Benign essential HTN   . Noncompliance   . Tachycardia   . Tachypnea   . Ileus (HCC)   . Acute renal failure (HCC)   . Bilateral pleural effusion   . Influenza A   . HAP (hospital-acquired pneumonia)   . Abdominal distention   . VAP (ventilator-associated pneumonia) (HCC)   . Endotracheally intubated   . Acute on chronic systolic congestive heart failure (HCC)   . Flash pulmonary edema (HCC)   . Acute respiratory failure (HCC)   . Acute heart failure (HCC) 03/01/2017  . Blurry vision, bilateral   . Blurry vision   . Atrial fibrillation (HCC)   . Abnormal nuclear stress test 03/24/2016  . Diastolic congestive heart failure (HCC)   . T wave inversion in EKG   . CHF (congestive heart failure) (HCC)   . Cough   . Dyspnea   . Sepsis (HCC)   . SOB (shortness of breath)   . Tachycardia   . Tachypnea   . Essential hypertension 03/19/2016  . SIRS (systemic inflammatory response syndrome) (HCC) 03/19/2016  . Elevated troponin 03/19/2016  . A-fib (HCC) 03/19/2016  . CAP (community acquired pneumonia) 03/19/2016   Past Medical History:  Past Medical History:  Diagnosis Date  . Atrial fibrillation (HCC)   . CHF (congestive heart failure) (HCC)   . DM (diabetes mellitus) (HCC)   . Essential hypertension   . High cholesterol   . History of tobacco abuse   . HTN (hypertension)   . Hyperlipidemia   . Morbid obesity (HCC)   . Obesity   . OSA (obstructive sleep apnea)   . Pneumonia ~ 12/2015; 03/19/2016   Past Surgical History:  Past Surgical  History:  Procedure Laterality Date  . CARDIAC CATHETERIZATION N/A 03/24/2016   Procedure: Left Heart Cath and Coronary Angiography;  Surgeon: Marykay Lex, MD;  Location: [facility] INVASIVE CV LAB;  Service: Cardiovascular;  Laterality: N/A;  . FEMUR IM NAIL Right 2012  . FRACTURE SURGERY     Social History:  reports that he quit smoking about 8 years ago. His smoking use included Cigarettes. He has a 15.00 pack-year smoking history. He has never used smokeless tobacco. He reports that he does not drink alcohol or use drugs.  Family / Support Systems Marital Status: Single Patient Roles: Parent, Other (Comment) (employee) Children: Son who stays in [place]-grandchildren's father Other Supports: Liam Graham [phone] Anticipated Caregiver: Mom Ability/Limitations of Caregiver: Mom can not provide much physical care, hopefully will be supervision level Caregiver Availability: 24/7 Family Dynamics: Close knit with Mom, sister and grandchildren they are raising. Pt realizes he has been through a lot and almost died but is on the road to recovery and he is so glad for this. He has a sister in [place] who has been down here and who provides support.  Social History Preferred language: English Religion: Non-Denominational Cultural Background: No issues Education: High School Read: Yes Write: Yes Employment Status: Employed Name of Employer: [employer]-from home Return to Work Plans: Plans to  return to this job when able Legal Hisotry/Current Legal Issues: No issues Guardian/Conservator: None-according to MD pt is capable of making his own decisions while here.    Abuse/Neglect Physical Abuse: Denies Verbal Abuse: Denies Sexual Abuse: Denies Exploitation of patient/patient's resources: Denies Self-Neglect: Denies  Emotional Status Pt's affect, behavior adn adjustment status: Pt is motivated to recover from this health scare and get back home. He realizes it has been hard on  his Mom trying to care for the grandchildren and come back and forth to see him. He has always been independent and wants to get back to this level. Recent Psychosocial Issues: had health issues but felt he was managing them until he got sick with this Pyschiatric History: No history-deferred depression screen at this time due to pt is coping appropriately and able to express his feelings and concerns. May benefit from seeing neuro-psych while here due to hospitalization and for coping. Substance Abuse History: Tobacco plans to quit now has gone this long with out it  Patient / Family Perceptions, Expectations & Goals Pt/Family understanding of illness & functional limitations: Pt and Mom can explain his condition and treatment plan. He is gald to be her eon rehab and ready to participate in therapies. He realizes he is needing to get stronger to be able to go home. He and Mom at times question the MD and they realize they do not like this.  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, Actor, employee, sibling, caregiver, etc Anticipated changes in roles/activities/participation: resume Pt/family expectations/goals: Pt states: " I want to be able to take care of myself before I leave here, especially getting up my steps."  Mom states: " I want him home soon."  [employer]: None Premorbid Home Care/DME Agencies: None Transportation available at discharge: Mom Resource referrals recommended: Neuropsychology, Support group (specify)  Discharge Planning Living Arrangements: Parent, Other relatives Support Systems: Parent, Other relatives, Friends/neighbors, Church/faith community Type of Residence: Private residence Insurance Resources: Media planner (specify) Herbalist) Financial Resources: Employment, Garment/textile technologist Screen Referred: No Living Expenses: Psychologist, sport and exercise Management: Patient, Family Does the patient have any problems obtaining your  medications?: No Home Management: Patient & Mom Patient/Family Preliminary Plans: Return home with Mom and grandchildren ( ages 42,6,8). He and Mom care for them. Pt needs to be mod/i due to Mom has health issues and can not provide physical care. Will await team evaluations and work on a safe discharge plan. Pt's main issue is his deconditioning and working on getting up stairs into apartment.  Social Work Anticipated Follow Up Needs: HH/OP, Support Group  Clinical Impression Pleasant motivated gentleman who is willing to work hard to get home as soon as possible. His Mom is very supportive and willing to assist him. Both he and Mom take care of pt's three young grandchildren. Pt's main issue seems to be lack of strength, need to work on this along with stairs since he has six to get into apartment. Will work on a safe discharge plan.  Lucy Chris 03/18/2017, 12:38 PM

## 2017-03-18 NOTE — Patient Care Conference (Signed)
Inpatient RehabilitationTeam Conference and Plan of Care Update Date: 03/18/2017   Time: 10:45 AM    Patient Name: Adar Rase      Medical Record Number: [id number]  Date of Birth: 1966-09-04 Sex: Male         Room/Bed: 4W23C/4W23C-01 Payor Info: Payor: BLUE CROSS BLUE SHIELD / Plan: BCBS OTHER / Product Type: *No Product type* /    Admitting Diagnosis: Debility  Admit Date/Time:  03/17/2017  6:29 PM Admission Comments: No comment available   Primary Diagnosis:  <principal problem not specified> Principal Problem: <principal problem not specified>  Patient Active Problem List   Diagnosis Date Noted  . Debility 03/17/2017  . Respiratory failure (HCC) 03/17/2017  . NICM (nonischemic cardiomyopathy) (HCC)   . Morbid obesity (HCC)   . Tobacco abuse   . Diabetes mellitus type 2 in obese (HCC)   . Benign essential HTN   . Noncompliance   . Tachycardia   . Tachypnea   . Ileus (HCC)   . Acute renal failure (HCC)   . Bilateral pleural effusion   . Influenza A   . HAP (hospital-acquired pneumonia)   . Abdominal distention   . VAP (ventilator-associated pneumonia) (HCC)   . Endotracheally intubated   . Acute on chronic systolic congestive heart failure (HCC)   . Flash pulmonary edema (HCC)   . Acute respiratory failure (HCC)   . Acute heart failure (HCC) 03/01/2017  . Blurry vision, bilateral   . Blurry vision   . Atrial fibrillation (HCC)   . Abnormal nuclear stress test 03/24/2016  . Diastolic congestive heart failure (HCC)   . T wave inversion in EKG   . CHF (congestive heart failure) (HCC)   . Cough   . Dyspnea   . Sepsis (HCC)   . SOB (shortness of breath)   . Tachycardia   . Tachypnea   . Essential hypertension 03/19/2016  . SIRS (systemic inflammatory response syndrome) (HCC) 03/19/2016  . Elevated troponin 03/19/2016  . A-fib (HCC) 03/19/2016  . CAP (community acquired pneumonia) 03/19/2016    Expected Discharge Date:    Team Members Present: Physician  leading conference: Dr. Claudette Laws Social Worker Present: Dossie Der, LCSW Nurse Present: Ronny Bacon, RN PT Present: Harless Litten, PTA;Grier Rocher, PT OT Present: Johnsie Cancel, OT SLP Present: Reuel Derby, SLP PPS Coordinator present : Tora Duck, RN, CRRN     Current Status/Progress Goal Weekly Team Focus  Medical     adjusting medicines   medical stability     Bowel/Bladder   continent B/B; LBM 03/17/17  maintain current continence  encourage use of call light for toileting needs; offer toileting q2hr and prn   Swallow/Nutrition/ Hydration             ADL's     eval pending        Mobility     eval pending        Communication             Safety/Cognition/ Behavioral Observations            Pain   denies pain  less than 3 on 0-10 scale  assess for pain q shift and PRN   Skin   clean, dry, intact  maintain skin free of breakdown and infection  assess skin q shift and PRN for changes      *See Care Plan and progress notes for long and short-term goals.  Barriers to Discharge:   deconditioning and strengthening  Possible Resolutions to Barriers:       Discharge Planning/Teaching Needs:    Home with Mom and grandchildren, needs to be atleast supervision level Mom unable to provide physical care.     Team Discussion:  New eval, OT feels can reach mod/i level in 7-10 days. PT and SP have yet to evaluate. Medically adjusting medicines.  Revisions to Treatment Plan:  New eval      Lucy Chris 03/18/2017, 1:48 PM

## 2017-03-18 NOTE — Progress Notes (Signed)
Subjective/Complaints:   Objective: Vital Signs: Blood pressure (!) 165/77, pulse 72, temperature 98.5 F (36.9 C), temperature source Oral, resp. rate (!) 22, height '6\' 3"'$  (1.905 m), weight (!) 156.2 kg (344 lb 7.1 oz), SpO2 93 %. No results found. Results for orders placed or performed during the hospital encounter of 03/17/17 (from the past 72 hour(s))  CBC     Status: None   Collection Time: 03/17/17  8:37 PM  Result Value Ref Range   WBC 8.9 4.0 - 10.5 K/uL   RBC 4.24 4.22 - 5.81 MIL/uL   Hemoglobin 13.3 13.0 - 17.0 g/dL   HCT 40.9 39.0 - 52.0 %   MCV 96.5 78.0 - 100.0 fL   MCH 31.4 26.0 - 34.0 pg   MCHC 32.5 30.0 - 36.0 g/dL   RDW 14.6 11.5 - 15.5 %   Platelets 184 150 - 400 K/uL  Creatinine, serum     Status: Abnormal   Collection Time: 03/17/17  8:37 PM  Result Value Ref Range   Creatinine, Ser 1.26 (H) 0.61 - 1.24 mg/dL   GFR calc non Af Amer >60 >60 mL/min   GFR calc Af Amer >60 >60 mL/min    Comment: (NOTE) The eGFR has been calculated using the CKD EPI equation. This calculation has not been validated in all clinical situations. eGFR's persistently <60 mL/min signify possible Chronic Kidney Disease.   Glucose, capillary     Status: Abnormal   Collection Time: 03/17/17  9:06 PM  Result Value Ref Range   Glucose-Capillary 130 (H) 65 - 99 mg/dL   Comment 1 Notify RN    Comment 2 Document in Chart   Urinalysis, Routine w reflex microscopic     Status: None   Collection Time: 03/18/17  2:00 AM  Result Value Ref Range   Color, Urine YELLOW YELLOW   APPearance CLEAR CLEAR   Specific Gravity, Urine 1.021 1.005 - 1.030   pH 5.0 5.0 - 8.0   Glucose, UA NEGATIVE NEGATIVE mg/dL   Hgb urine dipstick NEGATIVE NEGATIVE   Bilirubin Urine NEGATIVE NEGATIVE   Ketones, ur NEGATIVE NEGATIVE mg/dL   Protein, ur NEGATIVE NEGATIVE mg/dL   Nitrite NEGATIVE NEGATIVE   Leukocytes, UA NEGATIVE NEGATIVE  Glucose, capillary     Status: Abnormal   Collection Time: 03/18/17   6:18 AM  Result Value Ref Range   Glucose-Capillary 129 (H) 65 - 99 mg/dL     HEENT: normal Cardio: RRR and no murmur Resp: CTA B/L and unlabored, reduced BS in bases GI: BS positive and NT, ND Extremity:  Pulses positive and No Edema Skin:   Intact Neuro: Alert/Oriented and Abnormal Motor 4/5 In BUE and BLE Musc/Skel:  Other no pain with UE or LE ROM Gen NAD   Assessment/Plan: 1. Functional deficits secondary to Debility due to respiratory failure which require 3+ hours per day of interdisciplinary therapy in a comprehensive inpatient rehab setting. Physiatrist is providing close team supervision and 24 hour management of active medical problems listed below. Physiatrist and rehab team continue to assess barriers to discharge/monitor patient progress toward functional and medical goals. FIM:                                  Medical Problem List and Plan: 1. Decreased functional mobility with debilitationsecondary to acute hypoxic respiratory failure/Acute encephalopathy -PT, OT, SLP evals 2. DVT Prophylaxis/Anticoagulation: SQ heparin.Check vascular study 3. Pain Management:  Tylenol as needed 4. Mood: Provide emotional support 5. Neuropsych: This patient iscapable of making decisions on hisown behalf. 6. Skin/Wound Care: Routine skin checks 7. Fluids/Electrolytes/Nutrition: Routine I&O with follow up chemestries 8.Morbid obesity. BMI 45.9 KG. Dietary follow-up 9.Diabetes mellitus with peripheral neuropathy.Hemoglobin A1c 8.2. Lantus insulin 10 units daily.Check blood sugars before meals and at bedtime CBG (last 3)   Recent Labs  03/17/17 1643 03/17/17 2106 03/18/17 0618  GLUCAP 161* 130* 129*    10.COPD. Continue nebulizer treatments check oxygen saturations every shift -continue on 2L oxygen via [place] for now 11.Hypertension.Lopressor 37.5 mg twice a day.Monitor with increased mobility 12.PAF.Cardiac rate  controlled. 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 Monitor for any signs of fluid overload 14.Hypokalemia. Follow-up chemistries 15.AKI.Creatinine stabilized at 1.28. Follow-up chemistries 16.Medical noncompliance. Counseling  LOS (Days) 1 A FACE TO FACE EVALUATION WAS PERFORMED  KIRSTEINS,ANDREW E 03/18/2017, 8:39 AM

## 2017-03-18 NOTE — Progress Notes (Signed)
*  Preliminary Results* Bilateral lower extremity venous duplex completed. There is no obvious evidence of lower extremity deep vein thrombosis or Baker's cyst bilaterally.  03/18/2017 2:12 PM Gertie Fey, BS, RVT, RDCS, RDMS

## 2017-03-18 NOTE — Progress Notes (Signed)
Patient information reviewed and entered into eRehab system by Alecxis Baltzell, RN, CRRN, PPS Coordinator.  Information including medical coding and functional independence measure will be reviewed and updated through discharge.    

## 2017-03-18 NOTE — Evaluation (Signed)
Occupational Therapy Assessment and Plan  Patient Details  Name: Luis [name] MRN: [id number] Date of Birth: December 05, 1965  OT Diagnosis: muscle weakness (generalized) and swelling of limb Rehab Potential: Rehab Potential (ACUTE ONLY): Excellent ELOS: 7-10 days   Today's Date: 03/18/2017 OT Individual Time: 0017-4944 OT Individual Time Calculation (min): 75 min     Problem List:  Patient Active Problem List   Diagnosis Date Noted  . Debility 03/17/2017  . Respiratory failure ([place]) 03/17/2017  . NICM (nonischemic cardiomyopathy) ([place])   . Morbid obesity ([place])   . Tobacco abuse   . Diabetes mellitus type 2 in obese ([place])   . Benign essential HTN   . Noncompliance   . Tachycardia   . Tachypnea   . Ileus ([place])   . Acute renal failure ([place])   . Bilateral pleural effusion   . Influenza A   . HAP (hospital-acquired pneumonia)   . Abdominal distention   . VAP (ventilator-associated pneumonia) ([place])   . Endotracheally intubated   . Acute on chronic systolic congestive heart failure ([place])   . Flash pulmonary edema ([place])   . Acute respiratory failure ([place])   . Acute heart failure ([place]) 03/01/2017  . Blurry vision, bilateral   . Blurry vision   . Atrial fibrillation ([place])   . Abnormal nuclear stress test 03/24/2016  . Diastolic congestive heart failure ([place])   . T wave inversion in EKG   . CHF (congestive heart failure) ([place])   . Cough   . Dyspnea   . Sepsis ([place])   . SOB (shortness of breath)   . Tachycardia   . Tachypnea   . Essential hypertension 03/19/2016  . SIRS (systemic inflammatory response syndrome) ([place]) 03/19/2016  . Elevated troponin 03/19/2016  . A-fib ([place]) 03/19/2016  . CAP (community acquired pneumonia) 03/19/2016    Past Medical History:  Past Medical History:  Diagnosis Date  . Atrial fibrillation ([place])   . CHF (congestive heart failure) ([place])   . DM (diabetes mellitus) ([place])   . Essential hypertension   . High cholesterol   . History of tobacco  abuse   . HTN (hypertension)   . Hyperlipidemia   . Morbid obesity ([place])   . Obesity   . OSA (obstructive sleep apnea)   . Pneumonia ~ 12/2015; 03/19/2016   Past Surgical History:  Past Surgical History:  Procedure Laterality Date  . CARDIAC CATHETERIZATION N/A 03/24/2016   Procedure: Left Heart Cath and Coronary Angiography;  Surgeon: Leonie Man, MD;  Location: [facility];  Service: Cardiovascular;  Laterality: N/A;  . FEMUR IM NAIL Right 2012  . FRACTURE SURGERY      Assessment & Plan Clinical Impression: Chigozie Basaldua a 51 y.o.right handed malewith history of COPD with tobacco abuse,NICM,atrial fibrillation, 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 Noncompliant with medications over the past several months. Patient reported to be on no prescription medications at time of admission. Per chart review patient independent prior to admission living alone. One level home with 6 steps to entry. Works as Barista.Family in the area plans to provideassistance on discharge.Presented 03/01/2017 with productive cough, myalgias as well as increased lower extremity edema. Oxygen saturation 84% on room air placed on CPAP. Chest x-ray reviewed, showing b/l infiltartes. Per report, diffuse patchy bilateral infiltrates as well as cardiomegaly concerning for pulmonary edema. Hyponatremia 132, BNP 243, troponin negative, urine drug screen negative. Patient required intubation and  placed on broad-spectrum antibiotics. Maintain on IV diuresis. Echocardiogram with ejection fraction of 55% no wall motion abnormality grade 1 diastolic dysfunction. Antibiotic therapy course has been completed for influenza A pneumonia. Remainedintubated through 03/12/2017.AKI2.06 improved with gentle hydration and latest creatinine 1.28.Demonstrating some impaired memory and awareness deficits subcutaneous heparin for DVT  prophylaxis. Diet has been advanced to a regular consistency. Physical and occupational therapy evaluations completed with recommendations of physical medicine rehabilitation consult.Patient was admitted for a comprehensive rehab program Patient transferred to CIR on 03/17/2017 .    Patient currently requires mod with basic self-care skills secondary to muscle weakness, decreased cardiorespiratoy endurance and decreased standing balance and decreased postural control.  Prior to hospitalization, patient could complete ADLs/IADLs with independent .  Patient will benefit from skilled intervention to decrease level of assist with basic self-care skills, increase independence with basic self-care skills and increase level of independence with iADL prior to discharge home with care partner.  Anticipate patient will require intermittent supervision and follow up home health.  OT - End of Session Activity Tolerance: Tolerates 10 - 20 min activity with multiple rests Endurance Deficit: Yes Endurance Deficit Description: Required rest breaks throughout bathing/dressing session and following stand pivot transfers OT Assessment Rehab Potential (ACUTE ONLY): Excellent OT Patient demonstrates impairments in the following area(s): Balance;Endurance OT Basic ADL's Functional Problem(s): Grooming;Bathing;Dressing;Toileting OT Advanced ADL's Functional Problem(s): Simple Meal Preparation;Laundry OT Transfers Functional Problem(s): Toilet;Tub/Shower OT Additional Impairment(s): None OT Plan OT Intensity: Minimum of 1-2 x/day, 45 to 90 minutes OT Frequency: 5 out of 7 days OT Duration/Estimated Length of Stay: 7-10 days OT Treatment/Interventions: Balance/vestibular training;Discharge planning;Community reintegration;DME/adaptive equipment instruction;Functional mobility training;Patient/family education;Psychosocial support;Self Care/advanced ADL retraining;Therapeutic Activities;Therapeutic Exercise;UE/LE Strength  taining/ROM;UE/LE Coordination activities OT Self Feeding Anticipated Outcome(s): Mod I OT Basic Self-Care Anticipated Outcome(s): Mod I OT Toileting Anticipated Outcome(s): Mod I OT Bathroom Transfers Anticipated Outcome(s): Mod I OT Recommendation Patient destination: Home Follow Up Recommendations: Home health OT Equipment Recommended: Tub/shower bench   Skilled Therapeutic Intervention Pt seen for OT Eval and ADL bathing/dressing session. Pt in supine upon arrival, desiring to get up and denying pain. He transferred to EOB with min A using hispital bed functions. He ate breakfast seated EOB mod I while therapist gathered heavy duty w/c and bathroom DME. HE completed stand pivot transfers throughout session with min A using RW and VCs for hand placement on RW. Pt fatigued following each stand pivot requiring seated rest breaks throughout session following mobility. HE bathed seated on 3-1 BSC and dressed seated in w/c, steadying assist required for standing portions and assist for bathing/dressing feet. He completed stand pivot transfer to toilet with heavy reliance on grab bars, assist required for buttock hygiene due to decreased balance.  Pt required seated rest breaks throughout session due to decreased functional activity tolerance/ endurance, O2 stats remained >98% throughout session on RA. Pt left seated in w/c at end of session with sister present. Made aware of need for assist with all mobility and use of call bell. Educated throughout session regarding role of OT, POC, CIR, OT goals, and d/c planning.   OT Evaluation Precautions/Restrictions  Precautions Precautions: Fall Restrictions Weight Bearing Restrictions: No General Chart Reviewed: Yes Pain   No/ denies pain Home Living/Prior Functioning Home Living Family/patient expects to be discharged to:: Private residence Living Arrangements: Parent, Other relatives Available Help at Discharge: Family, Available 24  hours/day Type of Home: Apartment Home Access: Stairs to enter [employer] of Steps: 6 Entrance Stairs-Rails: Right, Left,  Can reach both Home Layout: One level Bathroom Shower/Tub: Optometrist: Yes  Lives With: Family (Mother and  3 grandchildren) IADL History Homemaking Responsibilities: Yes Meal Prep Responsibility: Primary Current License: No Education: Some college Occupation: Full time employment Type of Occupation: Works from home for [employer] Prior Function Level of Independence: Independent with basic ADLs, Independent with homemaking with ambulation, Independent with gait, Independent with transfers  Able to [place]?: Yes Driving: No Vocation: Full time employment 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 [employer] Leisure: Hobbies-yes (Comment) Comments: Sports, grandchildren Vision Baseline Vision/History: Wears glasses Wears Glasses: At all times Patient Visual Report: No change from baseline Vision Assessment?: No apparent visual deficits Perception  Perception: Within Functional Limits Praxis Praxis: Intact Cognition Overall Cognitive Status: Within Functional Limits for tasks assessed Arousal/Alertness: Awake/alert Orientation Level: Person;Place;Situation Person: Oriented Place: Oriented Situation: Oriented Year: 2018 Month: May Day of Week: Correct Memory: Appears intact Immediate Memory Recall: Sock;Blue;Bed Memory Recall: Sock;Blue Memory Recall Sock: Without Cue Memory Recall Blue: Without Cue Awareness: Appears intact Problem Solving: Appears intact Safety/Judgment: Appears intact Sensation Sensation Light Touch: Appears Intact Proprioception: Appears Intact Coordination Gross Motor Movements are Fluid and Coordinated: Yes Fine Motor Movements are Fluid and Coordinated: Yes Finger Nose Finger Test: delayed accuracy onthe L compared to the R.  Motor  Motor Motor:  Other (comment) Motor - Skilled Clinical Observations: Generalized weakness and deconditationing.  Trunk/Postural Assessment  Cervical Assessment Cervical Assessment: Within Functional Limits Thoracic Assessment Thoracic Assessment: Exceptions to [facility] (Rounded shoulders) Lumbar Assessment Lumbar Assessment: Exceptions to [facility] (Posterior pelvic tilt) Postural Control Postural Control: Deficits on evaluation (Due to generalized weakness)  Balance Balance Balance Assessed: Yes Dynamic Sitting Balance Dynamic Sitting - Balance Support: During functional activity;Feet supported Dynamic Sitting - Level of Assistance: 6: Modified independent (Device/Increase time) Sitting balance - Comments: Sitting EOB to eat breakfast Static Standing Balance Static Standing - Balance Support: During functional activity;Right upper extremity supported;Left upper extremity supported Static Standing - Level of Assistance: 5: Stand by assistance;4: Min assist Static Standing - Comment/# of Minutes: Standing at sink to complete grooming task Dynamic Standing Balance Dynamic Standing - Balance Support: During functional activity;Right upper extremity supported;Left upper extremity supported Dynamic Standing - Level of Assistance: 4: Min assist Dynamic Standing - Comments: Standing to complete LB bathing/dressing Extremity/Trunk Assessment RUE Assessment RUE Assessment: Within Functional Limits LUE Assessment LUE Assessment: Within Functional Limits   See Function Navigator for Current Functional Status.   Refer to Care Plan for Long Term Goals  Recommendations for other services: Therapeutic Recreation  Outing/community reintegration   Discharge Criteria: Patient will be discharged from OT if patient refuses treatment 3 consecutive times without medical reason, if treatment goals not met, if there is a change in medical status, if patient makes no progress towards goals or if patient is discharged from  hospital.  The above assessment, treatment plan, treatment alternatives and goals were discussed and mutually agreed upon: by patient and by family  Ernestina Patches 03/18/2017, 12:42 PM

## 2017-03-19 ENCOUNTER — Inpatient Hospital Stay (HOSPITAL_COMMUNITY): Payer: BLUE CROSS/BLUE SHIELD | Admitting: Occupational Therapy

## 2017-03-19 ENCOUNTER — Inpatient Hospital Stay (HOSPITAL_COMMUNITY): Payer: BLUE CROSS/BLUE SHIELD | Admitting: Physical Therapy

## 2017-03-19 DIAGNOSIS — I5030 Unspecified diastolic (congestive) heart failure: Secondary | ICD-10-CM

## 2017-03-19 LAB — URINE CULTURE: Culture: NO GROWTH

## 2017-03-19 LAB — GLUCOSE, CAPILLARY
GLUCOSE-CAPILLARY: 154 mg/dL — AB (ref 65–99)
GLUCOSE-CAPILLARY: 127 mg/dL — AB (ref 65–99)
Glucose-Capillary: 124 mg/dL — ABNORMAL HIGH (ref 65–99)
Glucose-Capillary: 112 mg/dL — ABNORMAL HIGH (ref 65–99)
Glucose-Capillary: 146 mg/dL — ABNORMAL HIGH (ref 65–99)

## 2017-03-19 MED ORDER — HYDROCERIN EX CREA
TOPICAL_CREAM | Freq: Two times a day (BID) | CUTANEOUS | Status: DC
  Administered 2017-03-19 – 2017-03-23 (×8): via TOPICAL
  Filled 2017-03-19: qty 113

## 2017-03-19 MED ORDER — IPRATROPIUM-ALBUTEROL 0.5-2.5 (3) MG/3ML IN SOLN: 3.0000 mL | RESPIRATORY_TRACT | Status: DC | PRN

## 2017-03-19 MED ORDER — POTASSIUM CHLORIDE CRYS ER 10 MEQ PO TBCR
10.0000 meq | EXTENDED_RELEASE_TABLET | Freq: Every day | ORAL | Status: DC
  Administered 2017-03-19 – 2017-03-23 (×5): 10 meq via ORAL
  Filled 2017-03-19 (×5): qty 1

## 2017-03-19 NOTE — Progress Notes (Signed)
Subjective/Complaints:  No issues except freq BMs, pt concerned he is not wiping independantly   ROS- neg N/V/D, neg CP or SOB  Objective: Vital Signs: Blood pressure 128/62, pulse 93, temperature 98.9 F (37.2 C), temperature source Oral, resp. rate 20, height '6\' 3"'$  (1.905 m), weight (!) 156.3 kg (344 lb 11 oz), SpO2 96 %. No results found. Results for orders placed or performed during the hospital encounter of 03/17/17 (from the past 72 hour(s))  CBC     Status: None   Collection Time: 03/17/17  8:37 PM  Result Value Ref Range   WBC 8.9 4.0 - 10.5 K/uL   RBC 4.24 4.22 - 5.81 MIL/uL   Hemoglobin 13.3 13.0 - 17.0 g/dL   HCT 40.9 39.0 - 52.0 %   MCV 96.5 78.0 - 100.0 fL   MCH 31.4 26.0 - 34.0 pg   MCHC 32.5 30.0 - 36.0 g/dL   RDW 14.6 11.5 - 15.5 %   Platelets 184 150 - 400 K/uL  Creatinine, serum     Status: Abnormal   Collection Time: 03/17/17  8:37 PM  Result Value Ref Range   Creatinine, Ser 1.26 (H) 0.61 - 1.24 mg/dL   GFR calc non Af Amer >60 >60 mL/min   GFR calc Af Amer >60 >60 mL/min    Comment: (NOTE) The eGFR has been calculated using the CKD EPI equation. This calculation has not been validated in all clinical situations. eGFR's persistently <60 mL/min signify possible Chronic Kidney Disease.   Glucose, capillary     Status: Abnormal   Collection Time: 03/17/17  9:06 PM  Result Value Ref Range   Glucose-Capillary 130 (H) 65 - 99 mg/dL   Comment 1 Notify RN    Comment 2 Document in Chart   Urinalysis, Routine w reflex microscopic     Status: None   Collection Time: 03/18/17  2:00 AM  Result Value Ref Range   Color, Urine YELLOW YELLOW   APPearance CLEAR CLEAR   Specific Gravity, Urine 1.021 1.005 - 1.030   pH 5.0 5.0 - 8.0   Glucose, UA NEGATIVE NEGATIVE mg/dL   Hgb urine dipstick NEGATIVE NEGATIVE   Bilirubin Urine NEGATIVE NEGATIVE   Ketones, ur NEGATIVE NEGATIVE mg/dL   Protein, ur NEGATIVE NEGATIVE mg/dL   Nitrite NEGATIVE NEGATIVE    Leukocytes, UA NEGATIVE NEGATIVE  Glucose, capillary     Status: Abnormal   Collection Time: 03/18/17  6:18 AM  Result Value Ref Range   Glucose-Capillary 129 (H) 65 - 99 mg/dL  CBC WITH DIFFERENTIAL     Status: None   Collection Time: 03/18/17 10:22 AM  Result Value Ref Range   WBC 8.4 4.0 - 10.5 K/uL   RBC 4.25 4.22 - 5.81 MIL/uL   Hemoglobin 13.0 13.0 - 17.0 g/dL   HCT 41.1 39.0 - 52.0 %   MCV 96.7 78.0 - 100.0 fL   MCH 30.6 26.0 - 34.0 pg   MCHC 31.6 30.0 - 36.0 g/dL   RDW 14.2 11.5 - 15.5 %   Platelets 211 150 - 400 K/uL   Neutrophils Relative % 55 %   Neutro Abs 4.7 1.7 - 7.7 K/uL   Lymphocytes Relative 34 %   Lymphs Abs 2.9 0.7 - 4.0 K/uL   Monocytes Relative 9 %   Monocytes Absolute 0.7 0.1 - 1.0 K/uL   Eosinophils Relative 2 %   Eosinophils Absolute 0.2 0.0 - 0.7 K/uL   Basophils Relative 0 %   Basophils Absolute 0.0  0.0 - 0.1 K/uL  Comprehensive metabolic panel     Status: Abnormal   Collection Time: 03/18/17 10:22 AM  Result Value Ref Range   Sodium 140 135 - 145 mmol/L   Potassium 3.3 (L) 3.5 - 5.1 mmol/L   Chloride 108 101 - 111 mmol/L   CO2 23 22 - 32 mmol/L   Glucose, Bld 165 (H) 65 - 99 mg/dL   BUN 14 6 - 20 mg/dL   Creatinine, Ser 1.23 0.61 - 1.24 mg/dL   Calcium 8.5 (L) 8.9 - 10.3 mg/dL   Total Protein 7.3 6.5 - 8.1 g/dL   Albumin 3.0 (L) 3.5 - 5.0 g/dL   AST 29 15 - 41 U/L   ALT 52 17 - 63 U/L   Alkaline Phosphatase 86 38 - 126 U/L   Total Bilirubin 0.8 0.3 - 1.2 mg/dL   GFR calc non Af Amer >60 >60 mL/min   GFR calc Af Amer >60 >60 mL/min    Comment: (NOTE) The eGFR has been calculated using the CKD EPI equation. This calculation has not been validated in all clinical situations. eGFR's persistently <60 mL/min signify possible Chronic Kidney Disease.    Anion gap 9 5 - 15  Glucose, capillary     Status: Abnormal   Collection Time: 03/18/17 12:05 PM  Result Value Ref Range   Glucose-Capillary 130 (H) 65 - 99 mg/dL   Comment 1 Notify RN    Glucose, capillary     Status: Abnormal   Collection Time: 03/18/17  5:09 PM  Result Value Ref Range   Glucose-Capillary 159 (H) 65 - 99 mg/dL  Glucose, capillary     Status: Abnormal   Collection Time: 03/19/17  6:30 AM  Result Value Ref Range   Glucose-Capillary 124 (H) 65 - 99 mg/dL     HEENT: normal Cardio: RRR and no murmur Resp: CTA B/L and unlabored, reduced BS in bases GI: BS positive and NT, ND Extremity:  Pulses positive and No Edema Skin:   Intact Neuro: Alert/Oriented and Abnormal Motor 4/5 In BUE and BLE Musc/Skel:  Other no pain with UE or LE ROM Gen NAD   Assessment/Plan: 1. Functional deficits secondary to Debility due to respiratory failure which require 3+ hours per day of interdisciplinary therapy in a comprehensive inpatient rehab setting. Physiatrist is providing close team supervision and 24 hour management of active medical problems listed below. Physiatrist and rehab team continue to assess barriers to discharge/monitor patient progress toward functional and medical goals. FIM: Function - Bathing Position: Shower Body parts bathed by patient: Right arm, Left arm, Right upper leg, Left upper leg, Chest, Abdomen, Front perineal area Body parts bathed by helper: Right lower leg, Left lower leg, Back, Buttocks Assist Level: Touching or steadying assistance(Pt > 75%)  Function- Upper Body Dressing/Undressing What is the patient wearing?: Pull over shirt/dress Pull over shirt/dress - Perfomed by patient: Thread/unthread right sleeve, Thread/unthread left sleeve, Put head through opening, Pull shirt over trunk Assist Level: Set up Set up : To obtain clothing/put away Function - Lower Body Dressing/Undressing What is the patient wearing?: Underwear, Pants, Socks, Shoes Position: Wheelchair/chair at sink Underwear - Performed by patient: Thread/unthread right underwear leg, Thread/unthread left underwear leg, Pull underwear up/down Pants- Performed by  patient: Thread/unthread right pants leg, Thread/unthread left pants leg, Pull pants up/down, Fasten/unfasten pants Socks - Performed by helper: Don/doff right sock, Don/doff left sock Shoes - Performed by helper: Don/doff right shoe, Don/doff left shoe, Fasten right, Fasten left Assist  for footwear: Maximal assist Assist for lower body dressing: Touching or steadying assistance (Pt > 75%)  Function - Toileting Toileting steps completed by patient: Adjust clothing prior to toileting, Adjust clothing after toileting Toileting steps completed by helper: Performs perineal hygiene Toileting Assistive Devices: Grab bar or rail Assist level: Touching or steadying assistance (Pt.75%)  Function - Toilet Transfers Toilet transfer assistive device: Elevated toilet seat/BSC over toilet, Grab bar Assist level to toilet: Moderate assist (Pt 50 - 74%/lift or lower) Assist level from toilet: Moderate assist (Pt 50 - 74%/lift or lower)  Function - Chair/bed transfer Chair/bed transfer method: Stand pivot Chair/bed transfer assist level: Touching or steadying assistance (Pt > 75%) Chair/bed transfer assistive device: Armrests, Walker  Function - Locomotion: Wheelchair Will patient use wheelchair at discharge?: No Type: Manual Max wheelchair distance: 179f  Assist Level: Supervision or verbal cues Assist Level: Supervision or verbal cues Wheel 150 feet activity did not occur: Safety/medical concerns Turns around,maneuvers to table,bed, and toilet,negotiates 3% grade,maneuvers on rugs and over doorsills: No Function - Locomotion: Ambulation Assistive device: Walker-rolling Max distance: 271f Assist level: Touching or steadying assistance (Pt > 75%) Assist level: Touching or steadying assistance (Pt > 75%) Walk 50 feet with 2 turns activity did not occur: Safety/medical concerns Walk 10 feet on uneven surfaces activity did not occur: Safety/medical concerns  Function -  Comprehension Comprehension: Auditory Comprehension assist level: Follows complex conversation/direction with extra time/assistive device  Function - Expression Expression: Verbal Expression assist level: Expresses complex ideas: With extra time/assistive device  Function - Social Interaction Social Interaction assist level: Interacts appropriately with others with medication or extra time (anti-anxiety, antidepressant).  Function - Problem Solving Problem solving assist level: Solves complex problems: With extra time  Function - Memory Memory assist level: More than reasonable amount of time  Medical Problem List and Plan: 1. Decreased functional mobility with debilitationsecondary to acute hypoxic respiratory failure/Acute encephalopathy -PT, OT, SLP cont CIR 2. DVT Prophylaxis/Anticoagulation: SQ heparin.Check vascular study 3. Pain Management: Tylenol as needed 4. Mood: Provide emotional support 5. Neuropsych: This patient iscapable of making decisions on hisown behalf. 6. Skin/Wound Care: Routine skin checks 7. Fluids/Electrolytes/Nutrition: Routine I&O with follow up chemestries 8.Morbid obesity. BMI 45.9 KG. Dietary follow-up 9.Diabetes mellitus with peripheral neuropathy.Hemoglobin A1c 8.2. Lantus insulin 10 units daily.Check blood sugars before meals and at bedtime, good control CBG (last 3)   Recent Labs  03/18/17 1205 03/18/17 1709 03/19/17 0630  GLUCAP 130* 159* 124*    10.COPD. Continue nebulizer treatments check oxygen saturations every shift -continue on 2L oxygen via [place] for now 11.Hypertension.Lopressor 37.5 mg twice a day.Monitor with increased mobility 12.PAF.Cardiac rate controlled. 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 Monitor for any signs of fluid overload 14.Hypokalemia. Follow-up K+ 3.3 , supplement 15.AKI.Creatinine stabilized. Follow-up chemistries creat  1.23 16.Medical noncompliance. Counseling  LOS (Days) 2 A FACE TO FACE EVALUATION WAS PERFORMED  Saarah Dewing E 03/19/2017, 8:21 AM

## 2017-03-19 NOTE — Evaluation (Signed)
Physical Therapy Assessment and Plan  Patient Details  Name: Luis [name] MRN: [id number] Date of Birth: 02-04-1966  PT Diagnosis: Difficulty walking and Muscle weakness Rehab Potential: Excellent ELOS: 7-10days    Today's Date: 03/18/2017 PT Individual Time:1100-1200 AND 1600-1700 Time Calculation: 60 min AND 60 min       Problem List:  Patient Active Problem List   Diagnosis Date Noted  . Debility 03/17/2017  . Respiratory failure ([place]) 03/17/2017  . NICM (nonischemic cardiomyopathy) ([place])   . Morbid obesity ([place])   . Tobacco abuse   . Diabetes mellitus type 2 in obese ([place])   . Benign essential HTN   . Noncompliance   . Tachycardia   . Tachypnea   . Ileus ([place])   . Acute renal failure ([place])   . Bilateral pleural effusion   . Influenza A   . HAP (hospital-acquired pneumonia)   . Abdominal distention   . VAP (ventilator-associated pneumonia) ([place])   . Endotracheally intubated   . Acute on chronic systolic congestive heart failure ([place])   . Flash pulmonary edema ([place])   . Acute respiratory failure ([place])   . Acute heart failure ([place]) 03/01/2017  . Blurry vision, bilateral   . Blurry vision   . Atrial fibrillation ([place])   . Abnormal nuclear stress test 03/24/2016  . Diastolic congestive heart failure ([place])   . T wave inversion in EKG   . CHF (congestive heart failure) ([place])   . Cough   . Dyspnea   . Sepsis ([place])   . SOB (shortness of breath)   . Tachycardia   . Tachypnea   . Essential hypertension 03/19/2016  . SIRS (systemic inflammatory response syndrome) ([place]) 03/19/2016  . Elevated troponin 03/19/2016  . A-fib ([place]) 03/19/2016  . CAP (community acquired pneumonia) 03/19/2016    Past Medical History:  Past Medical History:  Diagnosis Date  . Atrial fibrillation ([place])   . CHF (congestive heart failure) ([place])   . DM (diabetes mellitus) ([place])   . Essential hypertension   . High cholesterol   . History of tobacco abuse   . HTN (hypertension)   .  Hyperlipidemia   . Morbid obesity ([place])   . Obesity   . OSA (obstructive sleep apnea)   . Pneumonia ~ 12/2015; 03/19/2016   Past Surgical History:  Past Surgical History:  Procedure Laterality Date  . CARDIAC CATHETERIZATION N/A 03/24/2016   Procedure: Left Heart Cath and Coronary Angiography;  Surgeon: Leonie Man, MD;  Location: [facility];  Service: Cardiovascular;  Laterality: N/A;  . FEMUR IM NAIL Right 2012  . FRACTURE SURGERY      Assessment & Plan Clinical Impression: Patient is a 51 y.o.right handed malewith history of COPD with tobacco abuse,NICM,atrial fibrillation, 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 Noncompliant with medications over the past several months. Patient reported to be on no prescription medications at time of admission. Per chart review patient independent prior to admission living alone. One level home with 6 steps to entry. Works as Barista.Family in the area plans to provideassistance on discharge.Presented 03/01/2017 with productive cough, myalgias as well as increased lower extremity edema. Oxygen saturation 84% on room air placed on CPAP. Chest x-ray reviewed, showing b/l infiltartes. Per report, diffuse patchy bilateral infiltrates as well as cardiomegaly concerning for pulmonary edema. Hyponatremia 132, BNP 243, troponin negative, urine drug screen negative. Patient required intubation and placed on broad-spectrum  antibiotics. Maintain on IV diuresis. Echocardiogram with ejection fraction of 55% no wall motion abnormality grade 1 diastolic dysfunction. Antibiotic therapy course has been completed for influenza A pneumonia. Remainedintubated through 03/12/2017.AKI2.06 improved with gentle hydration and latest creatinine 1.28.Demonstrating some impaired memory and awareness deficits subcutaneous heparin for DVT prophylaxis.   Patient transferred to CIR on  03/17/2017 .   Patient currently requires min with mobility secondary to muscle weakness, decreased cardiorespiratoy endurance and decreased oxygen support and decreased standing balance and decreased balance strategies.  Prior to hospitalization, patient was independent  with mobility and lived with Family (Mother and  3 grandchildren) in a Apartment home.  Home access is 6Stairs to enter.  Patient will benefit from skilled PT intervention to maximize safe functional mobility, minimize fall risk and decrease caregiver burden for planned discharge home with intermittent assist.  Anticipate patient will benefit from follow up [facility] at discharge.  PT - End of Session Activity Tolerance: Tolerates 10 - 20 min activity with multiple rests Endurance Deficit: Yes PT Assessment Rehab Potential (ACUTE/IP ONLY): Excellent Barriers to Discharge: Inaccessible home environment PT Patient demonstrates impairments in the following area(s): Balance;Edema;Endurance;Safety PT Transfers Functional Problem(s): Bed Mobility;Bed to Chair;Car;Furniture;Floor PT Locomotion Functional Problem(s): Ambulation;Wheelchair Mobility;Stairs PT Plan PT Intensity: Minimum of 1-2 x/day ,45 to 90 minutes PT Frequency: 5 out of 7 days PT Duration Estimated Length of Stay: 7-10days  PT Treatment/Interventions: Ambulation/gait training;Balance/vestibular training;Cognitive remediation/compensation;Community reintegration;Discharge planning;Disease management/prevention;DME/adaptive equipment instruction;Functional mobility training;Neuromuscular re-education;Pain management;Patient/family education;Psychosocial support;Skin care/wound management;Stair training;Therapeutic Activities;Therapeutic Exercise;UE/LE Strength taining/ROM;UE/LE Coordination activities;Visual/perceptual remediation/compensation;Wheelchair propulsion/positioning PT Transfers Anticipated Outcome(s): Mod I with LRAD  PT Locomotion Anticipated Outcome(s): Mod I at  home level  PT Recommendation Recommendations for Other Services: Therapeutic Recreation consult Therapeutic Recreation Interventions: Pet therapy Follow Up Recommendations: Home health PT Patient destination: Home Equipment Recommended: Rolling walker with 5" wheels;To be determined  Skilled Therapeutic Intervention PT instructed patient in PT Evaluation and initiated treatment intervention; see below for results. PT educated patient in POC, rehab potential, rehab goals, and discharge recommendations. Pt demonstrates min assist from PT for all functional transfers and gait up to 67ft with RW as well as Min assist for car transfers and ascent/descent of one step. Patient returned too room and left sitting in [facility] with call bell in reach and all needs met.    Session2.  Pt received supine in bed and agreeable to PT. Supine>sit transfer with supervisoin assist and min cues. Stand pivot transfer to [facility] with min assist. Pt instructed in 39m WT: 0.37 m/s (increased fall risk<..8 m/s) and 5xSTS 47 seconds (increased fall risk >15 seconds). Nustep level 1 x 6 minutes pt noted to required prolonged rest break following nustep due to SOB and fatigue. SpO2 remained >95% throughout treatment.  Patient returned too room and left sitting in [facility] with call bell in reach and all needs met.      PT Evaluation Precautions/Restrictions   General   Vital Signs  Pain    0/10  Home Living/Prior Functioning Home Living Available Help at Discharge: Family;Available 24 hours/day Type of Home: Apartment Home Access: Stairs to enter Entrance Stairs-Number of Steps: 6 Entrance Stairs-Rails: Right;Left;Can reach both Home Layout: One level Bathroom Shower/Tub: Engineer, manufacturing systems: Standard  Lives With: Family (Mother and  3 grandchildren) Prior Function Level of Independence: Independent with basic ADLs;Independent with homemaking with ambulation;Independent with gait;Independent with transfers  Able  to Take Stairs?: Yes Driving: No Vocation: Full time employment Vocation Requirements: works from home for  [employer] Leisure: Hobbies-yes (Comment) Comments: Sports, grandchildren Vision/Perception  Perception Perception: Within Functional Limits Praxis Praxis: Intact  Cognition Overall Cognitive Status: Within Functional Limits for tasks assessed Arousal/Alertness: Awake/alert Memory: Appears intact Awareness: Appears intact Problem Solving: Appears intact Safety/Judgment: Appears intact Sensation Sensation Light Touch: Appears Intact Proprioception: Appears Intact Coordination Gross Motor Movements are Fluid and Coordinated: Yes Fine Motor Movements are Fluid and Coordinated: Yes Motor  Motor Motor: Other (comment) Motor - Skilled Clinical Observations: Generalized weakness and deconditationing.   Mobility Bed Mobility Bed Mobility: Rolling Right;Rolling Left;Supine to Sit;Sit to Supine Rolling Right: 5: Supervision Rolling Right Details: Verbal cues for precautions/safety Rolling Left: 5: Supervision Rolling Left Details: Verbal cues for precautions/safety Supine to Sit: 5: Supervision Supine to Sit Details: Verbal cues for precautions/safety Sit to Supine: 5: Supervision Sit to Supine - Details: Verbal cues for precautions/safety Transfers Transfers: Yes Sit to Stand: 4: Min assist Sit to Stand Details: Verbal cues for precautions/safety;Verbal cues for safe use of DME/AE Stand Pivot Transfers: 4: Min assist Stand Pivot Transfer Details: Verbal cues for precautions/safety;Verbal cues for safe use of DME/AE Locomotion  Ambulation Ambulation: Yes Ambulation/Gait Assistance: 4: Min assist Ambulation Distance (Feet): 25 Feet Assistive device: Rolling walker Ambulation/Gait Assistance Details: 0.69ms gait speed Gait Gait: Yes Gait Pattern: Impaired Gait Pattern: Decreased step length - right;Decreased step length - left;Shuffle Stairs / Additional  Locomotion Stairs: Yes Stairs Assistance: 4: Min assist Stairs Assistance Details: Verbal cues for precautions/safety;Verbal cues for gait pattern;Verbal cues for safe use of DME/AE Stair Management Technique: Two rails Number of Stairs: 1 Height of Stairs: 6 Wheelchair Mobility Wheelchair Mobility: Yes Wheelchair Assistance: 5: SInvestment banker, operationalDetails: Verbal cues for tMarketing executive Both upper extremities Wheelchair Parts Management: Needs assistance Distance: 1258f  Trunk/Postural Assessment  Cervical Assessment Cervical Assessment: Within Functional Limits Thoracic Assessment Thoracic Assessment: Exceptions to WFSh[facility] Surgery Center LLCRounded shoulders) Lumbar Assessment Lumbar Assessment: Exceptions to WFBiiospine OrlandoPosterior pelvic tilt) Postural Control Postural Control: Deficits on evaluation (Due to generalized weakness)  Balance Balance Balance Assessed: Yes Dynamic Sitting Balance Dynamic Sitting - Balance Support: During functional activity;Feet supported Dynamic Sitting - Level of Assistance: 6: Modified independent (Device/Increase time) Sitting balance - Comments: Sitting EOB to eat breakfast Static Standing Balance Static Standing - Balance Support: During functional activity;Right upper extremity supported;Left upper extremity supported Static Standing - Level of Assistance: 5: Stand by assistance;4: Min assist Dynamic Standing Balance Dynamic Standing - Balance Support: During functional activity;Right upper extremity supported;Left upper extremity supported Dynamic Standing - Level of Assistance: 4: Min assist Extremity Assessment      RLE Assessment RLE Assessment: Exceptions to WFSeven Hills Ambulatory Surgery CenterLE Assessment LLE Assessment: Exceptions to WFL LLE AROM (degrees) LLE Overall AROM Comments: limited hip flexion due to body  habitus all others assessed WFL.  LLE Strength LLE Overall Strength Comments: 4/5 to 4+/5 grossly proximal to distal    See Function  Navigator for Current Functional Status.   Refer to Care Plan for Long Term Goals  Recommendations for other services: Therapeutic Recreation  Pet therapy  Discharge Criteria: Patient will be discharged from PT if patient refuses treatment 3 consecutive times without medical reason, if treatment goals not met, if there is a change in medical status, if patient makes no progress towards goals or if patient is discharged from hospital.  The above assessment, treatment plan, treatment alternatives and goals were discussed and mutually agreed upon: by patient  AuLorie Phenix/17/2018, 5:34 AM

## 2017-03-19 NOTE — Progress Notes (Signed)
Physical Therapy Session Note  Patient Details  Name: Luis [name] MRN: [id number] Date of Birth: Oct 25, 1966  Today's Date: 03/19/2017 PT Concurrent Time: 1000-1100  Concurrent time: 60 min   Short Term Goals: Week 1:  PT Short Term Goal 1 (Week 1): STG=LTG due to ELOS  Skilled Therapeutic Interventions/Progress Updates:   Pt received sitting in WC and agreeable to PT  Gait x 25f with RW and min assist. Pt noted to have improved step length and foot clearance on this day compared to previous pt treatment. PT instructed pt in ascent and descent of 8 steps ( 3 inch ) with BUE support and min assist. Min cues for improved step to gait pattern and use of pursed lip breathing. Pt noted to require prolonged rest break due to fatigue after each activity.   Seated LE therex. LAQ and hip flexion with 6 lb weight 2x10 BLE. Hip abduction with level 4 tband x 12 and hip adduction to squeeze ball between thighs x 10 . Ankle PF x 15 BLE. Sit<>stand 2 x 5 with BUE support. Pt required prolonged rest breaks throughout treatment due to reported SOB and increased fatigue.   [facility] too [facility] and all needs met.        Therapy Documentation Precautions:  Precautions Precautions: Fall Restrictions Weight Bearing Restrictions: No Vital Signs: Oxygen Therapy SpO2: 96 % O2 Device: Nasal Cannula O2 Flow Rate (L/min): 2 L/min Pain: 0/10   See Function Navigator for Current Functional Status.   Therapy/Group: Individual Therapy  ALorie Phenix5/17/2018, 10:12 AM

## 2017-03-19 NOTE — Progress Notes (Signed)
Occupational Therapy Session Note  Patient Details  Name: Luis [name] MRN: [id number] Date of Birth: 1966-10-14  Today's Date: 03/19/2017 OT Individual Time: 0845-1000 and 1355-1450 OT Individual Time Calculation (min): 75 min and 55 min   Short Term Goals: Week 1:  OT Short Term Goal 1 (Week 1): STG=LTG due to LOS  Skilled Therapeutic Interventions/Progress Updates:    Session One: Pt seen for OT ADL bathing/dressing session. Pt sitting EOB upon arrival, finishing breakfast and agreeable to tx session. He denied pain this morning.  He ambulated with RW and close supervision into bathroom, VCs provided for RW management in functional context. He bathed seated on 3-1 BSC, standing with heavy reliance on grab bars to complete buttock hygiene. He dressed seated in w/c. Introduced sock aid for increased independence with LB dressing tasks. Following demonstration, pt able to use sock aid to don socks with min A.  He stood at sink to complete oral care task addressing functional standing balance and tolerance, VCs for upright posture.  Seated at sink, he completed shaving task mod I.  He self propelled w/c ~40 ft using B UEs for UE strengthening and endurance. Pt fatigued prior to making it to therapy gym and required total A for remainder of distance.  Pt left seated in w/c in gym at end of session with hand off to PT.  Pt displays some decreased awareness of deficits and realities of going home. Pt stated that he has no goals for rehab and that he feels he is ready to go home now. When asked about this by therapist, pt stated "well I don't like to set goals, in case I don't meet them". Pt stated that he completed x2 steps yesterday in PT session, OT brought up that pt must climb 6 steps to enter apartment, pt replied "oh yea, guess your right, maybe I'm not ready to go home." Will cont to educate and motivate pt in order to return to PLOF for increased independence.   Session Two: Pt seen for  OT session focusing on IADL goals, functional transfers, and UE strengthening. Pt sitting up in w/c upon arrival, RN present administering medications, agreeable to tx session.  He self propelled w/c throughout unit using B UEs/LEs with increased time, rest breaks required throughout.  Using RW, pt completed simulated laundry task. Completed at supervision- mod I level following demonstration for technique of RW management during task.  He then propelled to ADL apartment. Educated regarding use and set-up of tub transfer bench. Due to pt's weight, unable to complete transfer due to weight restrictions of bench. Pt ambulated into bathroom and therapist demonstrated technique for transfer. Printed off copy of heavy duty tub bench from [employer] site for recommendation of DME. In therapy gym, pt completed UE strengthening exercises uutilizing level III (blue theraband). Completed x2 trials of 10 reps chest expansion, diagonal up/ diagonal down, bicep curl, and upright row. Pt only able to recall 1 exercise following first set. Required extensive demonstration and cuing for proper form and technique with exercises. He returned to room at end of session, encouraged pt to stay sitting upright to build up right endurance, agreeable to sitting in recliner. Ambulated ~10 ft with RW to chair. Left seated in recliner at end of session, all needs in reach. Educated regarding use of call bell for assist with mobility when ready to transfer back to w/c.  Therapy Documentation Precautions:  Precautions Precautions: Fall Restrictions Weight Bearing Restrictions: No Pain:   No/ denies  pain  See Function Navigator for Current Functional Status.   Therapy/Group: Individual Therapy  Lewis, Kayleeann Huxford C 03/19/2017, 7:04 AM

## 2017-03-20 ENCOUNTER — Inpatient Hospital Stay (HOSPITAL_COMMUNITY): Payer: BLUE CROSS/BLUE SHIELD | Admitting: Physical Therapy

## 2017-03-20 ENCOUNTER — Inpatient Hospital Stay (HOSPITAL_COMMUNITY): Payer: BLUE CROSS/BLUE SHIELD

## 2017-03-20 ENCOUNTER — Inpatient Hospital Stay (HOSPITAL_COMMUNITY): Payer: BLUE CROSS/BLUE SHIELD | Admitting: Occupational Therapy

## 2017-03-20 LAB — GLUCOSE, CAPILLARY
GLUCOSE-CAPILLARY: 137 mg/dL — AB (ref 65–99)
GLUCOSE-CAPILLARY: 107 mg/dL — AB (ref 65–99)
Glucose-Capillary: 112 mg/dL — ABNORMAL HIGH (ref 65–99)
Glucose-Capillary: 136 mg/dL — ABNORMAL HIGH (ref 65–99)

## 2017-03-20 MED ORDER — LIVING BETTER WITH HEART FAILURE BOOK
Freq: Once | Status: AC
  Administered 2017-03-20: 15:00:00

## 2017-03-20 MED ORDER — INSULIN STARTER KIT- PEN NEEDLES (ENGLISH)
1.0000 | Freq: Once | Status: AC
  Administered 2017-03-20: 1
  Filled 2017-03-20: qty 1

## 2017-03-20 MED ORDER — TRAMADOL HCL 50 MG PO TABS
50.0000 mg | ORAL_TABLET | Freq: Four times a day (QID) | ORAL | Status: DC | PRN
  Administered 2017-03-20 – 2017-03-21 (×2): 50 mg via ORAL
  Filled 2017-03-20 (×2): qty 1

## 2017-03-20 NOTE — Progress Notes (Signed)
Subjective/Complaints:  Patient seen in the gym. He has no complaints today. His bowel and bladder function is normalizing.   ROS- neg N/V/D, neg CP or SOB  Objective: Vital Signs: Blood pressure 125/75, pulse 91, temperature 98.1 F (36.7 C), temperature source Oral, resp. rate 19, height 6' 3" (1.905 m), weight (!) 155.8 kg (343 lb 7.6 oz), SpO2 96 %. No results found. Results for orders placed or performed during the hospital encounter of 03/17/17 (from the past 72 hour(s))  CBC     Status: None   Collection Time: 03/17/17  8:37 PM  Result Value Ref Range   WBC 8.9 4.0 - 10.5 K/uL   RBC 4.24 4.22 - 5.81 MIL/uL   Hemoglobin 13.3 13.0 - 17.0 g/dL   HCT 40.9 39.0 - 52.0 %   MCV 96.5 78.0 - 100.0 fL   MCH 31.4 26.0 - 34.0 pg   MCHC 32.5 30.0 - 36.0 g/dL   RDW 14.6 11.5 - 15.5 %   Platelets 184 150 - 400 K/uL  Creatinine, serum     Status: Abnormal   Collection Time: 03/17/17  8:37 PM  Result Value Ref Range   Creatinine, Ser 1.26 (H) 0.61 - 1.24 mg/dL   GFR calc non Af Amer >60 >60 mL/min   GFR calc Af Amer >60 >60 mL/min    Comment: (NOTE) The eGFR has been calculated using the CKD EPI equation. This calculation has not been validated in all clinical situations. eGFR's persistently <60 mL/min signify possible Chronic Kidney Disease.   Glucose, capillary     Status: Abnormal   Collection Time: 03/17/17  9:06 PM  Result Value Ref Range   Glucose-Capillary 130 (H) 65 - 99 mg/dL   Comment 1 Notify RN    Comment 2 Document in Chart   Urinalysis, Routine w reflex microscopic     Status: None   Collection Time: 03/18/17  2:00 AM  Result Value Ref Range   Color, Urine YELLOW YELLOW   APPearance CLEAR CLEAR   Specific Gravity, Urine 1.021 1.005 - 1.030   pH 5.0 5.0 - 8.0   Glucose, UA NEGATIVE NEGATIVE mg/dL   Hgb urine dipstick NEGATIVE NEGATIVE   Bilirubin Urine NEGATIVE NEGATIVE   Ketones, ur NEGATIVE NEGATIVE mg/dL   Protein, ur NEGATIVE NEGATIVE mg/dL   Nitrite  NEGATIVE NEGATIVE   Leukocytes, UA NEGATIVE NEGATIVE  Culture, Urine     Status: None   Collection Time: 03/18/17  2:00 AM  Result Value Ref Range   Specimen Description URINE, CLEAN CATCH    Special Requests NONE    Culture NO GROWTH    Report Status 03/19/2017 FINAL   Glucose, capillary     Status: Abnormal   Collection Time: 03/18/17  6:18 AM  Result Value Ref Range   Glucose-Capillary 129 (H) 65 - 99 mg/dL  CBC WITH DIFFERENTIAL     Status: None   Collection Time: 03/18/17 10:22 AM  Result Value Ref Range   WBC 8.4 4.0 - 10.5 K/uL   RBC 4.25 4.22 - 5.81 MIL/uL   Hemoglobin 13.0 13.0 - 17.0 g/dL   HCT 41.1 39.0 - 52.0 %   MCV 96.7 78.0 - 100.0 fL   MCH 30.6 26.0 - 34.0 pg   MCHC 31.6 30.0 - 36.0 g/dL   RDW 14.2 11.5 - 15.5 %   Platelets 211 150 - 400 K/uL   Neutrophils Relative % 55 %   Neutro Abs 4.7 1.7 - 7.7 K/uL  Lymphocytes Relative 34 %   Lymphs Abs 2.9 0.7 - 4.0 K/uL   Monocytes Relative 9 %   Monocytes Absolute 0.7 0.1 - 1.0 K/uL   Eosinophils Relative 2 %   Eosinophils Absolute 0.2 0.0 - 0.7 K/uL   Basophils Relative 0 %   Basophils Absolute 0.0 0.0 - 0.1 K/uL  Comprehensive metabolic panel     Status: Abnormal   Collection Time: 03/18/17 10:22 AM  Result Value Ref Range   Sodium 140 135 - 145 mmol/L   Potassium 3.3 (L) 3.5 - 5.1 mmol/L   Chloride 108 101 - 111 mmol/L   CO2 23 22 - 32 mmol/L   Glucose, Bld 165 (H) 65 - 99 mg/dL   BUN 14 6 - 20 mg/dL   Creatinine, Ser 1.23 0.61 - 1.24 mg/dL   Calcium 8.5 (L) 8.9 - 10.3 mg/dL   Total Protein 7.3 6.5 - 8.1 g/dL   Albumin 3.0 (L) 3.5 - 5.0 g/dL   AST 29 15 - 41 U/L   ALT 52 17 - 63 U/L   Alkaline Phosphatase 86 38 - 126 U/L   Total Bilirubin 0.8 0.3 - 1.2 mg/dL   GFR calc non Af Amer >60 >60 mL/min   GFR calc Af Amer >60 >60 mL/min    Comment: (NOTE) The eGFR has been calculated using the CKD EPI equation. This calculation has not been validated in all clinical situations. eGFR's persistently <60  mL/min signify possible Chronic Kidney Disease.    Anion gap 9 5 - 15  Glucose, capillary     Status: Abnormal   Collection Time: 03/18/17 12:05 PM  Result Value Ref Range   Glucose-Capillary 130 (H) 65 - 99 mg/dL   Comment 1 Notify RN   Glucose, capillary     Status: Abnormal   Collection Time: 03/18/17  5:09 PM  Result Value Ref Range   Glucose-Capillary 159 (H) 65 - 99 mg/dL  Glucose, capillary     Status: Abnormal   Collection Time: 03/18/17  9:06 PM  Result Value Ref Range   Glucose-Capillary 154 (H) 65 - 99 mg/dL  Glucose, capillary     Status: Abnormal   Collection Time: 03/19/17  6:30 AM  Result Value Ref Range   Glucose-Capillary 124 (H) 65 - 99 mg/dL  Glucose, capillary     Status: Abnormal   Collection Time: 03/19/17 11:38 AM  Result Value Ref Range   Glucose-Capillary 127 (H) 65 - 99 mg/dL  Glucose, capillary     Status: Abnormal   Collection Time: 03/19/17  4:41 PM  Result Value Ref Range   Glucose-Capillary 112 (H) 65 - 99 mg/dL  Glucose, capillary     Status: Abnormal   Collection Time: 03/19/17  9:20 PM  Result Value Ref Range   Glucose-Capillary 146 (H) 65 - 99 mg/dL  Glucose, capillary     Status: Abnormal   Collection Time: 03/20/17  6:32 AM  Result Value Ref Range   Glucose-Capillary 137 (H) 65 - 99 mg/dL  Glucose, capillary     Status: Abnormal   Collection Time: 03/20/17 11:34 AM  Result Value Ref Range   Glucose-Capillary 107 (H) 65 - 99 mg/dL  Glucose, capillary     Status: Abnormal   Collection Time: 03/20/17  4:50 PM  Result Value Ref Range   Glucose-Capillary 112 (H) 65 - 99 mg/dL     HEENT: normal Cardio: RRR and no murmur Resp: CTA B/L and unlabored, reduced BS in bases GI: BS   positive and NT, ND Extremity:  Pulses positive and No Edema Skin:   Intact Neuro: Alert/Oriented and Abnormal Motor 4/5 In BUE and BLE Musc/Skel:  Other no pain with UE or LE ROM Gen NAD   Assessment/Plan: 1. Functional deficits secondary to Debility due  to respiratory failure which require 3+ hours per day of interdisciplinary therapy in a comprehensive inpatient rehab setting. Physiatrist is providing close team supervision and 24 hour management of active medical problems listed below. Physiatrist and rehab team continue to assess barriers to discharge/monitor patient progress toward functional and medical goals. FIM: Function - Bathing Position: Shower Body parts bathed by patient: Right arm, Left arm, Right upper leg, Left upper leg, Chest, Abdomen, Front perineal area, Right lower leg, Left lower leg, Back Body parts bathed by helper: Buttocks Assist Level:  (LH sponge)  Function- Upper Body Dressing/Undressing What is the patient wearing?: Pull over shirt/dress Pull over shirt/dress - Perfomed by patient: Thread/unthread right sleeve, Thread/unthread left sleeve, Put head through opening, Pull shirt over trunk Assist Level: More than reasonable time Set up : To obtain clothing/put away Function - Lower Body Dressing/Undressing What is the patient wearing?: Underwear, Pants, Non-skid slipper socks Position: Wheelchair/chair at sink Underwear - Performed by patient: Thread/unthread right underwear leg, Thread/unthread left underwear leg, Pull underwear up/down Pants- Performed by patient: Thread/unthread right pants leg, Thread/unthread left pants leg, Pull pants up/down, Fasten/unfasten pants Non-skid slipper socks- Performed by patient: Don/doff right sock, Don/doff left sock (Sock aid) Socks - Performed by patient: Don/doff right sock, Don/doff left sock Socks - Performed by helper: Don/doff right sock, Don/doff left sock Shoes - Performed by helper: Don/doff right shoe, Don/doff left shoe Assist for footwear: Supervision/touching assist Assist for lower body dressing: Supervision or verbal cues  Function - Toileting Toileting steps completed by patient: Adjust clothing prior to toileting Toileting steps completed by helper:  Performs perineal hygiene, Adjust clothing after toileting Toileting Assistive Devices: Grab bar or rail Assist level: Touching or steadying assistance (Pt.75%)  Function - Toilet Transfers Toilet transfer assistive device: Elevated toilet seat/BSC over toilet, Grab bar Assist level to toilet: Supervision or verbal cues Assist level from toilet: Supervision or verbal cues  Function - Chair/bed transfer Chair/bed transfer method: Stand pivot Chair/bed transfer assist level: Touching or steadying assistance (Pt > 75%) Chair/bed transfer assistive device: Armrests, Walker  Function - Locomotion: Wheelchair Will patient use wheelchair at discharge?: No Type: Manual Max wheelchair distance: 120ft  Assist Level: Supervision or verbal cues Assist Level: Supervision or verbal cues Wheel 150 feet activity did not occur: Safety/medical concerns Turns around,maneuvers to table,bed, and toilet,negotiates 3% grade,maneuvers on rugs and over doorsills: No Function - Locomotion: Ambulation Assistive device: Walker-rolling Max distance: 25ft  Assist level: Touching or steadying assistance (Pt > 75%) Assist level: Touching or steadying assistance (Pt > 75%) Walk 50 feet with 2 turns activity did not occur: Safety/medical concerns Walk 10 feet on uneven surfaces activity did not occur: Safety/medical concerns  Function - Comprehension Comprehension: Auditory Comprehension assist level: Follows basic conversation/direction with no assist  Function - Expression Expression: Verbal Expression assist level: Expresses basic needs/ideas: With no assist  Function - Social Interaction Social Interaction assist level: Interacts appropriately 75 - 89% of the time - Needs redirection for appropriate language or to initiate interaction.  Function - Problem Solving Problem solving assist level: Solves complex problems: With extra time  Function - Memory Memory assist level: More than reasonable amount  of time  Medical Problem List   and Plan: 1. Decreased functional mobility with debilitationsecondary to acute hypoxic respiratory failure/Acute encephalopathy -PT, OT, SLP cont CIR 2. DVT Prophylaxis/Anticoagulation: SQ heparin.Check vascular study 3. Pain Management: Tylenol as needed 4. Mood: Provide emotional support 5. Neuropsych: This patient iscapable of making decisions on hisown behalf. 6. Skin/Wound Care: Routine skin checks 7. Fluids/Electrolytes/Nutrition: Routine I&O with follow up chemestries 8.Morbid obesity. BMI 45.9 KG. Dietary follow-up 9.Diabetes mellitus with peripheral neuropathy.Hemoglobin A1c 8.2. Lantus insulin 10 units daily.Check blood sugars before meals and at bedtime, good control CBG (last 3)   Recent Labs  03/20/17 0632 03/20/17 1134 03/20/17 1650  GLUCAP 137* 107* 112*    10.COPD. Continue nebulizer treatments check oxygen saturations every shift -continue on 2L oxygen via [place] for now 11.Hypertension.Lopressor 37.5 mg twice a day.Monitor with increased mobility 12.PAF.Cardiac rate controlled. 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 Monitor for any signs of fluid overload 14.Hypokalemia. Follow-up K+ 3.3 , supplement 15.AKI.Creatinine stabilized. Follow-up chemistries creat 1.23 16.Medical noncompliance. Counseling  LOS (Days) 3 A FACE TO FACE EVALUATION WAS PERFORMED  , E 03/20/2017, 5:05 PM   

## 2017-03-20 NOTE — IPOC Note (Signed)
Overall Plan of Care [facility]) Patient Details Name: Luis [name] MRN: [id number] DOB: [birth year]  Admitting Diagnosis: Debility  Hospital Problems: Active Problems:   Diastolic congestive heart failure (HCC)   Morbid obesity (HCC)   Debility   Respiratory failure (HCC)     Functional Problem List: Nursing Edema, Endurance, Medication Management, Motor, Nutrition, Skin Integrity, Safety  PT Balance, Edema, Endurance, Safety  OT Balance, Endurance  SLP    TR         Basic ADL's: OT Grooming, Bathing, Dressing, Toileting     Advanced  ADL's: OT Simple Meal Preparation, Laundry     Transfers: PT Bed Mobility, Bed to Chair, Car, [employer], Civil Service fast streamer, Research scientist (life sciences): PT Ambulation, Psychologist, prison and probation services, Stairs     Additional Impairments: OT None  SLP        TR      Anticipated Outcomes Item Anticipated Outcome  Self Feeding Mod I  Swallowing      Basic self-care  Mod I  Toileting  Mod I   Bathroom Transfers Mod I  Bowel/Bladder  Pt will manage bowel and bladder with mod I assist at discharge.  Transfers  Mod I with LRAD   Locomotion  Mod I at home level   Communication     Cognition     Pain  Pt will manage pain at 3 or less on a scale of 0-10.   Safety/Judgment  Pt will remain free of falls and injury with min assist/cues.    Therapy Plan: PT Intensity: Minimum of 1-2 x/day ,45 to 90 minutes PT Frequency: 5 out of 7 days PT Duration Estimated Length of Stay: 7-10days  OT Intensity: Minimum of 1-2 x/day, 45 to 90 minutes OT Frequency: 5 out of 7 days OT Duration/Estimated Length of Stay: 7-10 days         Team Interventions: Nursing Interventions Patient/Family Education, Pain Management, Medication Management, Skin Care/Wound Management, Disease Management/Prevention, Discharge Planning  PT interventions Ambulation/gait training, Balance/vestibular training, Cognitive remediation/compensation, Community reintegration,  Discharge planning, Disease management/prevention, DME/adaptive equipment instruction, Functional mobility training, Neuromuscular re-education, Pain management, Patient/family education, Psychosocial support, Skin care/wound management, Stair training, Therapeutic Activities, Therapeutic Exercise, UE/LE Strength taining/ROM, UE/LE Coordination activities, Visual/perceptual remediation/compensation, Wheelchair propulsion/positioning  OT Interventions Warden/ranger, Discharge planning, Community reintegration, Fish farm manager, Functional mobility training, Patient/family education, Psychosocial support, Self Care/advanced ADL retraining, Therapeutic Activities, Therapeutic Exercise, UE/LE Strength taining/ROM, UE/LE Coordination activities  SLP Interventions    TR Interventions    SW/CM Interventions Discharge Planning, Psychosocial Support, Patient/Family Education    Team Discharge Planning: Destination: PT-Home ,OT- Home , SLP-  Projected Follow-up: PT-Home health PT, OT-  Home health OT, SLP-  Projected Equipment Needs: PT-Rolling walker with 5" wheels, To be determined, OT- Tub/shower bench, SLP-  Equipment Details: PT- , OT-  Patient/family involved in discharge planning: PT- Patient,  OT-Patient, Family member/caregiver, SLP-   MD ELOS: 7-10  d Medical Rehab Prognosis:  Good Assessment:  51 y.o.right handed malewith history of COPD with tobacco abuse,NICM,atrial fibrillation, 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 Noncompliant with medications over the past several months. Patient reported to be on no prescription medications at time of admission. Per chart review patient independent prior to admission living alone. One level home with 6 steps to entry. Works as Production manager.Family in the area plans to provideassistance on discharge.Presented 03/01/2017 with productive  cough, myalgias as  well as increased lower extremity edema. Oxygen saturation 84% on room air placed on CPAP. Chest x-ray reviewed, showing b/l infiltartes. Per report, diffuse patchy bilateral infiltrates as well as cardiomegaly concerning for pulmonary edema. Hyponatremia 132, BNP 243, troponin negative, urine drug screen negative. Patient required intubation and placed on broad-spectrum antibiotics. Maintain on IV diuresis. Echocardiogram with ejection fraction of 55% no wall motion abnormality grade 1 diastolic dysfunction. Antibiotic therapy course has been completed for influenza A pneumonia. Remainedintubated through 03/12/2017.AKI2.06 improved with gentle hydration and latest creatinine 1.28.Demonstrating some impaired memory and awareness deficits subcutaneous heparin for DVT prophylaxis   Now requiring 24/7 Rehab RN,MD, as well as CIR level PT, OT and SLP.  Treatment team will focus on ADLs and mobility with goals set at  ModI See Team Conference Notes for weekly updates to the plan of care

## 2017-03-20 NOTE — Progress Notes (Signed)
Pt reporting pain in rt side under rib cage/abd area is still painful and tylenol given earlier did not work for him. Dr. Wynn Banker notified and new orders received for tramadol 50 mg q 6 hrs and KUB. Radiology notified of new orders. For KUB. Pamelia Hoit

## 2017-03-20 NOTE — Progress Notes (Signed)
Occupational Therapy Session Note  Patient Details  Name: Luis [name] MRN: [id number] Date of Birth: [birth year]  Today's Date: 03/20/2017 OT Individual Time: 1000-1055 OT Individual Time Calculation (min): 55 min    Short Term Goals: Week 1:  OT Short Term Goal 1 (Week 1): STG=LTG due to LOS  Skilled Therapeutic Interventions/Progress Updates:    Pt seen for OT ADL bathing/dressing session. Pt received on toilet, ready to finish. Required assist for thoroughness following BM.  He bathed seated on 3-1 BSC using LH sponge  With set-up assist. Returned to chair to dress, standing with RW to pull pants up. Required assist for awareness to lock w/c brakes. He was able to cross ankle over knee with R LE to don sock, used sock aid with min A to don sock, able to recall technique taught in previous session. Following seated rest break, he stood at sink to complete grooming tasks with UE support on sink. Pt ambulated ~65ft with RW and supervision to recliner. Pt left seated in recliner at end of session, all needs in reach.   Therapy Documentation Precautions:  Precautions Precautions: Fall Restrictions Weight Bearing Restrictions: No Pain:   No/ denies pain  See Function Navigator for Current Functional Status.   Therapy/Group: Individual Therapy  Lewis, Damaria Stofko C 03/20/2017, 6:41 AM

## 2017-03-20 NOTE — Progress Notes (Signed)
Austin and Amy noted pt appears to be "drooling out sides of mouth" more today than usual and has delayed responses. VSS, tachycardia. Pt denies any other trouble other than abd pain that started after he drank the ensure this morning and has had two bowel movements since. Justice Deeds PA notified of reports from therapy staff. No other symptoms to report, no new orders received. Tylenol given for abd discomfort. Pamelia Hoit

## 2017-03-20 NOTE — Progress Notes (Signed)
Physical Therapy Session Note  Patient Details  Name: Luis [name] MRN: [id number] Date of Birth: 10/05/1966  Today's Date: 03/20/2017 PT Individual Time: 0805-0900 AND 1515-1615 PT Individual Time Calculation (min): 55 min AND 60 min   Short Term Goals: Week 1:  PT Short Term Goal 1 (Week 1): STG=LTG due to ELOS  Skilled Therapeutic Interventions/Progress Updates:   Pt received sitting on toilet and agreeable to PT. Toilet transfer to standing to perform perineal hygiene. Pt required min assist from PT to ensure cleanliness.   Pt instructed in dressing with supervision assist from PT. Sit<>stand performed with RW and supervision assist to pull underpants and pants to waist.  Pt performed WC mobility training with supervision assist from PT using BUE propulsion technique. X130ft. Min cues for equalized use of BUE to maintain straight trajectory as well as improved pursed lip breathing to control HR.   PT instructed pt in ascent of 4 steps (6") with BUE on rail with supervision assist from PT. Min cues for proper step to gait pattern to improve safety and decreased use of BUE as tolerated to simulate access to home. Prolonged rest break required following stair management.  BP following break 104/72, HR: 86  Gait training instructed by PT with RW and supervision assist x 100ft. Min cues for improved pursed lip breathing to control HR.  BP following gait, 113/79. HR 102. Noted to be irregular following gait.   Patient returned too room and left sitting in WC with call bell in reach and all needs met.     Session 2.   Pt received sitting in WC and agreeable to PT  PT transported pt to rehab gym in WC due to fatigue and energy consservation.   PT instructed pt in Curb step with RW x 4 to simulate access to home. Supervision assist from PT as well as moderate cues for sequencing and technique.   Gai training through rehab gym x 60 ft with RW with supervision assist from PT.    Seated therex for LE strengthening.with level 4 tband. Ankle PF, HS curl, LAQ, hip abduction. All completed x 10 with min cues from PT for improved ROM and decreased speed with eccentric movement.  .   Pt noted to be unaware that he was drooling from the left side of his mouth and demonstrated poorer awareness of deficits compared to previous treatments. PT instructed pt in facial movement including smile and raised eye brows with slight difference in the L vs the R side with both movement. Pt able to puff cheeks out and stick tongue out symmetrically.  RN made aware of findings.   Patient returned too room and left sitting in WC with call bell in reach and all needs met.        Therapy Documentation Precautions:  Precautions Precautions: Fall Restrictions Weight Bearing Restrictions: No    Vital Signs: Oxygen Therapy O2 Device: Not Delivered Pain: 4/10 R side from gas.    See Function Navigator for Current Functional Status.   Therapy/Group: Individual Therapy  Austin E Tucker 03/20/2017, 9:38 AM  

## 2017-03-20 NOTE — Progress Notes (Signed)
Patient continue to verbalize discomfort of sharp pain to right flank area with tenderness to palpate, no n/v , prior medication of Tramadol,Tylenol,, Pepcid and ambulation offers no relieve, very anxious. VS monitor, afebrile, Family member at bedside, reassurance provided On call notified awaiting reply

## 2017-03-20 NOTE — Progress Notes (Signed)
Social Work Patient ID: Luis [name], male   DOB: 07-14-1966, 51 y.o.   MRN: [id number]  Team feels pt will be ready for discharge on Tuesday 5/22. MD is aware and agreeable to this. Pt is happy to have a date and is looking forward to going home. Will need bariatric rolling walker and follow up PT. Will work on these needs.

## 2017-03-21 ENCOUNTER — Inpatient Hospital Stay (HOSPITAL_COMMUNITY): Payer: BLUE CROSS/BLUE SHIELD | Admitting: Occupational Therapy

## 2017-03-21 ENCOUNTER — Inpatient Hospital Stay (HOSPITAL_COMMUNITY): Payer: BLUE CROSS/BLUE SHIELD

## 2017-03-21 ENCOUNTER — Inpatient Hospital Stay (HOSPITAL_COMMUNITY): Payer: BLUE CROSS/BLUE SHIELD | Admitting: Physical Therapy

## 2017-03-21 DIAGNOSIS — I5032 Chronic diastolic (congestive) heart failure: Secondary | ICD-10-CM

## 2017-03-21 DIAGNOSIS — M7918 Myalgia, other site: Secondary | ICD-10-CM | POA: Diagnosis not present

## 2017-03-21 DIAGNOSIS — R109 Unspecified abdominal pain: Secondary | ICD-10-CM

## 2017-03-21 DIAGNOSIS — R1011 Right upper quadrant pain: Secondary | ICD-10-CM

## 2017-03-21 DIAGNOSIS — R400 Somnolence: Secondary | ICD-10-CM | POA: Diagnosis present

## 2017-03-21 LAB — COMPREHENSIVE METABOLIC PANEL
ALBUMIN: 3 g/dL — AB (ref 3.5–5.0)
ALT: 33 U/L (ref 17–63)
AST: 19 U/L (ref 15–41)
Alkaline Phosphatase: 89 U/L (ref 38–126)
Anion gap: 8 (ref 5–15)
BILIRUBIN TOTAL: 0.9 mg/dL (ref 0.3–1.2)
BUN: 11 mg/dL (ref 6–20)
CHLORIDE: 106 mmol/L (ref 101–111)
CO2: 21 mmol/L — AB (ref 22–32)
Calcium: 8.5 mg/dL — ABNORMAL LOW (ref 8.9–10.3)
Creatinine, Ser: 1.08 mg/dL (ref 0.61–1.24)
GFR calc Af Amer: >60 mL/min (ref >60)
GFR calc non Af Amer: >60 mL/min (ref >60)
GLUCOSE: 143 mg/dL — AB (ref 65–99)
POTASSIUM: 3.6 mmol/L (ref 3.5–5.1)
Sodium: 135 mmol/L (ref 135–145)
Total Protein: 7.4 g/dL (ref 6.5–8.1)

## 2017-03-21 LAB — URINALYSIS, ROUTINE W REFLEX MICROSCOPIC
Bacteria, UA: NONE SEEN
Bilirubin Urine: NEGATIVE
Glucose, UA: NEGATIVE mg/dL
Hgb urine dipstick: NEGATIVE
KETONES UR: NEGATIVE mg/dL
Leukocytes, UA: NEGATIVE
Nitrite: NEGATIVE
PH: 5 (ref 5.0–8.0)
PROTEIN: 30 mg/dL — AB
Specific Gravity, Urine: 1.021 (ref 1.005–1.030)
Squamous Epithelial / LPF: NONE SEEN

## 2017-03-21 LAB — CBC WITH DIFFERENTIAL/PLATELET
BASOS ABS: 0 10*3/uL (ref 0.0–0.1)
BASOS PCT: 0 %
Eosinophils Absolute: 0.1 10*3/uL (ref 0.0–0.7)
Eosinophils Relative: 1 %
HCT: 37.8 % — ABNORMAL LOW (ref 39.0–52.0)
HEMOGLOBIN: 12.1 g/dL — AB (ref 13.0–17.0)
LYMPHS PCT: 22 %
Lymphs Abs: 2.5 10*3/uL (ref 0.7–4.0)
MCH: 30.3 pg (ref 26.0–34.0)
MCHC: 32 g/dL (ref 30.0–36.0)
MCV: 94.7 fL (ref 78.0–100.0)
Monocytes Absolute: 1.2 10*3/uL — ABNORMAL HIGH (ref 0.1–1.0)
Monocytes Relative: 10 %
NEUTROS ABS: 7.7 10*3/uL (ref 1.7–7.7)
Neutrophils Relative %: 67 %
Platelets: 227 10*3/uL (ref 150–400)
RBC: 3.99 MIL/uL — ABNORMAL LOW (ref 4.22–5.81)
RDW: 14.4 % (ref 11.5–15.5)
WBC: 11.4 10*3/uL — AB (ref 4.0–10.5)

## 2017-03-21 LAB — GLUCOSE, CAPILLARY
GLUCOSE-CAPILLARY: 141 mg/dL — AB (ref 65–99)
Glucose-Capillary: 129 mg/dL — ABNORMAL HIGH (ref 65–99)
Glucose-Capillary: 130 mg/dL — ABNORMAL HIGH (ref 65–99)
Glucose-Capillary: 116 mg/dL — ABNORMAL HIGH (ref 65–99)

## 2017-03-21 MED ORDER — ALPRAZOLAM 0.5 MG PO TABS
0.5000 mg | ORAL_TABLET | Freq: Once | ORAL | Status: AC
  Administered 2017-03-21: 0.5 mg via ORAL
  Filled 2017-03-21: qty 1

## 2017-03-21 MED ORDER — KETOROLAC TROMETHAMINE 15 MG/ML IJ SOLN
15.0000 mg | Freq: Once | INTRAMUSCULAR | Status: AC
  Administered 2017-03-21: 15 mg via INTRAMUSCULAR
  Filled 2017-03-21 (×2): qty 1

## 2017-03-21 MED ORDER — SACCHAROMYCES BOULARDII 250 MG PO CAPS
250.0000 mg | ORAL_CAPSULE | Freq: Two times a day (BID) | ORAL | Status: DC
  Administered 2017-03-21 – 2017-03-23 (×5): 250 mg via ORAL
  Filled 2017-03-21 (×5): qty 1

## 2017-03-21 MED ORDER — TRAMADOL HCL 50 MG PO TABS
100.0000 mg | ORAL_TABLET | Freq: Four times a day (QID) | ORAL | Status: DC | PRN
  Administered 2017-03-21 – 2017-03-22 (×5): 100 mg via ORAL
  Filled 2017-03-21 (×5): qty 2

## 2017-03-21 NOTE — Progress Notes (Signed)
On call re-notifed x2 awiaiting reply  sister remains at beside continue to vent concerns r/t unresolved pain of patient , reassurance provided, Patient currently up at bedside denies episodes of passing flatus while in BR, staff NT states that patient was successful in passing flatus and residue.

## 2017-03-21 NOTE — Progress Notes (Signed)
Occupational Therapy Session Note  Patient Details  Name: Luis [name] MRN: [id number] Date of Birth: 01-06-1966  Today's Date: 03/21/2017 OT Individual Time: 1962-2297 OT Individual Time Calculation (min): 60 min  OT Missed Minutes: 30 min   Short Term Goals: Week 1:  OT Short Term Goal 1 (Week 1): STG=LTG due to LOS  Skilled Therapeutic Interventions/Progress Updates:  Tx focus on holistic pain mgt strategies.   Pt greeted in w/c, reporting 9/10 abdominal pain and anxious regarding results of recent medical procedure. Per RN, pt was not due for pain medication. Pt agreeable to engage in 3 guided visualization exercises (facilitated by OT) as well as therapeutic self selected music listening afterwards to reduce feelings of pain/anxiety. Pt very receptive to meditations, requesting certain settings to visualize during practice (i.e. Ocean, mountain, forest). At end of tx pt reported his pain lowered to 7/10 with noticeable improvement in affect. "Thanks. That helped a lot." Pt left with all needs within reach at time of departure.   2nd Session 1:1 tx (30 minutes missed) Pt in w/c at time of arrival, still reporting significant pain/lethargy with pt requiring cues to keep eyes open to talk with OT. He declined returning to bed. Pt ultimately declining tx due to not feeling well/pain. 30 minutes missed.      Therapy Documentation Precautions:  Precautions Precautions: Fall Restrictions Weight Bearing Restrictions: No General: General PT Missed Treatment Reason: Pain;Patient fatigue Vital Signs: Oxygen Therapy SpO2: 96 % O2 Device: Nasal Cannula O2 Flow Rate (L/min): 2 L/min Pain: Pain Assessment Pain Assessment: 0-10 Pain Score: 9  Pain Type: Acute pain Pain Location: Flank Pain Orientation: Right Pain Descriptors / Indicators: Aching ADL:      See Function Navigator for Current Functional Status.   Therapy/Group: Individual Therapy  Vergie Zahm A  Loman Logan 03/21/2017, 12:39 PM

## 2017-03-21 NOTE — Progress Notes (Signed)
Family contact notified and informed of current update information and orders

## 2017-03-21 NOTE — Progress Notes (Addendum)
Subjective/Complaints:  Pt c/o abd pain which started yesterday after drinking ensure.  Had several BMs ponts to RUQ, no distress,  Drowsy.  Denies flank pain  ROS- neg N/V/D, neg CP or SOB  Objective: Vital Signs: Blood pressure 137/86, pulse (!) 102, temperature 97.7 F (36.5 C), temperature source Oral, resp. rate 19, height '6\' 3"'$  (1.905 m), weight (!) 155.8 kg (343 lb 7.6 oz), SpO2 95 %. Dg Abd Portable 1v  Result Date: 03/20/2017 CLINICAL DATA:  Right-sided pain under ribs. EXAM: PORTABLE ABDOMEN - 1 VIEW COMPARISON:  None. FINDINGS: Bowel gas pattern is nonobstructive with several air-filled nondilated loops of large and small bowel. No evidence of mass or mass effect. Mild degenerative change of the spine and hips. Hardware intact over the right proximal femur. IMPRESSION: No acute findings. Electronically Signed   By: Marin Olp M.D.   On: 03/20/2017 21:30   Results for orders placed or performed during the hospital encounter of 03/17/17 (from the past 72 hour(s))  Glucose, capillary     Status: Abnormal   Collection Time: 03/18/17  6:18 AM  Result Value Ref Range   Glucose-Capillary 129 (H) 65 - 99 mg/dL  CBC WITH DIFFERENTIAL     Status: None   Collection Time: 03/18/17 10:22 AM  Result Value Ref Range   WBC 8.4 4.0 - 10.5 K/uL   RBC 4.25 4.22 - 5.81 MIL/uL   Hemoglobin 13.0 13.0 - 17.0 g/dL   HCT 41.1 39.0 - 52.0 %   MCV 96.7 78.0 - 100.0 fL   MCH 30.6 26.0 - 34.0 pg   MCHC 31.6 30.0 - 36.0 g/dL   RDW 14.2 11.5 - 15.5 %   Platelets 211 150 - 400 K/uL   Neutrophils Relative % 55 %   Neutro Abs 4.7 1.7 - 7.7 K/uL   Lymphocytes Relative 34 %   Lymphs Abs 2.9 0.7 - 4.0 K/uL   Monocytes Relative 9 %   Monocytes Absolute 0.7 0.1 - 1.0 K/uL   Eosinophils Relative 2 %   Eosinophils Absolute 0.2 0.0 - 0.7 K/uL   Basophils Relative 0 %   Basophils Absolute 0.0 0.0 - 0.1 K/uL  Comprehensive metabolic panel     Status: Abnormal   Collection Time: 03/18/17 10:22 AM   Result Value Ref Range   Sodium 140 135 - 145 mmol/L   Potassium 3.3 (L) 3.5 - 5.1 mmol/L   Chloride 108 101 - 111 mmol/L   CO2 23 22 - 32 mmol/L   Glucose, Bld 165 (H) 65 - 99 mg/dL   BUN 14 6 - 20 mg/dL   Creatinine, Ser 1.23 0.61 - 1.24 mg/dL   Calcium 8.5 (L) 8.9 - 10.3 mg/dL   Total Protein 7.3 6.5 - 8.1 g/dL   Albumin 3.0 (L) 3.5 - 5.0 g/dL   AST 29 15 - 41 U/L   ALT 52 17 - 63 U/L   Alkaline Phosphatase 86 38 - 126 U/L   Total Bilirubin 0.8 0.3 - 1.2 mg/dL   GFR calc non Af Amer >60 >60 mL/min   GFR calc Af Amer >60 >60 mL/min    Comment: (NOTE) The eGFR has been calculated using the CKD EPI equation. This calculation has not been validated in all clinical situations. eGFR's persistently <60 mL/min signify possible Chronic Kidney Disease.    Anion gap 9 5 - 15  Glucose, capillary     Status: Abnormal   Collection Time: 03/18/17 12:05 PM  Result Value Ref Range  Glucose-Capillary 130 (H) 65 - 99 mg/dL   Comment 1 Notify RN   Glucose, capillary     Status: Abnormal   Collection Time: 03/18/17  5:09 PM  Result Value Ref Range   Glucose-Capillary 159 (H) 65 - 99 mg/dL  Glucose, capillary     Status: Abnormal   Collection Time: 03/18/17  9:06 PM  Result Value Ref Range   Glucose-Capillary 154 (H) 65 - 99 mg/dL  Glucose, capillary     Status: Abnormal   Collection Time: 03/19/17  6:30 AM  Result Value Ref Range   Glucose-Capillary 124 (H) 65 - 99 mg/dL  Glucose, capillary     Status: Abnormal   Collection Time: 03/19/17 11:38 AM  Result Value Ref Range   Glucose-Capillary 127 (H) 65 - 99 mg/dL  Glucose, capillary     Status: Abnormal   Collection Time: 03/19/17  4:41 PM  Result Value Ref Range   Glucose-Capillary 112 (H) 65 - 99 mg/dL  Glucose, capillary     Status: Abnormal   Collection Time: 03/19/17  9:20 PM  Result Value Ref Range   Glucose-Capillary 146 (H) 65 - 99 mg/dL  Glucose, capillary     Status: Abnormal   Collection Time: 03/20/17  6:32 AM   Result Value Ref Range   Glucose-Capillary 137 (H) 65 - 99 mg/dL  Glucose, capillary     Status: Abnormal   Collection Time: 03/20/17 11:34 AM  Result Value Ref Range   Glucose-Capillary 107 (H) 65 - 99 mg/dL  Glucose, capillary     Status: Abnormal   Collection Time: 03/20/17  4:50 PM  Result Value Ref Range   Glucose-Capillary 112 (H) 65 - 99 mg/dL  Glucose, capillary     Status: Abnormal   Collection Time: 03/20/17  8:57 PM  Result Value Ref Range   Glucose-Capillary 136 (H) 65 - 99 mg/dL     HEENT: normal Cardio: RRR and no murmur Resp: CTA B/L and unlabored, reduced BS in bases GI: BS positive and mild distention, inconsistent tenderness RUQ, after palpation of entire abd the RUQ was re examined and not tender Extremity:  Pulses positive and No Edema Skin:   Intact Neuro: Alert/Oriented and Abnormal Motor 4/5 In BUE and BLE Musc/Skel:  Other no pain with UE or LE ROM Gen NAD   Assessment/Plan: 1. Functional deficits secondary to Debility due to respiratory failure which require 3+ hours per day of interdisciplinary therapy in a comprehensive inpatient rehab setting. Physiatrist is providing close team supervision and 24 hour management of active medical problems listed below. Physiatrist and rehab team continue to assess barriers to discharge/monitor patient progress toward functional and medical goals. FIM: Function - Bathing Position: Shower Body parts bathed by patient: Right arm, Left arm, Right upper leg, Left upper leg, Chest, Abdomen, Front perineal area, Right lower leg, Left lower leg, Back Body parts bathed by helper: Buttocks Assist Level:  (LH sponge)  Function- Upper Body Dressing/Undressing What is the patient wearing?: Pull over shirt/dress Pull over shirt/dress - Perfomed by patient: Thread/unthread right sleeve, Thread/unthread left sleeve, Put head through opening, Pull shirt over trunk Assist Level: More than reasonable time Set up : To obtain  clothing/put away Function - Lower Body Dressing/Undressing What is the patient wearing?: Underwear, Pants, Non-skid slipper socks Position: Wheelchair/chair at sink Underwear - Performed by patient: Thread/unthread right underwear leg, Thread/unthread left underwear leg, Pull underwear up/down Pants- Performed by patient: Thread/unthread right pants leg, Thread/unthread left pants leg, Pull pants  up/down, Fasten/unfasten pants Non-skid slipper socks- Performed by patient: Don/doff right sock, Don/doff left sock (Sock aid) Socks - Performed by patient: Don/doff right sock, Don/doff left sock Socks - Performed by helper: Don/doff right sock, Don/doff left sock Shoes - Performed by helper: Don/doff right shoe, Don/doff left shoe Assist for footwear: Supervision/touching assist Assist for lower body dressing: Supervision or verbal cues  Function - Toileting Toileting steps completed by patient: Adjust clothing prior to toileting Toileting steps completed by helper: Performs perineal hygiene, Adjust clothing after toileting Toileting Assistive Devices: Grab bar or rail Assist level: Touching or steadying assistance (Pt.75%)  Function - Air cabin crew transfer assistive device: Elevated toilet seat/BSC over toilet, Grab bar Assist level to toilet: Supervision or verbal cues Assist level from toilet: Supervision or verbal cues  Function - Chair/bed transfer Chair/bed transfer method: Stand pivot Chair/bed transfer assist level: Supervision or verbal cues Chair/bed transfer assistive device: Armrests, Walker  Function - Locomotion: Wheelchair Will patient use wheelchair at discharge?: No Type: Manual Max wheelchair distance: 160f  Assist Level: Supervision or verbal cues Assist Level: Supervision or verbal cues Wheel 150 feet activity did not occur: Safety/medical concerns Assist Level: Supervision or verbal cues Turns around,maneuvers to table,bed, and toilet,negotiates 3%  grade,maneuvers on rugs and over doorsills: No Function - Locomotion: Ambulation Assistive device: Walker-rolling Max distance: 1039f Assist level: Supervision or verbal cues Assist level: Supervision or verbal cues Walk 50 feet with 2 turns activity did not occur: Safety/medical concerns Assist level: Supervision or verbal cues Walk 10 feet on uneven surfaces activity did not occur: Safety/medical concerns  Function - Comprehension Comprehension: Auditory Comprehension assist level: Follows basic conversation/direction with no assist  Function - Expression Expression: Verbal Expression assist level: Expresses basic needs/ideas: With no assist  Function - Social Interaction Social Interaction assist level: Interacts appropriately 75 - 89% of the time - Needs redirection for appropriate language or to initiate interaction.  Function - Problem Solving Problem solving assist level: Solves complex problems: With extra time  Function - Memory Memory assist level: More than reasonable amount of time  Medical Problem List and Plan: 1. Decreased functional mobility with debilitationsecondary to acute hypoxic respiratory failure/Acute encephalopathy -PT, OT, SLP cont CIR 2. DVT Prophylaxis/Anticoagulation: SQ heparin.Check vascular study 3. Pain Management: Tylenol as needed, morphine and demerol allergy , trial tramadol, may need toradol 4. Mood: Provide emotional support 5. Neuropsych: This patient iscapable of making decisions on hisown behalf. 6. Skin/Wound Care: Routine skin checks 7. Fluids/Electrolytes/Nutrition: Routine I&O with follow up chemestries 8.Morbid obesity. BMI 45.9 KG. Dietary follow-up 9.Diabetes mellitus with peripheral neuropathy.Hemoglobin A1c 8.2. Lantus insulin 10 units daily.Check blood sugars before meals and at bedtime, good control CBG (last 3)   Recent Labs  03/20/17 1134 03/20/17 1650 03/20/17 2057  GLUCAP 107* 112* 136*     10.COPD. Continue nebulizer treatments check oxygen saturations every shift -continue on 2L oxygen via [place] for now 11.Hypertension.Lopressor 37.5 mg twice a day.Monitor with increased mobility 12.PAF.Cardiac rate controlled. 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 Monitor for any signs of fluid overload 14.Hypokalemia. Follow-up K+ 3.3 , supplement 15.AKI.Creatinine stabilized. Follow-up chemistries creat 1.23 16.Medical noncompliance. Counseling 17.  RUQ pain not consistent- will check gallbladder ultrasound, has hx of chronic ileus off reglan due to freq stool,   Start probiotic due to recent abx LOS (Days) 4 A FACE TO FACE EVALUATION WAS PERFORMED  KIRSTEINS,ANDREW E 03/21/2017, 5:38 AM

## 2017-03-21 NOTE — Progress Notes (Signed)
Physical Therapy Session Note  Patient Details  Name: Luis [name] MRN: [id number] Date of Birth: 1966-01-28  Today's Date: 03/21/2017 PT Individual Time: 825-850 AND 1415-1425 PT Individual Time Calculation (min): 25 min 10 min   Short Term Goals: Week 1:  PT Short Term Goal 1 (Week 1): STG=LTG due to ELOS  Skilled Therapeutic Interventions/Progress Updates:   Pt received supine in bed with supplemental O2 via nasal cannula per request. SpO2 assessed at 95% and HR at 89. Pt continues to reports R flank pain at 8/10 that has been constant since yesterday afternoon. Ultrasound technician present for assessment. Pt left supine in bed for ultrasound.  PT returned following Procedure. Pt Performed supine>sit without cues or assist from PT, but required heavy use of bed features. Sitting EOB pt reports pain increased to 9/10. Vitals assessed without supplemental O2: BP 144/80, HR 91, SpO2 96.  PT encouraged ambulation and therex for pain management, but pt declined to this time, stating that he believes exercise would make pain worse. With encouragement pt became agreeable to ambulatory transfer to [facility] from bed with RW. Supervision assist form PT for safety. PT assessed Vitals once in [facility] without Supplemental O2, HR 78(but irregular), SpO2 95%.  Pt left sitting in WC with RN to administer pain medication.   Session 2.  Pt received sitting in WC, asleep. Aroused Easily. Pt reports continued pain in R side and declines to participate in PT at this time. PT educated pt in importance of continued physical activity to maintain improvements in functional mobility and decreased length of stay. Pt reports that he tried walking yesterday to help with abdominal pain and stated that it hurt even more following gait around room. Pt states that he understands the need to remain physically active, but wants to understand cause of pain before engaging in any more therapy. PT will reattempt therapy at later time.         Therapy Documentation Precautions:  Precautions Precautions: Fall Restrictions Weight Bearing Restrictions: No General: PT Amount of Missed Time (min): 35 Minutes PT Missed Treatment Reason: Pain;Patient fatigue Vital Signs: Therapy Vitals Temp: 100.2 F (37.9 C) Temp Source: Oral Pulse Rate: 88 Resp: 18 BP: 135/82 Patient Position (if appropriate): Sitting Oxygen Therapy SpO2: 98 % O2 Device: Nasal Cannula O2 Flow Rate (L/min): 2 L/min Pain: Pain Assessment Pain Assessment: 0-10 Pain Score: 9  Pain Type: Acute pain Pain Location: Flank Pain Orientation: Right Pain Descriptors / Indicators: Aching   See Function Navigator for Current Functional Status.   Therapy/Group: Individual Therapy  Golden Pop 03/21/2017, 8:13 AM

## 2017-03-21 NOTE — Progress Notes (Signed)
Notified by Dr Wynn Banker and provided update of status changes and general concerns, new orders received and carried out Update provided to patient

## 2017-03-21 NOTE — Progress Notes (Signed)
Sister remains at bedside and spoke concerning increase pain and dissatisfaction with not receiving a resolution r/t to pain of her family member. Patient is OOB in chair at bedside c/o of constant pain to right flank area rating pain 9/10 on pain scale. No facial grimance of discomfort noted appears anxious. Notified on call, Medical Director and PA to address concerns. Sister insist this is addressed. Awaiting reply .  Tramadol given. Will monitor response.

## 2017-03-21 NOTE — Consult Note (Signed)
Medical Consultation   Luis [name]  ZOX:[id number]  DOB: 09-12-1966  DOA: 03/17/2017  PCP: [name], MD   Requesting physician: Ria Clock, MD ; PM&R  Reason for consultation: Abdominal Pain  History of Present Illness: Luis [name] is an 51 y.o. 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 Noncompliant with medications over the past several months. Patient reported to be on no prescription medications at time of admission. Per chart review  patient independent prior to admission living alone. One level home with 6 steps to entry. Works as Production manager.Family in the area plans to provide assistance on discharge.Presented 03/01/2017 with productive cough, myalgias as well as increased lower extremity edema. Oxygen saturation 84% on room air placed on CPAP. Chest x-ray reviewed, showing b/l infiltartes. Patient required intubation for pneumonais and resp failure and placed on broad-spectrum antibiotics. Maintain on IV diuresis.  Antibiotic therapy course has been completed for influenza A pneumonia. Remainedintubated through 03/12/2017. AKI 2.06 improved with gentle hydration and latest creatinine 1.28. Demonstrating some impaired memory and awareness deficits subcutaneous. Patient was admitted for a comprehensive rehab program on 03/17/2017. Yesterday the patient developed significant numbers of bowel movements having recovered from his ileus. He had suffered an ileus the days prior to transfer to rehabilitation. He has had multiple bowel movements in the past 24 hours. He is extraordinarily deconditioned after having been admitted on April 29 [facility] for respiratory failure. He was intubated on the ventilator for a significant amount of time and is now doing rehabilitation due to severe  deconditioning.  Dr. Jodean Lima has done significant workup on the patient including a urinalysis which is unremarkable, and ultrasound of the right upper quadrant which showed no gallbladder abnormalities, and chemistries and white blood cell count which only show a slightly elevated white blood cell count of 11. Patient states the pain is worse when he moves around it is located in the right flank. He does not have back pain pain does not radiate to his bladder or into his upper chest. Pain severity is at about a 5 out of 10.  Patient states that pain began yesterday after he had his significant bowel movement. Pain has been constant and worsens with movement. Pain medication makes it better. He has no associated fever. He has a slight elevated white blood cell count. Has no nausea vomiting or diarrhea.  Review of Systems:  Review of Systems  Constitutional: Negative for chills and fever.  HENT: Negative for congestion, nosebleeds and sinus pain.   Eyes: Negative for blurred vision and double vision.  Respiratory: Negative for cough, hemoptysis and sputum production.   Cardiovascular: Negative for chest pain, palpitations and orthopnea.  Gastrointestinal: Negative for heartburn, nausea and vomiting.  Genitourinary: Negative for dysuria, frequency, hematuria and urgency.  Musculoskeletal: Positive for myalgias.  Skin: Negative for itching and rash.  Neurological: Positive for weakness. Negative for dizziness, tingling, focal weakness and headaches.  Endo/Heme/Allergies: Negative for environmental allergies. Does not bruise/bleed easily.  Psychiatric/Behavioral: Negative for depression, substance abuse and suicidal ideas.   Past Medical History: Past Medical History:  Diagnosis Date  . Atrial fibrillation (HCC)   . CHF (congestive heart failure) (HCC)   . DM (diabetes mellitus) (HCC)   . Essential hypertension   . High cholesterol   .  History of tobacco abuse   . HTN (hypertension)   .  Hyperlipidemia   . Morbid obesity (HCC)   . Obesity   . OSA (obstructive sleep apnea)   . Pneumonia ~ 12/2015; 03/19/2016    Past Surgical History: Past Surgical History:  Procedure Laterality Date  . CARDIAC CATHETERIZATION N/A 03/24/2016   Procedure: Left Heart Cath and Coronary Angiography;  Surgeon: Marykay Lex, MD;  Location: [facility] INVASIVE CV LAB;  Service: Cardiovascular;  Laterality: N/A;  . FEMUR IM NAIL Right 2012  . FRACTURE SURGERY       Allergies:   Allergies  Allergen Reactions  . Demerol [Meperidine] Itching  . Morphine And Related Itching  . Morphine And Related Itching     Social History:  reports that he quit smoking about 8 years ago. His smoking use included Cigarettes. He has a 15.00 pack-year smoking history. He has never used smokeless tobacco. He reports that he does not drink alcohol or use drugs.   Family History: Family History  Problem Relation Age of Onset  . Diabetes type II Mother   . Diabetes type II Other   . Other Unknown        never knew father  . Stroke Neg Hx   . Cancer Neg Hx      Physical Exam: Vitals:   03/20/17 2105 03/20/17 2345 03/21/17 0628 03/21/17 0927  BP:   135/82   Pulse:   88   Resp:   18   Temp:  97.7 F (36.5 C) 100.2 F (37.9 C)   TempSrc:  Oral Oral   SpO2: 95%  98% 96%  Weight:      Height:        Constitutional:Super morbidly obese male sitting in chair,  semi-Alert and Somnolent, oriented x3, not in any acute distress. Eyes: PERLA, EOMI, irises appear normal, anicteric sclera,  ENMT: external ears and nose appear normal, normal hearing             Lips appears normal, oropharynx mucosa, tongue, posterior pharynx appear normal  Neck: neck appears normal, no masses, normal ROM, no thyromegaly, no JVD  CVS: S1-S2 clear, no murmur rubs or gallops, no LE edema, normal pedal pulses  Respiratory:  clear to auscultation bilaterally, no wheezing, rales or rhonchi. Respiratory effort normal. No accessory  muscle use.  Abdomen: soft nontender, nondistended, normal bowel sounds, no hepatosplenomegaly, no hernias. Pain in right lateral flank with palpation (worse pain with movement) Musculoskeletal: : no cyanosis, clubbing or edema noted bilaterally; requiring assistance with mobility. No CVA tenderness with palpation Neuro: Cranial nerves II-XII intact, strength, sensation, reflexes Psych: judgement and insight appear normal, stable mood and affect, mental status Skin: no rashes or lesions or ulcers, no induration or nodules   Data reviewed:  I have personally reviewed following labs and imaging studies Labs:  CBC:  Recent Labs Lab 03/15/17 0311 03/16/17 0252 03/17/17 2037 03/18/17 1022 03/21/17 0852  WBC 10.3 9.7 8.9 8.4 11.4*  NEUTROABS  --   --   --  4.7 7.7  HGB 12.5* 12.6* 13.3 13.0 12.1*  HCT 40.3 39.8 40.9 41.1 37.8*  MCV 99.0 96.6 96.5 96.7 94.7  PLT 192 202 184 211 227    Basic Metabolic Panel:  Recent Labs Lab 03/15/17 0311 03/16/17 0252 03/16/17 0732 03/17/17 1055 03/17/17 2037 03/18/17 1022 03/21/17 0852  NA 147* 144  --  139  --  140 135  K 3.3* 2.9*  --  3.3*  --  3.3* 3.6  CL 113* 108  --  109  --  108 106  CO2 27 25  --  21*  --  23 21*  GLUCOSE 123* 147*  --  180*  --  165* 143*  BUN 23* 24*  --  18  --  14 11  CREATININE 1.37* 1.34*  --  1.28* 1.26* 1.23 1.08  CALCIUM 8.2* 8.1*  --  8.3*  --  8.5* 8.5*  MG  --   --  2.2 2.0  --   --   --   PHOS  --   --   --  2.2*  --   --   --    GFR Estimated Creatinine Clearance: 132.2 mL/min (by C-G formula based on SCr of 1.08 mg/dL). Liver Function Tests:  Recent Labs Lab 03/15/17 0311 03/18/17 1022 03/21/17 0852  AST 37 29 19  ALT 55 52 33  ALKPHOS 67 86 89  BILITOT 0.8 0.8 0.9  PROT 6.9 7.3 7.4  ALBUMIN 2.7* 3.0* 3.0*   CBG:  Recent Labs Lab 03/20/17 1134 03/20/17 1650 03/20/17 2057 03/21/17 0631 03/21/17 1148  GLUCAP 107* 112* 136* 141* 129*   Urinalysis    Component Value Date/Time    COLORURINE AMBER (A) 03/21/2017 0815   APPEARANCEUR CLEAR 03/21/2017 0815   LABSPEC 1.021 03/21/2017 0815   PHURINE 5.0 03/21/2017 0815   GLUCOSEU NEGATIVE 03/21/2017 0815   HGBUR NEGATIVE 03/21/2017 0815   BILIRUBINUR NEGATIVE 03/21/2017 0815   KETONESUR NEGATIVE 03/21/2017 0815   PROTEINUR 30 (A) 03/21/2017 0815   NITRITE NEGATIVE 03/21/2017 0815   LEUKOCYTESUR NEGATIVE 03/21/2017 0815     Microbiology Recent Results (from the past 240 hour(s))  Culture, Urine     Status: None   Collection Time: 03/18/17  2:00 AM  Result Value Ref Range Status   Specimen Description URINE, CLEAN CATCH  Final   Special Requests NONE  Final   Culture NO GROWTH  Final   Report Status 03/19/2017 FINAL  Final       Inpatient Medications:   Scheduled Meds: . budesonide (PULMICORT) nebulizer solution  0.5 mg Nebulization BID  . feeding supplement (ENSURE ENLIVE)  237 mL Oral BID BM  . heparin  5,000 Units Subcutaneous Q8H  . hydrocerin   Topical BID  . insulin aspart  0-5 Units Subcutaneous QHS  . insulin glargine  10 Units Subcutaneous Daily  . metoprolol tartrate  37.5 mg Oral BID  . pantoprazole  40 mg Oral Q1200  . potassium chloride  10 mEq Oral Daily  . saccharomyces boulardii  250 mg Oral BID   Continuous Infusions:   Radiological Exams on Admission: Dg Abd Portable 1v  Result Date: 03/20/2017 CLINICAL DATA:  Right-sided pain under ribs. EXAM: PORTABLE ABDOMEN - 1 VIEW COMPARISON:  None. FINDINGS: Bowel gas pattern is nonobstructive with several air-filled nondilated loops of large and small bowel. No evidence of mass or mass effect. Mild degenerative change of the spine and hips. Hardware intact over the right proximal femur. IMPRESSION: No acute findings. Electronically Signed   By: Elberta Fortis M.D.   On: 03/20/2017 21:30   US Abdomen Limited Ruq  Result Date: 03/21/2017 CLINICAL DATA:  Abdominal and right upper quadrant pain since yesterday. EXAM: US ABDOMEN LIMITED -  RIGHT UPPER QUADRANT COMPARISON:  None. FINDINGS: Gallbladder: Difficult to visualize due to body habitus and bowel gas. No definite gallstones or wall thickening visualized. No sonographic Murphy sign noted by sonographer. Common bile duct:  Diameter: Difficult to visualize due to body habitus and bowel gas. Measures approximately 5 mm, within normal limits. Liver: Difficult to visualize due to body habitus and bowel gas. Parenchyma is likely increased in echogenicity diffusely. IMPRESSION: 1. Challenging exam due to body habitus and increased bowel gas. 2. Suspect hepatic steatosis. Electronically Signed   By: Leanna Battles M.D.   On: 03/21/2017 08:37    Impression/Recommendations Active Problems:   Daytime somnolence   Muscular abdominal pain in right flank   Diastolic congestive heart failure (HCC)   Morbid obesity (HCC)   Debility   Respiratory failure (HCC)  1. Daytime somnolence. Patient has body habitus consistent with a person who might suffer from obesity hypoventilation syndrome. Although this patient works he may have some aspect of that given his significant fatigue and debility from prolonged illness and ventilation in the ICU. He is encouraged to stay out of bed and to resume normal activities including ambulation. I have encouraged patient to use his incentive spirometer. Somnolence persists would consider obtaining arterial blood gas in the a.m.  While patient was on the ventilator he did reflect some evidence of hypercarbia. Patient might benefit from use of BiPAP if hypercarbia is persistent.  2. Muscular abdominal pain and right flank: Patient has no evidence of appendicitis, cholecystitis, or kidney stones by examination. His white count is slightly elevated he's running a low-grade elevation in temperature 100.2. I believe this is due to atelectasis. I do not believe that this is due to appendicitis as his pain is not at all in the location of his appendix. With regards to  cholecystitis his liver function tests are normal he has a negative sonographic Eulah Pont sign he has a negative Murphy sign on examination and has no elevation in white blood cell count, ultrasound was of poor quality but did not reveal any evidence of cholecystic fluid collection. In terms of kidney stone the patient has essentially no blood in his urine and he does not have any CVA tenderness. With a bland urine and no CVA tenderness kidney stones are essentially ruled out. Patient himself thinks that his pain is muscular in origin but states that Tylenol has not been sufficient to relieve his pain. Even his somnolence I am not inclined to order any type of narcotic. I will give him a dose of IM ketorolac and monitor his response to it. Spoken to Dr. Jodean Lima regarding the patient's management and he is in full agreement. He also stated the patient did not respond to Ultram. I do believe ultimately the patient's pain will resolve once he improves his level of activity. A K pad to the flank may be helpful as well.  3. Diastolic congestive heart failure repeat echocardiogram while hospitalized showed significant improvement continue present management.  4. Morbid obesity patient needs significant dietary discretion and management.  5. Debility continue with physical medicine and rehabilitation program.  6. Respiratory failure. Patient may benefit from evaluation for sleep apnea. I would request that nursing staff keep an eye his breathing at night or perhaps check a trending pulse oximetry if patient has desaturations would request a sleep study and possible use of CPAP while in the rehab facility.    Thank you for this consultation.  Our [facility] hospitalist team will follow the patient with you.   Time Spent: 55 min  Lahoma Crocker M.D. [facility] 03/21/2017, 12:30 PM

## 2017-03-22 DIAGNOSIS — R400 Somnolence: Secondary | ICD-10-CM

## 2017-03-22 DIAGNOSIS — R1031 Right lower quadrant pain: Secondary | ICD-10-CM

## 2017-03-22 DIAGNOSIS — M791 Myalgia: Secondary | ICD-10-CM

## 2017-03-22 LAB — COMPREHENSIVE METABOLIC PANEL
ALBUMIN: 2.9 g/dL — AB (ref 3.5–5.0)
ALK PHOS: 93 U/L (ref 38–126)
ALT: 34 U/L (ref 17–63)
ANION GAP: 11 (ref 5–15)
AST: 20 U/L (ref 15–41)
BUN: 14 mg/dL (ref 6–20)
CALCIUM: 8.7 mg/dL — AB (ref 8.9–10.3)
CO2: 20 mmol/L — ABNORMAL LOW (ref 22–32)
CREATININE: 1.15 mg/dL (ref 0.61–1.24)
Chloride: 103 mmol/L (ref 101–111)
Glucose, Bld: 125 mg/dL — ABNORMAL HIGH (ref 65–99)
POTASSIUM: 3.8 mmol/L (ref 3.5–5.1)
Sodium: 134 mmol/L — ABNORMAL LOW (ref 135–145)
TOTAL PROTEIN: 7.7 g/dL (ref 6.5–8.1)
Total Bilirubin: 0.9 mg/dL (ref 0.3–1.2)

## 2017-03-22 LAB — CBC
HEMATOCRIT: 37.9 % — AB (ref 39.0–52.0)
HEMOGLOBIN: 12.3 g/dL — AB (ref 13.0–17.0)
MCH: 30.8 pg (ref 26.0–34.0)
MCHC: 32.5 g/dL (ref 30.0–36.0)
MCV: 95 fL (ref 78.0–100.0)
Platelets: 207 10*3/uL (ref 150–400)
RBC: 3.99 MIL/uL — ABNORMAL LOW (ref 4.22–5.81)
RDW: 14.5 % (ref 11.5–15.5)
WBC: 12.2 10*3/uL — ABNORMAL HIGH (ref 4.0–10.5)

## 2017-03-22 LAB — GLUCOSE, CAPILLARY
Glucose-Capillary: 103 mg/dL — ABNORMAL HIGH (ref 65–99)
Glucose-Capillary: 119 mg/dL — ABNORMAL HIGH (ref 65–99)
Glucose-Capillary: 97 mg/dL (ref 65–99)
Glucose-Capillary: 133 mg/dL — ABNORMAL HIGH (ref 65–99)

## 2017-03-22 LAB — URINE CULTURE

## 2017-03-22 MED ORDER — LIDOCAINE 5 % EX PTCH
1.0000 | MEDICATED_PATCH | CUTANEOUS | Status: DC
  Administered 2017-03-22 – 2017-03-23 (×2): 1 via TRANSDERMAL
  Filled 2017-03-22 (×2): qty 1

## 2017-03-22 MED ORDER — HYDROCODONE-ACETAMINOPHEN 5-325 MG PO TABS
1.0000 | ORAL_TABLET | Freq: Four times a day (QID) | ORAL | Status: DC | PRN
  Administered 2017-03-22: 1 via ORAL
  Administered 2017-03-22: 2 via ORAL
  Administered 2017-03-23: 1 via ORAL
  Filled 2017-03-22: qty 1
  Filled 2017-03-22: qty 2
  Filled 2017-03-22: qty 1
  Filled 2017-03-22: qty 2

## 2017-03-22 NOTE — Progress Notes (Signed)
Physical Therapy Note  Patient Details  Name: Luis [name] MRN: [id number] Date of Birth: [birth year] Today's Date: 03/22/2017    Attempted to see pt to make up missed time from yesterday. Pt reports continued pain in R side, as well as fatigue. Pt declines participation on this date but reports he may be able to participate tomorrow. Will f/u per plan of care.    Sandi Mariscal 03/22/2017, 11:11 AM

## 2017-03-22 NOTE — Progress Notes (Signed)
MD on department and  informed of patient refusal and uncooperative behavior to participate in his basic care, noted yelling  at staff when asked to be assisted to bed or recliner,has consumed no meal in the last 24 hrs.Emotional support provided,

## 2017-03-22 NOTE — Progress Notes (Addendum)
PROGRESS NOTE    Luis [name]  ZOX:[id number] DOB: [birth year] DOA: 03/17/2017 PCP: [name], MD  Brief Narrative:Luis [name] is an 51 y.o. 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 Noncompliant with medications over the past several months.  Presented 03/01/2017 with productive cough, myalgias as well as increased lower extremity edema. Oxygen saturation 84% on room air placed on CPAP. Chest x-ray reviewed, showing b/l infiltartes. Patient required intubation for pneumonia/resp failure treated with broad-spectrum antibiotics/IV diuresis. Antibiotic therapy course has been completed for influenza A pneumonia. Remainedintubated through 03/12/2017. AKI2.06 improved with gentle hydration and latest creatinine 1.28.Demonstrating some impaired memory and awareness deficits subcutaneous. Patient was admitted for a comprehensive rehab program on 03/17/2017. 5/18 patient developed significant numbers of bowel movements having recovered from his ileus. He had suffered an ileus the days prior to transfer to rehabilitation now in rehabilitation due to severe deconditioning. Workup includes unremarkable UA,/RUQ US/labs except mild leukocytosis  Assessment & Plan:   1. R sided abd pain -etiology unclear, could be muscular or related to recent ileus -workup, UA/RUQ [place], labs unremarkable so far -WBC mildly elevated, afebrile and vitals normal -continue supportive care, vicodin PRN =-hold for drowsiness/sedation -if no improvement, will check CT abd pelvis tomorrow  2. Chronic diastolic CHF -appears volume overloaded, resume diuretics soon, didn't start today since Po intake very poor now  We will FU in am  Subjective: Still having R sided pain  Objective: Vitals:   03/21/17 2100 03/21/17 2200 03/22/17 0645 03/22/17 0948  BP: 127/76 127/76  123/75   Pulse: 88 88 90   Resp:  20 18   Temp:   98.6 F (37 C)   TempSrc:   Oral   SpO2:  99% 98% 94%  Weight:      Height:        Intake/Output Summary (Last 24 hours) at 03/22/17 1052 Last data filed at 03/21/17 1200  Gross per 24 hour  Intake              240 ml  Output                0 ml  Net              240 ml   Filed Weights   03/18/17 0515 03/19/17 0542 03/20/17 0450  Weight: (!) 156.2 kg (344 lb 7.1 oz) (!) 156.3 kg (344 lb 11 oz) (!) 155.8 kg (343 lb 7.6 oz)    Examination:  General exam: Appears calm but uncomfortable Respiratory system: diminished at bases Cardiovascular system: S1 & S2 heard, RRR. No JVD Gastrointestinal system: Abdomen is soft, obese, mild RLQ tenderness. Normal bowel sounds heard. Central nervous system: Alert and oriented. No focal neurological deficits. Extremities: Symmetric 5 x 5 power, 1 plus edema Skin: No rashes, lesions or ulcers Psychiatry: Judgement and insight appear normal. Mood & affect appropriate.     Data Reviewed:   CBC:  Recent Labs Lab 03/16/17 0252 03/17/17 2037 03/18/17 1022 03/21/17 0852 03/22/17 0721  WBC 9.7 8.9 8.4 11.4* 12.2*  NEUTROABS  --   --  4.7 7.7  --   HGB 12.6* 13.3 13.0 12.1* 12.3*  HCT 39.8 40.9 41.1 37.8* 37.9*  MCV 96.6 96.5 96.7 94.7 95.0  PLT 202 184 211 227 207   Basic Metabolic Panel:  Recent Labs Lab 03/16/17 0252 03/16/17 0732 03/17/17 1055 03/17/17 2037 03/18/17  1022 03/21/17 0852 03/22/17 0721  NA 144  --  139  --  140 135 134*  K 2.9*  --  3.3*  --  3.3* 3.6 3.8  CL 108  --  109  --  108 106 103  CO2 25  --  21*  --  23 21* 20*  GLUCOSE 147*  --  180*  --  165* 143* 125*  BUN 24*  --  18  --  14 11 14   CREATININE 1.34*  --  1.28* 1.26* 1.23 1.08 1.15  CALCIUM 8.1*  --  8.3*  --  8.5* 8.5* 8.7*  MG  --  2.2 2.0  --   --   --   --   PHOS  --   --  2.2*  --   --   --   --    GFR: Estimated Creatinine Clearance: 124.2 mL/min (by C-G formula based on SCr of 1.15  mg/dL). Liver Function Tests:  Recent Labs Lab 03/18/17 1022 03/21/17 0852 03/22/17 0721  AST 29 19 20   ALT 52 33 34  ALKPHOS 86 89 93  BILITOT 0.8 0.9 0.9  PROT 7.3 7.4 7.7  ALBUMIN 3.0* 3.0* 2.9*   No results for input(s): LIPASE, AMYLASE in the last 168 hours. No results for input(s): AMMONIA in the last 168 hours. Coagulation Profile: No results for input(s): INR, PROTIME in the last 168 hours. Cardiac Enzymes: No results for input(s): CKTOTAL, CKMB, CKMBINDEX, TROPONINI in the last 168 hours. BNP (last 3 results) No results for input(s): PROBNP in the last 8760 hours. HbA1C: No results for input(s): HGBA1C in the last 72 hours. CBG:  Recent Labs Lab 03/21/17 0631 03/21/17 1148 03/21/17 1642 03/21/17 2049 03/22/17 0651  GLUCAP 141* 129* 130* 116* 103*   Lipid Profile: No results for input(s): CHOL, HDL, LDLCALC, TRIG, CHOLHDL, LDLDIRECT in the last 72 hours. Thyroid Function Tests: No results for input(s): TSH, T4TOTAL, FREET4, T3FREE, THYROIDAB in the last 72 hours. Anemia Panel: No results for input(s): VITAMINB12, FOLATE, FERRITIN, TIBC, IRON, RETICCTPCT in the last 72 hours. Urine analysis:    Component Value Date/Time   COLORURINE AMBER (A) 03/21/2017 0815   APPEARANCEUR CLEAR 03/21/2017 0815   LABSPEC 1.021 03/21/2017 0815   PHURINE 5.0 03/21/2017 0815   GLUCOSEU NEGATIVE 03/21/2017 0815   HGBUR NEGATIVE 03/21/2017 0815   BILIRUBINUR NEGATIVE 03/21/2017 0815   KETONESUR NEGATIVE 03/21/2017 0815   PROTEINUR 30 (A) 03/21/2017 0815   NITRITE NEGATIVE 03/21/2017 0815   LEUKOCYTESUR NEGATIVE 03/21/2017 0815   Sepsis Labs: @LABRCNTIP (procalcitonin:4,lacticidven:4)  ) Recent Results (from the past 240 hour(s))  Culture, Urine     Status: None   Collection Time: 03/18/17  2:00 AM  Result Value Ref Range Status   Specimen Description URINE, CLEAN CATCH  Final   Special Requests NONE  Final   Culture NO GROWTH  Final   Report Status 03/19/2017 FINAL   Final  Urine culture     Status: Abnormal   Collection Time: 03/21/17  8:15 AM  Result Value Ref Range Status   Specimen Description URINE, CLEAN CATCH  Final   Special Requests NONE  Final   Culture <10,000 COLONIES/mL INSIGNIFICANT GROWTH (A)  Final   Report Status 03/22/2017 FINAL  Final         Radiology Studies: Dg Chest 2 View  Result Date: 03/21/2017 CLINICAL DATA:  Atrial fibrillation.  Pneumonia. EXAM: CHEST  2 VIEW COMPARISON:  None. FINDINGS: The study is limited due  to patient body habitus. No pneumothorax. There may be increased opacity in the retrocardiac region based on the lateral view. Cardiomegaly. No overt edema. No nodule, mass, or other acute abnormality. IMPRESSION: The study is limited due to patient body habitus. Infiltrate in the left retrocardiac region not excluded based on the lateral view. Recommend clinical correlation and attention on follow-up. Electronically Signed   By: Gerome Sam III M.D   On: 03/21/2017 12:56   Dg Abd Portable 1v  Result Date: 03/20/2017 CLINICAL DATA:  Right-sided pain under ribs. EXAM: PORTABLE ABDOMEN - 1 VIEW COMPARISON:  None. FINDINGS: Bowel gas pattern is nonobstructive with several air-filled nondilated loops of large and small bowel. No evidence of mass or mass effect. Mild degenerative change of the spine and hips. Hardware intact over the right proximal femur. IMPRESSION: No acute findings. Electronically Signed   By: Elberta Fortis M.D.   On: 03/20/2017 21:30   US Abdomen Limited Ruq  Result Date: 03/21/2017 CLINICAL DATA:  Abdominal and right upper quadrant pain since yesterday. EXAM: US ABDOMEN LIMITED - RIGHT UPPER QUADRANT COMPARISON:  None. FINDINGS: Gallbladder: Difficult to visualize due to body habitus and bowel gas. No definite gallstones or wall thickening visualized. No sonographic Murphy sign noted by sonographer. Common bile duct: Diameter: Difficult to visualize due to body habitus and bowel gas. Measures  approximately 5 mm, within normal limits. Liver: Difficult to visualize due to body habitus and bowel gas. Parenchyma is likely increased in echogenicity diffusely. IMPRESSION: 1. Challenging exam due to body habitus and increased bowel gas. 2. Suspect hepatic steatosis. Electronically Signed   By: Leanna Battles M.D.   On: 03/21/2017 08:37        Scheduled Meds: . budesonide (PULMICORT) nebulizer solution  0.5 mg Nebulization BID  . feeding supplement (ENSURE ENLIVE)  237 mL Oral BID BM  . heparin  5,000 Units Subcutaneous Q8H  . hydrocerin   Topical BID  . insulin aspart  0-5 Units Subcutaneous QHS  . insulin glargine  10 Units Subcutaneous Daily  . metoprolol tartrate  37.5 mg Oral BID  . pantoprazole  40 mg Oral Q1200  . potassium chloride  10 mEq Oral Daily  . saccharomyces boulardii  250 mg Oral BID   Continuous Infusions:   LOS: 5 days    Time spent:    Zannie Cove, MD [facility] Pager [phone]  If 7PM-7AM, please contact night-coverage [email or web address] Password [email or web address] 03/22/2017, 10:52 AM

## 2017-03-22 NOTE — Progress Notes (Signed)
While sitting in wheelchair at bedside observed patient dozing off to sleep refuse assistance from staff to go to bed or to his recliner,states he' will take responsibility if he should fall'.Continue to encourage his participation and to monitor but remains uncooperative, safety measures in place and monitoring.

## 2017-03-22 NOTE — Progress Notes (Signed)
Occupational Therapy Session Note  Patient Details  Name: Luis [name] MRN: [id number] Date of Birth: 1966-06-24  Today's Date: 03/22/2017 OT Individual Time: 4401-0272 OT Individual Time Calculation (min): 65 min    Short Term Goals: Week 1:  OT Short Term Goal 1 (Week 1): STG=LTG due to LOS  Skilled Therapeutic Interventions/Progress Updates:    Tx focus on pain mgt, ADL retraining, endurance, and functional transfers.   Pt greeted in w/c, still reporting significant pain. Pt provided therapeutic listening and MAX(!) encouragement to engage in skilled OT for makeup time. Pt agreeable to shower. Pt completed stand step transfer to bench with min guard. Pt using LH sponge for reaching back and LEs, close supervision for completing pericare while standing with grab bars (played [employer] songs for pain mgt during shower). Pt then transferred back to w/c to proceed with dressing. He required assist for footwear due to fatigue. Oral care/grooming tasks completed standing at sink with supervision. He was then left in w/c with all needs within reach. "Thank you. This helped a lot."   02 sats 90-91% on RA with activity, 94% on supplemental 02 at end of session.   Therapy Documentation Precautions:  Precautions Precautions: Fall Restrictions Weight Bearing Restrictions: No General:   Vital Signs: Oxygen Therapy SpO2: 94 % O2 Device: Nasal Cannula O2 Flow Rate (L/min): 2 L/min Pain: 9/10 in right side. Therapeutic use of music utilized for pain mgt.    ADL:      See Function Navigator for Current Functional Status.   Therapy/Group: Individual Therapy  [name] 03/22/2017, 12:14 PM

## 2017-03-22 NOTE — Progress Notes (Addendum)
Subjective/Complaints:  Per RN non compliant with staff assist for mobility, doesn't want to go to bed, has abd pain that increases with movement, not worse with intake although intake is poor  ROS- neg N/V/D, neg CP or SOB  Objective: Vital Signs: Blood pressure 123/75, pulse 90, temperature 98.6 F (37 C), temperature source Oral, resp. rate 18, height '6\' 3"'$  (1.905 m), weight (!) 155.8 kg (343 lb 7.6 oz), SpO2 98 %. Dg Chest 2 View  Result Date: 03/21/2017 CLINICAL DATA:  Atrial fibrillation.  Pneumonia. EXAM: CHEST  2 VIEW COMPARISON:  None. FINDINGS: The study is limited due to patient body habitus. No pneumothorax. There may be increased opacity in the retrocardiac region based on the lateral view. Cardiomegaly. No overt edema. No nodule, mass, or other acute abnormality. IMPRESSION: The study is limited due to patient body habitus. Infiltrate in the left retrocardiac region not excluded based on the lateral view. Recommend clinical correlation and attention on follow-up. Electronically Signed   By: Dorise Bullion III M.D   On: 03/21/2017 12:56   Dg Abd Portable 1v  Result Date: 03/20/2017 CLINICAL DATA:  Right-sided pain under ribs. EXAM: PORTABLE ABDOMEN - 1 VIEW COMPARISON:  None. FINDINGS: Bowel gas pattern is nonobstructive with several air-filled nondilated loops of large and small bowel. No evidence of mass or mass effect. Mild degenerative change of the spine and hips. Hardware intact over the right proximal femur. IMPRESSION: No acute findings. Electronically Signed   By: Marin Olp M.D.   On: 03/20/2017 21:30   US Abdomen Limited Ruq  Result Date: 03/21/2017 CLINICAL DATA:  Abdominal and right upper quadrant pain since yesterday. EXAM: US ABDOMEN LIMITED - RIGHT UPPER QUADRANT COMPARISON:  None. FINDINGS: Gallbladder: Difficult to visualize due to body habitus and bowel gas. No definite gallstones or wall thickening visualized. No sonographic Murphy sign noted by  sonographer. Common bile duct: Diameter: Difficult to visualize due to body habitus and bowel gas. Measures approximately 5 mm, within normal limits. Liver: Difficult to visualize due to body habitus and bowel gas. Parenchyma is likely increased in echogenicity diffusely. IMPRESSION: 1. Challenging exam due to body habitus and increased bowel gas. 2. Suspect hepatic steatosis. Electronically Signed   By: Lorin Picket M.D.   On: 03/21/2017 08:37   Results for orders placed or performed during the hospital encounter of 03/17/17 (from the past 72 hour(s))  Glucose, capillary     Status: Abnormal   Collection Time: 03/19/17 11:38 AM  Result Value Ref Range   Glucose-Capillary 127 (H) 65 - 99 mg/dL  Glucose, capillary     Status: Abnormal   Collection Time: 03/19/17  4:41 PM  Result Value Ref Range   Glucose-Capillary 112 (H) 65 - 99 mg/dL  Glucose, capillary     Status: Abnormal   Collection Time: 03/19/17  9:20 PM  Result Value Ref Range   Glucose-Capillary 146 (H) 65 - 99 mg/dL  Glucose, capillary     Status: Abnormal   Collection Time: 03/20/17  6:32 AM  Result Value Ref Range   Glucose-Capillary 137 (H) 65 - 99 mg/dL  Glucose, capillary     Status: Abnormal   Collection Time: 03/20/17 11:34 AM  Result Value Ref Range   Glucose-Capillary 107 (H) 65 - 99 mg/dL  Glucose, capillary     Status: Abnormal   Collection Time: 03/20/17  4:50 PM  Result Value Ref Range   Glucose-Capillary 112 (H) 65 - 99 mg/dL  Glucose, capillary  Status: Abnormal   Collection Time: 03/20/17  8:57 PM  Result Value Ref Range   Glucose-Capillary 136 (H) 65 - 99 mg/dL  Glucose, capillary     Status: Abnormal   Collection Time: 03/21/17  6:31 AM  Result Value Ref Range   Glucose-Capillary 141 (H) 65 - 99 mg/dL  Urinalysis, Routine w reflex microscopic     Status: Abnormal   Collection Time: 03/21/17  8:15 AM  Result Value Ref Range   Color, Urine AMBER (A) YELLOW    Comment: BIOCHEMICALS MAY BE AFFECTED  BY COLOR   APPearance CLEAR CLEAR   Specific Gravity, Urine 1.021 1.005 - 1.030   pH 5.0 5.0 - 8.0   Glucose, UA NEGATIVE NEGATIVE mg/dL   Hgb urine dipstick NEGATIVE NEGATIVE   Bilirubin Urine NEGATIVE NEGATIVE   Ketones, ur NEGATIVE NEGATIVE mg/dL   Protein, ur 30 (A) NEGATIVE mg/dL   Nitrite NEGATIVE NEGATIVE   Leukocytes, UA NEGATIVE NEGATIVE   RBC / HPF 0-5 0 - 5 RBC/hpf   WBC, UA 0-5 0 - 5 WBC/hpf   Bacteria, UA NONE SEEN NONE SEEN   Squamous Epithelial / LPF NONE SEEN NONE SEEN   Mucous PRESENT    Hyaline Casts, UA PRESENT   Urine culture     Status: Abnormal   Collection Time: 03/21/17  8:15 AM  Result Value Ref Range   Specimen Description URINE, CLEAN CATCH    Special Requests NONE    Culture <10,000 COLONIES/mL INSIGNIFICANT GROWTH (A)    Report Status 03/22/2017 FINAL   CBC with Differential/Platelet     Status: Abnormal   Collection Time: 03/21/17  8:52 AM  Result Value Ref Range   WBC 11.4 (H) 4.0 - 10.5 K/uL   RBC 3.99 (L) 4.22 - 5.81 MIL/uL   Hemoglobin 12.1 (L) 13.0 - 17.0 g/dL   HCT 37.8 (L) 39.0 - 52.0 %   MCV 94.7 78.0 - 100.0 fL   MCH 30.3 26.0 - 34.0 pg   MCHC 32.0 30.0 - 36.0 g/dL   RDW 14.4 11.5 - 15.5 %   Platelets 227 150 - 400 K/uL   Neutrophils Relative % 67 %   Neutro Abs 7.7 1.7 - 7.7 K/uL   Lymphocytes Relative 22 %   Lymphs Abs 2.5 0.7 - 4.0 K/uL   Monocytes Relative 10 %   Monocytes Absolute 1.2 (H) 0.1 - 1.0 K/uL   Eosinophils Relative 1 %   Eosinophils Absolute 0.1 0.0 - 0.7 K/uL   Basophils Relative 0 %   Basophils Absolute 0.0 0.0 - 0.1 K/uL  Comprehensive metabolic panel     Status: Abnormal   Collection Time: 03/21/17  8:52 AM  Result Value Ref Range   Sodium 135 135 - 145 mmol/L   Potassium 3.6 3.5 - 5.1 mmol/L   Chloride 106 101 - 111 mmol/L   CO2 21 (L) 22 - 32 mmol/L   Glucose, Bld 143 (H) 65 - 99 mg/dL   BUN 11 6 - 20 mg/dL   Creatinine, Ser 1.08 0.61 - 1.24 mg/dL   Calcium 8.5 (L) 8.9 - 10.3 mg/dL   Total Protein  7.4 6.5 - 8.1 g/dL   Albumin 3.0 (L) 3.5 - 5.0 g/dL   AST 19 15 - 41 U/L   ALT 33 17 - 63 U/L   Alkaline Phosphatase 89 38 - 126 U/L   Total Bilirubin 0.9 0.3 - 1.2 mg/dL   GFR calc non Af Amer >60 >60 mL/min   GFR  calc Af Amer >60 >60 mL/min    Comment: (NOTE) The eGFR has been calculated using the CKD EPI equation. This calculation has not been validated in all clinical situations. eGFR's persistently <60 mL/min signify possible Chronic Kidney Disease.    Anion gap 8 5 - 15  Glucose, capillary     Status: Abnormal   Collection Time: 03/21/17 11:48 AM  Result Value Ref Range   Glucose-Capillary 129 (H) 65 - 99 mg/dL  Glucose, capillary     Status: Abnormal   Collection Time: 03/21/17  4:42 PM  Result Value Ref Range   Glucose-Capillary 130 (H) 65 - 99 mg/dL  Glucose, capillary     Status: Abnormal   Collection Time: 03/21/17  8:49 PM  Result Value Ref Range   Glucose-Capillary 116 (H) 65 - 99 mg/dL  Glucose, capillary     Status: Abnormal   Collection Time: 03/22/17  6:51 AM  Result Value Ref Range   Glucose-Capillary 103 (H) 65 - 99 mg/dL  CBC     Status: Abnormal   Collection Time: 03/22/17  7:21 AM  Result Value Ref Range   WBC 12.2 (H) 4.0 - 10.5 K/uL   RBC 3.99 (L) 4.22 - 5.81 MIL/uL   Hemoglobin 12.3 (L) 13.0 - 17.0 g/dL   HCT 37.9 (L) 39.0 - 52.0 %   MCV 95.0 78.0 - 100.0 fL   MCH 30.8 26.0 - 34.0 pg   MCHC 32.5 30.0 - 36.0 g/dL   RDW 14.5 11.5 - 15.5 %   Platelets 207 150 - 400 K/uL     HEENT: normal Cardio: RRR and no murmur Resp: CTA B/L and unlabored, reduced BS in bases GI: BS positive and mild distention, inconsistent tenderness RUQ, after palpation of entire abd the RUQ was re examined and not tender Extremity:  Pulses positive and No Edema Skin:   Intact Neuro: Alert/Oriented and Abnormal Motor 4/5 In BUE and BLE Musc/Skel:  Other no pain with UE or LE ROM Gen NAD   Assessment/Plan: 1. Functional deficits secondary to Debility due to  respiratory failure which require 3+ hours per day of interdisciplinary therapy in a comprehensive inpatient rehab setting. Physiatrist is providing close team supervision and 24 hour management of active medical problems listed below. Physiatrist and rehab team continue to assess barriers to discharge/monitor patient progress toward functional and medical goals. FIM: Function - Bathing Position: Shower Body parts bathed by patient: Right arm, Left arm, Right upper leg, Left upper leg, Chest, Abdomen, Front perineal area, Right lower leg, Left lower leg, Back Body parts bathed by helper: Buttocks Assist Level:  (LH sponge)  Function- Upper Body Dressing/Undressing What is the patient wearing?: Pull over shirt/dress Pull over shirt/dress - Perfomed by patient: Thread/unthread right sleeve, Thread/unthread left sleeve, Put head through opening, Pull shirt over trunk Assist Level: More than reasonable time Set up : To obtain clothing/put away Function - Lower Body Dressing/Undressing What is the patient wearing?: Underwear, Pants, Non-skid slipper socks Position: Wheelchair/chair at sink Underwear - Performed by patient: Thread/unthread right underwear leg, Thread/unthread left underwear leg, Pull underwear up/down Pants- Performed by patient: Thread/unthread right pants leg, Thread/unthread left pants leg, Pull pants up/down, Fasten/unfasten pants Non-skid slipper socks- Performed by patient: Don/doff right sock, Don/doff left sock (Sock aid) Socks - Performed by patient: Don/doff right sock, Don/doff left sock Socks - Performed by helper: Don/doff right sock, Don/doff left sock Shoes - Performed by helper: Don/doff right shoe, Don/doff left shoe Assist for footwear: Supervision/touching  assist Assist for lower body dressing: Supervision or verbal cues  Function - Toileting Toileting steps completed by patient: Adjust clothing prior to toileting Toileting steps completed by helper:  Performs perineal hygiene, Adjust clothing after toileting Toileting Assistive Devices: Grab bar or rail Assist level: Touching or steadying assistance (Pt.75%)  Function - Toilet Transfers Toilet transfer assistive device: Elevated toilet seat/BSC over toilet, Grab bar Assist level to toilet: Supervision or verbal cues Assist level from toilet: Supervision or verbal cues  Function - Chair/bed transfer Chair/bed transfer method: Stand pivot Chair/bed transfer assist level: Supervision or verbal cues Chair/bed transfer assistive device: Armrests, Walker  Function - Locomotion: Wheelchair Will patient use wheelchair at discharge?: No Type: Manual Max wheelchair distance: 126f  Assist Level: Supervision or verbal cues Assist Level: Supervision or verbal cues Wheel 150 feet activity did not occur: Safety/medical concerns Assist Level: Supervision or verbal cues Turns around,maneuvers to table,bed, and toilet,negotiates 3% grade,maneuvers on rugs and over doorsills: No Function - Locomotion: Ambulation Assistive device: Walker-rolling Max distance: 102f Assist level: Supervision or verbal cues Assist level: Supervision or verbal cues Walk 50 feet with 2 turns activity did not occur: Safety/medical concerns Assist level: Supervision or verbal cues Walk 10 feet on uneven surfaces activity did not occur: Safety/medical concerns  Function - Comprehension Comprehension: Auditory Comprehension assist level: Follows basic conversation/direction with no assist  Function - Expression Expression: Verbal Expression assist level: Expresses basic needs/ideas: With no assist  Function - Social Interaction Social Interaction assist level: Interacts appropriately 75 - 89% of the time - Needs redirection for appropriate language or to initiate interaction.  Function - Problem Solving Problem solving assist level: Solves complex problems: With extra time  Function - Memory Memory assist  level: More than reasonable amount of time  Medical Problem List and Plan: 1. Decreased functional mobility with debilitationsecondary to acute hypoxic respiratory failure/Acute encephalopathy -PT, OT, SLP cont CIR 2. DVT Prophylaxis/Anticoagulation: SQ heparin.Check vascular study 3. Pain Management: Tylenol as needed, morphine and demerol allergy , trial tramadol, may need toradol 4. Mood: Provide emotional support, neuropsych eval 5. Neuropsych: This patient iscapable of making decisions on hisown behalf. 6. Skin/Wound Care: Routine skin checks 7. Fluids/Electrolytes/Nutrition: Routine I&O with follow up chemestries 8.Morbid obesity. BMI 45.9 KG. Dietary follow-up 9.Diabetes mellitus with peripheral neuropathy.Hemoglobin A1c 8.2. Lantus insulin 10 units daily.Check blood sugars before meals and at bedtime, good control CBG (last 3)   Recent Labs  03/21/17 1642 03/21/17 2049 03/22/17 0651  GLUCAP 130* 116* 103*    10.COPD. Continue nebulizer treatments check oxygen saturations every shift -continue on 2L oxygen via  for now 11.Hypertension.Lopressor 37.5 mg twice a day.Monitor with increased mobility 12.PAF.Cardiac rate controlled. 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 Monitor for any signs of fluid overload 14.Hypokalemia. Follow-up K+ 3.3 , supplement 15.AKI.Creatinine stabilized. Follow-up chemistries creat 1.23 16.Medical noncompliance. Counseling 17.  RUQ pain not consistent- will check gallbladder ultrasound, has hx of chronic ileus off reglan due to freq stool,   Start probiotic due to recent abx LOS (Days) 5 A FACE TO FACE EVALUATION WAS PERFORMED  Flynt Breeze E 03/22/2017, 7:52 AM

## 2017-03-23 ENCOUNTER — Inpatient Hospital Stay (HOSPITAL_COMMUNITY): Payer: BLUE CROSS/BLUE SHIELD | Admitting: Physical Therapy

## 2017-03-23 ENCOUNTER — Inpatient Hospital Stay (HOSPITAL_COMMUNITY): Payer: BLUE CROSS/BLUE SHIELD | Admitting: Occupational Therapy

## 2017-03-23 ENCOUNTER — Inpatient Hospital Stay (HOSPITAL_COMMUNITY): Payer: BLUE CROSS/BLUE SHIELD

## 2017-03-23 LAB — CBC
HCT: 37.8 % — ABNORMAL LOW (ref 39.0–52.0)
Hemoglobin: 11.8 g/dL — ABNORMAL LOW (ref 13.0–17.0)
MCH: 30 pg (ref 26.0–34.0)
MCHC: 31.2 g/dL (ref 30.0–36.0)
MCV: 96.2 fL (ref 78.0–100.0)
PLATELETS: 205 10*3/uL (ref 150–400)
RBC: 3.93 MIL/uL — ABNORMAL LOW (ref 4.22–5.81)
RDW: 14.7 % (ref 11.5–15.5)
WBC: 9.9 10*3/uL (ref 4.0–10.5)

## 2017-03-23 LAB — COMPREHENSIVE METABOLIC PANEL
ALBUMIN: 2.9 g/dL — AB (ref 3.5–5.0)
ALT: 41 U/L (ref 17–63)
AST: 26 U/L (ref 15–41)
Alkaline Phosphatase: 128 U/L — ABNORMAL HIGH (ref 38–126)
Anion gap: 11 (ref 5–15)
BUN: 19 mg/dL (ref 6–20)
CHLORIDE: 101 mmol/L (ref 101–111)
CO2: 24 mmol/L (ref 22–32)
Calcium: 8.6 mg/dL — ABNORMAL LOW (ref 8.9–10.3)
Creatinine, Ser: 1.34 mg/dL — ABNORMAL HIGH (ref 0.61–1.24)
GFR calc Af Amer: >60 mL/min (ref >60)
GFR calc non Af Amer: >60 mL/min (ref >60)
GLUCOSE: 138 mg/dL — AB (ref 65–99)
POTASSIUM: 3.9 mmol/L (ref 3.5–5.1)
Sodium: 136 mmol/L (ref 135–145)
Total Bilirubin: 0.8 mg/dL (ref 0.3–1.2)
Total Protein: 7.8 g/dL (ref 6.5–8.1)

## 2017-03-23 LAB — GLUCOSE, CAPILLARY
Glucose-Capillary: 141 mg/dL — ABNORMAL HIGH (ref 65–99)
Glucose-Capillary: 122 mg/dL — ABNORMAL HIGH (ref 65–99)

## 2017-03-23 MED ORDER — METOCLOPRAMIDE HCL 5 MG PO TABS: 5.0000 mg | ORAL_TABLET | Freq: Three times a day (TID) | ORAL | 0 refills | Status: DC

## 2017-03-23 MED ORDER — SACCHAROMYCES BOULARDII 250 MG PO CAPS: 250.0000 mg | ORAL_CAPSULE | Freq: Two times a day (BID) | ORAL | 0 refills | Status: DC

## 2017-03-23 MED ORDER — METOPROLOL TARTRATE 37.5 MG PO TABS: 37.5000 mg | ORAL_TABLET | Freq: Two times a day (BID) | ORAL | 0 refills | Status: DC

## 2017-03-23 MED ORDER — INSULIN GLARGINE 100 UNIT/ML ~~LOC~~ SOLN: 10.0000 [IU] | Freq: Every day | SUBCUTANEOUS | 11 refills | Status: DC

## 2017-03-23 MED ORDER — ACCU-CHEK SAFE-T PRO LANCETS MISC: 12 refills | Status: DC

## 2017-03-23 MED ORDER — HYDRALAZINE HCL 25 MG PO TABS: 25.0000 mg | ORAL_TABLET | Freq: Three times a day (TID) | ORAL | 1 refills | Status: DC

## 2017-03-23 MED ORDER — LIDOCAINE 5 % EX PTCH: 1.0000 | MEDICATED_PATCH | CUTANEOUS | 0 refills | Status: DC

## 2017-03-23 MED ORDER — HYDROCODONE-ACETAMINOPHEN 5-325 MG PO TABS: 1.0000 | ORAL_TABLET | Freq: Four times a day (QID) | ORAL | 0 refills | Status: DC | PRN

## 2017-03-23 MED ORDER — BLOOD GLUCOSE MONITOR KIT: PACK | 0 refills | Status: DC

## 2017-03-23 MED ORDER — ALBUTEROL SULFATE HFA 108 (90 BASE) MCG/ACT IN AERS: 2.0000 | INHALATION_SPRAY | RESPIRATORY_TRACT | 0 refills | Status: DC | PRN

## 2017-03-23 MED ORDER — CARVEDILOL 6.25 MG PO TABS: 6.2500 mg | ORAL_TABLET | Freq: Two times a day (BID) | ORAL | 3 refills | Status: DC

## 2017-03-23 MED ORDER — INSULIN GLARGINE 100 UNITS/ML SOLOSTAR PEN: 10.0000 [IU] | PEN_INJECTOR | Freq: Every day | SUBCUTANEOUS | 11 refills | Status: DC

## 2017-03-23 MED ORDER — PANTOPRAZOLE SODIUM 40 MG PO TBEC: 40.0000 mg | DELAYED_RELEASE_TABLET | Freq: Every day | ORAL | 0 refills | Status: DC

## 2017-03-23 MED ORDER — FLUTICASONE PROPIONATE 50 MCG/ACT NA SUSP: 2.0000 | Freq: Every day | NASAL | 3 refills | Status: DC

## 2017-03-23 MED ORDER — LOSARTAN POTASSIUM 25 MG PO TABS
25.0000 mg | ORAL_TABLET | Freq: Every day | ORAL | 3 refills | Status: DC
Start: 2017-03-23 — End: 2017-03-26

## 2017-03-23 MED ORDER — TRAMADOL HCL 50 MG PO TABS: 100.0000 mg | ORAL_TABLET | Freq: Four times a day (QID) | ORAL | 0 refills | Status: DC | PRN

## 2017-03-23 NOTE — Progress Notes (Signed)
Social Work  Discharge Note  The overall goal for the admission was met for:   Discharge location: Yes-HOME WITH MOM AND THREE GRANDCHILDREN  Length of Stay: Yes-6 DAYS  Discharge activity level: Yes-MOD/I LEVEL  Home/community participation: Yes  Services provided included: MD, RD, PT, OT, SLP, RN, CM, Pharmacy and SW  Financial Services: Private Insurance: [employer]  Follow-up services arranged: Home Health: [facility], DME: [facility] and Patient/Family has no preference for HH/DME agencies  Comments (or additional information):PT PROGRESSED QUICKLY AND WANTED TO GO HOME ASAP. HIS MOM WILL BE AT HOME WITH HIM AND CAN PROVIDE SUPERVISION LEVEL. OBTAINED A NEW PCP-GREG CALONE-NP FOR PT-APPOINTMENT 6/19 @ 9:00 AM. PT PLANS TO BE COMPLIANT AND CONTINUE HIS RECOVERY. SISTER HERE TO GO OVER DISCHARGE INSTRUCTIONS AND ANSWERED HER QUESTIONS. BOTH FEEL READY FOR DC TODAY.  Patient/Family verbalized understanding of follow-up arrangements: Yes  Individual responsible for coordination of the follow-up plan: SELF & [name]  Confirmed correct DME delivered: Elease Hashimoto 03/23/2017    Elease Hashimoto

## 2017-03-23 NOTE — Discharge Summary (Signed)
Discharge summary job [phone]

## 2017-03-23 NOTE — Progress Notes (Signed)
Physical Therapy Session Note  Patient Details  Name: Luis [name] MRN: [id number] Date of Birth: [birth year]  Today's Date: 03/23/2017 PT Individual Time: 2241-1464 PT Individual Time Calculation (min): 60 min   Short Term Goals: Week 1:  PT Short Term Goal 1 (Week 1): STG=LTG due to ELOS  Skilled Therapeutic Interventions/Progress Updates: Pt presented sitting EOB agreeable to therapy. Pt transported to ortho gym for energy conservation. Performed functional activities including car transfer stairs, gait up to 120f with RW, bed mobility, and  basic activities in kitchen at supervision or mod I level. Pt required additional time due to decreased endurance and slow recovery time. Reinforced PLB and to be aware of pacing with all activities. Pt returned to room in same manner as prior and remained in w/c at end of session with needs met.   Tx2: Pt not seen as d/c prior to second treatment session.     Therapy Documentation Precautions:  Precautions Precautions: Fall Restrictions Weight Bearing Restrictions: No   See Function Navigator for Current Functional Status.   Therapy/Group: Individual Therapy  Jenney Brester  Cyndie Woodbeck, PTA  03/23/2017, 1:01 PM

## 2017-03-23 NOTE — Progress Notes (Signed)
Physical Therapy Discharge Summary  Patient Details  Name: Luis [name] MRN: [id number] Date of Birth: 23-Nov-1965  Today's Date: 03/23/2017   Patient has met 9 of 9 long term goals due to improved activity tolerance, improved balance, increased strength and improved coordination.  Pt has made improvements through course of therapy. He has been educated on pacing with activities and PLB techniques. Patient to discharge at an ambulatory level Modified Independent.   Patient's care partner is independent to provide the necessary physical assistance at discharge.  Reasons goals not met: N/A  Recommendation:  Patient will benefit from ongoing skilled PT services in home health setting to continue to advance safe functional mobility, address ongoing impairments in gait, balance, endurance, edema, functional mobility, and minimize fall risk.  Equipment: bariatric RW  Reasons for discharge: treatment goals met  Patient/family agrees with progress made and goals achieved: Yes  PT Discharge   Motor  Motor Motor: Other (comment) Motor - Discharge Observations: Generalized weakness/ deconditioning, though much improved since admission  Mobility Bed Mobility Bed Mobility: Rolling Right;Rolling Left;Supine to Sit;Sit to Supine Rolling Right: 6: Modified independent (Device/Increase time) Rolling Left: 6: Modified independent (Device/Increase time) Supine to Sit: 6: Modified independent (Device/Increase time) Sit to Supine: 6: Modified independent (Device/Increase time) Transfers Transfers: Yes Sit to Stand: 6: Modified independent (Device/Increase time) Stand Pivot Transfers: 6: Modified independent (Device/Increase time) Locomotion  Ambulation Ambulation: Yes Ambulation/Gait Assistance: 6: Modified independent (Device/Increase time) Ambulation Distance (Feet): 120 Feet Assistive device: Rolling walker Gait Gait: Yes Gait Pattern: Impaired Gait Pattern: Shuffle;Decreased step  length - left;Decreased stance time - right Stairs / Additional Locomotion Stairs: Yes Stairs Assistance: 5: Supervision Stairs Assistance Details: Verbal cues for sequencing Stair Management Technique: Two rails Number of Stairs: 8 Height of Stairs: 6 Wheelchair Mobility Wheelchair Mobility: No  Trunk/Postural Assessment  Cervical Assessment Cervical Assessment: Within Functional Limits Thoracic Assessment Thoracic Assessment: Exceptions to [facility] Lumbar Assessment Lumbar Assessment: Exceptions to [facility] Postural Control Postural Control: Within Functional Limits  Balance Balance Balance Assessed: Yes Dynamic Sitting Balance Dynamic Sitting - Balance Support: During functional activity;Feet supported Dynamic Sitting - Level of Assistance: 6: Modified independent (Device/Increase time) Static Standing Balance Static Standing - Balance Support: During functional activity Static Standing - Level of Assistance: 6: Modified independent (Device/Increase time) Static Standing - Comment/# of Minutes: reaching for objects in kitchen  Dynamic Standing Balance Dynamic Standing - Balance Support: During functional activity;Right upper extremity supported;Left upper extremity supported Dynamic Standing - Level of Assistance: 6: Modified independent (Device/Increase time) Dynamic Standing - Comments: gait  Extremity Assessment      RLE Assessment RLE Assessment: Exceptions to [facility] LLE Assessment LLE Assessment: Exceptions to [facility] Generalized deconditioning and increase edema in BLE  See Function Navigator for Current Functional Status.  Rosita DeChalus 03/23/2017, 5:21 PM   Lars Masson, PT, DPT 03/24/17 8:37 PM

## 2017-03-23 NOTE — Progress Notes (Signed)
Social Work Patient ID: Luis [name], male   DOB: 22-Jan-1966, 51 y.o.   MRN: [id number]  Pt requests to go home today and MD is in agreement with this. Pt and team aware. Make sure his equipment and follow up home health agency aware and work on discharge for later today. Pt has no preference for new PCP, just wants this worker to find one for him.

## 2017-03-23 NOTE — Discharge Summary (Signed)
Luis [name], Luis [name]              ACCOUNT NO.:  [id number]  MEDICAL RECORD NO.:  [id number]  LOCATION:  4W23C                        FACILITY:  [facility]  PHYSICIAN:  Luis [name], M.D.DATE OF BIRTH:  [birth year]  DATE OF ADMISSION:  03/17/2017 DATE OF DISCHARGE:  03/23/2017                              DISCHARGE SUMMARY   DISCHARGE DIAGNOSES: 1. Debilitation secondary to acute hypoxic respiratory failure. 2. Subcutaneous heparin for deep venous thrombosis prophylaxis. 3. Pain management. 4. Morbid obesity. 5. Diabetes mellitus. 6. Right-sided abdominal pain. 7. Chronic obstructive pulmonary disease. 8. Hypertension. 9. Paroxysmal atrial fibrillation. 10.Combined diastolic/systolic congestive heart failure. 11.Hypokalemia, resolved. 12.Acute kidney injury, creatinine stabilized. 13.Medical noncompliance.  HISTORY OF PRESENT ILLNESS:  This is a 51 year old, right-handed male with history of COPD, tobacco abuse, atrial fibrillation, congestive heart failure, morbid obesity, diabetes mellitus, hypertension, and noncompliance for the past several months, on no prescription medications upon admission, independent prior to admission.  Presented March 01, 2017, with productive cough, myalgias as well as increased lower extremity edema, oxygen saturations 84% on room air.  Chest x-ray showed bilateral infiltrates, diffuse patchy bilateral infiltrates, cardiomegaly, concerning for pulmonary edema.  Hyponatremia, 132. Troponin negative.  Urine drug screen negative.  Required intubation. Placed on broad-spectrum antibiotics.  Maintained on IV diuresis. Echocardiogram with ejection fraction of 55%, no wall motion abnormalities, grade 1 diastolic dysfunction.  Antibiotic therapy completed.  AKI, 2.6, improved with gentle hydration, latest creatinine 1.28.  Subcutaneous heparin for DVT prophylaxis.  The patient was admitted for comprehensive rehab program.  PAST MEDICAL HISTORY:  See  discharge diagnoses.  SOCIAL HISTORY:  The patient independent prior to admission.  One-level home.  Family in area with good support.  FUNCTIONAL STATUS:  Upon admission to rehab services, was moderate assist, sit to stand, minimal assist sit to supine, mod-to-max assist activities of daily living.  PHYSICAL EXAMINATION:  VITAL SIGNS:  Blood pressure 161/99, pulse 81, temperature 97, respirations 18. GENERAL:  Alert male, in no acute distress.  Followed full commands. Reasonable insight to deficits.  EOMs intact. NECK:  Supple.  Nontender.  No JVD. CARDIAC:  Regular rate and rhythm. ABDOMEN:  Soft, nontender.  Good bowel sounds. LUNGS:  Clear to auscultation without wheeze.  The patient is moving all extremities.  REHABILITATION HOSPITAL COURSE:  The patient was admitted to Inpatient Rehab Services with therapies initiated on a 3-hour daily basis, consisting of physical therapy, occupational therapy, speech therapy, and rehabilitation nursing.  The following issues were addressed during the patient's rehabilitation stay.  Pertaining to [name]' acute hypoxic respiratory failure, remained on inhalers, oxygen saturations greater than 95% on room air.  Subcutaneous heparin for DVT prophylaxis. Arrangements made to follow up outpatient Pulmonary Services for sleep study, ongoing care of COPD.  Blood pressures, heart rate well controlled.  History of PAF.  He continued on beta-blocker.  Bouts of hypokalemia resolved with potassium supplement.  His creatinine had stabilized at 1.34 and monitored.  History of diabetes mellitus, peripheral neuropathy with medical noncompliance, hemoglobin A1c of 8.2. He remained on insulin therapy, full diabetic teaching.  Morbid obesity, BMI of 45.9 kg.  Dietary followup for weight loss diet.  In regard to the patient's  medical noncompliance, again received full counseling in regard to maintaining medical regimen.  During his hospital course  on rehab, he did have some right-sided abdominal pain, etiology unclear. Ultrasound right upper quadrant unremarkable.  He remained afebrile. Bowel program well established.  Abdominal pain did improve.  The patient received weekly collaborative interdisciplinary team conferences to discuss estimated length of stay, family teaching, any barriers to discharge.  The patient encouraged ambulation, needing some encouragement at times to participate.  Agreeable to ambulate, transfer to wheelchair, bed, rolling walker supervision assist.  Gather belongings for activities of daily living and homemaking.  Again, needing encouragement ongoing to participate at times.  Full family teaching was completed and plan discharge home.  DISCHARGE MEDICATIONS: 1. Tylenol as needed. 2. Lantus insulin 10 units subcutaneous daily. 3. Lopressor 37.5 mg p.o. b.i.d. 4. Protonix 40 mg p.o. daily. 5. Florastor 250 mg p.o. b.i.d. 6. Ultram 100 mg every 6 hours as needed. 7. Lidoderm patch, change as directed. 8. Continue inhalers.  DIET:  Low-sodium.  FOLLOWUP:  He would [name]; Dr. Jamison [name] of Pulmonary Services; Dr. Lerry [name] of Medical Management.     Luis [name], P.A.   ______________________________ Luis [name], M.D.    DA/MEDQ  D:  03/23/2017  T:  03/23/2017  Job:  [id number]  cc:   Luis Liner, MD Luis [name], M.D. Luis Cousins, MD

## 2017-03-23 NOTE — Progress Notes (Signed)
Physical Therapy Session Note  Patient Details  Name: Luis [name] MRN: [id number] Date of Birth: 1966-09-10  Today's Date: 03/23/2017 PT Individual Time: 1300-1309 PT Individual Time Calculation (min): 9 min   Short Term Goals: Week 1:  PT Short Term Goal 1 (Week 1): STG=LTG due to ELOS  Skilled Therapeutic Interventions/Progress Updates:   Pt expressed that he was waiting for his ride to d/c and not planning to do any further therapies today. Pt with questions about edema in BLE and educated on positioning and elevation. Also issued handout for BLE strengthening HEP to address strength and balance. Pt declined practicing these, but agreeable to verbalizing through exercises. PA arrived for discharge instructions as pt's ride arrived. Pt refused rest of session.   Therapy Documentation Precautions:  Precautions Precautions: Fall Restrictions Weight Bearing Restrictions: No General: PT Amount of Missed Time (min): 36 Minutes PT Missed Treatment Reason: Patient unwilling to participate   Pain:  No complaints.  See Function Navigator for Current Functional Status.   Therapy/Group: Individual Therapy  Karolee Stamps Darrol Poke, PT, DPT  03/23/2017, 3:29 PM

## 2017-03-23 NOTE — Progress Notes (Addendum)
Subjective/Complaints:  Abd feels better passed a lot of gas, wants to go home today  ROS- neg N/V/D, neg CP or SOB  Objective: Vital Signs: Blood pressure 117/78, pulse 99, temperature 98.6 F (37 C), temperature source Oral, resp. rate 18, height '6\' 3"'$  (1.905 m), weight (!) 153.8 kg (339 lb 1.1 oz), SpO2 95 %. Dg Chest 2 View  Result Date: 03/21/2017 CLINICAL DATA:  Atrial fibrillation.  Pneumonia. EXAM: CHEST  2 VIEW COMPARISON:  None. FINDINGS: The study is limited due to patient body habitus. No pneumothorax. There may be increased opacity in the retrocardiac region based on the lateral view. Cardiomegaly. No overt edema. No nodule, mass, or other acute abnormality. IMPRESSION: The study is limited due to patient body habitus. Infiltrate in the left retrocardiac region not excluded based on the lateral view. Recommend clinical correlation and attention on follow-up. Electronically Signed   By: Dorise Bullion III M.D   On: 03/21/2017 12:56   Results for orders placed or performed during the hospital encounter of 03/17/17 (from the past 72 hour(s))  Glucose, capillary     Status: Abnormal   Collection Time: 03/20/17 11:34 AM  Result Value Ref Range   Glucose-Capillary 107 (H) 65 - 99 mg/dL  Glucose, capillary     Status: Abnormal   Collection Time: 03/20/17  4:50 PM  Result Value Ref Range   Glucose-Capillary 112 (H) 65 - 99 mg/dL  Glucose, capillary     Status: Abnormal   Collection Time: 03/20/17  8:57 PM  Result Value Ref Range   Glucose-Capillary 136 (H) 65 - 99 mg/dL  Glucose, capillary     Status: Abnormal   Collection Time: 03/21/17  6:31 AM  Result Value Ref Range   Glucose-Capillary 141 (H) 65 - 99 mg/dL  Urinalysis, Routine w reflex microscopic     Status: Abnormal   Collection Time: 03/21/17  8:15 AM  Result Value Ref Range   Color, Urine AMBER (A) YELLOW    Comment: BIOCHEMICALS MAY BE AFFECTED BY COLOR   APPearance CLEAR CLEAR   Specific Gravity, Urine 1.021  1.005 - 1.030   pH 5.0 5.0 - 8.0   Glucose, UA NEGATIVE NEGATIVE mg/dL   Hgb urine dipstick NEGATIVE NEGATIVE   Bilirubin Urine NEGATIVE NEGATIVE   Ketones, ur NEGATIVE NEGATIVE mg/dL   Protein, ur 30 (A) NEGATIVE mg/dL   Nitrite NEGATIVE NEGATIVE   Leukocytes, UA NEGATIVE NEGATIVE   RBC / HPF 0-5 0 - 5 RBC/hpf   WBC, UA 0-5 0 - 5 WBC/hpf   Bacteria, UA NONE SEEN NONE SEEN   Squamous Epithelial / LPF NONE SEEN NONE SEEN   Mucous PRESENT    Hyaline Casts, UA PRESENT   Urine culture     Status: Abnormal   Collection Time: 03/21/17  8:15 AM  Result Value Ref Range   Specimen Description URINE, CLEAN CATCH    Special Requests NONE    Culture <10,000 COLONIES/mL INSIGNIFICANT GROWTH (A)    Report Status 03/22/2017 FINAL   CBC with Differential/Platelet     Status: Abnormal   Collection Time: 03/21/17  8:52 AM  Result Value Ref Range   WBC 11.4 (H) 4.0 - 10.5 K/uL   RBC 3.99 (L) 4.22 - 5.81 MIL/uL   Hemoglobin 12.1 (L) 13.0 - 17.0 g/dL   HCT 37.8 (L) 39.0 - 52.0 %   MCV 94.7 78.0 - 100.0 fL   MCH 30.3 26.0 - 34.0 pg   MCHC 32.0 30.0 - 36.0  g/dL   RDW 80.3 21.6 - 16.3 %   Platelets 227 150 - 400 K/uL   Neutrophils Relative % 67 %   Neutro Abs 7.7 1.7 - 7.7 K/uL   Lymphocytes Relative 22 %   Lymphs Abs 2.5 0.7 - 4.0 K/uL   Monocytes Relative 10 %   Monocytes Absolute 1.2 (H) 0.1 - 1.0 K/uL   Eosinophils Relative 1 %   Eosinophils Absolute 0.1 0.0 - 0.7 K/uL   Basophils Relative 0 %   Basophils Absolute 0.0 0.0 - 0.1 K/uL  Comprehensive metabolic panel     Status: Abnormal   Collection Time: 03/21/17  8:52 AM  Result Value Ref Range   Sodium 135 135 - 145 mmol/L   Potassium 3.6 3.5 - 5.1 mmol/L   Chloride 106 101 - 111 mmol/L   CO2 21 (L) 22 - 32 mmol/L   Glucose, Bld 143 (H) 65 - 99 mg/dL   BUN 11 6 - 20 mg/dL   Creatinine, Ser 2.82 0.61 - 1.24 mg/dL   Calcium 8.5 (L) 8.9 - 10.3 mg/dL   Total Protein 7.4 6.5 - 8.1 g/dL   Albumin 3.0 (L) 3.5 - 5.0 g/dL   AST 19 15 - 41  U/L   ALT 33 17 - 63 U/L   Alkaline Phosphatase 89 38 - 126 U/L   Total Bilirubin 0.9 0.3 - 1.2 mg/dL   GFR calc non Af Amer >60 >60 mL/min   GFR calc Af Amer >60 >60 mL/min    Comment: (NOTE) The eGFR has been calculated using the CKD EPI equation. This calculation has not been validated in all clinical situations. eGFR's persistently <60 mL/min signify possible Chronic Kidney Disease.    Anion gap 8 5 - 15  Glucose, capillary     Status: Abnormal   Collection Time: 03/21/17 11:48 AM  Result Value Ref Range   Glucose-Capillary 129 (H) 65 - 99 mg/dL  Glucose, capillary     Status: Abnormal   Collection Time: 03/21/17  4:42 PM  Result Value Ref Range   Glucose-Capillary 130 (H) 65 - 99 mg/dL  Glucose, capillary     Status: Abnormal   Collection Time: 03/21/17  8:49 PM  Result Value Ref Range   Glucose-Capillary 116 (H) 65 - 99 mg/dL  Glucose, capillary     Status: Abnormal   Collection Time: 03/22/17  6:51 AM  Result Value Ref Range   Glucose-Capillary 103 (H) 65 - 99 mg/dL  CBC     Status: Abnormal   Collection Time: 03/22/17  7:21 AM  Result Value Ref Range   WBC 12.2 (H) 4.0 - 10.5 K/uL   RBC 3.99 (L) 4.22 - 5.81 MIL/uL   Hemoglobin 12.3 (L) 13.0 - 17.0 g/dL   HCT 89.6 (L) 10.3 - 51.1 %   MCV 95.0 78.0 - 100.0 fL   MCH 30.8 26.0 - 34.0 pg   MCHC 32.5 30.0 - 36.0 g/dL   RDW 75.3 07.4 - 13.2 %   Platelets 207 150 - 400 K/uL  Comprehensive metabolic panel     Status: Abnormal   Collection Time: 03/22/17  7:21 AM  Result Value Ref Range   Sodium 134 (L) 135 - 145 mmol/L   Potassium 3.8 3.5 - 5.1 mmol/L   Chloride 103 101 - 111 mmol/L   CO2 20 (L) 22 - 32 mmol/L   Glucose, Bld 125 (H) 65 - 99 mg/dL   BUN 14 6 - 20 mg/dL   Creatinine, Ser  1.15 0.61 - 1.24 mg/dL   Calcium 8.7 (L) 8.9 - 10.3 mg/dL   Total Protein 7.7 6.5 - 8.1 g/dL   Albumin 2.9 (L) 3.5 - 5.0 g/dL   AST 20 15 - 41 U/L   ALT 34 17 - 63 U/L   Alkaline Phosphatase 93 38 - 126 U/L   Total Bilirubin 0.9  0.3 - 1.2 mg/dL   GFR calc non Af Amer >60 >60 mL/min   GFR calc Af Amer >60 >60 mL/min    Comment: (NOTE) The eGFR has been calculated using the CKD EPI equation. This calculation has not been validated in all clinical situations. eGFR's persistently <60 mL/min signify possible Chronic Kidney Disease.    Anion gap 11 5 - 15  Glucose, capillary     Status: Abnormal   Collection Time: 03/22/17 11:53 AM  Result Value Ref Range   Glucose-Capillary 119 (H) 65 - 99 mg/dL  Glucose, capillary     Status: None   Collection Time: 03/22/17  4:42 PM  Result Value Ref Range   Glucose-Capillary 97 65 - 99 mg/dL  Glucose, capillary     Status: Abnormal   Collection Time: 03/22/17  9:15 PM  Result Value Ref Range   Glucose-Capillary 133 (H) 65 - 99 mg/dL  Glucose, capillary     Status: Abnormal   Collection Time: 03/23/17  6:19 AM  Result Value Ref Range   Glucose-Capillary 141 (H) 65 - 99 mg/dL  CBC     Status: Abnormal   Collection Time: 03/23/17  7:39 AM  Result Value Ref Range   WBC 9.9 4.0 - 10.5 K/uL   RBC 3.93 (L) 4.22 - 5.81 MIL/uL   Hemoglobin 11.8 (L) 13.0 - 17.0 g/dL   HCT 37.8 (L) 39.0 - 52.0 %   MCV 96.2 78.0 - 100.0 fL   MCH 30.0 26.0 - 34.0 pg   MCHC 31.2 30.0 - 36.0 g/dL   RDW 14.7 11.5 - 15.5 %   Platelets 205 150 - 400 K/uL  Comprehensive metabolic panel     Status: Abnormal   Collection Time: 03/23/17  7:39 AM  Result Value Ref Range   Sodium 136 135 - 145 mmol/L   Potassium 3.9 3.5 - 5.1 mmol/L   Chloride 101 101 - 111 mmol/L   CO2 24 22 - 32 mmol/L   Glucose, Bld 138 (H) 65 - 99 mg/dL   BUN 19 6 - 20 mg/dL   Creatinine, Ser 1.34 (H) 0.61 - 1.24 mg/dL   Calcium 8.6 (L) 8.9 - 10.3 mg/dL   Total Protein 7.8 6.5 - 8.1 g/dL   Albumin 2.9 (L) 3.5 - 5.0 g/dL   AST 26 15 - 41 U/L   ALT 41 17 - 63 U/L   Alkaline Phosphatase 128 (H) 38 - 126 U/L   Total Bilirubin 0.8 0.3 - 1.2 mg/dL   GFR calc non Af Amer >60 >60 mL/min   GFR calc Af Amer >60 >60 mL/min     Comment: (NOTE) The eGFR has been calculated using the CKD EPI equation. This calculation has not been validated in all clinical situations. eGFR's persistently <60 mL/min signify possible Chronic Kidney Disease.    Anion gap 11 5 - 15     HEENT: normal Cardio: RRR and no murmur Resp: CTA B/L and unlabored, reduced BS in bases GI: BS positive and NT, ND Extremity:  Pulses positive and No Edema Skin:   Intact Neuro: Alert/Oriented and Abnormal Motor 4/5 In BUE  and BLE Musc/Skel:  Other no pain with UE or LE ROM Gen NAD   Assessment/Plan: 1. Functional deficits secondary to Debility due to respiratory failure Stable for D/C today F/u PCP in 3-4 weeks F/u PM&R 2 weeks See D/C summary See D/C instructions FIM: Function - Bathing Position: Shower Body parts bathed by patient: Right arm, Left arm, Right upper leg, Left upper leg, Chest, Abdomen, Front perineal area, Right lower leg, Left lower leg, Back, Buttocks Body parts bathed by helper: Buttocks Assist Level: Supervision or verbal cues  Function- Upper Body Dressing/Undressing What is the patient wearing?: Pull over shirt/dress Pull over shirt/dress - Perfomed by patient: Thread/unthread right sleeve, Thread/unthread left sleeve, Put head through opening, Pull shirt over trunk Assist Level: Set up Set up : To obtain clothing/put away Function - Lower Body Dressing/Undressing What is the patient wearing?: Underwear, Pants, Non-skid slipper socks Position: Wheelchair/chair at sink Underwear - Performed by patient: Thread/unthread right underwear leg, Thread/unthread left underwear leg, Pull underwear up/down Pants- Performed by patient: Thread/unthread right pants leg, Thread/unthread left pants leg, Pull pants up/down, Fasten/unfasten pants Non-skid slipper socks- Performed by patient: Don/doff right sock, Don/doff left sock (Sock aid) Non-skid slipper socks- Performed by helper: Don/doff right sock, Don/doff left  sock Socks - Performed by patient: Don/doff right sock, Don/doff left sock Socks - Performed by helper: Don/doff right sock, Don/doff left sock Shoes - Performed by helper: Don/doff right shoe, Don/doff left shoe Assist for footwear: Dependant Assist for lower body dressing: Touching or steadying assistance (Pt > 75%)  Function - Toileting Toileting steps completed by patient: Adjust clothing prior to toileting Toileting steps completed by helper: Performs perineal hygiene, Adjust clothing after toileting Toileting Assistive Devices: Grab bar or rail Assist level: Touching or steadying assistance (Pt.75%)  Function - Air cabin crew transfer assistive device: Elevated toilet seat/BSC over toilet, Grab bar Assist level to toilet: Supervision or verbal cues Assist level from toilet: Supervision or verbal cues  Function - Chair/bed transfer Chair/bed transfer method: Stand pivot Chair/bed transfer assist level: Supervision or verbal cues Chair/bed transfer assistive device: Armrests, Walker  Function - Locomotion: Wheelchair Will patient use wheelchair at discharge?: No Type: Manual Max wheelchair distance: 153f  Assist Level: Supervision or verbal cues Assist Level: Supervision or verbal cues Wheel 150 feet activity did not occur: Safety/medical concerns Assist Level: Supervision or verbal cues Turns around,maneuvers to table,bed, and toilet,negotiates 3% grade,maneuvers on rugs and over doorsills: No Function - Locomotion: Ambulation Assistive device: Walker-rolling Max distance: 1043f Assist level: Supervision or verbal cues Assist level: Supervision or verbal cues Walk 50 feet with 2 turns activity did not occur: Safety/medical concerns Assist level: Supervision or verbal cues Walk 10 feet on uneven surfaces activity did not occur: Safety/medical concerns  Function - Comprehension Comprehension: Auditory Comprehension assist level: Follows basic  conversation/direction with no assist  Function - Expression Expression: Verbal Expression assist level: Expresses basic needs/ideas: With no assist  Function - Social Interaction Social Interaction assist level: Interacts appropriately 75 - 89% of the time - Needs redirection for appropriate language or to initiate interaction.  Function - Problem Solving Problem solving assist level: Solves complex problems: With extra time  Function - Memory Memory assist level: More than reasonable amount of time  Medical Problem List and Plan: 1. Decreased functional mobility with debilitationsecondary to acute hypoxic respiratory failure/Acute encephalopathy -PT, OT, SLP may d/c today 2. DVT Prophylaxis/Anticoagulation: SQ heparin.Check vascular study 3. Pain Management: Tylenol as needed 4. Mood: Provide  emotional support 5. Neuropsych: This patient iscapable of making decisions on hisown behalf. 6. Skin/Wound Care: Routine skin checks 7. Fluids/Electrolytes/Nutrition: Routine I&O with follow up chemestries 8.Morbid obesity. BMI 45.9 KG. Dietary follow-up 9.Diabetes mellitus with peripheral neuropathy.Hemoglobin A1c 8.2. Lantus insulin 10 units daily.Check blood sugars before meals and at bedtime, good control CBG (last 3)   Recent Labs  03/22/17 1642 03/22/17 2115 03/23/17 0619  GLUCAP 97 133* 141*    10.COPD. Continue nebulizer treatments check oxygen saturations every shift -continue on 2L oxygen via [place] for now 11.Hypertension.Lopressor 37.5 mg twice a day.Monitor with increased mobility 12.PAF.Cardiac rate controlled. 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 Monitor for any signs of fluid overload 14.Hypokalemia. 5/21 K+ 3.9 , supplement 15.AKI.Creatinine stabilized. Follow-up chemistries stable  creat 1.34 16.Medical noncompliance. Counseling  LOS (Days) 6 A FACE TO FACE EVALUATION WAS  PERFORMED  KIRSTEINS,ANDREW E 03/23/2017, 8:36 AM

## 2017-03-23 NOTE — Discharge Instructions (Signed)
Inpatient Rehab Discharge Instructions  Luis [name] Discharge date and time: No discharge date for patient encounter.   Activities/Precautions/ Functional Status: Activity: activity as tolerated Diet: Low-salt diabetic diet Wound Care: none needed Functional status:  ___ No restrictions     ___ Walk up steps independently ___ 24/7 supervision/assistance   ___ Walk up steps with assistance ___ Intermittent supervision/assistance  ___ Bathe/dress independently ___ Walk with walker     _x__ Bathe/dress with assistance ___ Walk Independently    ___ Shower independently ___ Walk with assistance    ___ Shower with assistance ___ No alcohol     ___ Return to work/school ________  Special Instructions: No driving    COMMUNITY REFERRALS UPON DISCHARGE:    Home Health:   PT & RN  Agency:[facility] [phone]   Date of last service:03/23/2017  Medical Equipment/Items Luis [name]  Agency/Supplier:[facility]   [phone]   My questions have been answered and I understand these instructions. I will adhere to these goals and the provided educational materials after my discharge from the hospital.  Patient/Caregiver Signature _______________________________ Date __________  Clinician Signature _______________________________________ Date __________  Please bring this form and your medication list with you to all your follow-up doctor's appointments.

## 2017-03-23 NOTE — Progress Notes (Signed)
Pt discharged to home accompanied by sister. VSS, denies pain at present. Reviewed DM, insulin, diet, s/s hypo and hyper glycemia and insulin pen with pt and sister. Pt returned demo. Pt and sister verbalize understanding of all D/C instructions given by PA-C.

## 2017-03-23 NOTE — Progress Notes (Signed)
Occupational Therapy Session Note  Patient Details  Name: Luis [name] MRN: [id number] Date of Birth: [birth year]  Today's Date: 03/23/2017 OT Individual Time: 4360-6770 OT Individual Time Calculation (min): 60 min    Short Term Goals: Week 1:  OT Short Term Goal 1 (Week 1): STG=LTG due to LOS  Skilled Therapeutic Interventions/Progress Updates:    PT seen for OT ADL bathing/dressing session. Pt sitting up in w/c upon arrival, agreeable to tx session. Reviewed goals of mod I level for d/c, pt in agreement and ready for d/c home after therapy today. He ambulated throughout unit with RW, completed bathing task seated on 3-1 BSC. HE ambulated back out to room to gather clothing items and dressed seated in w/c mod I, used sock aid to assist with LB dressing, TED hose donned total A.. Pt required increased time and rest breaks with all tasks due to decreased functional activity tolerance.  Pt left seated in w/c at end of session, all needs in reach.  Pt made mod I in room in prep for d/c home at end of day, made aware to call for assist if needed.  Educated throughout session regarding importance of incorporating activity into every day tasks, 3:1 hospital stay to recovery ratio, importance of elevation for edema management, and d/c planning.   Therapy Documentation Precautions:  Precautions Precautions: Fall Restrictions Weight Bearing Restrictions: No Pain:   No/ denies pain  See Function Navigator for Current Functional Status.   Therapy/Group: Individual Therapy  Lewis, Trissa Molina C 03/23/2017, 7:12 AM

## 2017-03-23 NOTE — Plan of Care (Signed)
Problem: RH BOWEL ELIMINATION Goal: RH STG MANAGE BOWEL W/MEDICATION W/ASSISTANCE STG Manage Bowel with Medication with mod I  Assistance.  Outcome: Progressing Denies needs for bowel medications  Problem: RH SKIN INTEGRITY Goal: RH STG SKIN FREE OF INFECTION/BREAKDOWN Pt will remain free of skin breakdown while in rehab with mod I assist   Outcome: Progressing No skin breakdown or infection noted  Problem: RH SAFETY Goal: RH STG ADHERE TO SAFETY PRECAUTIONS W/ASSISTANCE/DEVICE STG Adhere to Safety Precautions With Mod I Assistance/Device.  Outcome: Progressing No safety issues noted, patient is sitting in the W/C, refuses to sleep in the bed  Problem: RH PAIN MANAGEMENT Goal: RH STG PAIN MANAGED AT OR BELOW PT'S PAIN GOAL <4 out of 10.   Outcome: Progressing Medicated with Hydrocodone 2 tablets earlier this shift with moderate relief

## 2017-03-23 NOTE — Progress Notes (Signed)
Occupational Therapy Discharge Summary  Patient Details  Name: Sephiroth Mcluckie MRN: [id number] Date of Birth: 02/04/1966   Patient has met 10 of 10 long term goals due to improved activity tolerance, improved balance and postural control.  Patient to discharge at overall Modified Independent level.  Patient to d/c at mod I level. He lives with family members who can provide assist if needed. Reviewed OT/PT goals with pt's sister, she voiced understanding.   Reasons goals not met: Pt completing shower transfers at mod I level. He will use tub transfer bench at d/c. Heavy duty equipment for pt's weight not available at this facility therefore was unable to practice true tub transfer bench transfer. Demonstration and education completed regarding proper technique for transfer, pt voiced understanding and has demonstrated needed skills in order to complete task.   Recommendation:  Patient with no further OT needs. Decreased endurance and standing balance to be addressed by HHPT.   Equipment: Pt to privately purchase heavy duty tub transfer bench. Reviewed this with pt's sister and provided with printed hand out of recommendation.   Reasons for discharge: treatment goals met and discharge from hospital  Patient/family agrees with progress made and goals achieved: Yes  OT Discharge Precautions/Restrictions  Precautions Precautions: Fall Restrictions Weight Bearing Restrictions: No Vision Baseline Vision/History: Wears glasses Wears Glasses: At all times Patient Visual Report: No change from baseline Vision Assessment?: No apparent visual deficits Perception  Perception: Within Functional Limits Praxis Praxis: Intact Cognition Overall Cognitive Status: Within Functional Limits for tasks assessed Arousal/Alertness: Awake/alert Orientation Level: Oriented X4 Memory: Appears intact Awareness: Appears intact Problem Solving: Appears intact Safety/Judgment: Appears  intact Sensation Sensation Light Touch: Appears Intact Proprioception: Appears Intact Coordination Gross Motor Movements are Fluid and Coordinated: Yes Fine Motor Movements are Fluid and Coordinated: Yes Motor  Motor Motor: Other (comment) Motor - Discharge Observations: Generalized weakness/ deconditioning, though much improved since admission Trunk/Postural Assessment  Cervical Assessment Cervical Assessment: Within Functional Limits Thoracic Assessment Thoracic Assessment: Exceptions to [facility] (Rounded shoulders) Lumbar Assessment Lumbar Assessment: Exceptions to [facility] (Posterior pelvic tilt) Postural Control Postural Control: Within Functional Limits  Balance Balance Balance Assessed: Yes Dynamic Sitting Balance Dynamic Sitting - Balance Support: During functional activity;Feet supported Dynamic Sitting - Level of Assistance: 6: Modified independent (Device/Increase time) Static Standing Balance Static Standing - Balance Support: During functional activity Static Standing - Level of Assistance: 6: Modified independent (Device/Increase time) Dynamic Standing Balance Dynamic Standing - Balance Support: During functional activity;Right upper extremity supported;Left upper extremity supported Dynamic Standing - Level of Assistance: 6: Modified independent (Device/Increase time) Dynamic Standing - Comments: Standing to complete LB bathing/dressing and toileting tasks Extremity/Trunk Assessment RUE Assessment RUE Assessment: Within Functional Limits LUE Assessment LUE Assessment: Within Functional Limits   See Function Navigator for Current Functional Status.  Lewis, Selden Noteboom C 03/23/2017, 8:58 AM

## 2017-03-23 NOTE — Progress Notes (Signed)
Pt seen and examined, Discharge plans noted Abd pain resolved after passing much gas, due to resolving Ileus Recommend discharge home on diuretics for chronic diastolic CHF  Zannie Cove, MD

## 2017-03-26 ENCOUNTER — Telehealth: Payer: Self-pay | Admitting: Pulmonary Disease

## 2017-03-26 ENCOUNTER — Ambulatory Visit (INDEPENDENT_AMBULATORY_CARE_PROVIDER_SITE_OTHER): Payer: BLUE CROSS/BLUE SHIELD | Admitting: Cardiovascular Disease

## 2017-03-26 ENCOUNTER — Encounter: Payer: Self-pay | Admitting: Cardiovascular Disease

## 2017-03-26 VITALS — BP 120/60 | HR 86 | Ht 75.0 in | Wt 339.0 lb

## 2017-03-26 DIAGNOSIS — I428 Other cardiomyopathies: Secondary | ICD-10-CM

## 2017-03-26 DIAGNOSIS — I5043 Acute on chronic combined systolic (congestive) and diastolic (congestive) heart failure: Secondary | ICD-10-CM

## 2017-03-26 DIAGNOSIS — I2510 Atherosclerotic heart disease of native coronary artery without angina pectoris: Secondary | ICD-10-CM

## 2017-03-26 DIAGNOSIS — R0602 Shortness of breath: Secondary | ICD-10-CM

## 2017-03-26 LAB — BASIC METABOLIC PANEL
BUN/Creatinine Ratio: 13 (ref 9–20)
BUN: 15 mg/dL (ref 6–24)
CALCIUM: 8.5 mg/dL — AB (ref 8.7–10.2)
CO2: 18 mmol/L (ref 18–29)
Chloride: 103 mmol/L (ref 96–106)
Creatinine, Ser: 1.15 mg/dL (ref 0.76–1.27)
GFR calc Af Amer: 86 mL/min/{1.73_m2} (ref >59)
GFR, EST NON AFRICAN AMERICAN: 74 mL/min/{1.73_m2} (ref >59)
Glucose: 109 mg/dL — ABNORMAL HIGH (ref 65–99)
Potassium: 4.1 mmol/L (ref 3.5–5.2)
Sodium: 138 mmol/L (ref 134–144)

## 2017-03-26 MED ORDER — FUROSEMIDE 40 MG PO TABS: 40.0000 mg | ORAL_TABLET | Freq: Every day | ORAL | 3 refills | Status: DC

## 2017-03-26 NOTE — Patient Instructions (Addendum)
Medication Instructions:  Your physician has recommended you make the following change in your medication:  Start furosemide 40 mg by mouth daily.   Labwork: Lab work to be done today and next Tuesday (5/29)--BMP  Testing/Procedures: none  Follow-Up: Your physician recommends that you schedule a follow-up appointment in:  4 weeks with PA or NP    Any Other Special Instructions Will Be Listed Below (If Applicable).  Appointment with Dr. Vassie Loll is now May 29,2018 at 11:00    If you need a refill on your cardiac medications before your next appointment, please call your pharmacy.

## 2017-03-26 NOTE — Progress Notes (Signed)
Chief Complaint  Patient presents with  . Shortness of Breath   History of Present Illness: 51 yo male with history of HTN, obesity, sleep apnea, DM, medication non-compliance,  tobacco abuse, mild CAD, non-ischemic cardiomyopathy who is here today for follow up. He was admitted to [facility] in May 2017 with pneumonia and had elevated troponin, Echo May 2017 with LVEF=35-40%, global hypokinesis, grade 2 diastolic dysfunction. Stress test was abnormal. Cardiac cath 03/24/17 with mild LAD stenosis. He was admitted to [facility] April 2018 with pneumonia/influenza A, volume overload. He was not taking any of his medications. He was intubated and followed by PCCM. Cardiology was not consulted. Echo May 2018 with normal LV function. He was discharged home without Lasix or scheduled inhalers. He has not been doing well since discharge 3 days ago. His sister has not been able to get him into primary care or Pulmonary this week for f/u so she called our office.   He is here today for follow up. The patient denies any chest pain, palpitations, dizziness, near syncope or syncope. He continues to have dyspnea with minimal exertion. He is relatively immobile.He was not discharged on Lasix or nebulizers. He is not sure how to use his albuterol inhaler. His weight is stable from discharge weight 3 days ago. Review of the PCCM notes during his hospital stay suggest sleep apnea, COPD exacerbation with pneumonia and volume overload.   Primary Care Physician: Golden Circle, FNP  Past Medical History:  Diagnosis Date  . Atrial fibrillation ([place])   . CHF (congestive heart failure) ([place])   . DM (diabetes mellitus) ([place])   . Essential hypertension   . High cholesterol   . History of tobacco abuse   . HTN (hypertension)   . Hyperlipidemia   . Morbid obesity ([place])   . Obesity   . OSA (obstructive sleep apnea)   . Pneumonia ~ 12/2015; 03/19/2016    Past Surgical History:  Procedure Laterality Date  . CARDIAC  CATHETERIZATION N/A 03/24/2016   Procedure: Left Heart Cath and Coronary Angiography;  Surgeon: Leonie Man, MD;  Location: [facility];  Service: Cardiovascular;  Laterality: N/A;  . FEMUR IM NAIL Right 2012  . FRACTURE SURGERY      Current Outpatient Prescriptions  Medication Sig Dispense Refill  . albuterol (PROVENTIL HFA;VENTOLIN HFA) 108 (90 Base) MCG/ACT inhaler Inhale 2 puffs into the lungs every 4 (four) hours as needed for wheezing. 1 Inhaler 0  . aspirin EC 81 MG EC tablet Take 1 tablet (81 mg total) by mouth daily. 30 tablet 0  . blood glucose meter kit and supplies KIT Dispense based on patient and insurance preference. Use up to four times daily as directed. (FOR ICD-9 250.00, 250.01). 1 each 0  . fluticasone (FLONASE) 50 MCG/ACT nasal spray Place 2 sprays into both nostrils daily. 16 g 3  . insulin glargine (LANTUS) 100 UNIT/ML injection Inject 0.1 mLs (10 Units total) into the skin daily. 10 mL 11  . insulin glargine (LANTUS) 100 unit/mL SOPN Inject 0.1 mLs (10 Units total) into the skin at bedtime. 15 mL 11  . Lancets (ACCU-CHEK SAFE-T PRO) lancets Use as instructed 100 each 12  . Metoprolol Tartrate 37.5 MG TABS Take 37.5 mg by mouth 2 (two) times daily. 60 tablet 0  . pantoprazole (PROTONIX) 40 MG tablet Take 1 tablet (40 mg total) by mouth daily. 30 tablet 0  . saccharomyces boulardii (FLORASTOR) 250 MG capsule Take 1 capsule (250 mg total)  by mouth 2 (two) times daily. 60 capsule 0  . traMADol (ULTRAM) 50 MG tablet Take 2 tablets (100 mg total) by mouth every 6 (six) hours as needed for moderate pain. 20 tablet 0  . furosemide (LASIX) 40 MG tablet Take 1 tablet (40 mg total) by mouth daily. 30 tablet 3   No current facility-administered medications for this visit.     Allergies  Allergen Reactions  . Demerol [Meperidine] Itching  . Morphine And Related Itching  . Morphine And Related Itching    Social History   Social History  . Marital status: Single      Spouse name: N/A  . Number of children: 1  . Years of education: some colle   Occupational History  . [employer]    Social History Main Topics  . Smoking status: Former Smoker    Packs/day: 0.50    Years: 30.00    Types: Cigarettes    Quit date: 03/02/2009  . Smokeless tobacco: Never Used  . Alcohol use No  . Drug use: No  . Sexual activity: No   Other Topics Concern  . Not on file   Social History Narrative   ** Merged History Encounter **       Lives in [place] with mother and three grandchildren.   Works for [employer] - works from home.   Does not routinely exercise. Caffeine use: none    Family History  Problem Relation Age of Onset  . Diabetes type II Mother   . Diabetes type II Other   . Other Unknown        never knew father  . Stroke Neg Hx   . Cancer Neg Hx     Review of Systems:  As stated in the HPI and otherwise negative.   BP 120/60   Pulse 86   Ht 6\' 3"  (1.905 m)   Wt (!) 339 lb (153.8 kg)   SpO2 96%   BMI 42.37 kg/m   Physical Examination: General: Well developed, well nourished, NAD  HEENT: OP clear, mucus membranes moist  SKIN: warm, dry. No rashes. Neuro: No focal deficits  Musculoskeletal: Muscle strength 5/5 all ext  Psychiatric: Mood and affect normal  Neck: No JVD, no carotid bruits, no thyromegaly, no lymphadenopathy.  Lungs:Clear bilaterally, no wheezes, rhonci, crackles Cardiovascular: Regular rate and rhythm. No murmurs, gallops or rubs. Abdomen:Soft. Bowel sounds present. Non-tender.  Extremities: No lower extremity edema. Pulses are 2 + in the bilateral DP/PT.  Echo May 2018: Left ventricle: The cavity size was moderately dilated. Wall   thickness was increased in a pattern of moderate LVH. Systolic   function was normal. The estimated ejection fraction was in the   range of 50% to 55%. Wall motion was normal; there were no   regional wall motion abnormalities. Doppler parameters are   consistent with  abnormal left ventricular relaxation (grade 1   diastolic dysfunction). - Left atrium: The atrium was mildly dilated.  Impressions:  - Technically difficult; definity used; normal LV systolic   function; mild diastolic dysfunction; moderate LVH; moderate LVE;   mild LAE.  EKG:  EKG is not ordered today. The ekg ordered today demonstrates   Recent Labs: 03/15/2017: B Natriuretic Peptide 261.0 03/17/2017: Magnesium 2.0 03/23/2017: ALT 41; BUN 19; Creatinine, Ser 1.34; Hemoglobin 11.8; Platelets 205; Potassium 3.9; Sodium 136   Lipid Panel    Component Value Date/Time   CHOL 164 03/24/2016 1108   TRIG 238 (H) 03/12/2017 0502  HDL 23 (L) 03/24/2016 1108   CHOLHDL 7.1 03/24/2016 1108   VLDL 39 03/24/2016 1108   LDLCALC 102 (H) 03/24/2016 1108     Wt Readings from Last 3 Encounters:  03/26/17 (!) 339 lb (153.8 kg)  03/23/17 (!) 339 lb 1.1 oz (153.8 kg)  03/17/17 (!) 338 lb 6.4 oz (153.5 kg)     Other studies Reviewed: Additional studies/ records that were reviewed today include: . Review of the above records demonstrates:   Assessment and Plan:   1. Non-ischemic Cardiomyopathy: LV function normal by echo May 2018. Will continue beta blocker. ARB stopped in the hospital due to renal insufficiency.   2. Chronic combined systolic and diastolic CHF: His weight is unchanged from the last few weeks of his hospitalization. He still has LE edema. I will restart Lasix 40 mg daily. I don't think he is significantly volume overloaded today but this is hard to assess given his size.   3. CAD without angina: He has mild CAD by cath in 2017. Continue ASA, beta blocker. Will consider statin therapy.   4. HTN: BP controlled. No changes.   5. Dyspnea: He has underlying lung disease and was managed during the recent hospitalization by the pulmonary team. Cardiology was not involved. He will need close f/u with the pulmonary team to adjust his medications. He is only on a rescue inhaler. We  have called the Pulmonary office today and his appt has been moved up to early next week. He will go back to the ED if he has clinical changes. He reports to fever, chills. Oxygen sats ok this am on room air.   Current medicines are reviewed at lengtIh with the patient today.  The patient does not have concerns regarding medicines.  The following changes have been made:  no change  Labs/ tests ordered today include:   Orders Placed This Encounter  Procedures  . Basic Metabolic Panel (BMET)  . Basic Metabolic Panel (BMET)   Disposition:   FU with me in 6 weeks  Signed, Lauree Chandler, MD 03/26/2017 1:18 PM    [facility] [address], [place], [place]  [address] Phone: [phone]; Fax: [phone]

## 2017-03-26 NOTE — Telephone Encounter (Signed)
Pt has been scheduled for 03/31/17 @ 11:00p. Apt for 04/03/17 has been canceled.  Pat with Dr. Gibson Ramp office is aware, and will make pt aware as he is in office now.  Nothing further needed.

## 2017-03-31 ENCOUNTER — Other Ambulatory Visit: Payer: BLUE CROSS/BLUE SHIELD

## 2017-03-31 ENCOUNTER — Encounter: Payer: Self-pay | Admitting: Pulmonary Disease

## 2017-03-31 ENCOUNTER — Ambulatory Visit (INDEPENDENT_AMBULATORY_CARE_PROVIDER_SITE_OTHER): Payer: BLUE CROSS/BLUE SHIELD | Admitting: Pulmonary Disease

## 2017-03-31 VITALS — BP 138/90 | HR 92 | Ht 75.0 in | Wt 340.2 lb

## 2017-03-31 DIAGNOSIS — R0602 Shortness of breath: Secondary | ICD-10-CM | POA: Diagnosis not present

## 2017-03-31 DIAGNOSIS — I5032 Chronic diastolic (congestive) heart failure: Secondary | ICD-10-CM | POA: Diagnosis not present

## 2017-03-31 DIAGNOSIS — R0600 Dyspnea, unspecified: Secondary | ICD-10-CM | POA: Diagnosis not present

## 2017-03-31 DIAGNOSIS — I2510 Atherosclerotic heart disease of native coronary artery without angina pectoris: Secondary | ICD-10-CM | POA: Diagnosis not present

## 2017-03-31 DIAGNOSIS — J101 Influenza due to other identified influenza virus with other respiratory manifestations: Secondary | ICD-10-CM | POA: Diagnosis not present

## 2017-03-31 DIAGNOSIS — G4733 Obstructive sleep apnea (adult) (pediatric): Secondary | ICD-10-CM | POA: Insufficient documentation

## 2017-03-31 DIAGNOSIS — I428 Other cardiomyopathies: Secondary | ICD-10-CM | POA: Diagnosis not present

## 2017-03-31 MED ORDER — ALBUTEROL SULFATE (2.5 MG/3ML) 0.083% IN NEBU: 2.5000 mg | INHALATION_SOLUTION | Freq: Four times a day (QID) | RESPIRATORY_TRACT | 4 refills | Status: DC | PRN

## 2017-03-31 NOTE — Assessment & Plan Note (Signed)
Proceed with CPAP titration study

## 2017-03-31 NOTE — Patient Instructions (Addendum)
Breathing test showed ineffective restriction on your lungs Use albuterol 2 puffs every 6 hours as needed  CXR today  Schedule CPAP titration study  Stay on Lasix -aim for weight loss of 5-10 pounds to see if breathing improves  Would suggest extending disability until your primary care evaluation - due to persistent  hoarseness of voice and dyspnea on exertion which is being investigated

## 2017-03-31 NOTE — Assessment & Plan Note (Addendum)
Not clear what is causing his worsening dyspnea. He feels like this is worse than when he was in rehabilitation. Recent CT angina was negative for pulmonary embolism. He does not appear to be in overt heart failure.  I doubt that he has significant COPD at all based on spirometry. He does not seem to desaturate on exertion so does not qualify for oxygen. Clearly significantly deconditioned Given subjective improvement with nebulizer treatments in the hospital, will get him a nebulizer machine at his request and use nebs every 8 hours as needed for dyspnea and wheezing   Would suggest extending disability until your primary care evaluation - due to persistent  hoarseness of voice and dyspnea on exertion which is being investigated

## 2017-03-31 NOTE — Progress Notes (Signed)
Subjective:    Patient ID: Luis [name], male    DOB: 11-May-1966, 51 y.o.   MRN: [id number]  HPI   Chief Complaint  Patient presents with  . Pulm Consult/Hospital Follow-Up    Increased SOB. Was taking 2 breathing treatments a day and sleeping with oxygen at night in the hospital. Was not sent home with any treatments or oxygen orders. SOB from everyday activities.    51 year old morbidly obese man who was recently hospitalized 4/29-5/15 for acute respiratory failure requiring mechanical ventilation due to influenza pneumonia. He also has nonischemic cardiomyopathy with EF of 35-40% with a normal left heart cath in 03/2016 in chronic atrial fibrillation. He required mechanical ventilation from 4/20 925/10 and continues to have a hoarse voice. He had a rehabilitation stay for about a week and improved to where he was ambulating with a walker.  He is accompanied by his sister today and is in a wheelchair. By report, patient's family are very suspicious and medical personnel and have in the past he insisted on discontinuing his mechanical ventilation.  He reports increased dyspnea on exertion since discharge. He was seen by cardiology in 5/24, he reports NYHA class III symptoms but is dyspneic on walking short distances and unusual activity. His weight was 338 pounds at discharge and it is 340 pounds today. He continues to have chronic bipedal edema. He denies cough and sputum production. His sister inquires why he was not discharged on oxygen and breathing medications that he was given in the hospital were discontinued. He is only on albuterol MDI right now. He would also like his disability to be extended. 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 Atrial fibrillation ([facility])   . CHF (congestive heart failure) ([facility])   . DM (diabetes mellitus) ([facility])   . Essential hypertension   . High cholesterol   . History of tobacco abuse   . HTN (hypertension)   . Hyperlipidemia   . Morbid obesity ([facility])   . Obesity   . OSA (obstructive sleep apnea)   . Pneumonia ~ 12/2015; 03/19/2016    Past Surgical History:  Procedure Laterality Date  . CARDIAC CATHETERIZATION N/A 03/24/2016   Procedure: Left Heart Cath and Coronary Angiography;  Surgeon: Marykay Lex, MD;  Location: [facility] INVASIVE CV LAB;  Service: Cardiovascular;  Laterality: N/A;  . FEMUR IM NAIL Right 2012  . FRACTURE SURGERY       Allergies  Allergen Reactions  . Demerol [Meperidine] Itching  . Morphine And Related Itching  . Morphine And Related Itching      Social History   Social History  . Marital status: Single    Spouse name: N/A  . Number of children: 1  . Years of education: some colle   Occupational History  . [employer]    Social History Main Topics  .  Smoking status: Former Smoker    Packs/day: 0.50    Years: 30.00    Types: Cigarettes    Quit date: 03/02/2009  . Smokeless tobacco: Never Used  . Alcohol use No  . Drug use: No  . Sexual activity: No   Other Topics Concern  . Not on file   Social History Narrative   ** Merged History Encounter **       Lives in [place] with mother and three grandchildren.   Works for [employer] - works from home.   Does not routinely exercise. Caffeine use: none     Family History  Problem Relation Age of Onset  . Diabetes type II  Mother   . Diabetes type II Other   . Other Unknown        never knew father  . Stroke Neg Hx   . Cancer Neg Hx     Review of Systems   Constitutional: negative for anorexia, fevers and sweats  Eyes: negative for irritation, redness and visual disturbance  Ears, nose, mouth, throat, and face: negative for earaches, epistaxis, nasal congestion and sore throat  Respiratory: negative for cough,  sputum and wheezing  Cardiovascular: negative for chest pain, dyspnea, lower extremity edema, orthopnea, palpitations and syncope  Gastrointestinal: negative for abdominal pain, constipation, diarrhea, melena, nausea and vomiting  Genitourinary:negative for dysuria, frequency and hematuria  Hematologic/lymphatic: negative for bleeding, easy bruising and lymphadenopathy  Musculoskeletal:negative for arthralgias, muscle weakness and stiff joints  Neurological: negative for coordination problems, gait problems, headaches and weakness  Endocrine: negative for diabetic symptoms including polydipsia, polyuria and weight loss     Objective:   Physical Exam  Gen. Pleasant, obese, in no distress, normal affect ENT - no lesions, no post nasal drip, class 2-3 airway Neck: No JVD, no thyromegaly, no carotid bruits Lungs: no use of accessory muscles, no dullness to percussion, decreased without rales or rhonchi  Cardiovascular: Rhythm regular, heart sounds  normal, no murmurs or gallops, 2+ peripheral edema Abdomen: soft and non-tender, no hepatosplenomegaly, BS normal. Musculoskeletal: No deformities, no cyanosis or clubbing Neuro:  alert, non focal, no tremors        Assessment & Plan:

## 2017-03-31 NOTE — Assessment & Plan Note (Signed)
Continue Lasix and aim for negative balance at least 5-10 pounds over the next 2 weeks

## 2017-03-31 NOTE — Assessment & Plan Note (Signed)
Chest x-ray today to ensure that infiltrates have resolved

## 2017-04-01 LAB — BASIC METABOLIC PANEL
BUN/Creatinine Ratio: 10 (ref 9–20)
BUN: 10 mg/dL (ref 6–24)
CO2: 21 mmol/L (ref 18–29)
Calcium: 8.5 mg/dL — ABNORMAL LOW (ref 8.7–10.2)
Chloride: 104 mmol/L (ref 96–106)
Creatinine, Ser: 1.04 mg/dL (ref 0.76–1.27)
GFR, EST AFRICAN AMERICAN: 97 mL/min/{1.73_m2} (ref >59)
GFR, EST NON AFRICAN AMERICAN: 84 mL/min/{1.73_m2} (ref >59)
GLUCOSE: 99 mg/dL (ref 65–99)
Potassium: 3.7 mmol/L (ref 3.5–5.2)
SODIUM: 138 mmol/L (ref 134–144)

## 2017-04-03 ENCOUNTER — Institutional Professional Consult (permissible substitution): Payer: Self-pay | Admitting: Pulmonary Disease

## 2017-04-07 ENCOUNTER — Inpatient Hospital Stay (INDEPENDENT_AMBULATORY_CARE_PROVIDER_SITE_OTHER): Payer: BLUE CROSS/BLUE SHIELD | Admitting: Physician Assistant

## 2017-04-08 ENCOUNTER — Telehealth: Payer: Self-pay | Admitting: Pulmonary Disease

## 2017-04-08 NOTE — Telephone Encounter (Signed)
Spoke with pt's sister who stated that pt has not received his nebulizer machine and when she called [facility] they stated the order was unclear as to what we are trying to order. The order states for a nebulizer machine. Will need to call the location where pt's order was sent to understand what is going on. Attempted to call DME was unable to reach anyone on the phone. Will need to call back 04/09/17

## 2017-04-09 NOTE — Telephone Encounter (Signed)
Called [facility] and spoke with Elita Quick. States that the reason the pt has not received his nebulizer machine is that the office note that was sent over does not mention anything about the pt being on nebulizer treatments. In order for the pt to receive this machine his OV noted with need an addendum.  RA - please advise. Thanks.

## 2017-04-09 NOTE — Telephone Encounter (Signed)
Done

## 2017-04-09 NOTE — Telephone Encounter (Signed)
03/31/17 OV note has been faxed to [facility] at [phone]. Received fax confirmation. Nothing further needed.

## 2017-04-13 ENCOUNTER — Institutional Professional Consult (permissible substitution): Payer: BLUE CROSS/BLUE SHIELD | Admitting: Pulmonary Disease

## 2017-04-17 DIAGNOSIS — J9600 Acute respiratory failure, unspecified whether with hypoxia or hypercapnia: Secondary | ICD-10-CM | POA: Diagnosis not present

## 2017-04-17 DIAGNOSIS — R0600 Dyspnea, unspecified: Secondary | ICD-10-CM | POA: Diagnosis not present

## 2017-04-21 ENCOUNTER — Ambulatory Visit (INDEPENDENT_AMBULATORY_CARE_PROVIDER_SITE_OTHER): Payer: BLUE CROSS/BLUE SHIELD | Admitting: Family

## 2017-04-21 ENCOUNTER — Encounter: Payer: Self-pay | Admitting: Pulmonary Disease

## 2017-04-21 ENCOUNTER — Ambulatory Visit (INDEPENDENT_AMBULATORY_CARE_PROVIDER_SITE_OTHER): Payer: BLUE CROSS/BLUE SHIELD | Admitting: Pulmonary Disease

## 2017-04-21 ENCOUNTER — Encounter: Payer: Self-pay | Admitting: Family

## 2017-04-21 ENCOUNTER — Telehealth: Payer: Self-pay | Admitting: Pulmonary Disease

## 2017-04-21 VITALS — BP 150/100 | HR 100 | Temp 98.3°F | Resp 18 | Ht 75.0 in | Wt 343.0 lb

## 2017-04-21 DIAGNOSIS — E1169 Type 2 diabetes mellitus with other specified complication: Secondary | ICD-10-CM

## 2017-04-21 DIAGNOSIS — I10 Essential (primary) hypertension: Secondary | ICD-10-CM

## 2017-04-21 DIAGNOSIS — G4733 Obstructive sleep apnea (adult) (pediatric): Secondary | ICD-10-CM | POA: Diagnosis not present

## 2017-04-21 DIAGNOSIS — I5032 Chronic diastolic (congestive) heart failure: Secondary | ICD-10-CM

## 2017-04-21 DIAGNOSIS — I480 Paroxysmal atrial fibrillation: Secondary | ICD-10-CM | POA: Diagnosis not present

## 2017-04-21 DIAGNOSIS — R499 Unspecified voice and resonance disorder: Secondary | ICD-10-CM

## 2017-04-21 DIAGNOSIS — E669 Obesity, unspecified: Secondary | ICD-10-CM

## 2017-04-21 DIAGNOSIS — I509 Heart failure, unspecified: Secondary | ICD-10-CM

## 2017-04-21 MED ORDER — METOPROLOL TARTRATE 37.5 MG PO TABS: 37.5000 mg | ORAL_TABLET | Freq: Two times a day (BID) | ORAL | 2 refills | Status: DC

## 2017-04-21 NOTE — Assessment & Plan Note (Signed)
Hypertension poorly controlled today with previous blood pressures appearing to be adequately controlled. Does not check blood pressure at home. Denies worst headache of life with no new symptoms of end organ damage noted. Continue current dosage of furosemide and metoprolol. Encouraged to monitor blood pressure at home and follow a low sodium diet.

## 2017-04-21 NOTE — Assessment & Plan Note (Signed)
[facility]All [phone] to see if he can get an earlier appointment for CPAP titration study Based on this study, we will set him up with CPAP

## 2017-04-21 NOTE — Patient Instructions (Signed)
Refill on metoprolol 1-further refills from PCP.  [facility]All [phone] to see if he can get an earlier appointment for CPAP titration study  Take 40 minutes Lasix twice daily for 5 days then back down to 40 mg daily. Decrease salt intake  Decrease albuterol nebs to once daily and uses second time only if needed

## 2017-04-21 NOTE — Assessment & Plan Note (Signed)
Take 40 minutes Lasix twice daily for 5 days then back down to 40 mg daily. Decrease salt intake  Decrease albuterol nebs to once daily and uses second time only if needed

## 2017-04-21 NOTE — Progress Notes (Signed)
   Subjective:    Patient ID: Luis [name], male    DOB: 11-14-1965, 51 y.o.   MRN: [id number]  HPI  51 year old morbidly obese man   He was recently hospitalized 4/29-5/15 for acute respiratory failure requiring mechanical ventilation due to influenza pneumonia. He also has nonischemic cardiomyopathy with EF of 35-40% with a normal left heart cath in 03/2016 in chronic atrial fibrillation. He required mechanical ventilation from 4/29 -5/10  He smoked about 15 pack years before he quit in 2010  On his post hospital visit 5/29, he had significant dyspnea although did not desaturate on walking in the office. He was provided with a nebulizer and has been using it since He can walk longer distances, has stopped using his walker and does not need a wheelchair anymore. He takes Fortamet runs of Lasix daily and says that he urinates a lot but his weight is unchanged from his last visit of 340 - 343 pounds  He does seem to be not compliant with his diet on detailed history  He also desires a refill on his blood pressure medications, he is still looking for PCP This CPAP titration study is scheduled for July 31     Significant tests/ events reviewed  CT angiogram 5/17 was negative for pulmonary embolism Home sleep test 05/2016 showed mild OSA with AHI of 14/hour with mild desaturations  Spirometry 03/31/17 shows moderate restriction with a ratio of 87 and FEV1 of 59% FVC of 54% with some flattening of the expiratory loop  Review of Systems neg for any significant sore throat, dysphagia, itching, sneezing, nasal congestion or excess/ purulent secretions, fever, chills, sweats, unintended wt loss, pleuritic or exertional cp, hempoptysis, orthopnea pnd or change in chronic leg swelling. Also denies presyncope, palpitations, heartburn, abdominal pain, nausea, vomiting, diarrhea or change in bowel or urinary habits, dysuria,hematuria, rash, arthralgias, visual complaints, headache, numbness  weakness or ataxia.     Objective:   Physical Exam   Gen. Pleasant, obese, in no distress ENT - no lesions, no post nasal drip Neck: No JVD, no thyromegaly, no carotid bruits Lungs: no use of accessory muscles, no dullness to percussion, decreased without rales or rhonchi  Cardiovascular: Rhythm regular, heart sounds  normal, no murmurs or gallops, 2+ peripheral edema Musculoskeletal: No deformities, no cyanosis or clubbing , no tremors        Assessment & Plan:

## 2017-04-21 NOTE — Patient Instructions (Addendum)
Thank you for choosing [facility].  SUMMARY AND INSTRUCTIONS:  Please continue to take your medications as prescribed.  They will call with your referral to podiatry and ENT.   Please check your blood sugars at least 1 time per day at minimum 1 time every other day.   Monitor your blood pressures at home and record the values to bring to an office visit.   Follow a low sodium / low carbohydrate intake.   Continue to follow up with Dr. Vassie Loll and Dr. Dicie Beam.  [facility] [address] [place], [place], [place] [address] [phone]  [facility] [address] [place], [place], [place] [address] [phone]  Medication:  Your prescription(s) have been submitted to your pharmacy or been printed and provided for you. Please take as directed and contact our office if you believe you are having problem(s) with the medication(s) or have any questions.  Follow up:  If your symptoms worsen or fail to improve, please contact our office for further instruction, or in case of emergency go directly to the emergency room at the closest medical facility.    DASH Eating Plan DASH stands for "Dietary Approaches to Stop Hypertension." The DASH eating plan is a healthy eating plan that has been shown to reduce high blood pressure (hypertension). It may also reduce your risk for type 2 diabetes, heart disease, and stroke. The DASH eating plan may also help with weight loss. What are tips for following this plan? General guidelines  Avoid eating more than 2,300 mg (milligrams) of salt (sodium) a day. If you have hypertension, you may need to reduce your sodium intake to 1,500 mg a day.  Limit alcohol intake to no more than 1 drink a day for nonpregnant women and 2 drinks a day for men. One drink equals 12 oz of beer, 5 oz of wine, or 1 oz of hard liquor.  Work with your health care provider to maintain a healthy body weight or to lose weight. Ask what an ideal weight is for  you.  Get at least 30 minutes of exercise that causes your heart to beat faster (aerobic exercise) most days of the week. Activities may include walking, swimming, or biking.  Work with your health care provider or diet and nutrition specialist (dietitian) to adjust your eating plan to your individual calorie needs. Reading food labels  Check food labels for the amount of sodium per serving. Choose foods with less than 5 percent of the Daily Value of sodium. Generally, foods with less than 300 mg of sodium per serving fit into this eating plan.  To find whole grains, look for the word "whole" as the first word in the ingredient list. Shopping  Buy products labeled as "low-sodium" or "no salt added."  Buy fresh foods. Avoid canned foods and premade or frozen meals. Cooking  Avoid adding salt when cooking. Use salt-free seasonings or herbs instead of table salt or sea salt. Check with your health care provider or pharmacist before using salt substitutes.  Do not fry foods. Cook foods using healthy methods such as baking, boiling, grilling, and broiling instead.  Cook with heart-healthy oils, such as olive, canola, soybean, or sunflower oil. Meal planning   Eat a balanced diet that includes: ? 5 or more servings of fruits and vegetables each day. At each meal, try to fill half of your plate with fruits and vegetables. ? Up to 6-8 servings of whole grains each day. ? Less than 6 oz  of lean meat, poultry, or fish each day. A 3-oz serving of meat is about the same size as a deck of cards. One egg equals 1 oz. ? 2 servings of low-fat dairy each day. ? A serving of nuts, seeds, or beans 5 times each week. ? Heart-healthy fats. Healthy fats called Omega-3 fatty acids are found in foods such as flaxseeds and coldwater fish, like sardines, salmon, and mackerel.  Limit how much you eat of the following: ? Canned or prepackaged foods. ? Food that is high in trans fat, such as fried  foods. ? Food that is high in saturated fat, such as fatty meat. ? Sweets, desserts, sugary drinks, and other foods with added sugar. ? Full-fat dairy products.  Do not salt foods before eating.  Try to eat at least 2 vegetarian meals each week.  Eat more home-cooked food and less restaurant, buffet, and fast food.  When eating at a restaurant, ask that your food be prepared with less salt or no salt, if possible. What foods are recommended? The items listed may not be a complete list. Talk with your dietitian about what dietary choices are best for you. Grains Whole-grain or whole-wheat bread. Whole-grain or whole-wheat pasta. Brown rice. Orpah Cobb. Bulgur. Whole-grain and low-sodium cereals. Pita bread. Low-fat, low-sodium crackers. Whole-wheat flour tortillas. Vegetables Fresh or frozen vegetables (raw, steamed, roasted, or grilled). Low-sodium or reduced-sodium tomato and vegetable juice. Low-sodium or reduced-sodium tomato sauce and tomato paste. Low-sodium or reduced-sodium canned vegetables. Fruits All fresh, dried, or frozen fruit. Canned fruit in natural juice (without added sugar). Meat and other protein foods Skinless chicken or [place]. Ground chicken or [place]. Pork with fat trimmed off. Fish and seafood. Egg whites. Dried beans, peas, or lentils. Unsalted nuts, nut butters, and seeds. Unsalted canned beans. Lean cuts of beef with fat trimmed off. Low-sodium, lean deli meat. Dairy Low-fat (1%) or fat-free (skim) milk. Fat-free, low-fat, or reduced-fat cheeses. Nonfat, low-sodium ricotta or cottage cheese. Low-fat or nonfat yogurt. Low-fat, low-sodium cheese. Fats and oils Soft margarine without trans fats. Vegetable oil. Low-fat, reduced-fat, or light mayonnaise and salad dressings (reduced-sodium). Canola, safflower, olive, soybean, and sunflower oils. Avocado. Seasoning and other foods Herbs. Spices. Seasoning mixes without salt. Unsalted popcorn and pretzels. Fat-free  sweets. What foods are not recommended? The items listed may not be a complete list. Talk with your dietitian about what dietary choices are best for you. Grains Baked goods made with fat, such as croissants, muffins, or some breads. Dry pasta or rice meal packs. Vegetables Creamed or fried vegetables. Vegetables in a cheese sauce. Regular canned vegetables (not low-sodium or reduced-sodium). Regular canned tomato sauce and paste (not low-sodium or reduced-sodium). Regular tomato and vegetable juice (not low-sodium or reduced-sodium). Rosita Fire. Olives. Fruits Canned fruit in a light or heavy syrup. Fried fruit. Fruit in cream or butter sauce. Meat and other protein foods Fatty cuts of meat. Ribs. Fried meat. Tomasa Blase. Sausage. Bologna and other processed lunch meats. Salami. Fatback. Hotdogs. Bratwurst. Salted nuts and seeds. Canned beans with added salt. Canned or smoked fish. Whole eggs or egg yolks. Chicken or [place] with skin. Dairy Whole or 2% milk, cream, and half-and-half. Whole or full-fat cream cheese. Whole-fat or sweetened yogurt. Full-fat cheese. Nondairy creamers. Whipped toppings. Processed cheese and cheese spreads. Fats and oils Butter. Stick margarine. Lard. Shortening. Ghee. Bacon fat. Tropical oils, such as coconut, palm kernel, or palm oil. Seasoning and other foods Salted popcorn and pretzels. Onion salt, garlic salt, seasoned salt,  table salt, and sea salt. Worcestershire sauce. Tartar sauce. Barbecue sauce. Teriyaki sauce. Soy sauce, including reduced-sodium. Steak sauce. Canned and packaged gravies. Fish sauce. Oyster sauce. Cocktail sauce. Horseradish that you find on the shelf. Ketchup. Mustard. Meat flavorings and tenderizers. Bouillon cubes. Hot sauce and Tabasco sauce. Premade or packaged marinades. Premade or packaged taco seasonings. Relishes. Regular salad dressings. Where to find more information:  National Heart, Lung, and Blood Institute:  PopSteam.is  American Heart Association: www.heart.org Summary  The DASH eating plan is a healthy eating plan that has been shown to reduce high blood pressure (hypertension). It may also reduce your risk for type 2 diabetes, heart disease, and stroke.  With the DASH eating plan, you should limit salt (sodium) intake to 2,300 mg a day. If you have hypertension, you may need to reduce your sodium intake to 1,500 mg a day.  When on the DASH eating plan, aim to eat more fresh fruits and vegetables, whole grains, lean proteins, low-fat dairy, and heart-healthy fats.  Work with your health care provider or diet and nutrition specialist (dietitian) to adjust your eating plan to your individual calorie needs. This information is not intended to replace advice given to you by your health care provider. Make sure you discuss any questions you have with your health care provider. Document Released: 10/09/2011 Document Revised: 10/13/2016 Document Reviewed: 10/13/2016 Elsevier Interactive Patient Education  2017 [employer].

## 2017-04-21 NOTE — Assessment & Plan Note (Signed)
Type 2 diabetes remains poorly controlled with current medication regimen and patient less than cooperative with monitoring blood sugars at home indicating that "he cannot commit to checking his blood sugars daily." Refer to podiatry for diabetic foot exam per patient request. Not currently maintained on statin or ACE/ARB for CAD risk reduction. Continue current dosage of Lantus with follow up A1c in one month to check current status.

## 2017-04-21 NOTE — Assessment & Plan Note (Addendum)
Recently intubated during hospitalization for airway protection and continues to experience mild/moderate raspiness of his voice that continues to slowly improve. 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 Discussed options of watchful waiting versus referral to ENT. At present there is no significant findings indicating need to stay out of work longer than 7/1 planned return. Referral to ENT placed per patient request.

## 2017-04-21 NOTE — Assessment & Plan Note (Signed)
BMI of 42. Several co-morbidities associated with obesity including diabetes, hypertension and obstructive sleep apnea.  Recommend weight loss of 5-10% of current body weight. Recommend increasing physical activity to 30 minutes of moderate level activity daily. Encourage nutritional intake that focuses on nutrient dense foods and is moderate, varied, and balanced and is low in saturated fats and processed/sugary foods. Continue to monitor.

## 2017-04-21 NOTE — Progress Notes (Signed)
Subjective:    Patient ID: Luis [name], male    DOB: 1966/04/21, 51 y.o.   MRN: [id number]  Chief Complaint  Patient presents with  . Establish Care    referral to podiatry, voice has been affected by a breathing tube and wants to know what do, ankles and feet swollen, diabetic management    HPI:  Luis [name] is a 51 y.o. male who  has a past medical history of Atrial fibrillation ([place]); CHF (congestive heart failure) ([place]); DM (diabetes mellitus) ([place]); Essential hypertension; High cholesterol; History of tobacco abuse; HTN (hypertension); Hyperlipidemia; Morbid obesity ([place]); Obesity; OSA (obstructive sleep apnea); and Pneumonia (~ 12/2015; 03/19/2016). and presents today for an office visit to establish care. His sister is present for today's office visit.   1.) Type 2 diabetes - Currently maintained on Lantus at 10 units daily. Reports taking the medication as prescribed and denies adverse side effects or hypoglycemic readings. Rarely checks blood sugar at home . Denies new symptoms of end organ damage. No excessive hunger or thirst. Does urinate a lot secondary to Lasix. Not currently following a low sodium diet. Request a referral to podiatry for diabetic foot care.   Lab Results  Component Value Date   HGBA1C 8.2 (H) 03/01/2017     Lab Results  Component Value Date   CREATININE 1.04 03/31/2017   BUN 10 03/31/2017   NA 138 03/31/2017   K 3.7 03/31/2017   CL 104 03/31/2017   CO2 21 03/31/2017    2.) Heart failure - Currently maintained on furosemide and metoprolol. Reports taking the medication as prescribed and denies adverse side effects. Continues to experience lower extremity edema and easily fatiguable. Seen by cardiology and believed to be related to lung pathology.   3.) Throat issues - Associated symptom of a raspy voice following being intubated in the hospital. He has had an improvements since leaving the hospital but notes that his voice is not yet at 100%. He  has concern for structural damage since it has been longer than 2 weeks that he was told in the hospital. No dysphasia or sore throat. 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 Course of the symptoms has improved since initial onset.   Allergies  Allergen Reactions  . Demerol [Meperidine] Itching  . Morphine And Related Itching      Outpatient Medications Prior to Visit  Medication Sig Dispense Refill  . albuterol (PROVENTIL HFA;VENTOLIN HFA) 108 (90 Base) MCG/ACT inhaler Inhale 2 puffs into the lungs every 4 (four) hours as needed for wheezing. 1 Inhaler 0  . albuterol (PROVENTIL) (2.5 MG/3ML) 0.083% nebulizer solution Take 3 mLs (2.5 mg total) by nebulization every 6 (six) hours as needed for wheezing or shortness of breath. 75 mL 4  . aspirin EC 81 MG EC tablet Take 1 tablet (81 mg total) by mouth daily. 30 tablet 0  . blood glucose meter kit and supplies KIT Dispense based on patient and insurance preference. Use up to four times daily as directed. (FOR ICD-9 250.00, 250.01). 1 each 0  . fluticasone (FLONASE) 50 MCG/ACT nasal spray Place 2 sprays into both nostrils daily. 16 g 3  . furosemide (LASIX) 40 MG tablet Take 1 tablet (40 mg total) by mouth daily. 30 tablet 3  . insulin glargine (LANTUS) 100 UNIT/ML injection Inject 0.1 mLs (10 Units total) into the skin daily. 10 mL 11  . Lancets (ACCU-CHEK SAFE-T PRO) lancets Use  as instructed 100 each 12  . pantoprazole (PROTONIX) 40 MG tablet Take 1 tablet (40 mg total) by mouth daily. 30 tablet 0  . saccharomyces boulardii (FLORASTOR) 250 MG capsule Take 1 capsule (250 mg total) by mouth 2 (two) times daily. 60 capsule 0  . traMADol (ULTRAM) 50 MG tablet Take 2 tablets (100 mg total) by mouth every 6 (six) hours as needed for moderate pain. (Patient not taking: Reported on 04/21/2017) 20 tablet 0  . insulin glargine (LANTUS) 100 unit/mL SOPN Inject 0.1 mLs (10 Units total) into the skin at bedtime. 15  mL 11  . Metoprolol Tartrate 37.5 MG TABS Take 37.5 mg by mouth 2 (two) times daily. 60 tablet 0   No facility-administered medications prior to visit.      Past Medical History:  Diagnosis Date  . Atrial fibrillation ([place])   . CHF (congestive heart failure) ([place])   . DM (diabetes mellitus) ([place])   . Essential hypertension   . High cholesterol   . History of tobacco abuse   . HTN (hypertension)   . Hyperlipidemia   . Morbid obesity ([place])   . Obesity   . OSA (obstructive sleep apnea)   . Pneumonia ~ 12/2015; 03/19/2016      Past Surgical History:  Procedure Laterality Date  . CARDIAC CATHETERIZATION N/A 03/24/2016   Procedure: Left Heart Cath and Coronary Angiography;  Surgeon: Leonie Man, MD;  Location: [facility];  Service: Cardiovascular;  Laterality: N/A;  . FEMUR IM NAIL Right 2012  . FRACTURE SURGERY        Family History  Problem Relation Age of Onset  . Diabetes type II Mother   . Diabetes Mother   . Diabetes type II Other   . Other Unknown        never knew father  . Hypertension Maternal Grandfather   . Stroke Neg Hx   . Cancer Neg Hx       Social History   Social History  . Marital status: Single    Spouse name: N/A  . Number of children: 1  . Years of education: 22   Occupational History  . [employer]    Social History Main Topics  . Smoking status: Former Smoker    Packs/day: 0.50    Years: 20.00    Types: Cigarettes    Quit date: 03/02/2009  . Smokeless tobacco: Never Used  . Alcohol use No  . Drug use: No  . Sexual activity: No   Other Topics Concern  . Not on file   Social History Narrative   ** Merged History Encounter **       Lives in [place] with mother and three grandchildren.     Works for [employer] - works from home.     Does not routinely exercise.   Caffeine use: none   Fun/Hobby: Play video games and play with grandchildren.       Review of Systems  Constitutional: Negative for chills  and fever.  Eyes:       Negative for changes in vision.  Respiratory: Negative for cough, chest tightness, shortness of breath and wheezing.   Cardiovascular: Negative for chest pain, palpitations and leg swelling.  Endocrine: Negative for polydipsia, polyphagia and polyuria.  Neurological: Negative for dizziness, weakness, light-headedness and headaches.       Objective:    BP (!) 150/100 (BP Location: Left Arm, Patient Position: Sitting, Cuff Size: Large)   Pulse 100  Temp 98.3 F (36.8 C) (Oral)   Resp 18   Ht '6\' 3"'$  (1.905 m)   Wt (!) 343 lb (155.6 kg)   SpO2 98%   BMI 42.87 kg/m  Nursing note and vital signs reviewed.  Physical Exam  Constitutional: He is oriented to person, place, and time. He appears well-developed and well-nourished. No distress.  HENT:  Mouth/Throat: Uvula is midline, oropharynx is clear and moist and mucous membranes are normal.  Cardiovascular: Normal rate, regular rhythm, normal heart sounds and intact distal pulses.   Pulmonary/Chest: Breath sounds normal. No respiratory distress. He has no wheezes. He has no rales. He exhibits no tenderness.  Neurological: He is alert and oriented to person, place, and time.  Skin: Skin is warm and dry.  Psychiatric: He has a normal mood and affect. His behavior is normal. Judgment and thought content normal.        Assessment & Plan:   Problem List Items Addressed This Visit      Cardiovascular and Mediastinum   Essential hypertension - Primary (Chronic)    Hypertension poorly controlled today with previous blood pressures appearing to be adequately controlled. Does not check blood pressure at home. Denies worst headache of life with no new symptoms of end organ damage noted. Continue current dosage of furosemide and metoprolol. Encouraged to monitor blood pressure at home and follow a low sodium diet.       Relevant Medications   Metoprolol Tartrate 37.5 MG TABS   Diastolic congestive heart failure  (HCC)    Appears stable with mild/moderate lower extremity edema that is likely multifactorial including obesity, sleep apnea, and heart failure. No cardiac symptoms currently. Continue current dosage of metoprolol and furosemide. Pulmonology hoping for additional weight loss with furosemide which has not occurred to this point. Continue to follow up with cardiology as scheduled.       Relevant Medications   Metoprolol Tartrate 37.5 MG TABS   Atrial fibrillation (HCC)   Relevant Medications   Metoprolol Tartrate 37.5 MG TABS     Endocrine   Diabetes mellitus type 2 in obese ([place])    Type 2 diabetes remains poorly controlled with current medication regimen and patient less than cooperative with monitoring blood sugars at home indicating that "he cannot commit to checking his blood sugars daily." Refer to podiatry for diabetic foot exam per patient request. Not currently maintained on statin or ACE/ARB for CAD risk reduction. Continue current dosage of Lantus with follow up A1c in one month to check current status.       Relevant Orders   Ambulatory referral to Podiatry     Other   Morbid obesity ([place])    BMI of 42. Several co-morbidities associated with obesity including diabetes, hypertension and obstructive sleep apnea.  Recommend weight loss of 5-10% of current body weight. Recommend increasing physical activity to 30 minutes of moderate level activity daily. Encourage nutritional intake that focuses on nutrient dense foods and is moderate, varied, and balanced and is low in saturated fats and processed/sugary foods. Continue to monitor.         Change in voice    Recently intubated during hospitalization for airway protection and continues to experience mild/moderate raspiness of his voice that continues to slowly improve. 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 Discussed options of watchful waiting versus referral  to ENT. Referral placed.  Relevant Orders   Ambulatory referral to ENT       I am having Luis [name] maintain his aspirin, fluticasone, insulin glargine, pantoprazole, saccharomyces boulardii, traMADol, albuterol, accu-chek safe-t pro, blood glucose meter kit and supplies, furosemide, albuterol, and Metoprolol Tartrate.   Meds ordered this encounter  Medications  . DISCONTD: Metoprolol Tartrate 37.5 MG TABS    Sig: Take 37.5 mg by mouth 2 (two) times daily.    Dispense:  60 tablet    Refill:  2    Order Specific Question:   Supervising Provider    Answer:   Pricilla Holm A [[id number]]  . Metoprolol Tartrate 37.5 MG TABS    Sig: Take 37.5 mg by mouth 2 (two) times daily.    Dispense:  60 tablet    Refill:  2    Order Specific Question:   Supervising Provider    Answer:   Pricilla Holm A [[id number]]    A total of 50 minutes were spent face-to-face with the patient during this encounter and over half of that time was spent on counseling and coordination of care.  We discussed in depth progression and improvement of his raspy voice, need for compliance with medication regimen to reduce risk of end organ damage in the future, and answering questions related to his chronic conditions.    Follow-up: Return in about 1 month (around 05/21/2017), or if symptoms worsen or fail to improve.  Mauricio Po, FNP

## 2017-04-21 NOTE — Telephone Encounter (Signed)
Spoke with patient. Advised patient it was ok to stop probiotics if he wanted to. He stated that his told him that he could just eat yogurt instead. Advised patient that was correct. Patient verbalized understanding. Nothing further needed.

## 2017-04-21 NOTE — Assessment & Plan Note (Signed)
Appears stable with mild/moderate lower extremity edema that is likely multifactorial including obesity, sleep apnea, and heart failure. No cardiac symptoms currently. Continue current dosage of metoprolol and furosemide. Pulmonology hoping for additional weight loss with furosemide which has not occurred to this point. Continue to follow up with cardiology as scheduled.

## 2017-04-21 NOTE — Assessment & Plan Note (Signed)
Refill on metoprolol 1-further refills from PCP.

## 2017-04-22 NOTE — Progress Notes (Signed)
Cardiology Office Note    Date:  04/23/2017   ID:  Luis [name], DOB 1966/10/30, MRN [id number]  PCP:  Patient, No Pcp Per  Cardiologist: Dr. Angelena Form  Chief Complaint  Patient presents with  . Follow-up    History of Present Illness:  Luis [name] is a 51 y.o. male with history of nonischemic cardiomyopathy, mild CAD, HTN, obesity, DM, sleep apnea, and medical noncompliance. Echo 01/2016 LVEF 35-40% with global hypokinesis grade 2 DD. Stress test was abnormal and cardiac cath 03/24/16 with mild LAD stenosis. He was admitted to [facility] 02[facility]2017 with pneumonia and the flu as well as volume overload requiring intubation after not taking any of his meds, not seen by Cards. Echo May 2018 normal LVEF.  Saw Dr. Angelena Form 03/26/17 for Post hospital follow-up. ARB was stopped in the hospital due to renal insufficiency. Weight was stable but Lasix 40 mg daily was restarted. He was referred to pulmonary as he was having ongoing dyspnea and was only sent home on a rescue inhaler. Patient saw Dr. Elsworth Soho yest and was noncompliant with diet. Felt to have diastolic CHF. Lasix was increased to 40 mg twice a day for 5 days and low sodium diet was given. Weight 343 lbs up 4 lbs.  Patient come in today accompanied by his sister. They arrived late and tried to leave as I was discussing him with Dr. Angelena Form. His sister is very angry and aggressive Stating she won't change any medications today. Patient's Lasix was increased by Dr. Elsworth Soho but this is the first day. Blood pressure is very high this morning but he just took his medications and she says it's down to 140/80 by the afternoon. He is noncompliant with his diet and eats a high sodium diet.    Past Medical History:  Diagnosis Date  . Atrial fibrillation ([place])   . CHF (congestive heart failure) ([place])   . DM (diabetes mellitus) ([place])   . Essential hypertension   . High cholesterol   . History of tobacco abuse   . HTN (hypertension)   . Hyperlipidemia   .  Morbid obesity ([place])   . Obesity   . OSA (obstructive sleep apnea)   . Pneumonia ~ 12/2015; 03/19/2016    Past Surgical History:  Procedure Laterality Date  . CARDIAC CATHETERIZATION N/A 03/24/2016   Procedure: Left Heart Cath and Coronary Angiography;  Surgeon: Leonie Man, MD;  Location: [facility];  Service: Cardiovascular;  Laterality: N/A;  . FEMUR IM NAIL Right 2012  . FRACTURE SURGERY      Current Medications: Outpatient Medications Prior to Visit  Medication Sig Dispense Refill  . albuterol (PROVENTIL HFA;VENTOLIN HFA) 108 (90 Base) MCG/ACT inhaler Inhale 2 puffs into the lungs every 4 (four) hours as needed for wheezing. 1 Inhaler 0  . albuterol (PROVENTIL) (2.5 MG/3ML) 0.083% nebulizer solution Take 3 mLs (2.5 mg total) by nebulization every 6 (six) hours as needed for wheezing or shortness of breath. 75 mL 4  . aspirin EC 81 MG EC tablet Take 1 tablet (81 mg total) by mouth daily. 30 tablet 0  . blood glucose meter kit and supplies KIT Dispense based on patient and insurance preference. Use up to four times daily as directed. (FOR ICD-9 250.00, 250.01). 1 each 0  . fluticasone (FLONASE) 50 MCG/ACT nasal spray Place 2 sprays into both nostrils daily. 16 g 3  . furosemide (LASIX) 40 MG tablet Take 1 tablet (40 mg total) by mouth daily. (Patient taking differently:  Take 40 mg by mouth 2 (two) times daily. For 5 days, today 04/23/17 is day 1.) 30 tablet 3  . insulin glargine (LANTUS) 100 UNIT/ML injection Inject 0.1 mLs (10 Units total) into the skin daily. 10 mL 11  . Lancets (ACCU-CHEK SAFE-T PRO) lancets Use as instructed 100 each 12  . Metoprolol Tartrate 37.5 MG TABS Take 37.5 mg by mouth 2 (two) times daily. 60 tablet 2  . pantoprazole (PROTONIX) 40 MG tablet Take 1 tablet (40 mg total) by mouth daily. 30 tablet 0  . saccharomyces boulardii (FLORASTOR) 250 MG capsule Take 1 capsule (250 mg total) by mouth 2 (two) times daily. 60 capsule 0  . traMADol (ULTRAM) 50 MG  tablet Take 2 tablets (100 mg total) by mouth every 6 (six) hours as needed for moderate pain. 20 tablet 0   No facility-administered medications prior to visit.      Allergies:   Demerol [meperidine] and Morphine and related   Social History   Social History  . Marital status: Single    Spouse name: N/A  . Number of children: 1  . Years of education: 25   Occupational History  . [employer]    Social History Main Topics  . Smoking status: Former Smoker    Packs/day: 0.50    Years: 20.00    Types: Cigarettes    Quit date: 03/02/2009  . Smokeless tobacco: Never Used  . Alcohol use No  . Drug use: No  . Sexual activity: No   Other Topics Concern  . None   Social History Narrative   ** Merged History Encounter **       Lives in [place] with mother and three grandchildren.     Works for [employer] - works from home.     Does not routinely exercise.   Caffeine use: none   Fun/Hobby: Play video games and play with grandchildren.      Family History:  The patient's   family history includes Diabetes in his mother; Diabetes type II in his mother and other; Hypertension in his maternal grandfather.   ROS:   Please see the history of present illness.    Review of Systems  Constitution: Negative.  HENT: Negative.   Cardiovascular: Positive for dyspnea on exertion and leg swelling.  Respiratory: Negative.   Endocrine: Negative.   Hematologic/Lymphatic: Negative.   Musculoskeletal: Negative.   Gastrointestinal: Negative.   Genitourinary: Negative.   Neurological: Negative.    All other systems reviewed and are negative.   PHYSICAL EXAM:   VS:  BP (!) 156/110   Pulse 96   Ht 6\' 3"  (1.905 m)   Wt (!) 343 lb (155.6 kg)   SpO2 96%   BMI 42.87 kg/m   Physical Exam  GEN: Obese, in no acute distress  Neck: no JVD, carotid bruits, or masses Cardiac:RRR; no murmurs, rubs, or gallops  Respiratory:  clear to auscultation bilaterally, normal work of  breathing GI: soft, nontender, nondistended, + BS Ext: 3+ edema bilaterally Neuro:  Alert and Oriented x 3 Psych: defient  Wt Readings from Last 3 Encounters:  04/23/17 (!) 343 lb (155.6 kg)  04/21/17 (!) 343 lb 3.2 oz (155.7 kg)  04/21/17 (!) 343 lb (155.6 kg)      Studies/Labs Reviewed:   EKG:  EKG is not ordered today.   Recent Labs: 03/15/2017: B Natriuretic Peptide 261.0 03/17/2017: Magnesium 2.0 03/23/2017: ALT 41; Hemoglobin 11.8; Platelets 205 03/31/2017: BUN 10; Creatinine, Ser 1.04; Potassium  3.7; Sodium 138   Lipid Panel    Component Value Date/Time   CHOL 164 03/24/2016 1108   TRIG 238 (H) 03/12/2017 0502   HDL 23 (L) 03/24/2016 1108   CHOLHDL 7.1 03/24/2016 1108   VLDL 39 03/24/2016 1108   LDLCALC 102 (H) 03/24/2016 1108    Additional studies/ records that were reviewed today include:   Echo May 2018: Left ventricle: The cavity size was moderately dilated. Wall   thickness was increased in a pattern of moderate LVH. Systolic   function was normal. The estimated ejection fraction was in the   range of 50% to 55%. Wall motion was normal; there were no   regional wall motion abnormalities. Doppler parameters are   consistent with abnormal left ventricular relaxation (grade 1   diastolic dysfunction). - Left atrium: The atrium was mildly dilated.   Impressions:   - Technically difficult; definity used; normal LV systolic   function; mild diastolic dysfunction; moderate LVH; moderate LVE;   mild LAE.      ASSESSMENT:    1. NICM (nonischemic cardiomyopathy) ([place])   2. Acute diastolic congestive heart failure ([place])   3. Essential hypertension   4. Benign essential HTN   5. Paroxysmal atrial fibrillation ([facility])   6. Morbid obesity ([place])      PLAN:  In order of problems listed above:  Nonischemic cardiomyopathy with normalization of LV function on echo 05/6282 but with diastolic dysfunction. Patient's blood pressure is elevated and he has fluid  overload. Lasix was increased by Dr. Elsworth Soho but today's a first day he will do it. ARB was stopped in the hospital because of renal insufficiency. Discussed in detail with Dr. Angelena Form who says to try increase Lasix, 2 g sodium diet and he will see him back in follow-up.Patient tried to leave when I was discussing with Dr.McAlhany. We were able to give him discharge instructions.  Acute diastolic CHF secondary to dietary noncompliance and diastolic dysfunction. 2 g sodium diet and increase Lasix recommended.  Essential hypertension uncontrolled. Partly due to diet. Patient and his sister are refusing to start any new medications today. I've asked him to take his metoprolol 12 hours apart for better coverage in the morning.  Morbid obesity weight loss neccessary    Medication Adjustments/Labs and Tests Ordered: Current medicines are reviewed at length with the patient today.  Concerns regarding medicines are outlined above.  Medication changes, Labs and Tests ordered today are listed in the Patient Instructions below. Patient Instructions  Medication Instructions:   Your physician recommends that you continue on your current medications as directed. Please refer to the Current Medication list given to you today.   If you need a refill on your cardiac medications before your next appointment, please call your pharmacy.  Labwork: NONE ORDERED  TODAY    Testing/Procedures: NONE ORDERED  TODAY    Follow-Up:  IN ONE MONTH  WITH DR [facility]    Any Other Special Instructions Will Be Listed Below (If Applicable).  Sumner Boast, PA-C  04/23/2017 10:15 AM    [facility] [place], [place], [place]  [address] Phone: [phone]; Fax: [phone]

## 2017-04-23 ENCOUNTER — Encounter: Payer: Self-pay | Admitting: Physician Assistant

## 2017-04-23 ENCOUNTER — Ambulatory Visit (INDEPENDENT_AMBULATORY_CARE_PROVIDER_SITE_OTHER): Payer: BLUE CROSS/BLUE SHIELD | Admitting: Physician Assistant

## 2017-04-23 ENCOUNTER — Encounter (INDEPENDENT_AMBULATORY_CARE_PROVIDER_SITE_OTHER): Payer: Self-pay

## 2017-04-23 VITALS — BP 156/110 | HR 96 | Ht 75.0 in | Wt 343.0 lb

## 2017-04-23 DIAGNOSIS — I428 Other cardiomyopathies: Secondary | ICD-10-CM | POA: Diagnosis not present

## 2017-04-23 DIAGNOSIS — I10 Essential (primary) hypertension: Secondary | ICD-10-CM

## 2017-04-23 DIAGNOSIS — I5031 Acute diastolic (congestive) heart failure: Secondary | ICD-10-CM | POA: Diagnosis not present

## 2017-04-23 NOTE — Patient Instructions (Signed)
Medication Instructions:   Your physician recommends that you continue on your current medications as directed. Please refer to the Current Medication list given to you today.   If you need a refill on your cardiac medications before your next appointment, please call your pharmacy.  Labwork: NONE ORDERED  TODAY    Testing/Procedures: NONE ORDERED  TODAY    Follow-Up:  IN ONE MONTH  WITH DR [facility]    Any Other Special Instructions Will Be Listed Below (If Applicable).

## 2017-04-27 DIAGNOSIS — R490 Dysphonia: Secondary | ICD-10-CM | POA: Diagnosis not present

## 2017-05-11 NOTE — Addendum Note (Signed)
Addended by: Bernita Buffy D on: 05/11/2017 08:46 AM   Modules accepted: Orders

## 2017-05-12 ENCOUNTER — Ambulatory Visit: Payer: BLUE CROSS/BLUE SHIELD | Admitting: Podiatry

## 2017-05-17 DIAGNOSIS — R0600 Dyspnea, unspecified: Secondary | ICD-10-CM | POA: Diagnosis not present

## 2017-05-17 DIAGNOSIS — J9600 Acute respiratory failure, unspecified whether with hypoxia or hypercapnia: Secondary | ICD-10-CM | POA: Diagnosis not present

## 2017-05-19 ENCOUNTER — Ambulatory Visit (INDEPENDENT_AMBULATORY_CARE_PROVIDER_SITE_OTHER): Payer: BLUE CROSS/BLUE SHIELD | Admitting: Podiatry

## 2017-05-19 ENCOUNTER — Encounter: Payer: Self-pay | Admitting: Podiatry

## 2017-05-19 DIAGNOSIS — M79676 Pain in unspecified toe(s): Secondary | ICD-10-CM

## 2017-05-19 DIAGNOSIS — B351 Tinea unguium: Secondary | ICD-10-CM

## 2017-05-19 NOTE — Progress Notes (Signed)
   Subjective:    Patient ID: Luis [name], male    DOB: [birth year], 51 y.o.   MRN: [id number]  HPI this patient presents the office with chief complaint of long thick painful overgrowing nails both feet.  He presents the office stating that he has been able to self treat and has been hospitalized.  He was told in the hospital. they were unable to trim his nails, but to make an appointment with this office for future treatment.  This patient has been diagnosed with diabetes mellitus and has been hospitalized with breathing difficulties.  He does relate that he has significant swelling in both feet for which she is taking Lasix. He presents the office today for an evaluation and treatment of his feet    Review of Systems  All other systems reviewed and are negative.      Objective:   Physical Exam GENERAL APPEARANCE: Alert, conversant. Appropriately groomed. No acute distress.  VASCULAR: Pedal pulses are  palpable at  [facility] and PT bilateral.  Capillary refill time is immediate to all digits,  Normal temperature gradient.   NEUROLOGIC: sensation is normal to 5.07 monofilament at 5/5 sites bilateral.  Light touch is intact bilateral, Muscle strength normal.  MUSCULOSKELETAL: acceptable muscle strength, tone and stability bilateral.  Intrinsic muscluature intact bilateral.  Rectus appearance of foot and digits noted bilateral. Severe swelling noted. NAILS  thick disfigured discolored nails with small debris noted bilateral. Light.  Patient has thick mycotic nails with no evidence of any bacterial infection or drainage DERMATOLOGIC: skin color, texture, and turgor are within normal limits.  No preulcerative lesions or ulcers  are seen, no interdigital maceration noted.  No open lesions present.  . No drainage noted.         Assessment & Plan:  Onychomycosis  B/L  Diabetes with swelling  B/L  IE  Debride nails.  RTC 3 months   Helane Gunther DPM

## 2017-05-28 ENCOUNTER — Ambulatory Visit: Payer: BLUE CROSS/BLUE SHIELD | Admitting: Cardiovascular Disease

## 2017-06-02 ENCOUNTER — Encounter (HOSPITAL_BASED_OUTPATIENT_CLINIC_OR_DEPARTMENT_OTHER): Payer: BLUE CROSS/BLUE SHIELD

## 2017-06-04 ENCOUNTER — Ambulatory Visit: Payer: BLUE CROSS/BLUE SHIELD | Admitting: Podiatry

## 2017-06-11 ENCOUNTER — Ambulatory Visit (INDEPENDENT_AMBULATORY_CARE_PROVIDER_SITE_OTHER): Payer: BLUE CROSS/BLUE SHIELD | Admitting: Family Medicine

## 2017-06-11 ENCOUNTER — Encounter: Payer: Self-pay | Admitting: Family Medicine

## 2017-06-11 ENCOUNTER — Telehealth: Payer: Self-pay

## 2017-06-11 VITALS — BP 142/84 | HR 61 | Temp 98.1°F | Ht 75.0 in | Wt 355.6 lb

## 2017-06-11 DIAGNOSIS — I428 Other cardiomyopathies: Secondary | ICD-10-CM

## 2017-06-11 DIAGNOSIS — R600 Localized edema: Secondary | ICD-10-CM

## 2017-06-11 DIAGNOSIS — G4733 Obstructive sleep apnea (adult) (pediatric): Secondary | ICD-10-CM | POA: Diagnosis not present

## 2017-06-11 DIAGNOSIS — R262 Difficulty in walking, not elsewhere classified: Secondary | ICD-10-CM | POA: Diagnosis not present

## 2017-06-11 DIAGNOSIS — I5032 Chronic diastolic (congestive) heart failure: Secondary | ICD-10-CM | POA: Diagnosis not present

## 2017-06-11 DIAGNOSIS — E669 Obesity, unspecified: Secondary | ICD-10-CM

## 2017-06-11 DIAGNOSIS — I509 Heart failure, unspecified: Secondary | ICD-10-CM | POA: Diagnosis not present

## 2017-06-11 DIAGNOSIS — E782 Mixed hyperlipidemia: Secondary | ICD-10-CM

## 2017-06-11 DIAGNOSIS — I10 Essential (primary) hypertension: Secondary | ICD-10-CM

## 2017-06-11 DIAGNOSIS — E1169 Type 2 diabetes mellitus with other specified complication: Secondary | ICD-10-CM | POA: Diagnosis not present

## 2017-06-11 DIAGNOSIS — I480 Paroxysmal atrial fibrillation: Secondary | ICD-10-CM

## 2017-06-11 DIAGNOSIS — B351 Tinea unguium: Secondary | ICD-10-CM

## 2017-06-11 LAB — CBC WITH DIFFERENTIAL/PLATELET
Basophils Absolute: 0 10*3/uL (ref 0.0–0.1)
Basophils Relative: 0.5 % (ref 0.0–3.0)
Eosinophils Absolute: 0.2 10*3/uL (ref 0.0–0.7)
Eosinophils Relative: 3.5 % (ref 0.0–5.0)
HCT: 43.6 % (ref 39.0–52.0)
Hemoglobin: 14.3 g/dL (ref 13.0–17.0)
Lymphocytes Relative: 32.1 % (ref 12.0–46.0)
Lymphs Abs: 2.1 10*3/uL (ref 0.7–4.0)
MCHC: 32.7 g/dL (ref 30.0–36.0)
MCV: 95.5 fl (ref 78.0–100.0)
Monocytes Absolute: 0.5 10*3/uL (ref 0.1–1.0)
Monocytes Relative: 8.2 % (ref 3.0–12.0)
Neutro Abs: 3.7 10*3/uL (ref 1.4–7.7)
Neutrophils Relative %: 55.7 % (ref 43.0–77.0)
Platelets: 279 10*3/uL (ref 150.0–400.0)
RBC: 4.57 Mil/uL (ref 4.22–5.81)
RDW: 15.8 % — ABNORMAL HIGH (ref 11.5–15.5)
WBC: 6.6 10*3/uL (ref 4.0–10.5)

## 2017-06-11 LAB — TSH: TSH: 2.24 u[IU]/mL (ref 0.35–4.50)

## 2017-06-11 LAB — COMPREHENSIVE METABOLIC PANEL
ALT: 15 U/L (ref 0–53)
AST: 12 U/L (ref 0–37)
Albumin: 4 g/dL (ref 3.5–5.2)
Alkaline Phosphatase: 130 U/L — ABNORMAL HIGH (ref 39–117)
BUN: 11 mg/dL (ref 6–23)
CO2: 26 mEq/L (ref 19–32)
Calcium: 8.9 mg/dL (ref 8.4–10.5)
Chloride: 106 mEq/L (ref 96–112)
Creatinine, Ser: 1.09 mg/dL (ref 0.40–1.50)
GFR: 92.35 mL/min (ref >60.00)
Glucose, Bld: 125 mg/dL — ABNORMAL HIGH (ref 70–99)
Potassium: 4.4 mEq/L (ref 3.5–5.1)
Sodium: 139 mEq/L (ref 135–145)
Total Bilirubin: 0.5 mg/dL (ref 0.2–1.2)
Total Protein: 7.4 g/dL (ref 6.0–8.3)

## 2017-06-11 LAB — LIPID PANEL
Cholesterol: 159 mg/dL (ref 0–200)
HDL: 33.3 mg/dL — ABNORMAL LOW (ref >39.00)
LDL Cholesterol: 112 mg/dL — ABNORMAL HIGH (ref 0–99)
NonHDL: 125.64
Total CHOL/HDL Ratio: 5
Triglycerides: 67 mg/dL (ref 0.0–149.0)
VLDL: 13.4 mg/dL (ref 0.0–40.0)

## 2017-06-11 LAB — HEMOGLOBIN A1C: Hgb A1c MFr Bld: 6.8 % — ABNORMAL HIGH (ref 4.6–6.5)

## 2017-06-11 LAB — MAGNESIUM: Magnesium: 2.1 mg/dL (ref 1.5–2.5)

## 2017-06-11 LAB — BRAIN NATRIURETIC PEPTIDE: Pro B Natriuretic peptide (BNP): 167 pg/mL — ABNORMAL HIGH (ref 0.0–100.0)

## 2017-06-11 MED ORDER — INSULIN PEN NEEDLE 32G X 6 MM MISC: 1.0000 [IU] | Freq: Every day | 1 refills | Status: DC

## 2017-06-11 MED ORDER — LIRAGLUTIDE 18 MG/3ML ~~LOC~~ SOPN: 1.8000 mg | PEN_INJECTOR | Freq: Every day | SUBCUTANEOUS | 3 refills | Status: DC

## 2017-06-11 MED ORDER — TORSEMIDE 10 MG PO TABS: ORAL_TABLET | ORAL | 0 refills | Status: DC

## 2017-06-11 NOTE — Progress Notes (Signed)
Luis [name] is a 51 y.o. male is here to [place].   Patient Care Team: Briscoe Deutscher, DO as PCP - General (Family Medicine) Harvie Junior, MD (Specialist)   History of Present Illness:   Gertie Exon, CMA, acting as scribe for Dr. Juleen China.  HPI:  Patient comes in to establish care today.    CHF:  Patient states he has had swelling in his feet and ankles since he was in the hospital in May.  Taking Lasix 40 mg daily but states he has doubled it to 80 mg with no change in the swelling.  States there is no change in the swelling throughout the day.  He has not tried elevating his feet.  Has not tried compression stockings since being in the hospital.  Patient states he has not taken his Lasix recently because he ran out.  He has not bothered to get it refilled because he feels it hasn't helped him.  He states he has no pain from the swelling, but it is uncomfortable and makes walking "tedious".  States he doesn't walk unless he absolutely has to due to the discomfort.   He has gained 10 pounds recently.    Hypertension:  Patient states he has seen cardiology regarding his blood pressure.  His medication was changed so that he is sure to take it every 12 hours.  Since then, his blood pressure has improved.    Diabetes:  Patient states he has not been seeing a physician for his diabetes.  He takes Lantus 10 units once daily.  He has not had a recent A1c; will check this today.  He states he has no numbness or tingling in his feet.  He has seen podiatry recently for diabetic foot care.  Health Maintenance Due  Topic Date Due  . PNEUMOCOCCAL POLYSACCHARIDE VACCINE (1) 02/12/1970  . URINE MICROALBUMIN  02/12/1978  . TETANUS/TDAP  02/13/1987  . OPHTHALMOLOGY EXAM  03/17/2017   Depression screen PHQ 2/9 06/11/2017  Decreased Interest 0  Down, Depressed, Hopeless 0  PHQ - 2 Score 0   PMHx, SurgHx, SocialHx, Medications, and Allergies were reviewed in the Visit Navigator and  updated as appropriate.   Past Medical History:  Diagnosis Date  . Abnormal nuclear stress test 03/24/2016  . Atrial fibrillation ([place])   . CHF (congestive heart failure) ([place])   . DM (diabetes mellitus) ([place])   . Essential hypertension   . History of tobacco abuse   . HTN (hypertension)   . Hyperlipidemia   . Morbid obesity ([place])   . OSA (obstructive sleep apnea)   . Pneumonia 2017   Past Surgical History:  Procedure Laterality Date  . CARDIAC CATHETERIZATION N/A 03/24/2016   Procedure: Left Heart Cath and Coronary Angiography;  Surgeon: Leonie Man, MD;  Location: [facility];  Service: Cardiovascular;  Laterality: N/A;  . FEMUR IM NAIL Right 2012  . FRACTURE SURGERY     Family History  Problem Relation Age of Onset  . Diabetes Mellitus II Mother   . Diabetes type II Other   . Hypertension Maternal Grandfather   . Stroke Neg Hx   . Cancer Neg Hx    Social History  Substance Use Topics  . Smoking status: Former Smoker    Packs/day: 0.50    Years: 20.00    Types: Cigarettes    Quit date: 03/02/2009  . Smokeless tobacco: Never Used  . Alcohol use No   Current Medications and Allergies:   .  albuterol (PROVENTIL HFA;VENTOLIN HFA) 108 (90 Base) MCG/ACT inhaler, Inhale 2 puffs into the lungs every 4 (four) hours as needed for wheezing., Disp: 1 Inhaler, Rfl: 0 .  aspirin EC 81 MG EC tablet, Take 1 tablet (81 mg total) by mouth daily., Disp: 30 tablet, Rfl: 0 .  blood glucose meter kit and supplies KIT, Dispense based on patient and insurance preference. Use up to four times daily as directed. (FOR ICD-9 250.00, 250.01)., Disp: 1 each, Rfl: 0 .  fluticasone (FLONASE) 50 MCG/ACT nasal spray, Place 2 sprays into both nostrils daily., Disp: 16 g, Rfl: 3 .  furosemide (LASIX) 40 MG tablet, Take 1 tablet (40 mg total) by mouth daily. (Patient taking differently: Take 40 mg by mouth 2 (two) times daily. For 5 days, today 04/23/17 is day 1.), Disp: 30 tablet, Rfl: 3 .   insulin glargine (LANTUS) 100 UNIT/ML injection, Inject 0.1 mLs (10 Units total) into the skin daily., Disp: 10 mL, Rfl: 11 .  Lancets (ACCU-CHEK SAFE-T PRO) lancets, Use as instructed, Disp: 100 each, Rfl: 12 .  Metoprolol Tartrate 37.5 MG TABS, Take 37.5 mg by mouth 2 (two) times daily., Disp: 60 tablet, Rfl: 2 .  Probiotic Product (PROBIOTIC-10 PO), Take by mouth., Disp: , Rfl:  .  traMADol (ULTRAM) 50 MG tablet, Take 2 tablets (100 mg total) by mouth every 6 (six) hours as needed for moderate pain. (Patient not taking: Reported on 06/11/2017), Disp: 20 tablet, Rfl: 0  Allergies  Allergen Reactions  . Demerol [Meperidine] Itching  . Morphine And Related Itching   Review of Systems:   Pertinent items are noted in the HPI. Otherwise, ROS is negative.  Vitals:   Vitals:   06/11/17 0833  BP: (!) 142/84  Pulse: 61  Temp: 98.1 F (36.7 C)  TempSrc: Oral  SpO2: 96%  Weight: (!) 355 lb 9.6 oz (161.3 kg)  Height: 6' 3" (1.905 m)     Body mass index is 44.45 kg/m.  Physical Exam:   Physical Exam  Constitutional: He is oriented to person, place, and time. He appears well-developed and well-nourished. No distress.  HENT:  Head: Normocephalic and atraumatic.  Right Ear: External ear normal.  Left Ear: External ear normal.  Nose: Nose normal.  Mouth/Throat: Oropharynx is clear and moist.  Eyes: Pupils are equal, round, and reactive to light. Conjunctivae and EOM are normal.  Neck: Normal range of motion. Neck supple.  Cardiovascular: Normal rate, regular rhythm, normal heart sounds and intact distal pulses.   Pulmonary/Chest: Effort normal and breath sounds normal.  Abdominal: Soft. Bowel sounds are normal.  Musculoskeletal: Normal range of motion. He exhibits no edema.  Neurological: He is alert and oriented to person, place, and time.  Skin: Skin is warm and dry.  Psychiatric: He has a normal mood and affect. His behavior is normal. Judgment and thought content normal.  Nursing  note and vitals reviewed.  Results for orders placed or performed in visit on 06/11/17  Hemoglobin A1c  Result Value Ref Range   Hgb A1c MFr Bld 6.8 (H) 4.6 - 6.5 %  CBC with Differential/Platelet  Result Value Ref Range   WBC 6.6 4.0 - 10.5 K/uL   RBC 4.57 4.22 - 5.81 Mil/uL   Hemoglobin 14.3 13.0 - 17.0 g/dL   HCT 43.6 39.0 - 52.0 %   MCV 95.5 78.0 - 100.0 fl   MCHC 32.7 30.0 - 36.0 g/dL   RDW 15.8 (H) 11.5 - 15.5 %   Platelets 279.0  150.0 - 400.0 K/uL   Neutrophils Relative % 55.7 43.0 - 77.0 %   Lymphocytes Relative 32.1 12.0 - 46.0 %   Monocytes Relative 8.2 3.0 - 12.0 %   Eosinophils Relative 3.5 0.0 - 5.0 %   Basophils Relative 0.5 0.0 - 3.0 %   Neutro Abs 3.7 1.4 - 7.7 K/uL   Lymphs Abs 2.1 0.7 - 4.0 K/uL   Monocytes Absolute 0.5 0.1 - 1.0 K/uL   Eosinophils Absolute 0.2 0.0 - 0.7 K/uL   Basophils Absolute 0.0 0.0 - 0.1 K/uL  Comprehensive metabolic panel  Result Value Ref Range   Sodium 139 135 - 145 mEq/L   Potassium 4.4 3.5 - 5.1 mEq/L   Chloride 106 96 - 112 mEq/L   CO2 26 19 - 32 mEq/L   Glucose, Bld 125 (H) 70 - 99 mg/dL   BUN 11 6 - 23 mg/dL   Creatinine, Ser 1.09 0.40 - 1.50 mg/dL   Total Bilirubin 0.5 0.2 - 1.2 mg/dL   Alkaline Phosphatase 130 (H) 39 - 117 U/L   AST 12 0 - 37 U/L   ALT 15 0 - 53 U/L   Total Protein 7.4 6.0 - 8.3 g/dL   Albumin 4.0 3.5 - 5.2 g/dL   Calcium 8.9 8.4 - 10.5 mg/dL   GFR 92.35 >60.00 mL/min  TSH  Result Value Ref Range   TSH 2.24 0.35 - 4.50 uIU/mL  Magnesium  Result Value Ref Range   Magnesium 2.1 1.5 - 2.5 mg/dL  Brain natriuretic peptide  Result Value Ref Range   Pro B Natriuretic peptide (BNP) 167.0 (H) 0.0 - 100.0 pg/mL  Lipid panel  Result Value Ref Range   Cholesterol 159 0 - 200 mg/dL   Triglycerides 67.0 0.0 - 149.0 mg/dL   HDL 33.30 (L) >39.00 mg/dL   VLDL 13.4 0.0 - 40.0 mg/dL   LDL Cholesterol 112 (H) 0 - 99 mg/dL   Total CHOL/HDL Ratio 5    NonHDL 125.64    Assessment and Plan:   Problem    Onychomycosis   Followed by Podiatry.   Bilateral Edema of Lower Extremity   Increased treatment to Demadex. Advised compression stockings, elevation of legs, CP rehab.   Hyperlipidemia   Lab Results  Component Value Date   CHOL 159 06/11/2017   HDL 33.30 (L) 06/11/2017   LDLCALC 112 (H) 06/11/2017   TRIG 67.0 06/11/2017   CHOLHDL 5 06/11/2017   Patient not on statin. Will clarify and add if he is amenable.    Difficulty Walking   Due to edema and deconditioning. PT order for CP rehab.   Osa (Obstructive Sleep Apnea)   Note from 2017: Mild OSA but CPAP recommended. Unclear if patient on CPAP. Will clarify. Titration order in place.    Nicm (Nonischemic Cardiomyopathy) ([facility])   Procedures 03/2016 Left Heart Cath and Coronary Angiography   Conclusion  1. Mid LAD lesion, 35% stenosed. 2. There is moderate left ventricular systolic dysfunction, global hypokinesis - with severely elevated LVEDP 3. Nonischemic/hypertensive cardiomyopathy 4. Combined systolic and diastolic heart failure     Morbid Obesity ([facility])   Wt Readings from Last 3 Encounters:  06/11/17 (!) 355 lb 9.6 oz (161.3 kg)  04/23/17 (!) 343 lb (155.6 kg)  04/21/17 (!) 343 lb 3.2 oz (155.7 kg)     Diabetes Mellitus Type 2 in Obese ([facility])   Current symptoms: no polyuria or polydipsia, no unusual visual symptoms, no medication side effects noted.  Taking medication compliantly  without noted sided effects [x]  YES  []  NO  Episodes of hypoglycemia? []  YES  [x]  NO Maintaining a diabetic diet? [x]  YES  []  NO Trying to exercise on a regular basis? []  YES  [x]  NO  On ACE inhibitor or angiotensin II receptor blocker? []  YES  [x]  NO On Aspirin? [x]  YES  []  NO  Lab Results  Component Value Date   HGBA1C 6.8 (H) 06/11/2017    No results found for: MICROALBUR, [id number]     Diastolic Congestive Heart Failure ([facility])   03/2017 ECHO Study Conclusions  - Left ventricle: The cavity size was moderately  dilated. Wall   thickness was increased in a pattern of moderate LVH. Systolic   function was normal. The estimated ejection fraction was in the   range of 50% to 55%. Wall motion was normal; there were no   regional wall motion abnormalities. Doppler parameters are   consistent with abnormal left ventricular relaxation (grade 1   diastolic dysfunction). - Left atrium: The atrium was mildly dilated.  Impressions:  - Technically difficult; definity used; normal LV systolic   function; mild diastolic dysfunction; moderate LVH; moderate LVE;   mild LAE.   Essential Hypertension   Home blood pressure readings at goal.  Avoiding excessive salt intake? []  YES  [x]  NO Trying to exercise on a regular basis? []  YES  [x]  NO Review: taking medications as instructed, no medication side effects noted, no TIAs, notes stable dyspnea on exertion, no change, noting swelling of ankles.   Wt Readings from Last 3 Encounters:  06/11/17 (!) 355 lb 9.6 oz (161.3 kg)  04/23/17 (!) 343 lb (155.6 kg)  04/21/17 (!) 343 lb 3.2 oz (155.7 kg)   Reports that he quit smoking about 8 years ago. His smoking use included Cigarettes. He has a 10.00 pack-year smoking history.   BP Readings from Last 3 Encounters:  06/11/17 (!) 142/84  04/23/17 (!) 156/110  04/21/17 140/80   Lab Results  Component Value Date   CREATININE 1.09 06/11/2017     Orders Placed This Encounter  Procedures  . Hemoglobin A1c  . CBC with Differential/Platelet  . Comprehensive metabolic panel  . TSH  . Magnesium  . Brain natriuretic peptide  . Lipid panel  . Ambulatory referral to Physical Therapy   Meds ordered this encounter  Medications  . torsemide (DEMADEX) 10 MG tablet    Sig: Take 1 - 2 tablets daily for swelling    Dispense:  60 tablet    Refill:  0  . liraglutide 18 MG/3ML SOPN    Sig: Inject 0.3 mLs (1.8 mg total) into the skin daily.    Dispense:  3 pen    Refill:  3  . Insulin Pen Needle 32G X 6 MM MISC     Sig: 1 Units by Does not apply route daily.    Dispense:  100 each    Refill:  1    DX:  E11.69   . Reviewed expectations re: course of current medical issues. . Discussed self-management of symptoms. . Outlined signs and symptoms indicating need for more acute intervention. . Patient verbalized understanding and all questions were answered. Marland Kitchen Health Maintenance issues including appropriate healthy diet, exercise, and smoking avoidance were discussed with patient. . See orders for this visit as documented in the electronic medical record. . Patient received an After Visit Summary.  CMA served  as scribe during this visit. History, Physical, and Plan performed by medical provider. The above documentation has been reviewed and is accurate and complete. Briscoe Deutscher, D.O.  Briscoe Deutscher, DO [place], [place] 06/14/2017  Future Appointments Date Time Provider [place]  06/25/2017 10:00 AM Briscoe Deutscher, DO [facility] None  07/23/2017 2:00 PM Parrett, Fonnie Mu, NP [facility] None  07/29/2017 8:00 PM [facility] ROOM 5 [facility]  08/19/2017 10:00 AM Gardiner Barefoot, DPM [facility]

## 2017-06-11 NOTE — Patient Instructions (Addendum)
Start torsemide 1 tablet every morning.  Elevate your legs whenever possible, especially at night.  Your feet need to be elevated above the level of your heart.  Try compression stockings.  Put them on in the morning when getting up and try to wear them all day, especially when moving around.  Referral has been placed to physical therapy.  Start Victoza once daily.  Wait until tomorrow before starting.  If it is too expensive, please let us know.  It may require prior authorization through your insurance.  This can take a couple of days to complete.

## 2017-06-11 NOTE — Telephone Encounter (Signed)
PA for Victoza completed and approved.  Case ID:  [id number]  Expires:  06/11/2019.

## 2017-06-14 ENCOUNTER — Encounter: Payer: Self-pay | Admitting: Family Medicine

## 2017-06-14 DIAGNOSIS — B351 Tinea unguium: Secondary | ICD-10-CM | POA: Insufficient documentation

## 2017-06-14 DIAGNOSIS — E1169 Type 2 diabetes mellitus with other specified complication: Secondary | ICD-10-CM | POA: Insufficient documentation

## 2017-06-14 DIAGNOSIS — R600 Localized edema: Secondary | ICD-10-CM | POA: Insufficient documentation

## 2017-06-14 DIAGNOSIS — E785 Hyperlipidemia, unspecified: Secondary | ICD-10-CM

## 2017-06-16 ENCOUNTER — Emergency Department (HOSPITAL_COMMUNITY)
Admission: EM | Admit: 2017-06-16 | Discharge: 2017-06-16 | Disposition: A | Discharge status: Home / Self Care | Payer: BLUE CROSS/BLUE SHIELD | Attending: Physician Assistant | Admitting: Physician Assistant

## 2017-06-16 ENCOUNTER — Encounter (HOSPITAL_COMMUNITY): Payer: Self-pay

## 2017-06-16 DIAGNOSIS — I110 Hypertensive heart disease with heart failure: Secondary | ICD-10-CM | POA: Insufficient documentation

## 2017-06-16 DIAGNOSIS — Z7984 Long term (current) use of oral hypoglycemic drugs: Secondary | ICD-10-CM | POA: Insufficient documentation

## 2017-06-16 DIAGNOSIS — Z79899 Other long term (current) drug therapy: Secondary | ICD-10-CM | POA: Insufficient documentation

## 2017-06-16 DIAGNOSIS — Y9389 Activity, other specified: Secondary | ICD-10-CM | POA: Insufficient documentation

## 2017-06-16 DIAGNOSIS — E785 Hyperlipidemia, unspecified: Secondary | ICD-10-CM | POA: Diagnosis not present

## 2017-06-16 DIAGNOSIS — E119 Type 2 diabetes mellitus without complications: Secondary | ICD-10-CM | POA: Insufficient documentation

## 2017-06-16 DIAGNOSIS — Y929 Unspecified place or not applicable: Secondary | ICD-10-CM | POA: Insufficient documentation

## 2017-06-16 DIAGNOSIS — X58XXXA Exposure to other specified factors, initial encounter: Secondary | ICD-10-CM | POA: Insufficient documentation

## 2017-06-16 DIAGNOSIS — Y999 Unspecified external cause status: Secondary | ICD-10-CM | POA: Insufficient documentation

## 2017-06-16 DIAGNOSIS — I509 Heart failure, unspecified: Secondary | ICD-10-CM | POA: Insufficient documentation

## 2017-06-16 DIAGNOSIS — Z87891 Personal history of nicotine dependence: Secondary | ICD-10-CM | POA: Insufficient documentation

## 2017-06-16 DIAGNOSIS — T161XXA Foreign body in right ear, initial encounter: Secondary | ICD-10-CM | POA: Diagnosis present

## 2017-06-16 DIAGNOSIS — H9391 Unspecified disorder of right ear: Secondary | ICD-10-CM | POA: Diagnosis not present

## 2017-06-16 DIAGNOSIS — H61891 Other specified disorders of right external ear: Secondary | ICD-10-CM | POA: Diagnosis not present

## 2017-06-16 NOTE — ED Provider Notes (Signed)
[facility] DEPT Provider Note   CSN: [id number] Arrival date & time: 06/16/17  1220     History   Chief Complaint Chief Complaint  Patient presents with  . Foreign Body in [name] is a 51 y.o. male.  HPI   51 year old male presents today with foreign body sensation in his right ear. Patient reports this happened last night after watching his son's football practice. He notes he felt like "something was crawling around in his ear". Denies any discharge, swelling, warmth, known foreign body placed in the ear, or changes in his hearing. Patient denies any other complaints today.  Past Medical History:  Diagnosis Date  . Abnormal nuclear stress test 03/24/2016  . Atrial fibrillation ([place])   . CHF (congestive heart failure) ([place])   . DM (diabetes mellitus) ([place])   . Essential hypertension   . History of tobacco abuse   . HTN (hypertension)   . Hyperlipidemia   . Morbid obesity ([place])   . OSA (obstructive sleep apnea)   . Pneumonia 2017    Patient Active Problem List   Diagnosis Date Noted  . Onychomycosis 06/14/2017  . Bilateral edema of lower extremity 06/14/2017  . Hyperlipidemia   . Difficulty walking 06/11/2017  . OSA (obstructive sleep apnea) 03/31/2017  . Debility 03/17/2017  . NICM (nonischemic cardiomyopathy) ([place])   . Morbid obesity ([place])   . Former smoker   . Diabetes mellitus type 2 in obese ([place])   . Diastolic congestive heart failure ([place])   . Essential hypertension 03/19/2016  . Paroxysmal atrial fibrillation ([place]) 03/19/2016    Past Surgical History:  Procedure Laterality Date  . CARDIAC CATHETERIZATION N/A 03/24/2016   Procedure: Left Heart Cath and Coronary Angiography;  Surgeon: Leonie Man, MD;  Location: [facility];  Service: Cardiovascular;  Laterality: N/A;  . FEMUR IM NAIL Right 2012  . FRACTURE SURGERY         Home Medications    Prior to Admission medications   Medication Sig Start Date End Date  Taking? Authorizing Provider  albuterol (PROVENTIL HFA;VENTOLIN HFA) 108 (90 Base) MCG/ACT inhaler Inhale 2 puffs into the lungs every 4 (four) hours as needed for wheezing. 03/23/17   Angiulli, Lavon Paganini, PA-C  aspirin EC 81 MG EC tablet Take 1 tablet (81 mg total) by mouth daily. 03/26/16   Theodis Blaze, MD  blood glucose meter kit and supplies KIT Dispense based on patient and insurance preference. Use up to four times daily as directed. (FOR ICD-9 250.00, 250.01). 03/23/17   Angiulli, Lavon Paganini, PA-C  fluticasone (FLONASE) 50 MCG/ACT nasal spray Place 2 sprays into both nostrils daily. 03/23/17   Angiulli, Lavon Paganini, PA-C  furosemide (LASIX) 40 MG tablet Take 1 tablet (40 mg total) by mouth daily. Patient taking differently: Take 40 mg by mouth 2 (two) times daily. For 5 days, today 04/23/17 is day 1. 03/26/17 06/24/17  Burnell Blanks, MD  insulin glargine (LANTUS) 100 UNIT/ML injection Inject 0.1 mLs (10 Units total) into the skin daily. 03/23/17   Angiulli, Lavon Paganini, PA-C  Insulin Pen Needle 32G X 6 MM MISC 1 Units by Does not apply route daily. 06/11/17   Briscoe Deutscher, DO  Lancets (ACCU-CHEK SAFE-T PRO) lancets Use as instructed 03/23/17   Angiulli, Lavon Paganini, PA-C  liraglutide 18 MG/3ML SOPN Inject 0.3 mLs (1.8 mg total) into the skin daily. 06/11/17   Briscoe Deutscher, DO  Metoprolol Tartrate 37.5 MG TABS Take  37.5 mg by mouth 2 (two) times daily. 04/21/17   [place] Circle, FNP  Probiotic Product (PROBIOTIC-10 PO) Take by mouth.    [provider]  torsemide (DEMADEX) 10 MG tablet Take 1 - 2 tablets daily for swelling 06/11/17   Briscoe Deutscher, DO  traMADol (ULTRAM) 50 MG tablet Take 2 tablets (100 mg total) by mouth every 6 (six) hours as needed for moderate pain. Patient not taking: Reported on 06/11/2017 03/23/17   Angiulli, Lavon Paganini, PA-C    Family History Family History  Problem Relation Age of Onset  . Diabetes Mellitus II Mother   . Diabetes type II Other   . Hypertension  Maternal Grandfather   . Stroke Neg Hx   . Cancer Neg Hx     Social History Social History  Substance Use Topics  . Smoking status: Former Smoker    Packs/day: 0.50    Years: 20.00    Types: Cigarettes    Quit date: 03/02/2009  . Smokeless tobacco: Never Used  . Alcohol use No     Allergies   Demerol [meperidine] and Morphine and related   Review of Systems Review of Systems  All other systems reviewed and are negative.    Physical Exam Updated Vital Signs BP 132/83 (BP Location: Right Arm)   Pulse 81   Temp 98 F (36.7 C) (Oral)   Resp 19   Ht '6\' 3"'$  (1.905 m)   Wt (!) 161 kg (355 lb)   SpO2 96%   BMI 44.37 kg/m   Physical Exam  Constitutional: He is oriented to person, place, and time. He appears well-developed and well-nourished.  HENT:  Head: Normocephalic and atraumatic.  Bilateral ears without signs of swelling or edema, no discharge, external auditory canals clear with no irritation, redness, TMs normal no foreign bodies-   Eyes: Pupils are equal, round, and reactive to light. Conjunctivae are normal. Right eye exhibits no discharge. Left eye exhibits no discharge. No scleral icterus.  Neck: Normal range of motion. No JVD present. No tracheal deviation present.  Pulmonary/Chest: Effort normal. No stridor.  Neurological: He is alert and oriented to person, place, and time. Coordination normal.  Psychiatric: He has a normal mood and affect. His behavior is normal. Judgment and thought content normal.  Nursing note and vitals reviewed.    ED Treatments / Results  Labs (all labs ordered are listed, but only abnormal results are displayed) Labs Reviewed - No data to display  EKG  EKG Interpretation None       Radiology No results found.  Procedures Procedures (including critical care time)  Medications Ordered in ED Medications - No data to display   Initial Impression / Assessment and Plan / ED Course  I have reviewed the triage vital  signs and the nursing notes.  Pertinent labs & imaging results that were available during my care of the patient were reviewed by me and considered in my medical decision making (see chart for details).     50 year old male presents today with foreign body sensation in his right ear. No obvious foreign bodies, no cerumen impaction, no infection. Small amount of adjuvant peroxide and water was placed in the right ear and drained, patient reports no further symptoms after procedure. Patient will follow up as an outpatient with the primary care if continues to have the sensation, return as needed. Return precautions given. He verbalized understanding and agreement to today's plan. Extremities and  Final Clinical Impressions(s) / ED Diagnoses  Final diagnoses:  Foreign body sensation in right ear canal    New Prescriptions New Prescriptions   No medications on file     Okey Regal, Hershal Coria 06/16/17 1316    [place], Fredia Sorrow, MD 06/16/17 1651

## 2017-06-16 NOTE — Discharge Instructions (Signed)
Please read attached information. If you experience any new or worsening signs or symptoms please return to the emergency room for evaluation. Please follow-up with your primary care provider or specialist as discussed.  °

## 2017-06-16 NOTE — ED Triage Notes (Signed)
Per Pt, Pt is coming from home with complaints of foreign body in his right ear. Reports feeling the movement of what he believes to be an insect. Pt denies pain.

## 2017-06-17 DIAGNOSIS — J9600 Acute respiratory failure, unspecified whether with hypoxia or hypercapnia: Secondary | ICD-10-CM | POA: Diagnosis not present

## 2017-06-17 DIAGNOSIS — R0600 Dyspnea, unspecified: Secondary | ICD-10-CM | POA: Diagnosis not present

## 2017-06-17 NOTE — Telephone Encounter (Signed)
Patient returning phone call, I did not see a note in epic. Patient needs to discuss the medications that are ready for pick up at the pharmacy and clarification on all of his insulin medications. Call patient to advise.

## 2017-06-19 NOTE — Telephone Encounter (Signed)
LM to return call.

## 2017-06-22 NOTE — Telephone Encounter (Signed)
Patient needs a call as soon as possible to discuss his medications. The patient has not began any of the medications that were sent in 06/11/17 due to the pharmacy not having them. Patient is very concerned and is requesting a call before 12pm tomorrow. Patient is awaiting the call.

## 2017-06-23 ENCOUNTER — Other Ambulatory Visit: Payer: Self-pay

## 2017-06-23 DIAGNOSIS — E669 Obesity, unspecified: Principal | ICD-10-CM

## 2017-06-23 DIAGNOSIS — I509 Heart failure, unspecified: Secondary | ICD-10-CM

## 2017-06-23 DIAGNOSIS — E1169 Type 2 diabetes mellitus with other specified complication: Secondary | ICD-10-CM

## 2017-06-23 MED ORDER — LIRAGLUTIDE 18 MG/3ML ~~LOC~~ SOPN
1.8000 mg | PEN_INJECTOR | Freq: Every day | SUBCUTANEOUS | 3 refills | Status: DC
Start: 2017-06-23 — End: 2017-07-16

## 2017-06-23 MED ORDER — TORSEMIDE 10 MG PO TABS: ORAL_TABLET | ORAL | 0 refills | Status: DC

## 2017-06-23 MED ORDER — INSULIN PEN NEEDLE 32G X 6 MM MISC: 1.0000 [IU] | Freq: Every day | 1 refills | Status: DC

## 2017-06-23 NOTE — Telephone Encounter (Signed)
Patient called in and stated that the Victoza that has been prescribed is to much. I called the pharmacy and they stated that it is $146.57. $ 100 of that is going towards his deductible. Patient said that he can't afford this and wanted to know if he can be switched to something else. He also stated that he was almost out of his insulin and wanted to know if he should continue to take this. I advised patient to not stop anything unless Dr. Earlene Plater advises. I told him that I would call as soon as I speak with Dr. Earlene Plater about this.   Patient also said that his Metoprolol has jumped up from $10 to $74.13. I spoke with the pharmacy and they could not give me a reason for the increase. I advised patient to contact his insurance company. He would like to know if he can be changed to something else if the cost stays that much. I advised that I would speak with Dr. Earlene Plater about this as well.

## 2017-06-23 NOTE — Telephone Encounter (Signed)
Patient states pharmacy had his needles but not his Victoza pen or his demedex prescriptions.  I have re-sent these prescriptions and will contact pharmacy when they open to make sure they have received them.

## 2017-06-23 NOTE — Telephone Encounter (Signed)
Pharmacy has prescriptions ready for patient.

## 2017-06-24 NOTE — Telephone Encounter (Signed)
Let's try Trulicity - see if coupon would work. Low dose first (4 pens) then increase.   See if Metoprolol is cheaper as BID?

## 2017-06-24 NOTE — Telephone Encounter (Signed)
I called patients insurance company [employer] and they could not help with what would be covered. They advised that we would have to send in to see what would be covered. They also stated that he is supposed to go through [employer] for his medications. I ask if we sent it in to them if it would be cheaper for the patient. They could not tell me this. I tried to call the patient to inform him of this information and his mother said he would call back. We will wait for return call from the patient before we move forward with this.

## 2017-06-24 NOTE — Telephone Encounter (Signed)
Spoke with patients pharmacy and they looked up the cost of the Trulicity They said that it would need a PA. The pharmacy said that it may be easier to contact patients pharmacy to see what is covered.

## 2017-06-25 ENCOUNTER — Ambulatory Visit: Payer: BLUE CROSS/BLUE SHIELD | Admitting: Family Medicine

## 2017-07-02 ENCOUNTER — Ambulatory Visit: Payer: BLUE CROSS/BLUE SHIELD

## 2017-07-16 ENCOUNTER — Ambulatory Visit (INDEPENDENT_AMBULATORY_CARE_PROVIDER_SITE_OTHER): Payer: BLUE CROSS/BLUE SHIELD | Admitting: Family Medicine

## 2017-07-16 ENCOUNTER — Encounter: Payer: Self-pay | Admitting: Family Medicine

## 2017-07-16 VITALS — BP 138/84 | HR 89 | Temp 98.9°F | Ht 75.0 in | Wt 356.6 lb

## 2017-07-16 DIAGNOSIS — R600 Localized edema: Secondary | ICD-10-CM

## 2017-07-16 DIAGNOSIS — E119 Type 2 diabetes mellitus without complications: Secondary | ICD-10-CM

## 2017-07-16 DIAGNOSIS — Z23 Encounter for immunization: Secondary | ICD-10-CM | POA: Diagnosis not present

## 2017-07-16 DIAGNOSIS — E669 Obesity, unspecified: Secondary | ICD-10-CM

## 2017-07-16 DIAGNOSIS — E1169 Type 2 diabetes mellitus with other specified complication: Secondary | ICD-10-CM | POA: Diagnosis not present

## 2017-07-16 LAB — MICROALBUMIN / CREATININE URINE RATIO
Creatinine,U: 131.3 mg/dL
Microalb Creat Ratio: 0.5 mg/g (ref 0.0–30.0)
Microalb, Ur: <0.7 mg/dL (ref 0.0–1.9)

## 2017-07-16 LAB — COMPREHENSIVE METABOLIC PANEL
ALT: 13 U/L (ref 0–53)
AST: 12 U/L (ref 0–37)
Albumin: 4.1 g/dL (ref 3.5–5.2)
Alkaline Phosphatase: 140 U/L — ABNORMAL HIGH (ref 39–117)
BUN: 19 mg/dL (ref 6–23)
CO2: 26 mEq/L (ref 19–32)
Calcium: 9.2 mg/dL (ref 8.4–10.5)
Chloride: 105 mEq/L (ref 96–112)
Creatinine, Ser: 1.23 mg/dL (ref 0.40–1.50)
GFR: 80.29 mL/min (ref >60.00)
Glucose, Bld: 148 mg/dL — ABNORMAL HIGH (ref 70–99)
Potassium: 3.9 mEq/L (ref 3.5–5.1)
Sodium: 139 mEq/L (ref 135–145)
Total Bilirubin: 0.5 mg/dL (ref 0.2–1.2)
Total Protein: 7.4 g/dL (ref 6.0–8.3)

## 2017-07-16 MED ORDER — DULAGLUTIDE 0.75 MG/0.5ML ~~LOC~~ SOAJ: 1.0000 "pen " | SUBCUTANEOUS | 0 refills | Status: DC

## 2017-07-16 MED ORDER — DULAGLUTIDE 1.5 MG/0.5ML ~~LOC~~ SOAJ: 1.0000 "pen " | SUBCUTANEOUS | 3 refills | Status: DC

## 2017-07-16 NOTE — Patient Instructions (Signed)
-    Start vitamin B12 1000mcg daily

## 2017-07-16 NOTE — Progress Notes (Signed)
Luis [name] is a 51 y.o. male is here for follow up.  History of Present Illness:   Water quality scientist, CMA, acting as scribe for Dr. Juleen China.  HPI:  Patient comes in today for follow up.    He is not taking Victoza.  He was not able to afford it.  He states it cost over $140 after insurance.    1. Diabetes mellitus type 2 in obese ([place]).  Current symptoms: no polyuria or polydipsia, no chest pain, dyspnea or TIA's, no numbness, tingling or pain in extremities.  Taking medication compliantly without noted sided effects [x]  YES  []  NO  Episodes of hypoglycemia? []  YES  [x]  NO Maintaining a diabetic diet? []  YES  [x]  NO Trying to exercise on a regular basis? []  YES  [x]  NO  On ACE inhibitor or angiotensin II receptor blocker? []  YES  [x]  NO On Aspirin? [x]  YES  []  NO  Lab Results  Component Value Date   HGBA1C 6.8 (H) 06/11/2017    Lab Results  Component Value Date   MICROALBUR <0.7 07/16/2017    Lab Results  Component Value Date   CHOL 159 06/11/2017   HDL 33.30 (L) 06/11/2017   LDLCALC 112 (H) 06/11/2017   TRIG 67.0 06/11/2017   CHOLHDL 5 06/11/2017     Wt Readings from Last 3 Encounters:  07/16/17 (!) 356 lb 9.6 oz (161.8 kg)  06/16/17 (!) 355 lb (161 kg)  06/11/17 (!) 355 lb 9.6 oz (161.3 kg)   BP Readings from Last 3 Encounters:  07/16/17 138/84  06/16/17 (!) 129/97  06/11/17 (!) 142/84   Lab Results  Component Value Date   CREATININE 1.23 07/16/2017    2. Morbid obesity ([place]).   Wt Readings from Last 3 Encounters:  07/16/17 (!) 356 lb 9.6 oz (161.8 kg)  06/16/17 (!) 355 lb (161 kg)  06/11/17 (!) 355 lb 9.6 oz (161.3 kg)    3. Bilateral edema of lower extremity. Patient states he has been taking his torsemide 10 mg BID.  Swelling in his feet and lower legs has decreased somewhat, per patient, but has not gone away completely.  He states he is not quite as uncomfortable when walking.   Health Maintenance Due  Topic Date Due  . PNEUMOCOCCAL  POLYSACCHARIDE VACCINE (1) 02/12/1970  . TETANUS/TDAP  02/13/1987  . OPHTHALMOLOGY EXAM  03/17/2017   Depression screen PHQ 2/9 06/11/2017  Decreased Interest 0  Down, Depressed, Hopeless 0  PHQ - 2 Score 0   PMHx, SurgHx, SocialHx, FamHx, Medications, and Allergies were reviewed in the Visit Navigator and updated as appropriate.   Patient Active Problem List   Diagnosis Date Noted  . Onychomycosis 06/14/2017  . Bilateral edema of lower extremity 06/14/2017  . Hyperlipidemia   . Difficulty walking 06/11/2017  . OSA (obstructive sleep apnea) 03/31/2017  . Debility 03/17/2017  . NICM (nonischemic cardiomyopathy) ([place])   . Morbid obesity ([place])   . Former smoker   . Diabetes mellitus type 2 in obese ([place])   . Diastolic congestive heart failure ([place])   . Essential hypertension 03/19/2016  . Paroxysmal atrial fibrillation ([place]) 03/19/2016   Social History  Substance Use Topics  . Smoking status: Former Smoker    Packs/day: 0.50    Years: 20.00    Types: Cigarettes    Quit date: 03/02/2009  . Smokeless tobacco: Never Used  . Alcohol use No   Current  Medications and Allergies:   .  albuterol (PROVENTIL HFA;VENTOLIN HFA) 108 (90 Base) MCG/ACT inhaler, Inhale 2 puffs into the lungs every 4 (four) hours as needed for wheezing., Disp: 1 Inhaler, Rfl: 0 .  aspirin EC 81 MG EC tablet, Take 1 tablet (81 mg total) by mouth daily., Disp: 30 tablet, Rfl: 0 .  blood glucose meter kit and supplies KIT, Dispense based on patient and insurance preference. Use up to four times daily as directed. (FOR ICD-9 250.00, 250.01)., Disp: 1 each, Rfl: 0 .  fluticasone (FLONASE) 50 MCG/ACT nasal spray, Place 2 sprays into both nostrils daily., Disp: 16 g, Rfl: 3 .  insulin glargine (LANTUS) 100 UNIT/ML injection, Inject 0.1 mLs (10 Units total) into the skin daily., Disp: 10 mL, Rfl: 11 .  Insulin Pen Needle 32G X 6 MM MISC, 1 Units by Does not apply route daily., Disp: 100 each, Rfl: 1 .  Lancets  (ACCU-CHEK SAFE-T PRO) lancets, Use as instructed, Disp: 100 each, Rfl: 12 .  Metoprolol Tartrate 37.5 MG TABS, Take 37.5 mg by mouth 2 (two) times daily., Disp: 60 tablet, Rfl: 2 .  Probiotic Product (PROBIOTIC-10 PO), Take by mouth., Disp: , Rfl:  .  torsemide (DEMADEX) 10 MG tablet, Take 1 - 2 tablets daily for swelling, Disp: 60 tablet, Rfl: 0 .  traMADol (ULTRAM) 50 MG tablet, Take 2 tablets (100 mg total) by mouth every 6 (six) hours as needed for moderate pain., Disp: 20 tablet, Rfl: 0 .  furosemide (LASIX) 40 MG tablet, Take 1 tablet (40 mg total) by mouth daily. (Patient taking differently: Take 40 mg by mouth 2 (two) times daily. For 5 days, today 04/23/17 is day 1.), Disp: 30 tablet, Rfl: 3  Allergies  Allergen Reactions  . Demerol [Meperidine] Itching  . Morphine And Related Itching   Review of Systems   Pertinent items are noted in the HPI. Otherwise, ROS is negative.  Vitals:   Vitals:   07/16/17 0846  BP: 138/84  Pulse: 89  Temp: 98.9 F (37.2 C)  TempSrc: Oral  SpO2: 94%  Weight: (!) 356 lb 9.6 oz (161.8 kg)  Height: 6' 3" (1.905 m)     Body mass index is 44.57 kg/m.   Physical Exam:   Physical Exam  Constitutional: He is oriented to person, place, and time. He appears well-developed and well-nourished. No distress.  HENT:  Head: Normocephalic and atraumatic.  Eyes: Pupils are equal, round, and reactive to light. Conjunctivae and EOM are normal.  Neck: Normal range of motion. Neck supple.  Cardiovascular: Normal rate, regular rhythm, normal heart sounds and intact distal pulses.   Pulmonary/Chest: Effort normal and breath sounds normal.  Abdominal: Soft. Bowel sounds are normal.  Musculoskeletal: Normal range of motion.  Improved edema.  Neurological: He is alert and oriented to person, place, and time.  Skin: Skin is warm and dry.  Psychiatric: He has a normal mood and affect. His behavior is normal. Judgment and thought content normal.  Nursing note  and vitals reviewed.   Results for orders placed or performed in visit on 06/11/17  Hemoglobin A1c  Result Value Ref Range   Hgb A1c MFr Bld 6.8 (H) 4.6 - 6.5 %  CBC with Differential/Platelet  Result Value Ref Range   WBC 6.6 4.0 - 10.5 K/uL   RBC 4.57 4.22 - 5.81 Mil/uL   Hemoglobin 14.3 13.0 - 17.0 g/dL   HCT 43.6 39.0 - 52.0 %   MCV 95.5 78.0 - 100.0 fl  MCHC 32.7 30.0 - 36.0 g/dL   RDW 15.8 (H) 11.5 - 15.5 %   Platelets 279.0 150.0 - 400.0 K/uL   Neutrophils Relative % 55.7 43.0 - 77.0 %   Lymphocytes Relative 32.1 12.0 - 46.0 %   Monocytes Relative 8.2 3.0 - 12.0 %   Eosinophils Relative 3.5 0.0 - 5.0 %   Basophils Relative 0.5 0.0 - 3.0 %   Neutro Abs 3.7 1.4 - 7.7 K/uL   Lymphs Abs 2.1 0.7 - 4.0 K/uL   Monocytes Absolute 0.5 0.1 - 1.0 K/uL   Eosinophils Absolute 0.2 0.0 - 0.7 K/uL   Basophils Absolute 0.0 0.0 - 0.1 K/uL  Comprehensive metabolic panel  Result Value Ref Range   Sodium 139 135 - 145 mEq/L   Potassium 4.4 3.5 - 5.1 mEq/L   Chloride 106 96 - 112 mEq/L   CO2 26 19 - 32 mEq/L   Glucose, Bld 125 (H) 70 - 99 mg/dL   BUN 11 6 - 23 mg/dL   Creatinine, Ser 1.09 0.40 - 1.50 mg/dL   Total Bilirubin 0.5 0.2 - 1.2 mg/dL   Alkaline Phosphatase 130 (H) 39 - 117 U/L   AST 12 0 - 37 U/L   ALT 15 0 - 53 U/L   Total Protein 7.4 6.0 - 8.3 g/dL   Albumin 4.0 3.5 - 5.2 g/dL   Calcium 8.9 8.4 - 10.5 mg/dL   GFR 92.35 >60.00 mL/min  TSH  Result Value Ref Range   TSH 2.24 0.35 - 4.50 uIU/mL  Magnesium  Result Value Ref Range   Magnesium 2.1 1.5 - 2.5 mg/dL  Brain natriuretic peptide  Result Value Ref Range   Pro B Natriuretic peptide (BNP) 167.0 (H) 0.0 - 100.0 pg/mL  Lipid panel  Result Value Ref Range   Cholesterol 159 0 - 200 mg/dL   Triglycerides 67.0 0.0 - 149.0 mg/dL   HDL 33.30 (L) >39.00 mg/dL   VLDL 13.4 0.0 - 40.0 mg/dL   LDL Cholesterol 112 (H) 0 - 99 mg/dL   Total CHOL/HDL Ratio 5    NonHDL 125.64    Assessment and Plan:   Graysin was seen  today for follow-up.  Diagnoses and all orders for this visit:  Diabetes mellitus type 2 in obese ([facility]) Comments: We'll trial Trulicity. Goal will be to get him off of insulin. Orders: -     Dulaglutide (TRULICITY) 0.75 MG/0.5ML SOPN; Inject 1 pen into the skin once a week. -     Microalbumin / creatinine urine ratio -     Comprehensive metabolic panel  Need for immunization against influenza -     Flu Vaccine QUAD 36+ mos IM  Morbid obesity ([facility]) Comments: We'll have patient start walking now that his feet are not hurting so much due to edema. Referral to PT.  Bilateral edema of lower extremity Comments: Improved with use of torsemide. Will recheck BMP today. Continue current dose.  . Reviewed expectations re: course of current medical issues. . Discussed self-management of symptoms. . Outlined signs and symptoms indicating need for more acute intervention. . Patient verbalized understanding and all questions were answered. . Health Maintenance issues including appropriate healthy diet, exercise, and smoking avoidance were discussed with patient. . See orders for this visit as documented in the electronic medical record. . Patient received an After Visit Summary.  CMA served as scribe during this visit. History, Physical, and Plan performed by medical provider. The above documentation has been reviewed and is accurate and   complete. Erica Wallace, D.O.  Erica Wallace, DO [place] 07/25/2017  Future Appointments Date Time Provider Department Center  07/29/2017 8:00 PM [facility] 5 [facility]  08/13/2017 8:30 AM Wallace, Erica, DO [facility] None  08/19/2017 10:00 AM Mayer, Gregory, DPM [facility]       

## 2017-07-18 DIAGNOSIS — R0600 Dyspnea, unspecified: Secondary | ICD-10-CM | POA: Diagnosis not present

## 2017-07-18 DIAGNOSIS — J9600 Acute respiratory failure, unspecified whether with hypoxia or hypercapnia: Secondary | ICD-10-CM | POA: Diagnosis not present

## 2017-07-22 ENCOUNTER — Other Ambulatory Visit: Payer: Self-pay

## 2017-07-22 ENCOUNTER — Telehealth: Payer: Self-pay | Admitting: Family Medicine

## 2017-07-22 DIAGNOSIS — E669 Obesity, unspecified: Secondary | ICD-10-CM

## 2017-07-22 DIAGNOSIS — I509 Heart failure, unspecified: Secondary | ICD-10-CM

## 2017-07-22 DIAGNOSIS — E1169 Type 2 diabetes mellitus with other specified complication: Secondary | ICD-10-CM

## 2017-07-22 MED ORDER — TORSEMIDE 10 MG PO TABS: ORAL_TABLET | ORAL | 0 refills | Status: DC

## 2017-07-22 MED ORDER — DULAGLUTIDE 1.5 MG/0.5ML ~~LOC~~ SOAJ: 1.0000 "pen " | SUBCUTANEOUS | 3 refills | Status: DC

## 2017-07-22 MED ORDER — METOPROLOL TARTRATE 37.5 MG PO TABS: 37.5000 mg | ORAL_TABLET | Freq: Two times a day (BID) | ORAL | 0 refills | Status: DC

## 2017-07-22 NOTE — Telephone Encounter (Signed)
Patient called to get medications refilled. I had to put patient on hold and he hung up prior to giving the information needed.

## 2017-07-22 NOTE — Telephone Encounter (Signed)
Refills have been sent to [facility]

## 2017-07-22 NOTE — Telephone Encounter (Signed)
MEDICATION:   torsemide (DEMADEX) 10 MG tablet [[id number]]    Trulicity - unsure of which one  Metoprolol Tartrate 37.5 MG TABS [[id number]]     PHARMACY:   [facility] - [place], [place] - [address] [phone] (Phone) [phone] (Fax)     IS THIS A 90 DAY SUPPLY :  Yes   IS PATIENT OUT OF MEDICATION:  No  IF NOT; HOW MUCH IS LEFT: "Just a few days"  LAST APPOINTMENT DATE: @9 /13/2018  NEXT APPOINTMENT DATE:@10 /09/2017  OTHER COMMENTS:  Patient unsure of 90 day supply or not.   **Let patient know to contact pharmacy at the end of the day to make sure medication is ready. **  ** Please notify patient to allow 48-72 hours to process**  **Encourage patient to contact the pharmacy for refills or they can request refills through [facility]**

## 2017-07-23 ENCOUNTER — Ambulatory Visit: Payer: BLUE CROSS/BLUE SHIELD | Admitting: Adult Health

## 2017-07-25 NOTE — Progress Notes (Signed)
The 10-year ASCVD risk score Denman George DC Montez Hageman., et al., 2013) is: 18.4%   Values used to calculate the score:     Age: 51 years     Sex: Male     Is Non-Hispanic African American: Yes     Diabetic: Yes     Tobacco smoker: No     Systolic Blood Pressure: 138 mmHg     Is BP treated: Yes     HDL Cholesterol: 33.3 mg/dL     Total Cholesterol: 159 mg/dL   TO DO (FOR Mellie Buccellato TEAM):  WILL NEED TO START STATIN. WILL OKAY WITH PATIENT. WILL NEED MAKE SURE CPAP ADJUSTED.  CONSIDER ACE/ARB IN FUTURE.  CONSIDER INCREASING TORSEMIDE. FOLLOW PRIOR AUTHORIZATION OF TRULICITY.  Helane Rima, D.O. Family Medicine [employer], [facility]

## 2017-07-28 NOTE — Telephone Encounter (Signed)
Received letter via fax yesterday.  They just needed correct diagnosis for patient.  This was provided and faxed back.  Waiting now for decision.

## 2017-07-28 NOTE — Telephone Encounter (Signed)
Patient calling b/c Metoprolol Tartrate 37.5 MG TABS  Dulaglutide and  (TRULICITY) 1.5 MG/0.5ML SOPN [[id number]] needs additional authorization from Dr. Earlene Plater.   Received a letter from [facility] with the ways in which it can be authorized.  Please call patient once it's resolved.  Toll Free:[phone] 24hrs a day 7 days wk

## 2017-07-29 ENCOUNTER — Ambulatory Visit (HOSPITAL_BASED_OUTPATIENT_CLINIC_OR_DEPARTMENT_OTHER): Payer: BLUE CROSS/BLUE SHIELD | Attending: Pulmonary Disease | Admitting: Pulmonary Disease

## 2017-07-29 VITALS — Ht 75.0 in | Wt 354.6 lb

## 2017-07-29 DIAGNOSIS — G4733 Obstructive sleep apnea (adult) (pediatric): Secondary | ICD-10-CM | POA: Insufficient documentation

## 2017-07-29 DIAGNOSIS — R0600 Dyspnea, unspecified: Secondary | ICD-10-CM | POA: Insufficient documentation

## 2017-07-31 ENCOUNTER — Telehealth: Payer: Self-pay | Admitting: Family Medicine

## 2017-07-31 NOTE — Telephone Encounter (Signed)
I called and spoke with the patient and advised that he would need to call [facility] to find out about the delivery of the medication. I also advised that the pharmacy would be able to tell him more information about the Trulicity being ok after being frozen.

## 2017-07-31 NOTE — Telephone Encounter (Signed)
Trulicity has been approved for this patient through 07/26/2020.  [facility] is already aware.

## 2017-07-31 NOTE — Telephone Encounter (Signed)
Patient's grandson mistakenly put his Dulaglutide (TRULICITY) 1.5 MG/0.5ML SOPN into the freezer at 9pm last night and he took it out at 9am this morning. Can the patient take the medication still or does he have to order another medication. Call to advise. Patient has one more left which will last 1 week from today.   Also, the Metoprolol Tartrate 37.5 MG TABS and torsemide (DEMADEX) 10 MG tablet has not been delivered to him and he has been without the Metoprolol for a week. Patient needs it from  [facility] - [place], [place] - [address] [phone] (Phone) [phone] (Fax)   Call patient to advise at [phone] as soon as possible.

## 2017-08-03 ENCOUNTER — Telehealth: Payer: Self-pay | Admitting: *Deleted

## 2017-08-03 NOTE — Telephone Encounter (Signed)
Patient is not taking Victoza.  Patient is taking Trulicity.  No prior [name] is needed for Victoza.

## 2017-08-03 NOTE — Telephone Encounter (Signed)
Amy at [employer] is calling for a prior auth for Victoza. The insurance that the patients has voided all previous prior auth that was on file. So a new one has to be sent. If you have any questions you can call Amy . Her contact number is  [phone]  Reference key [id number] . Please advise. Thank you

## 2017-08-03 NOTE — Telephone Encounter (Signed)
Notified [employer] and they have closed the case.

## 2017-08-07 ENCOUNTER — Telehealth: Payer: Self-pay | Admitting: Pulmonary Disease

## 2017-08-07 DIAGNOSIS — G4733 Obstructive sleep apnea (adult) (pediatric): Secondary | ICD-10-CM | POA: Diagnosis not present

## 2017-08-07 NOTE — Procedures (Signed)
Patient Name: Luis [name], Sthilaire Date: 07/29/2017 Gender: Male D.O.B: Dec 11, 1965 Age (years): 74 Referring Provider: Cyril Mourning MD, ABSM Height (inches): 75 Interpreting Physician: Cyril Mourning MD, ABSM Weight (lbs): 354 RPSGT: Lowry Ram BMI: 44 MRN: [id number] Neck Size: 20.50   CLINICAL INFORMATION The patient is referred for a CPAP titration to treat sleep apnea.  Date of  HST:  05/2016 showed mild OSA with AHI of 14/hour with mild desaturations  SLEEP STUDY TECHNIQUE As per the AASM Manual for the Scoring of Sleep and Associated Events v2.3 (April 2016) with a hypopnea requiring 4% desaturations.  The channels recorded and monitored were frontal, central and occipital EEG, electrooculogram (EOG), submentalis EMG (chin), nasal and oral airflow, thoracic and abdominal wall motion, anterior tibialis EMG, snore microphone, electrocardiogram, and pulse oximetry. Continuous positive airway pressure (CPAP) was initiated at the beginning of the study and titrated to treat sleep-disordered breathing.  MEDICATIONS Medications self-administered by patient taken the night of the study : N/A  RESPIRATORY PARAMETERS Optimal PAP Pressure (cm): 14 AHI at Optimal Pressure (/hr): 0.0 Overall Minimal O2 (%): 87.00 Supine % at Optimal Pressure (%): 2 Minimal O2 at Optimal Pressure (%): 90.0     SLEEP ARCHITECTURE The study was initiated at 10:40:22 PM and ended at 4:47:06 AM.  Sleep onset time was 4.2 minutes and the sleep efficiency was 78.8%. The total sleep time was 289.0 minutes.  The patient spent 9.00% of the night in stage N1 sleep, 63.49% in stage N2 sleep, 0.00% in stage N3 and 27.51% in REM.Stage REM latency was 69.0 minutes  Wake after sleep onset was 73.6. Alpha intrusion was absent. Supine sleep was 5.36%.  CARDIAC DATA The 2 lead EKG demonstrated sinus rhythm. The mean heart rate was 88.09 beats per minute. Other EKG findings include: PVCs.   LEG MOVEMENT  DATA The total Periodic Limb Movements of Sleep (PLMS) were 35. The PLMS index was 7.27. A PLMS index of <15 is considered normal in adults.  IMPRESSIONS - The optimal PAP pressure was 14 cm of water. - Central sleep apnea was not noted during this titration (CAI = 0.0/h). - Severe oxygen desaturations were observed during this titration (min O2 = 87.00%). - The patient snored with soft snoring volume during this titration study. - 2-lead EKG demonstrated: PVCs - Mild periodic limb movements were observed during this study. Arousals associated with PLMs were rare.   DIAGNOSIS - Obstructive Sleep Apnea (327.23 [G47.33 ICD-10])   RECOMMENDATIONS - Trial of CPAP therapy on 14 cm H2O with a Large size Resmed Full Face Mask AirFit F20 mask and heated humidification. - Avoid alcohol, sedatives and other CNS depressants that may worsen sleep apnea and disrupt normal sleep architecture. - Sleep hygiene should be reviewed to assess factors that may improve sleep quality. - Weight management and regular exercise should be initiated or continued. - Return to Sleep Center for re-evaluation after 4 weeks of therapy   Cyril Mourning MD Board Certified in Sleep medicine

## 2017-08-07 NOTE — Telephone Encounter (Signed)
Rx to DME for CPAP 14 cm H2O with a Large size Resmed Full Face Mask AirFit F20 mask and heated humidification. DL in 4 wks  OV with TP in 6 wks

## 2017-08-10 NOTE — Telephone Encounter (Signed)
Spoke with patient. He wishes to hold off on CPAP for now. States he is not having any issues with sleeping at night. Advised patient that I will let RA know.   Nothing else needed at time of call.

## 2017-08-13 ENCOUNTER — Ambulatory Visit: Payer: BLUE CROSS/BLUE SHIELD | Admitting: Family Medicine

## 2017-08-17 DIAGNOSIS — R0600 Dyspnea, unspecified: Secondary | ICD-10-CM | POA: Diagnosis not present

## 2017-08-17 DIAGNOSIS — J9600 Acute respiratory failure, unspecified whether with hypoxia or hypercapnia: Secondary | ICD-10-CM | POA: Diagnosis not present

## 2017-08-19 ENCOUNTER — Ambulatory Visit: Payer: BLUE CROSS/BLUE SHIELD | Admitting: Podiatry

## 2017-08-20 ENCOUNTER — Encounter: Payer: Self-pay | Admitting: Podiatry

## 2017-08-20 ENCOUNTER — Other Ambulatory Visit: Payer: BLUE CROSS/BLUE SHIELD

## 2017-08-20 ENCOUNTER — Ambulatory Visit (INDEPENDENT_AMBULATORY_CARE_PROVIDER_SITE_OTHER): Payer: BLUE CROSS/BLUE SHIELD | Admitting: Podiatry

## 2017-08-20 ENCOUNTER — Encounter: Payer: Self-pay | Admitting: Family Medicine

## 2017-08-20 ENCOUNTER — Ambulatory Visit (INDEPENDENT_AMBULATORY_CARE_PROVIDER_SITE_OTHER): Payer: BLUE CROSS/BLUE SHIELD | Admitting: Family Medicine

## 2017-08-20 VITALS — BP 134/80 | HR 92 | Temp 98.1°F | Ht 75.0 in | Wt 353.8 lb

## 2017-08-20 DIAGNOSIS — R197 Diarrhea, unspecified: Secondary | ICD-10-CM

## 2017-08-20 DIAGNOSIS — M79609 Pain in unspecified limb: Secondary | ICD-10-CM

## 2017-08-20 DIAGNOSIS — E669 Obesity, unspecified: Secondary | ICD-10-CM

## 2017-08-20 DIAGNOSIS — E1169 Type 2 diabetes mellitus with other specified complication: Secondary | ICD-10-CM | POA: Diagnosis not present

## 2017-08-20 DIAGNOSIS — G4733 Obstructive sleep apnea (adult) (pediatric): Secondary | ICD-10-CM

## 2017-08-20 DIAGNOSIS — E119 Type 2 diabetes mellitus without complications: Secondary | ICD-10-CM

## 2017-08-20 DIAGNOSIS — B351 Tinea unguium: Secondary | ICD-10-CM | POA: Diagnosis not present

## 2017-08-20 LAB — COMPREHENSIVE METABOLIC PANEL
ALT: 21 U/L (ref 0–53)
AST: 18 U/L (ref 0–37)
Albumin: 3.9 g/dL (ref 3.5–5.2)
Alkaline Phosphatase: 137 U/L — ABNORMAL HIGH (ref 39–117)
BUN: 15 mg/dL (ref 6–23)
CO2: 26 mEq/L (ref 19–32)
Calcium: 8.7 mg/dL (ref 8.4–10.5)
Chloride: 105 mEq/L (ref 96–112)
Creatinine, Ser: 1.17 mg/dL (ref 0.40–1.50)
GFR: 85.03 mL/min (ref >60.00)
Glucose, Bld: 133 mg/dL — ABNORMAL HIGH (ref 70–99)
Potassium: 3.6 mEq/L (ref 3.5–5.1)
Sodium: 140 mEq/L (ref 135–145)
Total Bilirubin: 0.4 mg/dL (ref 0.2–1.2)
Total Protein: 7.3 g/dL (ref 6.0–8.3)

## 2017-08-20 LAB — CBC WITH DIFFERENTIAL/PLATELET
Basophils Absolute: 0 10*3/uL (ref 0.0–0.1)
Basophils Relative: 0.6 % (ref 0.0–3.0)
Eosinophils Absolute: 0.3 10*3/uL (ref 0.0–0.7)
Eosinophils Relative: 4.3 % (ref 0.0–5.0)
HCT: 44.2 % (ref 39.0–52.0)
Hemoglobin: 14.5 g/dL (ref 13.0–17.0)
Lymphocytes Relative: 30.2 % (ref 12.0–46.0)
Lymphs Abs: 2.2 10*3/uL (ref 0.7–4.0)
MCHC: 32.8 g/dL (ref 30.0–36.0)
MCV: 93.2 fl (ref 78.0–100.0)
Monocytes Absolute: 0.6 10*3/uL (ref 0.1–1.0)
Monocytes Relative: 8.4 % (ref 3.0–12.0)
Neutro Abs: 4.2 10*3/uL (ref 1.4–7.7)
Neutrophils Relative %: 56.5 % (ref 43.0–77.0)
Platelets: 283 10*3/uL (ref 150.0–400.0)
RBC: 4.74 Mil/uL (ref 4.22–5.81)
RDW: 14.4 % (ref 11.5–15.5)
WBC: 7.4 10*3/uL (ref 4.0–10.5)

## 2017-08-20 LAB — LIPASE: Lipase: 19 U/L (ref 11.0–59.0)

## 2017-08-20 LAB — GLUCOSE, POCT (MANUAL RESULT ENTRY): POC Glucose: 121 mg/dl — AB (ref 70–99)

## 2017-08-20 MED ORDER — ONDANSETRON HCL 4 MG PO TABS: 4.0000 mg | ORAL_TABLET | Freq: Three times a day (TID) | ORAL | 0 refills | Status: DC | PRN

## 2017-08-20 MED ORDER — LOPERAMIDE HCL 2 MG PO CAPS: 2.0000 mg | ORAL_CAPSULE | Freq: Four times a day (QID) | ORAL | 0 refills | Status: DC | PRN

## 2017-08-20 NOTE — Progress Notes (Signed)
  Subjective:  Patient ID: Luis [name], male    DOB: 06-10-1966,  MRN: [id number]  51 y.o. male returns for painfully thickened elongated nails both feet. Dorsal diabetes with morning A1c of 121. Denies numbness and tingling in feet. Denies burning and cramping in legs and thighs. Last saw his PCP Dr. Earlene Plater this morning.  Objective:  There were no vitals filed for this visit. General AA&O x3. Normal mood and affect.  Vascular Dorsalis pedis and posterior tibial pulses  present 2+ bilaterally  Capillary refill normal to all digits. Pedal hair growth absent.  Neurologic Epicritic sensation present bilaterally. Protective sensation with 5.07 monofilament  present bilaterally. Vibratory sensation present bilaterally.  Dermatologic No open lesions. Interspaces clear of maceration.  Normal skin temperature and turgor. Hyperkeratotic lesions: none bilaterally. Nails: brittle, discoloration dark brown, onychomycosis, thickening. Pain  Orthopedic: No history of amputation. MMT 5/5 in dorsiflexion, plantarflexion, inversion, and eversion. Normal lower extremity joint ROM without pain or crepitus.   Assessment & Plan:  Patient was evaluated and treated and all questions answered.  Diabetes without complication, pain due to onychomycosis -Nails debrided as below -Advised he does not meet criteria for routine foot care. Discussed it is okay for him to get a pedicure  Procedure: Nail Debridement Rationale: pain in toenails Type of Debridement: manual, sharp debridement. Instrumentation: Nail nipper, rotary burr. Number of Nails: 10  Return in about 6 months (around 02/18/2018).

## 2017-08-20 NOTE — Progress Notes (Signed)
Luis [name] is a 51 y.o. male is here for follow up.  History of Present Illness:   Water quality scientist, CMA, acting as scribe for Dr. Juleen China.  HPI:  Patient comes in today for follow up.  He started Trulicity 5 weeks ago.  He began having diarrhea 1 week ago.  He thinks the Trulicity is the cause for this.  No blood in his stools.  No sick contacts.  No antibiotics.  States the diarrhea will stop and turn into gas until he eats and then the diarrhea will start again.  No nausea or fevers.  No abdominal pain.  He still has his gallbladder.  He is not checking his blood sugars.  He is no longer taking insulin.  States he has a 46-FKC supply of Trulicity in his refrigerator at home.  He enjoys not having to take his insulin anymore.  He did not have diarrhea before starting Trulicity.    Patient had a sleep study performed and was diagnosed with sleep apnea.  However, when speaking with his pulmonologist, he declined a CPAP machine.  He states he "sleeps just fine".  He wants to concentrate on the diarrhea he is currently having.  He has been given a copy of the study to read so that he can see the long term effects of untreated sleep apnea.  The swelling in his lower extremities has gone down somewhat.  He is walking better.  He states he still has some of the swelling.  He continues to take the torsemide.  He states he has not been elevating his feet at home.  Health Maintenance Due  Topic Date Due  . PNEUMOCOCCAL POLYSACCHARIDE VACCINE (1) 02/12/1970  . TETANUS/TDAP  02/13/1987  . OPHTHALMOLOGY EXAM  03/17/2017   Depression screen PHQ 2/9 06/11/2017  Decreased Interest 0  Down, Depressed, Hopeless 0  PHQ - 2 Score 0   PMHx, SurgHx, SocialHx, FamHx, Medications, and Allergies were reviewed in the Visit Navigator and updated as appropriate.   Patient Active Problem List   Diagnosis Date Noted  . Diarrhea 08/20/2017  . Onychomycosis 06/14/2017  . Bilateral edema of lower extremity 06/14/2017   . Hyperlipidemia   . Difficulty walking 06/11/2017  . OSA (obstructive sleep apnea) 03/31/2017  . Debility 03/17/2017  . NICM (nonischemic cardiomyopathy) ([place])   . Morbid obesity ([place])   . Former smoker   . Diabetes mellitus type 2 in obese ([place])   . Diastolic congestive heart failure ([place])   . Essential hypertension 03/19/2016  . Paroxysmal atrial fibrillation ([place]) 03/19/2016   Social History  Substance Use Topics  . Smoking status: Former Smoker    Packs/day: 0.50    Years: 20.00    Types: Cigarettes    Quit date: 03/02/2009  . Smokeless tobacco: Never Used  . Alcohol use No   Current Medications and Allergies:   Current Outpatient Prescriptions:  .  albuterol (PROVENTIL HFA;VENTOLIN HFA) 108 (90 Base) MCG/ACT inhaler, Inhale 2 puffs into the lungs every 4 (four) hours as needed for wheezing., Disp: 1 Inhaler, Rfl: 0 .  aspirin EC 81 MG EC tablet, Take 1 tablet (81 mg total) by mouth daily., Disp: 30 tablet, Rfl: 0 .  blood glucose meter kit and supplies KIT, Dispense based on patient and insurance preference. Use up to four times daily as directed. (FOR ICD-9 250.00, 250.01)., Disp: 1 each, Rfl: 0 .  Dulaglutide (TRULICITY) 1.5 LE/7.5TZ SOPN, Inject 1 pen into the skin once a week., Disp: 12  pen, Rfl: 3 .  Lancets (ACCU-CHEK SAFE-T PRO) lancets, Use as instructed, Disp: 100 each, Rfl: 12 .  Metoprolol Tartrate 37.5 MG TABS, Take 37.5 mg by mouth 2 (two) times daily., Disp: 180 tablet, Rfl: 0 .  Probiotic Product (PROBIOTIC-10 PO), Take by mouth., Disp: , Rfl:  .  torsemide (DEMADEX) 10 MG tablet, Take 1 - 2 tablets daily for swelling, Disp: 180 tablet, Rfl: 0 .  loperamide (IMODIUM) 2 MG capsule, Take 1 capsule (2 mg total) by mouth 4 (four) times daily as needed for diarrhea or loose stools., Disp: 30 capsule, Rfl: 0 .  ondansetron (ZOFRAN) 4 MG tablet, Take 1 tablet (4 mg total) by mouth every 8 (eight) hours as needed for nausea or vomiting., Disp: 20 tablet, Rfl:  0  Allergies  Allergen Reactions  . Demerol [Meperidine] Itching  . Morphine And Related Itching   Review of Systems   Pertinent items are noted in the HPI. Otherwise, ROS is negative.  Vitals:   Vitals:   08/20/17 0839  BP: 134/80  Pulse: 92  Temp: 98.1 F (36.7 C)  TempSrc: Oral  SpO2: 95%  Weight: (!) 353 lb 12.8 oz (160.5 kg)  Height: 6' 3" (1.905 m)     Body mass index is 44.22 kg/m.   Physical Exam:   Physical Exam  Constitutional: He is oriented to person, place, and time. He appears well-developed and well-nourished. No distress.  HENT:  Head: Normocephalic and atraumatic.  Right Ear: External ear normal.  Left Ear: External ear normal.  Nose: Nose normal.  Mouth/Throat: Oropharynx is clear and moist.  Eyes: Pupils are equal, round, and reactive to light. Conjunctivae and EOM are normal.  Neck: Normal range of motion. Neck supple.  Cardiovascular: Normal rate, regular rhythm, normal heart sounds and intact distal pulses.   Pulmonary/Chest: Effort normal and breath sounds normal.  Abdominal: Soft. Bowel sounds are normal.  Musculoskeletal: Normal range of motion.  Neurological: He is alert and oriented to person, place, and time.  Skin: Skin is warm and dry.  Psychiatric: He has a normal mood and affect. His behavior is normal. Judgment and thought content normal.  Nursing note and vitals reviewed.   Assessment and Plan:   Luis [name] was seen today for follow-up.  Diagnoses and all orders for this visit:  Diarrhea, unspecified type Comments: Work-up today. Random BG today at 126. If labs WNL, will change to different treatment.  Orders: -     loperamide (IMODIUM) 2 MG capsule; Take 1 capsule (2 mg total) by mouth 4 (four) times daily as needed for diarrhea or loose stools. -     Comprehensive metabolic panel -     CBC with Differential/Platelet -     Lipase -     ondansetron (ZOFRAN) 4 MG tablet; Take 1 tablet (4 mg total) by mouth every 8 (eight)  hours as needed for nausea or vomiting. -     Clostridium Difficile by PCR; Future -     Ova and parasite examination; Future -     Stool culture; Future  Diabetes mellitus type 2 in obese [facility]) Comments: The pateint did not want to address other issues today, but we have a running "to do" list:  - ACE/ARB - STATIN  Orders: -     POCT glucose (manual entry) = 126  OSA (obstructive sleep apnea) Comments: Patient declines CPAP.   Marland Kitchen Reviewed expectations re: course of current medical issues. . Discussed self-management of symptoms. . Outlined signs  and symptoms indicating need for more acute intervention. . Patient verbalized understanding and all questions were answered. Marland Kitchen Health Maintenance issues including appropriate healthy diet, exercise, and smoking avoidance were discussed with patient. . See orders for this visit as documented in the electronic medical record. . Patient received an After Visit Summary.  CMA served as Education administrator during this visit. History, Physical, and Plan performed by medical provider. The above documentation has been reviewed and is accurate and complete. Briscoe Deutscher, D.O.  Briscoe Deutscher, DO [place], [facility] [place] [facility] 08/20/2017  Future Appointments Date Time Provider [place]  11/19/2017 9:00 AM Briscoe Deutscher, DO [facility] None

## 2017-08-21 LAB — OVA AND PARASITE EXAMINATION
CONCENTRATE RESULT:: NONE SEEN
MICRO NUMBER:: 81165757
SPECIMEN QUALITY:: ADEQUATE
TRICHROME RESULT:: NONE SEEN

## 2017-08-21 LAB — CLOSTRIDIUM DIFFICILE BY PCR: Toxigenic C. Difficile by PCR: NOT DETECTED

## 2017-08-24 LAB — STOOL CULTURE
MICRO NUMBER:: 81165750
MICRO NUMBER:: 81165751
MICRO NUMBER:: 81165752
SHIGA RESULT:: NOT DETECTED
SPECIMEN QUALITY:: ADEQUATE
SPECIMEN QUALITY:: ADEQUATE
SPECIMEN QUALITY:: ADEQUATE

## 2017-09-01 ENCOUNTER — Telehealth: Payer: Self-pay | Admitting: Family Medicine

## 2017-09-01 NOTE — Telephone Encounter (Signed)
Patient calling b/c he mentioned he was having diarrhea on 10/18 visit, and is still having it.  He took the over the counter med Earlene Plater suggested but it is still persistant. It is interfering with his work.  Please advise,  Ty,   -LL

## 2017-09-02 NOTE — Telephone Encounter (Signed)
Please advise 

## 2017-09-06 NOTE — Telephone Encounter (Signed)
While my ultimate goal will be to get off of insulin, I want to have him go back to the Lantus until the diarrhea has resolved. I want to be SURE that it is the Trulicity and not anything else. Also, I want the diarrhea better before we trial a new medication.   Trulicity will be in his system for 1-2 weeks. Restart at 5 units daily. See titration instructions below.   I would like you to monitor your fasting blood sugars on a daily basis. Increase your Lantus Insulin by 1 units every 1-2 days until your fasting blood sugars are consistently below 120. If you are having problems with hypoglycemia, go back to the next lower dose and stay there.    GOAL BLOOD SUGARS:    AM fasting: 80-120   Pre-meal later in day: 80-130   Bedtime: 100-140   2 hours after meals: < 180

## 2017-09-07 NOTE — Telephone Encounter (Signed)
Sending this back to you per our discussion.

## 2017-09-07 NOTE — Telephone Encounter (Signed)
LM for patient to return call.

## 2017-09-07 NOTE — Telephone Encounter (Signed)
Spoke with Ozempic rep. Could try transitioning to this at low dose as another option. We have sample pack - similar to Trulicity but "better."

## 2017-09-09 NOTE — Telephone Encounter (Signed)
LM for patient to return call.

## 2017-09-10 NOTE — Telephone Encounter (Signed)
Spoke with patient.  He states his diarrhea is improving somewhat.  He says he does not have solid bowel movements but frequency has slowed down.  The Immodium did not help.  He would like to know if there is a stronger medication to help?   He does not want to have to go back to sticking himself daily.  Would like to stay on Trulicity if at all possible.  He is interested in trying Ozempic in the future but states he has about a month and a half supply of Trulicity in his fridge that he has already paid for so he would like to use that first before changing.  He wonders if it is just taking his body a longer time to adjust to the Trulicity?  Please advise.

## 2017-09-10 NOTE — Telephone Encounter (Signed)
Noted.  Advised patient to contact our office if he feels diarrhea is not continuing to improve or he decides he would like to make transition to Ozempic.

## 2017-09-10 NOTE — Telephone Encounter (Signed)
Yes, it could be taking longer. If he is not getting dehydrated, okay to continue.

## 2017-09-17 DIAGNOSIS — R0600 Dyspnea, unspecified: Secondary | ICD-10-CM | POA: Diagnosis not present

## 2017-09-17 DIAGNOSIS — J9600 Acute respiratory failure, unspecified whether with hypoxia or hypercapnia: Secondary | ICD-10-CM | POA: Diagnosis not present

## 2017-10-08 ENCOUNTER — Other Ambulatory Visit: Payer: Self-pay

## 2017-10-08 DIAGNOSIS — I509 Heart failure, unspecified: Secondary | ICD-10-CM

## 2017-10-08 MED ORDER — TORSEMIDE 10 MG PO TABS: ORAL_TABLET | ORAL | 1 refills | Status: DC

## 2017-10-17 DIAGNOSIS — R0600 Dyspnea, unspecified: Secondary | ICD-10-CM | POA: Diagnosis not present

## 2017-10-17 DIAGNOSIS — J9600 Acute respiratory failure, unspecified whether with hypoxia or hypercapnia: Secondary | ICD-10-CM | POA: Diagnosis not present

## 2017-11-13 ENCOUNTER — Ambulatory Visit: Payer: Self-pay

## 2017-11-13 NOTE — Telephone Encounter (Signed)
   Answer Assessment - Initial Assessment Questions 1. REASON FOR CALL or QUESTION: "What is your reason for calling today?" or "How can I best help you?" or "What question do you have that I can help answer?"     Pt wanting to know where pt can have his trulicity shots. Pt informed upper arm, abdomen and thigh  Protocols used: INFORMATION ONLY CALL-A-AH

## 2017-11-17 DIAGNOSIS — R0600 Dyspnea, unspecified: Secondary | ICD-10-CM | POA: Diagnosis not present

## 2017-11-17 DIAGNOSIS — J9600 Acute respiratory failure, unspecified whether with hypoxia or hypercapnia: Secondary | ICD-10-CM | POA: Diagnosis not present

## 2017-11-19 ENCOUNTER — Encounter: Payer: Self-pay | Admitting: Family Medicine

## 2017-11-19 ENCOUNTER — Ambulatory Visit (INDEPENDENT_AMBULATORY_CARE_PROVIDER_SITE_OTHER): Payer: BLUE CROSS/BLUE SHIELD | Admitting: Family Medicine

## 2017-11-19 ENCOUNTER — Ambulatory Visit (INDEPENDENT_AMBULATORY_CARE_PROVIDER_SITE_OTHER): Payer: BLUE CROSS/BLUE SHIELD

## 2017-11-19 VITALS — BP 184/106 | HR 72 | Temp 98.2°F | Ht 75.0 in | Wt 370.8 lb

## 2017-11-19 DIAGNOSIS — R0602 Shortness of breath: Secondary | ICD-10-CM | POA: Diagnosis not present

## 2017-11-19 DIAGNOSIS — R600 Localized edema: Secondary | ICD-10-CM | POA: Diagnosis not present

## 2017-11-19 DIAGNOSIS — E1169 Type 2 diabetes mellitus with other specified complication: Secondary | ICD-10-CM

## 2017-11-19 DIAGNOSIS — E669 Obesity, unspecified: Secondary | ICD-10-CM

## 2017-11-19 DIAGNOSIS — I10 Essential (primary) hypertension: Secondary | ICD-10-CM

## 2017-11-19 DIAGNOSIS — E782 Mixed hyperlipidemia: Secondary | ICD-10-CM | POA: Diagnosis not present

## 2017-11-19 DIAGNOSIS — G4733 Obstructive sleep apnea (adult) (pediatric): Secondary | ICD-10-CM | POA: Diagnosis not present

## 2017-11-19 LAB — COMPREHENSIVE METABOLIC PANEL
ALT: 14 U/L (ref 0–53)
AST: 12 U/L (ref 0–37)
Albumin: 4.1 g/dL (ref 3.5–5.2)
Alkaline Phosphatase: 131 U/L — ABNORMAL HIGH (ref 39–117)
BUN: 10 mg/dL (ref 6–23)
CO2: 25 mEq/L (ref 19–32)
Calcium: 8.8 mg/dL (ref 8.4–10.5)
Chloride: 105 mEq/L (ref 96–112)
Creatinine, Ser: 1 mg/dL (ref 0.40–1.50)
GFR: 101.82 mL/min (ref >60.00)
Glucose, Bld: 165 mg/dL — ABNORMAL HIGH (ref 70–99)
Potassium: 4 mEq/L (ref 3.5–5.1)
Sodium: 139 mEq/L (ref 135–145)
Total Bilirubin: 0.7 mg/dL (ref 0.2–1.2)
Total Protein: 7.1 g/dL (ref 6.0–8.3)

## 2017-11-19 LAB — CBC WITH DIFFERENTIAL/PLATELET
Basophils Absolute: 0.1 10*3/uL (ref 0.0–0.1)
Basophils Relative: 1.2 % (ref 0.0–3.0)
Eosinophils Absolute: 0.2 10*3/uL (ref 0.0–0.7)
Eosinophils Relative: 2.6 % (ref 0.0–5.0)
HCT: 45.9 % (ref 39.0–52.0)
Hemoglobin: 14.7 g/dL (ref 13.0–17.0)
Lymphocytes Relative: 34.3 % (ref 12.0–46.0)
Lymphs Abs: 2.8 10*3/uL (ref 0.7–4.0)
MCHC: 32 g/dL (ref 30.0–36.0)
MCV: 95.8 fl (ref 78.0–100.0)
Monocytes Absolute: 0.7 10*3/uL (ref 0.1–1.0)
Monocytes Relative: 8 % (ref 3.0–12.0)
Neutro Abs: 4.4 10*3/uL (ref 1.4–7.7)
Neutrophils Relative %: 53.9 % (ref 43.0–77.0)
Platelets: 277 10*3/uL (ref 150.0–400.0)
RBC: 4.79 Mil/uL (ref 4.22–5.81)
RDW: 15.5 % (ref 11.5–15.5)
WBC: 8.2 10*3/uL (ref 4.0–10.5)

## 2017-11-19 LAB — POCT GLYCOSYLATED HEMOGLOBIN (HGB A1C): Hemoglobin A1C: 7

## 2017-11-19 LAB — TSH: TSH: 1.99 u[IU]/mL (ref 0.35–4.50)

## 2017-11-19 LAB — BRAIN NATRIURETIC PEPTIDE: Pro B Natriuretic peptide (BNP): 75 pg/mL (ref 0.0–100.0)

## 2017-11-19 MED ORDER — TRAZODONE HCL 50 MG PO TABS
25.0000 mg | ORAL_TABLET | Freq: Every evening | ORAL | 3 refills | Status: DC | PRN
Start: 2017-11-19 — End: 2018-01-08

## 2017-11-19 MED ORDER — SIMVASTATIN 20 MG PO TABS: 20.0000 mg | ORAL_TABLET | Freq: Every day | ORAL | 3 refills | Status: DC

## 2017-11-19 MED ORDER — LOSARTAN POTASSIUM 25 MG PO TABS: 25.0000 mg | ORAL_TABLET | Freq: Every day | ORAL | 2 refills | Status: DC

## 2017-11-19 NOTE — Progress Notes (Signed)
Luis [name] is a 52 y.o. male is here for follow up.  History of Present Illness:   [name] CMA acting as scribe for Dr. Juleen China.  HPI: See Assessment and Plan section for Problem Based Charting of issues discussed today.  Patient comes in today for his follow up.   Hypertension: Patient stated that he has been taking his blood pressure medication as prescribed. He did not take it this morning before his appointment. He stated that he is not taking his torsemide due to already urinating too much. He is still having some edema.   Diabetes: Patient has been taking his medications as prescribed. No concerns today. A1c today was 7.0.   Health Maintenance Due  Topic Date Due  . PNEUMOCOCCAL POLYSACCHARIDE VACCINE (1) 02/12/1970  . TETANUS/TDAP  02/13/1987  . OPHTHALMOLOGY EXAM  03/17/2017   Depression screen PHQ 2/9 06/11/2017  Decreased Interest 0  Down, Depressed, Hopeless 0  PHQ - 2 Score 0   PMHx, SurgHx, SocialHx, FamHx, Medications, and Allergies were reviewed in the Visit Navigator and updated as appropriate.   Patient Active Problem List   Diagnosis Date Noted  . Diarrhea 08/20/2017  . Onychomycosis 06/14/2017  . Bilateral edema of lower extremity 06/14/2017  . Hyperlipidemia   . Difficulty walking 06/11/2017  . OSA (obstructive sleep apnea) 03/31/2017  . Debility 03/17/2017  . NICM (nonischemic cardiomyopathy) ([place])   . Morbid obesity ([place])   . Former smoker   . Diabetes mellitus type 2 in obese ([place])   . Diastolic congestive heart failure ([place])   . Essential hypertension 03/19/2016  . Paroxysmal atrial fibrillation ([place]) 03/19/2016   Social History   Tobacco Use  . Smoking status: Former Smoker    Packs/day: 0.50    Years: 20.00    Pack years: 10.00    Types: Cigarettes    Last attempt to quit: 03/02/2009    Years since quitting: 8.7  . Smokeless tobacco: Never Used  Substance Use Topics  . Alcohol use: No  . Drug use: No   Current Medications  and Allergies:   .  albuterol (PROVENTIL HFA;VENTOLIN HFA) 108 (90 Base) MCG/ACT inhaler, Inhale 2 puffs into the lungs every 4 (four) hours as needed for wheezing., Disp: 1 Inhaler, Rfl: 0 .  aspirin EC 81 MG EC tablet, Take 1 tablet (81 mg total) by mouth daily., Disp: 30 tablet, Rfl: 0 .  blood glucose meter kit and supplies KIT, Dispense based on patient and insurance preference. Use up to four times daily as directed. (FOR ICD-9 250.00, 250.01)., Disp: 1 each, Rfl: 0 .  Dulaglutide (TRULICITY) 1.5 EO/7.1QR SOPN, Inject 1 pen into the skin once a week., Disp: 12 pen, Rfl: 3 .  Lancets (ACCU-CHEK SAFE-T PRO) lancets, Use as instructed, Disp: 100 each, Rfl: 12 .  loperamide (IMODIUM) 2 MG capsule, Take 1 capsule (2 mg total) by mouth 4 (four) times daily as needed for diarrhea or loose stools., Disp: 30 capsule, Rfl: 0 .  Metoprolol Tartrate 37.5 MG TABS, Take 37.5 mg by mouth 2 (two) times daily., Disp: 180 tablet, Rfl: 0 .  Probiotic Product (PROBIOTIC-10 PO), Take by mouth., Disp: , Rfl:  .  torsemide (DEMADEX) 10 MG tablet, Take 1 - 2 tablets daily for swelling (Patient not taking: Reported on 11/19/2017), Disp: 180 tablet, Rfl: 1   Allergies  Allergen Reactions  . Demerol [Meperidine] Itching  . Morphine And Related Itching   Review of Systems   Pertinent items are noted  in the HPI. Otherwise, ROS is negative.  Vitals:   Vitals:   11/19/17 0857  BP: (!) 184/106  Pulse: 72  Temp: 98.2 F (36.8 C)  TempSrc: Oral  SpO2: 95%  Weight: (!) 370 lb 12.8 oz (168.2 kg)  Height: _0  (1.905 m)     Body mass index is 46.35 kg/m.  Physical Exam:   Physical Exam  Constitutional: He is oriented to person, place, and time. He appears well-developed and well-nourished. No distress.  HENT:  Head: Normocephalic and atraumatic.  Right Ear: External ear normal.  Left Ear: External ear normal.  Nose: Nose normal.  Mouth/Throat: Oropharynx is clear and moist.  Eyes: Conjunctivae and  EOM are normal. Pupils are equal, round, and reactive to light.  Neck: Normal range of motion. Neck supple.  Cardiovascular: Normal rate, regular rhythm and intact distal pulses.  Pulmonary/Chest: Effort normal. No respiratory distress. He has decreased breath sounds.  Abdominal: Soft. Bowel sounds are normal.  Musculoskeletal: He exhibits edema.  Neurological: He is alert and oriented to person, place, and time.  Skin: Skin is warm and dry.  Psychiatric: He has a normal mood and affect. His behavior is normal. Judgment and thought content normal.  Nursing note and vitals reviewed.   Results for orders placed or performed in visit on 11/19/17  POCT glycosylated hemoglobin (Hb A1C)  Result Value Ref Range   Hemoglobin A1C 7.0    Assessment and Plan:   1. Diabetes mellitus type 2 in obese [facility]) Current symptoms: no polyuria or polydipsia, no chest pain, dyspnea or TIA's, weight has increased, has dysesthesias in the feet.  Taking medication compliantly without noted sided effects _1   YES  _2   NO  Episodes of hypoglycemia? _3   YES  _4   NO Maintaining a diabetic diet? _5   YES  _6   NO Trying to exercise on a regular basis? _7   YES  _8   NO  Lab Results  Component Value Date   HGBA1C 7.0 11/19/2017    Lab Results  Component Value Date   MICROALBUR <0.7 07/16/2017      Wt Readings from Last 3 Encounters:  11/19/17 (!) 370 lb 12.8 oz (168.2 kg)  08/20/17 (!) 353 lb 12.8 oz (160.5 kg)  07/29/17 (!) 354 lb 9.6 oz (160.8 kg)   BP Readings from Last 3 Encounters:  11/19/17 (!) 184/106  08/20/17 134/80  07/16/17 138/84   Lab Results  Component Value Date   CREATININE 1.17 08/20/2017   - POCT glycosylated hemoglobin (Hb A1C) - CBC with Differential/Platelet - Comprehensive metabolic panel - losartan (COZAAR) 25 MG tablet; Take 1 tablet (25 mg total) by mouth daily.  Dispense: 30 tablet; Refill: 2  2. Shortness of breath - Brain natriuretic peptide - TSH - DG Chest 2 View;  Future  3. Morbid obesity ([place]) The patient is asked to make an attempt to improve diet and exercise patterns to aid in medical management of this problem.   - TSH  4. OSA (obstructive sleep apnea) Patient finally agreed to trialing a CPAP.  Those orders were completed today.  I am hopeful that this will help with his chronic medical conditions.  He is very worried about being able to sleep while using the CPAP.  Provided a prescription of trazodone as below.  Risks and benefits were reviewed with the patient.  - traZODone (DESYREL) 50 MG tablet; Take 0.5-1 tablets (25-50 mg total) by mouth at bedtime as needed for sleep.  Dispense: 30 tablet; Refill:  3  5. Essential hypertension Avoiding excessive salt intake? _0   YES  _1   NO Trying to exercise on a regular basis? _2   YES  _3   NO Review: not taking medications regularly as instructed, no TIAs, no chest pain on exertion, noting swelling of ankles.  Smoker: No.  BP Readings from Last 3 Encounters:  11/19/17 (!) 184/106  08/20/17 134/80  07/16/17 138/84   Lab Results  Component Value Date   CREATININE 1.17 08/20/2017   - CBC with Differential/Platelet - Comprehensive metabolic panel - losartan (COZAAR) 25 MG tablet; Take 1 tablet (25 mg total) by mouth daily.  Dispense: 30 tablet; Refill: 2  6. Bilateral edema of lower extremity Patient stopped taking torsemide 1-2 weeks ago.  He states that he urinates through the night and this is keeping him from being able to sleep.  We discussed the importance of diuresis in the setting of his medical issues, including congestive heart failure.  He is willing to go ahead and restart the torsemide.  Labs are pending below.  Once we get to a dry weight, I am hopeful that he will not be urinating throughout the night so much.  - CBC with Differential/Platelet - Brain natriuretic peptide - TSH  7. Mixed hyperlipidemia Diet Compliance: noncompliant much of the time. Cardiovascular ROS: no  chest pain or dyspnea on exertion.   Lipids: Lab Results  Component Value Date   CHOL 159 06/11/2017   HDL 33.30 (L) 06/11/2017   LDLCALC 112 (H) 06/11/2017   TRIG 67.0 06/11/2017   CHOLHDL 5 06/11/2017   - simvastatin (ZOCOR) 20 MG tablet; Take 1 tablet (20 mg total) by mouth at bedtime.  Dispense: 90 tablet; Refill: 3   . Reviewed expectations re: course of current medical issues. . Discussed self-management of symptoms. . Outlined signs and symptoms indicating need for more acute intervention. . Patient verbalized understanding and all questions were answered. Marland Kitchen Health Maintenance issues including appropriate healthy diet, exercise, and smoking avoidance were discussed with patient. . See orders for this visit as documented in the electronic medical record. . Patient received an After Visit Summary.  CMA served as Education administrator during this visit. History, Physical, and Plan performed by medical provider. The above documentation has been reviewed and is accurate and complete. Briscoe Deutscher, D.O.    Briscoe Deutscher, DO [place], [facility] 11/19/2017

## 2017-12-18 DIAGNOSIS — J9600 Acute respiratory failure, unspecified whether with hypoxia or hypercapnia: Secondary | ICD-10-CM | POA: Diagnosis not present

## 2017-12-18 DIAGNOSIS — R0600 Dyspnea, unspecified: Secondary | ICD-10-CM | POA: Diagnosis not present

## 2017-12-20 ENCOUNTER — Other Ambulatory Visit: Payer: Self-pay

## 2017-12-20 ENCOUNTER — Encounter (HOSPITAL_COMMUNITY): Payer: Self-pay

## 2017-12-20 ENCOUNTER — Emergency Department (HOSPITAL_COMMUNITY): Payer: BLUE CROSS/BLUE SHIELD

## 2017-12-20 ENCOUNTER — Inpatient Hospital Stay (HOSPITAL_COMMUNITY)
Admission: EM | Admit: 2017-12-20 | Discharge: 2017-12-26 | DRG: 871 | Disposition: A | Discharge status: Home / Self Care | Payer: BLUE CROSS/BLUE SHIELD | Attending: Internal Medicine | Admitting: Internal Medicine

## 2017-12-20 DIAGNOSIS — Z8701 Personal history of pneumonia (recurrent): Secondary | ICD-10-CM

## 2017-12-20 DIAGNOSIS — K760 Fatty (change of) liver, not elsewhere classified: Secondary | ICD-10-CM | POA: Diagnosis not present

## 2017-12-20 DIAGNOSIS — N182 Chronic kidney disease, stage 2 (mild): Secondary | ICD-10-CM | POA: Diagnosis present

## 2017-12-20 DIAGNOSIS — R404 Transient alteration of awareness: Secondary | ICD-10-CM | POA: Diagnosis not present

## 2017-12-20 DIAGNOSIS — Z6841 Body Mass Index (BMI) 40.0 and over, adult: Secondary | ICD-10-CM

## 2017-12-20 DIAGNOSIS — R42 Dizziness and giddiness: Secondary | ICD-10-CM | POA: Diagnosis not present

## 2017-12-20 DIAGNOSIS — R197 Diarrhea, unspecified: Secondary | ICD-10-CM | POA: Diagnosis present

## 2017-12-20 DIAGNOSIS — E870 Hyperosmolality and hypernatremia: Secondary | ICD-10-CM | POA: Diagnosis not present

## 2017-12-20 DIAGNOSIS — R509 Fever, unspecified: Secondary | ICD-10-CM

## 2017-12-20 DIAGNOSIS — L0231 Cutaneous abscess of buttock: Secondary | ICD-10-CM | POA: Diagnosis present

## 2017-12-20 DIAGNOSIS — Z7982 Long term (current) use of aspirin: Secondary | ICD-10-CM | POA: Diagnosis not present

## 2017-12-20 DIAGNOSIS — E1122 Type 2 diabetes mellitus with diabetic chronic kidney disease: Secondary | ICD-10-CM | POA: Diagnosis present

## 2017-12-20 DIAGNOSIS — I10 Essential (primary) hypertension: Secondary | ICD-10-CM | POA: Diagnosis not present

## 2017-12-20 DIAGNOSIS — L8990 Pressure ulcer of unspecified site, unspecified stage: Secondary | ICD-10-CM

## 2017-12-20 DIAGNOSIS — E1159 Type 2 diabetes mellitus with other circulatory complications: Secondary | ICD-10-CM | POA: Diagnosis present

## 2017-12-20 DIAGNOSIS — I130 Hypertensive heart and chronic kidney disease with heart failure and stage 1 through stage 4 chronic kidney disease, or unspecified chronic kidney disease: Secondary | ICD-10-CM | POA: Diagnosis not present

## 2017-12-20 DIAGNOSIS — Z8249 Family history of ischemic heart disease and other diseases of the circulatory system: Secondary | ICD-10-CM

## 2017-12-20 DIAGNOSIS — I5032 Chronic diastolic (congestive) heart failure: Secondary | ICD-10-CM | POA: Diagnosis present

## 2017-12-20 DIAGNOSIS — E860 Dehydration: Secondary | ICD-10-CM | POA: Diagnosis present

## 2017-12-20 DIAGNOSIS — R0602 Shortness of breath: Secondary | ICD-10-CM | POA: Diagnosis not present

## 2017-12-20 DIAGNOSIS — A419 Sepsis, unspecified organism: Secondary | ICD-10-CM | POA: Diagnosis not present

## 2017-12-20 DIAGNOSIS — E669 Obesity, unspecified: Secondary | ICD-10-CM | POA: Diagnosis not present

## 2017-12-20 DIAGNOSIS — L0390 Cellulitis, unspecified: Secondary | ICD-10-CM | POA: Diagnosis not present

## 2017-12-20 DIAGNOSIS — N179 Acute kidney failure, unspecified: Secondary | ICD-10-CM | POA: Diagnosis not present

## 2017-12-20 DIAGNOSIS — L03317 Cellulitis of buttock: Secondary | ICD-10-CM | POA: Diagnosis not present

## 2017-12-20 DIAGNOSIS — Z87891 Personal history of nicotine dependence: Secondary | ICD-10-CM

## 2017-12-20 DIAGNOSIS — E1110 Type 2 diabetes mellitus with ketoacidosis without coma: Secondary | ICD-10-CM

## 2017-12-20 DIAGNOSIS — E785 Hyperlipidemia, unspecified: Secondary | ICD-10-CM | POA: Diagnosis not present

## 2017-12-20 DIAGNOSIS — G4733 Obstructive sleep apnea (adult) (pediatric): Secondary | ICD-10-CM | POA: Diagnosis present

## 2017-12-20 DIAGNOSIS — E1169 Type 2 diabetes mellitus with other specified complication: Secondary | ICD-10-CM | POA: Diagnosis not present

## 2017-12-20 DIAGNOSIS — I152 Hypertension secondary to endocrine disorders: Secondary | ICD-10-CM | POA: Diagnosis present

## 2017-12-20 DIAGNOSIS — R531 Weakness: Secondary | ICD-10-CM | POA: Diagnosis not present

## 2017-12-20 DIAGNOSIS — R5383 Other fatigue: Secondary | ICD-10-CM | POA: Diagnosis not present

## 2017-12-20 LAB — URINALYSIS, ROUTINE W REFLEX MICROSCOPIC
Bacteria, UA: NONE SEEN
Bilirubin Urine: NEGATIVE
Glucose, UA: >=500 mg/dL — AB
Ketones, ur: 80 mg/dL — AB
Leukocytes, UA: NEGATIVE
NITRITE: NEGATIVE
Protein, ur: NEGATIVE mg/dL
SPECIFIC GRAVITY, URINE: 1.028 (ref 1.005–1.030)
pH: 5 (ref 5.0–8.0)

## 2017-12-20 LAB — CBC
HEMATOCRIT: 50.1 % (ref 39.0–52.0)
Hemoglobin: 16.6 g/dL (ref 13.0–17.0)
MCH: 32.2 pg (ref 26.0–34.0)
MCHC: 33.1 g/dL (ref 30.0–36.0)
MCV: 97.3 fL (ref 78.0–100.0)
Platelets: 287 10*3/uL (ref 150–400)
RBC: 5.15 MIL/uL (ref 4.22–5.81)
RDW: 14.3 % (ref 11.5–15.5)
WBC: 12 10*3/uL — ABNORMAL HIGH (ref 4.0–10.5)

## 2017-12-20 LAB — GLUCOSE, CAPILLARY
GLUCOSE-CAPILLARY: 446 mg/dL — AB (ref 65–99)
GLUCOSE-CAPILLARY: 368 mg/dL — AB (ref 65–99)
GLUCOSE-CAPILLARY: 336 mg/dL — AB (ref 65–99)
GLUCOSE-CAPILLARY: 237 mg/dL — AB (ref 65–99)
Glucose-Capillary: 219 mg/dL — ABNORMAL HIGH (ref 65–99)
Glucose-Capillary: 499 mg/dL — ABNORMAL HIGH (ref 65–99)

## 2017-12-20 LAB — CBG MONITORING, ED
Glucose-Capillary: >600 mg/dL (ref 65–99)
Glucose-Capillary: >600 mg/dL (ref 65–99)

## 2017-12-20 LAB — BLOOD GAS, VENOUS
Acid-base deficit: 12.8 mmol/L — ABNORMAL HIGH (ref 0.0–2.0)
BICARBONATE: 12.8 mmol/L — AB (ref 20.0–28.0)
O2 SAT: 84.9 %
PCO2 VEN: 29.7 mmHg — AB (ref 44.0–60.0)
Patient temperature: 98.6
pH, Ven: 7.259 (ref 7.250–7.430)
pO2, Ven: 57.6 mmHg — ABNORMAL HIGH (ref 32.0–45.0)

## 2017-12-20 LAB — BASIC METABOLIC PANEL
ANION GAP: 18 — AB (ref 5–15)
ANION GAP: 17 — AB (ref 5–15)
BUN: 30 mg/dL — AB (ref 6–20)
BUN: 29 mg/dL — ABNORMAL HIGH (ref 6–20)
CALCIUM: 9.1 mg/dL (ref 8.9–10.3)
CO2: 13 mmol/L — AB (ref 22–32)
CO2: 16 mmol/L — ABNORMAL LOW (ref 22–32)
CREATININE: 1.57 mg/dL — AB (ref 0.61–1.24)
Calcium: 8.2 mg/dL — ABNORMAL LOW (ref 8.9–10.3)
Chloride: 104 mmol/L (ref 101–111)
Chloride: 108 mmol/L (ref 101–111)
Creatinine, Ser: 1.68 mg/dL — ABNORMAL HIGH (ref 0.61–1.24)
GFR calc Af Amer: 54 mL/min — ABNORMAL LOW (ref >60)
GFR calc non Af Amer: 46 mL/min — ABNORMAL LOW (ref >60)
GFR, EST AFRICAN AMERICAN: 58 mL/min — AB (ref >60)
GFR, EST NON AFRICAN AMERICAN: 50 mL/min — AB (ref >60)
GLUCOSE: 781 mg/dL — AB (ref 65–99)
Glucose, Bld: 544 mg/dL (ref 65–99)
POTASSIUM: 5.1 mmol/L (ref 3.5–5.1)
Potassium: 4.7 mmol/L (ref 3.5–5.1)
SODIUM: 141 mmol/L (ref 135–145)
Sodium: 135 mmol/L (ref 135–145)

## 2017-12-20 LAB — HEPATIC FUNCTION PANEL
ALBUMIN: 3.5 g/dL (ref 3.5–5.0)
ALT: 16 U/L — ABNORMAL LOW (ref 17–63)
AST: 15 U/L (ref 15–41)
Alkaline Phosphatase: 183 U/L — ABNORMAL HIGH (ref 38–126)
Bilirubin, Direct: 0.2 mg/dL (ref 0.1–0.5)
Indirect Bilirubin: 1 mg/dL — ABNORMAL HIGH (ref 0.3–0.9)
TOTAL PROTEIN: 8.2 g/dL — AB (ref 6.5–8.1)
Total Bilirubin: 1.2 mg/dL (ref 0.3–1.2)

## 2017-12-20 LAB — I-STAT CHEM 8, ED
BUN: 39 mg/dL — AB (ref 6–20)
CHLORIDE: 105 mmol/L (ref 101–111)
CREATININE: 1.3 mg/dL — AB (ref 0.61–1.24)
Calcium, Ion: 1.07 mmol/L — ABNORMAL LOW (ref 1.15–1.40)
HCT: 54 % — ABNORMAL HIGH (ref 39.0–52.0)
Hemoglobin: 18.4 g/dL — ABNORMAL HIGH (ref 13.0–17.0)
POTASSIUM: 6.5 mmol/L — AB (ref 3.5–5.1)
Sodium: 138 mmol/L (ref 135–145)
TCO2: 17 mmol/L — ABNORMAL LOW (ref 22–32)

## 2017-12-20 LAB — MRSA PCR SCREENING: MRSA BY PCR: NEGATIVE

## 2017-12-20 LAB — LIPASE, BLOOD: LIPASE: 24 U/L (ref 11–51)

## 2017-12-20 MED ORDER — ENOXAPARIN SODIUM 40 MG/0.4ML ~~LOC~~ SOLN
40.0000 mg | SUBCUTANEOUS | Status: DC
  Administered 2017-12-21: 40 mg via SUBCUTANEOUS
  Filled 2017-12-20: qty 0.4

## 2017-12-20 MED ORDER — DEXTROSE-NACL 5-0.45 % IV SOLN: INTRAVENOUS | Status: DC

## 2017-12-20 MED ORDER — METOPROLOL TARTRATE 12.5 MG HALF TABLET
37.5000 mg | ORAL_TABLET | Freq: Two times a day (BID) | ORAL | Status: DC
  Administered 2017-12-21 (×2): 37.5 mg via ORAL
  Filled 2017-12-20 (×3): qty 3

## 2017-12-20 MED ORDER — SODIUM CHLORIDE 0.9 % IV BOLUS (SEPSIS): 500.0000 mL | Freq: Once | INTRAVENOUS | Status: DC

## 2017-12-20 MED ORDER — SODIUM CHLORIDE 0.9 % IV SOLN
1.0000 g | Freq: Once | INTRAVENOUS | Status: AC
  Administered 2017-12-20: 1 g via INTRAVENOUS
  Filled 2017-12-20: qty 10

## 2017-12-20 MED ORDER — SODIUM CHLORIDE 0.9 % IV BOLUS (SEPSIS)
1000.0000 mL | Freq: Once | INTRAVENOUS | Status: AC
  Administered 2017-12-20: 1000 mL via INTRAVENOUS

## 2017-12-20 MED ORDER — SODIUM CHLORIDE 0.9 % IV SOLN
INTRAVENOUS | Status: DC
  Administered 2017-12-20: 5.4 [IU]/h via INTRAVENOUS
  Filled 2017-12-20 (×2): qty 1

## 2017-12-20 MED ORDER — SODIUM CHLORIDE 0.9 % IV SOLN: INTRAVENOUS | Status: DC

## 2017-12-20 MED ORDER — ALBUTEROL SULFATE (2.5 MG/3ML) 0.083% IN NEBU: 2.5000 mg | INHALATION_SOLUTION | RESPIRATORY_TRACT | Status: DC | PRN

## 2017-12-20 MED ORDER — POTASSIUM CHLORIDE IN NACL 20-0.9 MEQ/L-% IV SOLN
Freq: Once | INTRAVENOUS | Status: DC
  Filled 2017-12-20: qty 1000

## 2017-12-20 MED ORDER — ASPIRIN 81 MG PO CHEW: 81.0000 mg | CHEWABLE_TABLET | Freq: Once | ORAL | Status: DC

## 2017-12-20 MED ORDER — ENOXAPARIN SODIUM 30 MG/0.3ML ~~LOC~~ SOLN: 30.0000 mg | SUBCUTANEOUS | Status: DC

## 2017-12-20 MED ORDER — SODIUM CHLORIDE 0.9 % IV SOLN
INTRAVENOUS | Status: DC
  Administered 2017-12-20: 16:00:00 via INTRAVENOUS

## 2017-12-20 MED ORDER — SIMVASTATIN 10 MG PO TABS
20.0000 mg | ORAL_TABLET | Freq: Every day | ORAL | Status: DC
  Administered 2017-12-20 – 2017-12-25 (×6): 20 mg via ORAL
  Filled 2017-12-20 (×6): qty 2

## 2017-12-20 MED ORDER — SODIUM CHLORIDE 0.9 % IV SOLN
Freq: Once | INTRAVENOUS | Status: AC
  Administered 2017-12-20: 14:00:00 via INTRAVENOUS

## 2017-12-20 MED ORDER — DEXTROSE-NACL 5-0.45 % IV SOLN
INTRAVENOUS | Status: DC
  Administered 2017-12-20: 23:00:00 via INTRAVENOUS

## 2017-12-20 MED ORDER — TRAZODONE HCL 50 MG PO TABS: 25.0000 mg | ORAL_TABLET | Freq: Every evening | ORAL | Status: DC | PRN

## 2017-12-20 MED ORDER — ASPIRIN EC 81 MG PO TBEC
81.0000 mg | DELAYED_RELEASE_TABLET | Freq: Every day | ORAL | Status: DC
  Administered 2017-12-21 – 2017-12-26 (×6): 81 mg via ORAL
  Filled 2017-12-20 (×6): qty 1

## 2017-12-20 NOTE — ED Notes (Signed)
Need another light BMP. Hemolyzed.

## 2017-12-20 NOTE — H&P (Signed)
[facility] History and Physical  Luis [name] PQZ:[id number] DOB: 1966-07-12 DOA: 12/20/2017  Referring physician:  PCP: Briscoe Deutscher, DO  Specialists:   Chief Complaint: fatigue, dehydration   HPI: Luis [name] is a 52 y.o. male with PMH of HTN, CHF, Obesity, DM presented with progressive generalized tiredness, fatigue for 1-2 weeks. He states that he has developed polydipsia, polyuria and diarrhea associated with weakness and tiredness. He had dry mouth. No vomiting. He reports 1-2 episodes of non bloody stools per day. He reports dizziness, lightheadedness, no fevers, no acute shortness of breath, no chest pains, no focal neuro weakness or paresthesias.  He states that he has been taking trulicity but does not check his glucose at home. In the ED: found to be in DKA. Received 1L NS, started on IV insulin. hospitalist is called for admission   Review of Systems: The patient denies anorexia, fever, weight loss,, vision loss, decreased hearing, hoarseness, chest pain, syncope, dyspnea on exertion, peripheral edema, balance deficits, hemoptysis, abdominal pain, melena, hematochezia, severe indigestion/heartburn, hematuria, incontinence, genital sores, muscle weakness, suspicious skin lesions, transient blindness, difficulty walking, depression, unusual weight change, abnormal bleeding, enlarged lymph nodes, angioedema, and breast masses.    Past Medical History:  Diagnosis Date  . Abnormal nuclear stress test 03/24/2016  . Atrial fibrillation ([place])   . CHF (congestive heart failure) ([place])   . DM (diabetes mellitus) ([place])   . Essential hypertension   . History of tobacco abuse   . HTN (hypertension)   . Hyperlipidemia   . Morbid obesity ([place])   . OSA (obstructive sleep apnea)   . Pneumonia 2017   Past Surgical History:  Procedure Laterality Date  . CARDIAC CATHETERIZATION N/A 03/24/2016   Procedure: Left Heart Cath and Coronary Angiography;  Surgeon: Leonie Man, MD;   Location: [place] [facility];  Service: Cardiovascular;  Laterality: N/A;  . FEMUR IM NAIL Right 2012  . FRACTURE SURGERY     Social History:  reports that he quit smoking about 8 years ago. His smoking use included cigarettes. He has a 10.00 pack-year smoking history. he has never used smokeless tobacco. He reports that he does not drink alcohol or use drugs. Home;  where does patient live--home, ALF, SNF? and with whom if at home? Yes;  Can patient participate in ADLs?  Allergies  Allergen Reactions  . Demerol [Meperidine] Itching  . Morphine And Related Itching    Family History  Problem Relation Age of Onset  . Diabetes Mellitus II Mother   . Diabetes type II Other   . Hypertension Maternal Grandfather   . Stroke Neg Hx   . Cancer Neg Hx     (be sure to complete)  Prior to Admission medications   Medication Sig Start Date End Date Taking? Authorizing Provider  albuterol (PROVENTIL HFA;VENTOLIN HFA) 108 (90 Base) MCG/ACT inhaler Inhale 2 puffs into the lungs every 4 (four) hours as needed for wheezing. 03/23/17   Angiulli, Lavon Paganini, PA-C  aspirin EC 81 MG EC tablet Take 1 tablet (81 mg total) by mouth daily. 03/26/16   Theodis Blaze, MD  blood glucose meter kit and supplies KIT Dispense based on patient and insurance preference. Use up to four times daily as directed. (FOR ICD-9 250.00, 250.01). 03/23/17   Angiulli, Lavon Paganini, PA-C  Dulaglutide (TRULICITY) 1.5 FH/5.4TG SOPN Inject 1 pen into the skin once a week. 07/22/17   Briscoe Deutscher, DO  Lancets (ACCU-CHEK SAFE-T PRO) lancets Use as instructed 03/23/17  Angiulli, Lavon Paganini, PA-C  loperamide (IMODIUM) 2 MG capsule Take 1 capsule (2 mg total) by mouth 4 (four) times daily as needed for diarrhea or loose stools. 08/20/17   Briscoe Deutscher, DO  losartan (COZAAR) 25 MG tablet Take 1 tablet (25 mg total) by mouth daily. 11/19/17   Briscoe Deutscher, DO  Metoprolol Tartrate 37.5 MG TABS Take 37.5 mg by mouth 2 (two) times daily. 07/22/17    Briscoe Deutscher, DO  Probiotic Product (PROBIOTIC-10 PO) Take by mouth.    [provider]  simvastatin (ZOCOR) 20 MG tablet Take 1 tablet (20 mg total) by mouth at bedtime. 11/19/17   Briscoe Deutscher, DO  torsemide (DEMADEX) 10 MG tablet Take 1 - 2 tablets daily for swelling Patient not taking: Reported on 11/19/2017 10/08/17   Briscoe Deutscher, DO  traZODone (DESYREL) 50 MG tablet Take 0.5-1 tablets (25-50 mg total) by mouth at bedtime as needed for sleep. 11/19/17   Briscoe Deutscher, DO   Physical Exam: Vitals:   12/20/17 1354 12/20/17 1456  BP: 134/83 128/69  Pulse: (!) 103 98  Resp: 18 18  Temp:    SpO2: 96% 98%     General:  Alert.   Eyes: eom-  ENT: no oral ulcers   Neck: supple, no JVD  Cardiovascular: s1,s2 rrr  Respiratory: CTA BL  Abdomen: soft, nt, nd   Skin: no rash. Few gluteal superficial ulcer   Musculoskeletal: no leg edema   Psychiatric: no hallucinations   Neurologic: CN 2-12 intact. Motor 5/5 BL  Labs on Admission:  Basic Metabolic Panel: Recent Labs  Lab 12/20/17 1339 12/20/17 1411  NA 135 138  K 5.1 6.5*  CL 104 105  CO2 13*  --   GLUCOSE 781* >700*  BUN 30* 39*  CREATININE 1.68* 1.30*  CALCIUM 8.2*  --    Liver Function Tests: No results for input(s): AST, ALT, ALKPHOS, BILITOT, PROT, ALBUMIN in the last 168 hours. No results for input(s): LIPASE, AMYLASE in the last 168 hours. No results for input(s): AMMONIA in the last 168 hours. CBC: Recent Labs  Lab 12/20/17 1210 12/20/17 1411  WBC 12.0*  --   HGB 16.6 18.4*  HCT 50.1 54.0*  MCV 97.3  --   PLT 287  --    Cardiac Enzymes: No results for input(s): CKTOTAL, CKMB, CKMBINDEX, TROPONINI in the last 168 hours.  BNP (last 3 results) Recent Labs    03/01/17 1840 03/15/17 0701  BNP 243.5* 261.0*    ProBNP (last 3 results) Recent Labs    06/11/17 0916 11/19/17 0941  PROBNP 167.0* 75.0    CBG: Recent Labs  Lab 12/20/17 1214 12/20/17 1440  GLUCAP >600* >600*     Radiological Exams on Admission: Dg Chest Portable 1 View  Result Date: 12/20/2017 CLINICAL DATA:  Weakness EXAM: PORTABLE CHEST 1 VIEW COMPARISON:  11/19/2017 FINDINGS: 1410 hours. The lungs are clear without focal pneumonia, edema, pneumothorax or pleural effusion. The cardiopericardial silhouette is within normal limits for size. The visualized bony structures of the thorax are intact. IMPRESSION: No active disease. Electronically Signed   By: Misty [facility] M.D.   On: 12/20/2017 14:40    EKG: Independently reviewed.   Assessment/Plan Active Problems:   Essential hypertension   Diabetes mellitus type 2 in obese (HCC)   Diarrhea   DKA (diabetic ketoacidoses) ([place])   52 y.o. male with PMH of HTN, CHF, Obesity, DM presented with progressive generalized tiredness, fatigue, polydipsia, polyuria and found to be in DKA  DKA. AG acidosis, hyperglycemia, polydipsia, polyuria. Severe dehydration on admission. Received 1L NS in ED. No s/s of fluid overload, cardiopulmonary exam is unremarkable, clear on exam. Will bolus another NS, cont iv hydration, cont iv insulin drip per protocol, frequent labs/bmp, replace electrolytes as needed. Transition to SQ insulin regimen per protocol, when appropriate. Close monitor in SDU  DM. Uncontrolled with DKA. Home regimen trulicity. Will check ha1c. May need insulin regimen at discharge  AKI. Due to dehydration. Cont iv fluids. Monitor urine output, I/o, labs. hold losartan, torsemide    H/o CHF. Echo (2017): lvef-35-40%. Then improved in 2018: LVEF 50-55%. No s/s of acute fluid overload on exam, but dehydrated. Will hold torsemide. Resume if needed. Cont BB, aspirin. Will close monitor to avoid fluid overload. I/o, daily weight   HTN. BP is stable. Cont BB, hold diuretic and losartan due to renal issue today   Reported few episodes of diarrhea. Abdominal exam is benign. No abdominal pains. No recent antibiotic use. Check lipase, LFTs. Cont supportive  care   None;  if consultant consulted, please document name and whether formally or informally consulted  Code Status: full (must indicate code status--if unknown or must be presumed, indicate so) Family Communication: d/w patient, his family, ED (indicate person spoken with, if applicable, with phone number if by telephone) Disposition Plan: home when stable  (indicate anticipated LOS)  Time spent: >45 minutes   Kinnie Feil [facility] Pager [phone]  If 7PM-7AM, please contact night-coverage [email or web address] Password [facility] 12/20/2017, 3:26 PM

## 2017-12-20 NOTE — ED Notes (Signed)
Bed: WLPT3 Expected date:  Expected time:  Means of arrival:  Comments: 

## 2017-12-20 NOTE — ED Notes (Signed)
ED TO INPATIENT HANDOFF REPORT  Name/Age/Gender Luis [name] 52 y.o. male  Code Status    Code Status Orders  (From admission, onward)        Start     Ordered   12/20/17 1545  Full code  Continuous     12/20/17 1547    Code Status History    Date Active Date Inactive Code Status Order ID Comments User Context   12/20/2017 15:47 12/20/2017 15:47 Full Code [id number]  Kinnie Feil, MD ED   03/17/2017 20:05 03/23/2017 20:05 Partial Code [id number]  Cathlyn Parsons, PA-C Inpatient   03/17/2017 20:05 03/17/2017 20:05 Full Code [id number]  Cathlyn Parsons, PA-C Inpatient   03/12/2017 12:04 03/17/2017 18:30 Partial Code [id number]  Javier Glazier, MD Inpatient   03/01/2017 20:10 03/12/2017 12:04 Full Code [id number]  Norman Herrlich, MD ED   03/19/2016 22:19 03/26/2016 15:49 Full Code [id number]  Toy Baker, MD Inpatient      Home/SNF/Other Home  Chief Complaint Hyperglycemia  Level of Care/Admitting Diagnosis ED Disposition    ED Disposition Condition [facility] Area: [facility] [[id number]]  Level of Care: Stepdown [14]  Admit to SDU based on following criteria: Hemodynamic compromise or significant risk of instability:  Patient requiring short term acute titration and management of vasoactive drips, and invasive monitoring (i.e., CVP and Arterial line).  Admit to SDU based on following criteria: Respiratory Distress:  Frequent assessment and/or intervention to maintain adequate ventilation/respiration, pulmonary toilet, and respiratory treatment.  Diagnosis: DKA (diabetic ketoacidoses) [facility]) [[id number]]  Admitting Physician: Kinnie Feil [[id number]]  Attending Physician: Kinnie Feil [[id number]]  Estimated length of stay: past midnight tomorrow  Certification:: I certify this patient will need inpatient services for at least 2 midnights  PT Class (Do Not Modify): Inpatient [101]  PT Acc Code (Do Not Modify): Private [1]        Medical History Past Medical History:  Diagnosis Date  . Abnormal nuclear stress test 03/24/2016  . Atrial fibrillation ([place])   . CHF (congestive heart failure) ([place])   . DM (diabetes mellitus) ([place])   . Essential hypertension   . History of tobacco abuse   . HTN (hypertension)   . Hyperlipidemia   . Morbid obesity ([place])   . OSA (obstructive sleep apnea)   . Pneumonia 2017    Allergies Allergies  Allergen Reactions  . Demerol [Meperidine] Itching  . Morphine And Related Itching    IV Location/Drains/Wounds Patient Lines/Drains/Airways Status   Active Line/Drains/Airways    Name:   Placement date:   Placement time:   Site:   Days:   Peripheral IV 12/20/17 Right Hand   12/20/17    1110    Hand   less than 1   Peripheral IV 12/20/17 Left Antecubital   12/20/17    1408    Antecubital   less than 1          Labs/Imaging Results for orders placed or performed during the hospital encounter of 12/20/17 (from the past 48 hour(s))  Urinalysis, Routine w reflex microscopic     Status: Abnormal   Collection Time: 12/20/17 11:50 AM  Result Value Ref Range   Color, Urine STRAW (A) YELLOW   APPearance CLEAR CLEAR   Specific Gravity, Urine 1.028 1.005 - 1.030   pH 5.0 5.0 - 8.0   Glucose, UA >=500 (A) NEGATIVE mg/dL   Hgb urine dipstick SMALL (A) NEGATIVE   Bilirubin Urine NEGATIVE  NEGATIVE   Ketones, ur 80 (A) NEGATIVE mg/dL   Protein, ur NEGATIVE NEGATIVE mg/dL   Nitrite NEGATIVE NEGATIVE   Leukocytes, UA NEGATIVE NEGATIVE   RBC / HPF 0-5 0 - 5 RBC/hpf   WBC, UA 0-5 0 - 5 WBC/hpf   Bacteria, UA NONE SEEN NONE SEEN   Squamous Epithelial / LPF 0-5 (A) NONE SEEN   Mucus PRESENT     Comment: Performed at [facility], [place] [address] [place], [place] [address]  CBC     Status: Abnormal   Collection Time: 12/20/17 12:10 PM  Result Value Ref Range   WBC 12.0 (H) 4.0 - 10.5 K/uL   RBC 5.15 4.22 - 5.81 MIL/uL   Hemoglobin 16.6 13.0 - 17.0 g/dL   HCT  50.1 39.0 - 52.0 %   MCV 97.3 78.0 - 100.0 fL   MCH 32.2 26.0 - 34.0 pg   MCHC 33.1 30.0 - 36.0 g/dL   RDW 14.3 11.5 - 15.5 %   Platelets 287 150 - 400 K/uL    Comment: Performed at [facility], [place] [address], [place], [place] [address]  CBG monitoring, ED     Status: Abnormal   Collection Time: 12/20/17 12:14 PM  Result Value Ref Range   Glucose-Capillary >600 (HH) 65 - 99 mg/dL  Basic metabolic panel     Status: Abnormal   Collection Time: 12/20/17  1:39 PM  Result Value Ref Range   Sodium 135 135 - 145 mmol/L   Potassium 5.1 3.5 - 5.1 mmol/L   Chloride 104 101 - 111 mmol/L   CO2 13 (L) 22 - 32 mmol/L   Glucose, Bld 781 (HH) 65 - 99 mg/dL    Comment: CRITICAL RESULT CALLED TO, READ BACK BY AND VERIFIED WITH: BARHAM,R AT 1440 ON [id number] BY HOOKER,B    BUN 30 (H) 6 - 20 mg/dL   Creatinine, Ser 1.68 (H) 0.61 - 1.24 mg/dL   Calcium 8.2 (L) 8.9 - 10.3 mg/dL   GFR calc non Af Amer 46 (L) >60 mL/min   GFR calc Af Amer 54 (L) >60 mL/min    Comment: (NOTE) The eGFR has been calculated using the CKD EPI equation. This calculation has not been validated in all clinical situations. eGFR's persistently <60 mL/min signify possible Chronic Kidney Disease.    Anion gap 18 (H) 5 - 15    Comment: Performed at [facility], [place] [address] [place], [place] [address]  Blood gas, venous     Status: Abnormal   Collection Time: 12/20/17  1:45 PM  Result Value Ref Range   pH, Ven 7.259 7.250 - 7.430   pCO2, Ven 29.7 (L) 44.0 - 60.0 mmHg   pO2, Ven 57.6 (H) 32.0 - 45.0 mmHg   Bicarbonate 12.8 (L) 20.0 - 28.0 mmol/L   Acid-base deficit 12.8 (H) 0.0 - 2.0 mmol/L   O2 Saturation 84.9 %   Patient temperature 98.6    Collection site VEIN    Drawn by COLLECTED BY LABORATORY    Sample type VENOUS     Comment: Performed at [place] [address] [place], [place] [address]  I-Stat Chem 8, ED     Status: Abnormal   Collection Time:  12/20/17  2:11 PM  Result Value Ref Range   Sodium 138 135 - 145 mmol/L   Potassium 6.5 (HH) 3.5 - 5.1 mmol/L   Chloride 105 101 - 111 mmol/L   BUN 39 (H)  6 - 20 mg/dL   Creatinine, Ser 1.30 (H) 0.61 - 1.24 mg/dL   Glucose, Bld >700 (HH) 65 - 99 mg/dL   Calcium, Ion 1.07 (L) 1.15 - 1.40 mmol/L   TCO2 17 (L) 22 - 32 mmol/L   Hemoglobin 18.4 (H) 13.0 - 17.0 g/dL   HCT 54.0 (H) 39.0 - 52.0 %   Comment NOTIFIED PHYSICIAN   CBG monitoring, ED     Status: Abnormal   Collection Time: 12/20/17  2:40 PM  Result Value Ref Range   Glucose-Capillary >600 (HH) 65 - 99 mg/dL  CBG monitoring, ED     Status: Abnormal   Collection Time: 12/20/17  3:59 PM  Result Value Ref Range   Glucose-Capillary >600 (HH) 65 - 99 mg/dL   Dg Chest Portable 1 View  Result Date: 12/20/2017 CLINICAL DATA:  Weakness EXAM: PORTABLE CHEST 1 VIEW COMPARISON:  11/19/2017 FINDINGS: 1410 hours. The lungs are clear without focal pneumonia, edema, pneumothorax or pleural effusion. The cardiopericardial silhouette is within normal limits for size. The visualized bony structures of the thorax are intact. IMPRESSION: No active disease. Electronically Signed   By: Misty [facility] M.D.   On: 12/20/2017 14:40    Pending Labs Unresulted Labs (From admission, onward)   Start     Ordered   12/20/17 1554  Hepatic function panel  Add-on,   R     12/20/17 1553   12/20/17 1554  Lipase, blood  Add-on,   R     12/20/17 1553   12/20/17 1551  Hemoglobin A1c  Add-on,   R     12/20/17 1550   12/20/17 1547  Urinalysis, Routine w reflex microscopic  Once,   R     12/20/17 1547   12/20/17 5597  Basic metabolic panel  STAT Now then every 4 hours ,   STAT     12/20/17 1547      Vitals/Pain Today's Vitals   12/20/17 1149 12/20/17 1354 12/20/17 1447 12/20/17 1456  BP: (!) 135/91 134/83  128/69  Pulse: (!) 111 (!) 103  98  Resp: _0 Temp: 97.8 F (36.6 C)     TempSrc: Oral     SpO2: 95% 96%  98%  Height:   _1  (1.905 m)    PainSc:        Isolation Precautions No active isolations  Medications Medications  insulin regular (NOVOLIN R,HUMULIN R) 100 Units in sodium chloride 0.9 % 100 mL (1 Units/mL) infusion (10.8 Units/hr Intravenous Rate/Dose Verify 12/20/17 1600)  sodium chloride 0.9 % bolus 1,000 mL (not administered)  albuterol (PROVENTIL) (2.5 MG/3ML) 0.083% nebulizer solution 2.5 mg (not administered)  aspirin EC tablet 81 mg (not administered)  Metoprolol Tartrate TABS 37.5 mg (not administered)  simvastatin (ZOCOR) tablet 20 mg (not administered)  traZODone (DESYREL) tablet 25-50 mg (not administered)  dextrose 5 %-0.45 % sodium chloride infusion (not administered)  enoxaparin (LOVENOX) injection 30 mg (not administered)  0.9 %  sodium chloride infusion (not administered)  sodium chloride 0.9 % bolus 1,000 mL (0 mLs Intravenous Stopped 12/20/17 1521)  0.9 %  sodium chloride infusion ( Intravenous New Bag/Given 12/20/17 1351)  calcium gluconate 1 g in sodium chloride 0.9 % 100 mL IVPB (0 g Intravenous Stopped 12/20/17 1454)    Mobility walks with device

## 2017-12-20 NOTE — ED Triage Notes (Signed)
He tells me he has been weak and has had polydipsia and polyuria and an occasional diarrhea stool x 1 week. He states he is diabetic, however, he does not perform cbg'x at home and is on Trulicity (only) for his D.M. He arrives in no distress and is oriented x 4.

## 2017-12-20 NOTE — ED Provider Notes (Signed)
[facility] DEPT Provider Note   CSN: [id number] Arrival date & time: 12/20/17  1103     History   Chief Complaint Chief Complaint  Patient presents with  . Hyperglycemia  . Diarrhea    HPI Luis [name] is a 52 y.o. male.  HPI  52 year old male with history of CHF, A. fib, hypertension, diabetes, here with generalized fatigue.  The patient states that over the last 1-2 weeks, is been progressively more fatigued.  Had dry mouth and frequent urination.  He said weight loss and nausea.  No abdominal pain.  He is on Trulicity from his PCP and does not regularly check his blood sugars.  He eats a somewhat unhealthy diet.  Denies any fevers or chills.  He does endorse mild, intermittent shortness of breath but no current shortness of breath.  No known fevers.  He also states he has been incredibly lightheaded every time he stands up and feels like he is going to pass out.  Past Medical History:  Diagnosis Date  . Abnormal nuclear stress test 03/24/2016  . Atrial fibrillation ([place])   . CHF (congestive heart failure) ([place])   . DM (diabetes mellitus) ([place])   . Essential hypertension   . History of tobacco abuse   . HTN (hypertension)   . Hyperlipidemia   . Morbid obesity ([place])   . OSA (obstructive sleep apnea)   . Pneumonia 2017    Patient Active Problem List   Diagnosis Date Noted  . DKA (diabetic ketoacidoses) ([place]) 12/20/2017  . Diarrhea 08/20/2017  . Onychomycosis 06/14/2017  . Bilateral edema of lower extremity 06/14/2017  . Hyperlipidemia   . Difficulty walking 06/11/2017  . OSA (obstructive sleep apnea) 03/31/2017  . Debility 03/17/2017  . NICM (nonischemic cardiomyopathy) ([place])   . Morbid obesity ([place])   . Former smoker   . Diabetes mellitus type 2 in obese ([place])   . Diastolic congestive heart failure ([place])   . Essential hypertension 03/19/2016  . Paroxysmal atrial fibrillation ([place]) 03/19/2016    Past Surgical History:    Procedure Laterality Date  . CARDIAC CATHETERIZATION N/A 03/24/2016   Procedure: Left Heart Cath and Coronary Angiography;  Surgeon: Leonie Man, MD;  Location: [facility];  Service: Cardiovascular;  Laterality: N/A;  . FEMUR IM NAIL Right 2012  . FRACTURE SURGERY         Home Medications    Prior to Admission medications   Medication Sig Start Date End Date Taking? Authorizing Provider  albuterol (PROVENTIL HFA;VENTOLIN HFA) 108 (90 Base) MCG/ACT inhaler Inhale 2 puffs into the lungs every 4 (four) hours as needed for wheezing. 03/23/17   Angiulli, Lavon Paganini, PA-C  aspirin EC 81 MG EC tablet Take 1 tablet (81 mg total) by mouth daily. 03/26/16   Theodis Blaze, MD  blood glucose meter kit and supplies KIT Dispense based on patient and insurance preference. Use up to four times daily as directed. (FOR ICD-9 250.00, 250.01). 03/23/17   Angiulli, Lavon Paganini, PA-C  Dulaglutide (TRULICITY) 1.5 YH/9.0BP SOPN Inject 1 pen into the skin once a week. 07/22/17   Briscoe Deutscher, DO  Lancets (ACCU-CHEK SAFE-T PRO) lancets Use as instructed 03/23/17   Angiulli, Lavon Paganini, PA-C  loperamide (IMODIUM) 2 MG capsule Take 1 capsule (2 mg total) by mouth 4 (four) times daily as needed for diarrhea or loose stools. 08/20/17   Briscoe Deutscher, DO  losartan (COZAAR) 25 MG tablet Take 1 tablet (25 mg total) by  mouth daily. 11/19/17   Briscoe Deutscher, DO  Metoprolol Tartrate 37.5 MG TABS Take 37.5 mg by mouth 2 (two) times daily. 07/22/17   Briscoe Deutscher, DO  Probiotic Product (PROBIOTIC-10 PO) Take by mouth.    [provider]  simvastatin (ZOCOR) 20 MG tablet Take 1 tablet (20 mg total) by mouth at bedtime. 11/19/17   Briscoe Deutscher, DO  torsemide (DEMADEX) 10 MG tablet Take 1 - 2 tablets daily for swelling Patient not taking: Reported on 11/19/2017 10/08/17   Briscoe Deutscher, DO  traZODone (DESYREL) 50 MG tablet Take 0.5-1 tablets (25-50 mg total) by mouth at bedtime as needed for sleep. 11/19/17    Briscoe Deutscher, DO    Family History Family History  Problem Relation Age of Onset  . Diabetes Mellitus II Mother   . Diabetes type II Other   . Hypertension Maternal Grandfather   . Stroke Neg Hx   . Cancer Neg Hx     Social History Social History   Tobacco Use  . Smoking status: Former Smoker    Packs/day: 0.50    Years: 20.00    Pack years: 10.00    Types: Cigarettes    Last attempt to quit: 03/02/2009    Years since quitting: 8.8  . Smokeless tobacco: Never Used  Substance Use Topics  . Alcohol use: No  . Drug use: No     Allergies   Demerol [meperidine] and Morphine and related   Review of Systems Review of Systems  Constitutional: Positive for fatigue. Negative for chills and fever.  HENT: Negative for congestion and rhinorrhea.   Eyes: Negative for visual disturbance.  Respiratory: Positive for shortness of breath. Negative for cough and wheezing.   Cardiovascular: Negative for chest pain and leg swelling.  Gastrointestinal: Negative for abdominal pain, diarrhea, nausea and vomiting.  Genitourinary: Negative for dysuria and flank pain.  Musculoskeletal: Negative for neck pain and neck stiffness.  Skin: Negative for rash and wound.  Allergic/Immunologic: Negative for immunocompromised state.  Neurological: Positive for weakness and light-headedness. Negative for syncope and headaches.  All other systems reviewed and are negative.    Physical Exam Updated Vital Signs BP 128/69   Pulse 98   Temp 97.8 F (36.6 C) (Oral)   Resp 18   Ht '6\' 3"'$  (1.905 m)   SpO2 98%   BMI 46.35 kg/m   Physical Exam  Constitutional: He is oriented to person, place, and time. He appears well-developed. No distress.  HENT:  Head: Normocephalic and atraumatic.  Markedly dry mucous membranes  Eyes: Conjunctivae are normal.  Neck: Neck supple.  Cardiovascular: Regular rhythm and normal heart sounds. Exam reveals no friction rub.  No murmur heard. Tachycardia   Pulmonary/Chest: Effort normal and breath sounds normal. No respiratory distress. He has no wheezes. He has no rales.  Tachypnea with clear breath sounds  Abdominal: He exhibits no distension.  Musculoskeletal: He exhibits no edema.  Neurological: He is alert and oriented to person, place, and time. He exhibits normal muscle tone.  Skin: Skin is warm. Capillary refill takes less than 2 seconds.  Psychiatric: He has a normal mood and affect.  Nursing note and vitals reviewed.    ED Treatments / Results  Labs (all labs ordered are listed, but only abnormal results are displayed) Labs Reviewed  CBC - Abnormal; Notable for the following components:      Result Value   WBC 12.0 (*)    All other components within normal limits  URINALYSIS, ROUTINE  W REFLEX MICROSCOPIC - Abnormal; Notable for the following components:   Color, Urine STRAW (*)    Glucose, UA >=500 (*)    Hgb urine dipstick SMALL (*)    Ketones, ur 80 (*)    Squamous Epithelial / LPF 0-5 (*)    All other components within normal limits  BLOOD GAS, VENOUS - Abnormal; Notable for the following components:   pCO2, Ven 29.7 (*)    pO2, Ven 57.6 (*)    Bicarbonate 12.8 (*)    Acid-base deficit 12.8 (*)    All other components within normal limits  BASIC METABOLIC PANEL - Abnormal; Notable for the following components:   CO2 13 (*)    Glucose, Bld 781 (*)    BUN 30 (*)    Creatinine, Ser 1.68 (*)    Calcium 8.2 (*)    GFR calc non Af Amer 46 (*)    GFR calc Af Amer 54 (*)    Anion gap 18 (*)    All other components within normal limits  CBG MONITORING, ED - Abnormal; Notable for the following components:   Glucose-Capillary >600 (*)    All other components within normal limits  I-STAT CHEM 8, ED - Abnormal; Notable for the following components:   Potassium 6.5 (*)    BUN 39 (*)    Creatinine, Ser 1.30 (*)    Glucose, Bld >700 (*)    Calcium, Ion 1.07 (*)    TCO2 17 (*)    Hemoglobin 18.4 (*)    HCT 54.0 (*)     All other components within normal limits  CBG MONITORING, ED - Abnormal; Notable for the following components:   Glucose-Capillary >600 (*)    All other components within normal limits    EKG Sinus tachycardia.  Borderline report abnormality.  T wave inversions noted in inferolateral leads consistent with re-pole.  QRS 104, QTc 450.  No apparent acute ischemic change.  No peaking of T waves.  Radiology Dg Chest Portable 1 View  Result Date: 12/20/2017 CLINICAL DATA:  Weakness EXAM: PORTABLE CHEST 1 VIEW COMPARISON:  11/19/2017 FINDINGS: 1410 hours. The lungs are clear without focal pneumonia, edema, pneumothorax or pleural effusion. The cardiopericardial silhouette is within normal limits for size. The visualized bony structures of the thorax are intact. IMPRESSION: No active disease. Electronically Signed   By: Misty Stanley M.D.   On: 12/20/2017 14:40    Procedures .Critical Care Performed by: Duffy Bruce, MD Authorized by: Duffy Bruce, MD   Critical care provider statement:    Critical care time (minutes):  35   Critical care time was exclusive of:  Separately billable procedures and treating other patients and teaching time   Critical care was necessary to treat or prevent imminent or life-threatening deterioration of the following conditions:  Circulatory failure, metabolic crisis, cardiac failure and renal failure   Critical care was time spent personally by me on the following activities:  Development of treatment plan with patient or surrogate, discussions with consultants, evaluation of patient's response to treatment, examination of patient, obtaining history from patient or surrogate, ordering and performing treatments and interventions, ordering and review of laboratory studies, ordering and review of radiographic studies, pulse oximetry, re-evaluation of patient's condition and review of old charts   I assumed direction of critical care for this patient from another  provider in my specialty: no     (including critical care time)  Medications Ordered in ED Medications  dextrose 5 %-0.45 % sodium  chloride infusion (not administered)  insulin regular (NOVOLIN R,HUMULIN R) 100 Units in sodium chloride 0.9 % 100 mL (1 Units/mL) infusion (5.4 Units/hr Intravenous New Bag/Given 12/20/17 1449)  sodium chloride 0.9 % bolus 1,000 mL (not administered)  0.9 %  sodium chloride infusion (not administered)  sodium chloride 0.9 % bolus 1,000 mL (0 mLs Intravenous Stopped 12/20/17 1521)  0.9 %  sodium chloride infusion ( Intravenous New Bag/Given 12/20/17 1351)  calcium gluconate 1 g in sodium chloride 0.9 % 100 mL IVPB (0 g Intravenous Stopped 12/20/17 1454)     Initial Impression / Assessment and Plan / ED Course  I have reviewed the triage vital signs and the nursing notes.  Pertinent labs & imaging results that were available during my care of the patient were reviewed by me and considered in my medical decision making (see chart for details).     A 52 year old male here with polyuria, polydipsia, and polyphagia.  Lab work is consistent with significant DKA with acidosis, AK I, and ketonuria.  Patient given 1 L normal saline.  Given his history of heart failure, will start 1.5 times maintenance fluids, placed on an insulin drip given that potassium is 5.1, and admit to stepdown.  Patient is alert and oriented.  I suspect this is due to nonadherence as well as failure of his Trulicity.  He has no apparent infectious triggers.  Chest x-ray is clear.  EKG nonischemic.  Final Clinical Impressions(s) / ED Diagnoses   Final diagnoses:  Diabetic ketoacidosis without coma associated with type 2 diabetes mellitus [facility])    ED Discharge Orders    None       Duffy Bruce, MD 12/20/17 1547

## 2017-12-21 DIAGNOSIS — L8990 Pressure ulcer of unspecified site, unspecified stage: Secondary | ICD-10-CM

## 2017-12-21 LAB — BASIC METABOLIC PANEL
ANION GAP: 10 (ref 5–15)
ANION GAP: 10 (ref 5–15)
Anion gap: 10 (ref 5–15)
BUN: 21 mg/dL — ABNORMAL HIGH (ref 6–20)
BUN: 23 mg/dL — ABNORMAL HIGH (ref 6–20)
BUN: 24 mg/dL — ABNORMAL HIGH (ref 6–20)
CALCIUM: 9.2 mg/dL (ref 8.9–10.3)
CALCIUM: 9.3 mg/dL (ref 8.9–10.3)
CHLORIDE: 117 mmol/L — AB (ref 101–111)
CHLORIDE: 122 mmol/L — AB (ref 101–111)
CO2: 21 mmol/L — ABNORMAL LOW (ref 22–32)
CO2: 14 mmol/L — ABNORMAL LOW (ref 22–32)
CO2: 21 mmol/L — AB (ref 22–32)
Calcium: 9.1 mg/dL (ref 8.9–10.3)
Chloride: 116 mmol/L — ABNORMAL HIGH (ref 101–111)
Creatinine, Ser: 1.16 mg/dL (ref 0.61–1.24)
Creatinine, Ser: 1.33 mg/dL — ABNORMAL HIGH (ref 0.61–1.24)
Creatinine, Ser: 1.42 mg/dL — ABNORMAL HIGH (ref 0.61–1.24)
GFR calc non Af Amer: 57 mL/min — ABNORMAL LOW (ref >60)
GFR calc non Af Amer: >60 mL/min (ref >60)
GFR calc non Af Amer: >60 mL/min (ref >60)
GLUCOSE: 236 mg/dL — AB (ref 65–99)
Glucose, Bld: 194 mg/dL — ABNORMAL HIGH (ref 65–99)
Glucose, Bld: 153 mg/dL — ABNORMAL HIGH (ref 65–99)
POTASSIUM: 4 mmol/L (ref 3.5–5.1)
POTASSIUM: 4 mmol/L (ref 3.5–5.1)
POTASSIUM: 4.6 mmol/L (ref 3.5–5.1)
Sodium: 146 mmol/L — ABNORMAL HIGH (ref 135–145)
Sodium: 147 mmol/L — ABNORMAL HIGH (ref 135–145)
Sodium: 148 mmol/L — ABNORMAL HIGH (ref 135–145)

## 2017-12-21 LAB — GLUCOSE, CAPILLARY
GLUCOSE-CAPILLARY: 213 mg/dL — AB (ref 65–99)
GLUCOSE-CAPILLARY: 183 mg/dL — AB (ref 65–99)
GLUCOSE-CAPILLARY: 138 mg/dL — AB (ref 65–99)
GLUCOSE-CAPILLARY: 147 mg/dL — AB (ref 65–99)
GLUCOSE-CAPILLARY: 416 mg/dL — AB (ref 65–99)
GLUCOSE-CAPILLARY: 435 mg/dL — AB (ref 65–99)
GLUCOSE-CAPILLARY: 411 mg/dL — AB (ref 65–99)
Glucose-Capillary: 196 mg/dL — ABNORMAL HIGH (ref 65–99)
Glucose-Capillary: 141 mg/dL — ABNORMAL HIGH (ref 65–99)
Glucose-Capillary: 144 mg/dL — ABNORMAL HIGH (ref 65–99)
Glucose-Capillary: 146 mg/dL — ABNORMAL HIGH (ref 65–99)
Glucose-Capillary: 132 mg/dL — ABNORMAL HIGH (ref 65–99)

## 2017-12-21 LAB — HEMOGLOBIN A1C
Hgb A1c MFr Bld: 11 % — ABNORMAL HIGH (ref 4.8–5.6)
MEAN PLASMA GLUCOSE: 269 mg/dL

## 2017-12-21 MED ORDER — INSULIN ASPART 100 UNIT/ML ~~LOC~~ SOLN
0.0000 [IU] | Freq: Three times a day (TID) | SUBCUTANEOUS | Status: DC
  Administered 2017-12-21 – 2017-12-22 (×2): 20 [IU] via SUBCUTANEOUS
  Administered 2017-12-22: 15 [IU] via SUBCUTANEOUS
  Administered 2017-12-22: 20 [IU] via SUBCUTANEOUS
  Administered 2017-12-23 (×2): 11 [IU] via SUBCUTANEOUS
  Administered 2017-12-23: 7 [IU] via SUBCUTANEOUS
  Administered 2017-12-24: 4 [IU] via SUBCUTANEOUS
  Administered 2017-12-24 (×2): 7 [IU] via SUBCUTANEOUS
  Administered 2017-12-25: 2 [IU] via SUBCUTANEOUS
  Administered 2017-12-25: 7 [IU] via SUBCUTANEOUS
  Administered 2017-12-25: 15 [IU] via SUBCUTANEOUS
  Administered 2017-12-26: 4 [IU] via SUBCUTANEOUS

## 2017-12-21 MED ORDER — INSULIN ASPART 100 UNIT/ML ~~LOC~~ SOLN: 0.0000 [IU] | Freq: Three times a day (TID) | SUBCUTANEOUS | Status: DC

## 2017-12-21 MED ORDER — INSULIN ASPART 100 UNIT/ML ~~LOC~~ SOLN: 0.0000 [IU] | Freq: Every day | SUBCUTANEOUS | Status: DC

## 2017-12-21 MED ORDER — INSULIN GLARGINE 100 UNIT/ML ~~LOC~~ SOLN
25.0000 [IU] | Freq: Every day | SUBCUTANEOUS | Status: DC
  Administered 2017-12-21: 25 [IU] via SUBCUTANEOUS
  Filled 2017-12-21: qty 0.25

## 2017-12-21 MED ORDER — INSULIN ASPART 100 UNIT/ML ~~LOC~~ SOLN
10.0000 [IU] | Freq: Once | SUBCUTANEOUS | Status: AC
  Administered 2017-12-21: 10 [IU] via SUBCUTANEOUS

## 2017-12-21 MED ORDER — INSULIN GLARGINE 100 UNIT/ML ~~LOC~~ SOLN
25.0000 [IU] | Freq: Two times a day (BID) | SUBCUTANEOUS | Status: DC
  Administered 2017-12-21 – 2017-12-22 (×2): 25 [IU] via SUBCUTANEOUS
  Filled 2017-12-21 (×2): qty 0.25

## 2017-12-21 MED ORDER — INSULIN ASPART 100 UNIT/ML ~~LOC~~ SOLN
0.0000 [IU] | Freq: Every day | SUBCUTANEOUS | Status: DC
  Administered 2017-12-22: 4 [IU] via SUBCUTANEOUS

## 2017-12-21 NOTE — Progress Notes (Signed)
Inpatient Diabetes Program Recommendations  AACE/ADA: New Consensus Statement on Inpatient Glycemic Control (2015)  Target Ranges:  Prepandial:   less than 140 mg/dL      Peak postprandial:   less than 180 mg/dL (1-2 hours)      Critically ill patients:  140 - 180 mg/dL   Lab Results  Component Value Date   GLUCAP 416 (H) 12/21/2017   HGBA1C 11.0 (H) 12/20/2017    Review of Glycemic Control  CBG 416 mg/dL at 3383. Novolog 20 units given. Pt had eaten 60% of meal.   Inpatient Diabetes Program Recommendations:     Consider addition of Novolog 6 units tidwc for meal coverage insulin.  Will follow-up in am.  Thank you. Ailene Ards, RD, LDN, CDE Inpatient Diabetes Coordinator [phone]

## 2017-12-21 NOTE — Progress Notes (Signed)
Patient has 9 documented "pressure injuries" due to location and clustering of lesions, they appear to be more related to moisture versus pressure. Discussed and reviewed with WOC RN and her recommendation was to put clear zinc oxide on lesions to heal them.

## 2017-12-21 NOTE — Progress Notes (Signed)
Inpatient Diabetes Program Recommendations  AACE/ADA: New Consensus Statement on Inpatient Glycemic Control (2015)  Target Ranges:  Prepandial:   less than 140 mg/dL      Peak postprandial:   less than 180 mg/dL (1-2 hours)      Critically ill patients:  140 - 180 mg/dL   Lab Results  Component Value Date   GLUCAP 141 (H) 12/21/2017   HGBA1C 11.0 (H) 12/20/2017    Review of Glycemic ControlResults for Luis [name], Luis "TIM" (MRN [id number]) as of 12/21/2017 09:31  Ref. Range 12/20/2017 23:59 12/21/2017 01:04 12/21/2017 02:20 12/21/2017 03:45 12/21/2017 05:09  Glucose-Capillary Latest Ref Range: 65 - 99 mg/dL 832 (H) 549 (H) 826 (H) 183 (H) 141 (H)    Diabetes history: Type 2 DM Outpatient Diabetes medications:  Trulicity 1.5 mg once a week Current orders for Inpatient glycemic control:  IV insulin  Inpatient Diabetes Program Recommendations:   Note that patient was on GLP-1 Trulicity once a week prior to admit.  A1C in January was 7.0% and now is 11%. It appears that patient may need insulin at this point.  Consider adding Lantus 25 units daily 2 hours prior to d/c of insulin drip.  Will follow.   Thanks,  Beryl Meager, RN, BC-ADM Inpatient Diabetes Coordinator Pager [phone] (8a-5p)

## 2017-12-21 NOTE — Progress Notes (Signed)
PROGRESS NOTE    Luis [name]  XBJ:[id number] DOB: Jun 03, 1966 DOA: 12/20/2017 PCP: Helane Rima, DO   Outpatient Specialists:     Brief Narrative:  Luis [name] is a 52 y.o. male with PMH of HTN, CHF, Obesity, DM presented with progressive generalized tiredness, fatigue for 1-2 weeks. He states that he has developed polydipsia, polyuria and diarrhea associated with weakness and tiredness. He had dry mouth. No vomiting. He reports 1-2 episodes of non bloody stools per day. He reports dizziness, lightheadedness, no fevers, no acute shortness of breath, no chest pains, no focal neuro weakness or paresthesias.  He states that he has been taking trulicity but does not check his glucose at home. In the ED: found to be in DKA. Received 1L NS, started on IV insulin. hospitalist is called for admission    Assessment & Plan:   Active Problems:   Essential hypertension   Diabetes mellitus type 2 in obese (HCC)   Diarrhea   DKA (diabetic ketoacidoses) ([facility])   Pressure injury of skin   HHS due to uncontrolled DM - hyperglycemia, polydipsia, polyuria. -transition to Sq lantus -HgbA1c went from 7 one month ago to 11-- says he only missed 1 dose of his trulicity -diabetic coordinator consult for further recommendations -SSI   AKI -due to dehydration -monitor labs -hold nephrotoxic medicaitons  H/o CHF -Echo (2017): lvef-35-40%. Then improved in 2018: LVEF 50-55%. No s/s of acute fluid overload on exam, but dehydrated. Will hold torsemide. Resume if needed. Cont BB, aspirin. Will close monitor to avoid fluid overload. I/o, daily weight   HTN. BP is stable. Cont BB, hold diuretic and losartan due to renal issue today   Hypernatremia -encourage PO water intake -CMP in AM  Multiple moisture associated wounds -wound care per nursing  Obesity Body mass index is 41.91 kg/m.  DVT prophylaxis:  Lovenox  Code Status: Full Code   Family Communication:   Disposition Plan:      Consultants:      Subjective: Only missed 1 dose of medication (trulicity)  Objective: Vitals:   12/21/17 0500 12/21/17 0600 12/21/17 0700 12/21/17 0800  BP:   (!) 156/95 (!) 162/94  Pulse: 91 87 91 (!) 101  Resp: (!) 29 (!) 34 (!) 32 20  Temp:    98.3 F (36.8 C)  TempSrc:    Oral  SpO2: 97% 95% 95% 98%  Weight:      Height:        Intake/Output Summary (Last 24 hours) at 12/21/2017 1219 Last data filed at 12/21/2017 0800 Gross per 24 hour  Intake 2928.33 ml  Output 700 ml  Net 2228.33 ml   Filed Weights   12/20/17 1735  Weight: (!) 152.1 kg (335 lb 5.1 oz)    Examination:  General exam: Appears calm and comfortable  Respiratory system: Clear to auscultation. Respiratory effort normal. Cardiovascular system: S1 & S2 heard, RRR. No JVD, murmurs, rubs, gallops or clicks. No pedal edema. Gastrointestinal system: Abdomen is nondistended, soft and nontender. No organomegaly or masses felt. Normal bowel sounds heard. Central nervous system: Alert and oriented. No focal neurological deficits. Extremities: Symmetric 5 x 5 power. Skin: few gluteal patches covering ulcers Psychiatry: Judgement and insight appear normal. Mood & affect appropriate.     Data Reviewed: I have personally reviewed following labs and imaging studies  CBC: Recent Labs  Lab 12/20/17 1210 12/20/17 1411  WBC 12.0*  --   HGB 16.6 18.4*  HCT 50.1 54.0*  MCV 97.3  --  PLT 287  --    Basic Metabolic Panel: Recent Labs  Lab 12/20/17 1339 12/20/17 1411 12/20/17 1822 12/20/17 2352 12/21/17 0339 12/21/17 0755  NA 135 138 141 148* 147* 146*  K 5.1 6.5* 4.7 4.0 4.0 4.6  CL 104 105 108 117* 116* 122*  CO2 13*  --  16* 21* 21* 14*  GLUCOSE 781* >700* 544* 236* 194* 153*  BUN 30* 39* 29* 24* 23* 21*  CREATININE 1.68* 1.30* 1.57* 1.42* 1.33* 1.16  CALCIUM 8.2*  --  9.1 9.3 9.2 9.1   GFR: Estimated Creatinine Clearance: 121.5 mL/min (by C-G formula based on SCr of 1.16  mg/dL). Liver Function Tests: Recent Labs  Lab 12/20/17 1356  AST 15  ALT 16*  ALKPHOS 183*  BILITOT 1.2  PROT 8.2*  ALBUMIN 3.5   Recent Labs  Lab 12/20/17 1356  LIPASE 24   No results for input(s): AMMONIA in the last 168 hours. Coagulation Profile: No results for input(s): INR, PROTIME in the last 168 hours. Cardiac Enzymes: No results for input(s): CKTOTAL, CKMB, CKMBINDEX, TROPONINI in the last 168 hours. BNP (last 3 results) Recent Labs    06/11/17 0916 11/19/17 0941  PROBNP 167.0* 75.0   HbA1C: Recent Labs    12/20/17 1211  HGBA1C 11.0*   CBG: Recent Labs  Lab 12/21/17 0345 12/21/17 0509 12/21/17 0627 12/21/17 0942 12/21/17 1036  GLUCAP 183* 141* 138* 144* 146*   Lipid Profile: No results for input(s): CHOL, HDL, LDLCALC, TRIG, CHOLHDL, LDLDIRECT in the last 72 hours. Thyroid Function Tests: No results for input(s): TSH, T4TOTAL, FREET4, T3FREE, THYROIDAB in the last 72 hours. Anemia Panel: No results for input(s): VITAMINB12, FOLATE, FERRITIN, TIBC, IRON, RETICCTPCT in the last 72 hours. Urine analysis:    Component Value Date/Time   COLORURINE STRAW (A) 12/20/2017 1150   APPEARANCEUR CLEAR 12/20/2017 1150   LABSPEC 1.028 12/20/2017 1150   PHURINE 5.0 12/20/2017 1150   GLUCOSEU >=500 (A) 12/20/2017 1150   HGBUR SMALL (A) 12/20/2017 1150   BILIRUBINUR NEGATIVE 12/20/2017 1150   KETONESUR 80 (A) 12/20/2017 1150   PROTEINUR NEGATIVE 12/20/2017 1150   NITRITE NEGATIVE 12/20/2017 1150   LEUKOCYTESUR NEGATIVE 12/20/2017 1150      Recent Results (from the past 240 hour(s))  MRSA PCR Screening     Status: None   Collection Time: 12/20/17  5:37 PM  Result Value Ref Range Status   MRSA by PCR NEGATIVE NEGATIVE Final    Comment:        The GeneXpert MRSA Assay (FDA approved for NASAL specimens only), is one component of a comprehensive MRSA colonization surveillance program. It is not intended to diagnose MRSA infection nor to guide  or monitor treatment for MRSA infections. Performed at [facility], [address], [place], [place] [address]       Anti-infectives (From admission, onward)   None       Radiology Studies: Dg Chest Portable 1 View  Result Date: 12/20/2017 CLINICAL DATA:  Weakness EXAM: PORTABLE CHEST 1 VIEW COMPARISON:  11/19/2017 FINDINGS: 1410 hours. The lungs are clear without focal pneumonia, edema, pneumothorax or pleural effusion. The cardiopericardial silhouette is within normal limits for size. The visualized bony structures of the thorax are intact. IMPRESSION: No active disease. Electronically Signed   By: Kennith Center M.D.   On: 12/20/2017 14:40        Scheduled Meds: . aspirin EC  81 mg Oral Daily  . enoxaparin (LOVENOX) injection  40 mg Subcutaneous  Q24H  . insulin glargine  25 Units Subcutaneous Daily  . metoprolol tartrate  37.5 mg Oral BID  . simvastatin  20 mg Oral QHS   Continuous Infusions:   LOS: 1 day    Time spent: 35 min    Joseph Art, DO [facility] Pager [phone]  If 7PM-7AM, please contact night-coverage [email or web address] Password [facility] 12/21/2017, 12:19 PM

## 2017-12-22 ENCOUNTER — Inpatient Hospital Stay (HOSPITAL_COMMUNITY): Payer: BLUE CROSS/BLUE SHIELD

## 2017-12-22 LAB — BASIC METABOLIC PANEL
ANION GAP: 11 (ref 5–15)
BUN: 18 mg/dL (ref 6–20)
CHLORIDE: 108 mmol/L (ref 101–111)
CO2: 19 mmol/L — ABNORMAL LOW (ref 22–32)
Calcium: 8.5 mg/dL — ABNORMAL LOW (ref 8.9–10.3)
Creatinine, Ser: 1.48 mg/dL — ABNORMAL HIGH (ref 0.61–1.24)
GFR calc Af Amer: >60 mL/min (ref >60)
GFR, EST NON AFRICAN AMERICAN: 54 mL/min — AB (ref >60)
Glucose, Bld: 388 mg/dL — ABNORMAL HIGH (ref 65–99)
POTASSIUM: 3.7 mmol/L (ref 3.5–5.1)
SODIUM: 138 mmol/L (ref 135–145)

## 2017-12-22 LAB — CBC WITH DIFFERENTIAL/PLATELET
BASOS ABS: 0 10*3/uL (ref 0.0–0.1)
BASOS PCT: 0 %
Eosinophils Absolute: 0 10*3/uL (ref 0.0–0.7)
Eosinophils Relative: 0 %
HCT: 46.7 % (ref 39.0–52.0)
HEMOGLOBIN: 15.7 g/dL (ref 13.0–17.0)
LYMPHS ABS: 3 10*3/uL (ref 0.7–4.0)
Lymphocytes Relative: 17 %
MCH: 31.9 pg (ref 26.0–34.0)
MCHC: 33.6 g/dL (ref 30.0–36.0)
MCV: 94.9 fL (ref 78.0–100.0)
Monocytes Absolute: 1.6 10*3/uL — ABNORMAL HIGH (ref 0.1–1.0)
Monocytes Relative: 9 %
NEUTROS PCT: 74 %
Neutro Abs: 13.6 10*3/uL — ABNORMAL HIGH (ref 1.7–7.7)
PLATELETS: 218 10*3/uL (ref 150–400)
RBC: 4.92 MIL/uL (ref 4.22–5.81)
RDW: 14.1 % (ref 11.5–15.5)
WBC: 18.3 10*3/uL — AB (ref 4.0–10.5)

## 2017-12-22 LAB — COMPREHENSIVE METABOLIC PANEL
ALBUMIN: 3.4 g/dL — AB (ref 3.5–5.0)
ALK PHOS: 146 U/L — AB (ref 38–126)
ALT: 15 U/L — AB (ref 17–63)
AST: 18 U/L (ref 15–41)
Anion gap: 10 (ref 5–15)
BUN: 22 mg/dL — AB (ref 6–20)
CALCIUM: 8.7 mg/dL — AB (ref 8.9–10.3)
CO2: 20 mmol/L — AB (ref 22–32)
Chloride: 107 mmol/L (ref 101–111)
Creatinine, Ser: 1.53 mg/dL — ABNORMAL HIGH (ref 0.61–1.24)
GFR calc non Af Amer: 52 mL/min — ABNORMAL LOW (ref >60)
GFR, EST AFRICAN AMERICAN: 60 mL/min — AB (ref >60)
GLUCOSE: 333 mg/dL — AB (ref 65–99)
Potassium: 3.8 mmol/L (ref 3.5–5.1)
Sodium: 137 mmol/L (ref 135–145)
Total Bilirubin: 0.8 mg/dL (ref 0.3–1.2)
Total Protein: 8.2 g/dL — ABNORMAL HIGH (ref 6.5–8.1)

## 2017-12-22 LAB — LACTIC ACID, PLASMA: Lactic Acid, Venous: 3.1 mmol/L (ref 0.5–1.9)

## 2017-12-22 LAB — CBC
HCT: 46.1 % (ref 39.0–52.0)
HEMOGLOBIN: 15.3 g/dL (ref 13.0–17.0)
MCH: 31.7 pg (ref 26.0–34.0)
MCHC: 33.2 g/dL (ref 30.0–36.0)
MCV: 95.6 fL (ref 78.0–100.0)
PLATELETS: 207 10*3/uL (ref 150–400)
RBC: 4.82 MIL/uL (ref 4.22–5.81)
RDW: 14 % (ref 11.5–15.5)
WBC: 14.8 10*3/uL — AB (ref 4.0–10.5)

## 2017-12-22 LAB — GLUCOSE, CAPILLARY
GLUCOSE-CAPILLARY: 339 mg/dL — AB (ref 65–99)
GLUCOSE-CAPILLARY: 340 mg/dL — AB (ref 65–99)
GLUCOSE-CAPILLARY: 370 mg/dL — AB (ref 65–99)
GLUCOSE-CAPILLARY: 380 mg/dL — AB (ref 65–99)
GLUCOSE-CAPILLARY: 385 mg/dL — AB (ref 65–99)
GLUCOSE-CAPILLARY: 428 mg/dL — AB (ref 65–99)
Glucose-Capillary: 350 mg/dL — ABNORMAL HIGH (ref 65–99)
Glucose-Capillary: 311 mg/dL — ABNORMAL HIGH (ref 65–99)

## 2017-12-22 LAB — INFLUENZA PANEL BY PCR (TYPE A & B)
INFLAPCR: NEGATIVE
Influenza B By PCR: NEGATIVE

## 2017-12-22 MED ORDER — ACETAMINOPHEN 500 MG PO TABS
1000.0000 mg | ORAL_TABLET | Freq: Once | ORAL | Status: AC
  Administered 2017-12-22: 1000 mg via ORAL
  Filled 2017-12-22: qty 2

## 2017-12-22 MED ORDER — SODIUM CHLORIDE 0.9 % IV SOLN
INTRAVENOUS | Status: DC
  Administered 2017-12-23 (×2): via INTRAVENOUS

## 2017-12-22 MED ORDER — FENTANYL CITRATE (PF) 100 MCG/2ML IJ SOLN
50.0000 ug | INTRAMUSCULAR | Status: DC | PRN
  Filled 2017-12-22: qty 2

## 2017-12-22 MED ORDER — PIPERACILLIN-TAZOBACTAM 3.375 G IVPB 30 MIN
3.3750 g | Freq: Once | INTRAVENOUS | Status: AC
  Administered 2017-12-23: 3.375 g via INTRAVENOUS
  Filled 2017-12-22: qty 50

## 2017-12-22 MED ORDER — INSULIN ASPART 100 UNIT/ML ~~LOC~~ SOLN: 6.0000 [IU] | Freq: Three times a day (TID) | SUBCUTANEOUS | Status: DC

## 2017-12-22 MED ORDER — VANCOMYCIN HCL 10 G IV SOLR
2500.0000 mg | Freq: Once | INTRAVENOUS | Status: AC
  Administered 2017-12-23: 2500 mg via INTRAVENOUS
  Filled 2017-12-22: qty 2000

## 2017-12-22 MED ORDER — DIPHENHYDRAMINE HCL 25 MG PO CAPS
25.0000 mg | ORAL_CAPSULE | Freq: Four times a day (QID) | ORAL | Status: DC | PRN
  Administered 2017-12-23: 25 mg via ORAL
  Filled 2017-12-22: qty 1

## 2017-12-22 MED ORDER — INSULIN ASPART 100 UNIT/ML ~~LOC~~ SOLN
10.0000 [IU] | Freq: Three times a day (TID) | SUBCUTANEOUS | Status: DC
  Administered 2017-12-22 – 2017-12-24 (×6): 10 [IU] via SUBCUTANEOUS

## 2017-12-22 MED ORDER — GERHARDT'S BUTT CREAM
TOPICAL_CREAM | Freq: Four times a day (QID) | CUTANEOUS | Status: DC
  Administered 2017-12-22: 1 via TOPICAL
  Administered 2017-12-23 – 2017-12-24 (×6): via TOPICAL
  Administered 2017-12-24: 1 via TOPICAL
  Administered 2017-12-24 – 2017-12-25 (×5): via TOPICAL
  Administered 2017-12-26: 1 via TOPICAL
  Filled 2017-12-22 (×2): qty 1

## 2017-12-22 MED ORDER — ENOXAPARIN SODIUM 80 MG/0.8ML ~~LOC~~ SOLN
80.0000 mg | SUBCUTANEOUS | Status: DC
  Administered 2017-12-22 – 2017-12-25 (×4): 80 mg via SUBCUTANEOUS
  Filled 2017-12-22 (×5): qty 0.8

## 2017-12-22 MED ORDER — OXYCODONE HCL 5 MG PO TABS
5.0000 mg | ORAL_TABLET | ORAL | Status: DC | PRN
  Administered 2017-12-22: 5 mg via ORAL
  Filled 2017-12-22 (×2): qty 1

## 2017-12-22 MED ORDER — SODIUM CHLORIDE 0.9 % IV BOLUS (SEPSIS): 1000.0000 mL | Freq: Once | INTRAVENOUS | Status: DC

## 2017-12-22 MED ORDER — SODIUM CHLORIDE 0.9 % IV BOLUS (SEPSIS): 500.0000 mL | Freq: Once | INTRAVENOUS | Status: DC

## 2017-12-22 MED ORDER — INSULIN GLARGINE 100 UNIT/ML ~~LOC~~ SOLN
30.0000 [IU] | Freq: Two times a day (BID) | SUBCUTANEOUS | Status: DC
  Administered 2017-12-22 – 2017-12-26 (×8): 30 [IU] via SUBCUTANEOUS
  Filled 2017-12-22 (×8): qty 0.3

## 2017-12-22 MED ORDER — METOPROLOL TARTRATE 50 MG PO TABS
50.0000 mg | ORAL_TABLET | Freq: Two times a day (BID) | ORAL | Status: DC
  Administered 2017-12-22 – 2017-12-25 (×6): 50 mg via ORAL
  Filled 2017-12-22 (×6): qty 1

## 2017-12-22 NOTE — Progress Notes (Signed)
CRITICAL VALUE ALERT  Critical Value: Lactic Acid: 3.1  Date & Time Notied:  2/19  2310  Provider Notified: Craige Cotta  Orders Received/Actions taken: Waiting on orders

## 2017-12-22 NOTE — Progress Notes (Signed)
PROGRESS NOTE    Luis [name]  ZOX:[id number] DOB: 10-23-1966 DOA: 12/20/2017 PCP: Helane Rima, DO   Outpatient Specialists:     Brief Narrative:  Luis [name] is a 52 y.o. male with PMH of HTN, CHF, Obesity, DM presented with progressive generalized tiredness, fatigue for 1-2 weeks. He states that he has developed polydipsia, polyuria and diarrhea associated with weakness and tiredness. He had dry mouth. No vomiting. He reports 1-2 episodes of non bloody stools per day. He reports dizziness, lightheadedness, no fevers, no acute shortness of breath, no chest pains, no focal neuro weakness or paresthesias.  He states that he has been taking trulicity but does not check his glucose at home. In the ED: found to be in DKA. Received 1L NS, started on IV insulin. hospitalist is called for admission    Assessment & Plan:   Active Problems:   Essential hypertension   Diabetes mellitus type 2 in obese (HCC)   Diarrhea   DKA (diabetic ketoacidoses) (HCC)   Pressure injury of skin   HHS/DKA due to uncontrolled DM - hyperglycemia, polydipsia, polyuria. -transition to Sq lantus BID--- not sure trulicity is working well for patient -HgbA1c went from 7 one month ago to 53-- says he only missed 1 dose of his trulicity -diabetic coordinator consult for further recommendations -SSI + novolog  Fever -no focal area of infection such as cough/pain etc -blood cultures pending -hold on abx for now  AKI- baseline around 1.1 -due to dehydration? -monitor labs -hold nephrotoxic medicaitons  H/o CHF -Echo (2017): lvef-35-40%. Then improved in 2018: LVEF 50-55% -monitor closely  HTN.  -resume meds as able  Hypernatremia -encourage PO water intake  Multiple moisture associated wounds -wound care per nursing  Obesity Body mass index is 41.91 kg/m.  DVT prophylaxis:  Lovenox  Code Status: Full Code   Family Communication:   Disposition Plan:     Consultants:       Subjective: Had temp of 100.5 yesterday-- no focal symptoms-- no SOB, no CP  Objective: Vitals:   12/22/17 0143 12/22/17 0550 12/22/17 0731 12/22/17 0942  BP: 119/66 109/78 (!) 147/73 125/76  Pulse: 91 (!) 113 (!) 108 (!) 115  Resp: (!) 22 20 20 18   Temp: 99.3 F (37.4 C) 99 F (37.2 C) 99.4 F (37.4 C) 99.1 F (37.3 C)  TempSrc: Oral Oral Oral Oral  SpO2: 94% 95% 99% 97%  Weight:      Height:        Intake/Output Summary (Last 24 hours) at 12/22/2017 1330 Last data filed at 12/22/2017 1000 Gross per 24 hour  Intake 1107.57 ml  Output 1125 ml  Net -17.43 ml   Filed Weights   12/20/17 1735  Weight: (!) 152.1 kg (335 lb 5.1 oz)    Examination:  General exam: in bed, NAD Respiratory system: no wheezing, no increased work of breathing Cardiovascular system: rrr Gastrointestinal system: Abdomen is nondistended, soft and nontender. No organomegaly or masses felt. Normal bowel sounds heard. Central nervous system: Alert and oriented. No focal neurological deficits. Psychiatry: poor insight    Data Reviewed: I have personally reviewed following labs and imaging studies  CBC: Recent Labs  Lab 12/20/17 1210 12/20/17 1411 12/22/17 0656  WBC 12.0*  --  14.8*  HGB 16.6 18.4* 15.3  HCT 50.1 54.0* 46.1  MCV 97.3  --  95.6  PLT 287  --  207   Basic Metabolic Panel: Recent Labs  Lab 12/20/17 1822 12/20/17 2352 12/21/17 0339 12/21/17 0755  12/22/17 0656  NA 141 148* 147* 146* 138  K 4.7 4.0 4.0 4.6 3.7  CL 108 117* 116* 122* 108  CO2 16* 21* 21* 14* 19*  GLUCOSE 544* 236* 194* 153* 388*  BUN 29* 24* 23* 21* 18  CREATININE 1.57* 1.42* 1.33* 1.16 1.48*  CALCIUM 9.1 9.3 9.2 9.1 8.5*   GFR: Estimated Creatinine Clearance: 95.2 mL/min (A) (by C-G formula based on SCr of 1.48 mg/dL (H)). Liver Function Tests: Recent Labs  Lab 12/20/17 1356  AST 15  ALT 16*  ALKPHOS 183*  BILITOT 1.2  PROT 8.2*  ALBUMIN 3.5   Recent Labs  Lab 12/20/17 1356   LIPASE 24   No results for input(s): AMMONIA in the last 168 hours. Coagulation Profile: No results for input(s): INR, PROTIME in the last 168 hours. Cardiac Enzymes: No results for input(s): CKTOTAL, CKMB, CKMBINDEX, TROPONINI in the last 168 hours. BNP (last 3 results) Recent Labs    06/11/17 0916 11/19/17 0941  PROBNP 167.0* 75.0   HbA1C: Recent Labs    12/20/17 1211  HGBA1C 11.0*   CBG: Recent Labs  Lab 12/22/17 0034 12/22/17 0306 12/22/17 0603 12/22/17 0823 12/22/17 1152  GLUCAP 339* 340* 370* 380* 385*   Lipid Profile: No results for input(s): CHOL, HDL, LDLCALC, TRIG, CHOLHDL, LDLDIRECT in the last 72 hours. Thyroid Function Tests: No results for input(s): TSH, T4TOTAL, FREET4, T3FREE, THYROIDAB in the last 72 hours. Anemia Panel: No results for input(s): VITAMINB12, FOLATE, FERRITIN, TIBC, IRON, RETICCTPCT in the last 72 hours. Urine analysis:    Component Value Date/Time   COLORURINE STRAW (A) 12/20/2017 1150   APPEARANCEUR CLEAR 12/20/2017 1150   LABSPEC 1.028 12/20/2017 1150   PHURINE 5.0 12/20/2017 1150   GLUCOSEU >=500 (A) 12/20/2017 1150   HGBUR SMALL (A) 12/20/2017 1150   BILIRUBINUR NEGATIVE 12/20/2017 1150   KETONESUR 80 (A) 12/20/2017 1150   PROTEINUR NEGATIVE 12/20/2017 1150   NITRITE NEGATIVE 12/20/2017 1150   LEUKOCYTESUR NEGATIVE 12/20/2017 1150      Recent Results (from the past 240 hour(s))  MRSA PCR Screening     Status: None   Collection Time: 12/20/17  5:37 PM  Result Value Ref Range Status   MRSA by PCR NEGATIVE NEGATIVE Final    Comment:        The GeneXpert MRSA Assay (FDA approved for NASAL specimens only), is one component of a comprehensive MRSA colonization surveillance program. It is not intended to diagnose MRSA infection nor to guide or monitor treatment for MRSA infections. Performed at [facility], [address] [place], [place] [address]       Anti-infectives (From admission,  onward)   None       Radiology Studies: Dg Chest Portable 1 View  Result Date: 12/20/2017 CLINICAL DATA:  Weakness EXAM: PORTABLE CHEST 1 VIEW COMPARISON:  11/19/2017 FINDINGS: 1410 hours. The lungs are clear without focal pneumonia, edema, pneumothorax or pleural effusion. The cardiopericardial silhouette is within normal limits for size. The visualized bony structures of the thorax are intact. IMPRESSION: No active disease. Electronically Signed   By: Kennith Center M.D.   On: 12/20/2017 14:40        Scheduled Meds: . aspirin EC  81 mg Oral Daily  . enoxaparin (LOVENOX) injection  40 mg Subcutaneous Q24H  . insulin aspart  0-20 Units Subcutaneous TID WC  . insulin aspart  0-5 Units Subcutaneous QHS  . insulin aspart  10 Units Subcutaneous TID WC  . insulin glargine  30 Units Subcutaneous BID  . metoprolol tartrate  50 mg Oral BID  . simvastatin  20 mg Oral QHS   Continuous Infusions:   LOS: 2 days    Time spent: 35 min    Joseph Art, DO [facility] Pager [phone]  If 7PM-7AM, please contact night-coverage [email or web address] Password [email or web address] 12/22/2017, 1:30 PM

## 2017-12-22 NOTE — Consult Note (Signed)
WOC Nurse wound consult note Reason for Consult: moisture associated skin damage, specifically intertriginous dermatitis to the gluteal cleft and buttocks (right) Wound type:moisture Pressure Injury POA: NA Measurement: intragluteal cleft, right gluteal crease Wound NTZ:GYFV, moist Drainage (amount, consistency, odor) scant serous Periwound:intact, diaphoretic Dressing procedure/placement/frequency: I will provide guidance for topical care using Gerhart's Butt Cream 4 times daily (Q6H) and provide a pressure redistribution chair pad for OOB use.  Our house wicking textile will be used to remove excess moisture from the gluteal cleft.  WOC nursing team will not follow, but will remain available to this patient, the nursing and medical teams.  Please re-consult if needed. Thanks, Ladona Mow, MSN, RN, GNP, [place]  Pager# [phone]

## 2017-12-22 NOTE — Progress Notes (Addendum)
RN paged earlier because pt's temp was 125f. BP 90s. AL, CXR, flu, CBC with diff, CMP ordered. UA was neg on 2/17. Bld cx x 2 done on 2/19. No IVF infusing, so 1L NS bolus ordered.  Results:  1. Sepsis-LA 3.1 with CXR probable infiltrate and known wound with ? Infection plus fever, tachycardia, and soft BP. Start Vanc and Zosyn empirically for ? Wound infection and probable PNA. Await blood cx results and narrow abx as able.  Flu pending. CXR-as above. CBc with diff with higher WBCC and neut #.  CMP with higher creat.  Other plan: 1L plus .5L bolus. R/p LA x 2. IVF at 100cc/hr. Await flu.  Wound care following. KJKG, NP [facility] Addendum: DKA is resolved. Sugar up some. SSI. Gap remains closed and CO2 is 20. Continue IVF. K normal.  KJKG, NP [facility] Addendum: shortly after above, RN paged that pt had a skin area in the left groin that was red and malodorous. NP to bedside.  S: in pain from groin. No CP. No SOB. O: morbidly obese AAM in NAD. BP 100s. HR 100s. RR 20. SaO2 normal. Left groin: tender upon palpation of left groin, upper thigh and scrotum. No firmness of skin in this area. No signs of abscess obvious. Area is pink and is malodorous. There is no drainage. Moisture present and skin is excoriated in the thigh fold at groin.  A/P: 1. Skin wound-Do not see where this is the area sited earlier by wound care. No obvious abscess. This is likely the reason for the sepsis. Plan discussed with pt. Told pt he would not leave tomorrow. Rest of plan as above.  Total critical care time:30 minutes Critical care time was exclusive of separately billable procedures and treating other patients. Critical care was necessary to treat or prevent imminent or life-threatening deterioration. Critical care was time spent personally by me on the following activities: development of treatment plan with patient and/or surrogate as well as nursing, discussions with consultants, evaluation of patient's response to  treatment, examination of patient, obtaining history from patient or surrogate, ordering and performing treatments and interventions, ordering and review of laboratory studies, ordering and review of radiographic studies, pulse oximetry and re-evaluation of patient's condition. KJKG, NP [facility] Addendum: LA down to 1.9. Flu negative.  KJKG, NP [facility]

## 2017-12-22 NOTE — Progress Notes (Signed)
Pharmacy Antibiotic Note  Luis [name] is a 52 y.o. male admitted on 12/20/2017 with sepsis.  Pharmacy has been consulted for zosyn and vancomycin dosing.  Plan: Zosyn 3.375g IV q8h (4 hour infusion).  Vancomycin 2500 mg x1 then 750 mg IV q8h for est AUC=501 Goal AUC=400-500 F/u scr/cultures/levels  Height: 6\' 3"  (190.5 cm) Weight: (!) 335 lb 5.1 oz (152.1 kg) IBW/kg (Calculated) : 84.5  Temp (24hrs), Avg:100 F (37.8 C), Min:99 F (37.2 C), Max:103.2 F (39.6 C)  Recent Labs  Lab 12/20/17 1210  12/20/17 2352 12/21/17 0339 12/21/17 0755 12/22/17 0656 12/22/17 2152  WBC 12.0*  --   --   --   --  14.8* 18.3*  CREATININE  --    < > 1.42* 1.33* 1.16 1.48* 1.53*  LATICACIDVEN  --   --   --   --   --   --  3.1*   < > = values in this interval not displayed.    Estimated Creatinine Clearance: 92.1 mL/min (A) (by C-G formula based on SCr of 1.53 mg/dL (H)).    Allergies  Allergen Reactions  . Demerol [Meperidine] Itching  . Morphine And Related Itching    Antimicrobials this admission: 2/19 zosyn >>  2/19 vancomycin >>   Dose adjustments this admission:   Microbiology results:  BCx:   UCx:    Sputum:    MRSA PCR:   Thank you for allowing pharmacy to be a part of this patient's care.  Lorenza Evangelist 12/22/2017 11:46 PM

## 2017-12-22 NOTE — Progress Notes (Signed)
Inpatient Diabetes Program Recommendations  AACE/ADA: New Consensus Statement on Inpatient Glycemic Control (2015)  Target Ranges:  Prepandial:   less than 140 mg/dL      Peak postprandial:   less than 180 mg/dL (1-2 hours)      Critically ill patients:  140 - 180 mg/dL   Lab Results  Component Value Date   GLUCAP 380 (H) 12/22/2017   HGBA1C 11.0 (H) 12/20/2017    Review of Glycemic Control  Spoke with pt at length regarding his glycemic control and HgbA1C of 11%. Pt states he has appt with PCP on Thursday, February 21st, for diabetes management. Pt states he shot the Trulicity "up in the air" and was unable to take the injection.?? Discussed importance of keeping blood sugars in control to prevent long-term complications. Does not check blood sugars at home. "I used to, I'm sure I can find the meter I had gotten." Pt willing to go home on insulin.   Recommendations:  Increase Lantus to 30 units bid Increase Novolog to 10 unts tidwc. Needs prescription for glucose meter and supplies.  Instructed pt to check blood sugars 4x/day and take logbook to MD appt.  Thank you. Ailene Ards, RD, LDN, CDE Inpatient Diabetes Coordinator [phone]

## 2017-12-23 ENCOUNTER — Encounter (HOSPITAL_COMMUNITY): Payer: Self-pay | Admitting: Radiology

## 2017-12-23 ENCOUNTER — Inpatient Hospital Stay (HOSPITAL_COMMUNITY): Payer: BLUE CROSS/BLUE SHIELD

## 2017-12-23 ENCOUNTER — Telehealth: Payer: Self-pay | Admitting: Family Medicine

## 2017-12-23 DIAGNOSIS — R509 Fever, unspecified: Secondary | ICD-10-CM

## 2017-12-23 DIAGNOSIS — L0390 Cellulitis, unspecified: Secondary | ICD-10-CM

## 2017-12-23 LAB — GLUCOSE, CAPILLARY
GLUCOSE-CAPILLARY: 300 mg/dL — AB (ref 65–99)
GLUCOSE-CAPILLARY: 295 mg/dL — AB (ref 65–99)
GLUCOSE-CAPILLARY: 247 mg/dL — AB (ref 65–99)
GLUCOSE-CAPILLARY: 177 mg/dL — AB (ref 65–99)
Glucose-Capillary: 295 mg/dL — ABNORMAL HIGH (ref 65–99)

## 2017-12-23 LAB — CREATININE, SERUM
Creatinine, Ser: 1.68 mg/dL — ABNORMAL HIGH (ref 0.61–1.24)
GFR calc non Af Amer: 46 mL/min — ABNORMAL LOW (ref >60)
GFR, EST AFRICAN AMERICAN: 54 mL/min — AB (ref >60)

## 2017-12-23 LAB — LACTIC ACID, PLASMA: Lactic Acid, Venous: 1.6 mmol/L (ref 0.5–1.9)

## 2017-12-23 MED ORDER — SODIUM CHLORIDE 0.9 % IV SOLN
2000.0000 mg | INTRAVENOUS | Status: DC
  Administered 2017-12-24: 2000 mg via INTRAVENOUS
  Filled 2017-12-23: qty 2000

## 2017-12-23 MED ORDER — VANCOMYCIN HCL IN DEXTROSE 750-5 MG/150ML-% IV SOLN
750.0000 mg | Freq: Three times a day (TID) | INTRAVENOUS | Status: DC
  Filled 2017-12-23: qty 150

## 2017-12-23 MED ORDER — PIPERACILLIN-TAZOBACTAM 3.375 G IVPB
3.3750 g | Freq: Three times a day (TID) | INTRAVENOUS | Status: DC
  Administered 2017-12-23 – 2017-12-24 (×4): 3.375 g via INTRAVENOUS
  Filled 2017-12-23 (×3): qty 50

## 2017-12-23 MED ORDER — IOPAMIDOL (ISOVUE-300) INJECTION 61%: 15.0000 mL | Freq: Once | INTRAVENOUS | Status: DC | PRN

## 2017-12-23 MED ORDER — ACETAMINOPHEN 325 MG PO TABS
650.0000 mg | ORAL_TABLET | Freq: Four times a day (QID) | ORAL | Status: DC | PRN
  Administered 2017-12-23 – 2017-12-25 (×4): 650 mg via ORAL
  Filled 2017-12-23 (×4): qty 2

## 2017-12-23 MED ORDER — IOPAMIDOL (ISOVUE-300) INJECTION 61%
INTRAVENOUS | Status: AC
  Administered 2017-12-23: 15 mL
  Filled 2017-12-23: qty 30

## 2017-12-23 NOTE — Progress Notes (Signed)
PROGRESS NOTE    Luis [name]  AOZ:[id number] DOB: August 21, 1966 DOA: 12/20/2017 PCP: Helane Rima, DO   Outpatient Specialists:     Brief Narrative:  Luis [name] is a 52 y.o. male with PMH of HTN, CHF, Obesity, DM presented with progressive generalized tiredness, fatigue for 1-2 weeks. He states that he has developed polydipsia, polyuria and diarrhea associated with weakness and tiredness. He had dry mouth. No vomiting. He reports 1-2 episodes of non bloody stools per day. He reports dizziness, lightheadedness, no fevers, no acute shortness of breath, no chest pains, no focal neuro weakness or paresthesias.  He states that he has been taking trulicity but does not check his glucose at home. In the ED: found to be in DKA. Once DKA treated, patient began to have fevers-- ? Source PNA (no cough) vs cellulitis of the groin.  CT scan of abd/pelvis pending   Assessment & Plan:   Active Problems:   Essential hypertension   Diabetes mellitus type 2 in obese (HCC)   Diarrhea   DKA (diabetic ketoacidoses) (HCC)   Pressure injury of skin   HHS/DKA due to uncontrolled DM - hyperglycemia, polydipsia, polyuria. -transition to Sq lantus BID -HgbA1c went from 7 one month ago to 11-- says he only missed 1 dose of his trulicity -diabetic coordinator consult for further recommendations -SSI + novolog and lantus while here in hospital  Fever -? PNA vs cellulitis -blood cultures pending -IV vanc/zosyn for now -CT scan of abd/pelvis pending  Leukocytosis with bandemia -see above for work up  AKI- baseline around 1.1 -due to dehydration? -monitor labs -hold nephrotoxic medications -IVF  H/o CHF -Echo (2017): lvef-35-40%. Then improved in 2018: LVEF 50-55% -monitor closely  HTN.  -resume meds as able  Hypernatremia -encourage PO water intake  Multiple moisture associated wounds -wound care CT scan of abd/pelvis pending  Obesity Body mass index is 41.91 kg/m.  DVT  prophylaxis:  Lovenox  Code Status: Full Code   Family Communication: Sister at bedside  Disposition Plan:     Consultants:      Subjective: Fever last PM again Pain in left groin/testicle  Objective: Vitals:   12/23/17 0554 12/23/17 0647 12/23/17 0732 12/23/17 1219  BP: 110/61   (!) 98/47  Pulse: (!) 108   92  Resp: 18   19  Temp: (!) 102.9 F (39.4 C) 98.5 F (36.9 C) 100 F (37.8 C) 99.8 F (37.7 C)  TempSrc: Oral Oral Oral Oral  SpO2: 100%   98%  Weight:      Height:        Intake/Output Summary (Last 24 hours) at 12/23/2017 1247 Last data filed at 12/23/2017 1219 Gross per 24 hour  Intake 1676.67 ml  Output 1200 ml  Net 476.67 ml   Filed Weights   12/20/17 1735  Weight: (!) 152.1 kg (335 lb 5.1 oz)    Examination:  General exam: in bed, obese Respiratory system: no wheezing, no increased work of breathing Cardiovascular system: rrr Gastrointestinal system: +BS, soft Central nervous system: A+Ox3 Skin: moist folds of skin with painful palpation in left groin into left testicle    Data Reviewed: I have personally reviewed following labs and imaging studies  CBC: Recent Labs  Lab 12/20/17 1210 12/20/17 1411 12/22/17 0656 12/22/17 2152  WBC 12.0*  --  14.8* 18.3*  NEUTROABS  --   --   --  13.6*  HGB 16.6 18.4* 15.3 15.7  HCT 50.1 54.0* 46.1 46.7  MCV 97.3  --  95.6 94.9  PLT 287  --  207 218   Basic Metabolic Panel: Recent Labs  Lab 12/20/17 2352 12/21/17 0339 12/21/17 0755 12/22/17 0656 12/22/17 2152 12/23/17 0705  NA 148* 147* 146* 138 137  --   K 4.0 4.0 4.6 3.7 3.8  --   CL 117* 116* 122* 108 107  --   CO2 21* 21* 14* 19* 20*  --   GLUCOSE 236* 194* 153* 388* 333*  --   BUN 24* 23* 21* 18 22*  --   CREATININE 1.42* 1.33* 1.16 1.48* 1.53* 1.68*  CALCIUM 9.3 9.2 9.1 8.5* 8.7*  --    GFR: Estimated Creatinine Clearance: 83.9 mL/min (A) (by C-G formula based on SCr of 1.68 mg/dL (H)). Liver Function Tests: Recent Labs    Lab 12/20/17 1356 12/22/17 2152  AST 15 18  ALT 16* 15*  ALKPHOS 183* 146*  BILITOT 1.2 0.8  PROT 8.2* 8.2*  ALBUMIN 3.5 3.4*   Recent Labs  Lab 12/20/17 1356  LIPASE 24   No results for input(s): AMMONIA in the last 168 hours. Coagulation Profile: No results for input(s): INR, PROTIME in the last 168 hours. Cardiac Enzymes: No results for input(s): CKTOTAL, CKMB, CKMBINDEX, TROPONINI in the last 168 hours. BNP (last 3 results) Recent Labs    06/11/17 0916 11/19/17 0941  PROBNP 167.0* 75.0   HbA1C: No results for input(s): HGBA1C in the last 72 hours. CBG: Recent Labs  Lab 12/22/17 1617 12/22/17 2152 12/23/17 0026 12/23/17 0721 12/23/17 1143  GLUCAP 350* 311* 295* 300* 295*   Lipid Profile: No results for input(s): CHOL, HDL, LDLCALC, TRIG, CHOLHDL, LDLDIRECT in the last 72 hours. Thyroid Function Tests: No results for input(s): TSH, T4TOTAL, FREET4, T3FREE, THYROIDAB in the last 72 hours. Anemia Panel: No results for input(s): VITAMINB12, FOLATE, FERRITIN, TIBC, IRON, RETICCTPCT in the last 72 hours. Urine analysis:    Component Value Date/Time   COLORURINE STRAW (A) 12/20/2017 1150   APPEARANCEUR CLEAR 12/20/2017 1150   LABSPEC 1.028 12/20/2017 1150   PHURINE 5.0 12/20/2017 1150   GLUCOSEU >=500 (A) 12/20/2017 1150   HGBUR SMALL (A) 12/20/2017 1150   BILIRUBINUR NEGATIVE 12/20/2017 1150   KETONESUR 80 (A) 12/20/2017 1150   PROTEINUR NEGATIVE 12/20/2017 1150   NITRITE NEGATIVE 12/20/2017 1150   LEUKOCYTESUR NEGATIVE 12/20/2017 1150      Recent Results (from the past 240 hour(s))  MRSA PCR Screening     Status: None   Collection Time: 12/20/17  5:37 PM  Result Value Ref Range Status   MRSA by PCR NEGATIVE NEGATIVE Final    Comment:        The GeneXpert MRSA Assay (FDA approved for NASAL specimens only), is one component of a comprehensive MRSA colonization surveillance program. It is not intended to diagnose MRSA infection nor to guide  or monitor treatment for MRSA infections. Performed at [facility], [address], [place], [place] [address]       Anti-infectives (From admission, onward)   Start     Dose/Rate Route Frequency Ordered Stop   12/24/17 0000  vancomycin (VANCOCIN) 2,000 mg in sodium chloride 0.9 % 500 mL IVPB     2,000 mg 250 mL/hr over 120 Minutes Intravenous Every 24 hours 12/23/17 0739     12/23/17 1200  vancomycin (VANCOCIN) IVPB 750 mg/150 ml premix  Status:  Discontinued     750 mg 150 mL/hr over 60 Minutes Intravenous Every 8 hours 12/23/17 0243 12/23/17 0735  12/23/17 0800  piperacillin-tazobactam (ZOSYN) IVPB 3.375 g     3.375 g 12.5 mL/hr over 240 Minutes Intravenous Every 8 hours 12/23/17 0243     12/22/17 2359  vancomycin (VANCOCIN) 2,500 mg in sodium chloride 0.9 % 500 mL IVPB     2,500 mg 250 mL/hr over 120 Minutes Intravenous  Once 12/22/17 2343 12/23/17 0430   12/22/17 2345  piperacillin-tazobactam (ZOSYN) IVPB 3.375 g     3.375 g 100 mL/hr over 30 Minutes Intravenous  Once 12/22/17 2343 12/23/17 1610       Radiology Studies: Dg Chest Port 1 View  Result Date: 12/22/2017 CLINICAL DATA:  Fever and shortness of breath EXAM: PORTABLE CHEST 1 VIEW COMPARISON:  12/20/2017, 11/19/2016 FINDINGS: Streaky atelectasis or minimal infiltrate at the left base. No pleural effusion. Right lung clear. Stable cardiomediastinal silhouette. No pneumothorax. IMPRESSION: Streaky atelectasis or minimal infiltrate at the left lung base. Electronically Signed   By: Jasmine Pang M.D.   On: 12/22/2017 22:03        Scheduled Meds: . aspirin EC  81 mg Oral Daily  . enoxaparin (LOVENOX) injection  80 mg Subcutaneous Q24H  . Gerhardt's butt cream   Topical QID  . insulin aspart  0-20 Units Subcutaneous TID WC  . insulin aspart  0-5 Units Subcutaneous QHS  . insulin aspart  10 Units Subcutaneous TID WC  . insulin glargine  30 Units Subcutaneous BID  . metoprolol tartrate  50 mg  Oral BID  . simvastatin  20 mg Oral QHS   Continuous Infusions: . sodium chloride 100 mL/hr at 12/23/17 0330  . piperacillin-tazobactam (ZOSYN)  IV Stopped (12/23/17 1211)  . sodium chloride Stopped (12/23/17 0329)  . sodium chloride Stopped (12/22/17 2350)  . [START ON 12/24/2017] vancomycin       LOS: 3 days    Time spent: 35 min    Joseph Art, DO [facility] Pager [phone]  If 7PM-7AM, please contact night-coverage [email or web address] Password [facility] 12/23/2017, 12:47 PM

## 2017-12-23 NOTE — Telephone Encounter (Signed)
See note

## 2017-12-23 NOTE — Progress Notes (Signed)
Pharmacy Antibiotic Note  Luis [name] is a 52 y.o. male admitted on 12/20/2017 with sepsis.  Pharmacy has been consulted for zosyn and vancomycin dosing.  Plan: Zosyn 3.375g IV q8h (4 hour infusion).  Will change vanc to 2g IV q24 dosing - goal AUC 400-500) F/u scr/cultures/levels  Height: 6\' 3"  (190.5 cm) Weight: (!) 335 lb 5.1 oz (152.1 kg) IBW/kg (Calculated) : 84.5  Temp (24hrs), Avg:100.3 F (37.9 C), Min:98.5 F (36.9 C), Max:103.2 F (39.6 C)  Recent Labs  Lab 12/20/17 1210  12/20/17 2352 12/21/17 0339 12/21/17 0755 12/22/17 0656 12/22/17 2152 12/22/17 2357  WBC 12.0*  --   --   --   --  14.8* 18.3*  --   CREATININE  --    < > 1.42* 1.33* 1.16 1.48* 1.53*  --   LATICACIDVEN  --   --   --   --   --   --  3.1* 1.6   < > = values in this interval not displayed.    Estimated Creatinine Clearance: 92.1 mL/min (A) (by C-G formula based on SCr of 1.53 mg/dL (H)).    Allergies  Allergen Reactions  . Demerol [Meperidine] Itching  . Morphine And Related Itching    Antimicrobials this admission: 2/19 zosyn >>  2/19 vancomycin >>  :   Thank you for allowing pharmacy to be a part of this patient's care.  Hessie Knows, PharmD, BCPS 12/23/2017 7:37 AM

## 2017-12-23 NOTE — Progress Notes (Signed)
Inpatient Diabetes Program Recommendations  AACE/ADA: New Consensus Statement on Inpatient Glycemic Control (2015)  Target Ranges:  Prepandial:   less than 140 mg/dL      Peak postprandial:   less than 180 mg/dL (1-2 hours)      Critically ill patients:  140 - 180 mg/dL   Lab Results  Component Value Date   GLUCAP 300 (H) 12/23/2017   HGBA1C 11.0 (H) 12/20/2017    Review of Glycemic Control  Blood sugars > 300 mg/dL. Needs insulin adjustment.   Inpatient Diabetes Program Recommendations:    Increase Lantus to 40 units bid Increase Novolog to 15 units tidwc  Continue to follow closely.  Thank you. Ailene Ards, RD, LDN, CDE Inpatient Diabetes Coordinator [phone]

## 2017-12-23 NOTE — Telephone Encounter (Signed)
Copied from CRM [id number]. Topic: General - Other >> Dec 23, 2017  3:01 PM Stephannie Li, NT wrote: Reason for CRM: Patients sister Sela Hua called to make sure FMLA paperwork was received for the patient please call her at  [phone] with any questions , he is currently inpatient .

## 2017-12-23 NOTE — Progress Notes (Signed)
Patient has a reddened, raised and foul smelling spot in his left groin/inner thigh. Patient is complaining of pain. RN notified Dr. Craige Cotta.

## 2017-12-24 ENCOUNTER — Ambulatory Visit: Payer: BLUE CROSS/BLUE SHIELD | Admitting: Family Medicine

## 2017-12-24 DIAGNOSIS — L0231 Cutaneous abscess of buttock: Secondary | ICD-10-CM | POA: Diagnosis present

## 2017-12-24 LAB — BASIC METABOLIC PANEL
ANION GAP: 12 (ref 5–15)
BUN: 17 mg/dL (ref 6–20)
CHLORIDE: 109 mmol/L (ref 101–111)
CO2: 16 mmol/L — ABNORMAL LOW (ref 22–32)
Calcium: 7.9 mg/dL — ABNORMAL LOW (ref 8.9–10.3)
Creatinine, Ser: 1.79 mg/dL — ABNORMAL HIGH (ref 0.61–1.24)
GFR calc Af Amer: 50 mL/min — ABNORMAL LOW (ref >60)
GFR calc non Af Amer: 43 mL/min — ABNORMAL LOW (ref >60)
GLUCOSE: 220 mg/dL — AB (ref 65–99)
POTASSIUM: 3.2 mmol/L — AB (ref 3.5–5.1)
Sodium: 137 mmol/L (ref 135–145)

## 2017-12-24 LAB — GLUCOSE, CAPILLARY
GLUCOSE-CAPILLARY: 200 mg/dL — AB (ref 65–99)
GLUCOSE-CAPILLARY: 213 mg/dL — AB (ref 65–99)
Glucose-Capillary: 210 mg/dL — ABNORMAL HIGH (ref 65–99)

## 2017-12-24 LAB — CBC
HCT: 38.6 % — ABNORMAL LOW (ref 39.0–52.0)
HEMOGLOBIN: 12.7 g/dL — AB (ref 13.0–17.0)
MCH: 31.4 pg (ref 26.0–34.0)
MCHC: 32.9 g/dL (ref 30.0–36.0)
MCV: 95.3 fL (ref 78.0–100.0)
PLATELETS: 186 10*3/uL (ref 150–400)
RBC: 4.05 MIL/uL — AB (ref 4.22–5.81)
RDW: 14.3 % (ref 11.5–15.5)
WBC: 22.7 10*3/uL — AB (ref 4.0–10.5)

## 2017-12-24 MED ORDER — INSULIN ASPART 100 UNIT/ML ~~LOC~~ SOLN
12.0000 [IU] | Freq: Three times a day (TID) | SUBCUTANEOUS | Status: DC
  Administered 2017-12-24 – 2017-12-26 (×5): 12 [IU] via SUBCUTANEOUS

## 2017-12-24 MED ORDER — METRONIDAZOLE IN NACL 5-0.79 MG/ML-% IV SOLN
500.0000 mg | Freq: Three times a day (TID) | INTRAVENOUS | Status: DC
  Administered 2017-12-24 – 2017-12-26 (×6): 500 mg via INTRAVENOUS
  Filled 2017-12-24 (×7): qty 100

## 2017-12-24 MED ORDER — RISAQUAD PO CAPS
1.0000 | ORAL_CAPSULE | Freq: Every day | ORAL | Status: DC
  Administered 2017-12-24 – 2017-12-26 (×3): 1 via ORAL
  Filled 2017-12-24 (×3): qty 1

## 2017-12-24 MED ORDER — LOPERAMIDE HCL 2 MG PO CAPS
2.0000 mg | ORAL_CAPSULE | ORAL | Status: DC | PRN
  Administered 2017-12-24: 2 mg via ORAL
  Filled 2017-12-24: qty 1

## 2017-12-24 MED ORDER — POTASSIUM CHLORIDE CRYS ER 20 MEQ PO TBCR
40.0000 meq | EXTENDED_RELEASE_TABLET | Freq: Once | ORAL | Status: AC
  Administered 2017-12-24: 40 meq via ORAL
  Filled 2017-12-24: qty 2

## 2017-12-24 MED ORDER — SODIUM CHLORIDE 0.9 % IV SOLN
2.0000 g | Freq: Two times a day (BID) | INTRAVENOUS | Status: DC
  Administered 2017-12-24 – 2017-12-26 (×3): 2 g via INTRAVENOUS
  Filled 2017-12-24 (×4): qty 2

## 2017-12-24 NOTE — Progress Notes (Signed)
PROGRESS NOTE    Roney Youtz  ZOX:[id number] DOB: 09/03/1966 DOA: 12/20/2017 PCP: Helane Rima, DO   Outpatient Specialists:     Brief Narrative:  Naythan Douthit is a 52 y.o. male with PMH of HTN, CHF, Obesity, DM presented with progressive generalized tiredness, fatigue for 1-2 weeks. He states that he has developed polydipsia, polyuria and diarrhea associated with weakness and tiredness. He had dry mouth. No vomiting. He reports 1-2 episodes of non bloody stools per day. He reports dizziness, lightheadedness, no fevers, no acute shortness of breath, no chest pains, no focal neuro weakness or paresthesias.  He states that he has been taking trulicity but does not check his glucose at home. In the ED: found to be in DKA. Once DKA treated, patient began to have fevers-- ? Source PNA (no cough) vs cellulitis/abscess of the groin.     Assessment & Plan:   Active Problems:   Essential hypertension   Diabetes mellitus type 2 in obese (HCC)   Diarrhea   DKA (diabetic ketoacidoses) (HCC)   Pressure injury of skin   HHS/DKA due to uncontrolled DM - hyperglycemia, polydipsia, polyuria. -transition to Sq lantus BID -HgbA1c went from 7 one month ago to 11-- says he only missed 1 dose of his trulicity -diabetic coordinator consult for further recommendations -SSI + novolog and lantus while here in hospital  Fever -? PNA vs cellulitis/abscess -blood cultures pending -IV vanc and change zosyn to flagyl/cefepime -CT scan of abd/pelvis: Findings are suspicious for small abscess or phlegmon in the subcutaneous fat of the medial right buttock region, with overlying soft tissue stranding which may reflect an associated cellulitis.  Leukocytosis with bandemia -see above for work up -concerning as still increasing  AKI- baseline around 1.1 -monitor labs  H/o CHF -Echo (2017): lvef-35-40%. Then improved in 2018: LVEF 50-55% -monitor closely  HTN.  -resume meds as  able  Hypernatremia -resolved  Multiple moisture associated wounds -wound care  Obesity Body mass index is 41.91 kg/m.  DVT prophylaxis:  Lovenox  Code Status: Full Code   Family Communication: Sister/mom at bedside--- per mom, [facility] overcharges for everything and "bullies" patients into doing things  Disposition Plan:     Consultants:      Subjective: Fever last PM again Pain in left groin/testicle  Objective: Vitals:   12/23/17 2140 12/24/17 0721 12/24/17 0951 12/24/17 1400  BP: 126/60 130/73 134/86 126/60  Pulse: (!) 107 (!) 102 (!) 113 (!) 103  Resp: 19 (!) 22 18 19   Temp: (!) 100.7 F (38.2 C) 97.7 F (36.5 C) 98.9 F (37.2 C) (!) 100.8 F (38.2 C)  TempSrc: Oral Oral Oral Oral  SpO2: 94%  95% 100%  Weight:      Height:        Intake/Output Summary (Last 24 hours) at 12/24/2017 1426 Last data filed at 12/24/2017 1400 Gross per 24 hour  Intake 2910 ml  Output 1700 ml  Net 1210 ml   Filed Weights   12/20/17 1735  Weight: (!) 152.1 kg (335 lb 5.1 oz)    Examination:  General exam: sitting in chair Respiratory system: no increase work of breathing, no wheezing Cardiovascular system: rrr Gastrointestinal system: +Bs, soft Central nervous system: alert Skin: folds of skin moist in groin, less tender to palpation    Data Reviewed: I have personally reviewed following labs and imaging studies  CBC: Recent Labs  Lab 12/20/17 1210 12/20/17 1411 12/22/17 0656 12/22/17 2152 12/24/17 0500  WBC 12.0*  --  14.8* 18.3* 22.7*  NEUTROABS  --   --   --  13.6*  --   HGB 16.6 18.4* 15.3 15.7 12.7*  HCT 50.1 54.0* 46.1 46.7 38.6*  MCV 97.3  --  95.6 94.9 95.3  PLT 287  --  207 218 186   Basic Metabolic Panel: Recent Labs  Lab 12/21/17 0339 12/21/17 0755 12/22/17 0656 12/22/17 2152 12/23/17 0705 12/24/17 0500  NA 147* 146* 138 137  --  137  K 4.0 4.6 3.7 3.8  --  3.2*  CL 116* 122* 108 107  --  109  CO2 21* 14* 19* 20*  --  16*   GLUCOSE 194* 153* 388* 333*  --  220*  BUN 23* 21* 18 22*  --  17  CREATININE 1.33* 1.16 1.48* 1.53* 1.68* 1.79*  CALCIUM 9.2 9.1 8.5* 8.7*  --  7.9*   GFR: Estimated Creatinine Clearance: 78.7 mL/min (A) (by C-G formula based on SCr of 1.79 mg/dL (H)). Liver Function Tests: Recent Labs  Lab 12/20/17 1356 12/22/17 2152  AST 15 18  ALT 16* 15*  ALKPHOS 183* 146*  BILITOT 1.2 0.8  PROT 8.2* 8.2*  ALBUMIN 3.5 3.4*   Recent Labs  Lab 12/20/17 1356  LIPASE 24   No results for input(s): AMMONIA in the last 168 hours. Coagulation Profile: No results for input(s): INR, PROTIME in the last 168 hours. Cardiac Enzymes: No results for input(s): CKTOTAL, CKMB, CKMBINDEX, TROPONINI in the last 168 hours. BNP (last 3 results) Recent Labs    06/11/17 0916 11/19/17 0941  PROBNP 167.0* 75.0   HbA1C: No results for input(s): HGBA1C in the last 72 hours. CBG: Recent Labs  Lab 12/23/17 1143 12/23/17 1622 12/23/17 2209 12/24/17 0737 12/24/17 1153  GLUCAP 295* 247* 177* 200* 210*   Lipid Profile: No results for input(s): CHOL, HDL, LDLCALC, TRIG, CHOLHDL, LDLDIRECT in the last 72 hours. Thyroid Function Tests: No results for input(s): TSH, T4TOTAL, FREET4, T3FREE, THYROIDAB in the last 72 hours. Anemia Panel: No results for input(s): VITAMINB12, FOLATE, FERRITIN, TIBC, IRON, RETICCTPCT in the last 72 hours. Urine analysis:    Component Value Date/Time   COLORURINE STRAW (A) 12/20/2017 1150   APPEARANCEUR CLEAR 12/20/2017 1150   LABSPEC 1.028 12/20/2017 1150   PHURINE 5.0 12/20/2017 1150   GLUCOSEU >=500 (A) 12/20/2017 1150   HGBUR SMALL (A) 12/20/2017 1150   BILIRUBINUR NEGATIVE 12/20/2017 1150   KETONESUR 80 (A) 12/20/2017 1150   PROTEINUR NEGATIVE 12/20/2017 1150   NITRITE NEGATIVE 12/20/2017 1150   LEUKOCYTESUR NEGATIVE 12/20/2017 1150      Recent Results (from the past 240 hour(s))  MRSA PCR Screening     Status: None   Collection Time: 12/20/17  5:37 PM   Result Value Ref Range Status   MRSA by PCR NEGATIVE NEGATIVE Final    Comment:        The GeneXpert MRSA Assay (FDA approved for NASAL specimens only), is one component of a comprehensive MRSA colonization surveillance program. It is not intended to diagnose MRSA infection nor to guide or monitor treatment for MRSA infections. Performed at [facility], [address] [place], [place] [address]   Culture, blood (Routine X 2) w Reflex to ID Panel     Status: None (Preliminary result)   Collection Time: 12/22/17 10:19 AM  Result Value Ref Range Status   Specimen Description   Final    BLOOD RIGHT HAND Performed at [facility], [address]  [place], [place] [address]    Special Requests   Final    IN PEDIATRIC BOTTLE Blood Culture adequate volume Performed at [facility], [address], [place], [place] [address]    Culture   Final    NO GROWTH 1 DAY Performed at [facility], [address], [place], [place] [address]    Report Status PENDING  Incomplete  Culture, blood (Routine X 2) w Reflex to ID Panel     Status: None (Preliminary result)   Collection Time: 12/22/17 10:19 AM  Result Value Ref Range Status   Specimen Description   Final    BLOOD LEFT HAND Performed at [facility], [address] [place], [place] [address]    Special Requests   Final    IN PEDIATRIC BOTTLE Blood Culture adequate volume Performed at [name], [address] [place], [place] [address]    Culture   Final    NO GROWTH 1 DAY Performed at [facility], [address], [place], [place] [address]    Report Status PENDING  Incomplete      Anti-infectives (From admission, onward)   Start     Dose/Rate Route Frequency Ordered Stop   12/24/17 1430  metroNIDAZOLE (FLAGYL) IVPB 500 mg     500 mg 100 mL/hr over 60 Minutes Intravenous Every 8 hours 12/24/17 1426     12/24/17  0000  vancomycin (VANCOCIN) 2,000 mg in sodium chloride 0.9 % 500 mL IVPB  Status:  Discontinued     2,000 mg 250 mL/hr over 120 Minutes Intravenous Every 24 hours 12/23/17 0739 12/24/17 1236   12/23/17 1200  vancomycin (VANCOCIN) IVPB 750 mg/150 ml premix  Status:  Discontinued     750 mg 150 mL/hr over 60 Minutes Intravenous Every 8 hours 12/23/17 0243 12/23/17 0735   12/23/17 0800  piperacillin-tazobactam (ZOSYN) IVPB 3.375 g  Status:  Discontinued     3.375 g 12.5 mL/hr over 240 Minutes Intravenous Every 8 hours 12/23/17 0243 12/24/17 1426   12/22/17 2359  vancomycin (VANCOCIN) 2,500 mg in sodium chloride 0.9 % 500 mL IVPB     2,500 mg 250 mL/hr over 120 Minutes Intravenous  Once 12/22/17 2343 12/23/17 0430   12/22/17 2345  piperacillin-tazobactam (ZOSYN) IVPB 3.375 g     3.375 g 100 mL/hr over 30 Minutes Intravenous  Once 12/22/17 2343 12/23/17 2831       Radiology Studies: Ct Abdomen Pelvis Wo Contrast  Result Date: 12/23/2017 CLINICAL DATA:  52 year old male with history of red, raised, foul smelling spot in his left inner groin. EXAM: CT ABDOMEN AND PELVIS WITHOUT CONTRAST TECHNIQUE: Multidetector CT imaging of the abdomen and pelvis was performed following the standard protocol without IV contrast. COMPARISON:  No priors. FINDINGS: Lower chest: Small amount of scarring or subsegmental atelectasis in the right lower lobe. Hepatobiliary: Severe diffuse low attenuation throughout the hepatic parenchyma, compatible with a background of severe hepatic steatosis. No definite cystic or solid hepatic lesions are confidently identified on today's noncontrast CT examination. Unenhanced appearance of the gallbladder is normal. Pancreas: No definite pancreatic mass or peripancreatic fluid or inflammatory changes are noted on today's noncontrast CT examination. Spleen: Unremarkable. Adrenals/Urinary Tract: Unenhanced appearance of the kidneys and bilateral adrenal glands is normal. No  hydroureteronephrosis. Urinary bladder is normal in appearance. Stomach/Bowel: Unenhanced appearance of the stomach is normal. No pathologic dilatation of small bowel or colon. Normal appendix. Vascular/Lymphatic: Small amount of atherosclerotic calcifications noted  in the pelvic vasculature. No definite aneurysm identified in the abdominal or pelvic vasculature. No lymphadenopathy noted in the abdomen or pelvis. Reproductive: Prostate gland and seminal vesicles are unremarkable in appearance. Other: No significant volume of ascites. No pneumoperitoneum. In the subcutaneous fat in the medial aspect of the right buttock region there is a focal area of low attenuation measuring 2.4 x 3.3 cm (axial image 99 of series 2) with extensive surrounding fat stranding, suspicious for a small abscess or phlegmon. Musculoskeletal: Orthopedic fixation hardware in the right proximal femur incompletely imaged. There are no aggressive appearing lytic or blastic lesions noted in the visualized portions of the skeleton. IMPRESSION: 1. Findings are suspicious for small abscess or phlegmon in the subcutaneous fat of the medial right buttock region, with overlying soft tissue stranding which may reflect an associated cellulitis. 2. No other acute findings are noted elsewhere in the abdomen or pelvis. 3. Normal appendix. 4. Severe hepatic steatosis. Electronically Signed   By: Trudie Reed M.D.   On: 12/23/2017 16:10   Dg Chest Port 1 View  Result Date: 12/22/2017 CLINICAL DATA:  Fever and shortness of breath EXAM: PORTABLE CHEST 1 VIEW COMPARISON:  12/20/2017, 11/19/2016 FINDINGS: Streaky atelectasis or minimal infiltrate at the left base. No pleural effusion. Right lung clear. Stable cardiomediastinal silhouette. No pneumothorax. IMPRESSION: Streaky atelectasis or minimal infiltrate at the left lung base. Electronically Signed   By: Jasmine Pang M.D.   On: 12/22/2017 22:03        Scheduled Meds: . acidophilus  1 capsule  Oral Daily  . aspirin EC  81 mg Oral Daily  . enoxaparin (LOVENOX) injection  80 mg Subcutaneous Q24H  . Gerhardt's butt cream   Topical QID  . insulin aspart  0-20 Units Subcutaneous TID WC  . insulin aspart  0-5 Units Subcutaneous QHS  . insulin aspart  12 Units Subcutaneous TID WC  . insulin glargine  30 Units Subcutaneous BID  . metoprolol tartrate  50 mg Oral BID  . simvastatin  20 mg Oral QHS   Continuous Infusions: . metronidazole    . sodium chloride Stopped (12/23/17 0329)  . sodium chloride Stopped (12/22/17 2350)     LOS: 4 days    Time spent: 35 min    Joseph Art, DO [facility] Pager [phone]  If 7PM-7AM, please contact night-coverage [email or web address] Password [email or web address] 12/24/2017, 2:26 PM

## 2017-12-24 NOTE — Consult Note (Signed)
Reason for Consult: Right buttocks abscess or phlegmon Referring Physician: Dr Princella Ion CC: fatigue and dehydration  Luis [name] is an 52 y.o. male.    HPI: Patient admitted on 12/20/17 with progressive tiredness and fatigue.  Found to be in DKA with anion gap acidosis.  He reports a sore area on his buttocks couple days prior to admission.  No fever at home.  After admission he did develop a fever up to 103.2 on 12/23/17.  He was started on Zosyn and vancomycin.  WBC has been elevated since admission but went up to 22.7 this a.m.  CT scan obtained on 12/23/17, shows an area suspicious for a small abscess or phlegmon the subcutaneous fat of the right medial buttocks.  Overlying soft tissue stranding which might be reflective of a cellulitis.  No other acute findings were found.  Normal appendix, severe hepatic steatosis.  We were asked to see the patient for possible I&D of right buttocks abscess.  Past Medical History:  Diagnosis Date  . Abnormal nuclear stress test 03/24/2016  . Atrial fibrillation ([place])   . CHF (congestive heart failure) ([place])   . DM (diabetes mellitus) ([place])   . Essential hypertension   . History of tobacco abuse   . HTN (hypertension)   . Hyperlipidemia   . Morbid obesity ([place])   . OSA (obstructive sleep apnea)   . Pneumonia 2017    Past Surgical History:  Procedure Laterality Date  . CARDIAC CATHETERIZATION N/A 03/24/2016   Procedure: Left Heart Cath and Coronary Angiography;  Surgeon: Leonie Man, MD;  Location: [facility];  Service: Cardiovascular;  Laterality: N/A;  . FEMUR IM NAIL Right 2012  . FRACTURE SURGERY      Family History  Problem Relation Age of Onset  . Diabetes Mellitus II Mother   . Diabetes type II Other   . Hypertension Maternal Grandfather   . Stroke Neg Hx   . Cancer Neg Hx     Social History:  reports that he quit smoking about 8 years ago. His smoking use included cigarettes. He has a 10.00 pack-year smoking history.  he has never used smokeless tobacco. He reports that he does not drink alcohol or use drugs.  Allergies:  Allergies  Allergen Reactions  . Demerol [Meperidine] Itching  . Morphine And Related Itching    Medications:  Prior to Admission:  Medications Prior to Admission  Medication Sig Dispense Refill Last Dose  . albuterol (PROVENTIL HFA;VENTOLIN HFA) 108 (90 Base) MCG/ACT inhaler Inhale 2 puffs into the lungs every 4 (four) hours as needed for wheezing. 1 Inhaler 0 Past Month at Unknown time  . aspirin EC 81 MG EC tablet Take 1 tablet (81 mg total) by mouth daily. 30 tablet 0 Past Month at Unknown time  . Dulaglutide (TRULICITY) 1.5 PP/2.9JJ SOPN Inject 1 pen into the skin once a week. 12 pen 3 12/19/2017 at Unknown time  . loperamide (IMODIUM) 2 MG capsule Take 1 capsule (2 mg total) by mouth 4 (four) times daily as needed for diarrhea or loose stools. 30 capsule 0 Past Week at Unknown time  . losartan (COZAAR) 25 MG tablet Take 1 tablet (25 mg total) by mouth daily. 30 tablet 2 Past Week at Unknown time  . Metoprolol Tartrate 37.5 MG TABS Take 37.5 mg by mouth 2 (two) times daily. 180 tablet 0 12/19/2017 at Unknown time  . Phenyleph-CPM-DM-APAP (ALKA-SELTZER PLUS COLD & COUGH) 03-04-09-325 MG CAPS Take 1 tablet by mouth daily.  12/19/2017 at Unknown time  . simvastatin (ZOCOR) 20 MG tablet Take 1 tablet (20 mg total) by mouth at bedtime. 90 tablet 3 Past Week at Unknown time  . torsemide (DEMADEX) 10 MG tablet Take 1 - 2 tablets daily for swelling 180 tablet 1 Past Week at Unknown time  . traZODone (DESYREL) 50 MG tablet Take 0.5-1 tablets (25-50 mg total) by mouth at bedtime as needed for sleep. 30 tablet 3 Past Week at Unknown time   Scheduled: . acidophilus  1 capsule Oral Daily  . aspirin EC  81 mg Oral Daily  . enoxaparin (LOVENOX) injection  80 mg Subcutaneous Q24H  . Gerhardt's butt cream   Topical QID  . insulin aspart  0-20 Units Subcutaneous TID WC  . insulin aspart  0-5 Units  Subcutaneous QHS  . insulin aspart  10 Units Subcutaneous TID WC  . insulin glargine  30 Units Subcutaneous BID  . metoprolol tartrate  50 mg Oral BID  . simvastatin  20 mg Oral QHS   Continuous: . sodium chloride 100 mL/hr at 12/23/17 2142  . piperacillin-tazobactam (ZOSYN)  IV Stopped (12/24/17 8563)  . sodium chloride Stopped (12/23/17 0329)  . sodium chloride Stopped (12/22/17 2350)  . vancomycin 2,000 mg (12/24/17 0109)   Anti-infectives (From admission, onward)   Start     Dose/Rate Route Frequency Ordered Stop   12/24/17 0000  vancomycin (VANCOCIN) 2,000 mg in sodium chloride 0.9 % 500 mL IVPB     2,000 mg 250 mL/hr over 120 Minutes Intravenous Every 24 hours 12/23/17 0739     12/23/17 1200  vancomycin (VANCOCIN) IVPB 750 mg/150 ml premix  Status:  Discontinued     750 mg 150 mL/hr over 60 Minutes Intravenous Every 8 hours 12/23/17 0243 12/23/17 0735   12/23/17 0800  piperacillin-tazobactam (ZOSYN) IVPB 3.375 g     3.375 g 12.5 mL/hr over 240 Minutes Intravenous Every 8 hours 12/23/17 0243     12/22/17 2359  vancomycin (VANCOCIN) 2,500 mg in sodium chloride 0.9 % 500 mL IVPB     2,500 mg 250 mL/hr over 120 Minutes Intravenous  Once 12/22/17 2343 12/23/17 0430   12/22/17 2345  piperacillin-tazobactam (ZOSYN) IVPB 3.375 g     3.375 g 100 mL/hr over 30 Minutes Intravenous  Once 12/22/17 2343 12/23/17 1497      Results for orders placed or performed during the hospital encounter of 12/20/17 (from the past 48 hour(s))  Culture, blood (Routine X 2) w Reflex to ID Panel     Status: None (Preliminary result)   Collection Time: 12/22/17 10:19 AM  Result Value Ref Range   Specimen Description      BLOOD RIGHT HAND Performed at [facility] [address], [place], [place] [address]    Special Requests      IN PEDIATRIC BOTTLE Blood Culture adequate volume Performed at [facility] [address], [place], [place] [address]    Culture       NO GROWTH 1 DAY Performed at [facility] [address], [place], [place] [address]    Report Status PENDING   Culture, blood (Routine X 2) w Reflex to ID Panel     Status: None (Preliminary result)   Collection Time: 12/22/17 10:19 AM  Result Value Ref Range   Specimen Description      BLOOD LEFT HAND Performed at [facility] [address], [place], [place] [address]    Special Requests  IN PEDIATRIC BOTTLE Blood Culture adequate volume Performed at [facility] [address], [place], [place] [address]    Culture      NO GROWTH 1 DAY Performed at [facility] [address] [place], [place] [address]    Report Status PENDING   Glucose, capillary     Status: Abnormal   Collection Time: 12/22/17 11:52 AM  Result Value Ref Range   Glucose-Capillary 385 (H) 65 - 99 mg/dL  Glucose, capillary     Status: Abnormal   Collection Time: 12/22/17  4:17 PM  Result Value Ref Range   Glucose-Capillary 350 (H) 65 - 99 mg/dL  CBC with Differential/Platelet     Status: Abnormal   Collection Time: 12/22/17  9:52 PM  Result Value Ref Range   WBC 18.3 (H) 4.0 - 10.5 K/uL   RBC 4.92 4.22 - 5.81 MIL/uL   Hemoglobin 15.7 13.0 - 17.0 g/dL   HCT 46.7 39.0 - 52.0 %   MCV 94.9 78.0 - 100.0 fL   MCH 31.9 26.0 - 34.0 pg   MCHC 33.6 30.0 - 36.0 g/dL   RDW 14.1 11.5 - 15.5 %   Platelets 218 150 - 400 K/uL   Neutrophils Relative % 74 %   Neutro Abs 13.6 (H) 1.7 - 7.7 K/uL   Lymphocytes Relative 17 %   Lymphs Abs 3.0 0.7 - 4.0 K/uL   Monocytes Relative 9 %   Monocytes Absolute 1.6 (H) 0.1 - 1.0 K/uL   Eosinophils Relative 0 %   Eosinophils Absolute 0.0 0.0 - 0.7 K/uL   Basophils Relative 0 %   Basophils Absolute 0.0 0.0 - 0.1 K/uL    Comment: Performed at [facility], [place] [address] [place], [place] [address]  Comprehensive metabolic panel     Status: Abnormal   Collection Time: 12/22/17  9:52 PM  Result  Value Ref Range   Sodium 137 135 - 145 mmol/L   Potassium 3.8 3.5 - 5.1 mmol/L   Chloride 107 101 - 111 mmol/L   CO2 20 (L) 22 - 32 mmol/L   Glucose, Bld 333 (H) 65 - 99 mg/dL   BUN 22 (H) 6 - 20 mg/dL   Creatinine, Ser 1.53 (H) 0.61 - 1.24 mg/dL   Calcium 8.7 (L) 8.9 - 10.3 mg/dL   Total Protein 8.2 (H) 6.5 - 8.1 g/dL   Albumin 3.4 (L) 3.5 - 5.0 g/dL   AST 18 15 - 41 U/L   ALT 15 (L) 17 - 63 U/L   Alkaline Phosphatase 146 (H) 38 - 126 U/L   Total Bilirubin 0.8 0.3 - 1.2 mg/dL   GFR calc non Af Amer 52 (L) >60 mL/min   GFR calc Af Amer 60 (L) >60 mL/min    Comment: (NOTE) The eGFR has been calculated using the CKD EPI equation. This calculation has not been validated in all clinical situations. eGFR's persistently <60 mL/min signify possible Chronic Kidney Disease.    Anion gap 10 5 - 15    Comment: Performed at [facility], [place] [address], [place], [place] [address]  Lactic acid, plasma     Status: Abnormal   Collection Time: 12/22/17  9:52 PM  Result Value Ref Range   Lactic Acid, Venous 3.1 (HH) 0.5 - 1.9 mmol/L    Comment: CRITICAL RESULT CALLED TO, READ BACK BY AND VERIFIED WITH: PEREZ,A RN 2.19.19 '@2302'$  ZANDO,C Performed at [facility], [place] [address] [place],  [place] [address]   Glucose, capillary     Status: Abnormal   Collection Time: 12/22/17  9:52 PM  Result Value Ref Range   Glucose-Capillary 311 (H) 65 - 99 mg/dL  Influenza panel by PCR (type A & B)     Status: None   Collection Time: 12/22/17 10:36 PM  Result Value Ref Range   Influenza A By PCR NEGATIVE NEGATIVE   Influenza B By PCR NEGATIVE NEGATIVE    Comment: (NOTE) The Xpert Xpress Flu assay is intended as an aid in the diagnosis of  influenza and should not be used as a sole basis for treatment.  This  assay is FDA approved for nasopharyngeal swab specimens only. Nasal  washings and aspirates are unacceptable for Xpert Xpress Flu testing. Performed at [facility], [place] [address] [place], [place] [address]   Lactic acid, plasma     Status: None   Collection Time: 12/22/17 11:57 PM  Result Value Ref Range   Lactic Acid, Venous 1.6 0.5 - 1.9 mmol/L    Comment: Performed at [facility], [place] [address] [place], [place] [address]  Glucose, capillary     Status: Abnormal   Collection Time: 12/23/17 12:26 AM  Result Value Ref Range   Glucose-Capillary 295 (H) 65 - 99 mg/dL  Creatinine, serum     Status: Abnormal   Collection Time: 12/23/17  7:05 AM  Result Value Ref Range   Creatinine, Ser 1.68 (H) 0.61 - 1.24 mg/dL   GFR calc non Af Amer 46 (L) >60 mL/min   GFR calc Af Amer 54 (L) >60 mL/min    Comment: (NOTE) The eGFR has been calculated using the CKD EPI equation. This calculation has not been validated in all clinical situations. eGFR's persistently <60 mL/min signify possible Chronic Kidney Disease. Performed at [facility], [place] [address] [place], [place] [address]   Glucose, capillary     Status: Abnormal   Collection Time: 12/23/17  7:21 AM  Result Value Ref Range   Glucose-Capillary 300 (H) 65 - 99 mg/dL  Glucose, capillary     Status: Abnormal   Collection Time: 12/23/17 11:43 AM  Result Value Ref Range   Glucose-Capillary 295 (H) 65 - 99 mg/dL  Glucose, capillary     Status: Abnormal   Collection Time: 12/23/17  4:22 PM  Result Value Ref Range   Glucose-Capillary 247 (H) 65 - 99 mg/dL  Glucose, capillary     Status: Abnormal   Collection Time: 12/23/17 10:09 PM  Result Value Ref Range   Glucose-Capillary 177 (H) 65 - 99 mg/dL  CBC     Status: Abnormal   Collection Time: 12/24/17  5:00 AM  Result Value Ref Range   WBC 22.7 (H) 4.0 - 10.5 K/uL   RBC 4.05 (L) 4.22 - 5.81 MIL/uL   Hemoglobin 12.7 (L) 13.0 - 17.0 g/dL   HCT 38.6 (L) 39.0 - 52.0 %   MCV 95.3 78.0 - 100.0 fL   MCH 31.4 26.0 - 34.0 pg   MCHC 32.9 30.0 - 36.0 g/dL   RDW 14.3 11.5 - 15.5 %    Platelets 186 150 - 400 K/uL    Comment: Performed at [facility], [place] [address] [place], [place] [address]  Basic metabolic panel     Status: Abnormal   Collection Time: 12/24/17  5:00 AM  Result Value Ref Range   Sodium 137 135 - 145 mmol/L   Potassium 3.2 (  L) 3.5 - 5.1 mmol/L   Chloride 109 101 - 111 mmol/L   CO2 16 (L) 22 - 32 mmol/L   Glucose, Bld 220 (H) 65 - 99 mg/dL   BUN 17 6 - 20 mg/dL   Creatinine, Ser 1.79 (H) 0.61 - 1.24 mg/dL   Calcium 7.9 (L) 8.9 - 10.3 mg/dL   GFR calc non Af Amer 43 (L) >60 mL/min   GFR calc Af Amer 50 (L) >60 mL/min    Comment: (NOTE) The eGFR has been calculated using the CKD EPI equation. This calculation has not been validated in all clinical situations. eGFR's persistently <60 mL/min signify possible Chronic Kidney Disease.    Anion gap 12 5 - 15    Comment: Performed at [facility], [place] [address] [place], [place] [address]  Glucose, capillary     Status: Abnormal   Collection Time: 12/24/17  7:37 AM  Result Value Ref Range   Glucose-Capillary 200 (H) 65 - 99 mg/dL    Ct Abdomen Pelvis Wo Contrast  Result Date: 12/23/2017 CLINICAL DATA:  52 year old male with history of red, raised, foul smelling spot in his left inner groin. EXAM: CT ABDOMEN AND PELVIS WITHOUT CONTRAST TECHNIQUE: Multidetector CT imaging of the abdomen and pelvis was performed following the standard protocol without IV contrast. COMPARISON:  No priors. FINDINGS: Lower chest: Small amount of scarring or subsegmental atelectasis in the right lower lobe. Hepatobiliary: Severe diffuse low attenuation throughout the hepatic parenchyma, compatible with a background of severe hepatic steatosis. No definite cystic or solid hepatic lesions are confidently identified on today's noncontrast CT examination. Unenhanced appearance of the gallbladder is normal. Pancreas: No definite pancreatic mass or peripancreatic fluid or inflammatory changes are  noted on today's noncontrast CT examination. Spleen: Unremarkable. Adrenals/Urinary Tract: Unenhanced appearance of the kidneys and bilateral adrenal glands is normal. No hydroureteronephrosis. Urinary bladder is normal in appearance. Stomach/Bowel: Unenhanced appearance of the stomach is normal. No pathologic dilatation of small bowel or colon. Normal appendix. Vascular/Lymphatic: Small amount of atherosclerotic calcifications noted in the pelvic vasculature. No definite aneurysm identified in the abdominal or pelvic vasculature. No lymphadenopathy noted in the abdomen or pelvis. Reproductive: Prostate gland and seminal vesicles are unremarkable in appearance. Other: No significant volume of ascites. No pneumoperitoneum. In the subcutaneous fat in the medial aspect of the right buttock region there is a focal area of low attenuation measuring 2.4 x 3.3 cm (axial image 99 of series 2) with extensive surrounding fat stranding, suspicious for a small abscess or phlegmon. Musculoskeletal: Orthopedic fixation hardware in the right proximal femur incompletely imaged. There are no aggressive appearing lytic or blastic lesions noted in the visualized portions of the skeleton. IMPRESSION: 1. Findings are suspicious for small abscess or phlegmon in the subcutaneous fat of the medial right buttock region, with overlying soft tissue stranding which may reflect an associated cellulitis. 2. No other acute findings are noted elsewhere in the abdomen or pelvis. 3. Normal appendix. 4. Severe hepatic steatosis. Electronically Signed   By: Vinnie Langton M.D.   On: 12/23/2017 16:10   Dg Chest Port 1 View  Result Date: 12/22/2017 CLINICAL DATA:  Fever and shortness of breath EXAM: PORTABLE CHEST 1 VIEW COMPARISON:  12/20/2017, 11/19/2016 FINDINGS: Streaky atelectasis or minimal infiltrate at the left base. No pleural effusion. Right lung clear. Stable cardiomediastinal silhouette. No pneumothorax. IMPRESSION: Streaky atelectasis  or minimal infiltrate at the left lung base. Electronically Signed   By: Madie Reno.D.  On: 12/22/2017 22:03    ROS  Review of Systems  Constitutional: Positive for malaise/fatigue. Negative for chills, diaphoresis and weight loss. Fever: some here not at home.  HENT: Negative.   Eyes: Negative.   Respiratory: Negative for cough, hemoptysis, sputum production, shortness of breath and wheezing.        He has tested positive for sleep apnea but he is not using the CPAP at this point.  Cardiovascular: Positive for leg swelling.  Gastrointestinal: Positive for diarrhea.  Genitourinary:       Came in with polyuria.  Musculoskeletal: Negative.   Skin: Negative for itching and rash.       Skin changes left buttocks and groin from moisture  Neurological: Positive for weakness.       Patient notes he was lightheaded when he came in, but no syncope, no his seizures, or focal changes.  Endo/Heme/Allergies: Positive for polydipsia.  Psychiatric/Behavioral: Negative.     Physical Exam  Blood pressure 130/73, pulse (!) 102, temperature 97.7 F (36.5 C), temperature source Oral, resp. rate (!) 22, height '6\' 3"'$  (1.905 m), weight (!) 152.1 kg (335 lb 5.1 oz), SpO2 94 %.   Constitutional: He is oriented to person, place, and time. She appears well-developed and well-nourished. No distress.  HENT:  Head: Normocephalic and atraumatic.  Mouth/Throat: Oropharynx is clear and moist. No oropharyngeal exudate.  Eyes: Right eye exhibits no discharge. Left eye exhibits no discharge. No scleral icterus.  Pupils are equal  Neck: Normal range of motion. Neck supple. No JVD present. No tracheal deviation present. No thyromegaly present.  Cardiovascular: Normal rate, regular rhythm and intact distal pulses.  No murmur heard. Respiratory: Effort normal and breath sounds normal. No respiratory distress. He has no wheezes. He has no rales. She exhibits no tenderness.  GI: Soft. Bowel sounds are normal. She  exhibits no distension and no mass. There is no tenderness. There is no rebound and no guarding.  Musculoskeletal: She exhibits edema (trace). She exhibits no tenderness.  Neurological: She is alert and oriented to person, place, and time. A cranial nerve deficit is present.  Skin: Skin is warm and dry. She is not diaphoretic.  He has about a 3 cm area in the left buttocks somewhat raised indurated with no palpable fluid.  Seen and evaluated by Dr. Marcello Moores.  He has some moisture and irritation in the groins more so on the left than the right from body habitus, he currently has a cream covering this area.  Psychiatric: She has a normal mood and affect. Her behavior is normal. Judgment and thought content normal.   Assessment/Plan: Subcutaneous phlegmon right buttocks.   DKA -improved  Type 2 diabetes -hemoglobin A1c 11 Acute kidney injury -creatinine 1.79 Obstructive sleep apnea -diagnosed but not currently treated. BMI 41.9 History of tobacco use times 30 years; quit 1 year ago. History of alcohol use/heavy quit 1 year ago   Plan: He was seen and examined Marcello Moores.  It was her opinion this should resolve with antibiotics and improved glucose control.  This was discussed with the patient and his family in the room, along with Dr. Eliseo Squires.  At this time there is no surgical needs.  We will be available as needed.  Please call if we can help.  Kieana Livesay 12/24/2017, 9:40 AM

## 2017-12-24 NOTE — Progress Notes (Signed)
Inpatient Diabetes Program Recommendations  AACE/ADA: New Consensus Statement on Inpatient Glycemic Control (2015)  Target Ranges:  Prepandial:   less than 140 mg/dL      Peak postprandial:   less than 180 mg/dL (1-2 hours)      Critically ill patients:  140 - 180 mg/dL   Lab Results  Component Value Date   GLUCAP 200 (H) 12/24/2017   HGBA1C 11.0 (H) 12/20/2017    Review of Glycemic Control  Blood sugars > 180 mg/dL. Needs insulin adjustment.  Inpatient Diabetes Program Recommendations:     Increase Lantus to 32 units bid Increase Novolog to 12 units tidwc  Continue to follow.  Thank you. Ailene Ards, RD, LDN, CDE Inpatient Diabetes Coordinator [phone]

## 2017-12-24 NOTE — Progress Notes (Addendum)
Pharmacy Antibiotic Note  Luis [name] is a 52 y.o. male admitted on 12/20/2017 with sepsis.  Pharmacy has been consulted for zosyn and vancomycin dosing.  Plan: 1) Due to rising SCr, will stop vanc for now and check a vanc level in AM. 2) Changing Zosyn to cefepime - start cefepime 2g IV q12 3) Daily SCr 4) MRSA PCR neg so if PNA thought to be source then can safely discontinue vancomycin. Otherwise, would suggest changing Zosyn to cefepime +/- Flagyl as combination of cefepime/vanc does not increase risk of nephrotoxicity reportedly as zosyn/vanc combo  Height: 6\' 3"  (190.5 cm) Weight: (!) 335 lb 5.1 oz (152.1 kg) IBW/kg (Calculated) : 84.5  Temp (24hrs), Avg:99.1 F (37.3 C), Min:97.7 F (36.5 C), Max:100.7 F (38.2 C)  Recent Labs  Lab 12/20/17 1210  12/21/17 0755 12/22/17 0656 12/22/17 2152 12/22/17 2357 12/23/17 0705 12/24/17 0500  WBC 12.0*  --   --  14.8* 18.3*  --   --  22.7*  CREATININE  --    < > 1.16 1.48* 1.53*  --  1.68* 1.79*  LATICACIDVEN  --   --   --   --  3.1* 1.6  --   --    < > = values in this interval not displayed.    Estimated Creatinine Clearance: 78.7 mL/min (A) (by C-G formula based on SCr of 1.79 mg/dL (H)).    Allergies  Allergen Reactions  . Demerol [Meperidine] Itching  . Morphine And Related Itching    Antimicrobials this admission: 2/19 zosyn >> 2/21 2/19 vancomycin >>  2/21 >> cefepime >>   Thank you for allowing pharmacy to be a part of this patient's care.  Hessie Knows, PharmD, BCPS 12/24/2017 12:30 PM

## 2017-12-24 NOTE — Telephone Encounter (Signed)
Per Dr. Earlene Plater since that patient is still in the hospital her sister is going to have the social worker to help with the forms. I have placed the copy up front for the patient to fill out.

## 2017-12-25 ENCOUNTER — Telehealth: Payer: Self-pay

## 2017-12-25 DIAGNOSIS — L03317 Cellulitis of buttock: Secondary | ICD-10-CM

## 2017-12-25 DIAGNOSIS — L8990 Pressure ulcer of unspecified site, unspecified stage: Secondary | ICD-10-CM

## 2017-12-25 DIAGNOSIS — E1110 Type 2 diabetes mellitus with ketoacidosis without coma: Secondary | ICD-10-CM

## 2017-12-25 LAB — CBC WITH DIFFERENTIAL/PLATELET
BASOS PCT: 0 %
Basophils Absolute: 0 10*3/uL (ref 0.0–0.1)
Eosinophils Absolute: 0.1 10*3/uL (ref 0.0–0.7)
Eosinophils Relative: 1 %
HEMATOCRIT: 38.4 % — AB (ref 39.0–52.0)
HEMOGLOBIN: 12.6 g/dL — AB (ref 13.0–17.0)
LYMPHS ABS: 1.9 10*3/uL (ref 0.7–4.0)
Lymphocytes Relative: 13 %
MCH: 30.8 pg (ref 26.0–34.0)
MCHC: 32.8 g/dL (ref 30.0–36.0)
MCV: 93.9 fL (ref 78.0–100.0)
Monocytes Absolute: 1 10*3/uL (ref 0.1–1.0)
Monocytes Relative: 7 %
NEUTROS ABS: 11.8 10*3/uL — AB (ref 1.7–7.7)
NEUTROS PCT: 79 %
Platelets: 210 10*3/uL (ref 150–400)
RBC: 4.09 MIL/uL — AB (ref 4.22–5.81)
RDW: 14.4 % (ref 11.5–15.5)
WBC: 14.9 10*3/uL — AB (ref 4.0–10.5)

## 2017-12-25 LAB — BASIC METABOLIC PANEL
Anion gap: 11 (ref 5–15)
BUN: 13 mg/dL (ref 6–20)
CHLORIDE: 105 mmol/L (ref 101–111)
CO2: 20 mmol/L — ABNORMAL LOW (ref 22–32)
Calcium: 7.9 mg/dL — ABNORMAL LOW (ref 8.9–10.3)
Creatinine, Ser: 1.61 mg/dL — ABNORMAL HIGH (ref 0.61–1.24)
GFR calc non Af Amer: 49 mL/min — ABNORMAL LOW (ref >60)
GFR, EST AFRICAN AMERICAN: 56 mL/min — AB (ref >60)
Glucose, Bld: 340 mg/dL — ABNORMAL HIGH (ref 65–99)
POTASSIUM: 3.5 mmol/L (ref 3.5–5.1)
SODIUM: 136 mmol/L (ref 135–145)

## 2017-12-25 LAB — GLUCOSE, CAPILLARY
GLUCOSE-CAPILLARY: 279 mg/dL — AB (ref 65–99)
GLUCOSE-CAPILLARY: 309 mg/dL — AB (ref 65–99)
GLUCOSE-CAPILLARY: 156 mg/dL — AB (ref 65–99)
Glucose-Capillary: 190 mg/dL — ABNORMAL HIGH (ref 65–99)
Glucose-Capillary: 240 mg/dL — ABNORMAL HIGH (ref 65–99)
Glucose-Capillary: 248 mg/dL — ABNORMAL HIGH (ref 65–99)

## 2017-12-25 LAB — VANCOMYCIN, RANDOM: VANCOMYCIN RM: 7

## 2017-12-25 LAB — CREATININE, SERUM
Creatinine, Ser: 1.57 mg/dL — ABNORMAL HIGH (ref 0.61–1.24)
GFR calc non Af Amer: 50 mL/min — ABNORMAL LOW (ref >60)
GFR, EST AFRICAN AMERICAN: 58 mL/min — AB (ref >60)

## 2017-12-25 MED ORDER — VANCOMYCIN HCL 10 G IV SOLR
1250.0000 mg | Freq: Two times a day (BID) | INTRAVENOUS | Status: DC
  Administered 2017-12-26: 1250 mg via INTRAVENOUS
  Filled 2017-12-25 (×3): qty 1250

## 2017-12-25 NOTE — Progress Notes (Signed)
Pt does not want to be stuck by lab anymore.  He was told by a doctor, not sure who, that he should not be needing to have labs drawn so often.  He is a very hard stick.

## 2017-12-25 NOTE — Telephone Encounter (Signed)
Copied from [place]. Topic: Quick Communication - See Telephone Encounter >> Dec 24, 2017 11:47 AM Percell Belt A wrote: CRM for notification. See Telephone encounter for:  Luis [name] with [employer] called in about this pt disability claim .   She has some questions about this pt  Best number [phone] Ref# [id number] 12/24/17.

## 2017-12-25 NOTE — Progress Notes (Signed)
PROGRESS NOTE    Luis [name]  VXB:[id number] DOB: 1966-02-23 DOA: 12/20/2017 PCP: Helane Rima, DO    Brief Narrative:  52 year old male who presented fatigue and dehydration.  He does have a significant past medical history for hypertension, congestive heart failure, obesity and type 1 diabetes mellitus.  Patient presents with 1-2 weeks of worsening generalized weakness, associated with polydipsia, polyuria and diarrhea.  On the initial physical examination blood pressure 134/83, heart rate 103, respiratory 18, oxygen saturation 96%, dry mucous membranes, heart S1-S2 present, rhythmic, lungs were clear to auscultation bilaterally, abdomen soft nontender, no lower extremity edema.  Sodium 135, potassium 5.1, chloride 104, bicarb 13, glucose 781, BUN 30, creatinine 1.68, calcium 8.2, anion gap 18, white count 14.8, hemoglobin 15.3, hematocrit 46.1, platelets 207.  Urinalysis negative for infection.  CT of the abdomen with findings suspicious for small abscess of phlegmon in the subcutaneous fat of the medial right buttock region overlying soft tissue stranding which may reflect associated cellulitis.  Chest x-ray hypoinflated with no infiltrates.  Sinus rhythm EKG 101 bpm, lateral T wave inversions, normal intervals, normal axis.  Patient was admitted to the hospital with the working diagnosis of diabetes ketoacidosis, complicated by right gluteal cellulitis.    Assessment & Plan:   Active Problems:   Essential hypertension   Diabetes mellitus type 2 in obese (HCC)   Diarrhea   DKA (diabetic ketoacidoses) (HCC)   Pressure injury of skin   Fever   Cellulitis   1.  Type 1 diabetes mellitus status post diabetes ketoacidosis. Capillary glucose 213, 190, 240, 279, 309. Patient tolerating po and feeling well, will continue insulin regimen with insulin sliding scale, insulin glargine, and pre-meal aspart.   2.  Right gluteal cellulitis complicated by sepsis. Clinically is improving, wbc down  to 14,9, last fever spike yesterday 02/21 at 1400, will continue antibiotic therapy with vancomycin and cefepime for now, will plan to transition to po in am. No clinical signs of abscess.   3.  Chronic diastolic heart failure. Stable with no signs of exacerbation, will discontinue telemetry monitoring.   4.  Hypertension. Blood pressure controlled, will continue close monitoring. Continue metoprolol  5.  Morbid obesity. Will need outpatient follow up and aggressive lifestyle modification.   6. CKD stage 2. Suspected GFR 62 at baseline, will follow on renal function in am, avoid hypotension or nephrotoxic medications.     DVT prophylaxis: enoxparin  Code Status: full Family Communication: I spoke with patient's sister at the bedside and all questions were addressed Disposition Plan: home   Consultants:     Procedures:     Antimicrobials:   Vancomycin   Cefepime    Subjective: Patient is feeling better, pain at the right gluteal region is getting better, no nausea or vomiting, no chest pain or dyspnea. Positive fever yesterday at 1400.    Objective: Vitals:   12/24/17 1751 12/24/17 2044 12/25/17 0300 12/25/17 0539  BP: 130/73 110/64 (!) 150/96 129/80  Pulse: 100 (!) 105 77 86  Resp: 20 19 18 18   Temp: 100 F (37.8 C) 99.6 F (37.6 C) 100.1 F (37.8 C) 99.8 F (37.7 C)  TempSrc: Oral Oral Oral Oral  SpO2: 100% 98% 97% 96%  Weight:      Height:        Intake/Output Summary (Last 24 hours) at 12/25/2017 1146 Last data filed at 12/25/2017 1051 Gross per 24 hour  Intake 2000 ml  Output 2150 ml  Net -150 ml   Filed  Weights   12/20/17 1735  Weight: (!) 152.1 kg (335 lb 5.1 oz)    Examination:   General: Not in pain or dyspnea, deconditioned Neurology: Awake and alert, non focal  E ENT: mild pallor, no icterus, oral mucosa moist Cardiovascular: No JVD. S1-S2 present, rhythmic, no gallops, rubs, or murmurs. No lower extremity edema. Pulmonary: vesicular  breath sounds bilaterally, adequate air movement, no wheezing, rhonchi or rales. Gastrointestinal. Abdomen protuberant, no organomegaly, non tender, no rebound or guarding Skin. Indurated region at the lateral inner aspect of the right gluteus, mild tender to palpation, no fluctuation or drainage. No erythema.  Musculoskeletal: no joint deformities     Data Reviewed: I have personally reviewed following labs and imaging studies  CBC: Recent Labs  Lab 12/20/17 1210 12/20/17 1411 12/22/17 0656 12/22/17 2152 12/24/17 0500  WBC 12.0*  --  14.8* 18.3* 22.7*  NEUTROABS  --   --   --  13.6*  --   HGB 16.6 18.4* 15.3 15.7 12.7*  HCT 50.1 54.0* 46.1 46.7 38.6*  MCV 97.3  --  95.6 94.9 95.3  PLT 287  --  207 218 186   Basic Metabolic Panel: Recent Labs  Lab 12/21/17 0339 12/21/17 0755 12/22/17 0656 12/22/17 2152 12/23/17 0705 12/24/17 0500 12/25/17 0440  NA 147* 146* 138 137  --  137  --   K 4.0 4.6 3.7 3.8  --  3.2*  --   CL 116* 122* 108 107  --  109  --   CO2 21* 14* 19* 20*  --  16*  --   GLUCOSE 194* 153* 388* 333*  --  220*  --   BUN 23* 21* 18 22*  --  17  --   CREATININE 1.33* 1.16 1.48* 1.53* 1.68* 1.79* 1.57*  CALCIUM 9.2 9.1 8.5* 8.7*  --  7.9*  --    GFR: Estimated Creatinine Clearance: 89.8 mL/min (A) (by C-G formula based on SCr of 1.57 mg/dL (H)). Liver Function Tests: Recent Labs  Lab 12/20/17 1356 12/22/17 2152  AST 15 18  ALT 16* 15*  ALKPHOS 183* 146*  BILITOT 1.2 0.8  PROT 8.2* 8.2*  ALBUMIN 3.5 3.4*   Recent Labs  Lab 12/20/17 1356  LIPASE 24   No results for input(s): AMMONIA in the last 168 hours. Coagulation Profile: No results for input(s): INR, PROTIME in the last 168 hours. Cardiac Enzymes: No results for input(s): CKTOTAL, CKMB, CKMBINDEX, TROPONINI in the last 168 hours. BNP (last 3 results) Recent Labs    06/11/17 0916 11/19/17 0941  PROBNP 167.0* 75.0   HbA1C: No results for input(s): HGBA1C in the last 72  hours. CBG: Recent Labs  Lab 12/24/17 0737 12/24/17 1153 12/24/17 1632 12/24/17 2040 12/25/17 0743  GLUCAP 200* 210* 213* 190* 240*   Lipid Profile: No results for input(s): CHOL, HDL, LDLCALC, TRIG, CHOLHDL, LDLDIRECT in the last 72 hours. Thyroid Function Tests: No results for input(s): TSH, T4TOTAL, FREET4, T3FREE, THYROIDAB in the last 72 hours. Anemia Panel: No results for input(s): VITAMINB12, FOLATE, FERRITIN, TIBC, IRON, RETICCTPCT in the last 72 hours.    Radiology Studies: I have reviewed all of the imaging during this hospital visit personally     Scheduled Meds: . acidophilus  1 capsule Oral Daily  . aspirin EC  81 mg Oral Daily  . enoxaparin (LOVENOX) injection  80 mg Subcutaneous Q24H  . Gerhardt's butt cream   Topical QID  . insulin aspart  0-20 Units Subcutaneous TID WC  .  insulin aspart  0-5 Units Subcutaneous QHS  . insulin aspart  12 Units Subcutaneous TID WC  . insulin glargine  30 Units Subcutaneous BID  . metoprolol tartrate  50 mg Oral BID  . simvastatin  20 mg Oral QHS   Continuous Infusions: . ceFEPime (MAXIPIME) IV Stopped (12/25/17 0341)  . metronidazole 500 mg (12/25/17 0540)  . sodium chloride Stopped (12/23/17 0329)  . sodium chloride Stopped (12/22/17 2350)  . vancomycin 1,250 mg (12/25/17 1104)     LOS: 5 days        Mauricio Annett Gula, MD [facility] Pager [phone]

## 2017-12-25 NOTE — Progress Notes (Signed)
Inpatient Diabetes Program Recommendations  AACE/ADA: New Consensus Statement on Inpatient Glycemic Control (2015)  Target Ranges:  Prepandial:   less than 140 mg/dL      Peak postprandial:   less than 180 mg/dL (1-2 hours)      Critically ill patients:  140 - 180 mg/dL   Lab Results  Component Value Date   GLUCAP 240 (H) 12/25/2017   HGBA1C 11.0 (H) 12/20/2017    Review of Glycemic Control  FBS 240 mg/dL. Goal < 180 mg/dL. Continue titration of Lantus.   Inpatient Diabetes Program Recommendations:     Increase Lantus to 32 units bid  Continue to follow.  Thank you. Ailene Ards, RD, LDN, CDE Inpatient Diabetes Coordinator [phone]

## 2017-12-25 NOTE — Progress Notes (Signed)
Pharmacy Antibiotic Note  Luis [name] is a 52 y.o. male admitted on 12/20/2017 with sepsis.  Pharmacy has been consulted for zosyn and vancomycin dosing. On 2/21, due to rising SCr, Zosyn was changed to cefepime/flagyl and vanc continued.   Plan: 1) Vanc level <10 - last dose of 2g vanc given 2/21 at 0100.  2) Per current renal function, restart vancomycin at dose of 1250mg  IV q12 (goal AUC 400-500) 3) Continue cefepime 2g IV q12 4) Daily SCr   Height: 6\' 3"  (190.5 cm) Weight: (!) 335 lb 5.1 oz (152.1 kg) IBW/kg (Calculated) : 84.5  Temp (24hrs), Avg:99.9 F (37.7 C), Min:98.9 F (37.2 C), Max:100.8 F (38.2 C)  Recent Labs  Lab 12/20/17 1210  12/22/17 0656 12/22/17 2152 12/22/17 2357 12/23/17 0705 12/24/17 0500 12/25/17 0440  WBC 12.0*  --  14.8* 18.3*  --   --  22.7*  --   CREATININE  --    < > 1.48* 1.53*  --  1.68* 1.79* 1.57*  LATICACIDVEN  --   --   --  3.1* 1.6  --   --   --   VANCORANDOM  --   --   --   --   --   --   --  7   < > = values in this interval not displayed.    Estimated Creatinine Clearance: 89.8 mL/min (A) (by C-G formula based on SCr of 1.57 mg/dL (H)).    Allergies  Allergen Reactions  . Demerol [Meperidine] Itching  . Morphine And Related Itching    Antimicrobials this admission: 2/19 zosyn >> 2/21 2/19 vancomycin >>  2/21 cefepime >>  2/21 Flagyl >>  Thank you for allowing pharmacy to be a part of this patient's care.  Hessie Knows, PharmD, BCPS 12/25/2017 8:30 AM

## 2017-12-25 NOTE — Progress Notes (Signed)
Pt had a pleasant night with no complaints, however, his sister came in this AM agressively raising her voice stating that pt was being bullied and not receiving adequate care.   During the night, this RN and pt discussed the importance of being his own advocate when it comes to making decisions regarding his care in regards to surgery, medications, etc. During the process of hanging an IV antibiotic, we discussed the importance of giving the IV antibiotics time to work to see if the infection decreased and then the MD's would be able to determine if more were needed or if they would recommend other alternatives, such as surgery or I & D. Pt thanked this RN, stating that he had previously felt pressured in to having surgery when he did not want to and was appreciative of being told that he was being his own advocate.  Pt sister questioned RN this AM, stating that the IV antibiotics would work and that nothing should have been said as everything had already been discussed with MD. She continued to yell at RN stating that pt did not get anything to eat or drink the night before and that was a form of bullying. RN explained that she could not speak for what happened prior to the shift and recommended her speak to management regarding her concerns and complaints. This RN apologized if something she said was taken out of context and explained that pt seemed appreciative, not defensive or upset and that she was taken back by her accusations and anger regarding the prior comments.

## 2017-12-26 DIAGNOSIS — E669 Obesity, unspecified: Secondary | ICD-10-CM

## 2017-12-26 DIAGNOSIS — I10 Essential (primary) hypertension: Secondary | ICD-10-CM

## 2017-12-26 DIAGNOSIS — E1169 Type 2 diabetes mellitus with other specified complication: Secondary | ICD-10-CM

## 2017-12-26 DIAGNOSIS — I5032 Chronic diastolic (congestive) heart failure: Secondary | ICD-10-CM

## 2017-12-26 LAB — GLUCOSE, CAPILLARY: GLUCOSE-CAPILLARY: 176 mg/dL — AB (ref 65–99)

## 2017-12-26 MED ORDER — LIVING WELL WITH DIABETES BOOK
Freq: Once | Status: AC
  Administered 2017-12-26: 12:00:00
  Filled 2017-12-26: qty 1

## 2017-12-26 MED ORDER — GERHARDT'S BUTT CREAM: 1.0000 "application " | TOPICAL_CREAM | Freq: Four times a day (QID) | CUTANEOUS | 0 refills | Status: DC

## 2017-12-26 MED ORDER — BLOOD GLUCOSE METER KIT: PACK | 0 refills | Status: DC

## 2017-12-26 MED ORDER — CEPHALEXIN 500 MG PO CAPS: 500.0000 mg | ORAL_CAPSULE | Freq: Four times a day (QID) | ORAL | 0 refills | Status: DC

## 2017-12-26 MED ORDER — INSULIN ASPART PROT & ASPART (70-30 MIX) 100 UNIT/ML ~~LOC~~ SUSP
45.0000 [IU] | Freq: Two times a day (BID) | SUBCUTANEOUS | Status: DC
  Filled 2017-12-26: qty 10

## 2017-12-26 MED ORDER — CEPHALEXIN 500 MG PO CAPS
500.0000 mg | ORAL_CAPSULE | Freq: Four times a day (QID) | ORAL | Status: DC
  Administered 2017-12-26: 500 mg via ORAL
  Filled 2017-12-26: qty 1

## 2017-12-26 MED ORDER — SYRINGE 10-12 ML 12 ML MISC: 1.0000 [IU] | Freq: Four times a day (QID) | 0 refills | Status: AC

## 2017-12-26 MED ORDER — LIVING WELL WITH DIABETES BOOK
Freq: Once | Status: DC
  Filled 2017-12-26: qty 1

## 2017-12-26 MED ORDER — INSULIN ASPART PROT & ASPART (70-30 MIX) 100 UNIT/ML ~~LOC~~ SUSP: 40.0000 [IU] | Freq: Two times a day (BID) | SUBCUTANEOUS | Status: DC

## 2017-12-26 MED ORDER — INSULIN ASPART PROT & ASPART (70-30 MIX) 100 UNIT/ML ~~LOC~~ SUSP: 45.0000 [IU] | Freq: Two times a day (BID) | SUBCUTANEOUS | 11 refills | Status: DC

## 2017-12-26 NOTE — Discharge Summary (Addendum)
Physician Discharge Summary  Luis [name] PXT:[id number] DOB: 06-May-1966 DOA: 12/20/2017  PCP: Briscoe Deutscher, DO  Admit date: 12/20/2017 Discharge date: 12/26/2017  Admitted From: Home Disposition:  Home  Recommendations for Outpatient Follow-up and new medication changes:  1. Follow up with PCP in 1- week 2. Patient has been placed on Cephalexin for non purulent gluteal cellulitis 3. Insulin regimen has been changed to insulin 70/30, patient unable to afford Lantus.  4. Will stop dulaglutide for now.   Home Health: no  Equipment/Devices: no   Discharge Condition: stable CODE STATUS: full  Diet recommendation: Heart healthy and diabetic prudent  Brief/Interim Summary: 52 year old male who presented fatigue and dehydration.  He does have a significant past medical history for hypertension, congestive heart failure, obesity and type 2 diabetes mellitus.  Patient presents with 1-2 weeks of worsening generalized weakness, associated with polydipsia, polyuria and diarrhea.  On the initial physical examination blood pressure 134/83, heart rate 103, respiratory rate 18, oxygen saturation 96%, dry mucous membranes, heart S1-S2 present, rhythmic, lungs were clear to auscultation bilaterally, abdomen soft nontender, no lower extremity edema.  Sodium 135, potassium 5.1, chloride 104, bicarb 13, glucose 781, BUN 30, creatinine 1.68, calcium 8.2, anion gap 18, white count 14.8, hemoglobin 15.3, hematocrit 46.1, platelets 207.  Urinalysis negative for infection.  CT of the abdomen with findings suspicious for small abscess of phlegmon in the subcutaneous fat of the medial right buttock region overlying soft tissue stranding which may reflect associated cellulitis.  Chest x-ray hypoinflated with no infiltrates.  Sinus rhythm EKG 101 bpm, lateral T wave inversions, normal intervals, normal axis.  Patient was admitted to the hospital with the working diagnosis of diabetes ketoacidosis, complicated by right  gluteal cellulitis.   1.  Diabetes ketoacidosis.  Patient was admitted to the stepdown unit, he was placed on continuous IV infusion of insulin, IV fluids, close monitoring of electrolytes and anion gap.  He responded well to medical therapy, anion gap closed and he was successfully transitioned to subcutaneous insulin, he has been on 30 units twice daily insulin glargine, and insulin aspart 12 units before meals.  2.  Type 2 diabetes mellitus (not type 1 DM).  Patient will need close follow-up as an outpatient, insulin regimen has been changed to insulin 70/30 to improve patient compliance, apparently he was unable to afford insulin Lantus, will continue diabetic prudent diet.  3.  Right gluteal cellulitis complicated by sepsis. (present on admission).  Patient was placed on IV broad-spectrum antibiotic therapy with good response.  Surgery evaluation was requested, no clinical signs of abscess, recommendations to continue antibiotic therapy.  Patient will continue antibiotic therapy with cephalexin for the next 7 days with a close follow-up as an outpatient  4.  Chronic kidney disease stage II.  Patient received isotonic fluids, kidney function remains stable with a serum creatinine 1.5-1.7.  Discharge potassium is 3.5. and serum bicarbonate 20.  Will need close follow-up as an outpatient, continue diuretic therapy with torsemide.  5.  Diastolic heart failure.  Left ventricle ejection fraction 50-55%, no signs of acute decompensation, patient will resume diuretic therapy at discharge.  Continue blood pressure control.  Continue beta-blockade with metoprolol.   6.  Hypertension.  Blood pressure remained well controlled, continue metoprolol.  7.  Morbid obesity.  Calculated BMI 41.9, continue lifestyle modification, follow-up as an outpatient.  No documented upper extremity pressure ulcers.   Discharge Diagnoses:  Active Problems:   Essential hypertension   Diabetes mellitus type 2 in obese  (  [place])   Diarrhea   DKA (diabetic ketoacidoses) (HCC)   Pressure injury of skin   Fever   Cellulitis    Discharge Instructions   Allergies as of 12/26/2017      Reactions   Demerol [meperidine] Itching   Morphine And Related Itching      Medication List    STOP taking these medications   Dulaglutide 1.5 MG/0.5ML Sopn Commonly known as:  TRULICITY     TAKE these medications   10-12CC SYRINGE 12 ML Misc 1 Units by Does not apply route 4 (four) times daily. Insulin syringes with needles.   albuterol 108 (90 Base) MCG/ACT inhaler Commonly known as:  PROVENTIL HFA;VENTOLIN HFA Inhale 2 puffs into the lungs every 4 (four) hours as needed for wheezing.   ALKA-SELTZER PLUS COLD & COUGH 03-04-09-325 MG Caps Generic drug:  Phenyleph-CPM-DM-APAP Take 1 tablet by mouth daily.   aspirin 81 MG EC tablet Take 1 tablet (81 mg total) by mouth daily.   blood glucose meter kit and supplies Dispense based on patient and insurance preference. Use up to four times daily as directed. (FOR ICD-10 E10.9, E11.9).   cephALEXin 500 MG capsule Commonly known as:  KEFLEX Take 1 capsule (500 mg total) by mouth every 6 (six) hours for 7 days.   Gerhardt's butt cream Crea Apply 1 application topically 4 (four) times daily.   insulin aspart protamine- aspart (70-30) 100 UNIT/ML injection Commonly known as:  NOVOLOG MIX 70/30 Inject 0.45 mLs (45 Units total) into the skin 2 (two) times daily with a meal.   loperamide 2 MG capsule Commonly known as:  IMODIUM Take 1 capsule (2 mg total) by mouth 4 (four) times daily as needed for diarrhea or loose stools.   losartan 25 MG tablet Commonly known as:  COZAAR Take 1 tablet (25 mg total) by mouth daily.   Metoprolol Tartrate 37.5 MG Tabs Take 37.5 mg by mouth 2 (two) times daily.   simvastatin 20 MG tablet Commonly known as:  ZOCOR Take 1 tablet (20 mg total) by mouth at bedtime.   torsemide 10 MG tablet Commonly known as:  DEMADEX Take 1 -  2 tablets daily for swelling   traZODone 50 MG tablet Commonly known as:  DESYREL Take 0.5-1 tablets (25-50 mg total) by mouth at bedtime as needed for sleep.       Allergies  Allergen Reactions  . Demerol [Meperidine] Itching  . Morphine And Related Itching    Consultations:  Surgery   Procedures/Studies: Ct Abdomen Pelvis Wo Contrast  Result Date: 12/23/2017 CLINICAL DATA:  52 year old male with history of red, raised, foul smelling spot in his left inner groin. EXAM: CT ABDOMEN AND PELVIS WITHOUT CONTRAST TECHNIQUE: Multidetector CT imaging of the abdomen and pelvis was performed following the standard protocol without IV contrast. COMPARISON:  No priors. FINDINGS: Lower chest: Small amount of scarring or subsegmental atelectasis in the right lower lobe. Hepatobiliary: Severe diffuse low attenuation throughout the hepatic parenchyma, compatible with a background of severe hepatic steatosis. No definite cystic or solid hepatic lesions are confidently identified on today's noncontrast CT examination. Unenhanced appearance of the gallbladder is normal. Pancreas: No definite pancreatic mass or peripancreatic fluid or inflammatory changes are noted on today's noncontrast CT examination. Spleen: Unremarkable. Adrenals/Urinary Tract: Unenhanced appearance of the kidneys and bilateral adrenal glands is normal. No hydroureteronephrosis. Urinary bladder is normal in appearance. Stomach/Bowel: Unenhanced appearance of the stomach is normal. No pathologic dilatation of small bowel or colon. Normal appendix.  Vascular/Lymphatic: Small amount of atherosclerotic calcifications noted in the pelvic vasculature. No definite aneurysm identified in the abdominal or pelvic vasculature. No lymphadenopathy noted in the abdomen or pelvis. Reproductive: Prostate gland and seminal vesicles are unremarkable in appearance. Other: No significant volume of ascites. No pneumoperitoneum. In the subcutaneous fat in the  medial aspect of the right buttock region there is a focal area of low attenuation measuring 2.4 x 3.3 cm (axial image 99 of series 2) with extensive surrounding fat stranding, suspicious for a small abscess or phlegmon. Musculoskeletal: Orthopedic fixation hardware in the right proximal femur incompletely imaged. There are no aggressive appearing lytic or blastic lesions noted in the visualized portions of the skeleton. IMPRESSION: 1. Findings are suspicious for small abscess or phlegmon in the subcutaneous fat of the medial right buttock region, with overlying soft tissue stranding which may reflect an associated cellulitis. 2. No other acute findings are noted elsewhere in the abdomen or pelvis. 3. Normal appendix. 4. Severe hepatic steatosis. Electronically Signed   By: Vinnie Langton M.D.   On: 12/23/2017 16:10   Dg Chest Port 1 View  Result Date: 12/22/2017 CLINICAL DATA:  Fever and shortness of breath EXAM: PORTABLE CHEST 1 VIEW COMPARISON:  12/20/2017, 11/19/2016 FINDINGS: Streaky atelectasis or minimal infiltrate at the left base. No pleural effusion. Right lung clear. Stable cardiomediastinal silhouette. No pneumothorax. IMPRESSION: Streaky atelectasis or minimal infiltrate at the left lung base. Electronically Signed   By: Donavan Foil M.D.   On: 12/22/2017 22:03   Dg Chest Portable 1 View  Result Date: 12/20/2017 CLINICAL DATA:  Weakness EXAM: PORTABLE CHEST 1 VIEW COMPARISON:  11/19/2017 FINDINGS: 1410 hours. The lungs are clear without focal pneumonia, edema, pneumothorax or pleural effusion. The cardiopericardial silhouette is within normal limits for size. The visualized bony structures of the thorax are intact. IMPRESSION: No active disease. Electronically Signed   By: Misty Stanley M.D.   On: 12/20/2017 14:40       Subjective: Patient feeling better, no nausea or vomiting, has been afebrile, and tolerating po well.   Discharge Exam: Vitals:   12/25/17 1539 12/26/17 0510  BP:  (!) 142/76 132/83  Pulse: 77 97  Resp: 18 18  Temp: 99.3 F (37.4 C) 98.9 F (37.2 C)  SpO2: 98% 97%   Vitals:   12/25/17 0300 12/25/17 0539 12/25/17 1539 12/26/17 0510  BP: (!) 150/96 129/80 (!) 142/76 132/83  Pulse: 77 86 77 97  Resp: _0 Temp: 100.1 F (37.8 C) 99.8 F (37.7 C) 99.3 F (37.4 C) 98.9 F (37.2 C)  TempSrc: Oral Oral Oral Oral  SpO2: 97% 96% 98% 97%  Weight:      Height:        General: Not in pain or dyspnea,  Neurology: Awake and alert, non focal  E ENT: no pallor, no icterus, oral mucosa moist Cardiovascular: No JVD. S1-S2 present, rhythmic, no gallops, rubs, or murmurs. No lower extremity edema. Pulmonary: vesicular breath sounds bilaterally, adequate air movement, no wheezing, rhonchi or rales. Gastrointestinal. Abdomen protuberant, no organomegaly, non tender, no rebound or guarding Skin. Right gluteal induration, no erythema, no increase local temperature and no fluctation Musculoskeletal: no joint deformities   The results of significant diagnostics from this hospitalization (including imaging, microbiology, ancillary and laboratory) are listed below for reference.     Microbiology: Recent Results (from the past 240 hour(s))  MRSA PCR Screening     Status: None   Collection Time: 12/20/17  5:37  PM  Result Value Ref Range Status   MRSA by PCR NEGATIVE NEGATIVE Final    Comment:        The GeneXpert MRSA Assay (FDA approved for NASAL specimens only), is one component of a comprehensive MRSA colonization surveillance program. It is not intended to diagnose MRSA infection nor to guide or monitor treatment for MRSA infections. Performed at [facility], [place] [address] [place], [place] [address]   Culture, blood (Routine X 2) w Reflex to ID Panel     Status: None (Preliminary result)   Collection Time: 12/22/17 10:19 AM  Result Value Ref Range Status   Specimen Description   Final    BLOOD RIGHT HAND Performed  at [facility] [address] [place], [place] [address]    Special Requests   Final    IN PEDIATRIC BOTTLE Blood Culture adequate volume Performed at [facility] [address] [place], [place] [address]    Culture   Final    NO GROWTH 3 DAYS Performed at [facility], [place] [address], [place], [place] [address]    Report Status PENDING  Incomplete  Culture, blood (Routine X 2) w Reflex to ID Panel     Status: None (Preliminary result)   Collection Time: 12/22/17 10:19 AM  Result Value Ref Range Status   Specimen Description   Final    BLOOD LEFT HAND Performed at [facility] [address], [place], [place] [address]    Special Requests   Final    IN PEDIATRIC BOTTLE Blood Culture adequate volume Performed at [facility] [address] [place], [place] [address]    Culture   Final    NO GROWTH 3 DAYS Performed at [facility], [place] [address], [place], [place] [address]    Report Status PENDING  Incomplete     Labs: BNP (last 3 results) Recent Labs    03/01/17 1840 03/15/17 0701  BNP 243.5* 841.6*   Basic Metabolic Panel: Recent Labs  Lab 12/21/17 0755 12/22/17 0656 12/22/17 2152 12/23/17 0705 12/24/17 0500 12/25/17 0440 12/25/17 1457  NA 146* 138 137  --  137  --  136  K 4.6 3.7 3.8  --  3.2*  --  3.5  CL 122* 108 107  --  109  --  105  CO2 14* 19* 20*  --  16*  --  20*  GLUCOSE 153* 388* 333*  --  220*  --  340*  BUN 21* 18 22*  --  17  --  13  CREATININE 1.16 1.48* 1.53* 1.68* 1.79* 1.57* 1.61*  CALCIUM 9.1 8.5* 8.7*  --  7.9*  --  7.9*   Liver Function Tests: Recent Labs  Lab 12/20/17 1356 12/22/17 2152  AST 15 18  ALT 16* 15*  ALKPHOS 183* 146*  BILITOT 1.2 0.8  PROT 8.2* 8.2*  ALBUMIN 3.5 3.4*   Recent Labs  Lab 12/20/17 1356  LIPASE 24   No results for input(s): AMMONIA in the last 168 hours. CBC: Recent Labs  Lab 12/20/17 1210  12/20/17 1411 12/22/17 0656 12/22/17 2152 12/24/17 0500 12/25/17 1457  WBC 12.0*  --  14.8* 18.3* 22.7* 14.9*  NEUTROABS  --   --   --  13.6*  --  11.8*  HGB 16.6 18.4* 15.3 15.7 12.7* 12.6*  HCT 50.1 54.0* 46.1 46.7 38.6* 38.4*  MCV 97.3  --  95.6 94.9 95.3  93.9  PLT 287  --  207 218 186 210   Cardiac Enzymes: No results for input(s): CKTOTAL, CKMB, CKMBINDEX, TROPONINI in the last 168 hours. BNP: Invalid input(s): POCBNP CBG: Recent Labs  Lab 12/25/17 1154 12/25/17 1406 12/25/17 1708 12/25/17 2147 12/26/17 0755  GLUCAP 279* 309* 248* 156* 176*   D-Dimer No results for input(s): DDIMER in the last 72 hours. Hgb A1c No results for input(s): HGBA1C in the last 72 hours. Lipid Profile No results for input(s): CHOL, HDL, LDLCALC, TRIG, CHOLHDL, LDLDIRECT in the last 72 hours. Thyroid function studies No results for input(s): TSH, T4TOTAL, T3FREE, THYROIDAB in the last 72 hours.  Invalid input(s): FREET3 Anemia work up No results for input(s): VITAMINB12, FOLATE, FERRITIN, TIBC, IRON, RETICCTPCT in the last 72 hours. Urinalysis    Component Value Date/Time   COLORURINE STRAW (A) 12/20/2017 1150   APPEARANCEUR CLEAR 12/20/2017 1150   LABSPEC 1.028 12/20/2017 1150   PHURINE 5.0 12/20/2017 1150   GLUCOSEU >=500 (A) 12/20/2017 1150   HGBUR SMALL (A) 12/20/2017 1150   BILIRUBINUR NEGATIVE 12/20/2017 1150   KETONESUR 80 (A) 12/20/2017 1150   PROTEINUR NEGATIVE 12/20/2017 1150   NITRITE NEGATIVE 12/20/2017 1150   LEUKOCYTESUR NEGATIVE 12/20/2017 1150   Sepsis Labs Invalid input(s): PROCALCITONIN,  WBC,  LACTICIDVEN Microbiology Recent Results (from the past 240 hour(s))  MRSA PCR Screening     Status: None   Collection Time: 12/20/17  5:37 PM  Result Value Ref Range Status   MRSA by PCR NEGATIVE NEGATIVE Final    Comment:        The GeneXpert MRSA Assay (FDA approved for NASAL specimens only), is one component of a comprehensive MRSA colonization surveillance  program. It is not intended to diagnose MRSA infection nor to guide or monitor treatment for MRSA infections. Performed at [facility], [place] [address], [place], [place] [address]   Culture, blood (Routine X 2) w Reflex to ID Panel     Status: None (Preliminary result)   Collection Time: 12/22/17 10:19 AM  Result Value Ref Range Status   Specimen Description   Final    BLOOD RIGHT HAND Performed at [facility] [address], [place], [place] [address]    Special Requests   Final    IN PEDIATRIC BOTTLE Blood Culture adequate volume Performed at [facility] [address] [place], [place] [address]    Culture   Final    NO GROWTH 3 DAYS Performed at [facility], [place] [address] [place], [place] [address]    Report Status PENDING  Incomplete  Culture, blood (Routine X 2) w Reflex to ID Panel     Status: None (Preliminary result)   Collection Time: 12/22/17 10:19 AM  Result Value Ref Range Status   Specimen Description   Final    BLOOD LEFT HAND Performed at [facility] [address], [place], [place] [address]    Special Requests   Final    IN PEDIATRIC BOTTLE Blood Culture adequate volume Performed at [facility] [address] [place], [place] [address]    Culture   Final    NO GROWTH 3 DAYS Performed at [facility], [place] [address], [place], [place] [address]    Report Status PENDING  Incomplete     Time coordinating discharge: 45 minutes  SIGNED:   Tawni Millers, MD  [facility] 12/26/2017, 9:41 AM Pager [phone]  If  7PM-7AM, please contact night-coverage [email or web address] Password [email or web address]

## 2017-12-26 NOTE — Progress Notes (Signed)
Patient declines am labs

## 2017-12-27 LAB — CULTURE, BLOOD (ROUTINE X 2)
Culture: NO GROWTH
Culture: NO GROWTH
Special Requests: ADEQUATE
Special Requests: ADEQUATE

## 2017-12-28 ENCOUNTER — Telehealth: Payer: Self-pay | Admitting: *Deleted

## 2017-12-28 NOTE — Telephone Encounter (Signed)
Called patient he was not able to get the 70/30 but script was changed by someone and was able to get generic at [facility]. He was not able to tell me what it was in other room and he did not want to get up right that minute. He will call with that information later today and leave message.

## 2017-12-28 NOTE — Telephone Encounter (Addendum)
Per chart review: Admit date: 12/20/2017 Discharge date: 12/26/2017  Admitted From: Home Disposition:  Home  Recommendations for Outpatient Follow-up and new medication changes:  1. Follow up with PCP in 1- week 2. Patient has been placed on Cephalexin for non purulent gluteal cellulitis 3. Insulin regimen has been changed to insulin 70/30, patient unable to afford Lantus.  4. Will stop dulaglutide for now.   Home Health: no  Equipment/Devices: no   Discharge Condition: stable CODE STATUS: full  Diet recommendation: Heart healthy and diabetic prudent  _________________________________________________________________________ Per telephone call:  Transition Care Management Follow-up Telephone Call   Date discharged? 12/26/17   How have you been since you were released from the hospital? "much better"   Do you understand why you were in the hospital? yes   Do you understand the discharge instructions? yes   Where were you discharged to? Home   Items Reviewed:  Medications reviewed: yes. Patient is not taking Simvastatin or Losartan, he states that one of them gave him diarrhea.  Allergies reviewed: yes  Dietary changes reviewed: yes  Referrals reviewed: yes   Functional Questionnaire:   Activities of Daily Living (ADLs):   He states they are independent in the following: ambulation, bathing and hygiene, feeding, continence, grooming, toileting and dressing States they require assistance with the following: none   Any transportation issues/concerns?: yes. Patient relies on his sister for rides to appointments.    Any patient concerns? no   Confirmed importance and date/time of follow-up visits scheduled no  Patient will back after he talks with his sister. He can only come in on Thursdays. I explained that that is Dr Philis Pique half day and she is booked this coming Thursday so he may see Jarold Motto PA or another provider in the office.  Confirmed  with patient if condition begins to worsen call PCP or go to the ER.  Patient was given the office number and encouraged to call back with question or concerns.  : yes

## 2017-12-28 NOTE — Telephone Encounter (Signed)
Per [facility] Call:  Caller states her brother got discharged today. Prescription for insulin was called in (Novolog mix 70/30), but it is too expensive., not covered by insurance. He needs a different insulin called in that will be covered. He si to start takig it 2x daily starting tomorrow. Would prefer to have it changed to a pen and not the needle. No symptoms now. Was using Trulicity; is to stop Trulicity and start [employer]. Caller does not know if patient has any method of checking his blood sugar.   Advised caller to do 3 things. 1 - call insurance and find out what insulin is covered; 2 - check with pharmacy to see if there are copay assistance programs with the Novolog; 3 - have patient seen at [facility] to see about getting a different insulin prescribed. Client directives state "Do not call for meds after hours." Patient is not symptomatic at this time. Caller verbalized understanding and said she would do all of the above. Advised her to call us back if patient began to feel bad or if his blood sugar was elevated.

## 2017-12-29 NOTE — Telephone Encounter (Signed)
Mary from [facility] disabilities calling to see if provider had gotten fax or have faxed over patients forms please call at [phone] reference [phone] or fax the forms back

## 2017-12-29 NOTE — Telephone Encounter (Signed)
Placed in green folder for review 

## 2017-12-30 NOTE — Telephone Encounter (Signed)
As discussed

## 2017-12-30 NOTE — Telephone Encounter (Signed)
Per previous phone message Mr. Snowball sister was going to have the hospital help her fill out the forms for patient.  I have left a message on patient sister voicemail to see if the hospital filled out the forms.

## 2018-01-01 ENCOUNTER — Ambulatory Visit (INDEPENDENT_AMBULATORY_CARE_PROVIDER_SITE_OTHER)
Admission: RE | Admit: 2018-01-01 | Discharge: 2018-01-01 | Disposition: A | Discharge status: Home / Self Care | Payer: BLUE CROSS/BLUE SHIELD | Source: Ambulatory Visit | Attending: Family Medicine | Admitting: Family Medicine

## 2018-01-01 ENCOUNTER — Inpatient Hospital Stay (HOSPITAL_COMMUNITY)
Admission: EM | Admit: 2018-01-01 | Discharge: 2018-01-04 | DRG: 603 | Disposition: A | Discharge status: Home / Self Care | Payer: BLUE CROSS/BLUE SHIELD | Attending: Family Medicine | Admitting: Family Medicine

## 2018-01-01 ENCOUNTER — Encounter: Payer: Self-pay | Admitting: Family Medicine

## 2018-01-01 ENCOUNTER — Encounter (HOSPITAL_COMMUNITY): Payer: Self-pay

## 2018-01-01 ENCOUNTER — Other Ambulatory Visit: Payer: Self-pay

## 2018-01-01 ENCOUNTER — Telehealth: Payer: Self-pay | Admitting: Surgical

## 2018-01-01 ENCOUNTER — Ambulatory Visit (INDEPENDENT_AMBULATORY_CARE_PROVIDER_SITE_OTHER): Payer: BLUE CROSS/BLUE SHIELD | Admitting: Family Medicine

## 2018-01-01 VITALS — BP 118/80 | HR 85 | Temp 97.7°F | Ht 75.0 in | Wt 345.2 lb

## 2018-01-01 DIAGNOSIS — E119 Type 2 diabetes mellitus without complications: Secondary | ICD-10-CM

## 2018-01-01 DIAGNOSIS — I5032 Chronic diastolic (congestive) heart failure: Secondary | ICD-10-CM | POA: Diagnosis not present

## 2018-01-01 DIAGNOSIS — I428 Other cardiomyopathies: Secondary | ICD-10-CM | POA: Diagnosis present

## 2018-01-01 DIAGNOSIS — Z885 Allergy status to narcotic agent status: Secondary | ICD-10-CM

## 2018-01-01 DIAGNOSIS — I482 Chronic atrial fibrillation: Secondary | ICD-10-CM | POA: Diagnosis not present

## 2018-01-01 DIAGNOSIS — E1110 Type 2 diabetes mellitus with ketoacidosis without coma: Secondary | ICD-10-CM

## 2018-01-01 DIAGNOSIS — L03317 Cellulitis of buttock: Secondary | ICD-10-CM | POA: Insufficient documentation

## 2018-01-01 DIAGNOSIS — Z794 Long term (current) use of insulin: Secondary | ICD-10-CM

## 2018-01-01 DIAGNOSIS — Z9989 Dependence on other enabling machines and devices: Secondary | ICD-10-CM | POA: Diagnosis not present

## 2018-01-01 DIAGNOSIS — I130 Hypertensive heart and chronic kidney disease with heart failure and stage 1 through stage 4 chronic kidney disease, or unspecified chronic kidney disease: Secondary | ICD-10-CM | POA: Diagnosis present

## 2018-01-01 DIAGNOSIS — Z9119 Patient's noncompliance with other medical treatment and regimen: Secondary | ICD-10-CM

## 2018-01-01 DIAGNOSIS — E785 Hyperlipidemia, unspecified: Secondary | ICD-10-CM | POA: Diagnosis present

## 2018-01-01 DIAGNOSIS — I152 Hypertension secondary to endocrine disorders: Secondary | ICD-10-CM

## 2018-01-01 DIAGNOSIS — E1122 Type 2 diabetes mellitus with diabetic chronic kidney disease: Secondary | ICD-10-CM | POA: Diagnosis present

## 2018-01-01 DIAGNOSIS — Z6841 Body Mass Index (BMI) 40.0 and over, adult: Secondary | ICD-10-CM | POA: Diagnosis not present

## 2018-01-01 DIAGNOSIS — E669 Obesity, unspecified: Secondary | ICD-10-CM

## 2018-01-01 DIAGNOSIS — Z5320 Procedure and treatment not carried out because of patient's decision for unspecified reasons: Secondary | ICD-10-CM | POA: Diagnosis not present

## 2018-01-01 DIAGNOSIS — E1169 Type 2 diabetes mellitus with other specified complication: Secondary | ICD-10-CM

## 2018-01-01 DIAGNOSIS — N289 Disorder of kidney and ureter, unspecified: Secondary | ICD-10-CM

## 2018-01-01 DIAGNOSIS — B351 Tinea unguium: Secondary | ICD-10-CM | POA: Diagnosis not present

## 2018-01-01 DIAGNOSIS — Z7982 Long term (current) use of aspirin: Secondary | ICD-10-CM

## 2018-01-01 DIAGNOSIS — N183 Chronic kidney disease, stage 3 (moderate): Secondary | ICD-10-CM | POA: Diagnosis not present

## 2018-01-01 DIAGNOSIS — L0231 Cutaneous abscess of buttock: Secondary | ICD-10-CM

## 2018-01-01 DIAGNOSIS — F329 Major depressive disorder, single episode, unspecified: Secondary | ICD-10-CM | POA: Diagnosis present

## 2018-01-01 DIAGNOSIS — E861 Hypovolemia: Secondary | ICD-10-CM | POA: Diagnosis present

## 2018-01-01 DIAGNOSIS — Z87891 Personal history of nicotine dependence: Secondary | ICD-10-CM | POA: Diagnosis not present

## 2018-01-01 DIAGNOSIS — E876 Hypokalemia: Secondary | ICD-10-CM | POA: Diagnosis present

## 2018-01-01 DIAGNOSIS — Z09 Encounter for follow-up examination after completed treatment for conditions other than malignant neoplasm: Secondary | ICD-10-CM | POA: Diagnosis not present

## 2018-01-01 DIAGNOSIS — Z79899 Other long term (current) drug therapy: Secondary | ICD-10-CM

## 2018-01-01 DIAGNOSIS — E7800 Pure hypercholesterolemia, unspecified: Secondary | ICD-10-CM

## 2018-01-01 DIAGNOSIS — G4733 Obstructive sleep apnea (adult) (pediatric): Secondary | ICD-10-CM | POA: Diagnosis not present

## 2018-01-01 DIAGNOSIS — I10 Essential (primary) hypertension: Secondary | ICD-10-CM | POA: Diagnosis not present

## 2018-01-01 DIAGNOSIS — R102 Pelvic and perineal pain: Secondary | ICD-10-CM | POA: Diagnosis not present

## 2018-01-01 DIAGNOSIS — E1159 Type 2 diabetes mellitus with other circulatory complications: Secondary | ICD-10-CM

## 2018-01-01 DIAGNOSIS — R000 Tachycardia, unspecified: Secondary | ICD-10-CM | POA: Diagnosis present

## 2018-01-01 DIAGNOSIS — M791 Myalgia, unspecified site: Secondary | ICD-10-CM | POA: Diagnosis not present

## 2018-01-01 DIAGNOSIS — Z91199 Patient's noncompliance with other medical treatment and regimen due to unspecified reason: Secondary | ICD-10-CM

## 2018-01-01 LAB — CBC WITH DIFFERENTIAL/PLATELET
Basophils Absolute: 0.1 10*3/uL (ref 0.0–0.1)
Basophils Absolute: 0 10*3/uL (ref 0.0–0.1)
Basophils Relative: 1 % (ref 0.0–3.0)
Basophils Relative: 0 %
EOS ABS: 0.2 10*3/uL (ref 0.0–0.7)
Eosinophils Absolute: 0.2 10*3/uL (ref 0.0–0.7)
Eosinophils Relative: 1.8 % (ref 0.0–5.0)
Eosinophils Relative: 2 %
HCT: 41.9 % (ref 39.0–52.0)
HEMATOCRIT: 44.5 % (ref 39.0–52.0)
HEMOGLOBIN: 14.7 g/dL (ref 13.0–17.0)
Hemoglobin: 14.2 g/dL (ref 13.0–17.0)
LYMPHS ABS: 3.8 10*3/uL (ref 0.7–4.0)
LYMPHS PCT: 29 %
Lymphocytes Relative: 23.7 % (ref 12.0–46.0)
Lymphs Abs: 3 10*3/uL (ref 0.7–4.0)
MCH: 31.4 pg (ref 26.0–34.0)
MCHC: 33.9 g/dL (ref 30.0–36.0)
MCHC: 33 g/dL (ref 30.0–36.0)
MCV: 92.9 fl (ref 78.0–100.0)
MCV: 95.1 fL (ref 78.0–100.0)
MONOS PCT: 4 %
Monocytes Absolute: 0.5 10*3/uL (ref 0.1–1.0)
Monocytes Absolute: 0.5 10*3/uL (ref 0.1–1.0)
Monocytes Relative: 4.3 % (ref 3.0–12.0)
NEUTROS PCT: 65 %
Neutro Abs: 8.8 10*3/uL — ABNORMAL HIGH (ref 1.4–7.7)
Neutro Abs: 8.9 10*3/uL — ABNORMAL HIGH (ref 1.7–7.7)
Neutrophils Relative %: 69.2 % (ref 43.0–77.0)
Platelets: 390 10*3/uL (ref 150.0–400.0)
Platelets: 373 10*3/uL (ref 150–400)
RBC: 4.51 Mil/uL (ref 4.22–5.81)
RBC: 4.68 MIL/uL (ref 4.22–5.81)
RDW: 14.3 % (ref 11.5–15.5)
RDW: 14.6 % (ref 11.5–15.5)
WBC: 12.8 10*3/uL — ABNORMAL HIGH (ref 4.0–10.5)
WBC: 13.4 10*3/uL — ABNORMAL HIGH (ref 4.0–10.5)

## 2018-01-01 LAB — COMPREHENSIVE METABOLIC PANEL
ALT: 28 U/L (ref 0–53)
AST: 23 U/L (ref 0–37)
Albumin: 2.9 g/dL — ABNORMAL LOW (ref 3.5–5.2)
Alkaline Phosphatase: 103 U/L (ref 39–117)
BUN: 13 mg/dL (ref 6–23)
CO2: 24 mEq/L (ref 19–32)
Calcium: 8.5 mg/dL (ref 8.4–10.5)
Chloride: 103 mEq/L (ref 96–112)
Creatinine, Ser: 1.43 mg/dL (ref 0.40–1.50)
GFR: 67.35 mL/min (ref >60.00)
Glucose, Bld: 128 mg/dL — ABNORMAL HIGH (ref 70–99)
Potassium: 3 mEq/L — ABNORMAL LOW (ref 3.5–5.1)
Sodium: 138 mEq/L (ref 135–145)
Total Bilirubin: 0.5 mg/dL (ref 0.2–1.2)
Total Protein: 8.6 g/dL — ABNORMAL HIGH (ref 6.0–8.3)

## 2018-01-01 LAB — BASIC METABOLIC PANEL
Anion gap: 14 (ref 5–15)
BUN: 11 mg/dL (ref 6–20)
CHLORIDE: 102 mmol/L (ref 101–111)
CO2: 21 mmol/L — AB (ref 22–32)
CREATININE: 1.58 mg/dL — AB (ref 0.61–1.24)
Calcium: 8.1 mg/dL — ABNORMAL LOW (ref 8.9–10.3)
GFR calc Af Amer: 58 mL/min — ABNORMAL LOW (ref >60)
GFR calc non Af Amer: 50 mL/min — ABNORMAL LOW (ref >60)
GLUCOSE: 110 mg/dL — AB (ref 65–99)
POTASSIUM: 3.1 mmol/L — AB (ref 3.5–5.1)
Sodium: 137 mmol/L (ref 135–145)

## 2018-01-01 LAB — I-STAT CG4 LACTIC ACID, ED
Lactic Acid, Venous: 2.24 mmol/L (ref 0.5–1.9)
Lactic Acid, Venous: 1.47 mmol/L (ref 0.5–1.9)

## 2018-01-01 LAB — MAGNESIUM: Magnesium: 2 mg/dL (ref 1.5–2.5)

## 2018-01-01 LAB — PHOSPHORUS: Phosphorus: 2.1 mg/dL — ABNORMAL LOW (ref 2.3–4.6)

## 2018-01-01 LAB — GLUCOSE, CAPILLARY: Glucose-Capillary: 144 mg/dL — ABNORMAL HIGH (ref 65–99)

## 2018-01-01 LAB — LACTIC ACID, PLASMA: Lactic Acid, Venous: 1.6 mmol/L (ref 0.5–1.9)

## 2018-01-01 MED ORDER — POTASSIUM CHLORIDE IN NACL 20-0.9 MEQ/L-% IV SOLN
INTRAVENOUS | Status: AC
  Administered 2018-01-01: 23:00:00 via INTRAVENOUS
  Filled 2018-01-01: qty 1000

## 2018-01-01 MED ORDER — METOPROLOL TARTRATE 25 MG PO TABS
37.5000 mg | ORAL_TABLET | Freq: Two times a day (BID) | ORAL | Status: DC
  Administered 2018-01-01 – 2018-01-04 (×6): 37.5 mg via ORAL
  Filled 2018-01-01 (×7): qty 2

## 2018-01-01 MED ORDER — ACETAMINOPHEN 325 MG PO TABS: 650.0000 mg | ORAL_TABLET | Freq: Four times a day (QID) | ORAL | Status: DC | PRN

## 2018-01-01 MED ORDER — SIMVASTATIN 20 MG PO TABS
20.0000 mg | ORAL_TABLET | Freq: Every day | ORAL | Status: DC
  Administered 2018-01-02 – 2018-01-03 (×2): 20 mg via ORAL
  Filled 2018-01-01 (×2): qty 1

## 2018-01-01 MED ORDER — ONDANSETRON HCL 4 MG/2ML IJ SOLN: 4.0000 mg | Freq: Four times a day (QID) | INTRAMUSCULAR | Status: DC | PRN

## 2018-01-01 MED ORDER — VANCOMYCIN HCL 10 G IV SOLR
2500.0000 mg | Freq: Once | INTRAVENOUS | Status: AC
  Administered 2018-01-02: 2500 mg via INTRAVENOUS
  Filled 2018-01-01 (×2): qty 2500

## 2018-01-01 MED ORDER — ONDANSETRON HCL 4 MG PO TABS: 4.0000 mg | ORAL_TABLET | Freq: Four times a day (QID) | ORAL | Status: DC | PRN

## 2018-01-01 MED ORDER — INSULIN ASPART 100 UNIT/ML ~~LOC~~ SOLN: 0.0000 [IU] | Freq: Every day | SUBCUTANEOUS | Status: DC

## 2018-01-01 MED ORDER — VANCOMYCIN HCL 10 G IV SOLR
1750.0000 mg | INTRAVENOUS | Status: DC
  Administered 2018-01-02 – 2018-01-03 (×2): 1750 mg via INTRAVENOUS
  Filled 2018-01-01 (×2): qty 1750

## 2018-01-01 MED ORDER — ASPIRIN EC 81 MG PO TBEC
81.0000 mg | DELAYED_RELEASE_TABLET | Freq: Every day | ORAL | Status: DC
  Administered 2018-01-02 – 2018-01-04 (×3): 81 mg via ORAL
  Filled 2018-01-01 (×3): qty 1

## 2018-01-01 MED ORDER — OXYCODONE-ACETAMINOPHEN 5-325 MG PO TABS
1.0000 | ORAL_TABLET | Freq: Four times a day (QID) | ORAL | Status: DC | PRN
  Administered 2018-01-01: 1 via ORAL
  Filled 2018-01-01: qty 1

## 2018-01-01 MED ORDER — INSULIN DETEMIR 100 UNIT/ML ~~LOC~~ SOLN
25.0000 [IU] | Freq: Two times a day (BID) | SUBCUTANEOUS | Status: DC
  Administered 2018-01-01 – 2018-01-04 (×6): 25 [IU] via SUBCUTANEOUS
  Filled 2018-01-01 (×6): qty 0.25

## 2018-01-01 MED ORDER — INSULIN ASPART 100 UNIT/ML ~~LOC~~ SOLN
0.0000 [IU] | Freq: Three times a day (TID) | SUBCUTANEOUS | Status: DC
  Administered 2018-01-02 (×2): 3 [IU] via SUBCUTANEOUS
  Administered 2018-01-03 (×2): 2 [IU] via SUBCUTANEOUS
  Administered 2018-01-04: 3 [IU] via SUBCUTANEOUS

## 2018-01-01 MED ORDER — OXYCODONE HCL 5 MG PO TABS: 5.0000 mg | ORAL_TABLET | Freq: Four times a day (QID) | ORAL | Status: DC | PRN

## 2018-01-01 MED ORDER — POTASSIUM CHLORIDE CRYS ER 20 MEQ PO TBCR
20.0000 meq | EXTENDED_RELEASE_TABLET | Freq: Once | ORAL | Status: AC
  Administered 2018-01-01: 20 meq via ORAL
  Filled 2018-01-01: qty 1

## 2018-01-01 MED ORDER — TRAZODONE HCL 50 MG PO TABS
25.0000 mg | ORAL_TABLET | Freq: Every evening | ORAL | Status: DC | PRN
Start: 2018-01-01 — End: 2018-01-04

## 2018-01-01 MED ORDER — OXYCODONE-ACETAMINOPHEN 10-325 MG PO TABS: 1.0000 | ORAL_TABLET | Freq: Four times a day (QID) | ORAL | Status: DC | PRN

## 2018-01-01 MED ORDER — VANCOMYCIN HCL IN DEXTROSE 1-5 GM/200ML-% IV SOLN: 1000.0000 mg | Freq: Once | INTRAVENOUS | Status: DC

## 2018-01-01 MED ORDER — HYDROMORPHONE HCL 1 MG/ML IJ SOLN
1.0000 mg | INTRAMUSCULAR | Status: DC | PRN
  Administered 2018-01-02: 1 mg via INTRAVENOUS
  Filled 2018-01-01: qty 1

## 2018-01-01 MED ORDER — ALBUTEROL SULFATE (2.5 MG/3ML) 0.083% IN NEBU: 2.5000 mg | INHALATION_SOLUTION | RESPIRATORY_TRACT | Status: DC | PRN

## 2018-01-01 MED ORDER — OXYCODONE-ACETAMINOPHEN 10-325 MG PO TABS: 1.0000 | ORAL_TABLET | Freq: Three times a day (TID) | ORAL | 0 refills | Status: DC | PRN

## 2018-01-01 MED ORDER — ENOXAPARIN SODIUM 40 MG/0.4ML ~~LOC~~ SOLN
40.0000 mg | SUBCUTANEOUS | Status: DC
  Administered 2018-01-01: 40 mg via SUBCUTANEOUS
  Filled 2018-01-01: qty 0.4

## 2018-01-01 MED ORDER — SODIUM CHLORIDE 0.9 % IV BOLUS (SEPSIS)
1000.0000 mL | Freq: Once | INTRAVENOUS | Status: AC
  Administered 2018-01-01: 1000 mL via INTRAVENOUS

## 2018-01-01 MED ORDER — SENNOSIDES-DOCUSATE SODIUM 8.6-50 MG PO TABS: 1.0000 | ORAL_TABLET | Freq: Every evening | ORAL | Status: DC | PRN

## 2018-01-01 MED ORDER — ALBUTEROL SULFATE HFA 108 (90 BASE) MCG/ACT IN AERS: 2.0000 | INHALATION_SPRAY | RESPIRATORY_TRACT | Status: DC | PRN

## 2018-01-01 MED ORDER — IOPAMIDOL (ISOVUE-300) INJECTION 61%: 100.0000 mL | Freq: Once | INTRAVENOUS | Status: DC | PRN

## 2018-01-01 MED ORDER — GERHARDT'S BUTT CREAM
1.0000 "application " | TOPICAL_CREAM | Freq: Four times a day (QID) | CUTANEOUS | Status: DC
  Administered 2018-01-01 – 2018-01-02 (×2): 1 via TOPICAL
  Filled 2018-01-01: qty 1

## 2018-01-01 MED ORDER — BISACODYL 5 MG PO TBEC: 5.0000 mg | DELAYED_RELEASE_TABLET | Freq: Every day | ORAL | Status: DC | PRN

## 2018-01-01 MED ORDER — ACETAMINOPHEN 650 MG RE SUPP: 650.0000 mg | Freq: Four times a day (QID) | RECTAL | Status: DC | PRN

## 2018-01-01 NOTE — ED Provider Notes (Signed)
Patient placed in Quick Look pathway, seen and evaluated   Chief Complaint: sent to ED by PCP for right buttock abscess despite oral antibiotics  HPI:   Pain, swelling and warmth to right buttock x 1.5 weeks.   ROS: no fevers, chills, pain with BMs, drainage. H/o diabetes on insulin.   Physical Exam:   Gen: No distress  Neuro: Awake and Alert  Skin: Warm    Focused Exam: Approx 12 x 12 cm/grapefruit sized area of induration to right buttock without obvious central fluctuance. No perianal tenderness or fluctuance. Afebrile.    Initiation of care has begun. The patient has been counseled on the process, plan, and necessity for staying for the completion/evaluation, and the remainder of the medical screening examination. Screening labs ordered. CT A/P without contrast ordered by PCP reviewed, shows area of "significantly worsening cellulitis with possible underlying abscesses".     Liberty Handy, PA-C 01/01/18 1649    Terrilee Files, MD 01/02/18 [phone]

## 2018-01-01 NOTE — Progress Notes (Signed)
Pharmacy Antibiotic Note  Luis [name] is a 52 y.o. male admitted on 01/01/2018 with cellulitis.  Pharmacy has been consulted for vancomycin dosing.  Presenting with R buttock cellulites - was treated here recently (last vanc dose was on 2/23), with increasing pain. WBC elevated at 12.8>13.4. LA is 2.24. Scr is elevated at 1.58 (normCrCl 57 mL/min). Afebrile.  Plan: Vancomycin 2500 mg IV once  Vancomycin 1750 mg IV every 24 hours Monitor renal fx, cx results, clinical pic, and VT as needed  Height: 6\' 3"  (190.5 cm) Weight: (!) 346 lb (156.9 kg) IBW/kg (Calculated) : 84.5  Temp (24hrs), Avg:97.8 F (36.6 C), Min:97.7 F (36.5 C), Max:97.8 F (36.6 C)  Recent Labs  Lab 01/01/18 1029 01/01/18 1648 01/01/18 1708  WBC 12.8* 13.4*  --   CREATININE 1.43 1.58*  --   LATICACIDVEN  --   --  2.24*    Estimated Creatinine Clearance: 90.8 mL/min (A) (by C-G formula based on SCr of 1.58 mg/dL (H)).    Allergies  Allergen Reactions  . Demerol [Meperidine] Itching  . Morphine And Related Itching    Antimicrobials this admission: Vancomycin 3/1 >>   Dose adjustments this admission: N/A  Microbiology results: N/A  Thank you for allowing pharmacy to be a part of this patient's care.  Girard Cooter, PharmD Clinical Pharmacist  Pager: [phone] Phone: [phone] 01/01/2018 7:49 PM

## 2018-01-01 NOTE — Progress Notes (Signed)
Luis [name] is a 52 y.o. male is here for follow up.  History of Present Illness:  Luis [name] CMA acting as scribe for Dr. Juleen China.  HPI: Patient comes in today for a hospital follow up.   Patient presented to [facility] on 12/20/2017 after a few weeks of worsening weakness and diabetic complications.  He was found to be in DKA.  CT of his abdomen revealed a small abscess in his right medial buttock.  Patient was hydrated, transitioned to insulin therapy, and given IV antibiotics.  He was discharged on 01/04/2018.  At that time he was discharged on NovoLog 70/30,  and Keflex to finish up the week as his changes in medication.  Patient states that he has been compliant with all of his medication changes.  He is unable to give me an accurate list of his current medications today.  He complains of severe pain in his right buttock and feels very weak today.  His mother is with him.  His mother reports that they pushed to leave the hospital earlier than the hospitalist was hoping.  Blood sugar checked while in the room and was 135.  Review of Systems  Constitutional: Negative for chills, fever and weight loss.  Respiratory: Negative for cough.   Cardiovascular: Negative for chest pain and palpitations.  Gastrointestinal: Negative for constipation, diarrhea, nausea and vomiting.  Neurological: Negative for dizziness.   Health Maintenance Due  Topic Date Due  . PNEUMOCOCCAL POLYSACCHARIDE VACCINE (1) 02/12/1970  . TETANUS/TDAP  02/13/1987  . OPHTHALMOLOGY EXAM  03/17/2017   Depression screen PHQ 2/9 06/11/2017  Decreased Interest 0  Down, Depressed, Hopeless 0  PHQ - 2 Score 0   PMHx, SurgHx, SocialHx, FamHx, Medications, and Allergies were reviewed in the Visit Navigator and updated as appropriate.   Patient Active Problem List   Diagnosis Date Noted  . Fever 12/24/2017  . Cellulitis 12/24/2017  . Pressure injury of skin 12/21/2017  . DKA (diabetic ketoacidoses) ([place]) 12/20/2017  .  Diarrhea 08/20/2017  . Onychomycosis 06/14/2017  . Bilateral edema of lower extremity 06/14/2017  . Hyperlipidemia   . Difficulty walking 06/11/2017  . OSA (obstructive sleep apnea) 03/31/2017  . Debility 03/17/2017  . NICM (nonischemic cardiomyopathy) ([place])   . Morbid obesity ([place])   . Former smoker   . Diabetes mellitus type 2 in obese ([place])   . Diastolic congestive heart failure ([place])   . Essential hypertension 03/19/2016  . Paroxysmal atrial fibrillation ([place]) 03/19/2016   Social History   Tobacco Use  . Smoking status: Former Smoker    Packs/day: 0.50    Years: 20.00    Pack years: 10.00    Types: Cigarettes    Last attempt to quit: 03/02/2009    Years since quitting: 8.8  . Smokeless tobacco: Never Used  Substance Use Topics  . Alcohol use: No  . Drug use: No   Current Medications and Allergies:   Current Outpatient Medications:  .  albuterol (PROVENTIL HFA;VENTOLIN HFA) 108 (90 Base) MCG/ACT inhaler, Inhale 2 puffs into the lungs every 4 (four) hours as needed for wheezing., Disp: 1 Inhaler, Rfl: 0 .  aspirin EC 81 MG EC tablet, Take 1 tablet (81 mg total) by mouth daily., Disp: 30 tablet, Rfl: 0 .  blood glucose meter kit and supplies, Dispense based on patient and insurance preference. Use up to four times daily as directed. (FOR ICD-10 E10.9, E11.9)., Disp: 1 each, Rfl: 0 .  cephALEXin (KEFLEX) 500 MG capsule, Take 1  capsule (500 mg total) by mouth every 6 (six) hours for 7 days., Disp: 28 capsule, Rfl: 0 .  Hydrocortisone (GERHARDT'S BUTT CREAM) CREA, Apply 1 application topically 4 (four) times daily., Disp: 1 each, Rfl: 0 .  insulin aspart protamine- aspart (NOVOLOG MIX 70/30) (70-30) 100 UNIT/ML injection, Inject 0.45 mLs (45 Units total) into the skin 2 (two) times daily with a meal., Disp: 10 mL, Rfl: 11 .  losartan (COZAAR) 25 MG tablet, Take 1 tablet (25 mg total) by mouth daily. (Patient not taking: Reported on 12/28/2017), Disp: 30 tablet, Rfl: 2 .   Metoprolol Tartrate 37.5 MG TABS, Take 37.5 mg by mouth 2 (two) times daily., Disp: 180 tablet, Rfl: 0 .  simvastatin (ZOCOR) 20 MG tablet, Take 1 tablet (20 mg total) by mouth at bedtime. (Patient not taking: Reported on 12/28/2017), Disp: 90 tablet, Rfl: 3 .  torsemide (DEMADEX) 10 MG tablet, Take 1 - 2 tablets daily for swelling, Disp: 180 tablet, Rfl: 1 .  traZODone (DESYREL) 50 MG tablet, Take 0.5-1 tablets (25-50 mg total) by mouth at bedtime as needed for sleep., Disp: 30 tablet, Rfl: 3  Allergies  Allergen Reactions  . Demerol [Meperidine] Itching  . Morphine And Related Itching   Review of Systems   Pertinent items are noted in the HPI. Otherwise, ROS is negative.  Vitals:   Vitals:   01/01/18 0943  BP: 118/80  Pulse: 85  Temp: 97.7 F (36.5 C)  TempSrc: Oral  SpO2: 95%  Weight: (!) 345 lb 3.2 oz (156.6 kg)  Height: '6\' 3"'$  (1.905 m)     Body mass index is 43.15 kg/m.  Physical Exam:   Physical Exam  Constitutional: He is oriented to person, place, and time. He appears well-developed and well-nourished. No distress.  HENT:  Head: Normocephalic and atraumatic.  Right Ear: External ear normal.  Left Ear: External ear normal.  Nose: Nose normal.  Mouth/Throat: Oropharynx is clear and moist.  Eyes: Conjunctivae and EOM are normal. Pupils are equal, round, and reactive to light.  Neck: Normal range of motion. Neck supple.  Cardiovascular: Normal rate, regular rhythm, normal heart sounds and intact distal pulses.  Pulmonary/Chest: Effort normal and breath sounds normal.  Abdominal: Soft. Bowel sounds are normal.  Musculoskeletal: Normal range of motion.  Neurological: He is alert and oriented to person, place, and time.  Skin: Skin is warm and dry.  Right buttock with large area of induration, no skin surface changes, very tender to palpation.   Psychiatric: He has a normal mood and affect. His behavior is normal. Judgment and thought content normal.  Nursing note  and vitals reviewed.   Results for orders placed or performed in visit on 01/01/18  CBC with Differential/Platelet  Result Value Ref Range   WBC 12.8 (H) 4.0 - 10.5 K/uL   RBC 4.51 4.22 - 5.81 Mil/uL   Hemoglobin 14.2 13.0 - 17.0 g/dL   HCT 41.9 39.0 - 52.0 %   MCV 92.9 78.0 - 100.0 fl   MCHC 33.9 30.0 - 36.0 g/dL   RDW 14.3 11.5 - 15.5 %   Platelets 390.0 150.0 - 400.0 K/uL   Neutrophils Relative % 69.2 43.0 - 77.0 %   Lymphocytes Relative 23.7 12.0 - 46.0 %   Monocytes Relative 4.3 3.0 - 12.0 %   Eosinophils Relative 1.8 0.0 - 5.0 %   Basophils Relative 1.0 0.0 - 3.0 %   Neutro Abs 8.8 (H) 1.4 - 7.7 K/uL   Lymphs Abs 3.0  0.7 - 4.0 K/uL   Monocytes Absolute 0.5 0.1 - 1.0 K/uL   Eosinophils Absolute 0.2 0.0 - 0.7 K/uL   Basophils Absolute 0.1 0.0 - 0.1 K/uL  Comprehensive metabolic panel  Result Value Ref Range   Sodium 138 135 - 145 mEq/L   Potassium 3.0 (L) 3.5 - 5.1 mEq/L   Chloride 103 96 - 112 mEq/L   CO2 24 19 - 32 mEq/L   Glucose, Bld 128 (H) 70 - 99 mg/dL   BUN 13 6 - 23 mg/dL   Creatinine, Ser 1.43 0.40 - 1.50 mg/dL   Total Bilirubin 0.5 0.2 - 1.2 mg/dL   Alkaline Phosphatase 103 39 - 117 U/L   AST 23 0 - 37 U/L   ALT 28 0 - 53 U/L   Total Protein 8.6 (H) 6.0 - 8.3 g/dL   Albumin 2.9 (L) 3.5 - 5.2 g/dL   Calcium 8.5 8.4 - 10.5 mg/dL   GFR 67.35 >60.00 mL/min  Magnesium  Result Value Ref Range   Magnesium 2.0 1.5 - 2.5 mg/dL  Phosphorus  Result Value Ref Range   Phosphorus 2.1 (L) 2.3 - 4.6 mg/dL   Assessment and Plan:   1. Essential hypertension Current blood pressure at goal.  Patient is not sure which medications he actually takes.  Losartan is on his medication list.  He does believe that he is taking the medication.  2. Diabetes mellitus type 2 in obese ([place]) Blood sugar at this moment is 135.  He did have some trouble obtaining NovoLog after leaving the hospital.  He does have but now uses it daily.  - CBC with Differential/Platelet -  Comprehensive metabolic panel - Magnesium - Phosphorus - CT ABDOMEN PELVIS WO CONTRAST  3. Diabetic ketoacidosis without coma associated with type 2 diabetes mellitus [facility]) Patient was admitted to [facility] from 12/20/17 to 12/26/17.  He was diagnosed with diabetic ketoacidosis.  He was transitioned to NovoLog while in the hospital as he has not been able to afford his Lantus.  - CBC with Differential/Platelet - Comprehensive metabolic panel - Magnesium - Phosphorus - CT ABDOMEN PELVIS WO CONTRAST  4. Abscess of right buttock Unfortunately, the patient states that he was able to leave the hospital earlier than was originally recommended by his hospitalist.  He was maintained on IV broad-spectrum antibiotics during his time in the hospital.  He was discharged with Keflex.  His right gluteal pain has increased significantly.  On exam, the area is indurated without overlying skin changes.  He was sent for a CT to evaluate any changes to the abscess.  A call report is concerning for significant worsening of the abscess.  Patient was called by my CMA and told to go to the emergency room for reevaluation as he has now failed outpatient antibiotic therapy.  Prior to leaving our office, he was given a prescription for Percocet as below.  - oxyCODONE-acetaminophen (PERCOCET) 10-325 MG tablet; Take 1 tablet by mouth every 8 (eight) hours as needed for pain.  Dispense: 20 tablet; Refill: 0 - CBC with Differential/Platelet - Comprehensive metabolic panel - Magnesium - Phosphorus - CT ABDOMEN PELVIS WO CONTRAST  5. Morbid obesity ([place]) After he stabilizes, we will work on weight loss.  6. Hospital discharge follow-up As above.  We discussed potential need for home health skilled nursing as an option if his CT did not show worsening abscess.  Will follow along during the subsequent admission and see him again after he leaves.  Medication  reconciliation:  '[x]'$   Medication list updated '[x]'$   New  medication list given to patient/family/caregiver  Referrals: '[]'$   None needed '[x]'$   Referrals made to: Endocrine  Community resources identified for patient/family:  '[]'$   None needed  '[x]'$   Home health agency '[]'$   Assisted living  '[]'$   Hospice  '[]'$   Support group  '[x]'$   Education program  Durable medical equipment ordered:  '[x]'$   None needed  '[]'$   DME ordered:   Additional communication delivered or planned:  '[x]'$   Family/Caregiver:  '[]'$   Specialists:  '[]'$   Other:  Patient education: Topics discussed: AS ABOVE  Initial transitional care contact was made on 12/28/17 (see separate note).  7. Hypokalemia He will need replacement.   . Reviewed expectations re: course of current medical issues. . Discussed self-management of symptoms. . Outlined signs and symptoms indicating need for more acute intervention. . Patient verbalized understanding and all questions were answered. Marland Kitchen Health Maintenance issues including appropriate healthy diet, exercise, and smoking avoidance were discussed with patient. . See orders for this visit as documented in the electronic medical record. . Patient received an After Visit Summary.  CMA served as Education administrator during this visit. History, Physical, and Plan performed by medical provider. The above documentation has been reviewed and is accurate and complete. Briscoe Deutscher, D.O.  Briscoe Deutscher, DO [place], [facility] 01/01/2018

## 2018-01-01 NOTE — ED Triage Notes (Addendum)
Per [facility], pt from [place] dr's office with abscess to right buttocks x 1 week, pt had CT completed and then was sent here. Pt has no other complaints, Ambulatory. VSS.

## 2018-01-01 NOTE — Telephone Encounter (Signed)
Per Dr. Earlene Plater spoke with the Luis [name] and due to CT scan being worse Luis [name] needs to go to the ED. I offered to call EMS to pick the Luis [name] up from [place] [facility]. Luis [name] stated that he would wait for his mother to pick him up. Stacey from Ct called back and stated that his motor would not pick him up so she called EMS to get him. I have called [facility] to inform the Charge nurse to inform why he was coming.

## 2018-01-01 NOTE — ED Notes (Signed)
IV team at bedside 

## 2018-01-01 NOTE — ED Notes (Signed)
Informed lactic acid result 2.24 to first nurse Dahlia Client

## 2018-01-01 NOTE — ED Provider Notes (Signed)
[facility] EMERGENCY DEPARTMENT Provider Note   CSN: [id number] Arrival date & time: 01/01/18  1606     History   Chief Complaint Chief Complaint  Patient presents with  . Abscess    HPI Luis [name] is a 52 y.o. male.  Diabetic who was recently admitted here for fever and elevated blood sugar and found to have a right buttock cellulitis.  He was on IV antibiotics and transition to oral sent home and has been home about a week.  He feels like his fever and blood sugars have improved and went today for a follow-up exam by his primary care doctor.  He cannot see the area that was affected but he states the pain is gotten worse in that area but it feels actually improved.  He rates the pain is 5 out of 10 in its when he sitting on pain is minimal when he is not putting pressure on the area.  His PCP evaluated him and ordered him a CAT scan and then when the results of the CAT scan came back he was instructed to present to the emergency department.  He states he has been eating and drinking fine and not feeling nauseous and his blood sugars have been under good control.  He has not had any further fevers.  The history is provided by the patient.  Abscess  Location:  Pelvis Pelvic abscess location:  R buttock Abscess quality: induration and painful   Abscess quality: not draining, no redness and not weeping   Duration:  4 days Progression:  Unchanged Pain details:    Quality:  Throbbing   Severity:  Moderate   Duration:  4 days   Timing:  Intermittent Context: diabetes   Relieved by:  Oral antibiotics Associated symptoms: no anorexia, no fatigue, no fever, no nausea and no vomiting   Risk factors: prior abscess     Past Medical History:  Diagnosis Date  . Abnormal nuclear stress test 03/24/2016  . Atrial fibrillation ([employer])   . CHF (congestive heart failure) ([place])   . DM (diabetes mellitus) ([place])   . Essential hypertension   . History of tobacco abuse   .  HTN (hypertension)   . Hyperlipidemia   . Morbid obesity ([place])   . OSA (obstructive sleep apnea)   . Pneumonia 2017    Patient Active Problem List   Diagnosis Date Noted  . Fever 12/24/2017  . Cellulitis 12/24/2017  . Pressure injury of skin 12/21/2017  . DKA (diabetic ketoacidoses) ([place]) 12/20/2017  . Diarrhea 08/20/2017  . Onychomycosis 06/14/2017  . Bilateral edema of lower extremity 06/14/2017  . Hyperlipidemia   . Difficulty walking 06/11/2017  . OSA (obstructive sleep apnea) 03/31/2017  . Debility 03/17/2017  . NICM (nonischemic cardiomyopathy) ([place])   . Morbid obesity ([place])   . Former smoker   . Diabetes mellitus type 2 in obese ([place])   . Diastolic congestive heart failure ([place])   . Essential hypertension 03/19/2016  . Paroxysmal atrial fibrillation ([place]) 03/19/2016    Past Surgical History:  Procedure Laterality Date  . CARDIAC CATHETERIZATION N/A 03/24/2016   Procedure: Left Heart Cath and Coronary Angiography;  Surgeon: Leonie Man, MD;  Location: [facility];  Service: Cardiovascular;  Laterality: N/A;  . FEMUR IM NAIL Right 2012  . FRACTURE SURGERY         Home Medications    Prior to Admission medications   Medication Sig Start Date End Date Taking? Authorizing Provider  albuterol (PROVENTIL HFA;VENTOLIN HFA) 108 (90 Base) MCG/ACT inhaler Inhale 2 puffs into the lungs every 4 (four) hours as needed for wheezing. 03/23/17   Angiulli, Lavon Paganini, PA-C  aspirin EC 81 MG EC tablet Take 1 tablet (81 mg total) by mouth daily. 03/26/16   Theodis Blaze, MD  blood glucose meter kit and supplies Dispense based on patient and insurance preference. Use up to four times daily as directed. (FOR ICD-10 E10.9, E11.9). 12/26/17   Arrien, Jimmy Picket, MD  cephALEXin (KEFLEX) 500 MG capsule Take 1 capsule (500 mg total) by mouth every 6 (six) hours for 7 days. 12/26/17 01/02/18  Arrien, Jimmy Picket, MD  Hydrocortisone (GERHARDT'S BUTT CREAM) CREA Apply 1  application topically 4 (four) times daily. 12/26/17   Arrien, Jimmy Picket, MD  insulin aspart protamine- aspart (NOVOLOG MIX 70/30) (70-30) 100 UNIT/ML injection Inject 0.45 mLs (45 Units total) into the skin 2 (two) times daily with a meal. 12/26/17   Arrien, Jimmy Picket, MD  losartan (COZAAR) 25 MG tablet Take 1 tablet (25 mg total) by mouth daily. Patient not taking: Reported on 12/28/2017 11/19/17   Briscoe Deutscher, DO  Metoprolol Tartrate 37.5 MG TABS Take 37.5 mg by mouth 2 (two) times daily. 07/22/17   Briscoe Deutscher, DO  oxyCODONE-acetaminophen (PERCOCET) 10-325 MG tablet Take 1 tablet by mouth every 8 (eight) hours as needed for pain. 01/01/18   Briscoe Deutscher, DO  Phenyleph-CPM-DM-APAP (ALKA-SELTZER PLUS COLD & COUGH) 03-04-09-325 MG CAPS Take 1 tablet by mouth daily.    [provider]  simvastatin (ZOCOR) 20 MG tablet Take 1 tablet (20 mg total) by mouth at bedtime. Patient not taking: Reported on 12/28/2017 11/19/17   Briscoe Deutscher, DO  Syringe, Disposable, (10-12CC SYRINGE) 12 ML MISC 1 Units by Does not apply route 4 (four) times daily. Insulin syringes with needles. 12/26/17 01/25/18  Arrien, Jimmy Picket, MD  torsemide (DEMADEX) 10 MG tablet Take 1 - 2 tablets daily for swelling 10/08/17   Briscoe Deutscher, DO  traZODone (DESYREL) 50 MG tablet Take 0.5-1 tablets (25-50 mg total) by mouth at bedtime as needed for sleep. 11/19/17   Briscoe Deutscher, DO    Family History Family History  Problem Relation Age of Onset  . Diabetes Mellitus II Mother   . Diabetes type II Other   . Hypertension Maternal Grandfather   . Stroke Neg Hx   . Cancer Neg Hx     Social History Social History   Tobacco Use  . Smoking status: Former Smoker    Packs/day: 0.50    Years: 20.00    Pack years: 10.00    Types: Cigarettes    Last attempt to quit: 03/02/2009    Years since quitting: 8.8  . Smokeless tobacco: Never Used  Substance Use Topics  . Alcohol use: No  . Drug use: No      Allergies   Demerol [meperidine] and Morphine and related   Review of Systems Review of Systems  Constitutional: Negative for chills, fatigue and fever.  HENT: Negative for ear pain and sore throat.   Eyes: Negative for pain and visual disturbance.  Respiratory: Negative for cough and shortness of breath.   Cardiovascular: Negative for chest pain and palpitations.  Gastrointestinal: Negative for abdominal pain, anorexia, nausea and vomiting.  Genitourinary: Negative for dysuria and hematuria.  Musculoskeletal: Negative for arthralgias and back pain.  Skin: Negative for color change and rash.  Neurological: Negative for seizures and syncope.  All other systems reviewed and  are negative.    Physical Exam Updated Vital Signs BP 119/78 (BP Location: Left Arm)   Pulse (!) 107   Temp 97.8 F (36.6 C) (Oral)   Resp 18   Ht '6\' 3"'$  (1.905 m)   Wt (!) 156.9 kg (346 lb)   SpO2 97%   BMI 43.25 kg/m   Physical Exam  Constitutional: He appears well-developed and well-nourished.  HENT:  Head: Normocephalic and atraumatic.  Eyes: Conjunctivae are normal.  Neck: Neck supple.  Cardiovascular: Normal rate and regular rhythm.  No murmur heard. Pulmonary/Chest: Effort normal and breath sounds normal. No respiratory distress.  Abdominal: Soft. There is no tenderness.  Musculoskeletal: He exhibits no edema or tenderness.  Neurological: He is alert.  Skin: Skin is warm and dry.     Psychiatric: He has a normal mood and affect.  Nursing note and vitals reviewed.    ED Treatments / Results  Labs (all labs ordered are listed, but only abnormal results are displayed) Labs Reviewed  CBC WITH DIFFERENTIAL/PLATELET - Abnormal; Notable for the following components:      Result Value   WBC 13.4 (*)    Neutro Abs 8.9 (*)    All other components within normal limits  BASIC METABOLIC PANEL - Abnormal; Notable for the following components:   Potassium 3.1 (*)    CO2 21 (*)     Glucose, Bld 110 (*)    Creatinine, Ser 1.58 (*)    Calcium 8.1 (*)    GFR calc non Af Amer 50 (*)    GFR calc Af Amer 58 (*)    All other components within normal limits  I-STAT CG4 LACTIC ACID, ED - Abnormal; Notable for the following components:   Lactic Acid, Venous 2.24 (*)    All other components within normal limits  I-STAT CG4 LACTIC ACID, ED    EKG  EKG Interpretation None       Radiology Ct Abdomen Pelvis Wo Contrast  Result Date: 01/01/2018 CLINICAL DATA:  Right buttocks inflammation. EXAM: CT ABDOMEN AND PELVIS WITHOUT CONTRAST TECHNIQUE: Multidetector CT imaging of the abdomen and pelvis was performed following the standard protocol without IV contrast. IV access could not be obtained. COMPARISON:  CT scan of December 23, 2017. FINDINGS: Lower chest: No acute abnormality. Hepatobiliary: No gallstones are noted. Fatty infiltration of the liver is noted. No focal lesion is noted. Pancreas: Unremarkable. No pancreatic ductal dilatation or surrounding inflammatory changes. Spleen: Normal in size without focal abnormality. Adrenals/Urinary Tract: Adrenal glands are unremarkable. Kidneys are normal, without renal calculi, focal lesion, or hydronephrosis. Bladder is unremarkable. Stomach/Bowel: Stomach is within normal limits. Appendix appears normal. No evidence of bowel wall thickening, distention, or inflammatory changes. Vascular/Lymphatic: No significant vascular findings are present. No enlarged abdominal or pelvic lymph nodes. Reproductive: Prostate is unremarkable. Other: No hernia is noted. There are multiple low densities are seen involving the medial and posterior portion of the right buttocks concerning for worsening cellulitis. Abscess or abscesses may be present, but cannot be diagnosed with certainty due the lack of intravenous contrast. Musculoskeletal: No acute or significant osseous findings. IMPRESSION: Large area of abnormal low density is seen involving the medial and  posterior portion of the right buttocks consistent with significantly worsening cellulitis with possible underlying abscess or abscesses. The presence or absence of abscess cannot be certain due to the lack of intravenous contrast. Electronically Signed   By: Marijo Conception, M.D.   On: 01/01/2018 15:14    Procedures Procedures (  including critical care time)  Medications Ordered in ED Medications - No data to display   Initial Impression / Assessment and Plan / ED Course  I have reviewed the triage vital signs and the nursing notes.  Pertinent labs & imaging results that were available during my care of the patient were reviewed by me and considered in my medical decision making (see chart for details).  Clinical Course as of Jan 02 1916  Fri Jan 01, 2018  1916 Discussed with hospitalist on-call who states he will admit the patient and he will take care of any antibiotics that need to be ordered.  [MB]    Clinical Course User Index [MB] Hayden Rasmussen, MD      Final Clinical Impressions(s) / ED Diagnoses   Final diagnoses:  Cellulitis of right buttock    ED Discharge Orders    None       Hayden Rasmussen, MD 01/02/18 1106

## 2018-01-01 NOTE — ED Notes (Signed)
Attempted IV access with blood draw x2, without success. Ordering IV Team.

## 2018-01-01 NOTE — H&P (Signed)
History and Physical    Luis [name] AQT:[id number] DOB: [birth year] DOA: 01/01/2018  PCP: Briscoe Deutscher, DO   Patient coming from: Home  Chief Complaint: Right buttock pain   HPI: Luis [name] is a 52 y.o. male with medical history significant for insulin-dependent diabetes mellitus, hypertension, and chronic diastolic CHF, now presenting to the emergency department for worsening pain at the right buttock.  Patient had been discharged from the hospital on 12/27/2017 after a one-week admission involving DKA secondary to sepsis from right gluteal cellulitis.  He was treated with broad-spectrum IV antibiotics in the hospital, surgical consultation was obtained, and the patient was eventually discharged on Keflex.  He saw his PCP today with worsening pain at the right buttock and outpatient CT of the abdomen and pelvis without contrast was performed, revealing significantly worsening cellulitis with possible underlying abscess.  He was directed to the ED for further evaluation and management of this.  He reports a general malaise, but no fevers per se.  Denies drainage from the site.  No other rash or wound.  No shortness of breath, cough, or leg swelling.  Reports strict adherence with his Keflex.  ED Course: Upon arrival to the ED, patient is found to be afebrile, saturating well on room air, slightly tachycardic, and with vitals otherwise stable.  Chemistry panel is notable for potassium of 3.1, bicarbonate 21, and creatinine 1.58, similar to recent priors, but up from 1.0 in January 2019.  CBC is notable for leukocytosis to 13,400 and lactic acid is elevated to 2.24.  Patient was given a liter of normal saline in the ED.  Tachycardia resolved.  He remains hemodynamically stable, respiratory distress, and will be admitted to the medical-surgical unit for ongoing evaluation and management of worsening cellulitis involving the right buttock with possible underlying abscess despite outpatient  antibiotics.  Review of Systems:  All other systems reviewed and apart from HPI, are negative.  Past Medical History:  Diagnosis Date  . Abnormal nuclear stress test 03/24/2016  . Atrial fibrillation ([place])   . CHF (congestive heart failure) ([place])   . DM (diabetes mellitus) ([place])   . Essential hypertension   . History of tobacco abuse   . HTN (hypertension)   . Hyperlipidemia   . Morbid obesity ([place])   . OSA (obstructive sleep apnea)   . Pneumonia 2017    Past Surgical History:  Procedure Laterality Date  . CARDIAC CATHETERIZATION N/A 03/24/2016   Procedure: Left Heart Cath and Coronary Angiography;  Surgeon: Leonie Man, MD;  Location: [facility];  Service: Cardiovascular;  Laterality: N/A;  . FEMUR IM NAIL Right 2012  . FRACTURE SURGERY       reports that he quit smoking about 8 years ago. His smoking use included cigarettes. He has a 10.00 pack-year smoking history. he has never used smokeless tobacco. He reports that he does not drink alcohol or use drugs.  Allergies  Allergen Reactions  . Demerol [Meperidine] Itching  . Morphine And Related Itching    Family History  Problem Relation Age of Onset  . Diabetes Mellitus II Mother   . Diabetes type II Other   . Hypertension Maternal Grandfather   . Stroke Neg Hx   . Cancer Neg Hx      Prior to Admission medications   Medication Sig Start Date End Date Taking? Authorizing Provider  albuterol (PROVENTIL HFA;VENTOLIN HFA) 108 (90 Base) MCG/ACT inhaler Inhale 2 puffs into the lungs every 4 (four) hours as needed  for wheezing. 03/23/17   Angiulli, Lavon Paganini, PA-C  aspirin EC 81 MG EC tablet Take 1 tablet (81 mg total) by mouth daily. 03/26/16   Theodis Blaze, MD  blood glucose meter kit and supplies Dispense based on patient and insurance preference. Use up to four times daily as directed. (FOR ICD-10 E10.9, E11.9). 12/26/17   Arrien, Jimmy Picket, MD  cephALEXin (KEFLEX) 500 MG capsule Take 1 capsule (500 mg  total) by mouth every 6 (six) hours for 7 days. 12/26/17 01/02/18  Arrien, Jimmy Picket, MD  Hydrocortisone (GERHARDT'S BUTT CREAM) CREA Apply 1 application topically 4 (four) times daily. 12/26/17   Arrien, Jimmy Picket, MD  insulin aspart protamine- aspart (NOVOLOG MIX 70/30) (70-30) 100 UNIT/ML injection Inject 0.45 mLs (45 Units total) into the skin 2 (two) times daily with a meal. 12/26/17   Arrien, Jimmy Picket, MD  losartan (COZAAR) 25 MG tablet Take 1 tablet (25 mg total) by mouth daily. Patient not taking: Reported on 12/28/2017 11/19/17   Briscoe Deutscher, DO  Metoprolol Tartrate 37.5 MG TABS Take 37.5 mg by mouth 2 (two) times daily. 07/22/17   Briscoe Deutscher, DO  oxyCODONE-acetaminophen (PERCOCET) 10-325 MG tablet Take 1 tablet by mouth every 8 (eight) hours as needed for pain. 01/01/18   Briscoe Deutscher, DO  Phenyleph-CPM-DM-APAP (ALKA-SELTZER PLUS COLD & COUGH) 03-04-09-325 MG CAPS Take 1 tablet by mouth daily.    [provider]  simvastatin (ZOCOR) 20 MG tablet Take 1 tablet (20 mg total) by mouth at bedtime. Patient not taking: Reported on 12/28/2017 11/19/17   Briscoe Deutscher, DO  Syringe, Disposable, (10-12CC SYRINGE) 12 ML MISC 1 Units by Does not apply route 4 (four) times daily. Insulin syringes with needles. 12/26/17 01/25/18  Arrien, Jimmy Picket, MD  torsemide (DEMADEX) 10 MG tablet Take 1 - 2 tablets daily for swelling 10/08/17   Briscoe Deutscher, DO  traZODone (DESYREL) 50 MG tablet Take 0.5-1 tablets (25-50 mg total) by mouth at bedtime as needed for sleep. 11/19/17   Briscoe Deutscher, DO    Physical Exam: Vitals:   01/01/18 1604 01/01/18 1611 01/01/18 1842  BP:  119/78 113/83  Pulse:  (!) 107 100  Resp:  18 18  Temp:  97.8 F (36.6 C)   TempSrc:  Oral   SpO2:  97% 96%  Weight: (!) 156.9 kg (346 lb)    Height: '6\' 3"'$  (1.905 m)        Constitutional: NAD, calm, obese  Eyes: PERTLA, lids and conjunctivae normal ENMT: Mucous membranes are moist. Posterior pharynx  clear of any exudate or lesions.   Neck: normal, supple, no masses, no thyromegaly Respiratory: clear to auscultation bilaterally, no wheezing, no crackles. Normal respiratory effort.   Cardiovascular: S1 & S2 heard, regular rate and rhythm. No extremity edema. No significant JVD. Abdomen: No distension, no tenderness, no masses palpated. Bowel sounds normal.  Musculoskeletal: no clubbing / cyanosis. No joint deformity upper and lower extremities.   Skin: Erythema, induration, warmth, and tenderness involving right buttock without drainage. Skin is otherwise warm, dry, well-perfused. Neurologic: CN 2-12 grossly intact. Sensation intact. Strength 5/5 in all 4 limbs.  Psychiatric:  Alert and oriented x 3. Calm, cooperative.     Labs on Admission: I have personally reviewed following labs and imaging studies  CBC: Recent Labs  Lab 01/01/18 1029 01/01/18 1648  WBC 12.8* 13.4*  NEUTROABS 8.8* 8.9*  HGB 14.2 14.7  HCT 41.9 44.5  MCV 92.9 95.1  PLT 390.0 373  Basic Metabolic Panel: Recent Labs  Lab 01/01/18 1029 01/01/18 1648  NA 138 137  K 3.0* 3.1*  CL 103 102  CO2 24 21*  GLUCOSE 128* 110*  BUN 13 11  CREATININE 1.43 1.58*  CALCIUM 8.5 8.1*  MG 2.0  --   PHOS 2.1*  --    GFR: Estimated Creatinine Clearance: 90.8 mL/min (A) (by C-G formula based on SCr of 1.58 mg/dL (H)). Liver Function Tests: Recent Labs  Lab 01/01/18 1029  AST 23  ALT 28  ALKPHOS 103  BILITOT 0.5  PROT 8.6*  ALBUMIN 2.9*   No results for input(s): LIPASE, AMYLASE in the last 168 hours. No results for input(s): AMMONIA in the last 168 hours. Coagulation Profile: No results for input(s): INR, PROTIME in the last 168 hours. Cardiac Enzymes: No results for input(s): CKTOTAL, CKMB, CKMBINDEX, TROPONINI in the last 168 hours. BNP (last 3 results) Recent Labs    06/11/17 0916 11/19/17 0941  PROBNP 167.0* 75.0   HbA1C: No results for input(s): HGBA1C in the last 72 hours. CBG: Recent Labs   Lab 12/25/17 2147 12/26/17 0755  GLUCAP 156* 176*   Lipid Profile: No results for input(s): CHOL, HDL, LDLCALC, TRIG, CHOLHDL, LDLDIRECT in the last 72 hours. Thyroid Function Tests: No results for input(s): TSH, T4TOTAL, FREET4, T3FREE, THYROIDAB in the last 72 hours. Anemia Panel: No results for input(s): VITAMINB12, FOLATE, FERRITIN, TIBC, IRON, RETICCTPCT in the last 72 hours. Urine analysis:    Component Value Date/Time   COLORURINE STRAW (A) 12/20/2017 1150   APPEARANCEUR CLEAR 12/20/2017 1150   LABSPEC 1.028 12/20/2017 1150   PHURINE 5.0 12/20/2017 1150   GLUCOSEU >=500 (A) 12/20/2017 1150   HGBUR SMALL (A) 12/20/2017 1150   BILIRUBINUR NEGATIVE 12/20/2017 1150   KETONESUR 80 (A) 12/20/2017 1150   PROTEINUR NEGATIVE 12/20/2017 1150   NITRITE NEGATIVE 12/20/2017 [place] 12/20/2017 1150   Sepsis Labs: '@LABRCNTIP'$ (procalcitonin:4,lacticidven:4) )No results found for this or any previous visit (from the past 240 hour(s)).   Radiological Exams on Admission: Ct Abdomen Pelvis Wo Contrast  Result Date: 01/01/2018 CLINICAL DATA:  Right buttocks inflammation. EXAM: CT ABDOMEN AND PELVIS WITHOUT CONTRAST TECHNIQUE: Multidetector CT imaging of the abdomen and pelvis was performed following the standard protocol without IV contrast. IV access could not be obtained. COMPARISON:  CT scan of December 23, 2017. FINDINGS: Lower chest: No acute abnormality. Hepatobiliary: No gallstones are noted. Fatty infiltration of the liver is noted. No focal lesion is noted. Pancreas: Unremarkable. No pancreatic ductal dilatation or surrounding inflammatory changes. Spleen: Normal in size without focal abnormality. Adrenals/Urinary Tract: Adrenal glands are unremarkable. Kidneys are normal, without renal calculi, focal lesion, or hydronephrosis. Bladder is unremarkable. Stomach/Bowel: Stomach is within normal limits. Appendix appears normal. No evidence of bowel wall thickening,  distention, or inflammatory changes. Vascular/Lymphatic: No significant vascular findings are present. No enlarged abdominal or pelvic lymph nodes. Reproductive: Prostate is unremarkable. Other: No hernia is noted. There are multiple low densities are seen involving the medial and posterior portion of the right buttocks concerning for worsening cellulitis. Abscess or abscesses may be present, but cannot be diagnosed with certainty due the lack of intravenous contrast. Musculoskeletal: No acute or significant osseous findings. IMPRESSION: Large area of abnormal low density is seen involving the medial and posterior portion of the right buttocks consistent with significantly worsening cellulitis with possible underlying abscess or abscesses. The presence or absence of abscess cannot be certain due to the lack of intravenous contrast.  Electronically Signed   By: Marijo Conception, M.D.   On: 01/01/2018 15:14    EKG: Not performed.   Assessment/Plan  1. Cellulitis  - Presents with worsening pain in right buttock where he has worsening cellulitis despite continued adherence with outpatient Keflex  - He has increased leukocytosis, elevated lactate, no fever here  - CT without contrast is consistent with worsening cellulitis and possible abscess  - Plan to start IV vancomycin, hydrate with IVF overnight, and if fails to improve as expected, consider repeat imaging with contrast to look for underlying abscess if renal function allows   2. Renal insufficiency  - SCr is 1.58 on admission, similar to most recent priors, but had been 1.0 as recently as January 2019  - Appears hypovolemic on admission and was given 1 liter NS in ED  - Hold ARB and torsemide, hydrate gently overnight, repeat chemistries in am    3. Insulin-dependent DM  - A1c was 11.0% last month  - Managed at home with Novolog 70/30 45 units BID  - Check CBG's, start Levemir 25 units BID and Novolog per sliding-scale while in hospital    4.  Chronic diastolic CHF  - Appears well-compensated  - Echo from May 2018 with EF 44-31%, grade 1 diastolic dysfunction  - Managed at home with torsemide prn swelling only  - Diuretic and ARB held in light of elevated creatinine, gently hydrating overnight  - Follow daily wts, continue metoprolol    5. Hypokalemia  - Serum potasium is 3.1 on admission  - Treated with 20 mEq oral potassium and KCl added to IVF  - Repeat chem panel in am     DVT prophylaxis: Lovenox Code Status: Full  Family Communication: Discussed with patient Disposition Plan: Admit to med-surg Consults called: None Admission status: Inpatient    Vianne Bulls, MD [facility] Pager [phone]  If 7PM-7AM, please contact night-coverage [email or web address] Password [facility]  01/01/2018, 7:37 PM

## 2018-01-02 DIAGNOSIS — Z91199 Patient's noncompliance with other medical treatment and regimen due to unspecified reason: Secondary | ICD-10-CM | POA: Insufficient documentation

## 2018-01-02 DIAGNOSIS — Z9119 Patient's noncompliance with other medical treatment and regimen: Secondary | ICD-10-CM | POA: Insufficient documentation

## 2018-01-02 DIAGNOSIS — L0231 Cutaneous abscess of buttock: Principal | ICD-10-CM

## 2018-01-02 LAB — CBC WITH DIFFERENTIAL/PLATELET
BASOS ABS: 0 10*3/uL (ref 0.0–0.1)
BASOS PCT: 0 %
EOS ABS: 0.1 10*3/uL (ref 0.0–0.7)
EOS PCT: 1 %
HCT: 42 % (ref 39.0–52.0)
Hemoglobin: 13.5 g/dL (ref 13.0–17.0)
Lymphocytes Relative: 35 %
Lymphs Abs: 3.8 10*3/uL (ref 0.7–4.0)
MCH: 30.8 pg (ref 26.0–34.0)
MCHC: 32.1 g/dL (ref 30.0–36.0)
MCV: 95.7 fL (ref 78.0–100.0)
MONOS PCT: 3 %
Monocytes Absolute: 0.3 10*3/uL (ref 0.1–1.0)
Neutro Abs: 6.6 10*3/uL (ref 1.7–7.7)
Neutrophils Relative %: 61 %
PLATELETS: 349 10*3/uL (ref 150–400)
RBC: 4.39 MIL/uL (ref 4.22–5.81)
RDW: 14.7 % (ref 11.5–15.5)
WBC: 10.9 10*3/uL — ABNORMAL HIGH (ref 4.0–10.5)

## 2018-01-02 LAB — BASIC METABOLIC PANEL
ANION GAP: 13 (ref 5–15)
BUN: 13 mg/dL (ref 6–20)
CALCIUM: 7.8 mg/dL — AB (ref 8.9–10.3)
CO2: 22 mmol/L (ref 22–32)
CREATININE: 1.76 mg/dL — AB (ref 0.61–1.24)
Chloride: 102 mmol/L (ref 101–111)
GFR calc Af Amer: 51 mL/min — ABNORMAL LOW (ref >60)
GFR, EST NON AFRICAN AMERICAN: 44 mL/min — AB (ref >60)
GLUCOSE: 147 mg/dL — AB (ref 65–99)
Potassium: 3.1 mmol/L — ABNORMAL LOW (ref 3.5–5.1)
Sodium: 137 mmol/L (ref 135–145)

## 2018-01-02 LAB — GLUCOSE, CAPILLARY
GLUCOSE-CAPILLARY: 109 mg/dL — AB (ref 65–99)
GLUCOSE-CAPILLARY: 156 mg/dL — AB (ref 65–99)
GLUCOSE-CAPILLARY: 181 mg/dL — AB (ref 65–99)
Glucose-Capillary: 137 mg/dL — ABNORMAL HIGH (ref 65–99)

## 2018-01-02 MED ORDER — ADULT MULTIVITAMIN W/MINERALS CH
1.0000 | ORAL_TABLET | Freq: Every day | ORAL | Status: DC
  Administered 2018-01-02 – 2018-01-04 (×3): 1 via ORAL
  Filled 2018-01-02 (×3): qty 1

## 2018-01-02 MED ORDER — BUPIVACAINE-EPINEPHRINE 0.5% -1:200000 IJ SOLN: 20.0000 mL | Freq: Once | INTRAMUSCULAR | Status: DC

## 2018-01-02 MED ORDER — PRO-STAT SUGAR FREE PO LIQD
30.0000 mL | Freq: Two times a day (BID) | ORAL | Status: DC
  Administered 2018-01-02 – 2018-01-04 (×4): 30 mL via ORAL
  Filled 2018-01-02 (×4): qty 30

## 2018-01-02 MED ORDER — JUVEN PO PACK
1.0000 | PACK | Freq: Two times a day (BID) | ORAL | Status: DC
  Administered 2018-01-02 – 2018-01-04 (×4): 1 via ORAL
  Filled 2018-01-02 (×6): qty 1

## 2018-01-02 MED ORDER — POTASSIUM CHLORIDE IN NACL 20-0.9 MEQ/L-% IV SOLN
INTRAVENOUS | Status: DC
  Administered 2018-01-02 – 2018-01-03 (×3): via INTRAVENOUS
  Filled 2018-01-02 (×3): qty 1000

## 2018-01-02 MED ORDER — POTASSIUM CHLORIDE CRYS ER 20 MEQ PO TBCR
40.0000 meq | EXTENDED_RELEASE_TABLET | Freq: Every day | ORAL | Status: DC
  Administered 2018-01-02 – 2018-01-04 (×3): 40 meq via ORAL
  Filled 2018-01-02 (×3): qty 2

## 2018-01-02 MED ORDER — BUPIVACAINE-EPINEPHRINE 0.25% -1:200000 IJ SOLN
20.0000 mL | Freq: Once | INTRAMUSCULAR | Status: DC
  Filled 2018-01-02: qty 20

## 2018-01-02 MED ORDER — BUPIVACAINE-EPINEPHRINE 0.5% -1:200000 IJ SOLN
20.0000 mL | Freq: Once | INTRAMUSCULAR | Status: AC
  Administered 2018-01-02: 100 mg
  Filled 2018-01-02: qty 20

## 2018-01-02 MED ORDER — ENOXAPARIN SODIUM 80 MG/0.8ML ~~LOC~~ SOLN
80.0000 mg | SUBCUTANEOUS | Status: DC
  Administered 2018-01-02 – 2018-01-03 (×2): 80 mg via SUBCUTANEOUS
  Filled 2018-01-02 (×2): qty 0.8

## 2018-01-02 NOTE — Progress Notes (Signed)
Hospitalist progress note   Luis [name]  ZOX:[id number] DOB: 02-21-1966 DOA: 01/01/2018 PCP: Helane Rima, DO   Specialists:   Brief Narrative:  52 year old male Diabetes mellitus type 2 Obesity HTN Prior right femoral shaft fracture 2005 Atrial fibrillation chads score >2 Prior tobacco abuse OSA currently getting CPAP titration Chronic atrial fibrillation nonischemic cardiomyopathy EF 35-40%-normal left heart cath 03/2016  Admission 4/29-5/15 acute respiratory failure secondary to influenza pneumonia Recent admission 2/17-2/23 fatigue dehydration and found to have nonpurulent gluteus cellulitis in addition to diabetic ketoacidosis transitioned to 7030 insulin for better compliance returns to emergency room with significant worsening cellulitis  Potassium 3.1 bicarb 21 creatinine 1.5, baseline 1.2 White count 14 lactic acid 2.2   Assessment & Plan:   Assessment:  The encounter diagnosis was Cellulitis of right buttock.  Cellulitis-CT without contrast concerning for possible abscess.  Was given Keflex on discharge 500 every 6 for 7 days which has been held continue IV vancomycin-area showing pointing and fluid drainage-d/w Dr/ Fredricka Bonine who will see for operative management Chronic kidney disease stage II-III-BUN/creatinine around baseline.  Hydrate with IV saline and 20 of K 100 cc/h Hypokalemia on admission potassium was 3.0 adding K Dur 40 mEq daily Insulin-dependent diabetes mellitus recent DKA-A1c 11.0-home dose 70 3045 units twice daily, currently on Levemir 25 twice daily and sliding scale-adjust as needed Nonischemic cardiomyopathy now resolved with EF 50-55%-careful with fluid.  Previous on torsemide 10-20 mg at bedtime.  Monitor fluid status, continue losartan on discharge 25 if creatinine better, metoprolol 37.5 reordered Depression/sleep continue trazodone 25-50 at bedtime as needed  DVT prophylaxis: lovenox  Code Status:   full   Family Communication:   inpatient   Disposition Plan: await resolution of abcess   Consultants:   surgery  Procedures:   I & D buttcks bedside  Antimicrobials:    Vanc  Subjective:  Awake alert in some discmfort-stting in bloody discharge was unaware area draining  Objective: Vitals:   01/01/18 1845 01/01/18 1930 01/01/18 2201 01/02/18 0506  BP: 118/83 112/85 120/65 107/75  Pulse: 99 86 100 80  Resp:  20 18 18   Temp:   97.9 F (36.6 C) 98.4 F (36.9 C)  TempSrc:   Oral   SpO2: 96% 96% 97% 97%  Weight:    (!) 155 kg (341 lb 11.4 oz)  Height:        Intake/Output Summary (Last 24 hours) at 01/02/2018 0802 Last data filed at 01/02/2018 0500 Gross per 24 hour  Intake 120 ml  Output -  Net 120 ml   Filed Weights   01/01/18 1604 01/02/18 0506  Weight: (!) 156.9 kg (346 lb) (!) 155 kg (341 lb 11.4 oz)    Examination:  Obese pleasant in nad s1 s 2no m CTA B Large indurtaed area of buttocks in Outer Upper L buttck quadrant oriented  Data Reviewed: I have personally reviewed following labs and imaging studies  CBC: Recent Labs  Lab 01/01/18 1029 01/01/18 1648 01/02/18 0324  WBC 12.8* 13.4* 10.9*  NEUTROABS 8.8* 8.9* 6.6  HGB 14.2 14.7 13.5  HCT 41.9 44.5 42.0  MCV 92.9 95.1 95.7  PLT 390.0 373 349   Basic Metabolic Panel: Recent Labs  Lab 01/01/18 1029 01/01/18 1648 01/02/18 0324  NA 138 137 137  K 3.0* 3.1* 3.1*  CL 103 102 102  CO2 24 21* 22  GLUCOSE 128* 110* 147*  BUN 13 11 13   CREATININE 1.43 1.58* 1.76*  CALCIUM 8.5 8.1* 7.8*  MG 2.0  --   --  PHOS 2.1*  --   --    GFR: Estimated Creatinine Clearance: 80.9 mL/min (A) (by C-G formula based on SCr of 1.76 mg/dL (H)). Liver Function Tests: Recent Labs  Lab 01/01/18 1029  AST 23  ALT 28  ALKPHOS 103  BILITOT 0.5  PROT 8.6*  ALBUMIN 2.9*   No results for input(s): LIPASE, AMYLASE in the last 168 hours. No results for input(s): AMMONIA in the last 168 hours. Coagulation Profile: No results for input(s): INR,  PROTIME in the last 168 hours. Cardiac Enzymes: No results for input(s): CKTOTAL, CKMB, CKMBINDEX, TROPONINI in the last 168 hours. CBG: Recent Labs  Lab 01/01/18 2159 01/02/18 0745  GLUCAP 144* 109*   Urine analysis:    Component Value Date/Time   COLORURINE STRAW (A) 12/20/2017 1150   APPEARANCEUR CLEAR 12/20/2017 1150   LABSPEC 1.028 12/20/2017 1150   PHURINE 5.0 12/20/2017 1150   GLUCOSEU >=500 (A) 12/20/2017 1150   HGBUR SMALL (A) 12/20/2017 1150   BILIRUBINUR NEGATIVE 12/20/2017 1150   KETONESUR 80 (A) 12/20/2017 1150   PROTEINUR NEGATIVE 12/20/2017 1150   NITRITE NEGATIVE 12/20/2017 1150   LEUKOCYTESUR NEGATIVE 12/20/2017 1150     Radiology Studies: Reviewed images personally in health database    Scheduled Meds: . aspirin EC  81 mg Oral Daily  . enoxaparin (LOVENOX) injection  80 mg Subcutaneous Q24H  . Gerhardt's butt cream  1 application Topical QID  . insulin aspart  0-15 Units Subcutaneous TID WC  . insulin aspart  0-5 Units Subcutaneous QHS  . insulin detemir  25 Units Subcutaneous BID  . metoprolol tartrate  37.5 mg Oral BID  . simvastatin  20 mg Oral q1800   Continuous Infusions: . vancomycin       LOS: 1 day    Time spent: 60    Pleas Koch, MD [facility] ([facility]   If 7PM-7AM, please contact night-coverage [email or web address] Password [email or web address] 01/02/2018, 8:02 AM

## 2018-01-02 NOTE — Progress Notes (Addendum)
Initial Nutrition Assessment  DOCUMENTATION CODES:   Morbid obesity  INTERVENTION:    Juven 1 packet BID, each packet provides 80 calories, 8 grams of carbohydrate, and 14 grams of amino acids; supplement contains CaHMB, glutamine, and arginine, to promote wound healing  Pro-stat 30 ml PO BID, each supplement provides 100 kcal and 15 gm protein  Multivitamin daily  NUTRITION DIAGNOSIS:   Increased nutrient needs related to wound healing as evidenced by estimated needs.  GOAL:   Patient will meet greater than or equal to 90% of their needs  MONITOR:   PO intake, Supplement acceptance, Skin  REASON FOR ASSESSMENT:   Malnutrition Screening Tool    ASSESSMENT:   52 yo male with PMH of HTN, morbid obesity, tobacco abuse, OSA, DM, HTN, CHF, HLD who was admitted on 3/1 with an abscess to his R buttock. S/P I&D at bedside 3/2.  Patient reports some weight fluctuations lately, likely related to fluid shifts. Overall, weight has been stable for the past year.  Discussed ways to increase protein intake to ensure healing. Reviewed good sources of protein. Patient willing to drink Juven supplement BID to promote wound healing. Current intake is excellent; patient is consuming 100% of meals. Labs and medications reviewed.   NUTRITION - FOCUSED PHYSICAL EXAM:    Most Recent Value  Orbital Region  No depletion  Upper Arm Region  No depletion  Thoracic and Lumbar Region  No depletion  Buccal Region  No depletion  Temple Region  No depletion  Clavicle Bone Region  No depletion  Clavicle and Acromion Bone Region  No depletion  Scapular Bone Region  No depletion  Dorsal Hand  No depletion  Patellar Region  No depletion  Anterior Thigh Region  No depletion  Posterior Calf Region  No depletion  Edema (RD Assessment)  Mild  Hair  Reviewed  Eyes  Reviewed  Mouth  Reviewed  Skin  Reviewed  Nails  Reviewed       Diet Order:  Diet Carb Modified Fluid consistency: Thin; Room  service appropriate? Yes  EDUCATION NEEDS:   Education needs have been addressed  Skin:  Skin Assessment: Skin Integrity Issues: Skin Integrity Issues:: Other (Comment) Other: R buttock abscess  Last BM:  2/28  Height:   Ht Readings from Last 1 Encounters:  01/01/18 6\' 3"  (1.905 m)    Weight:   Wt Readings from Last 1 Encounters:  01/02/18 (!) 341 lb 11.4 oz (155 kg)    Ideal Body Weight:  89.1 kg  BMI:  Body mass index is 42.71 kg/m.  Estimated Nutritional Needs:   Kcal:  2200-2400  Protein:  160-175 gm  Fluid:  2.2-2.4 L    Joaquin Courts, RD, LDN, CNSC Pager [phone] After Hours Pager [phone]

## 2018-01-02 NOTE — Consult Note (Signed)
Surgical Consultation Requesting provider: Dr. Verlon Au  CC: gluteal abscess  HPI: 52yo obese diabetic with NICM EF 50%, afib and sleep apnea re-admitted with progressive cellulitis of the right buttock. Was discharged 2/23 following admission for the same and DKA. Returned yesterday, CT shows possible abscess.  Now having purulent drainage from right buttock. No prior procedures in this area. Ate breakfast around 10:30am.  Allergies  Allergen Reactions  . Demerol [Meperidine] Itching  . Morphine And Related Itching    Past Medical History:  Diagnosis Date  . Abnormal nuclear stress test 03/24/2016  . Atrial fibrillation ([place])   . CHF (congestive heart failure) ([place])   . DM (diabetes mellitus) ([place])   . Essential hypertension   . History of tobacco abuse   . HTN (hypertension)   . Hyperlipidemia   . Morbid obesity ([place])   . OSA (obstructive sleep apnea)   . Pneumonia 2017    Past Surgical History:  Procedure Laterality Date  . CARDIAC CATHETERIZATION N/A 03/24/2016   Procedure: Left Heart Cath and Coronary Angiography;  Surgeon: Leonie Man, MD;  Location: [facility];  Service: Cardiovascular;  Laterality: N/A;  . FEMUR IM NAIL Right 2012  . FRACTURE SURGERY      Family History  Problem Relation Age of Onset  . Diabetes Mellitus II Mother   . Diabetes type II Other   . Hypertension Maternal Grandfather   . Stroke Neg Hx   . Cancer Neg Hx     Social History   Socioeconomic History  . Marital status: Single    Spouse name: None  . Number of children: 1  . Years of education: 97  . Highest education level: None  Social Needs  . Financial resource strain: None  . Food insecurity - worry: None  . Food insecurity - inability: None  . Transportation needs - medical: None  . Transportation needs - non-medical: None  Occupational History  . Occupation: [employer]  Tobacco Use  . Smoking status: Former Smoker    Packs/day: 0.50    Years: 20.00    Pack  years: 10.00    Types: Cigarettes    Last attempt to quit: 03/02/2009    Years since quitting: 8.8  . Smokeless tobacco: Never Used  Substance and Sexual Activity  . Alcohol use: No  . Drug use: No  . Sexual activity: No  Other Topics Concern  . None  Social History Narrative   ** Merged History Encounter **       Lives in [place] with mother and three grandchildren.     Works for [employer] - works from home.     Does not routinely exercise.   Caffeine use: none   Fun/Hobby: Play video games and play with grandchildren.     No current facility-administered medications on file prior to encounter.    Current Outpatient Medications on File Prior to Encounter  Medication Sig Dispense Refill  . albuterol (PROVENTIL HFA;VENTOLIN HFA) 108 (90 Base) MCG/ACT inhaler Inhale 2 puffs into the lungs every 4 (four) hours as needed for wheezing. 1 Inhaler 0  . aspirin EC 81 MG EC tablet Take 1 tablet (81 mg total) by mouth daily. 30 tablet 0  . cephALEXin (KEFLEX) 500 MG capsule Take 1 capsule (500 mg total) by mouth every 6 (six) hours for 7 days. 28 capsule 0  . Hydrocortisone (GERHARDT'S BUTT CREAM) CREA Apply 1 application topically 4 (four) times daily. 1 each 0  .  insulin aspart protamine- aspart (NOVOLOG MIX 70/30) (70-30) 100 UNIT/ML injection Inject 0.45 mLs (45 Units total) into the skin 2 (two) times daily with a meal. 10 mL 11  . Metoprolol Tartrate 37.5 MG TABS Take 37.5 mg by mouth 2 (two) times daily. 180 tablet 0  . oxyCODONE-acetaminophen (PERCOCET) 10-325 MG tablet Take 1 tablet by mouth every 8 (eight) hours as needed for pain. 20 tablet 0  . Saccharomyces boulardii (PROBIOTIC) 250 MG CAPS Take 250 mg by mouth daily.    . simvastatin (ZOCOR) 20 MG tablet Take 1 tablet (20 mg total) by mouth at bedtime. 90 tablet 3  . torsemide (DEMADEX) 10 MG tablet Take 1 - 2 tablets daily for swelling 180 tablet 1  . blood glucose meter kit and supplies Dispense based on patient  and insurance preference. Use up to four times daily as directed. (FOR ICD-10 E10.9, E11.9). 1 each 0  . losartan (COZAAR) 25 MG tablet Take 1 tablet (25 mg total) by mouth daily. (Patient not taking: Reported on 12/28/2017) 30 tablet 2  . Syringe, Disposable, (10-12CC SYRINGE) 12 ML MISC 1 Units by Does not apply route 4 (four) times daily. Insulin syringes with needles. 120 each 0  . traZODone (DESYREL) 50 MG tablet Take 0.5-1 tablets (25-50 mg total) by mouth at bedtime as needed for sleep. 30 tablet 3    Review of Systems: a complete, 10pt review of systems was completed with pertinent positives and negatives as documented in the HPI  Physical Exam: Vitals:   01/01/18 2201 01/02/18 0506  BP: 120/65 107/75  Pulse: 100 80  Resp: 18 18  Temp: 97.9 F (36.6 C) 98.4 F (36.9 C)  SpO2: 97% 97%   Gen: A&Ox3, no distress  Head: normocephalic, atraumatic Eyes: extraocular motions intact, anicteric.  Neck: supple without mass or thyromegaly Chest: unlabored respirations, symmetrical air entry, clear bilaterally   Cardiovascular: RRR with palpable distal pulses, no pedal edema Abdomen: soft, nondistended, nontender. No mass or organomegaly.  Extremities: warm, without edema, no deformities  Neuro: grossly intact Psych: appropriate mood and affect, normal insight  Skin: Induration and fluctuance right buttock with purulent drainage   CBC Latest Ref Rng & Units 01/02/2018 01/01/2018 01/01/2018  WBC 4.0 - 10.5 K/uL 10.9(H) 13.4(H) 12.8(H)  Hemoglobin 13.0 - 17.0 g/dL 13.5 14.7 14.2  Hematocrit 39.0 - 52.0 % 42.0 44.5 41.9  Platelets 150 - 400 K/uL 349 373 390.0    CMP Latest Ref Rng & Units 01/02/2018 01/01/2018 01/01/2018  Glucose 65 - 99 mg/dL 147(H) 110(H) 128(H)  BUN 6 - 20 mg/dL '13 11 13  '$ Creatinine 0.61 - 1.24 mg/dL 1.76(H) 1.58(H) 1.43  Sodium 135 - 145 mmol/L 137 137 138  Potassium 3.5 - 5.1 mmol/L 3.1(L) 3.1(L) 3.0(L)  Chloride 101 - 111 mmol/L 102 102 103  CO2 22 - 32 mmol/L 22 21(L)  24  Calcium 8.9 - 10.3 mg/dL 7.8(L) 8.1(L) 8.5  Total Protein 6.0 - 8.3 g/dL - - 8.6(H)  Total Bilirubin 0.2 - 1.2 mg/dL - - 0.5  Alkaline Phos 39 - 117 U/L - - 103  AST 0 - 37 U/L - - 23  ALT 0 - 53 U/L - - 28    Lab Results  Component Value Date   INR 1.15 03/19/2016    Imaging: Ct Abdomen Pelvis Wo Contrast  Result Date: 01/01/2018 CLINICAL DATA:  Right buttocks inflammation. EXAM: CT ABDOMEN AND PELVIS WITHOUT CONTRAST TECHNIQUE: Multidetector CT imaging of the abdomen and pelvis was  performed following the standard protocol without IV contrast. IV access could not be obtained. COMPARISON:  CT scan of December 23, 2017. FINDINGS: Lower chest: No acute abnormality. Hepatobiliary: No gallstones are noted. Fatty infiltration of the liver is noted. No focal lesion is noted. Pancreas: Unremarkable. No pancreatic ductal dilatation or surrounding inflammatory changes. Spleen: Normal in size without focal abnormality. Adrenals/Urinary Tract: Adrenal glands are unremarkable. Kidneys are normal, without renal calculi, focal lesion, or hydronephrosis. Bladder is unremarkable. Stomach/Bowel: Stomach is within normal limits. Appendix appears normal. No evidence of bowel wall thickening, distention, or inflammatory changes. Vascular/Lymphatic: No significant vascular findings are present. No enlarged abdominal or pelvic lymph nodes. Reproductive: Prostate is unremarkable. Other: No hernia is noted. There are multiple low densities are seen involving the medial and posterior portion of the right buttocks concerning for worsening cellulitis. Abscess or abscesses may be present, but cannot be diagnosed with certainty due the lack of intravenous contrast. Musculoskeletal: No acute or significant osseous findings. IMPRESSION: Large area of abnormal low density is seen involving the medial and posterior portion of the right buttocks consistent with significantly worsening cellulitis with possible underlying abscess  or abscesses. The presence or absence of abscess cannot be certain due to the lack of intravenous contrast. Electronically Signed   By: Marijo Conception, M.D.   On: 01/01/2018 15:14      A/P: 52yo man with multiple comorbidities and right gluteal abscess. He ate 15 minutes ago. Will attempt bedside I&D.    Romana Juniper, MD [facility], [place] Pager [phone]

## 2018-01-02 NOTE — Procedures (Signed)
Procedure Note  Luis [name]  [id number]  [id number]  01/02/2018   Surgeon: Leeroy Bock A ConnorMD  Assistant: None  Procedure performed: incision and debridement of large right gluteal abscess  Preop diagnosis: gluteal abscess  Post-op diagnosis/intraop findings: same  Specimens: cultures Retained items: kerlex packing to be changed in 24h EBL: minimal cc Complications: none  Description of procedure: After obtaining informed consent the patient was positioned in the left lateral decubitus position on his bed. The area of fluctuance on the right buttock was prepped with betadine and the skin infiltrated with 0.5% marcaine with epinephrine. An 11-blade was used to make a 4-5cm incision over the region of fluctuance. The wound was gently bluntly probed first with a hemostat and then digitally in order to evacuate about of purulent fluid. The pus was cultured. Once all loculations had been drained, a saline moistened kerlix was packed into the wound and a dry dressing applied. The patient tolerated the procedure very well.  He will need the packing changed in 24h. Continue Iv antibiotics and glycemic control.

## 2018-01-03 ENCOUNTER — Other Ambulatory Visit: Payer: Self-pay

## 2018-01-03 LAB — GLUCOSE, CAPILLARY
GLUCOSE-CAPILLARY: 123 mg/dL — AB (ref 65–99)
Glucose-Capillary: 101 mg/dL — ABNORMAL HIGH (ref 65–99)
Glucose-Capillary: 148 mg/dL — ABNORMAL HIGH (ref 65–99)
Glucose-Capillary: 191 mg/dL — ABNORMAL HIGH (ref 65–99)

## 2018-01-03 MED ORDER — SACCHAROMYCES BOULARDII 250 MG PO CAPS
250.0000 mg | ORAL_CAPSULE | Freq: Two times a day (BID) | ORAL | Status: DC
  Administered 2018-01-03 – 2018-01-04 (×3): 250 mg via ORAL
  Filled 2018-01-03 (×3): qty 1

## 2018-01-03 NOTE — Progress Notes (Signed)
Hospitalist progress note   Luis [name]  ZOX:[id number] DOB: May 28, 1966 DOA: 01/01/2018 PCP: Helane Rima, DO   Specialists:   Brief Narrative:  52 year old male Diabetes mellitus type 2 Obesity HTN Prior right femoral shaft fracture 2005 Atrial fibrillation chads score >2 Prior tobacco abuse OSA currently getting CPAP titration Chronic atrial fibrillation nonischemic cardiomyopathy EF 35-40%-normal left heart cath 03/2016  Admission 4/29-5/15 acute respiratory failure secondary to influenza pneumonia Recent admission 2/17-2/23 fatigue dehydration and found to have nonpurulent gluteus cellulitis in addition to diabetic ketoacidosis transitioned to 7030 insulin for better compliance returns to emergency room with significant worsening cellulitis  Potassium 3.1 bicarb 21 creatinine 1.5, baseline 1.2 White count 14 lactic acid 2.2   Assessment & Plan:   Assessment:  The primary encounter diagnosis was Cellulitis of right buttock. A diagnosis of Diabetes mellitus type 2, insulin dependent (HCC) was also pertinent to this visit.  Cellulitis-CT without contrast concerning for possible abscess.   continue IV vancomycin S/p I and D 3/2--dfer surgery wound care and abx  Chronic kidney disease stage II-III-BUN/creatinine around baseline.  Hydrate with IV saline and 20 of K 100 cc/h Rpt labs in am  Hypokalemia on admission potassium was 3.0 adding K Dur 40 mEq daily  Insulin-dependent diabetes mellitus recent DKA-A1c 11.0-home dose 70 30 is45 units twice daily, currently on Levemir 25 twice daily --suagrs reasonable in 101-150 range  Nonischemic cardiomyopathy now resolved with EF 50-55%-careful with fluid.  Previous on torsemide 10-20 mg at bedtime.  Monitor fluid status, continue losartan on discharge 25 if creatinine better, metoprolol 37.5 reordered  Depression/sleep continue trazodone 25-50 at bedtime as needed  DVT prophylaxis: lovenox  Code Status:   full   Family  Communication:   inpatient  Disposition Plan: await resolution of abcess   Consultants:   surgery  Procedures:   I & D buttcks bedside  Antimicrobials:    Vanc  Subjective:  Overall well No issue pain in incision site buttocks not severe  No cp/no fever  Objective: Vitals:   01/02/18 2127 01/02/18 2142 01/03/18 0448 01/03/18 1024  BP: 123/75 123/75 124/68 (!) 153/89  Pulse: 87 87 78 93  Resp: 19  20   Temp: 99 F (37.2 C)  98.5 F (36.9 C)   TempSrc: Oral     SpO2: 97%  97%   Weight:      Height:        Intake/Output Summary (Last 24 hours) at 01/03/2018 1052 Last data filed at 01/03/2018 0900 Gross per 24 hour  Intake 1850 ml  Output 1100 ml  Net 750 ml   Filed Weights   01/01/18 1604 01/02/18 0506  Weight: (!) 156.9 kg (346 lb) (!) 155 kg (341 lb 11.4 oz)    Examination:  Obese pleasant in nad s1 s 2no m CTA B Wound not examined Oriented, neuro grossly intact  Data Reviewed: I have personally reviewed following labs and imaging studies  CBC: Recent Labs  Lab 01/01/18 1029 01/01/18 1648 01/02/18 0324  WBC 12.8* 13.4* 10.9*  NEUTROABS 8.8* 8.9* 6.6  HGB 14.2 14.7 13.5  HCT 41.9 44.5 42.0  MCV 92.9 95.1 95.7  PLT 390.0 373 349   Basic Metabolic Panel: Recent Labs  Lab 01/01/18 1029 01/01/18 1648 01/02/18 0324  NA 138 137 137  K 3.0* 3.1* 3.1*  CL 103 102 102  CO2 24 21* 22  GLUCOSE 128* 110* 147*  BUN 13 11 13   CREATININE 1.43 1.58* 1.76*  CALCIUM 8.5 8.1* 7.8*  MG 2.0  --   --   PHOS 2.1*  --   --    GFR: Estimated Creatinine Clearance: 80.9 mL/min (A) (by C-G formula based on SCr of 1.76 mg/dL (H)). Liver Function Tests: Recent Labs  Lab 01/01/18 1029  AST 23  ALT 28  ALKPHOS 103  BILITOT 0.5  PROT 8.6*  ALBUMIN 2.9*   No results for input(s): LIPASE, AMYLASE in the last 168 hours. No results for input(s): AMMONIA in the last 168 hours. Coagulation Profile: No results for input(s): INR, PROTIME in the last 168  hours. Cardiac Enzymes: No results for input(s): CKTOTAL, CKMB, CKMBINDEX, TROPONINI in the last 168 hours. CBG: Recent Labs  Lab 01/02/18 0745 01/02/18 1224 01/02/18 1658 01/02/18 2122 01/03/18 0758  GLUCAP 109* 156* 181* 137* 101*   Urine analysis:    Component Value Date/Time   COLORURINE STRAW (A) 12/20/2017 1150   APPEARANCEUR CLEAR 12/20/2017 1150   LABSPEC 1.028 12/20/2017 1150   PHURINE 5.0 12/20/2017 1150   GLUCOSEU >=500 (A) 12/20/2017 1150   HGBUR SMALL (A) 12/20/2017 1150   BILIRUBINUR NEGATIVE 12/20/2017 1150   KETONESUR 80 (A) 12/20/2017 1150   PROTEINUR NEGATIVE 12/20/2017 1150   NITRITE NEGATIVE 12/20/2017 1150   LEUKOCYTESUR NEGATIVE 12/20/2017 1150     Radiology Studies: Reviewed images personally in health database    Scheduled Meds: . aspirin EC  81 mg Oral Daily  . enoxaparin (LOVENOX) injection  80 mg Subcutaneous Q24H  . feeding supplement (PRO-STAT SUGAR FREE 64)  30 mL Oral BID WC  . Gerhardt's butt cream  1 application Topical QID  . insulin aspart  0-15 Units Subcutaneous TID WC  . insulin aspart  0-5 Units Subcutaneous QHS  . insulin detemir  25 Units Subcutaneous BID  . metoprolol tartrate  37.5 mg Oral BID  . multivitamin with minerals  1 tablet Oral Daily  . nutrition supplement (JUVEN)  1 packet Oral BID BM  . potassium chloride  40 mEq Oral Daily  . saccharomyces boulardii  250 mg Oral BID  . simvastatin  20 mg Oral q1800   Continuous Infusions: . 0.9 % NaCl with KCl 20 mEq / L 100 mL/hr at 01/03/18 0500  . vancomycin Stopped (01/03/18 0008)     LOS: 2 days    Time spent: 90    Pleas Koch, MD [facility]   If 7PM-7AM, please contact night-coverage [email or web address] Password [facility] 01/03/2018, 10:52 AM

## 2018-01-03 NOTE — Progress Notes (Signed)
[facility] Progress Note     Subjective: CC-  No complaints this morning. He reports only 2/10 pain from right gluteal abscess. He refused labs this morning. VSS.  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  Objective: Vital signs in last 24 hours: Temp:  [97.5 F (36.4 C)-99 F (37.2 C)] 98.5 F (36.9 C) (03/03 0448) Pulse Rate:  [78-100] 78 (03/03 0448) Resp:  [18-20] 20 (03/03 0448) BP: (110-124)/(68-75) 124/68 (03/03 0448) SpO2:  [95 %-97 %] 97 % (03/03 0448) Last BM Date: 01/02/18  Intake/Output from previous day: 03/02 0701 - 03/03 0700 In: 1700 [P.O.:200; I.V.:1000; IV Piggyback:500] Out: 1100 [Urine:1100] Intake/Output this shift: No intake/output data recorded.  PE: Gen:  Alert, NAD, pleasant HEENT: EOM's intact, pupils equal and round Card:  RRR, no M/G/R heard Pulm:  CTAB, no W/R/R, effort normal Abd: obese, soft, NT/ND, +BS, no HSM, no hernia Ext:  Calves nontender Psych: A&Ox3  GU: right gluteal abscess s/p I&D >> packing removed, trace bloody drainage, mild induration surrounding wound  Lab Results:  Recent Labs    01/01/18 1648 01/02/18 0324  WBC 13.4* 10.9*  HGB 14.7 13.5  HCT 44.5 42.0  PLT 373 349   BMET Recent Labs    01/01/18 1648 01/02/18 0324  NA 137 137  K 3.1* 3.1*  CL 102 102  CO2 21* 22  GLUCOSE 110* 147*  BUN 11 13  CREATININE 1.58* 1.76*  CALCIUM 8.1* 7.8*   PT/INR No results for input(s): LABPROT, INR in the last 72 hours. CMP     Component Value Date/Time   NA 137 01/02/2018 0324   NA 138 03/31/2017 1315   K 3.1 (L) 01/02/2018 0324   CL 102 01/02/2018 0324   CO2 22 01/02/2018 0324   GLUCOSE 147 (H) 01/02/2018 0324   BUN 13 01/02/2018 0324   BUN 10 03/31/2017 1315   CREATININE 1.76 (H) 01/02/2018 0324   CALCIUM 7.8 (L) 01/02/2018 0324   PROT 8.6 (H) 01/01/2018 1029   ALBUMIN 2.9 (L) 01/01/2018 1029   AST 23 01/01/2018 1029   ALT 28 01/01/2018 1029   ALKPHOS 103 01/01/2018 1029   BILITOT  0.5 01/01/2018 1029   GFRNONAA 44 (L) 01/02/2018 0324   GFRAA 51 (L) 01/02/2018 0324   Lipase     Component Value Date/Time   LIPASE 24 12/20/2017 1356       Studies/Results: Ct Abdomen Pelvis Wo Contrast  Result Date: 01/01/2018 CLINICAL DATA:  Right buttocks inflammation. EXAM: CT ABDOMEN AND PELVIS WITHOUT CONTRAST TECHNIQUE: Multidetector CT imaging of the abdomen and pelvis was performed following the standard protocol without IV contrast. IV access could not be obtained. COMPARISON:  CT scan of December 23, 2017. FINDINGS: Lower chest: No acute abnormality. Hepatobiliary: No gallstones are noted. Fatty infiltration of the liver is noted. No focal lesion is noted. Pancreas: Unremarkable. No pancreatic ductal dilatation or surrounding inflammatory changes. Spleen: Normal in size without focal abnormality. Adrenals/Urinary Tract: Adrenal glands are unremarkable. Kidneys are normal, without renal calculi, focal lesion, or hydronephrosis. Bladder is unremarkable. Stomach/Bowel: Stomach is within normal limits. Appendix appears normal. No evidence of bowel wall thickening, distention, or inflammatory changes. Vascular/Lymphatic: No significant vascular findings are present. No enlarged abdominal or pelvic lymph nodes. Reproductive: Prostate is unremarkable. Other: No hernia is noted. There are multiple low densities are seen involving the medial and posterior portion of the right buttocks concerning for worsening cellulitis. Abscess or abscesses may be present, but cannot be diagnosed  with certainty due the lack of intravenous contrast. Musculoskeletal: No acute or significant osseous findings. IMPRESSION: Large area of abnormal low density is seen involving the medial and posterior portion of the right buttocks consistent with significantly worsening cellulitis with possible underlying abscess or abscesses. The presence or absence of abscess cannot be certain due to the lack of intravenous contrast.  Electronically Signed   By: Lupita Raider, M.D.   On: 01/01/2018 15:14    Anti-infectives: Anti-infectives (From admission, onward)   Start     Dose/Rate Route Frequency Ordered Stop   01/02/18 2000  vancomycin (VANCOCIN) 1,750 mg in sodium chloride 0.9 % 500 mL IVPB     1,750 mg 250 mL/hr over 120 Minutes Intravenous Every 24 hours 01/01/18 1948     01/01/18 2000  vancomycin (VANCOCIN) 2,500 mg in sodium chloride 0.9 % 500 mL IVPB     2,500 mg 250 mL/hr over 120 Minutes Intravenous  Once 01/01/18 1948 01/02/18 0410   01/01/18 1945  vancomycin (VANCOCIN) IVPB 1000 mg/200 mL premix  Status:  Discontinued     1,000 mg 200 mL/hr over 60 Minutes Intravenous  Once 01/01/18 1936 01/01/18 1948       Assessment/Plan DM HTN Obese CHF  Large right gluteal abscess s/p bedside incision and debridement - culture pending - First dressing change performed, continue once daily. Ok to shower with wound open. Continue antibiotics. Patient will need home health for assistance with wound care.  ID - vancomycin 3/1>> FEN - carb modified diet VTE - lovenox Foley - none Follow up - [facility]   LOS: 2 days    Franne Forts , [facility] 01/03/2018, 8:27 AM Pager: [phone] Consults: [phone] Mon-Fri 7:00 am-4:30 pm Sat-Sun 7:00 am-11:30 am

## 2018-01-03 NOTE — Discharge Instructions (Signed)
WOUND CARE: - dressing to be changed daily - supplies: sterile saline, kerlix, scissors, ABD pads, tape  - remove dressing and all packing carefully, moistening with sterile saline as needed to avoid packing/internal dressing sticking to the wound. - dampen and clean kerlix with sterile saline and pack wound from wound base to skin level. Wound can be packed loosely. Trim kerlix to size if a whole kerlix is not required. - cover wound with a dry ABD pad and secure with tape.  - write the date/time on the dry dressing/tape to better track when the last dressing change occurred. - change dressing as needed if leakage occurs, wound gets contaminated, or patient requests to shower. - patient may shower daily with wound open and following the shower the wound should be dried and a clean dressing placed.

## 2018-01-03 NOTE — Progress Notes (Signed)
Patient refused lab works this am for the reason of being stuck many times already, advised him of the importance but continues to refuse.

## 2018-01-04 ENCOUNTER — Telehealth: Payer: Self-pay | Admitting: Family Medicine

## 2018-01-04 ENCOUNTER — Encounter: Payer: Self-pay | Admitting: Family Medicine

## 2018-01-04 LAB — RENAL FUNCTION PANEL
ANION GAP: 9 (ref 5–15)
Albumin: 2.4 g/dL — ABNORMAL LOW (ref 3.5–5.0)
BUN: 18 mg/dL (ref 6–20)
CALCIUM: 8 mg/dL — AB (ref 8.9–10.3)
CO2: 22 mmol/L (ref 22–32)
Chloride: 110 mmol/L (ref 101–111)
Creatinine, Ser: 1.4 mg/dL — ABNORMAL HIGH (ref 0.61–1.24)
GFR calc Af Amer: >60 mL/min (ref >60)
GFR calc non Af Amer: 58 mL/min — ABNORMAL LOW (ref >60)
Glucose, Bld: 169 mg/dL — ABNORMAL HIGH (ref 65–99)
PHOSPHORUS: 2.3 mg/dL — AB (ref 2.5–4.6)
Potassium: 3.7 mmol/L (ref 3.5–5.1)
SODIUM: 141 mmol/L (ref 135–145)

## 2018-01-04 LAB — CBC WITH DIFFERENTIAL/PLATELET
BASOS ABS: 0 10*3/uL (ref 0.0–0.1)
BASOS PCT: 0 %
Eosinophils Absolute: 0.1 10*3/uL (ref 0.0–0.7)
Eosinophils Relative: 3 %
HEMATOCRIT: 39.1 % (ref 39.0–52.0)
HEMOGLOBIN: 12.4 g/dL — AB (ref 13.0–17.0)
Lymphocytes Relative: 48 %
Lymphs Abs: 2.3 10*3/uL (ref 0.7–4.0)
MCH: 31 pg (ref 26.0–34.0)
MCHC: 31.7 g/dL (ref 30.0–36.0)
MCV: 97.8 fL (ref 78.0–100.0)
MONOS PCT: 7 %
Monocytes Absolute: 0.3 10*3/uL (ref 0.1–1.0)
NEUTROS ABS: 2 10*3/uL (ref 1.7–7.7)
Neutrophils Relative %: 42 %
Platelets: 329 10*3/uL (ref 150–400)
RBC: 4 MIL/uL — AB (ref 4.22–5.81)
RDW: 14.3 % (ref 11.5–15.5)
WBC: 4.8 10*3/uL (ref 4.0–10.5)

## 2018-01-04 LAB — GLUCOSE, CAPILLARY
GLUCOSE-CAPILLARY: 174 mg/dL — AB (ref 65–99)
Glucose-Capillary: 118 mg/dL — ABNORMAL HIGH (ref 65–99)

## 2018-01-04 LAB — AEROBIC CULTURE  (SUPERFICIAL SPECIMEN)

## 2018-01-04 LAB — AEROBIC CULTURE W GRAM STAIN (SUPERFICIAL SPECIMEN): Culture: NO GROWTH

## 2018-01-04 MED ORDER — DOXYCYCLINE HYCLATE 100 MG PO TABS
100.0000 mg | ORAL_TABLET | Freq: Two times a day (BID) | ORAL | Status: DC
  Filled 2018-01-04: qty 1

## 2018-01-04 MED ORDER — "GAUZE DRESSING 4""X4"" PADS": 1.0000 | MEDICATED_PAD | Freq: Two times a day (BID) | 0 refills | Status: DC

## 2018-01-04 NOTE — Discharge Summary (Signed)
Physician Discharge Summary  Luis [name] GEZ:[id number] DOB: October 31, 1966 DOA: 01/01/2018  PCP: Briscoe Deutscher, DO  Admit date: 01/01/2018 Discharge date: 01/04/2018  Time spent: 25 minutes  Recommendations for Outpatient Follow-up:  1. Needs dressing changes with hh daily 2. No changes to other meds Needs bmet 1 week as mild aki during hospital stay  Discharge Diagnoses:  Principal Problem:   Abscess of right buttock Active Problems:   Chronic diastolic CHF (congestive heart failure) ([place]), ECHO 03/2017 EF 50-55%, on Torsemide   Diabetes mellitus type 2, insulin dependent (HCC)   OSA (obstructive sleep apnea), mild, not using CPAP   Hypokalemia   Renal insufficiency   Discharge Condition: improved  Diet recommendation: diabetic  Filed Weights   01/01/18 1604 01/02/18 0506 01/04/18 0500  Weight: (!) 156.9 kg (346 lb) (!) 155 kg (341 lb 11.4 oz) (!) 159.4 kg (351 lb 6.6 oz)    History of present illness:  52 year old male Diabetes mellitus type 2 Obesity HTN Prior right femoral shaft fracture 2005 Atrial fibrillation chads score >2 Prior tobacco abuse OSA currently getting CPAP titration Chronic atrial fibrillation nonischemic cardiomyopathy EF 35-40%-normal left heart cath 03/2016  Admission 4/29-5/15 acute respiratory failure secondary to influenza pneumonia Recent admission 2/17-2/23 fatigue dehydration and found to have nonpurulent gluteus cellulitis in addition to diabetic ketoacidosis transitioned to 7030 insulin for better compliance returns to emergency room with significant worsening cellulitis  Potassium 3.1 bicarb 21 creatinine 1.5, baseline 1.2 White count 14 lactic acid 2.2    Hospital Course:  Cellulitis-CT without contrast concerning for possible abscess.   continue IV vancomycin S/p I and D 3/2--dfer surgery --needs dressings daily  Chronic kidney disease stage II-III-BUN/creatinine around baseline.  Hydrate with IV saline and 20 of K 100 cc/h Rpt  labs in am  Hypokalemia on admission potassium was 3.0 adding K Dur 40 mEq daily-resolved on d/c  Insulin-dependent diabetes mellitus recent DKA-A1c 11.0-home dose 70 30 is 45 units twice daily, currently on Levemir 25 twice daily --suagrs reasonable Resumed home regimen on d/c  Nonischemic cardiomyopathy now resolved with EF 50-55%-careful with fluid.  Previous on torsemide 10-20 mg at bedtime.  Monitor fluid status,resumed losartan on discharge 25 if creatinine better, metoprolol 37.5 reordered  Depression/sleep continue trazodone 25-50 at bedtime as needed     Procedures:  S/p I & D in hospital   Consultations:  gen srugery  Discharge Exam: Vitals:   01/03/18 2234 01/04/18 0549  BP: (!) 153/95 (!) 151/95  Pulse: 92 74  Resp: 19 18  Temp: 98.3 F (36.8 C) 98.5 F (36.9 C)  SpO2: 97% 100%    General: awake alert no distress Cardiovascular: eomi, ncat Respiratory: cvlear Wound not examined  Discharge Instructions   Discharge Instructions    Diet - low sodium heart healthy   Complete by:  As directed    Discharge instructions   Complete by:  As directed    You will need to resume all of your medications   Increase activity slowly   Complete by:  As directed      Allergies as of 01/04/2018      Reactions   Demerol [meperidine] Itching   Morphine And Related Itching      Medication List    STOP taking these medications   cephALEXin 500 MG capsule Commonly known as:  KEFLEX     TAKE these medications   10-12CC SYRINGE 12 ML Misc 1 Units by Does not apply route 4 (four) times daily. Insulin syringes  with needles.   albuterol 108 (90 Base) MCG/ACT inhaler Commonly known as:  PROVENTIL HFA;VENTOLIN HFA Inhale 2 puffs into the lungs every 4 (four) hours as needed for wheezing.   aspirin 81 MG EC tablet Take 1 tablet (81 mg total) by mouth daily.   blood glucose meter kit and supplies Dispense based on patient and insurance preference. Use up to  four times daily as directed. (FOR ICD-10 E10.9, E11.9).   Gauze Dressing 4"X4" Pads 1 each by Does not apply route 2 (two) times daily.   Gerhardt's butt cream Crea Apply 1 application topically 4 (four) times daily.   insulin aspart protamine- aspart (70-30) 100 UNIT/ML injection Commonly known as:  NOVOLOG MIX 70/30 Inject 0.45 mLs (45 Units total) into the skin 2 (two) times daily with a meal.   losartan 25 MG tablet Commonly known as:  COZAAR Take 1 tablet (25 mg total) by mouth daily.   Metoprolol Tartrate 37.5 MG Tabs Take 37.5 mg by mouth 2 (two) times daily.   oxyCODONE-acetaminophen 10-325 MG tablet Commonly known as:  PERCOCET Take 1 tablet by mouth every 8 (eight) hours as needed for pain.   Probiotic 250 MG Caps Take 250 mg by mouth daily.   simvastatin 20 MG tablet Commonly known as:  ZOCOR Take 1 tablet (20 mg total) by mouth at bedtime.   torsemide 10 MG tablet Commonly known as:  DEMADEX Take 1 - 2 tablets daily for swelling   traZODone 50 MG tablet Commonly known as:  DESYREL Take 0.5-1 tablets (25-50 mg total) by mouth at bedtime as needed for sleep.      Allergies  Allergen Reactions  . Demerol [Meperidine] Itching  . Morphine And Related Itching   Follow-up [facility], [place]. Go on 01/12/2018.   Specialty:  General Surgery Why:  Your appointment is 03/12 at 11:30 am.  Please arrive 30 minutes prior to your appointment to check in and fill out paperwork. Bring photo ID and insurance information. Contact information: [address] [place] [phone]           The results of significant diagnostics from this hospitalization (including imaging, microbiology, ancillary and laboratory) are listed below for reference.    Significant Diagnostic Studies: Ct Abdomen Pelvis Wo Contrast  Result Date: 01/01/2018 CLINICAL DATA:  Right buttocks inflammation. EXAM: CT ABDOMEN  AND PELVIS WITHOUT CONTRAST TECHNIQUE: Multidetector CT imaging of the abdomen and pelvis was performed following the standard protocol without IV contrast. IV access could not be obtained. COMPARISON:  CT scan of December 23, 2017. FINDINGS: Lower chest: No acute abnormality. Hepatobiliary: No gallstones are noted. Fatty infiltration of the liver is noted. No focal lesion is noted. Pancreas: Unremarkable. No pancreatic ductal dilatation or surrounding inflammatory changes. Spleen: Normal in size without focal abnormality. Adrenals/Urinary Tract: Adrenal glands are unremarkable. Kidneys are normal, without renal calculi, focal lesion, or hydronephrosis. Bladder is unremarkable. Stomach/Bowel: Stomach is within normal limits. Appendix appears normal. No evidence of bowel wall thickening, distention, or inflammatory changes. Vascular/Lymphatic: No significant vascular findings are present. No enlarged abdominal or pelvic lymph nodes. Reproductive: Prostate is unremarkable. Other: No hernia is noted. There are multiple low densities are seen involving the medial and posterior portion of the right buttocks concerning for worsening cellulitis. Abscess or abscesses may be present, but cannot be diagnosed with certainty due the lack of intravenous contrast. Musculoskeletal: No acute or significant osseous findings. IMPRESSION: Large area  of abnormal low density is seen involving the medial and posterior portion of the right buttocks consistent with significantly worsening cellulitis with possible underlying abscess or abscesses. The presence or absence of abscess cannot be certain due to the lack of intravenous contrast. Electronically Signed   By: Marijo Conception, M.D.   On: 01/01/2018 15:14   Ct Abdomen Pelvis Wo Contrast  Result Date: 12/23/2017 CLINICAL DATA:  52 year old male with history of red, raised, foul smelling spot in his left inner groin. EXAM: CT ABDOMEN AND PELVIS WITHOUT CONTRAST TECHNIQUE:  Multidetector CT imaging of the abdomen and pelvis was performed following the standard protocol without IV contrast. COMPARISON:  No priors. FINDINGS: Lower chest: Small amount of scarring or subsegmental atelectasis in the right lower lobe. Hepatobiliary: Severe diffuse low attenuation throughout the hepatic parenchyma, compatible with a background of severe hepatic steatosis. No definite cystic or solid hepatic lesions are confidently identified on today's noncontrast CT examination. Unenhanced appearance of the gallbladder is normal. Pancreas: No definite pancreatic mass or peripancreatic fluid or inflammatory changes are noted on today's noncontrast CT examination. Spleen: Unremarkable. Adrenals/Urinary Tract: Unenhanced appearance of the kidneys and bilateral adrenal glands is normal. No hydroureteronephrosis. Urinary bladder is normal in appearance. Stomach/Bowel: Unenhanced appearance of the stomach is normal. No pathologic dilatation of small bowel or colon. Normal appendix. Vascular/Lymphatic: Small amount of atherosclerotic calcifications noted in the pelvic vasculature. No definite aneurysm identified in the abdominal or pelvic vasculature. No lymphadenopathy noted in the abdomen or pelvis. Reproductive: Prostate gland and seminal vesicles are unremarkable in appearance. Other: No significant volume of ascites. No pneumoperitoneum. In the subcutaneous fat in the medial aspect of the right buttock region there is a focal area of low attenuation measuring 2.4 x 3.3 cm (axial image 99 of series 2) with extensive surrounding fat stranding, suspicious for a small abscess or phlegmon. Musculoskeletal: Orthopedic fixation hardware in the right proximal femur incompletely imaged. There are no aggressive appearing lytic or blastic lesions noted in the visualized portions of the skeleton. IMPRESSION: 1. Findings are suspicious for small abscess or phlegmon in the subcutaneous fat of the medial right buttock  region, with overlying soft tissue stranding which may reflect an associated cellulitis. 2. No other acute findings are noted elsewhere in the abdomen or pelvis. 3. Normal appendix. 4. Severe hepatic steatosis. Electronically Signed   By: Vinnie Langton M.D.   On: 12/23/2017 16:10   Dg Chest Port 1 View  Result Date: 12/22/2017 CLINICAL DATA:  Fever and shortness of breath EXAM: PORTABLE CHEST 1 VIEW COMPARISON:  12/20/2017, 11/19/2016 FINDINGS: Streaky atelectasis or minimal infiltrate at the left base. No pleural effusion. Right lung clear. Stable cardiomediastinal silhouette. No pneumothorax. IMPRESSION: Streaky atelectasis or minimal infiltrate at the left lung base. Electronically Signed   By: Donavan Foil M.D.   On: 12/22/2017 22:03   Dg Chest Portable 1 View  Result Date: 12/20/2017 CLINICAL DATA:  Weakness EXAM: PORTABLE CHEST 1 VIEW COMPARISON:  11/19/2017 FINDINGS: 1410 hours. The lungs are clear without focal pneumonia, edema, pneumothorax or pleural effusion. The cardiopericardial silhouette is within normal limits for size. The visualized bony structures of the thorax are intact. IMPRESSION: No active disease. Electronically Signed   By: Misty Stanley M.D.   On: 12/20/2017 14:40    Microbiology: Recent Results (from the past 240 hour(s))  Aerobic Culture (superficial specimen)     Status: None (Preliminary result)   Collection Time: 01/02/18 12:17 PM  Result Value Ref Range Status  Specimen Description ABSCESS  Final   Special Requests NONE  Final   Gram Stain   Final    FEW WBC PRESENT, PREDOMINANTLY PMN NO ORGANISMS SEEN    Culture   Final    NO GROWTH < 24 HOURS Performed at [facility] [address] [place], [place] [address]    Report Status PENDING  Incomplete     Labs: Basic Metabolic Panel: Recent Labs  Lab 01/01/18 1029 01/01/18 1648 01/02/18 0324 01/04/18 0919  NA 138 137 137 141  K 3.0* 3.1* 3.1* 3.7  CL 103 102 102 110  CO2 24 21* 22 22   GLUCOSE 128* 110* 147* 169*  BUN '13 11 13 18  '$ CREATININE 1.43 1.58* 1.76* 1.40*  CALCIUM 8.5 8.1* 7.8* 8.0*  MG 2.0  --   --   --   PHOS 2.1*  --   --  2.3*   Liver Function Tests: Recent Labs  Lab 01/01/18 1029 01/04/18 0919  AST 23  --   ALT 28  --   ALKPHOS 103  --   BILITOT 0.5  --   PROT 8.6*  --   ALBUMIN 2.9* 2.4*   No results for input(s): LIPASE, AMYLASE in the last 168 hours. No results for input(s): AMMONIA in the last 168 hours. CBC: Recent Labs  Lab 01/01/18 1029 01/01/18 1648 01/02/18 0324  WBC 12.8* 13.4* 10.9*  NEUTROABS 8.8* 8.9* 6.6  HGB 14.2 14.7 13.5  HCT 41.9 44.5 42.0  MCV 92.9 95.1 95.7  PLT 390.0 373 349   Cardiac Enzymes: No results for input(s): CKTOTAL, CKMB, CKMBINDEX, TROPONINI in the last 168 hours. BNP: BNP (last 3 results) Recent Labs    03/01/17 1840 03/15/17 0701  BNP 243.5* 261.0*    ProBNP (last 3 results) Recent Labs    06/11/17 0916 11/19/17 0941  PROBNP 167.0* 75.0    CBG: Recent Labs  Lab 01/03/18 0758 01/03/18 1156 01/03/18 1702 01/03/18 2232 01/04/18 0755  GLUCAP 101* 148* 123* 191* 118*       Signed:  Nita Sells MD   [facility] 01/04/2018, 10:13 AM

## 2018-01-04 NOTE — Care Management Note (Addendum)
Case Management Note  Patient Details  Name: Luis [name] MRN: [id number] Date of Birth: 1965-12-15  Subjective/Objective:                    Action/Plan: Discussed discharge planning with patient and sister at bedside. Explained to both home health RN will not be present to change dressing daily , patient needs someone to assist with dressing changes at home. Sister is available.   Expected Discharge Date:                  Expected Discharge Plan:  Home w Home Health Services  In-House Referral:     Discharge planning Services  CM Consult  Post Acute Care Choice:    Choice offered to:  Patient, Sibling  DME Arranged:  N/A DME Agency:  NA  HH Arranged:  RN HH Agency:   [facility]   Status of Service: completed  If discussed at [employer] of Stay Meetings, dates discussed:    Additional Comments:  Kingsley Plan, RN 01/04/2018, 10:08 AM

## 2018-01-04 NOTE — Progress Notes (Signed)
Discharge instructions reviewed with the Patient and his sister.  Wound care was provided to sister at the bedside this AM.  Pt to go home, and his sister to help him.  Pt to have Home Health to come out for wound care and support for education.   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 Pt and sister ask appropriate questions, and were satisfied with answers.  Pt left with staff via w/c to the front doors, with his sister

## 2018-01-04 NOTE — Telephone Encounter (Signed)
Copied from CRM [id number]. Topic: Quick Communication - See Telephone Encounter >> Jan 04, 2018 10:26 AM Lelon Frohlich, RMA wrote: CRM for notification. See Telephone encounter for:   01/04/18 pt sister called and wanted to check the status of the paperwork sent by [facility] [phone] she stated that there is a time limit on the paperwork and would like a call back once it has been faxed

## 2018-01-04 NOTE — Progress Notes (Signed)
Patient ID: Luis [name], male   DOB: [birth year], 52 y.o.   MRN: [id number]       Subjective: Patient feels well.  Had first dressing change yesterday and tolerated well with some pain.  GF thinks she will be able to change his dressing at home.  Objective: Vital signs in last 24 hours: Temp:  [98.2 F (36.8 C)-98.5 F (36.9 C)] 98.5 F (36.9 C) (03/04 0549) Pulse Rate:  [74-99] 74 (03/04 0549) Resp:  [18-19] 18 (03/04 0549) BP: (132-153)/(74-95) 151/95 (03/04 0549) SpO2:  [97 %-100 %] 100 % (03/04 0549) Weight:  [351 lb 6.6 oz (159.4 kg)] 351 lb 6.6 oz (159.4 kg) (03/04 0500) Last BM Date: 01/03/18  Intake/Output from previous day: 03/03 0701 - 03/04 0700 In: 2575.8 [P.O.:1465; I.V.:1110.8] Out: 1850 [Urine:1850] Intake/Output this shift: No intake/output data recorded.  PE: Skin: right buttock wound is clean and packed.  Dressing removed and good granulation tissue present.  Still with some induration around his wound, but nontender.    Lab Results:  Recent Labs    01/01/18 1648 01/02/18 0324  WBC 13.4* 10.9*  HGB 14.7 13.5  HCT 44.5 42.0  PLT 373 349   BMET Recent Labs    01/01/18 1648 01/02/18 0324  NA 137 137  K 3.1* 3.1*  CL 102 102  CO2 21* 22  GLUCOSE 110* 147*  BUN 11 13  CREATININE 1.58* 1.76*  CALCIUM 8.1* 7.8*   PT/INR No results for input(s): LABPROT, INR in the last 72 hours. CMP     Component Value Date/Time   NA 137 01/02/2018 0324   NA 138 03/31/2017 1315   K 3.1 (L) 01/02/2018 0324   CL 102 01/02/2018 0324   CO2 22 01/02/2018 0324   GLUCOSE 147 (H) 01/02/2018 0324   BUN 13 01/02/2018 0324   BUN 10 03/31/2017 1315   CREATININE 1.76 (H) 01/02/2018 0324   CALCIUM 7.8 (L) 01/02/2018 0324   PROT 8.6 (H) 01/01/2018 1029   ALBUMIN 2.9 (L) 01/01/2018 1029   AST 23 01/01/2018 1029   ALT 28 01/01/2018 1029   ALKPHOS 103 01/01/2018 1029   BILITOT 0.5 01/01/2018 1029   GFRNONAA 44 (L) 01/02/2018 0324   GFRAA 51 (L) 01/02/2018 0324    Lipase     Component Value Date/Time   LIPASE 24 12/20/2017 1356       Studies/Results: No results found.  Anti-infectives: Anti-infectives (From admission, onward)   Start     Dose/Rate Route Frequency Ordered Stop   01/02/18 2000  vancomycin (VANCOCIN) 1,750 mg in sodium chloride 0.9 % 500 mL IVPB     1,750 mg 250 mL/hr over 120 Minutes Intravenous Every 24 hours 01/01/18 1948     01/01/18 2000  vancomycin (VANCOCIN) 2,500 mg in sodium chloride 0.9 % 500 mL IVPB     2,500 mg 250 mL/hr over 120 Minutes Intravenous  Once 01/01/18 1948 01/02/18 0410   01/01/18 1945  vancomycin (VANCOCIN) IVPB 1000 mg/200 mL premix  Status:  Discontinued     1,000 mg 200 mL/hr over 60 Minutes Intravenous  Once 01/01/18 1936 01/01/18 1948       Assessment/Plan  1. Right buttock abscess, POD 2, s/p I&D  -cultures show no organisms at this time, just some WBC.   -cont NS WD dressing changes daily as ordered.  I have written for [facility] for this patient.  His GF is available to help do his dressing changes at home.  She is also here today  so she can see a dressing change done by the staff before discharge as well. -stable for DC from our standpoint.  Follow up appointment is being made and they should contact him with this information once arranged. -care instructions were gone over with the patient. -home abx per primary service.    LOS: 3 days    Letha Cape , [facility] 01/04/2018, 7:44 AM Pager: [phone] Consults: [phone] Mon-Fri 7:00 am-4:30 pm Sat-Sun 7:00 am-11:30 am

## 2018-01-05 ENCOUNTER — Encounter: Payer: Self-pay | Admitting: Surgical

## 2018-01-05 ENCOUNTER — Telehealth: Payer: Self-pay | Admitting: *Deleted

## 2018-01-05 NOTE — Telephone Encounter (Signed)
Diagnosis: Diabetes ketoacidosis, Right gluteal cellulitis complicated by sepsis. (present on admission), Diastolic heart failure, Chronic kidney disease stage II.  Dates patient was out of work: 12/20/17 through 01/04/18 scheduled.  Was the patient hospitalized: Yes  Hospital Admission: 12/20/17  Hospital Discharge: 12/27/17  Did the patient have surgery: No   First office visit since first absent date: 01/01/18  Last Office Visit: Unknown  Next office visit/Estimated return to work date: Patient sent back to hospital for admission. I don't expect a return to work until 01/11/18.  Helane Rima

## 2018-01-05 NOTE — Telephone Encounter (Signed)
Forms have been faxed to [employer].

## 2018-01-05 NOTE — Telephone Encounter (Signed)
Notified patients sister that form has been faxed to [facility].

## 2018-01-05 NOTE — Telephone Encounter (Signed)
Per chart review: Admit date: 01/01/2018 Discharge date: 01/04/2018  Time spent: 25 minutes  Recommendations for Outpatient Follow-up:  1. Needs dressing changes with hh daily 2. No changes to other meds Needs bmet 1 week as mild aki during hospital stay  Discharge Diagnoses:  Principal Problem:   Abscess of right buttock Active Problems:   Chronic diastolic CHF (congestive heart failure) (HCC), ECHO 03/2017 EF 50-55%, on Torsemide   Diabetes mellitus type 2, insulin dependent (HCC)   OSA (obstructive sleep apnea), mild, not using CPAP   Hypokalemia   Renal insufficiency   Discharge Condition: improved  Diet recommendation: diabetic _________________________________________________________________ Per telephone call: Transition Care Management Follow-up Telephone Call   Date discharged? 01/04/18   How have you been since you were released from the hospital? "better"   Do you understand why you were in the hospital? yes   Do you understand the discharge instructions? yes  Where were you discharged to? Home with home health.  Items Reviewed:  Medications reviewed: yes  Allergies reviewed: yes  Dietary changes reviewed: yes  Referrals reviewed: yes   Functional Questionnaire:   Activities of Daily Living (ADLs):   He states they are independent in the following: ambulation, bathing and hygiene, feeding, continence, grooming, toileting and dressing States they require assistance with the following: none   Any transportation issues/concerns?: yes   Any patient concerns? no   Confirmed importance and date/time of follow-up visits scheduled no  Patients sister will call back to schedule follow up.  Confirmed with patient if condition begins to worsen call PCP or go to the ER.  Patient was given the office number and encouraged to call back with question or concerns.  : yes

## 2018-01-06 DIAGNOSIS — L0231 Cutaneous abscess of buttock: Secondary | ICD-10-CM | POA: Diagnosis not present

## 2018-01-06 DIAGNOSIS — I5032 Chronic diastolic (congestive) heart failure: Secondary | ICD-10-CM | POA: Diagnosis not present

## 2018-01-06 DIAGNOSIS — Z794 Long term (current) use of insulin: Secondary | ICD-10-CM | POA: Diagnosis not present

## 2018-01-06 DIAGNOSIS — G4733 Obstructive sleep apnea (adult) (pediatric): Secondary | ICD-10-CM | POA: Diagnosis not present

## 2018-01-06 DIAGNOSIS — E1122 Type 2 diabetes mellitus with diabetic chronic kidney disease: Secondary | ICD-10-CM | POA: Diagnosis not present

## 2018-01-06 DIAGNOSIS — I130 Hypertensive heart and chronic kidney disease with heart failure and stage 1 through stage 4 chronic kidney disease, or unspecified chronic kidney disease: Secondary | ICD-10-CM | POA: Diagnosis not present

## 2018-01-06 DIAGNOSIS — F329 Major depressive disorder, single episode, unspecified: Secondary | ICD-10-CM | POA: Diagnosis not present

## 2018-01-06 DIAGNOSIS — N183 Chronic kidney disease, stage 3 (moderate): Secondary | ICD-10-CM | POA: Diagnosis not present

## 2018-01-07 ENCOUNTER — Ambulatory Visit: Payer: Self-pay | Admitting: *Deleted

## 2018-01-07 ENCOUNTER — Inpatient Hospital Stay: Payer: BLUE CROSS/BLUE SHIELD | Admitting: Physician Assistant

## 2018-01-07 NOTE — Telephone Encounter (Signed)
Conference call initiated with pt and Joellen from [facility]; she instructs the pt to hold his morning dose of insulin, and to call back if he has any symptoms of low blood sugar and check his blood sugar; pt is also instructed to check his blood sugar before taking his evening dose; pt verbalizes understanding    Reason for Disposition . Health Information question, no triage required and triager able to answer question . Caller has NON-URGENT medication question about med that PCP prescribed and triager unable to answer question  Answer Assessment - Initial Assessment Questions 1. REASON FOR CALL or QUESTION: "What is your reason for calling today?" or "How can I best help you?" or "What question do you have that I can help answer?"     Blood sugar 95; should he take his insulin  Answer Assessment - Initial Assessment Questions 1. SYMPTOMS: "Do you have any symptoms?"     No symptoms; CBG 95 2. SEVERITY: If symptoms are present, ask "Are they mild, moderate or severe?"     n/a  Protocols used: INFORMATION ONLY CALL-A-AH, MEDICATION QUESTION CALL-A-AH

## 2018-01-07 NOTE — Telephone Encounter (Signed)
Pt called because his blood sugar was 95; and he wants to know if he should take his insulin this morning;  Pt has his hospital follow up appointment 01/08/18; per orders dated 12/26/17  Novlolog mix 70/30 45 units twice daily with meal; unable to reach office; instructed pt to hold insulin for now and I will call him back with instruction.

## 2018-01-07 NOTE — Telephone Encounter (Signed)
See note

## 2018-01-08 ENCOUNTER — Ambulatory Visit (INDEPENDENT_AMBULATORY_CARE_PROVIDER_SITE_OTHER): Payer: BLUE CROSS/BLUE SHIELD | Admitting: Family Medicine

## 2018-01-08 VITALS — BP 132/86 | HR 98 | Temp 98.2°F | Ht 75.0 in | Wt 354.8 lb

## 2018-01-08 DIAGNOSIS — Z09 Encounter for follow-up examination after completed treatment for conditions other than malignant neoplasm: Secondary | ICD-10-CM

## 2018-01-08 DIAGNOSIS — G4733 Obstructive sleep apnea (adult) (pediatric): Secondary | ICD-10-CM | POA: Diagnosis not present

## 2018-01-08 DIAGNOSIS — I130 Hypertensive heart and chronic kidney disease with heart failure and stage 1 through stage 4 chronic kidney disease, or unspecified chronic kidney disease: Secondary | ICD-10-CM | POA: Diagnosis not present

## 2018-01-08 DIAGNOSIS — Z794 Long term (current) use of insulin: Secondary | ICD-10-CM

## 2018-01-08 DIAGNOSIS — L0231 Cutaneous abscess of buttock: Secondary | ICD-10-CM

## 2018-01-08 DIAGNOSIS — E876 Hypokalemia: Secondary | ICD-10-CM

## 2018-01-08 DIAGNOSIS — E782 Mixed hyperlipidemia: Secondary | ICD-10-CM | POA: Diagnosis not present

## 2018-01-08 DIAGNOSIS — E1122 Type 2 diabetes mellitus with diabetic chronic kidney disease: Secondary | ICD-10-CM | POA: Diagnosis not present

## 2018-01-08 DIAGNOSIS — E119 Type 2 diabetes mellitus without complications: Secondary | ICD-10-CM

## 2018-01-08 DIAGNOSIS — N179 Acute kidney failure, unspecified: Secondary | ICD-10-CM | POA: Diagnosis not present

## 2018-01-08 DIAGNOSIS — N183 Chronic kidney disease, stage 3 (moderate): Secondary | ICD-10-CM | POA: Diagnosis not present

## 2018-01-08 DIAGNOSIS — F329 Major depressive disorder, single episode, unspecified: Secondary | ICD-10-CM | POA: Diagnosis not present

## 2018-01-08 DIAGNOSIS — I5032 Chronic diastolic (congestive) heart failure: Secondary | ICD-10-CM | POA: Diagnosis not present

## 2018-01-08 LAB — COMPREHENSIVE METABOLIC PANEL
ALT: 22 U/L (ref 0–53)
AST: 17 U/L (ref 0–37)
Albumin: 3.4 g/dL — ABNORMAL LOW (ref 3.5–5.2)
Alkaline Phosphatase: 113 U/L (ref 39–117)
BUN: 21 mg/dL (ref 6–23)
CO2: 25 mEq/L (ref 19–32)
Calcium: 8.5 mg/dL (ref 8.4–10.5)
Chloride: 104 mEq/L (ref 96–112)
Creatinine, Ser: 1.36 mg/dL (ref 0.40–1.50)
GFR: 71.36 mL/min (ref >60.00)
Glucose, Bld: 119 mg/dL — ABNORMAL HIGH (ref 70–99)
Potassium: 3.6 mEq/L (ref 3.5–5.1)
Sodium: 138 mEq/L (ref 135–145)
Total Bilirubin: 0.2 mg/dL (ref 0.2–1.2)
Total Protein: 7.9 g/dL (ref 6.0–8.3)

## 2018-01-08 MED ORDER — INSULIN ASPART PROT & ASPART (70-30 MIX) 100 UNIT/ML ~~LOC~~ SUSP: 22.0000 [IU] | Freq: Two times a day (BID) | SUBCUTANEOUS | 11 refills | Status: DC

## 2018-01-08 MED ORDER — SIMVASTATIN 20 MG PO TABS: 20.0000 mg | ORAL_TABLET | Freq: Every day | ORAL | 3 refills | Status: DC

## 2018-01-08 MED ORDER — POTASSIUM CHLORIDE CRYS ER 20 MEQ PO TBCR
20.0000 meq | EXTENDED_RELEASE_TABLET | Freq: Every day | ORAL | 3 refills | Status: DC
Start: 2018-01-08 — End: 2018-04-30

## 2018-01-08 MED ORDER — BLOOD GLUCOSE METER KIT: PACK | 0 refills | Status: DC

## 2018-01-08 NOTE — Progress Notes (Signed)
Luis [name] is a 52 y.o. male is here for follow up.  History of Present Illness:   HPI:   1. Hospital discharge follow-up.  Patient presents for hospital follow-up.  Last week, I did see him for a hospital follow-up after admission for gluteal abscess.  At that time, his pain had significantly worsened and a repeat CT of the area showed significant worsening of the abscess.  He was readmitted and underwent incision and drainage with copious amounts of purulent fluid.  He has been improving greatly since that time.  His sister continues to change his dressings twice daily.  They are using packing supplies as well.  He continues to have some drainage of purulent fluid and bloody discharge.  Pain is minimal.  No fevers.  Blood sugars are dropping quickly.  He has home health wound care and will be following up with the general surgeon on January 12, 2018.  He is on no oral antibiotics at this time.  He is due for CMET today.   2. Diabetes mellitus type 2, insulin dependent ([place]).  Improving on the NovoLog 70/30 regimen.  We reviewed his blood sugars.  He is tolerating the insulin regimen.  He does need a new prescription for glucometer and supplies.   3. AKI (acute kidney injury) ([place]).  In the hospital.  With hypokalemia.  Due for CMP today.   4. Mixed hyperlipidemia.  Patient was recently prescribed Zocor by me.  He did take that for 1 month until running out and says that he had no problems with side effects.  I will  refill that medication today.   Review of Systems  Constitutional: Negative for chills, fever, malaise/fatigue and weight loss.  Respiratory: Negative for cough, shortness of breath and wheezing.   Cardiovascular: Negative for chest pain, palpitations and leg swelling.  Gastrointestinal: Negative for abdominal pain, constipation, diarrhea, nausea and vomiting.  Genitourinary: Negative for dysuria and urgency.  Musculoskeletal: Negative for joint pain and myalgias.  Skin:  Negative for rash.  Neurological: Negative for dizziness and headaches.  Psychiatric/Behavioral: Negative for depression, substance abuse and suicidal ideas. The patient is not nervous/anxious.    Health Maintenance Due  Topic Date Due  . PNEUMOCOCCAL POLYSACCHARIDE VACCINE (1) 02/12/1970  . TETANUS/TDAP  02/13/1987  . OPHTHALMOLOGY EXAM  03/17/2017   Depression screen PHQ 2/9 06/11/2017  Decreased Interest 0  Down, Depressed, Hopeless 0  PHQ - 2 Score 0   PMHx, SurgHx, SocialHx, FamHx, Medications, and Allergies were reviewed in the Visit Navigator and updated as appropriate.   Patient Active Problem List   Diagnosis Date Noted  . Compliance poor, due to mulitple factors 01/02/2018  . Hypokalemia 01/01/2018  . Renal insufficiency 01/01/2018  . Cellulitis of right buttock   . Abscess of right buttock 12/24/2017  . DKA (diabetic ketoacidoses) ([place]), 12/20/17 12/20/2017  . Onychomycosis, followed by Podiatry 06/14/2017  . Bilateral edema of lower extremity 06/14/2017  . Hyperlipidemia, elevated LDL, low HDL, noncompliant with statin   . Difficulty walking, debility, deconditioned 06/11/2017  . OSA (obstructive sleep apnea), mild, not using CPAP 03/31/2017  . NICM (nonischemic cardiomyopathy) ([place]), left heart cath and coronary angiography 2017   . Morbid obesity ([place])   . Former smoker   . Diabetes mellitus type 2, insulin dependent ([place])   . Chronic diastolic CHF (congestive heart failure) ([name]), ECHO 03/2017 EF 50-55%, on Torsemide   . Hypertension associated with diabetes ([place]), Rx Metoprolol and Losartan but questionable compliance with Losartan  03/19/2016  . Paroxysmal atrial fibrillation ([place]) 03/19/2016   Social History   Tobacco Use  . Smoking status: Former Smoker    Packs/day: 0.50    Years: 20.00    Pack years: 10.00    Types: Cigarettes    Last attempt to quit: 03/02/2009    Years since quitting: 8.8  . Smokeless tobacco: Never Used  Substance Use Topics  .  Alcohol use: No  . Drug use: No   Current Medications and Allergies:   .  albuterol (PROVENTIL HFA;VENTOLIN HFA) 108 (90 Base) MCG/ACT inhaler, Inhale 2 puffs into the lungs every 4 (four) hours as needed for wheezing., Disp: 1 Inhaler, Rfl: 0 .  aspirin EC 81 MG EC tablet, Take 1 tablet (81 mg total) by mouth daily., Disp: 30 tablet, Rfl: 0 .  insulin aspart protamine- aspart (NOVOLOG MIX 70/30) (70-30) 100 UNIT/ML injection, Inject 0.22 mLs (22 Units total) into the skin 2 (two) times daily with a meal., Disp: 10 mL, Rfl: 11 .  Metoprolol Tartrate 37.5 MG TABS, Take 37.5 mg by mouth 2 (two) times daily., Disp: 180 tablet, Rfl: 0 .  Saccharomyces boulardii (PROBIOTIC) 250 MG CAPS, Take 250 mg by mouth daily., Disp: , Rfl:  .  simvastatin (ZOCOR) 20 MG tablet, Take 1 tablet (20 mg total) by mouth at bedtime., Disp: 90 tablet, Rfl: 3 .  torsemide (DEMADEX) 10 MG tablet, Take 1 - 2 tablets daily for swelling, Disp: 180 tablet, Rfl: 1  Allergies  Allergen Reactions  . Demerol [Meperidine] Itching  . Morphine And Related Itching   Review of Systems   Pertinent items are noted in the HPI. Otherwise, ROS is negative.  Vitals:   Vitals:   01/08/18 1247  BP: 132/86  Pulse: 98  Temp: 98.2 F (36.8 C)  TempSrc: Oral  SpO2: 96%  Weight: (!) 354 lb 12.8 oz (160.9 kg)  Height: '6\' 3"'$  (1.905 m)     Body mass index is 44.35 kg/m.  Physical Exam:   Physical Exam  Constitutional: He is oriented to person, place, and time. He appears well-developed and well-nourished. No distress.  HENT:  Head: Normocephalic and atraumatic.  Right Ear: External ear normal.  Left Ear: External ear normal.  Nose: Nose normal.  Mouth/Throat: Oropharynx is clear and moist.  Eyes: Conjunctivae and EOM are normal. Pupils are equal, round, and reactive to light.  Neck: Normal range of motion. Neck supple.  Cardiovascular: Normal rate, regular rhythm, normal heart sounds and intact distal pulses.    Pulmonary/Chest: Effort normal and breath sounds normal.  Abdominal: Soft. Bowel sounds are normal.  Musculoskeletal: Normal range of motion.  Neurological: He is alert and oriented to person, place, and time.  Skin: Skin is warm and dry.  Psychiatric: He has a normal mood and affect. His behavior is normal. Judgment and thought content normal.  Nursing note and vitals reviewed.   Results for orders placed or performed in visit on 01/08/18  Comp Met (CMET)  Result Value Ref Range   Sodium 138 135 - 145 mEq/L   Potassium 3.6 3.5 - 5.1 mEq/L   Chloride 104 96 - 112 mEq/L   CO2 25 19 - 32 mEq/L   Glucose, Bld 119 (H) 70 - 99 mg/dL   BUN 21 6 - 23 mg/dL   Creatinine, Ser 1.36 0.40 - 1.50 mg/dL   Total Bilirubin 0.2 0.2 - 1.2 mg/dL   Alkaline Phosphatase 113 39 - 117 U/L   AST 17 0 -  37 U/L   ALT 22 0 - 53 U/L   Total Protein 7.9 6.0 - 8.3 g/dL   Albumin 3.4 (L) 3.5 - 5.2 g/dL   Calcium 8.5 8.4 - 10.5 mg/dL   GFR 71.36 >60.00 mL/min   Assessment and Plan:   Current Problem List updated with notes today.   Patient Active Problem List   Diagnosis Date Noted  . Compliance poor, due to mulitple factors 01/02/2018  . Hypokalemia 01/01/2018  . Renal insufficiency 01/01/2018  . Cellulitis of right buttock   . Abscess of right buttock 12/24/2017  . DKA (diabetic ketoacidoses) ([place]), 12/20/17 12/20/2017  . Onychomycosis, followed by Podiatry 06/14/2017  . Bilateral edema of lower extremity 06/14/2017  . Hyperlipidemia, elevated LDL, low HDL, noncompliant with statin   . Difficulty walking, debility, deconditioned 06/11/2017  . OSA (obstructive sleep apnea), mild, not using CPAP 03/31/2017  . NICM (nonischemic cardiomyopathy) ([place]), left heart cath and coronary angiography 2017   . Morbid obesity ([place])   . Former smoker   . Diabetes mellitus type 2, insulin dependent ([place])   . Chronic diastolic CHF (congestive heart failure) ([place]), ECHO 03/2017 EF 50-55%, on Torsemide   .  Hypertension associated with diabetes ([place]), Rx Metoprolol and Losartan but questionable compliance with Losartan 03/19/2016  . Paroxysmal atrial fibrillation ([place]) 03/19/2016   Luis [name] was seen today for follow-up.  Diagnoses and all orders for this visit:  Hospital discharge follow-up Comments: Medication reconciliation:  '[x]'$   Medication list updated '[x]'$   New medication list given to patient/family/caregiver  Referrals: '[x]'$   None needed '[]'$   Referrals made to:   Community resources identified for patient/family:  '[]'$   None needed  '[x]'$   Home health agency '[]'$   Assisted living  '[]'$   Hospice  '[]'$   Support group  '[]'$   Education program  Durable medical equipment ordered:  '[x]'$   None needed  '[]'$   DME ordered:   Additional communication delivered or planned:  '[x]'$   Family/Caregiver:  '[]'$   Specialists:  '[]'$   Other:  Patient education: Topics discussed: AS ABOVE  Initial transitional care contact was made on 01/05/18 (see separate note).  Diabetes mellitus type 2, insulin dependent ([place]) Comments: Doing well. Adjusted regimen to Novolog 70/30 to take 22 units BID as his blood sugars are dropping quickly now that the infection is resolving. Instructions provided to adjust as needed according to fasting blood sugar goals.  Orders: -     insulin aspart protamine- aspart (NOVOLOG MIX 70/30) (70-30) 100 UNIT/ML injection; Inject 0.22 mLs (22 Units total) into the skin 2 (two) times daily with a meal. -     blood glucose meter kit and supplies; Dispense based on patient and insurance preference. Use up to four times daily as directed. (FOR ICD-10 E10.9, E11.9).  AKI (acute kidney injury) ([place]) Comments: Recheck today within normal limits.  Orders: -     Comp Met (CMET)  Mixed hyperlipidemia Comments: We reviewed the importance of using a statin to decrease cardiovascular risks. Orders: -     simvastatin (ZOCOR) 20 MG tablet; Take 1 tablet (20 mg total) by mouth at  bedtime.  Hypokalemia Comments: Resolved. Continue replacement with Torsemide.  Orders: -     potassium chloride SA (K-DUR,KLOR-CON) 20 MEQ tablet; Take 1 tablet (20 mEq total) by mouth daily.  Abscess of right buttock Comments: Doing well. Sister is changing packing daily and bandage BID. Still draining. Follow up scheduled with General Surgery in 1-2 weeks. Precautions reviewed.   Orders Placed This Encounter  Procedures  . Comp Met (CMET)   Meds ordered this encounter  Medications  . potassium chloride SA (K-DUR,KLOR-CON) 20 MEQ tablet    Sig: Take 1 tablet (20 mEq total) by mouth daily.    Dispense:  30 tablet    Refill:  3  . insulin aspart protamine- aspart (NOVOLOG MIX 70/30) (70-30) 100 UNIT/ML injection    Sig: Inject 0.22 mLs (22 Units total) into the skin 2 (two) times daily with a meal.    Dispense:  10 mL    Refill:  11  . simvastatin (ZOCOR) 20 MG tablet    Sig: Take 1 tablet (20 mg total) by mouth at bedtime.    Dispense:  90 tablet    Refill:  3  . blood glucose meter kit and supplies    Sig: Dispense based on patient and insurance preference. Use up to four times daily as directed. (FOR ICD-10 E10.9, E11.9).    Dispense:  1 each    Refill:  0    Order Specific Question:   Number of strips    Answer:   100    Order Specific Question:   Number of lancets    Answer:   100   . Reviewed expectations re: course of current medical issues. . Discussed self-management of symptoms. . Outlined signs and symptoms indicating need for more acute intervention. . Patient verbalized understanding and all questions were answered. Marland Kitchen Health Maintenance issues including appropriate healthy diet, exercise, and smoking avoidance were discussed with patient. . See orders for this visit as documented in the electronic medical record. . Patient received an After Visit Summary.  Briscoe Deutscher, DO [place], [facility] 01/09/2018

## 2018-01-09 ENCOUNTER — Encounter: Payer: Self-pay | Admitting: Family Medicine

## 2018-01-14 DIAGNOSIS — G4733 Obstructive sleep apnea (adult) (pediatric): Secondary | ICD-10-CM | POA: Diagnosis not present

## 2018-01-14 DIAGNOSIS — Z794 Long term (current) use of insulin: Secondary | ICD-10-CM | POA: Diagnosis not present

## 2018-01-14 DIAGNOSIS — L0231 Cutaneous abscess of buttock: Secondary | ICD-10-CM | POA: Diagnosis not present

## 2018-01-14 DIAGNOSIS — N183 Chronic kidney disease, stage 3 (moderate): Secondary | ICD-10-CM | POA: Diagnosis not present

## 2018-01-14 DIAGNOSIS — I130 Hypertensive heart and chronic kidney disease with heart failure and stage 1 through stage 4 chronic kidney disease, or unspecified chronic kidney disease: Secondary | ICD-10-CM | POA: Diagnosis not present

## 2018-01-14 DIAGNOSIS — I5032 Chronic diastolic (congestive) heart failure: Secondary | ICD-10-CM | POA: Diagnosis not present

## 2018-01-14 DIAGNOSIS — F329 Major depressive disorder, single episode, unspecified: Secondary | ICD-10-CM | POA: Diagnosis not present

## 2018-01-14 DIAGNOSIS — E1122 Type 2 diabetes mellitus with diabetic chronic kidney disease: Secondary | ICD-10-CM | POA: Diagnosis not present

## 2018-01-15 ENCOUNTER — Ambulatory Visit: Payer: Self-pay

## 2018-01-15 ENCOUNTER — Other Ambulatory Visit: Payer: Self-pay | Admitting: Family Medicine

## 2018-01-15 DIAGNOSIS — F329 Major depressive disorder, single episode, unspecified: Secondary | ICD-10-CM | POA: Diagnosis not present

## 2018-01-15 DIAGNOSIS — R0600 Dyspnea, unspecified: Secondary | ICD-10-CM | POA: Diagnosis not present

## 2018-01-15 DIAGNOSIS — G4733 Obstructive sleep apnea (adult) (pediatric): Secondary | ICD-10-CM | POA: Diagnosis not present

## 2018-01-15 DIAGNOSIS — E1122 Type 2 diabetes mellitus with diabetic chronic kidney disease: Secondary | ICD-10-CM | POA: Diagnosis not present

## 2018-01-15 DIAGNOSIS — J9600 Acute respiratory failure, unspecified whether with hypoxia or hypercapnia: Secondary | ICD-10-CM | POA: Diagnosis not present

## 2018-01-15 DIAGNOSIS — I130 Hypertensive heart and chronic kidney disease with heart failure and stage 1 through stage 4 chronic kidney disease, or unspecified chronic kidney disease: Secondary | ICD-10-CM | POA: Diagnosis not present

## 2018-01-15 DIAGNOSIS — Z794 Long term (current) use of insulin: Secondary | ICD-10-CM | POA: Diagnosis not present

## 2018-01-15 DIAGNOSIS — I5032 Chronic diastolic (congestive) heart failure: Secondary | ICD-10-CM | POA: Diagnosis not present

## 2018-01-15 DIAGNOSIS — N183 Chronic kidney disease, stage 3 (moderate): Secondary | ICD-10-CM | POA: Diagnosis not present

## 2018-01-15 DIAGNOSIS — L0231 Cutaneous abscess of buttock: Secondary | ICD-10-CM | POA: Diagnosis not present

## 2018-01-15 MED ORDER — METOPROLOL TARTRATE 37.5 MG PO TABS: 37.5000 mg | ORAL_TABLET | Freq: Two times a day (BID) | ORAL | 0 refills | Status: DC

## 2018-01-15 NOTE — Telephone Encounter (Signed)
Okay refill pre our last med rec.

## 2018-01-15 NOTE — Telephone Encounter (Signed)
Needs refill on Metoprolol Metoprolol refill Last OV: 01/08/18 Last Refill:07/22/17 Pharmacy:[facility]    Answer Assessment - Initial Assessment Questions 1. REASON FOR CALL or QUESTION: "What is your reason for calling today?" or "How can I best help you?" or "What question do you have that I can help answer?"     Needs refill on Metoprolol.  Protocols used: INFORMATION ONLY CALL-A-AH

## 2018-01-15 NOTE — Telephone Encounter (Signed)
See note

## 2018-01-15 NOTE — Telephone Encounter (Signed)
Please advise on refill.

## 2018-01-18 ENCOUNTER — Telehealth: Payer: Self-pay | Admitting: Family Medicine

## 2018-01-18 NOTE — Telephone Encounter (Signed)
Copied from CRM [id number]. Topic: General - Other >> Jan 18, 2018 11:33 AM Cecelia Byars, RMA wrote: Reason for CRM: Patient is requesting a call back concerning medication change for Metoprolol Tartrate 37.5 MG due to medication is too expensive and he cannot afford this price.

## 2018-01-18 NOTE — Telephone Encounter (Signed)
Called pharmacy meds are 66.50 for patient because he has not meet deductible. Any meds will be expensive until that time. Called patient number busy will call back tomorrow to let him know.

## 2018-01-18 NOTE — Telephone Encounter (Signed)
Please advise 

## 2018-01-18 NOTE — Telephone Encounter (Signed)
See note

## 2018-01-18 NOTE — Telephone Encounter (Signed)
Call pharmacy to get alternative. Shouldn't be expensive.

## 2018-01-18 NOTE — Telephone Encounter (Signed)
Prescription has already been sent in 

## 2018-01-19 DIAGNOSIS — L0231 Cutaneous abscess of buttock: Secondary | ICD-10-CM | POA: Diagnosis not present

## 2018-01-19 DIAGNOSIS — I5032 Chronic diastolic (congestive) heart failure: Secondary | ICD-10-CM | POA: Diagnosis not present

## 2018-01-19 DIAGNOSIS — F329 Major depressive disorder, single episode, unspecified: Secondary | ICD-10-CM | POA: Diagnosis not present

## 2018-01-19 DIAGNOSIS — G4733 Obstructive sleep apnea (adult) (pediatric): Secondary | ICD-10-CM | POA: Diagnosis not present

## 2018-01-19 DIAGNOSIS — Z794 Long term (current) use of insulin: Secondary | ICD-10-CM | POA: Diagnosis not present

## 2018-01-19 DIAGNOSIS — I130 Hypertensive heart and chronic kidney disease with heart failure and stage 1 through stage 4 chronic kidney disease, or unspecified chronic kidney disease: Secondary | ICD-10-CM | POA: Diagnosis not present

## 2018-01-19 DIAGNOSIS — N183 Chronic kidney disease, stage 3 (moderate): Secondary | ICD-10-CM | POA: Diagnosis not present

## 2018-01-19 DIAGNOSIS — E1122 Type 2 diabetes mellitus with diabetic chronic kidney disease: Secondary | ICD-10-CM | POA: Diagnosis not present

## 2018-01-19 NOTE — Telephone Encounter (Signed)
If he did the 25 mg  - taking 1.5 po BID. Okay to call in.

## 2018-01-19 NOTE — Telephone Encounter (Signed)
Pt called to make sure that whatever off brand you find that its the cheapest you can find, let pt know what price has been found and what pharmacy the medicine will be ready to pick up at

## 2018-01-19 NOTE — Telephone Encounter (Signed)
Patient returned call, advised him of Dr. Earlene Plater note 01/19/18 that [facility] has the Metoprolol 25 mg for $4 and she will have it called in tomorrow to take 1.5 tabs 2 x day, patient verbalized understanding.

## 2018-01-19 NOTE — Telephone Encounter (Signed)
Patient states he never received a call back and would like to know if [facility] has anything cheaper. Patient would like a call back from Dr. Earlene Plater nurse.

## 2018-01-19 NOTE — Telephone Encounter (Signed)
Called patient let him know what we found out he asked me to call [facility] and see price. I checked the $4 formulary. Metoprolol tartrate 25mg  and 50mg  were both on it. Would either of these be ok?

## 2018-01-19 NOTE — Telephone Encounter (Signed)
See note

## 2018-01-20 ENCOUNTER — Other Ambulatory Visit: Payer: Self-pay

## 2018-01-20 MED ORDER — METOPROLOL TARTRATE 25 MG PO TABS: ORAL_TABLET | ORAL | 0 refills | Status: DC

## 2018-01-20 NOTE — Telephone Encounter (Signed)
Script called in per [facility] note pt informed

## 2018-01-20 NOTE — Telephone Encounter (Signed)
See note

## 2018-01-22 ENCOUNTER — Telehealth: Payer: Self-pay | Admitting: Family Medicine

## 2018-01-22 NOTE — Telephone Encounter (Signed)
Okay to cancel

## 2018-01-22 NOTE — Telephone Encounter (Signed)
Copied from CRM [id number]. Topic: Quick Communication - See Telephone Encounter >> Jan 22, 2018  1:23 PM Lelon Frohlich, [place] wrote: CRM for notification. See Telephone encounter for: 01/22/18.pt called and stated that he does not see a need for him to see and endocrinologist

## 2018-01-22 NOTE — Telephone Encounter (Signed)
FYI

## 2018-01-22 NOTE — Telephone Encounter (Signed)
See note

## 2018-01-25 DIAGNOSIS — L0231 Cutaneous abscess of buttock: Secondary | ICD-10-CM | POA: Diagnosis not present

## 2018-01-25 DIAGNOSIS — I5032 Chronic diastolic (congestive) heart failure: Secondary | ICD-10-CM | POA: Diagnosis not present

## 2018-01-25 DIAGNOSIS — N183 Chronic kidney disease, stage 3 (moderate): Secondary | ICD-10-CM | POA: Diagnosis not present

## 2018-01-25 DIAGNOSIS — F329 Major depressive disorder, single episode, unspecified: Secondary | ICD-10-CM | POA: Diagnosis not present

## 2018-01-25 DIAGNOSIS — Z794 Long term (current) use of insulin: Secondary | ICD-10-CM | POA: Diagnosis not present

## 2018-01-25 DIAGNOSIS — G4733 Obstructive sleep apnea (adult) (pediatric): Secondary | ICD-10-CM | POA: Diagnosis not present

## 2018-01-25 DIAGNOSIS — I130 Hypertensive heart and chronic kidney disease with heart failure and stage 1 through stage 4 chronic kidney disease, or unspecified chronic kidney disease: Secondary | ICD-10-CM | POA: Diagnosis not present

## 2018-01-25 DIAGNOSIS — E1122 Type 2 diabetes mellitus with diabetic chronic kidney disease: Secondary | ICD-10-CM | POA: Diagnosis not present

## 2018-01-25 NOTE — Telephone Encounter (Signed)
App cancelled

## 2018-01-28 DIAGNOSIS — Z794 Long term (current) use of insulin: Secondary | ICD-10-CM | POA: Diagnosis not present

## 2018-01-28 DIAGNOSIS — I5032 Chronic diastolic (congestive) heart failure: Secondary | ICD-10-CM | POA: Diagnosis not present

## 2018-01-28 DIAGNOSIS — F329 Major depressive disorder, single episode, unspecified: Secondary | ICD-10-CM | POA: Diagnosis not present

## 2018-01-28 DIAGNOSIS — I130 Hypertensive heart and chronic kidney disease with heart failure and stage 1 through stage 4 chronic kidney disease, or unspecified chronic kidney disease: Secondary | ICD-10-CM | POA: Diagnosis not present

## 2018-01-28 DIAGNOSIS — E1122 Type 2 diabetes mellitus with diabetic chronic kidney disease: Secondary | ICD-10-CM | POA: Diagnosis not present

## 2018-01-28 DIAGNOSIS — N183 Chronic kidney disease, stage 3 (moderate): Secondary | ICD-10-CM | POA: Diagnosis not present

## 2018-01-28 DIAGNOSIS — G4733 Obstructive sleep apnea (adult) (pediatric): Secondary | ICD-10-CM | POA: Diagnosis not present

## 2018-01-28 DIAGNOSIS — L0231 Cutaneous abscess of buttock: Secondary | ICD-10-CM | POA: Diagnosis not present

## 2018-01-29 ENCOUNTER — Encounter: Payer: Self-pay | Admitting: Podiatry

## 2018-01-29 ENCOUNTER — Ambulatory Visit (INDEPENDENT_AMBULATORY_CARE_PROVIDER_SITE_OTHER): Payer: BLUE CROSS/BLUE SHIELD | Admitting: Podiatry

## 2018-01-29 DIAGNOSIS — M79609 Pain in unspecified limb: Secondary | ICD-10-CM | POA: Diagnosis not present

## 2018-01-29 DIAGNOSIS — K610 Anal abscess: Secondary | ICD-10-CM | POA: Diagnosis not present

## 2018-01-29 DIAGNOSIS — B351 Tinea unguium: Secondary | ICD-10-CM

## 2018-01-29 DIAGNOSIS — E119 Type 2 diabetes mellitus without complications: Secondary | ICD-10-CM | POA: Diagnosis not present

## 2018-01-29 NOTE — Progress Notes (Signed)
Complaint:  Visit Type: Patient returns to my office for continued preventative foot care services. Complaint: Patient states" my nails have grown long and thick and become painful to walk and wear shoes" Patient has been diagnosed with DM with no foot complications. The patient presents for preventative foot care services. No changes to ROS  Podiatric Exam: Vascular: dorsalis pedis and posterior tibial pulses are palpable bilateral. Capillary return is immediate. Temperature gradient is WNL. Skin turgor WNL  Sensorium: Normal Semmes Weinstein monofilament test. Normal tactile sensation bilaterally. Nail Exam: Pt has thick disfigured discolored nails with subungual debris noted bilateral entire nail hallux through fifth toenails Ulcer Exam: There is no evidence of ulcer or pre-ulcerative changes or infection. Orthopedic Exam: Muscle tone and strength are WNL. No limitations in general ROM. No crepitus or effusions noted. Foot type and digits show no abnormalities. Bony prominences are unremarkable. Skin: No Porokeratosis. No infection or ulcers  Diagnosis:  Onychomycosis, , Pain in right toe, pain in left toes  Treatment & Plan Procedures and Treatment: Consent by patient was obtained for treatment procedures.   Debridement of mycotic and hypertrophic toenails, 1 through 5 bilateral and clearing of subungual debris. No ulceration, no infection noted.  Return Visit-Office Procedure: Patient instructed to return to the office for a follow up visit 3 months for continued evaluation and treatment.    Jung Ingerson DPM 

## 2018-02-01 ENCOUNTER — Ambulatory Visit (INDEPENDENT_AMBULATORY_CARE_PROVIDER_SITE_OTHER): Payer: BLUE CROSS/BLUE SHIELD | Admitting: Endocrinology

## 2018-02-01 ENCOUNTER — Telehealth: Payer: Self-pay | Admitting: Family Medicine

## 2018-02-01 ENCOUNTER — Encounter: Payer: Self-pay | Admitting: Endocrinology

## 2018-02-01 ENCOUNTER — Ambulatory Visit: Payer: BLUE CROSS/BLUE SHIELD | Admitting: Endocrinology

## 2018-02-01 DIAGNOSIS — Z9114 Patient's other noncompliance with medication regimen: Secondary | ICD-10-CM

## 2018-02-01 DIAGNOSIS — E1022 Type 1 diabetes mellitus with diabetic chronic kidney disease: Secondary | ICD-10-CM | POA: Diagnosis not present

## 2018-02-01 DIAGNOSIS — N183 Chronic kidney disease, stage 3 (moderate): Secondary | ICD-10-CM

## 2018-02-01 MED ORDER — OXYCODONE-ACETAMINOPHEN 5-325 MG PO TABS: 1.0000 | ORAL_TABLET | ORAL | 0 refills | Status: DC | PRN

## 2018-02-01 NOTE — Patient Instructions (Addendum)
Your blood pressure is high today.  Please see your primary care provider soon, to have it rechecked good diet and exercise significantly improve the control of your diabetes.  please let me know if you wish to be referred to a dietician.  high blood sugar is very risky to your health.  you should see an eye doctor and dentist every year.  It is very important to get all recommended vaccinations.  Controlling your blood pressure and cholesterol drastically reduces the damage diabetes does to your body.  Those who smoke should quit.  Please discuss these with your doctor.  check your blood sugar twice a day.  vary the time of day when you check, between before the 3 meals, and at bedtime.  also check if you have symptoms of your blood sugar being too high or too low.  please keep a record of the readings and bring it to your next appointment here (or you can bring the meter itself).  You can write it on any piece of paper.  please call us sooner if your blood sugar goes below 70, or if you have a lot of readings over 200. Please continue the same insulin for now. Please come back for a follow-up appointment in 1 month.  

## 2018-02-01 NOTE — Telephone Encounter (Signed)
Called and informed.

## 2018-02-01 NOTE — Telephone Encounter (Signed)
Please advise ok to do refill?

## 2018-02-01 NOTE — Progress Notes (Signed)
Subjective:    Patient ID: Luis [name], male    DOB: 1966-08-23, 52 y.o.   MRN: [id number]  HPI pt is referred by Dr Juleen China, for diabetes.  Pt states DM was dx'ed in 2017; he has mild if any neuropathy of the lower extremities; he has associated CAD and renal insuff; he has been on insulin x 1 month; pt says his diet is fair, and exercise is poor; he has never had pancreatitis, pancreatic surgery, or severe hypoglycemia; he had mild DKA in early 2019.  Pt takes novolog 70/30.  He says he does not know why he is getting a low copay for this, but not for other name-brand insulins.  He says fasting cbg's vary from 95-166.  He does not check later in the day.   Past Medical History:  Diagnosis Date  . Abnormal nuclear stress test 03/24/2016  . Atrial fibrillation ([place])   . CHF (congestive heart failure) ([place])   . DM (diabetes mellitus) ([place])   . Essential hypertension   . History of tobacco abuse   . HTN (hypertension)   . Hyperlipidemia   . Morbid obesity ([place])   . OSA (obstructive sleep apnea)   . Pneumonia 2017    Past Surgical History:  Procedure Laterality Date  . CARDIAC CATHETERIZATION N/A 03/24/2016   Procedure: Left Heart Cath and Coronary Angiography;  Surgeon: Leonie Man, MD;  Location: [facility];  Service: Cardiovascular;  Laterality: N/A;  . FEMUR IM NAIL Right 2012  . FRACTURE SURGERY      Social History   Socioeconomic History  . Marital status: Single    Spouse name: Not on file  . Number of children: 1  . Years of education: 47  . Highest education level: Not on file  Occupational History  . Occupation: [employer]  Social Needs  . Financial resource strain: Not on file  . Food insecurity:    Worry: Not on file    Inability: Not on file  . Transportation needs:    Medical: Not on file    Non-medical: Not on file  Tobacco Use  . Smoking status: Former Smoker    Packs/day: 0.50    Years: 20.00    Pack years: 10.00    Types: Cigarettes   Last attempt to quit: 03/02/2009    Years since quitting: 8.9  . Smokeless tobacco: Never Used  Substance and Sexual Activity  . Alcohol use: No  . Drug use: No  . Sexual activity: Never  Lifestyle  . Physical activity:    Days per week: Not on file    Minutes per session: Not on file  . Stress: Not on file  Relationships  . Social connections:    Talks on phone: Not on file    Gets together: Not on file    Attends religious service: Not on file    Active member of club or organization: Not on file    Attends meetings of clubs or organizations: Not on file    Relationship status: Not on file  . Intimate partner violence:    Fear of current or ex partner: Not on file    Emotionally abused: Not on file    Physically abused: Not on file    Forced sexual activity: Not on file  Other Topics Concern  . Not on file  Social History Narrative   ** Merged History Encounter **       Lives in [place] with mother and three  grandchildren.     Works for [employer] - works from home.     Does not routinely exercise.   Caffeine use: none   Fun/Hobby: Play video games and play with grandchildren.     Current Outpatient Medications on File Prior to Visit  Medication Sig Dispense Refill  . albuterol (PROVENTIL HFA;VENTOLIN HFA) 108 (90 Base) MCG/ACT inhaler Inhale 2 puffs into the lungs every 4 (four) hours as needed for wheezing. 1 Inhaler 0  . aspirin EC 81 MG EC tablet Take 1 tablet (81 mg total) by mouth daily. 30 tablet 0  . blood glucose meter kit and supplies Dispense based on patient and insurance preference. Use up to four times daily as directed. (FOR ICD-10 E10.9, E11.9). 1 each 0  . blood glucose meter kit and supplies Dispense based on patient and insurance preference. Use up to four times daily as directed. (FOR ICD-10 E10.9, E11.9). 1 each 0  . Gauze Pads & Dressings (GAUZE DRESSING) 4"X4" PADS 1 each by Does not apply route 2 (two) times daily. 30 each 0  . insulin  aspart protamine- aspart (NOVOLOG MIX 70/30) (70-30) 100 UNIT/ML injection Inject 0.22 mLs (22 Units total) into the skin 2 (two) times daily with a meal. 10 mL 11  . metoprolol tartrate (LOPRESSOR) 25 MG tablet 1 1/2 tab bid 270 tablet 0  . potassium chloride SA (K-DUR,KLOR-CON) 20 MEQ tablet Take 1 tablet (20 mEq total) by mouth daily. 30 tablet 3  . Saccharomyces boulardii (PROBIOTIC) 250 MG CAPS Take 250 mg by mouth daily.    . simvastatin (ZOCOR) 20 MG tablet Take 1 tablet (20 mg total) by mouth at bedtime. 90 tablet 3  . torsemide (DEMADEX) 10 MG tablet Take 1 - 2 tablets daily for swelling 180 tablet 1  . oxyCODONE-acetaminophen (PERCOCET/ROXICET) 5-325 MG tablet Take 1 tablet by mouth every 4 (four) hours as needed for severe pain. 20 tablet 0   No current facility-administered medications on file prior to visit.     Allergies  Allergen Reactions  . Demerol [Meperidine] Itching  . Morphine And Related Itching    Family History  Problem Relation Age of Onset  . Diabetes Mellitus II Mother   . Diabetes type II Other   . Hypertension Maternal Grandfather   . Stroke Neg Hx   . Cancer Neg Hx     BP (!) 177/115 (BP Location: Left Wrist, Patient Position: Sitting, Cuff Size: Normal)   Pulse 77   Wt (!) 360 lb 12.8 oz (163.7 kg)   SpO2 97%   BMI 45.10 kg/m    Review of Systems denies weight loss, blurry vision, headache, chest pain, sob, n/v, muscle cramps, memory loss, depression, cold intolerance, rhinorrhea, and easy bruising.  He has dry skin and frequent urination.     Objective:   Physical Exam VS: see vs page GEN: no distress HEAD: head: no deformity eyes: no periorbital swelling, no proptosis external nose and ears are normal mouth: no lesion seen NECK: supple, thyroid is not enlarged CHEST WALL: no deformity LUNGS: clear to auscultation CV: reg rate and rhythm, no murmur ABD: abdomen is soft, nontender.  no hepatosplenomegaly.  not distended.  no  hernia MUSCULOSKELETAL: muscle bulk and strength are grossly normal.  no obvious joint swelling.  gait is normal and steady.  EXTEMITIES: no deformity.  no ulcer on the feet.  feet are of normal color and temp.  1+ bilat leg edema.  There is bilateral onychomycosis  of the toenails PULSES: dorsalis pedis intact bilat.  no carotid bruit NEURO:  cn 2-12 grossly intact.   readily moves all 4's.  sensation is intact to touch on the feet SKIN:  Normal texture and temperature.  No rash or suspicious lesion is visible.   NODES:  None palpable at the neck PSYCH: alert, well-oriented.  Does not appear anxious nor depressed.    I have reviewed outside records, and summarized:  Pt was noted to have elevated a1c, and referred here.  He was noted to have h/o med noncompliance.   Lab Results  Component Value Date   CREATININE 1.36 01/08/2018   BUN 21 01/08/2018   NA 138 01/08/2018   K 3.6 01/08/2018   CL 104 01/08/2018   CO2 25 01/08/2018   Lab Results  Component Value Date   HGBA1C 11.0 (H) 12/20/2017   I personally reviewed electrocardiogram tracing (12/20/17): Indication: acidosis Impression: ST.  No MI.  No hypertrophy. Compared to 2018: ST is new     Assessment & Plan:  type 1 DM, with stage 3 renal insuff: severe exacerbation.   Morbid obesity: new to me: he declines surgery.  Noncompliance with insulin, apparently improved, so current glycemic control is unknown.    Patient Instructions  Your blood pressure is high today.  Please see your primary care provider soon, to have it rechecked good diet and exercise significantly improve the control of your diabetes.  please let me know if you wish to be referred to a dietician.  high blood sugar is very risky to your health.  you should see an eye doctor and dentist every year.  It is very important to get all recommended vaccinations.  Controlling your blood pressure and cholesterol drastically reduces the damage diabetes does to your body.   Those who smoke should quit.  Please discuss these with your doctor.   check your blood sugar twice a day.  vary the time of day when you check, between before the 3 meals, and at bedtime.  also check if you have symptoms of your blood sugar being too high or too low.  please keep a record of the readings and bring it to your next appointment here (or you can bring the meter itself).  You can write it on any piece of paper.  please call us sooner if your blood sugar goes below 70, or if you have a lot of readings over 200.  Please continue the same insulin for now.  Please come back for a follow-up appointment in 1 month.

## 2018-02-01 NOTE — Telephone Encounter (Signed)
Sent in medication

## 2018-02-01 NOTE — Telephone Encounter (Signed)
Copied from CRM [id number]. Topic: Quick Communication - Rx Refill/Question >> Feb 01, 2018 11:22 AM Maia Petties wrote: Medication: oxyCODONE-acetaminophen (PERCOCET) 10-325 MG tablet - pt states that he is out of medication - he is taking it before [facility] nurse comes to repack wound because it does cause him pain.  Has the patient contacted their pharmacy? Yes - no refills Preferred Pharmacy (with phone number or street name): [id number] - [place], [place] - [address] AT [facility] [address] [facility] [address] [phone] (Phone) [phone] (Fax)

## 2018-02-02 ENCOUNTER — Telehealth: Payer: Self-pay

## 2018-02-02 ENCOUNTER — Telehealth: Payer: Self-pay | Admitting: Family Medicine

## 2018-02-02 DIAGNOSIS — E119 Type 2 diabetes mellitus without complications: Secondary | ICD-10-CM | POA: Insufficient documentation

## 2018-02-02 DIAGNOSIS — L0231 Cutaneous abscess of buttock: Secondary | ICD-10-CM | POA: Diagnosis not present

## 2018-02-02 DIAGNOSIS — Z794 Long term (current) use of insulin: Secondary | ICD-10-CM | POA: Diagnosis not present

## 2018-02-02 DIAGNOSIS — F329 Major depressive disorder, single episode, unspecified: Secondary | ICD-10-CM | POA: Diagnosis not present

## 2018-02-02 DIAGNOSIS — I5032 Chronic diastolic (congestive) heart failure: Secondary | ICD-10-CM | POA: Diagnosis not present

## 2018-02-02 DIAGNOSIS — E1122 Type 2 diabetes mellitus with diabetic chronic kidney disease: Secondary | ICD-10-CM | POA: Diagnosis not present

## 2018-02-02 DIAGNOSIS — N183 Chronic kidney disease, stage 3 (moderate): Secondary | ICD-10-CM | POA: Diagnosis not present

## 2018-02-02 DIAGNOSIS — G4733 Obstructive sleep apnea (adult) (pediatric): Secondary | ICD-10-CM | POA: Diagnosis not present

## 2018-02-02 DIAGNOSIS — I130 Hypertensive heart and chronic kidney disease with heart failure and stage 1 through stage 4 chronic kidney disease, or unspecified chronic kidney disease: Secondary | ICD-10-CM | POA: Diagnosis not present

## 2018-02-02 NOTE — Telephone Encounter (Signed)
Attempted to contact pt regarding blood pressure and medication questions; unable to leave message at [phone].

## 2018-02-02 NOTE — Telephone Encounter (Signed)
Spoke with patient and he stated that he is concerned because you did not discuss the dosage change prior to changing. He also stated that he is in a lot of pain when they are changing the dressing. He stated that they are changing it daily. I explained that I would let Dr. Earlene Plater know his concerns, but she would probably not increase the dose.

## 2018-02-02 NOTE — Telephone Encounter (Signed)
Patient called and discussed his blood pressure readings from his earlier call. He says "I called because I was told to do so by two health care providers, my home health nurse and my endocrinologist. The nurse came out last Thursday to change my dressing and said my BP reading was high, 160's/something. I went on Friday to get my wound checked and they told my it was 160's/something. Yesterday I went to the endocrinologist and it was high and he told me to call my primary care doctor. The only thing that is different is the Metoprolol I used to take was 37.5 mg tablet, but no one had them in stock. So the doctor ordered 25 mg and told me to take 1 1/2 tablets twice a day. I use a pill cutter to cut them in half, but they are so small and I wonder if I am taking the correct dosage." I advised that at his endocrinologist visit, his BP was 177/115. I asked had he taken his medication, he says "yes, every day."  I asked does he have a automatic BP cuff at home to check his BP, he says "no. I feel fine. I'm calling because I was told to do so."  I advised this would be sent over to Dr. Earlene Plater and someone will contact him with her recommendation, he verbalized understanding.

## 2018-02-02 NOTE — Telephone Encounter (Signed)
Patient is wanting to know why the oxyCODONE-acetaminophen (PERCOCET/ROXICET) 5-325 MG tablet instead of the 10-325.

## 2018-02-02 NOTE — Telephone Encounter (Signed)
Pt. Said the home health visit with wound dressing change went well.

## 2018-02-02 NOTE — Telephone Encounter (Signed)
Severity of pain has changed. I am also weaning him.

## 2018-02-02 NOTE — Telephone Encounter (Signed)
Copied from CRM [id number]. Topic: General - Other >> Feb 02, 2018 10:33 AM Percival Spanish wrote:  Pt call to say he picked up the below med ans is asking why he was only given 5 mg instead of the 10mg  that he previously had.    [phone]

## 2018-02-02 NOTE — Telephone Encounter (Signed)
Copied from CRM [id number]. Topic: Quick Communication - See Telephone Encounter >> Feb 02, 2018 11:51 AM Cipriano Bunker wrote: CRM for notification.  Pt. Called about his BP - when went to Endo his BP was 177/? Friday and then home health took it today 160/?. He is taking metoprolol tartrate (LOPRESSOR) 25 MG tablet 270 tablet 0 01/20/2018   Sig: 1 1/2 tab bid  Sent to pharmacy as: metoprolol tartrate (LOPRESSOR) 25 MG tablet   37.5 that he normally take was on BO until Oct.  He is asking what he should do.  He is taking a lower dosage.   Please call pt. And let know what to do   See Telephone encounter for: 02/02/18.

## 2018-02-02 NOTE — Telephone Encounter (Signed)
Attempted to contact pt regarding blood pressure and medication questions; unable to leave message at  [phone].

## 2018-02-03 NOTE — Telephone Encounter (Signed)
Called patent he is going to call sister and see when she can bring him. Will call back later today to make app.

## 2018-02-03 NOTE — Telephone Encounter (Signed)
Please advise 

## 2018-02-03 NOTE — Telephone Encounter (Signed)
Blood pressure has significantly changed.  Please have him come in for an office visit.

## 2018-02-08 DIAGNOSIS — I5032 Chronic diastolic (congestive) heart failure: Secondary | ICD-10-CM | POA: Diagnosis not present

## 2018-02-08 DIAGNOSIS — G4733 Obstructive sleep apnea (adult) (pediatric): Secondary | ICD-10-CM | POA: Diagnosis not present

## 2018-02-08 DIAGNOSIS — I130 Hypertensive heart and chronic kidney disease with heart failure and stage 1 through stage 4 chronic kidney disease, or unspecified chronic kidney disease: Secondary | ICD-10-CM | POA: Diagnosis not present

## 2018-02-08 DIAGNOSIS — Z794 Long term (current) use of insulin: Secondary | ICD-10-CM | POA: Diagnosis not present

## 2018-02-08 DIAGNOSIS — L0231 Cutaneous abscess of buttock: Secondary | ICD-10-CM | POA: Diagnosis not present

## 2018-02-08 DIAGNOSIS — E1122 Type 2 diabetes mellitus with diabetic chronic kidney disease: Secondary | ICD-10-CM | POA: Diagnosis not present

## 2018-02-08 DIAGNOSIS — N183 Chronic kidney disease, stage 3 (moderate): Secondary | ICD-10-CM | POA: Diagnosis not present

## 2018-02-08 DIAGNOSIS — F329 Major depressive disorder, single episode, unspecified: Secondary | ICD-10-CM | POA: Diagnosis not present

## 2018-02-12 ENCOUNTER — Ambulatory Visit: Payer: BLUE CROSS/BLUE SHIELD | Admitting: Family Medicine

## 2018-02-15 DIAGNOSIS — J9600 Acute respiratory failure, unspecified whether with hypoxia or hypercapnia: Secondary | ICD-10-CM | POA: Diagnosis not present

## 2018-02-15 DIAGNOSIS — R0600 Dyspnea, unspecified: Secondary | ICD-10-CM | POA: Diagnosis not present

## 2018-02-16 DIAGNOSIS — I130 Hypertensive heart and chronic kidney disease with heart failure and stage 1 through stage 4 chronic kidney disease, or unspecified chronic kidney disease: Secondary | ICD-10-CM | POA: Diagnosis not present

## 2018-02-16 DIAGNOSIS — F329 Major depressive disorder, single episode, unspecified: Secondary | ICD-10-CM | POA: Diagnosis not present

## 2018-02-16 DIAGNOSIS — G4733 Obstructive sleep apnea (adult) (pediatric): Secondary | ICD-10-CM | POA: Diagnosis not present

## 2018-02-16 DIAGNOSIS — Z794 Long term (current) use of insulin: Secondary | ICD-10-CM | POA: Diagnosis not present

## 2018-02-16 DIAGNOSIS — I5032 Chronic diastolic (congestive) heart failure: Secondary | ICD-10-CM | POA: Diagnosis not present

## 2018-02-16 DIAGNOSIS — E1122 Type 2 diabetes mellitus with diabetic chronic kidney disease: Secondary | ICD-10-CM | POA: Diagnosis not present

## 2018-02-16 DIAGNOSIS — N183 Chronic kidney disease, stage 3 (moderate): Secondary | ICD-10-CM | POA: Diagnosis not present

## 2018-02-16 DIAGNOSIS — L0231 Cutaneous abscess of buttock: Secondary | ICD-10-CM | POA: Diagnosis not present

## 2018-02-18 ENCOUNTER — Encounter: Payer: Self-pay | Admitting: Physician Assistant

## 2018-02-18 ENCOUNTER — Ambulatory Visit (INDEPENDENT_AMBULATORY_CARE_PROVIDER_SITE_OTHER): Payer: BLUE CROSS/BLUE SHIELD | Admitting: Physician Assistant

## 2018-02-18 VITALS — BP 128/82 | HR 89 | Temp 98.8°F | Resp 16 | Wt 356.0 lb

## 2018-02-18 DIAGNOSIS — E1159 Type 2 diabetes mellitus with other circulatory complications: Secondary | ICD-10-CM | POA: Diagnosis not present

## 2018-02-18 DIAGNOSIS — I152 Hypertension secondary to endocrine disorders: Secondary | ICD-10-CM

## 2018-02-18 DIAGNOSIS — I10 Essential (primary) hypertension: Secondary | ICD-10-CM | POA: Diagnosis not present

## 2018-02-18 MED ORDER — BLOOD PRESSURE KIT: 1.0000 | PACK | Freq: Once | 0 refills | Status: AC

## 2018-02-18 NOTE — Progress Notes (Signed)
Luis [name] is a 52 y.o. male is here to discuss: HTN   History of Present Illness:   Chief Complaint  Patient presents with  . Hypertension    HPI   Elevated BP readings from RN have been after wound dressing. Most recent dressing change was Tuesday, states that his blood pressure was 174/84. He also went to his endocrinologist on 02/01/18 and his BP was documented to be 177/115. He states that he has been walking about once a week, hoping to start walking more. He also states that his wound is essentially almost completely healed and he anticipates that he will not be receiving wound care much longer.  Currently taking Lopressor 50 mg BID. He is only supposed to be taking Lopressor 37.5 mg BID, per his prescription from PCP, however he states that he is unable to cut the pills so he can take 1.5 tablets so just takes 2 each time instead. Additionally, he takes 2 tablets of 10 mg torsemide daily. Denies any recent increase in swelling. Per chart review, it appears he was on Losartan at one point. We discussed this and he states that his sister didn't think he needed it so he hasn't been taking it.  Patient denies chest pain, SOB, blurred vision, dizziness, unusual headaches, lower leg swelling. Patient is compliant with medication. Denies excessive caffeine intake, stimulant usage, excessive alcohol intake, or increase in salt consumption.   Wt Readings from Last 15 Encounters:  02/18/18 (!) 356 lb (161.5 kg)  02/01/18 (!) 360 lb 12.8 oz (163.7 kg)  01/08/18 (!) 354 lb 12.8 oz (160.9 kg)  01/04/18 (!) 351 lb 6.6 oz (159.4 kg)  01/01/18 (!) 345 lb 3.2 oz (156.6 kg)  12/20/17 (!) 335 lb 5.1 oz (152.1 kg)  11/19/17 (!) 370 lb 12.8 oz (168.2 kg)  08/20/17 (!) 353 lb 12.8 oz (160.5 kg)  07/29/17 (!) 354 lb 9.6 oz (160.8 kg)  07/16/17 (!) 356 lb 9.6 oz (161.8 kg)  06/16/17 (!) 355 lb (161 kg)  06/11/17 (!) 355 lb 9.6 oz (161.3 kg)  04/23/17 (!) 343 lb (155.6 kg)  04/21/17 (!) 343 lb  3.2 oz (155.7 kg)  04/21/17 (!) 343 lb (155.6 kg)     Health Maintenance Due  Topic Date Due  . PNEUMOCOCCAL POLYSACCHARIDE VACCINE (1) 02/12/1970  . TETANUS/TDAP  02/13/1987  . OPHTHALMOLOGY EXAM  03/17/2017  . COLONOSCOPY  02/12/2018    Past Medical History:  Diagnosis Date  . Abnormal nuclear stress test 03/24/2016  . Atrial fibrillation ([place])   . CHF (congestive heart failure) ([place])   . DM (diabetes mellitus) ([place])   . Essential hypertension   . History of tobacco abuse   . HTN (hypertension)   . Hyperlipidemia   . Morbid obesity ([place])   . OSA (obstructive sleep apnea)   . Pneumonia 2017     Social History   Socioeconomic History  . Marital status: Single    Spouse name: Not on file  . Number of children: 1  . Years of education: 41  . Highest education level: Not on file  Occupational History  . Occupation: [employer]  Social Needs  . Financial resource strain: Not on file  . Food insecurity:    Worry: Not on file    Inability: Not on file  . Transportation needs:    Medical: Not on file    Non-medical: Not on file  Tobacco Use  . Smoking status: Former Smoker    Packs/day: 0.50  Years: 20.00    Pack years: 10.00    Types: Cigarettes    Last attempt to quit: 03/02/2009    Years since quitting: 8.9  . Smokeless tobacco: Never Used  Substance and Sexual Activity  . Alcohol use: No  . Drug use: No  . Sexual activity: Never  Lifestyle  . Physical activity:    Days per week: Not on file    Minutes per session: Not on file  . Stress: Not on file  Relationships  . Social connections:    Talks on phone: Not on file    Gets together: Not on file    Attends religious service: Not on file    Active member of club or organization: Not on file    Attends meetings of clubs or organizations: Not on file    Relationship status: Not on file  . Intimate partner violence:    Fear of current or ex partner: Not on file    Emotionally abused: Not on file     Physically abused: Not on file    Forced sexual activity: Not on file  Other Topics Concern  . Not on file  Social History Narrative   ** Merged History Encounter **       Lives in [place] with mother and three grandchildren.     Works for [employer] - works from home.     Does not routinely exercise.   Caffeine use: none   Fun/Hobby: Play video games and play with grandchildren.     Past Surgical History:  Procedure Laterality Date  . CARDIAC CATHETERIZATION N/A 03/24/2016   Procedure: Left Heart Cath and Coronary Angiography;  Surgeon: Leonie Man, MD;  Location: [facility];  Service: Cardiovascular;  Laterality: N/A;  . FEMUR IM NAIL Right 2012  . FRACTURE SURGERY      Family History  Problem Relation Age of Onset  . Diabetes Mellitus II Mother   . Diabetes type II Other   . Hypertension Maternal Grandfather   . Stroke Neg Hx   . Cancer Neg Hx     PMHx, SurgHx, SocialHx, FamHx, Medications, and Allergies were reviewed in the Visit Navigator and updated as appropriate.   Patient Active Problem List   Diagnosis Date Noted  . Diabetes ([place]) 02/02/2018  . Compliance poor, due to mulitple factors 01/02/2018  . Hypokalemia 01/01/2018  . Renal insufficiency 01/01/2018  . Cellulitis of right buttock   . Abscess of right buttock 12/24/2017  . DKA (diabetic ketoacidoses) ([place]), 12/20/17 12/20/2017  . Onychomycosis, followed by Podiatry 06/14/2017  . Bilateral edema of lower extremity 06/14/2017  . Hyperlipidemia, elevated LDL, low HDL, noncompliant with statin   . Difficulty walking, debility, deconditioned 06/11/2017  . OSA (obstructive sleep apnea), mild, not using CPAP 03/31/2017  . NICM (nonischemic cardiomyopathy) ([place]), left heart cath and coronary angiography 2017   . Morbid obesity ([place])   . Former smoker   . Chronic diastolic CHF (congestive heart failure) ([place]), ECHO 03/2017 EF 50-55%, on Torsemide   . Hypertension associated with diabetes  ([place]) 03/19/2016  . Paroxysmal atrial fibrillation ([place]) 03/19/2016    Social History   Tobacco Use  . Smoking status: Former Smoker    Packs/day: 0.50    Years: 20.00    Pack years: 10.00    Types: Cigarettes    Last attempt to quit: 03/02/2009    Years since quitting: 8.9  . Smokeless tobacco: Never Used  Substance Use Topics  .  Alcohol use: No  . Drug use: No    Current Medications and Allergies:    Current Outpatient Medications:  .  albuterol (PROVENTIL HFA;VENTOLIN HFA) 108 (90 Base) MCG/ACT inhaler, Inhale 2 puffs into the lungs every 4 (four) hours as needed for wheezing., Disp: 1 Inhaler, Rfl: 0 .  aspirin EC 81 MG EC tablet, Take 1 tablet (81 mg total) by mouth daily., Disp: 30 tablet, Rfl: 0 .  blood glucose meter kit and supplies, Dispense based on patient and insurance preference. Use up to four times daily as directed. (FOR ICD-10 E10.9, E11.9)., Disp: 1 each, Rfl: 0 .  insulin aspart protamine- aspart (NOVOLOG MIX 70/30) (70-30) 100 UNIT/ML injection, Inject 0.22 mLs (22 Units total) into the skin 2 (two) times daily with a meal., Disp: 10 mL, Rfl: 11 .  metoprolol tartrate (LOPRESSOR) 25 MG tablet, 1 1/2 tab bid, Disp: 270 tablet, Rfl: 0 .  potassium chloride SA (K-DUR,KLOR-CON) 20 MEQ tablet, Take 1 tablet (20 mEq total) by mouth daily., Disp: 30 tablet, Rfl: 3 .  Saccharomyces boulardii (PROBIOTIC) 250 MG CAPS, Take 250 mg by mouth daily., Disp: , Rfl:  .  simvastatin (ZOCOR) 20 MG tablet, Take 1 tablet (20 mg total) by mouth at bedtime., Disp: 90 tablet, Rfl: 3 .  torsemide (DEMADEX) 10 MG tablet, Take 1 - 2 tablets daily for swelling, Disp: 180 tablet, Rfl: 1 .  Blood Pressure KIT, 1 each by Does not apply route once for 1 dose., Disp: 1 each, Rfl: 0 .  Gauze Pads & Dressings (GAUZE DRESSING) 4"X4" PADS, 1 each by Does not apply route 2 (two) times daily. (Patient not taking: Reported on 02/18/2018), Disp: 30 each, Rfl: 0 .  oxyCODONE-acetaminophen  (PERCOCET/ROXICET) 5-325 MG tablet, Take 1 tablet by mouth every 4 (four) hours as needed for severe pain. (Patient not taking: Reported on 02/18/2018), Disp: 20 tablet, Rfl: 0   Allergies  Allergen Reactions  . Demerol [Meperidine] Itching  . Morphine And Related Itching    Review of Systems   ROS  Negative unless otherwise specified per HPI.   Vitals:   Vitals:   02/18/18 0951  BP: 128/82  Pulse: 89  Resp: 16  Temp: 98.8 F (37.1 C)  TempSrc: Oral  SpO2: 95%  Weight: (!) 356 lb (161.5 kg)     Body mass index is 44.5 kg/m.   Physical Exam:    Physical Exam  Constitutional: He appears well-developed. He is cooperative.  Non-toxic appearance. He does not have a sickly appearance. He does not appear ill. No distress.  Cardiovascular: Normal rate, regular rhythm, S1 normal, S2 normal, normal heart sounds and normal pulses.  No LE edema  Pulmonary/Chest: Effort normal and breath sounds normal.  Neurological: He is alert. GCS eye subscore is 4. GCS verbal subscore is 5. GCS motor subscore is 6.  Skin: Skin is warm, dry and intact.  Psychiatric: He has a normal mood and affect. His speech is normal and behavior is normal.  Nursing note and vitals reviewed.    Assessment and Plan:    Problem List Items Addressed This Visit      Cardiovascular and Mediastinum   Hypertension associated with diabetes ([place]) - Primary    BP at goal at today's visit, even on re-check. Denies symptoms. No red flags on exam. Continue current medication regimen. I did provide written prescription for blood pressure monitor for him, we discussed at length the importance of monitoring blood pressure as well as weight  on a regular basis. Follow-up in two months. He was advised that if BP is consistently >140/90 to make an appointment sooner, as well as if he develops any new symptoms or has significant weight changes. Patient verbalizes understanding.          . Reviewed expectations re: course  of current medical issues. . Discussed self-management of symptoms. . Outlined signs and symptoms indicating need for more acute intervention. . Patient verbalized understanding and all questions were answered. . See orders for this visit as documented in the electronic medical record. . Patient received an After Visit Summary.   Inda Coke, PA-C [place], [place] 02/18/2018  Follow-up: No follow-ups on file.

## 2018-02-18 NOTE — Patient Instructions (Addendum)
It was great to meet you!  Please get a blood pressure monitor if you can.   Keep an eye on blood pressure - if consistently >140/90, please let me know. If you develop any symptoms: chest pain, shortness of breath, unusual headaches, changes in vision, uncontrolled leg swelling, significant weight change of >5 lb between your daily weights -- seek medical attention.  Follow-up in our office in 2 months.

## 2018-02-18 NOTE — Assessment & Plan Note (Addendum)
BP at goal at today's visit, even on re-check. Denies symptoms. No red flags on exam. Continue current medication regimen. I did provide written prescription for blood pressure monitor for him, we discussed at length the importance of monitoring blood pressure as well as weight on a regular basis. Follow-up in two months. He was advised that if BP is consistently >140/90 to make an appointment sooner, as well as if he develops any new symptoms or has significant weight changes. Patient verbalizes understanding.

## 2018-02-22 DIAGNOSIS — K610 Anal abscess: Secondary | ICD-10-CM | POA: Diagnosis not present

## 2018-02-24 ENCOUNTER — Telehealth: Payer: Self-pay | Admitting: Family Medicine

## 2018-02-24 NOTE — Telephone Encounter (Signed)
FYI

## 2018-02-24 NOTE — Telephone Encounter (Signed)
Copied from CRM [id number]. Topic: Quick Communication - See Telephone Encounter >> Feb 24, 2018 11:51 AM Lorrine Kin, NT wrote: CRM for notification. See Telephone encounter for: 02/24/18. Marcelino Duster from [facility] calling to let Dr Earlene Plater know that the patient missed his home health appointment today. CB# [phone]

## 2018-03-04 DIAGNOSIS — F329 Major depressive disorder, single episode, unspecified: Secondary | ICD-10-CM | POA: Diagnosis not present

## 2018-03-04 DIAGNOSIS — I130 Hypertensive heart and chronic kidney disease with heart failure and stage 1 through stage 4 chronic kidney disease, or unspecified chronic kidney disease: Secondary | ICD-10-CM | POA: Diagnosis not present

## 2018-03-04 DIAGNOSIS — E1122 Type 2 diabetes mellitus with diabetic chronic kidney disease: Secondary | ICD-10-CM | POA: Diagnosis not present

## 2018-03-04 DIAGNOSIS — L0231 Cutaneous abscess of buttock: Secondary | ICD-10-CM | POA: Diagnosis not present

## 2018-03-04 DIAGNOSIS — N183 Chronic kidney disease, stage 3 (moderate): Secondary | ICD-10-CM | POA: Diagnosis not present

## 2018-03-04 DIAGNOSIS — Z794 Long term (current) use of insulin: Secondary | ICD-10-CM | POA: Diagnosis not present

## 2018-03-04 DIAGNOSIS — I5032 Chronic diastolic (congestive) heart failure: Secondary | ICD-10-CM | POA: Diagnosis not present

## 2018-03-04 DIAGNOSIS — G4733 Obstructive sleep apnea (adult) (pediatric): Secondary | ICD-10-CM | POA: Diagnosis not present

## 2018-03-11 ENCOUNTER — Ambulatory Visit: Payer: BLUE CROSS/BLUE SHIELD | Admitting: Endocrinology

## 2018-03-17 DIAGNOSIS — R0600 Dyspnea, unspecified: Secondary | ICD-10-CM | POA: Diagnosis not present

## 2018-03-17 DIAGNOSIS — J9600 Acute respiratory failure, unspecified whether with hypoxia or hypercapnia: Secondary | ICD-10-CM | POA: Diagnosis not present

## 2018-03-30 ENCOUNTER — Other Ambulatory Visit: Payer: Self-pay | Admitting: Family Medicine

## 2018-03-31 ENCOUNTER — Telehealth: Payer: Self-pay | Admitting: Family Medicine

## 2018-03-31 NOTE — Telephone Encounter (Signed)
I think that this patient is switching to Dr. [name]?

## 2018-03-31 NOTE — Telephone Encounter (Signed)
Copied from CRM [id number]. Topic: General - Other >> Mar 31, 2018  3:54 PM Percival Spanish wrote: Pt call to say he has been taking Novolin 70/30 and has read the side effects and will no longer take ,did not pick up the rx that was called in by Dr Earlene Plater in March cause he could not afford. Novolin he will no longer be taking and is asking for new RX to be called in and is requesting that it be in the pen formula and not the injection. He said if the below med does not come in pens can something be found that comes in pens       insulin aspart protamine- aspart (NOVOLOG MIX 70/30) (70-30) 100 UNIT/ML injection        pt phone [phone]    [facility]

## 2018-04-01 NOTE — Telephone Encounter (Signed)
He switched to [employer]. Sounds like he definitely needs to be seen.

## 2018-04-02 NOTE — Telephone Encounter (Signed)
Patient states he is not asking for a change in his medication, he just wants it called in in pen form because it is difficult for him to use the needles. Patient somewhat argumentative while I explained that Dr. [name] could not change his prescriptions until he is seen.  He told me that "makes no sense" and he "should not have to be seen" just to have insulin pens called in because he is not asking for his medication to be changed.  States he is taking a generic insulin right now because his medication was too expensive and the generic insulin lists death as a possible side effect so he wants to stop taking it.  Continued to state he just wants the insulin pens sent in.  Spent nearly 20 minutes on the phone with the patient.  Please advise.

## 2018-04-02 NOTE — Telephone Encounter (Signed)
Per Dr. [name], patient needs to continue taking his medications as they were prescribed to them by Dr. Earlene Plater until he is able to be seen.

## 2018-04-05 ENCOUNTER — Other Ambulatory Visit: Payer: Self-pay

## 2018-04-05 MED ORDER — INSULIN ASPART PROT & ASPART (70-30 MIX) 100 UNIT/ML PEN: 22.0000 [IU] | PEN_INJECTOR | Freq: Two times a day (BID) | SUBCUTANEOUS | 11 refills | Status: DC

## 2018-04-05 MED ORDER — INSULIN PEN NEEDLE 32G X 6 MM MISC: 11 refills | Status: AC

## 2018-04-05 NOTE — Telephone Encounter (Signed)
Rx sent to patient's pharmacy

## 2018-04-05 NOTE — Telephone Encounter (Signed)
I am with sending in pen form.  [name]. [name], MD 04/05/2018 9:34 AM

## 2018-04-17 DIAGNOSIS — R0600 Dyspnea, unspecified: Secondary | ICD-10-CM | POA: Diagnosis not present

## 2018-04-17 DIAGNOSIS — J9600 Acute respiratory failure, unspecified whether with hypoxia or hypercapnia: Secondary | ICD-10-CM | POA: Diagnosis not present

## 2018-04-20 ENCOUNTER — Telehealth: Payer: Self-pay

## 2018-04-20 NOTE — Telephone Encounter (Signed)
auth submitte (Key: [id number])  Rx #: [id number]  NovoLOG Mix 70/30 FlexPen (70-30) 100UNIT/ML pen-injectors  Via cover my meds

## 2018-04-22 ENCOUNTER — Other Ambulatory Visit: Payer: Self-pay | Admitting: Family Medicine

## 2018-04-22 ENCOUNTER — Ambulatory Visit: Payer: BLUE CROSS/BLUE SHIELD | Admitting: Family Medicine

## 2018-04-23 NOTE — Telephone Encounter (Signed)
Please advise on refill. Patient canceled appointment with Dr. [name] from yesterday.

## 2018-04-30 ENCOUNTER — Telehealth: Payer: Self-pay | Admitting: Family Medicine

## 2018-04-30 ENCOUNTER — Other Ambulatory Visit: Payer: Self-pay | Admitting: Family Medicine

## 2018-04-30 ENCOUNTER — Encounter: Payer: Self-pay | Admitting: Podiatry

## 2018-04-30 ENCOUNTER — Ambulatory Visit (INDEPENDENT_AMBULATORY_CARE_PROVIDER_SITE_OTHER): Payer: BLUE CROSS/BLUE SHIELD | Admitting: Podiatry

## 2018-04-30 DIAGNOSIS — E876 Hypokalemia: Secondary | ICD-10-CM

## 2018-04-30 DIAGNOSIS — B351 Tinea unguium: Secondary | ICD-10-CM

## 2018-04-30 DIAGNOSIS — E119 Type 2 diabetes mellitus without complications: Secondary | ICD-10-CM | POA: Diagnosis not present

## 2018-04-30 DIAGNOSIS — M79676 Pain in unspecified toe(s): Secondary | ICD-10-CM | POA: Diagnosis not present

## 2018-04-30 DIAGNOSIS — M79609 Pain in unspecified limb: Principal | ICD-10-CM

## 2018-04-30 NOTE — Progress Notes (Signed)
Complaint:  Visit Type: Patient returns to my office for continued preventative foot care services. Complaint: Patient states" my nails have grown long and thick and become painful to walk and wear shoes" Patient has been diagnosed with DM with no foot complications. The patient presents for preventative foot care services. No changes to ROS  Podiatric Exam: Vascular: dorsalis pedis and posterior tibial pulses are palpable bilateral. Capillary return is immediate. Temperature gradient is WNL. Skin turgor WNL  Sensorium: Normal Semmes Weinstein monofilament test. Normal tactile sensation bilaterally. Nail Exam: Pt has thick disfigured discolored nails with subungual debris noted bilateral entire nail hallux through fifth toenails Ulcer Exam: There is no evidence of ulcer or pre-ulcerative changes or infection. Orthopedic Exam: Muscle tone and strength are WNL. No limitations in general ROM. No crepitus or effusions noted. Foot type and digits show no abnormalities. Bony prominences are unremarkable. Skin: No Porokeratosis. No infection or ulcers  Diagnosis:  Onychomycosis, , Pain in right toe, pain in left toes  Treatment & Plan Procedures and Treatment: Consent by patient was obtained for treatment procedures.   Debridement of mycotic and hypertrophic toenails, 1 through 5 bilateral and clearing of subungual debris. No ulceration, no infection noted.  Return Visit-Office Procedure: Patient instructed to return to the office for a follow up visit 3 months for continued evaluation and treatment.    Anjannette Gauger DPM 

## 2018-04-30 NOTE — Telephone Encounter (Signed)
Copied from CRM [id number]. Topic: Quick Communication - See Telephone Encounter >> Apr 30, 2018 10:31 AM Mare Loan F wrote: Pt is calling to to say that he usually gets his insulin novalin 70/30 with out a rx on file but [facility] at [address] will not give it to him with out the rx so he is asking that dr. Earlene Plater can call this into [facility] for him   Best number is [phone]

## 2018-05-01 NOTE — Telephone Encounter (Signed)
Notified pt that refills of requested medication was available at [facility] on [address]. Pt states he will contact the pharmacy for refills of the medication.

## 2018-05-15 ENCOUNTER — Other Ambulatory Visit: Payer: Self-pay | Admitting: Family Medicine

## 2018-05-15 DIAGNOSIS — I509 Heart failure, unspecified: Secondary | ICD-10-CM

## 2018-05-16 ENCOUNTER — Telehealth: Payer: Self-pay | Admitting: Family Medicine

## 2018-05-17 NOTE — Telephone Encounter (Signed)
Patient said thanks but he does not think he needs to come for an appointment just for refills. Tried to explain to him he has to be seen for another refill. He said he must not need to take it then if he has to be seen just to get a refill. He said he leaves 30 mins away and thinks that is ridiculous. He only is available to come in on thursdays and Dr Earlene Plater is off. He said can he just transfer his care since Dr Earlene Plater is off on his only day off.  [phone].

## 2018-05-17 NOTE — Telephone Encounter (Signed)
Tried to call patient to schedule. Has not been seen since 02/2018. OK to refill.

## 2018-05-17 NOTE — Telephone Encounter (Signed)
Per Dr. Earlene Plater gave patient a 30 day refill. Patient needs to be seen for further refills. I have called patient to schedule. No answer or voice mail set up.

## 2018-05-17 NOTE — Telephone Encounter (Signed)
I think he was switching to [place]?

## 2018-05-17 NOTE — Telephone Encounter (Signed)
See note

## 2018-05-18 NOTE — Telephone Encounter (Signed)
Noted  

## 2018-05-18 NOTE — Telephone Encounter (Signed)
There was already a message for patient to transfer to Dr. [name]. Patient No Showed that appointment on 04/22/18. Please call to get him scheduled with Dr. [name]. Patient has several health issues so needs to be seen.

## 2018-05-18 NOTE — Telephone Encounter (Signed)
I attempted to call the patient to get him scheduled with Dr. [name] as a TOC appointment for his medications. I was unable to leave a voicemail. Awaiting patient's call back to get scheduled.

## 2018-05-28 ENCOUNTER — Other Ambulatory Visit: Payer: Self-pay | Admitting: Family Medicine

## 2018-05-28 NOTE — Telephone Encounter (Signed)
Please advise looks like patient was transferring to [place] but no showed app and is working on making another one?

## 2018-05-28 NOTE — Telephone Encounter (Signed)
Okay but last fill. He MUST follow up.

## 2018-06-09 ENCOUNTER — Other Ambulatory Visit: Payer: Self-pay | Admitting: Family Medicine

## 2018-06-09 DIAGNOSIS — E876 Hypokalemia: Secondary | ICD-10-CM

## 2018-06-09 NOTE — Telephone Encounter (Signed)
Patient has appointment with Dr. [name] tomorrow.

## 2018-06-10 ENCOUNTER — Encounter: Payer: Self-pay | Admitting: Family Medicine

## 2018-06-10 ENCOUNTER — Ambulatory Visit (INDEPENDENT_AMBULATORY_CARE_PROVIDER_SITE_OTHER): Payer: BLUE CROSS/BLUE SHIELD | Admitting: Family Medicine

## 2018-06-10 VITALS — BP 124/78 | HR 86 | Temp 98.4°F | Ht 75.0 in | Wt 363.0 lb

## 2018-06-10 DIAGNOSIS — Z794 Long term (current) use of insulin: Secondary | ICD-10-CM | POA: Diagnosis not present

## 2018-06-10 DIAGNOSIS — Z1211 Encounter for screening for malignant neoplasm of colon: Secondary | ICD-10-CM

## 2018-06-10 DIAGNOSIS — I5032 Chronic diastolic (congestive) heart failure: Secondary | ICD-10-CM

## 2018-06-10 DIAGNOSIS — I10 Essential (primary) hypertension: Secondary | ICD-10-CM

## 2018-06-10 DIAGNOSIS — E785 Hyperlipidemia, unspecified: Secondary | ICD-10-CM

## 2018-06-10 DIAGNOSIS — I152 Hypertension secondary to endocrine disorders: Secondary | ICD-10-CM

## 2018-06-10 DIAGNOSIS — E1159 Type 2 diabetes mellitus with other circulatory complications: Secondary | ICD-10-CM

## 2018-06-10 DIAGNOSIS — G4733 Obstructive sleep apnea (adult) (pediatric): Secondary | ICD-10-CM

## 2018-06-10 DIAGNOSIS — E1169 Type 2 diabetes mellitus with other specified complication: Secondary | ICD-10-CM

## 2018-06-10 DIAGNOSIS — Z711 Person with feared health complaint in whom no diagnosis is made: Secondary | ICD-10-CM

## 2018-06-10 DIAGNOSIS — E1165 Type 2 diabetes mellitus with hyperglycemia: Secondary | ICD-10-CM

## 2018-06-10 LAB — COMPREHENSIVE METABOLIC PANEL
ALBUMIN: 4 g/dL (ref 3.5–5.2)
ALT: 16 U/L (ref 0–53)
AST: 14 U/L (ref 0–37)
Alkaline Phosphatase: 133 U/L — ABNORMAL HIGH (ref 39–117)
BUN: 13 mg/dL (ref 6–23)
CHLORIDE: 105 meq/L (ref 96–112)
CO2: 24 mEq/L (ref 19–32)
CREATININE: 1.08 mg/dL (ref 0.40–1.50)
Calcium: 9.1 mg/dL (ref 8.4–10.5)
GFR: 92.95 mL/min (ref >60.00)
GLUCOSE: 114 mg/dL — AB (ref 70–99)
POTASSIUM: 4 meq/L (ref 3.5–5.1)
SODIUM: 137 meq/L (ref 135–145)
Total Bilirubin: 0.6 mg/dL (ref 0.2–1.2)
Total Protein: 7.6 g/dL (ref 6.0–8.3)

## 2018-06-10 LAB — CBC
HEMATOCRIT: 44.6 % (ref 39.0–52.0)
Hemoglobin: 15 g/dL (ref 13.0–17.0)
MCHC: 33.5 g/dL (ref 30.0–36.0)
MCV: 92.8 fl (ref 78.0–100.0)
Platelets: 264 10*3/uL (ref 150.0–400.0)
RBC: 4.81 Mil/uL (ref 4.22–5.81)
RDW: 13.9 % (ref 11.5–15.5)
WBC: 7.2 10*3/uL (ref 4.0–10.5)

## 2018-06-10 LAB — LIPID PANEL
Cholesterol: 123 mg/dL (ref 0–200)
HDL: 29.5 mg/dL — AB (ref >39.00)
LDL Cholesterol: 79 mg/dL (ref 0–99)
NONHDL: 93.32
Total CHOL/HDL Ratio: 4
Triglycerides: 73 mg/dL (ref 0.0–149.0)
VLDL: 14.6 mg/dL (ref 0.0–40.0)

## 2018-06-10 LAB — HEMOGLOBIN A1C: HEMOGLOBIN A1C: 7.2 % — AB (ref 4.6–6.5)

## 2018-06-10 MED ORDER — METOPROLOL TARTRATE 50 MG PO TABS: 50.0000 mg | ORAL_TABLET | Freq: Two times a day (BID) | ORAL | 3 refills | Status: DC

## 2018-06-10 NOTE — Patient Instructions (Signed)
It was very nice to see you today!  We will check blood work today.  I will send in a new prescription for your metoprolol.  Please take 1 pill twice daily.  I will see you back in 3 to 6 months depending on your blood work.  Take care, Dr [name]   Colorectal Cancer Screening Colorectal cancer screening is a group of tests used to check for colorectal cancer. Colorectal refers to your colon and rectum. Your colon and rectum are located at the end of your large intestine and carry your bowel movements out of your body. Why is colorectal cancer screening done? It is common for abnormal growths (polyps) to form in the lining of your colon, especially as you get older. These polyps can be cancerous or become cancerous. If colorectal cancer is found at an early stage, it is treatable. Who should be screened for colorectal cancer? Screening is recommended for all adults at average risk starting at age 21. Tests may be recommended every 1 to 10 years. Your health care provider may recommend earlier or more frequent screening if you have:  A history of colorectal cancer or polyps.  A family member with a history of colorectal cancer or polyps.  Inflammatory bowel disease, such as ulcerative colitis or Crohn disease.  A type of hereditary colon cancer syndrome.  Colorectal cancer symptoms.  Types of screening tests There are several types of colorectal screening tests. They include:  Guaiac-based fecal occult blood testing.  Fecal immunochemical test (FIT).  Stool DNA test.  Barium enema.  Virtual colonoscopy.  Sigmoidoscopy. During this test, a sigmoidoscope is used to examine your rectum and lower colon. A sigmoidoscope is a flexible tube with a camera that is inserted through your anus into your rectum and lower colon.  Colonoscopy. During this test, a colonoscope is used to examine your entire colon. A colonoscope is a long, thin, flexible tube with a camera. This test examines  your entire colon and rectum.  This information is not intended to replace advice given to you by your health care provider. Make sure you discuss any questions you have with your health care provider. Document Released: 04/09/2010 Document Revised: 05/29/2016 Document Reviewed: 01/26/2014 Elsevier Interactive Patient Education  [employer].

## 2018-06-10 NOTE — Assessment & Plan Note (Signed)
His reported blood sugars seem to be within an acceptable range.  We will continue his current dose of insulin aspart 70/30 22 units twice daily with meals.  We will check A1c today.

## 2018-06-10 NOTE — Progress Notes (Signed)
   Subjective:  Luis [name] is a 52 y.o. male who presents today with a chief complaint of HTN and to transfer care.   HPI:  HTN, chronic problem, stable Currently takes metoprolol tartrate 50 mg twice daily.  Tolerates well without side effects.  No reported chest pain or shortness of breath.    CHF/NICM, chronic problem, stable Symptoms are stable.  Has a small amount of lower extremity swelling.  Takes torsemide 10 mg twice daily which he is tolerating well.  Hyperlipidemia, chronic problem, stable Currently on simvastatin 10 mg daily.  He tolerates this well without reported side effects.  Diabetes, chronic problem, stable Currently on insulin aspart 70/30 22 units twice daily with meals.  Checks his blood sugar twice daily and reports they are typically in the 130s to 150s range.  He has been on Trulicity in the past, but has never been on any other medications.  OSA, chronic problem, stable Does not wear CPAP.  He is interested in trying it again.  Concern about penis abnormality Patient also concerned that his penis has shrunk in size over the last several months.  No pain to the area.   ROS: Per HPI  PMH: He reports that he quit smoking about 9 years ago. His smoking use included cigarettes. He has a 10.00 pack-year smoking history. He has never used smokeless tobacco. He reports that he does not drink alcohol or use drugs.  Objective:  Physical Exam: BP 124/78 (BP Location: Left Arm, Patient Position: Sitting, Cuff Size: Large)   Pulse 86   Temp 98.4 F (36.9 C) (Oral)   Ht 6\' 3"  (1.905 m)   Wt (!) 363 lb (164.7 kg)   SpO2 95%   BMI 45.37 kg/m   Wt Readings from Last 3 Encounters:  06/10/18 (!) 363 lb (164.7 kg)  02/18/18 (!) 356 lb (161.5 kg)  02/01/18 (!) 360 lb 12.8 oz (163.7 kg)  Gen: NAD, resting comfortably CV: RRR with no murmurs appreciated Pulm: NWOB, CTAB with no crackles, wheezes, or rhonchi GU: Normal male genitalia.   Uncircumcised.   Assessment/Plan:  Diabetes (HCC) His reported blood sugars seem to be within an acceptable range.  We will continue his current dose of insulin aspart 70/30 22 units twice daily with meals.  We will check A1c today.  OSA (obstructive sleep apnea), mild, not using CPAP Encouraged CPAP use.  Hypertension associated with diabetes (HCC) At goal.  Continue metoprolol 50 mg twice daily.  Hyperlipidemia associated with type 2 diabetes mellitus (HCC) Check lipid panel.  Continue simvastatin 20 mg daily.  Chronic diastolic CHF (congestive heart failure) (HCC), ECHO 03/2017 EF 50-55%, on Torsemide No signs of overload.  Continue torsemide 10 mg twice daily.  Check CMET CBC today.  Concerned about male genital abnormality No abnormality on exam.  Reassured patient.  Preventative healthcare We will set up colon cancer screening today.  [name]. [name], MD 06/10/2018 10:13 AM

## 2018-06-10 NOTE — Assessment & Plan Note (Signed)
Encouraged CPAP use.   

## 2018-06-10 NOTE — Assessment & Plan Note (Signed)
No signs of overload.  Continue torsemide 10 mg twice daily.  Check CMET CBC today.

## 2018-06-10 NOTE — Assessment & Plan Note (Signed)
At goal.  Continue metoprolol 50 mg twice daily.

## 2018-06-10 NOTE — Assessment & Plan Note (Signed)
Check lipid panel. Continue simvastatin 20 mg daily. 

## 2018-06-15 NOTE — Progress Notes (Signed)
Please inform patient of the following:  His blood counts are normal. Electrolytes, kidney function, and liver function are all normal. His "good" cholesterol is a bit low but all of his other cholesterol levels are normal.   His A1c looks great! He should keep up the good work  Do not need to make any changes to his treatment plan at this time. I would like to see him back for his follow up visit in 3-6 months.  [name]. [name], MD 06/15/2018 12:13 PM

## 2018-07-03 ENCOUNTER — Other Ambulatory Visit: Payer: Self-pay | Admitting: Family Medicine

## 2018-07-03 DIAGNOSIS — I509 Heart failure, unspecified: Secondary | ICD-10-CM

## 2018-07-06 NOTE — Telephone Encounter (Signed)
Luis [name].  

## 2018-07-09 ENCOUNTER — Other Ambulatory Visit: Payer: Self-pay | Admitting: Family Medicine

## 2018-07-09 DIAGNOSIS — E876 Hypokalemia: Secondary | ICD-10-CM

## 2018-07-23 ENCOUNTER — Encounter: Payer: Self-pay | Admitting: Podiatry

## 2018-07-23 ENCOUNTER — Ambulatory Visit (INDEPENDENT_AMBULATORY_CARE_PROVIDER_SITE_OTHER): Payer: BLUE CROSS/BLUE SHIELD | Admitting: Podiatry

## 2018-07-23 DIAGNOSIS — M79676 Pain in unspecified toe(s): Secondary | ICD-10-CM

## 2018-07-23 DIAGNOSIS — E119 Type 2 diabetes mellitus without complications: Secondary | ICD-10-CM | POA: Diagnosis not present

## 2018-07-23 DIAGNOSIS — B351 Tinea unguium: Secondary | ICD-10-CM

## 2018-07-23 DIAGNOSIS — M79609 Pain in unspecified limb: Principal | ICD-10-CM

## 2018-07-23 NOTE — Progress Notes (Signed)
Complaint:  Visit Type: Patient returns to my office for continued preventative foot care services. Complaint: Patient states" my nails have grown long and thick and become painful to walk and wear shoes" Patient has been diagnosed with DM with no foot complications. The patient presents for preventative foot care services. No changes to ROS  Podiatric Exam: Vascular: dorsalis pedis and posterior tibial pulses are palpable bilateral. Capillary return is immediate. Temperature gradient is WNL. Skin turgor WNL  Sensorium: Normal Semmes Weinstein monofilament test. Normal tactile sensation bilaterally. Nail Exam: Pt has thick disfigured discolored nails with subungual debris noted bilateral entire nail hallux through fifth toenails Ulcer Exam: There is no evidence of ulcer or pre-ulcerative changes or infection. Orthopedic Exam: Muscle tone and strength are WNL. No limitations in general ROM. No crepitus or effusions noted. Foot type and digits show no abnormalities. Bony prominences are unremarkable. Skin: No Porokeratosis. No infection or ulcers  Diagnosis:  Onychomycosis, , Pain in right toe, pain in left toes  Treatment & Plan Procedures and Treatment: Consent by patient was obtained for treatment procedures.   Debridement of mycotic and hypertrophic toenails, 1 through 5 bilateral and clearing of subungual debris. No ulceration, no infection noted.  Return Visit-Office Procedure: Patient instructed to return to the office for a follow up visit 3 months for continued evaluation and treatment.    Casmere Hollenbeck DPM 

## 2018-08-05 ENCOUNTER — Other Ambulatory Visit: Payer: Self-pay | Admitting: Family Medicine

## 2018-08-05 DIAGNOSIS — I509 Heart failure, unspecified: Secondary | ICD-10-CM

## 2018-08-05 DIAGNOSIS — E876 Hypokalemia: Secondary | ICD-10-CM

## 2018-10-15 IMAGING — DX DG CHEST 1V PORT
1 series · 1 of 1 positions shown · non-contrast
Comparison: 03/01/2017

CLINICAL DATA: Shortness of breath, OG tube placement

EXAM:
PORTABLE CHEST 1 VIEW

[chest ap]
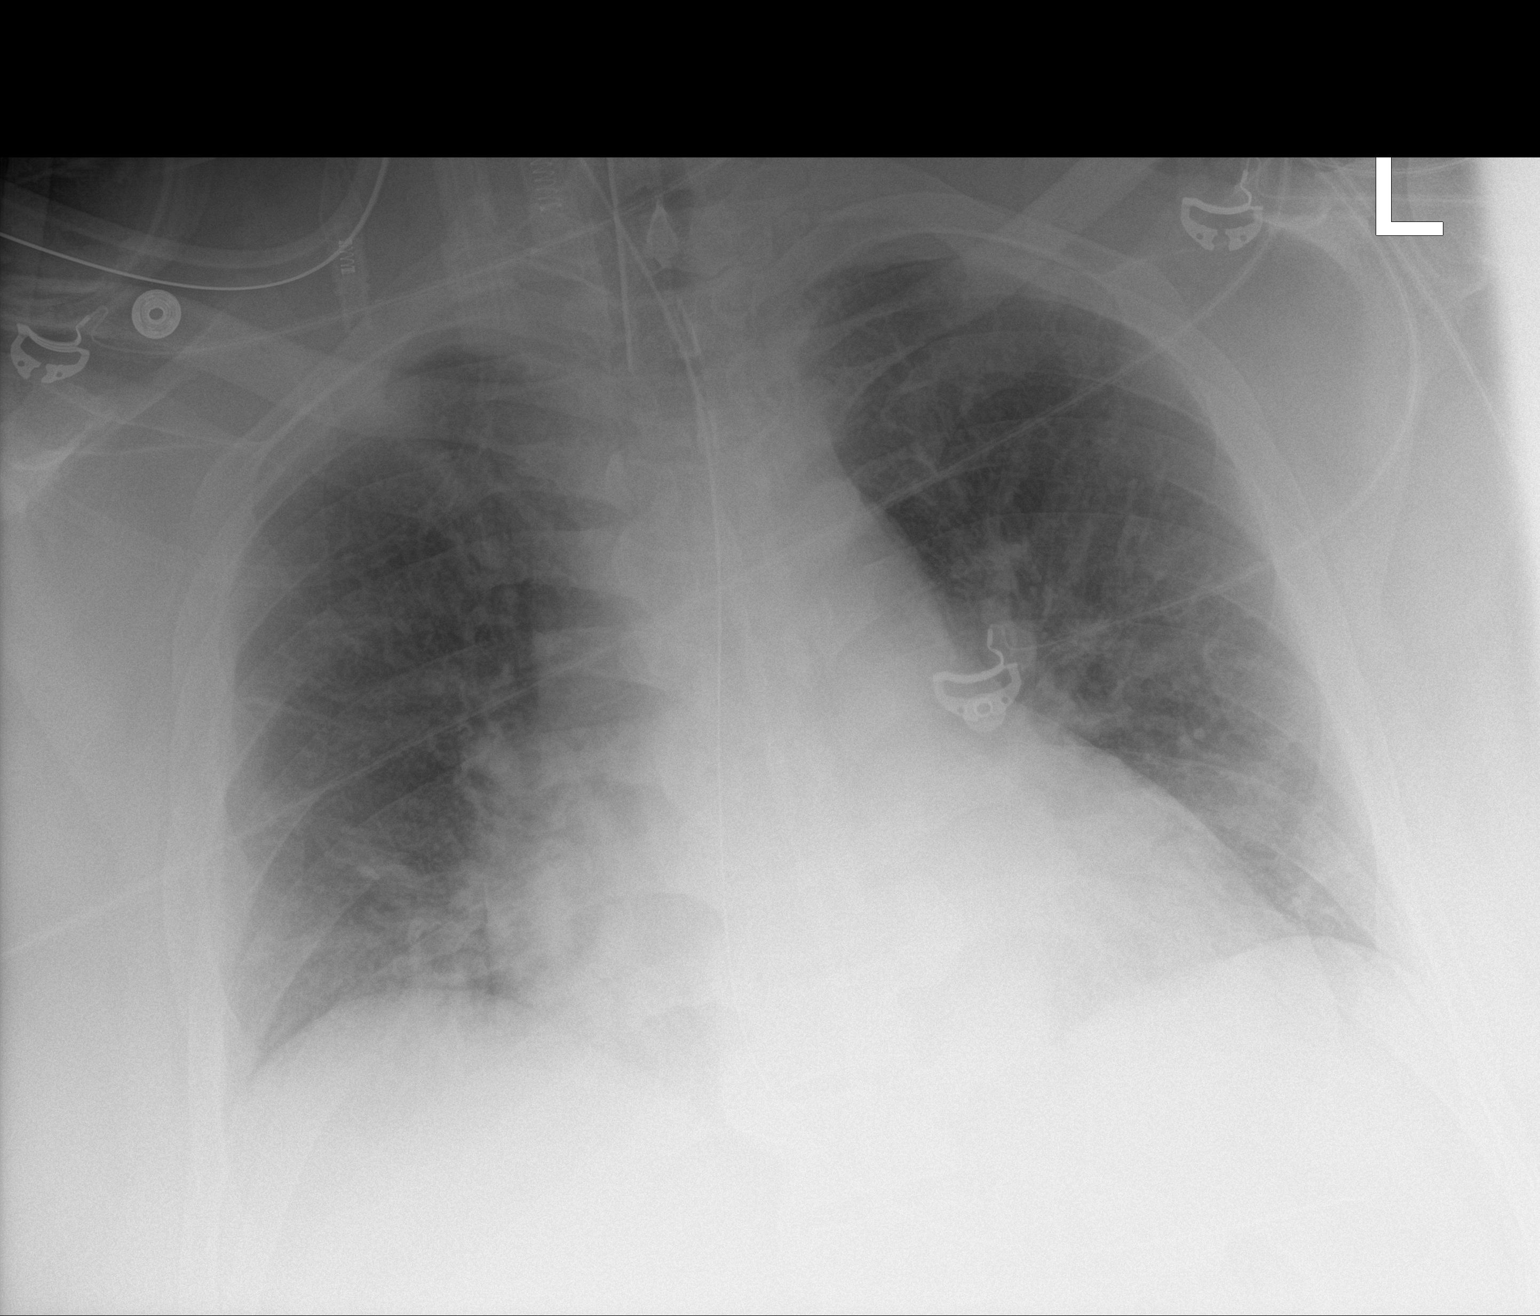

[1 of 1 positions shown; findings below may reference images not displayed]

FINDINGS: Suspected mild interstitial edema. Patchy bilateral lower lobe
opacities, likely atelectasis. No definite pleural effusion or
pneumothorax.

Cardiomegaly.

Endotracheal tube terminates at the thoracic inlet.

Enteric tube terminates in the gastric cardia.
IMPRESSION: Endotracheal tube terminates at the thoracic inlet.

Suspected mild interstitial edema. Mild patchy bilateral lower lobe
opacities, likely atelectasis.

Enteric tube terminates in the gastric cardia.

## 2018-10-15 IMAGING — DX DG CHEST 1V PORT
2 series · 2 of 2 positions shown · non-contrast
Comparison: 03/02/2017

CLINICAL DATA: Central line placement

EXAM:
PORTABLE CHEST 1 VIEW

[chest ap (1 of 2)]
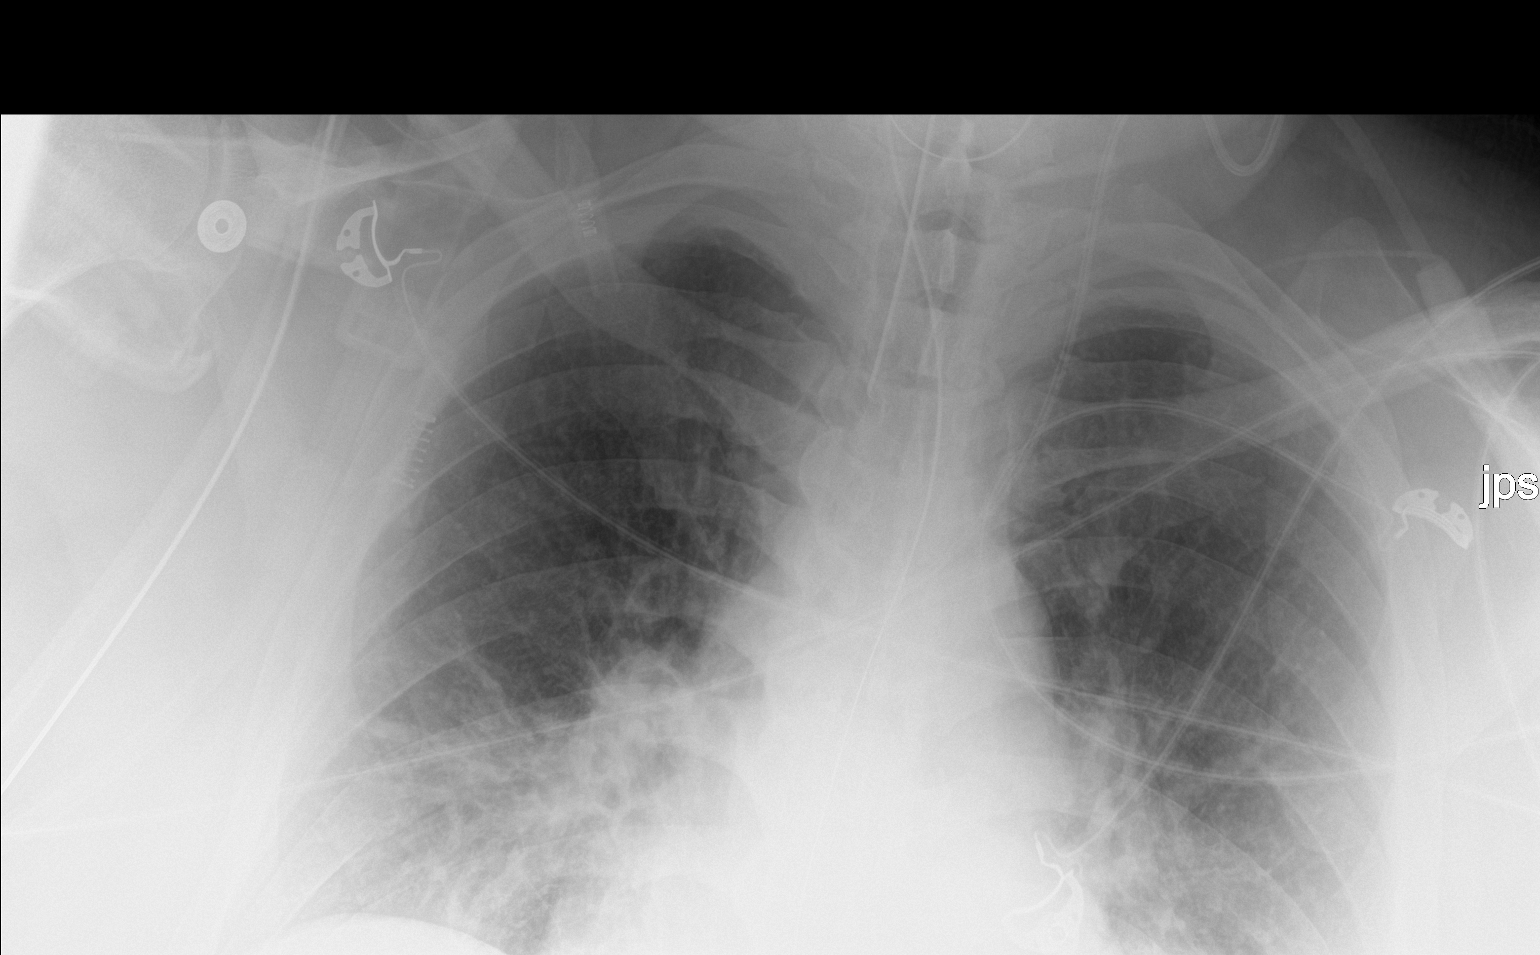

[chest ap (2 of 2)]
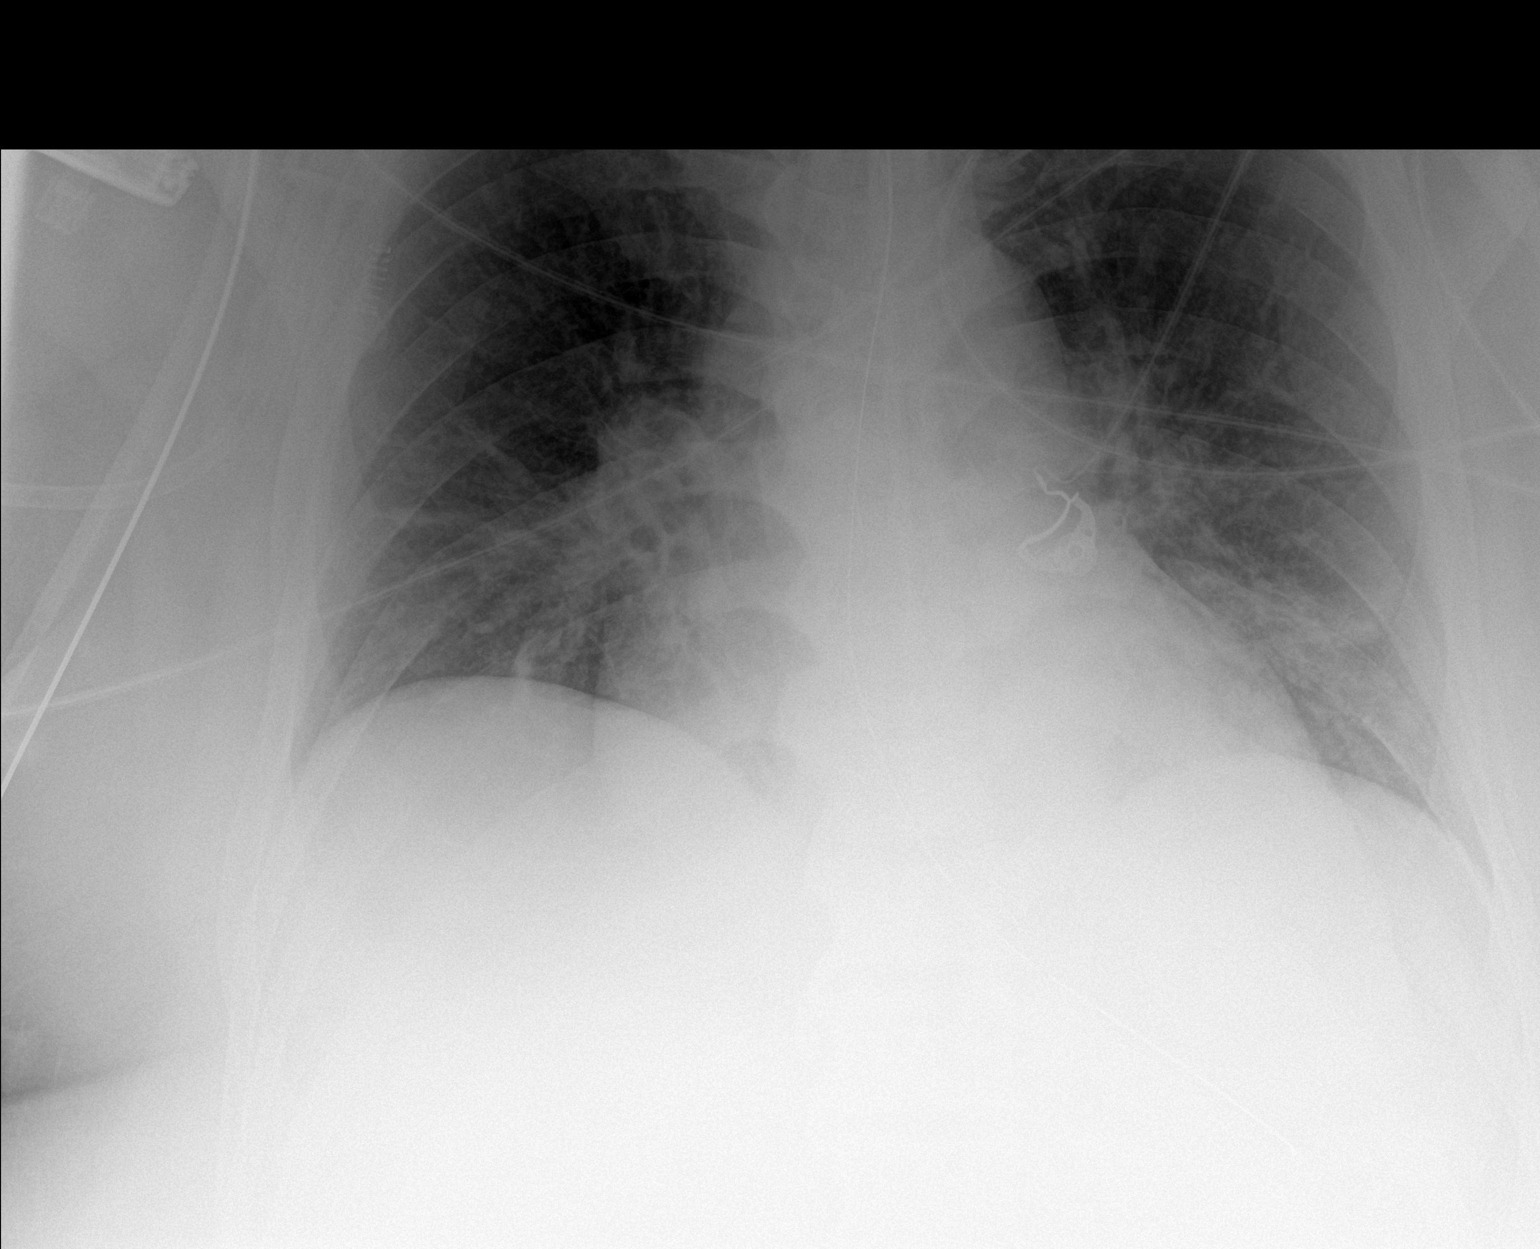

[2 of 2 positions shown; findings below may reference images not displayed]

FINDINGS: The feeding tube appears to cul well once in the cervical esophagus
or hypopharynx, and thenextend down to enter the stomach.
Endotracheal tube tip 7.6 cm above the carina, about at the thoracic
inlet. The left internal jugular central line terminates in the
upper SVC.

No visible pneumothorax. Stable appearance of cardiomegaly with
indistinct pulmonary vasculature. Mild central interstitial
accentuation. No blunting of the costophrenic angles is observed.

Thoracic spondylosis.
IMPRESSION: 1. New left IJ central venous catheter tip: SVC. No visible
pneumothorax.
2. The nasogastric tube is thought to coil once in the cervical
esophagus or hypopharynx, but terminates in the stomach.
3. Stable cardiomegaly with pulmonary venous hypertension and
borderline interstitial edema.
4. Endotracheal tube tip is essentially at the thoracic inlet.
These results will be called to the ordering clinician or
representative by the Radiologist Assistant, and communication
documented in the PACS or zVision Dashboard.

## 2018-10-18 IMAGING — CR DG CHEST 1V PORT
1 series · 1 of 1 positions shown · non-contrast
Comparison: March 04, 2017

CLINICAL DATA: Evaluate ETT

EXAM:
PORTABLE CHEST 1 VIEW

[AP]
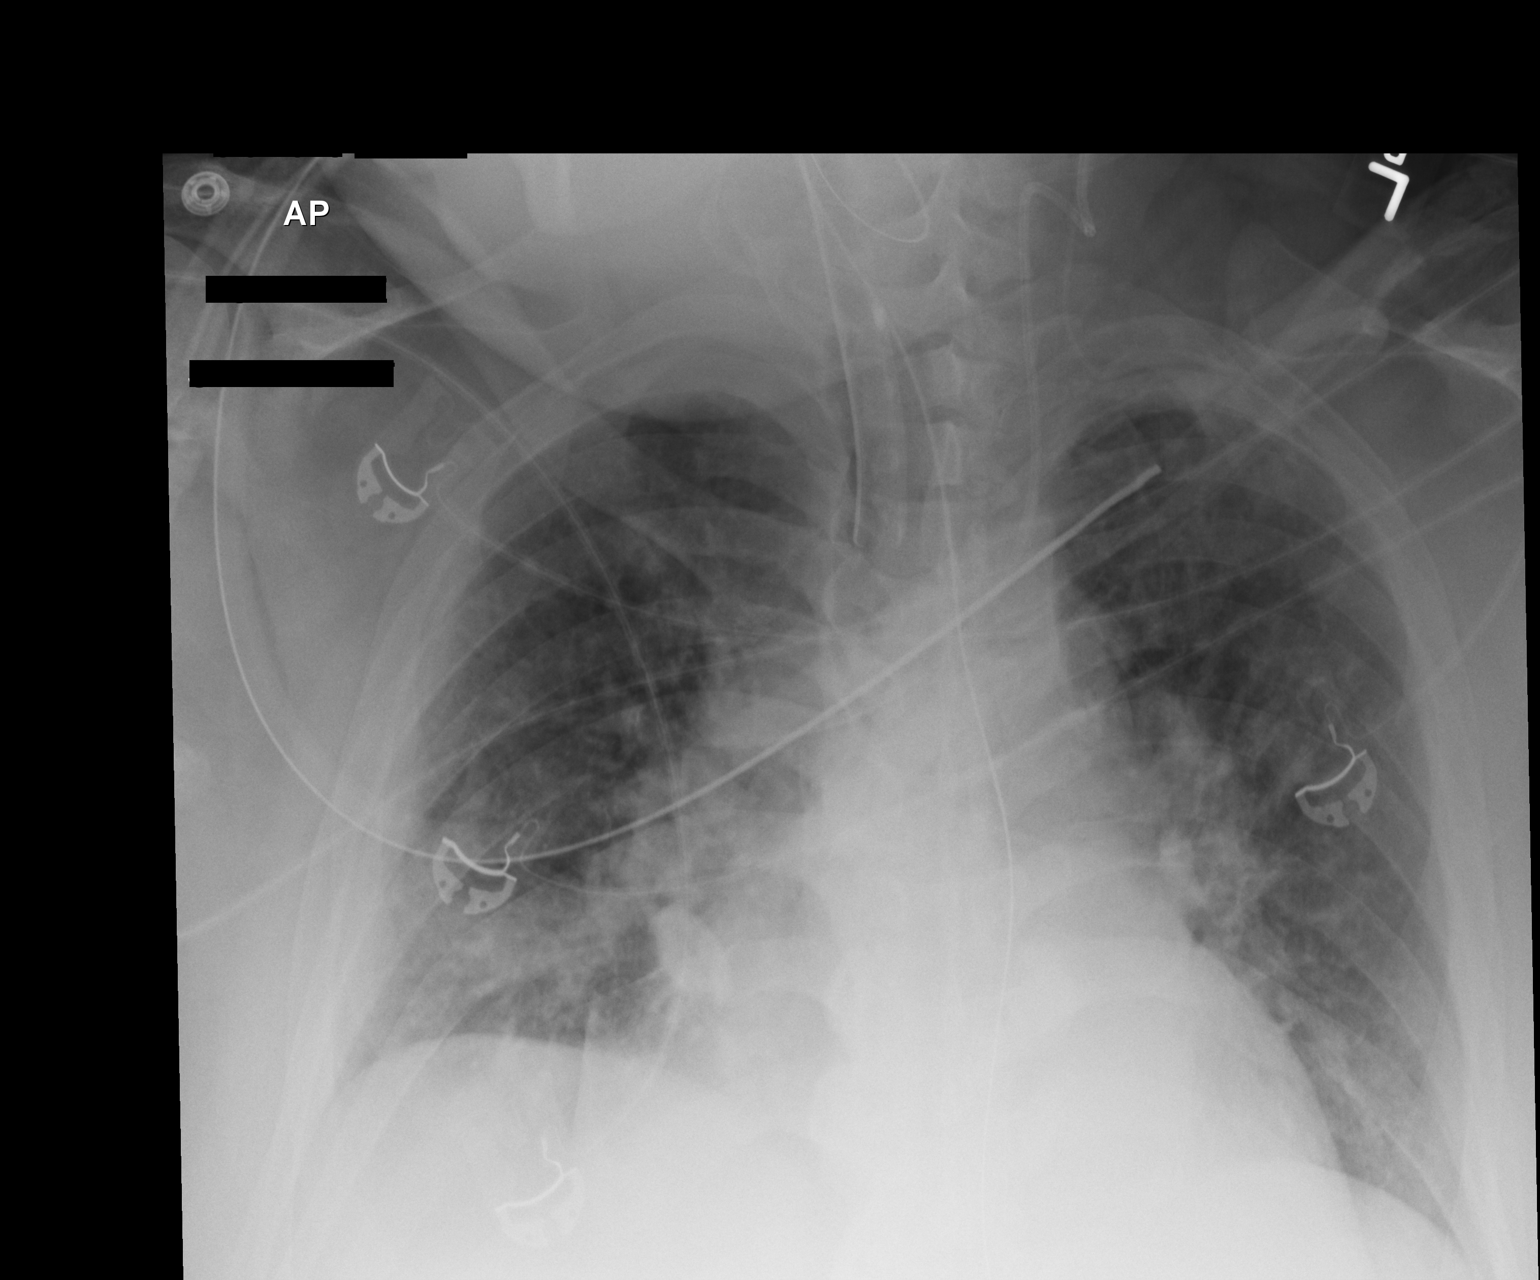

[1 of 1 positions shown; findings below may reference images not displayed]

FINDINGS: The ETT terminates in the mid trachea. The NG tube terminates below
today's film. A left-sided central line is stable. No pneumothorax.
Pulmonary edema is similar in the interval. There is a little more
focal opacity in the lower right base. Stable cardiomegaly. No other
changes.
IMPRESSION: 1. Support apparatus is in good position within visualize limits.
2. Persistent mild edema.
3. More focal opacity in the right base could represent asymmetric
edema or developing infiltrate. Recommend attention on follow-up.

## 2018-10-20 IMAGING — CR DG CHEST 1V PORT
1 series · 1 of 1 positions shown · non-contrast
Comparison: 03/06/2017 and earlier.

CLINICAL DATA: 51-year-old male presented with shortness of breath
and intubated. Acute on chronic systolic congestive heart failure,
pulmonary edema, obesity.  Intubated.

EXAM:
PORTABLE CHEST 1 VIEW

[AP]
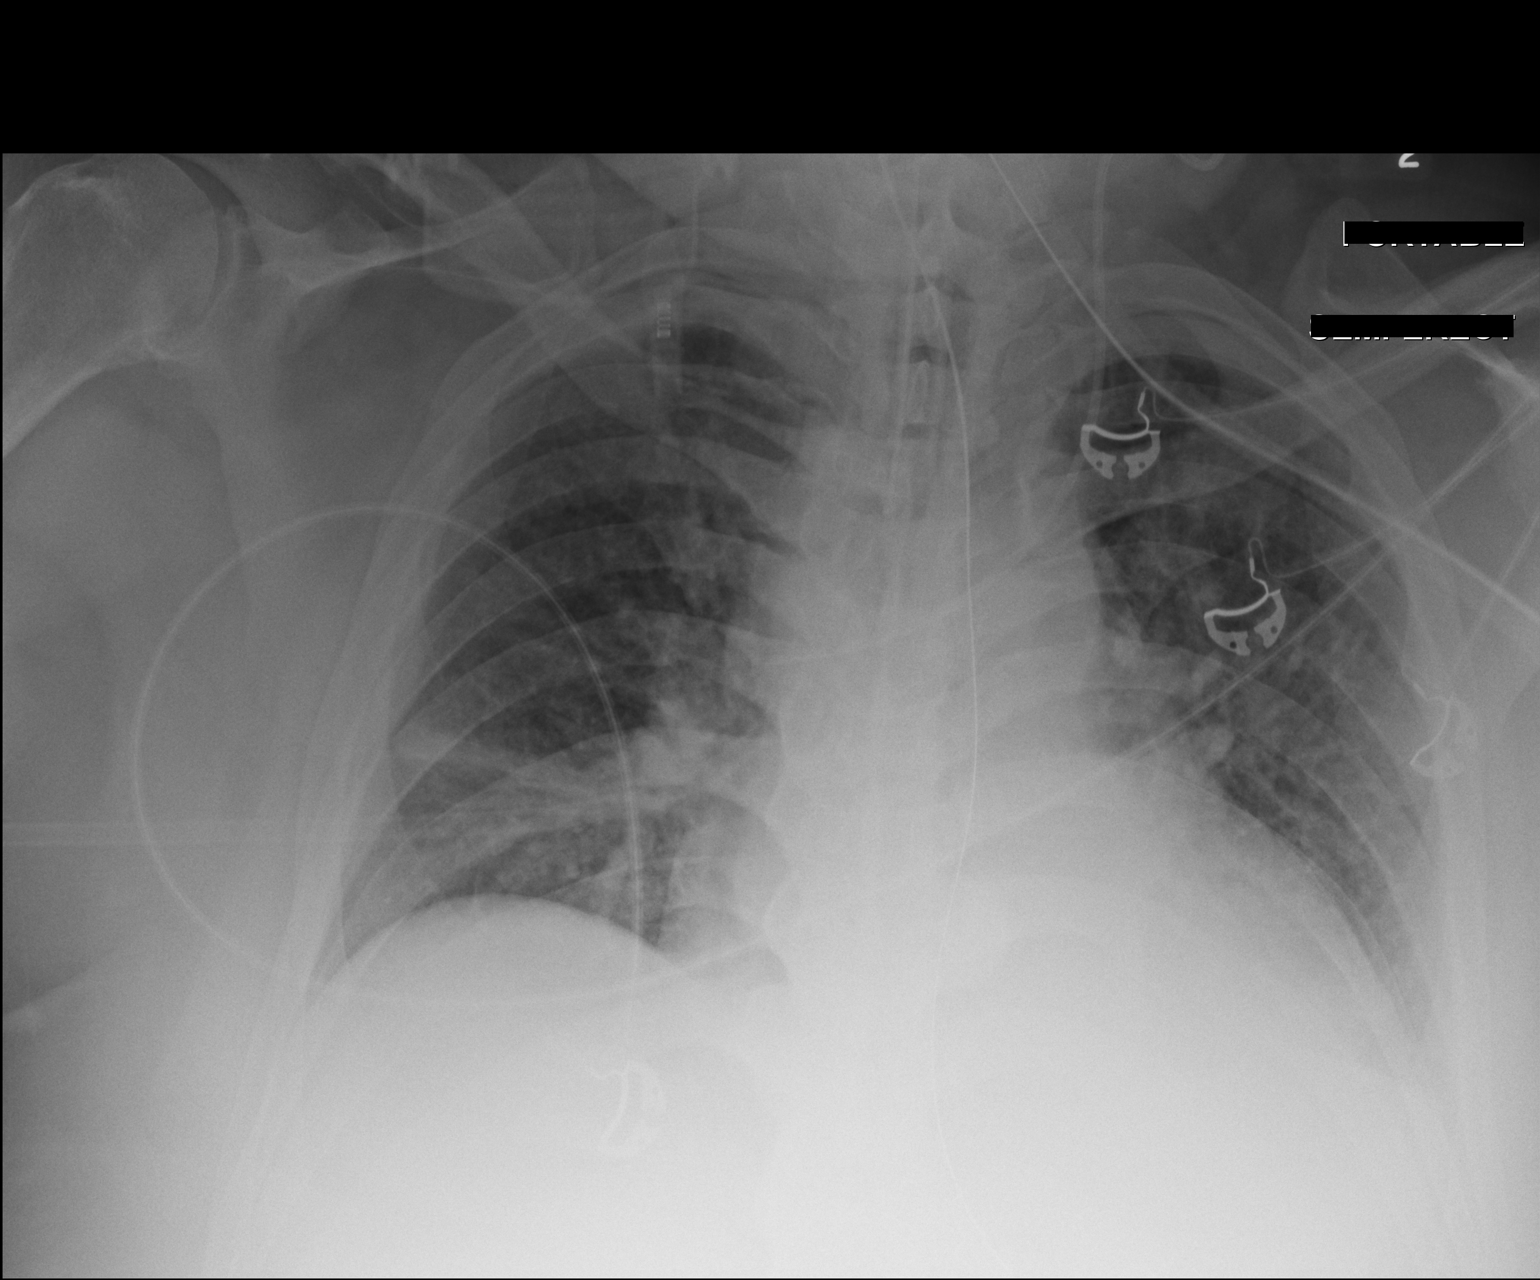

[1 of 1 positions shown; findings below may reference images not displayed]

FINDINGS: Portable AP semi upright view at [id number] hours. The patient is less
rotated. Stable endotracheal tube tip at the level the clavicles.
Enteric tube courses to the abdomen, tip not included. Stable left
IJ central line. Stable cardiac size and mediastinal contours.
Streaky opacity about the right hilum, but has regressed since
03/01/2017. Stable pulmonary vascularity without edema. No
pneumothorax. No pleural effusion.
IMPRESSION: 1.  Stable lines and tubes.
2. Mild lung base atelectasis.  No new cardiopulmonary abnormality.

## 2018-10-22 ENCOUNTER — Ambulatory Visit (INDEPENDENT_AMBULATORY_CARE_PROVIDER_SITE_OTHER): Payer: BLUE CROSS/BLUE SHIELD | Admitting: Podiatry

## 2018-10-22 ENCOUNTER — Encounter: Payer: Self-pay | Admitting: Podiatry

## 2018-10-22 DIAGNOSIS — E119 Type 2 diabetes mellitus without complications: Secondary | ICD-10-CM

## 2018-10-22 DIAGNOSIS — B351 Tinea unguium: Secondary | ICD-10-CM | POA: Diagnosis not present

## 2018-10-22 DIAGNOSIS — M79676 Pain in unspecified toe(s): Secondary | ICD-10-CM | POA: Diagnosis not present

## 2018-10-22 DIAGNOSIS — M79609 Pain in unspecified limb: Principal | ICD-10-CM

## 2018-10-22 NOTE — Progress Notes (Signed)
Complaint:  Visit Type: Patient returns to my office for continued preventative foot care services. Complaint: Patient states" my nails have grown long and thick and become painful to walk and wear shoes" Patient has been diagnosed with DM with no foot complications. The patient presents for preventative foot care services. No changes to ROS  Podiatric Exam: Vascular: dorsalis pedis and posterior tibial pulses are palpable bilateral. Capillary return is immediate. Temperature gradient is WNL. Skin turgor WNL  Sensorium: Normal Semmes Weinstein monofilament test. Normal tactile sensation bilaterally. Nail Exam: Pt has thick disfigured discolored nails with subungual debris noted bilateral entire nail hallux through fifth toenails Ulcer Exam: There is no evidence of ulcer or pre-ulcerative changes or infection. Orthopedic Exam: Muscle tone and strength are WNL. No limitations in general ROM. No crepitus or effusions noted. Foot type and digits show no abnormalities. Bony prominences are unremarkable. Skin: No Porokeratosis. No infection or ulcers  Diagnosis:  Onychomycosis, , Pain in right toe, pain in left toes  Treatment & Plan Procedures and Treatment: Consent by patient was obtained for treatment procedures.   Debridement of mycotic and hypertrophic toenails, 1 through 5 bilateral and clearing of subungual debris. No ulceration, no infection noted.  Return Visit-Office Procedure: Patient instructed to return to the office for a follow up visit 3 months for continued evaluation and treatment.    Leyton Brownlee DPM 

## 2018-12-16 ENCOUNTER — Encounter: Payer: Self-pay | Admitting: Family Medicine

## 2018-12-16 ENCOUNTER — Ambulatory Visit (INDEPENDENT_AMBULATORY_CARE_PROVIDER_SITE_OTHER): Payer: BLUE CROSS/BLUE SHIELD | Admitting: Family Medicine

## 2018-12-16 VITALS — BP 150/96 | HR 102 | Temp 98.5°F | Ht 75.0 in | Wt 375.0 lb

## 2018-12-16 DIAGNOSIS — E1159 Type 2 diabetes mellitus with other circulatory complications: Secondary | ICD-10-CM | POA: Diagnosis not present

## 2018-12-16 DIAGNOSIS — I5032 Chronic diastolic (congestive) heart failure: Secondary | ICD-10-CM

## 2018-12-16 DIAGNOSIS — E785 Hyperlipidemia, unspecified: Secondary | ICD-10-CM

## 2018-12-16 DIAGNOSIS — E1165 Type 2 diabetes mellitus with hyperglycemia: Secondary | ICD-10-CM

## 2018-12-16 DIAGNOSIS — I152 Hypertension secondary to endocrine disorders: Secondary | ICD-10-CM

## 2018-12-16 DIAGNOSIS — E1169 Type 2 diabetes mellitus with other specified complication: Secondary | ICD-10-CM

## 2018-12-16 DIAGNOSIS — Z794 Long term (current) use of insulin: Secondary | ICD-10-CM

## 2018-12-16 DIAGNOSIS — I428 Other cardiomyopathies: Secondary | ICD-10-CM

## 2018-12-16 DIAGNOSIS — I480 Paroxysmal atrial fibrillation: Secondary | ICD-10-CM

## 2018-12-16 DIAGNOSIS — Z6841 Body Mass Index (BMI) 40.0 and over, adult: Secondary | ICD-10-CM

## 2018-12-16 DIAGNOSIS — I10 Essential (primary) hypertension: Secondary | ICD-10-CM

## 2018-12-16 LAB — MICROALBUMIN / CREATININE URINE RATIO
Creatinine,U: 224.1 mg/dL
Microalb Creat Ratio: 2.3 mg/g (ref 0.0–30.0)
Microalb, Ur: 5.2 mg/dL — ABNORMAL HIGH (ref 0.0–1.9)

## 2018-12-16 LAB — POCT GLYCOSYLATED HEMOGLOBIN (HGB A1C): HEMOGLOBIN A1C: 7 % — AB (ref 4.0–5.6)

## 2018-12-16 MED ORDER — ALBUTEROL SULFATE HFA 108 (90 BASE) MCG/ACT IN AERS: 2.0000 | INHALATION_SPRAY | RESPIRATORY_TRACT | 0 refills | Status: DC | PRN

## 2018-12-16 MED ORDER — INSULIN ASPART PROT & ASPART (70-30 MIX) 100 UNIT/ML PEN: 22.0000 [IU] | PEN_INJECTOR | Freq: Two times a day (BID) | SUBCUTANEOUS | 11 refills | Status: DC

## 2018-12-16 NOTE — Assessment & Plan Note (Signed)
Stable.  Recommended cardiology follow-up soon.

## 2018-12-16 NOTE — Assessment & Plan Note (Addendum)
Last LDL 79.  Continue simvastatin 20 mg daily.

## 2018-12-16 NOTE — Progress Notes (Signed)
   Chief Complaint:  Luis [name] is a 53 y.o. male who presents today with a chief complaint of diabetes follow-up.   Assessment/Plan:  Diabetes (HCC) A1c stable at 7.0.  Continue insulin aspart 70/30 22 units twice daily.  Follow-up with me in 6 months.  Paroxysmal atrial fibrillation (HCC) Regular rate and rhythm today.  Recommended cardiology follow-up as he has not seen him for over a year.  Morbid obesity (HCC) Weight up 12 pounds since last visit.  Discussed lifestyle modifications.  Follow-up with me in 6 months.  Hypertension associated with diabetes (HCC) Above goal.  He has previously been well controlled.  Continue Toprol tartrate 50 mg twice daily.  Discussed importance of regular exercise and low-salt diet.  Recommended home blood pressure monitoring with goal 140/90 or lower.  Hyperlipidemia associated with type 2 diabetes mellitus (HCC) Last LDL 79.  Continue simvastatin 20 mg daily.  Chronic diastolic CHF (congestive heart failure) (HCC), ECHO 03/2017 EF 50-55%, on Torsemide No signs of volume overload.  Continue torsemide as needed.  Recommended cardiology follow-up soon.  NICM (nonischemic cardiomyopathy) (HCC), left heart cath and coronary angiography 2017 Stable.  Recommended cardiology follow-up soon.     Subjective:  HPI:  His stable, chronic medical conditions are outlined below:  #Diabetes - On insulin aspart 70/30 22 units twice daily and tolerating well. - Home sugars usually in the 100s.  No symptomatic lows. - ROS: No polyuria or polydipsia  #Dyslipidemia -On simvastatin 10 mg daily and tolerating well. -ROS: No myalgias.  # Essential Hypertension -On metoprolol tartrate 50 mg twice daily and tolerating well. -ROS: No chest pain or shortness of breath.  % CHF/ NICM / PAF - Follows with cardiology. - Takes Torsemide as needed for leg swelling.   ROS: Per HPI  PMH: He reports that he quit smoking about 9 years ago. His smoking use  included cigarettes. He has a 10.00 pack-year smoking history. He has never used smokeless tobacco. He reports that he does not drink alcohol or use drugs.      Objective:  Physical Exam: BP (!) 150/96 (BP Location: Left Arm, Patient Position: Sitting, Cuff Size: Large)   Pulse (!) 102   Temp 98.5 F (36.9 C) (Oral)   Ht 6\' 3"  (1.905 m)   Wt (!) 375 lb (170.1 kg)   SpO2 96%   BMI 46.87 kg/m   Wt Readings from Last 3 Encounters:  12/16/18 (!) 375 lb (170.1 kg)  06/10/18 (!) 363 lb (164.7 kg)  02/18/18 (!) 356 lb (161.5 kg)  Gen: NAD, resting comfortably CV: Regular rate and rhythm with no murmurs appreciated Pulm: Normal work of breathing, clear to auscultation bilaterally with no crackles, wheezes, or rhonchi MSK: Trace pretibial edema bilaterally.   Results for orders placed or performed in visit on 12/16/18 (from the past 24 hour(s))  POCT HgB A1C     Status: Abnormal   Collection Time: 12/16/18  9:42 AM  Result Value Ref Range   Hemoglobin A1C 7.0 (A) 4.0 - 5.6 %         Caleb M. [name], MD 12/16/2018 11:22 AM

## 2018-12-16 NOTE — Patient Instructions (Signed)
It was very nice to see you today!  I am very happy with how your sugar numbers are.  I will refill your insulin pen.  I will also refill your albuterol.  No medication changes today.  Please keep a close eye on your blood pressure in the next 2 weeks and let me know if persistently 140/90 or higher.  Come back to see me in 6 months or sooner as needed.  Please schedule appointment to see your heart doctor soon.  Take care, Dr [name]

## 2018-12-16 NOTE — Assessment & Plan Note (Signed)
Weight up 12 pounds since last visit.  Discussed lifestyle modifications.  Follow-up with me in 6 months.

## 2018-12-16 NOTE — Assessment & Plan Note (Signed)
Regular rate and rhythm today.  Recommended cardiology follow-up as he has not seen him for over a year.

## 2018-12-16 NOTE — Assessment & Plan Note (Signed)
No signs of volume overload.  Continue torsemide as needed.  Recommended cardiology follow-up soon.

## 2018-12-16 NOTE — Assessment & Plan Note (Signed)
Above goal.  He has previously been well controlled.  Continue Toprol tartrate 50 mg twice daily.  Discussed importance of regular exercise and low-salt diet.  Recommended home blood pressure monitoring with goal 140/90 or lower.

## 2018-12-16 NOTE — Assessment & Plan Note (Signed)
A1c stable at 7.0.  Continue insulin aspart 70/30 22 units twice daily.  Follow-up with me in 6 months.

## 2018-12-17 NOTE — Progress Notes (Signed)
Please inform patient of the following:  His urine test is NORMAL. Do not need to do any further testing at this point.  [name]. [name], MD 12/17/2018 12:58 PM

## 2018-12-23 ENCOUNTER — Telehealth: Payer: Self-pay | Admitting: Family Medicine

## 2018-12-23 NOTE — Telephone Encounter (Signed)
Pt called back in to follow up stating that he received 2 more calls. Advised pt per note in chart. Pt is grateful.   No call back is needed.

## 2018-12-23 NOTE — Telephone Encounter (Signed)
See note  Copied from CRM [id number]. Topic: General - Call Back - No Documentation >> Dec 23, 2018 11:32 AM Marylen Ponto wrote: Reason for CRM: Pt stated he was just returning the missed call.

## 2018-12-23 NOTE — Telephone Encounter (Signed)
noted 

## 2019-01-04 ENCOUNTER — Encounter: Payer: Self-pay | Admitting: Physician Assistant

## 2019-01-12 ENCOUNTER — Other Ambulatory Visit: Payer: Self-pay | Admitting: Family Medicine

## 2019-01-12 DIAGNOSIS — E782 Mixed hyperlipidemia: Secondary | ICD-10-CM

## 2019-01-18 ENCOUNTER — Telehealth: Payer: Self-pay | Admitting: Cardiovascular Disease

## 2019-01-18 NOTE — Telephone Encounter (Signed)
Attempted to contact pt in regards to appt on 3/19 and COVID screening.    No answer and no VM.

## 2019-01-19 NOTE — Telephone Encounter (Signed)
Spoke with the pt and reports that he has been feeling well and he is wanting to reschedule his appt for tomorrow with Veto Kemps has been following up with his PMD Dr. Jacquiline Doe... appt changed to 03/03/19. Pt to call if he needs anything prior to that appt.

## 2019-01-20 ENCOUNTER — Ambulatory Visit: Payer: BLUE CROSS/BLUE SHIELD | Admitting: Physician Assistant

## 2019-01-21 ENCOUNTER — Encounter: Payer: Self-pay | Admitting: Podiatry

## 2019-01-21 ENCOUNTER — Other Ambulatory Visit: Payer: Self-pay

## 2019-01-21 ENCOUNTER — Ambulatory Visit (INDEPENDENT_AMBULATORY_CARE_PROVIDER_SITE_OTHER): Payer: BLUE CROSS/BLUE SHIELD | Admitting: Podiatry

## 2019-01-21 DIAGNOSIS — B351 Tinea unguium: Secondary | ICD-10-CM | POA: Diagnosis not present

## 2019-01-21 DIAGNOSIS — M79609 Pain in unspecified limb: Principal | ICD-10-CM

## 2019-01-21 DIAGNOSIS — E119 Type 2 diabetes mellitus without complications: Secondary | ICD-10-CM | POA: Diagnosis not present

## 2019-01-21 DIAGNOSIS — M79676 Pain in unspecified toe(s): Secondary | ICD-10-CM

## 2019-01-21 NOTE — Progress Notes (Signed)
Complaint:  Visit Type: Patient returns to my office for continued preventative foot care services. Complaint: Patient states" my nails have grown long and thick and become painful to walk and wear shoes" Patient has been diagnosed with DM with no foot complications. The patient presents for preventative foot care services. No changes to ROS  Podiatric Exam: Vascular: dorsalis pedis and posterior tibial pulses are palpable bilateral. Capillary return is immediate. Temperature gradient is WNL. Skin turgor WNL  Sensorium: Normal Semmes Weinstein monofilament test. Normal tactile sensation bilaterally. Nail Exam: Pt has thick disfigured discolored nails with subungual debris noted bilateral entire nail hallux through fifth toenails Ulcer Exam: There is no evidence of ulcer or pre-ulcerative changes or infection. Orthopedic Exam: Muscle tone and strength are WNL. No limitations in general ROM. No crepitus or effusions noted. Foot type and digits show no abnormalities. Bony prominences are unremarkable. Skin: No Porokeratosis. No infection or ulcers  Diagnosis:  Onychomycosis, , Pain in right toe, pain in left toes  Treatment & Plan Procedures and Treatment: Consent by patient was obtained for treatment procedures.   Debridement of mycotic and hypertrophic toenails, 1 through 5 bilateral and clearing of subungual debris. No ulceration, no infection noted.  Return Visit-Office Procedure: Patient instructed to return to the office for a follow up visit 3 months for continued evaluation and treatment.    Rosland Riding DPM 

## 2019-02-15 ENCOUNTER — Telehealth: Payer: Self-pay | Admitting: Physician Assistant

## 2019-02-15 NOTE — Telephone Encounter (Signed)
    Chart Scrub Note  Luis [name] is currently scheduled for in-person visit on 03/03/19. Due to the recent COVID-19 pandemic, we are transitioning in-person office visits to tele-medicine visits in an effort to decrease non-essential exposure to our patients, their families, and staff. This patient has been deemed a candidate to change their previously scheduled office visit to virtual visit.   Please call patient:  1. Inform them we will be conducting visit via tele-health, instead of coming to the office physically in person because his medical history puts him at increased risk. 2. Please discuss doing telephone versus video visit. If the patient has a smartphone, video visit is preferred method. Note:  I will be using Doximity video dialer instead of Webex. There is no prep needed on the patient's side. This merely involves the patient receiving a text at the time of their visit to launch the video (on a smartphone). The workflow would be for my assist to call the patient on the phone 15 mins prior to visit to get vitals and meds, then I will send the patient a text via mobile phone to launch the video. If the patient decides to proceed with video visit, please let him know the plan. 3. When contact is confirmed, please change appointment type to Virtual Office Visit and indicate appointment type in notes. 4. Can review consent at time of this phone call when contact is established or 2-3 days prior to visit (dotphrase is hcevisitprecall). 5. It would be really helpful if he has a blood pressure cuff to check his BP for several days prior to the visit and relay those values.  If the patient has any urgent concerns, please send me back any pertinent information and we will decide whether we need to transition them to the doctor-of-the-day's schedule in person.  Thank you!   Dayna Dunn PA-C

## 2019-02-15 NOTE — Telephone Encounter (Signed)
Patient set up for MyChart?  No patient does not want  Is patient using Smartphone/computer/tablet? no  Did audio/video workn/a  Does patient need telephone visit?yes  Best phone number to use? [phone]  Special Instructions? Patient only has landline phone , please call at home . Patient gave verbal consent for phone visit    TELEPHONE CALL NOTE  This patient has been deemed a candidate for follow-up tele-health visit to limit community exposure during the Covid-19 pandemic. I spoke with the patient via phone to discuss instructions. This has been outlined on the patient's AVS (dotphrase: hcevisitinfo). The patient was advised to review the section on consent for treatment as well. The patient will receive a phone call 2-3 days prior to their E-Visit at which time consent will be verbally confirmed. A Virtual Office Visit appointment type has been scheduled for 03/03/2019  with Ronie Spies, with "VIDEO" or "TELEPHONE" in the appointment notes - patient prefers phone type.  I have either confirmed the patient is active in MyChart or offered to send sign-up link to phone/email via Mychart icon beside patient's photo.Declined - Patient gave consent for virtual visit 02/15/2019  Berle Mull 02/15/2019 2:45 PM

## 2019-02-15 NOTE — Telephone Encounter (Signed)
Virtual Visit Pre-Appointment Phone Call  Steps For Call:  1. Confirm consent - "In the setting of the current Covid19 crisis, you are scheduled for a (phone or video) visit with your provider on (date) at (time).  Just as we do with many in-office visits, in order for you to participate in this visit, we must obtain consent.  If you'd like, I can send this to your mychart (if signed up) or email for you to review.  Otherwise, I can obtain your verbal consent now.  All virtual visits are billed to your insurance company just like a normal visit would be.  By agreeing to a virtual visit, we'd like you to understand that the technology does not allow for your provider to perform an examination, and thus may limit your provider's ability to fully assess your condition.  Finally, though the technology is pretty good, we cannot assure that it will always work on either your or our end, and in the setting of a video visit, we may have to convert it to a phone-only visit.  In either situation, we cannot ensure that we have a secure connection.  Are you willing to proceed?" STAFF: Did the patient verbally acknowledge consent to telehealth visit? yesDocument YES/NOyes  2. Confirm the BEST phone number to call the day of the visit: [phone]  3. Give patient instructions for WebEx/MyChart download to smartphone as below or Doximity/Doxy.me if video visit (depending on what platform provider is using)  4. Advise patient to be prepared with any vital sign or heart rhythm information, their current medicines, and a piece of paper and pen handy for any instructions they may receive the day of their visit  5. Inform patient they will receive a phone call 15 minutes prior to their appointment time (may be from unknown caller ID) so they should be prepared to answer  6. Confirm that appointment type is correct in Epic appointment notes (video vs telephone)     TELEPHONE CALL NOTE  Luis [name]  has been deemed a candidate for a follow-up tele-health visit to limit community exposure during the Covid-19 pandemic. I spoke with the patient via phone to ensure availability of phone/video source, confirm preferred email & phone number, and discuss instructions and expectations.  I reminded Luis [name] to be prepared with any vital sign and/or heart rhythm information that could potentially be obtained via home monitoring, at the time of his visit. I reminded Luis [name] to expect a phone call at the time of his visit if his visit.  Luis [name], RMA 02/15/2019 2:54 PM   DOWNLOADING THE WEBEX APP TO SMARTPHONE  - If Apple, ask patient to go to App Store and type in WebEx in the search bar. Download [employer], the blue/green circle. If Android, go to [employer] and type in [employer] in the search bar. The app is free but as with any other app downloads, their phone may require them to verify saved payment information or Apple/Android password.  - The patient does NOT have to create an account. - On the day of the visit, the assist will walk the patient through joining the meeting with the meeting number/password.  DOWNLOADING THE MYCHART APP TO SMARTPHONE  - If Apple, go to [employer] and type in MyChart in the search bar and download the app. If Android, ask patient to go to [employer] and type in [place] in the search bar and download  the app. The app is free but as with any other app downloads, their phone may require them to verify saved payment information or Apple/Android password.  - The patient will need to then log into the app with their MyChart username and password, and select [facility] as their healthcare provider to link the account. When it is time for your visit, go to the MyChart app, find appointments, and click Begin Video Visit. Be sure to Select Allow for your device to access the Microphone and Camera for your visit. You will then  be connected, and your provider will be with you shortly.  **If they have any issues connecting, or need assistance please contact MyChart service desk [phone])**  **If using a computer, in order to ensure the best quality for your visit they will need to use either of the following Internet Browsers: Microsoft [place], or Google Chrome**  FULL LENGTH CONSENT FOR TELE-HEALTH VISIT   I hereby voluntarily request, consent and authorize [facility] and its employed or contracted physicians, physician assistants, nurse practitioners or other licensed health care professionals (the Practitioner), to provide me with telemedicine health care services (the Services") as deemed necessary by the treating Practitioner. I acknowledge and consent to receive the Services by the Practitioner via telemedicine. I understand that the telemedicine visit will involve communicating with the Practitioner through live audiovisual communication technology and the disclosure of certain medical information by electronic transmission. I acknowledge that I have been given the opportunity to request an in-person assessment or other available alternative prior to the telemedicine visit and am voluntarily participating in the telemedicine visit.  I understand that I have the right to withhold or withdraw my consent to the use of telemedicine in the course of my care at any time, without affecting my right to future care or treatment, and that the Practitioner or I may terminate the telemedicine visit at any time. I understand that I have the right to inspect all information obtained and/or recorded in the course of the telemedicine visit and may receive copies of available information for a reasonable fee.  I understand that some of the potential risks of receiving the Services via telemedicine include:   Delay or interruption in medical evaluation due to technological equipment failure or disruption;  Information  transmitted may not be sufficient (e.g. poor resolution of images) to allow for appropriate medical decision making by the Practitioner; and/or   In rare instances, security protocols could fail, causing a breach of personal health information.  Furthermore, I acknowledge that it is my responsibility to provide information about my medical history, conditions and care that is complete and accurate to the best of my ability. I acknowledge that Practitioner's advice, recommendations, and/or decision may be based on factors not within their control, such as incomplete or inaccurate data provided by me or distortions of diagnostic images or specimens that may result from electronic transmissions. I understand that the practice of medicine is not an exact science and that Practitioner makes no warranties or guarantees regarding treatment outcomes. I acknowledge that I will receive a copy of this consent concurrently upon execution via email to the email address I last provided but may also request a printed copy by calling the office of [facility].    I understand that my insurance will be billed for this visit.   I have read or had this consent read to me.  I understand the contents of this consent, which adequately explains the benefits  and risks of the Services being provided via telemedicine.   I have been provided ample opportunity to ask questions regarding this consent and the Services and have had my questions answered to my satisfaction.  I give my informed consent for the services to be provided through the use of telemedicine in my medical care  By participating in this telemedicine visit I agree to the above.

## 2019-03-02 ENCOUNTER — Encounter: Payer: Self-pay | Admitting: Physician Assistant

## 2019-03-02 NOTE — Progress Notes (Deleted)
Patient no-showed for virtual appt despite attempts to contact even after appt time. Dayna Dunn PA-C

## 2019-03-03 ENCOUNTER — Other Ambulatory Visit: Payer: Self-pay

## 2019-03-03 ENCOUNTER — Telehealth: Payer: BLUE CROSS/BLUE SHIELD | Admitting: Physician Assistant

## 2019-03-03 NOTE — Progress Notes (Signed)
Patient no-showed for virtual visit despite attempt to contact after appointment time had passed. Yanelie Abraha PA-C

## 2019-03-18 DIAGNOSIS — K6289 Other specified diseases of anus and rectum: Secondary | ICD-10-CM | POA: Diagnosis not present

## 2019-04-13 ENCOUNTER — Telehealth: Payer: Self-pay | Admitting: Family Medicine

## 2019-04-13 ENCOUNTER — Other Ambulatory Visit: Payer: Self-pay

## 2019-04-13 MED ORDER — INSULIN PEN NEEDLE 31G X 8 MM MISC: 11 refills | Status: AC

## 2019-04-13 NOTE — Telephone Encounter (Signed)
See request °

## 2019-04-13 NOTE — Telephone Encounter (Signed)
Rx sent to pharmacy   

## 2019-04-13 NOTE — Telephone Encounter (Signed)
Copied from [place] [id number]. Topic: Quick Communication - Rx Refill/Question >> Apr 13, 2019 12:24 PM Leward Quan A wrote: Medication: Insulin Pen Needle 31G X 8 MM MISC   Has the patient contacted their pharmacy? Yes.   (Agent: If no, request that the patient contact the pharmacy for the refill.) (Agent: If yes, when and what did the pharmacy advise?)  Preferred Pharmacy (with phone number or street name): [facility], [place] [phone] (Phone) [phone] (Fax)    Agent: Please be advised that RX refills may take up to 3 business days. We ask that you follow-up with your pharmacy.

## 2019-04-29 ENCOUNTER — Other Ambulatory Visit: Payer: Self-pay

## 2019-04-29 ENCOUNTER — Ambulatory Visit (INDEPENDENT_AMBULATORY_CARE_PROVIDER_SITE_OTHER): Payer: BC Managed Care – PPO | Admitting: Podiatry

## 2019-04-29 ENCOUNTER — Encounter: Payer: Self-pay | Admitting: Podiatry

## 2019-04-29 DIAGNOSIS — B351 Tinea unguium: Secondary | ICD-10-CM

## 2019-04-29 DIAGNOSIS — M79674 Pain in right toe(s): Secondary | ICD-10-CM

## 2019-04-29 DIAGNOSIS — Z794 Long term (current) use of insulin: Secondary | ICD-10-CM | POA: Diagnosis not present

## 2019-04-29 DIAGNOSIS — E1165 Type 2 diabetes mellitus with hyperglycemia: Secondary | ICD-10-CM | POA: Diagnosis not present

## 2019-04-29 DIAGNOSIS — M79675 Pain in left toe(s): Secondary | ICD-10-CM | POA: Diagnosis not present

## 2019-04-29 NOTE — Progress Notes (Signed)
Complaint:  Visit Type: Patient returns to my office for continued preventative foot care services. Complaint: Patient states" my nails have grown long and thick and become painful to walk and wear shoes" Patient has been diagnosed with DM with no foot complications. The patient presents for preventative foot care services. No changes to ROS  Podiatric Exam: Vascular: dorsalis pedis and posterior tibial pulses are palpable bilateral. Capillary return is immediate. Temperature gradient is WNL. Skin turgor WNL  Sensorium: Normal Semmes Weinstein monofilament test. Normal tactile sensation bilaterally. Nail Exam: Pt has thick disfigured discolored nails with subungual debris noted bilateral entire nail hallux through fifth toenails Ulcer Exam: There is no evidence of ulcer or pre-ulcerative changes or infection. Orthopedic Exam: Muscle tone and strength are WNL. No limitations in general ROM. No crepitus or effusions noted. Foot type and digits show no abnormalities. Bony prominences are unremarkable. Skin: No Porokeratosis. No infection or ulcers  Diagnosis:  Onychomycosis, , Pain in right toe, pain in left toes  Treatment & Plan Procedures and Treatment: Consent by patient was obtained for treatment procedures.   Debridement of mycotic and hypertrophic toenails, 1 through 5 bilateral and clearing of subungual debris. No ulceration, no infection noted.  Return Visit-Office Procedure: Patient instructed to return to the office for a follow up visit 3 months for continued evaluation and treatment.    Ivyanna Sibert DPM 

## 2019-05-26 ENCOUNTER — Other Ambulatory Visit: Payer: Self-pay | Admitting: Family Medicine

## 2019-05-28 ENCOUNTER — Other Ambulatory Visit: Payer: Self-pay | Admitting: Family Medicine

## 2019-05-28 DIAGNOSIS — E782 Mixed hyperlipidemia: Secondary | ICD-10-CM

## 2019-06-09 ENCOUNTER — Other Ambulatory Visit: Payer: Self-pay | Admitting: Family Medicine

## 2019-06-09 DIAGNOSIS — I509 Heart failure, unspecified: Secondary | ICD-10-CM

## 2019-06-16 ENCOUNTER — Ambulatory Visit (INDEPENDENT_AMBULATORY_CARE_PROVIDER_SITE_OTHER): Payer: BC Managed Care – PPO | Admitting: Family Medicine

## 2019-06-16 ENCOUNTER — Other Ambulatory Visit: Payer: Self-pay

## 2019-06-16 ENCOUNTER — Encounter: Payer: Self-pay | Admitting: Family Medicine

## 2019-06-16 VITALS — BP 140/86 | HR 84 | Temp 98.6°F | Ht 75.0 in | Wt 384.0 lb

## 2019-06-16 DIAGNOSIS — E1165 Type 2 diabetes mellitus with hyperglycemia: Secondary | ICD-10-CM | POA: Diagnosis not present

## 2019-06-16 DIAGNOSIS — E1169 Type 2 diabetes mellitus with other specified complication: Secondary | ICD-10-CM | POA: Diagnosis not present

## 2019-06-16 DIAGNOSIS — I5032 Chronic diastolic (congestive) heart failure: Secondary | ICD-10-CM

## 2019-06-16 DIAGNOSIS — E785 Hyperlipidemia, unspecified: Secondary | ICD-10-CM

## 2019-06-16 DIAGNOSIS — Z794 Long term (current) use of insulin: Secondary | ICD-10-CM | POA: Diagnosis not present

## 2019-06-16 DIAGNOSIS — Z6841 Body Mass Index (BMI) 40.0 and over, adult: Secondary | ICD-10-CM

## 2019-06-16 DIAGNOSIS — I10 Essential (primary) hypertension: Secondary | ICD-10-CM

## 2019-06-16 DIAGNOSIS — E1159 Type 2 diabetes mellitus with other circulatory complications: Secondary | ICD-10-CM

## 2019-06-16 DIAGNOSIS — I152 Hypertension secondary to endocrine disorders: Secondary | ICD-10-CM

## 2019-06-16 LAB — CBC
HCT: 45.3 % (ref 39.0–52.0)
Hemoglobin: 14.8 g/dL (ref 13.0–17.0)
MCHC: 32.8 g/dL (ref 30.0–36.0)
MCV: 94.6 fl (ref 78.0–100.0)
Platelets: 247 10*3/uL (ref 150.0–400.0)
RBC: 4.79 Mil/uL (ref 4.22–5.81)
RDW: 14.4 % (ref 11.5–15.5)
WBC: 7.2 10*3/uL (ref 4.0–10.5)

## 2019-06-16 LAB — COMPREHENSIVE METABOLIC PANEL
ALT: 17 U/L (ref 0–53)
AST: 16 U/L (ref 0–37)
Albumin: 4.1 g/dL (ref 3.5–5.2)
Alkaline Phosphatase: 135 U/L — ABNORMAL HIGH (ref 39–117)
BUN: 14 mg/dL (ref 6–23)
CO2: 26 mEq/L (ref 19–32)
Calcium: 8.9 mg/dL (ref 8.4–10.5)
Chloride: 104 mEq/L (ref 96–112)
Creatinine, Ser: 1.16 mg/dL (ref 0.40–1.50)
GFR: 80.21 mL/min (ref >60.00)
Glucose, Bld: 174 mg/dL — ABNORMAL HIGH (ref 70–99)
Potassium: 3.8 mEq/L (ref 3.5–5.1)
Sodium: 140 mEq/L (ref 135–145)
Total Bilirubin: 0.5 mg/dL (ref 0.2–1.2)
Total Protein: 7.4 g/dL (ref 6.0–8.3)

## 2019-06-16 LAB — TSH: TSH: 1.39 u[IU]/mL (ref 0.35–4.50)

## 2019-06-16 LAB — LIPID PANEL
Cholesterol: 141 mg/dL (ref 0–200)
HDL: 29.8 mg/dL — ABNORMAL LOW (ref >39.00)
LDL Cholesterol: 92 mg/dL (ref 0–99)
NonHDL: 111.51
Total CHOL/HDL Ratio: 5
Triglycerides: 97 mg/dL (ref 0.0–149.0)
VLDL: 19.4 mg/dL (ref 0.0–40.0)

## 2019-06-16 LAB — HEMOGLOBIN A1C: Hgb A1c MFr Bld: 8.3 % — ABNORMAL HIGH (ref 4.6–6.5)

## 2019-06-16 NOTE — Assessment & Plan Note (Signed)
Check lipid panel. Continue simvastatin 20 mg daily. 

## 2019-06-16 NOTE — Progress Notes (Signed)
   Chief Complaint:  Luis [name] is a 53 y.o. male who presents today with a chief complaint of T2DM follow up.   Assessment/Plan:  Hypertension associated with diabetes ([place]) At goal.  Continue metoprolol tartrate 50 mg twice daily.  He is looking into starting on CPAP which I think will help also.  Discussed importance of regular exercise and low-salt diet.  Diabetes (HCC) Check A1c.  Continue NovoLog Mix 70/30 22 units twice daily.  Depending on results of A1c, may benefit from starting Ozempic which could also help with weight loss.  Discussed this possibility with patient.  Also check CBC, CMET, and TSH.  Hyperlipidemia associated with type 2 diabetes mellitus (HCC) Check lipid panel.  Continue simvastatin 20 mg daily.  Chronic diastolic CHF (congestive heart failure) ([place]), ECHO 03/2017 EF 50-55%, on Torsemide Continue torsemide as needed.  Recommended patient take medications earlier in the day to prevent frequent nocturia.  Body mass index is 48 kg/m. / Morbid Obesity BMI Metric Follow Up - 06/16/19 1043      BMI Metric Follow Up-Please document annually   BMI Metric Follow Up  Education provided         Subjective:  HPI:  His stable, chronic medical conditions are outlined below:  #Diabetes - On insulin aspart 70/30 22 units twice daily and tolerating well. - Home sugars usually in the 100s.  No symptomatic lows. - ROS: No polyuria or polydipsia  #Dyslipidemia -On simvastatin 10 mg daily and tolerating well. -ROS: No myalgias.  # Essential Hypertension -On metoprolol tartrate 50 mg twice daily and tolerating well. -ROS: No chest pain or shortness of breath.  % CHF/ NICM / PAF - Follows with cardiology. - Takes Torsemide as needed for leg swelling.    ROS: Per HPI  PMH: He reports that he quit smoking about 10 years ago. His smoking use included cigarettes. He has a 10.00 pack-year smoking history. He has never used smokeless tobacco. He reports  that he does not drink alcohol or use drugs.      Objective:  Physical Exam: BP 140/86 (BP Location: Left Arm, Patient Position: Sitting, Cuff Size: Large)   Pulse 84   Temp 98.6 F (37 C) (Oral)   Ht 6\' 3"  (1.905 m)   Wt (!) 384 lb (174.2 kg)   SpO2 99%   BMI 48.00 kg/m   Wt Readings from Last 3 Encounters:  06/16/19 (!) 384 lb (174.2 kg)  12/16/18 (!) 375 lb (170.1 kg)  06/10/18 (!) 363 lb (164.7 kg)  Gen: NAD, resting comfortably CV: Regular rate and rhythm with no murmurs appreciated Pulm: Normal work of breathing, clear to auscultation bilaterally with no crackles, wheezes, or rhonchi GI: Normal bowel sounds present. Soft, Nontender, Nondistended. MSK: No edema, cyanosis, or clubbing noted Skin: Warm, dry Neuro: Grossly normal, moves all extremities Psych: Normal affect and thought content      Annalucia Laino M. [name], MD 06/16/2019 10:45 AM

## 2019-06-16 NOTE — Assessment & Plan Note (Signed)
Continue torsemide as needed.  Recommended patient take medications earlier in the day to prevent frequent nocturia.

## 2019-06-16 NOTE — Assessment & Plan Note (Signed)
At goal.  Continue metoprolol tartrate 50 mg twice daily.  He is looking into starting on CPAP which I think will help also.  Discussed importance of regular exercise and low-salt diet.

## 2019-06-16 NOTE — Assessment & Plan Note (Signed)
Check A1c.  Continue NovoLog Mix 70/30 22 units twice daily.  Depending on results of A1c, may benefit from starting Ozempic which could also help with weight loss.  Discussed this possibility with patient.  Also check CBC, CMET, and TSH.

## 2019-06-16 NOTE — Patient Instructions (Signed)
It was very nice to see you today!  Please try taking your water pill early in the morning and early in the afternoon.  We will check blood work today.  We may start a new medication called ozempic depending on the results of your blood work that may help with weight loss.   No other medication changes today.  Come back in 6 months for your next check up, or sooner as needed.   Take care, Dr [name]  Please try these tips to maintain a healthy lifestyle:   Eat at least 3 REAL meals and 1-2 snacks per day.  Aim for no more than 5 hours between eating.  If you eat breakfast, please do so within one hour of getting up.    Obtain twice as many fruits/vegetables as protein or carbohydrate foods for both lunch and dinner. (Half of each meal should be fruits/vegetables, one quarter protein, and one quarter starchy carbs)   Cut down on sweet beverages. This includes juice, soda, and sweet tea.    Exercise at least 150 minutes every week.

## 2019-06-17 NOTE — Progress Notes (Signed)
Please inform patient of the following:  Blood sugar is a bit elevated but the rest of his labs are all stable. [name] - we discussed this at his last office visit. Please send in rx for patient. Would like for him to follow up in 3 months to recheck blood sugar.  Luis [name]. [name], MD 06/17/2019 8:42 AM

## 2019-06-20 ENCOUNTER — Other Ambulatory Visit: Payer: Self-pay

## 2019-06-20 MED ORDER — OZEMPIC (0.25 OR 0.5 MG/DOSE) 2 MG/1.5ML ~~LOC~~ SOPN: 0.2500 mg | PEN_INJECTOR | SUBCUTANEOUS | 3 refills | Status: DC

## 2019-06-23 ENCOUNTER — Other Ambulatory Visit: Payer: Self-pay | Admitting: Family Medicine

## 2019-06-23 DIAGNOSIS — E876 Hypokalemia: Secondary | ICD-10-CM

## 2019-07-10 ENCOUNTER — Other Ambulatory Visit: Payer: Self-pay | Admitting: Family Medicine

## 2019-07-10 DIAGNOSIS — I509 Heart failure, unspecified: Secondary | ICD-10-CM

## 2019-07-12 ENCOUNTER — Other Ambulatory Visit: Payer: Self-pay | Admitting: Family Medicine

## 2019-07-12 DIAGNOSIS — E876 Hypokalemia: Secondary | ICD-10-CM

## 2019-07-18 ENCOUNTER — Ambulatory Visit: Payer: Self-pay | Admitting: *Deleted

## 2019-07-18 NOTE — Telephone Encounter (Signed)
Pt questioning of 'Pink eye  Is contagious."   States he has to watch his granddaughter this afternoon; she has pink eye . Questions answered and precautions reviewed per pt's satisfaction.  Advised to [facility] for any additional questions.

## 2019-07-26 ENCOUNTER — Other Ambulatory Visit: Payer: Self-pay | Admitting: Family Medicine

## 2019-07-26 DIAGNOSIS — E876 Hypokalemia: Secondary | ICD-10-CM

## 2019-08-03 ENCOUNTER — Encounter: Payer: Self-pay | Admitting: Podiatry

## 2019-08-03 ENCOUNTER — Other Ambulatory Visit: Payer: Self-pay

## 2019-08-03 ENCOUNTER — Ambulatory Visit (INDEPENDENT_AMBULATORY_CARE_PROVIDER_SITE_OTHER): Payer: BC Managed Care – PPO | Admitting: Podiatry

## 2019-08-03 DIAGNOSIS — M79674 Pain in right toe(s): Secondary | ICD-10-CM

## 2019-08-03 DIAGNOSIS — B351 Tinea unguium: Secondary | ICD-10-CM

## 2019-08-03 DIAGNOSIS — Z794 Long term (current) use of insulin: Secondary | ICD-10-CM | POA: Diagnosis not present

## 2019-08-03 DIAGNOSIS — E1165 Type 2 diabetes mellitus with hyperglycemia: Secondary | ICD-10-CM | POA: Diagnosis not present

## 2019-08-03 DIAGNOSIS — M79675 Pain in left toe(s): Secondary | ICD-10-CM

## 2019-08-03 NOTE — Progress Notes (Signed)
Complaint:  Visit Type: Patient returns to my office for continued preventative foot care services. Complaint: Patient states" my nails have grown long and thick and become painful to walk and wear shoes" Patient has been diagnosed with DM with no foot complications. The patient presents for preventative foot care services. No changes to ROS  Podiatric Exam: Vascular: dorsalis pedis and posterior tibial pulses are palpable bilateral. Capillary return is immediate. Temperature gradient is WNL. Skin turgor WNL  Sensorium: Normal Semmes Weinstein monofilament test. Normal tactile sensation bilaterally. Nail Exam: Pt has thick disfigured discolored nails with subungual debris noted bilateral entire nail hallux through fifth toenails Ulcer Exam: There is no evidence of ulcer or pre-ulcerative changes or infection. Orthopedic Exam: Muscle tone and strength are WNL. No limitations in general ROM. No crepitus or effusions noted. Foot type and digits show no abnormalities. Bony prominences are unremarkable. Skin: No Porokeratosis. No infection or ulcers  Diagnosis:  Onychomycosis, , Pain in right toe, pain in left toes  Treatment & Plan Procedures and Treatment: Consent by patient was obtained for treatment procedures.   Debridement of mycotic and hypertrophic toenails, 1 through 5 bilateral and clearing of subungual debris. No ulceration, no infection noted.  Return Visit-Office Procedure: Patient instructed to return to the office for a follow up visit 3 months for continued evaluation and treatment.    Zanna Hawn DPM 

## 2019-08-08 ENCOUNTER — Telehealth: Payer: Self-pay | Admitting: Family Medicine

## 2019-08-08 NOTE — Telephone Encounter (Signed)
Patient states he has a follow up in November. Patient has not been taken medication due to pharmacy will not fill it due to insurance.  Patient needs a PA for insurance.  Patient call back # [phone]  Semaglutide,0.25 or 0.5MG /DOS, (OZEMPIC, 0.25 OR 0.5 MG/DOSE,) 2 MG/1.5ML [place], [place] [phone] (Phone) [phone] (Fax)

## 2019-08-08 NOTE — Telephone Encounter (Signed)
See note

## 2019-08-09 ENCOUNTER — Other Ambulatory Visit: Payer: Self-pay | Admitting: Family Medicine

## 2019-08-09 DIAGNOSIS — I509 Heart failure, unspecified: Secondary | ICD-10-CM

## 2019-08-09 NOTE — Telephone Encounter (Signed)
Notified patient that we will work on [place] for [employer], also wants to know if its ok to follow up in January

## 2019-08-10 NOTE — Telephone Encounter (Signed)
PA is in process. 

## 2019-08-11 NOTE — Telephone Encounter (Signed)
Left voice message stating PA in progress.

## 2019-09-01 ENCOUNTER — Other Ambulatory Visit: Payer: Self-pay | Admitting: Family Medicine

## 2019-09-01 MED ORDER — OZEMPIC (0.25 OR 0.5 MG/DOSE) 2 MG/1.5ML ~~LOC~~ SOPN: 0.2500 mg | PEN_INJECTOR | SUBCUTANEOUS | 3 refills | Status: DC

## 2019-09-01 NOTE — Telephone Encounter (Signed)
Medication Refill - Medication:  Semaglutide,0.25 or 0.5MG /DOS, (OZEMPIC, 0.25 OR 0.5 MG/DOSE,) 2 MG/1.5ML SOPN [[id number]]    Preferred Pharmacy (with phone number or street name):  [facility] [place], [address] [place]  [place] [address]  Phone: [phone] Fax: [phone]     Agent: Please be advised that RX refills may take up to 3 business days. We ask that you follow-up with your pharmacy.

## 2019-09-01 NOTE — Telephone Encounter (Signed)
Medication Refill - Medication:Insulin Pen Needle 31G X 8 MM MISC [[id number]]      Preferred Pharmacy (with phone number or street name):  [facility] [place], [address]  [place] [address]  Phone: [phone] Fax: [phone]     Agent: Please be advised that RX refills may take up to 3 business days. We ask that you follow-up with your pharmacy.

## 2019-09-09 ENCOUNTER — Other Ambulatory Visit: Payer: Self-pay | Admitting: Family Medicine

## 2019-09-09 DIAGNOSIS — I509 Heart failure, unspecified: Secondary | ICD-10-CM

## 2019-09-22 ENCOUNTER — Ambulatory Visit: Payer: BC Managed Care – PPO | Admitting: Family Medicine

## 2019-09-23 ENCOUNTER — Other Ambulatory Visit: Payer: Self-pay | Admitting: Family Medicine

## 2019-09-23 DIAGNOSIS — E876 Hypokalemia: Secondary | ICD-10-CM

## 2019-10-13 ENCOUNTER — Other Ambulatory Visit: Payer: Self-pay | Admitting: Family Medicine

## 2019-10-13 DIAGNOSIS — I509 Heart failure, unspecified: Secondary | ICD-10-CM

## 2019-10-15 ENCOUNTER — Emergency Department (HOSPITAL_COMMUNITY)
Admission: EM | Admit: 2019-10-15 | Discharge: 2019-10-15 | Disposition: A | Discharge status: Home / Self Care | Payer: BC Managed Care – PPO | Attending: Emergency Medicine | Admitting: Emergency Medicine

## 2019-10-15 ENCOUNTER — Other Ambulatory Visit: Payer: Self-pay

## 2019-10-15 ENCOUNTER — Encounter (HOSPITAL_COMMUNITY): Payer: Self-pay | Admitting: Emergency Medicine

## 2019-10-15 ENCOUNTER — Emergency Department (HOSPITAL_COMMUNITY): Payer: BC Managed Care – PPO

## 2019-10-15 DIAGNOSIS — Z20828 Contact with and (suspected) exposure to other viral communicable diseases: Secondary | ICD-10-CM | POA: Diagnosis not present

## 2019-10-15 DIAGNOSIS — Z20822 Contact with and (suspected) exposure to covid-19: Secondary | ICD-10-CM

## 2019-10-15 DIAGNOSIS — R509 Fever, unspecified: Secondary | ICD-10-CM | POA: Diagnosis not present

## 2019-10-15 DIAGNOSIS — R5383 Other fatigue: Secondary | ICD-10-CM | POA: Diagnosis not present

## 2019-10-15 DIAGNOSIS — E1165 Type 2 diabetes mellitus with hyperglycemia: Secondary | ICD-10-CM | POA: Diagnosis not present

## 2019-10-15 DIAGNOSIS — R350 Frequency of micturition: Secondary | ICD-10-CM | POA: Insufficient documentation

## 2019-10-15 DIAGNOSIS — I5042 Chronic combined systolic (congestive) and diastolic (congestive) heart failure: Secondary | ICD-10-CM | POA: Diagnosis not present

## 2019-10-15 DIAGNOSIS — Z79899 Other long term (current) drug therapy: Secondary | ICD-10-CM | POA: Insufficient documentation

## 2019-10-15 DIAGNOSIS — Z87891 Personal history of nicotine dependence: Secondary | ICD-10-CM | POA: Insufficient documentation

## 2019-10-15 DIAGNOSIS — R739 Hyperglycemia, unspecified: Secondary | ICD-10-CM

## 2019-10-15 DIAGNOSIS — Z7982 Long term (current) use of aspirin: Secondary | ICD-10-CM | POA: Diagnosis not present

## 2019-10-15 DIAGNOSIS — I110 Hypertensive heart disease with heart failure: Secondary | ICD-10-CM | POA: Insufficient documentation

## 2019-10-15 DIAGNOSIS — Z794 Long term (current) use of insulin: Secondary | ICD-10-CM | POA: Insufficient documentation

## 2019-10-15 DIAGNOSIS — E119 Type 2 diabetes mellitus without complications: Secondary | ICD-10-CM | POA: Insufficient documentation

## 2019-10-15 DIAGNOSIS — R6510 Systemic inflammatory response syndrome (SIRS) of non-infectious origin without acute organ dysfunction: Secondary | ICD-10-CM | POA: Insufficient documentation

## 2019-10-15 DIAGNOSIS — Z03818 Encounter for observation for suspected exposure to other biological agents ruled out: Secondary | ICD-10-CM | POA: Diagnosis not present

## 2019-10-15 DIAGNOSIS — R9431 Abnormal electrocardiogram [ECG] [EKG]: Secondary | ICD-10-CM | POA: Diagnosis not present

## 2019-10-15 LAB — CBC
HCT: 44.7 % (ref 39.0–52.0)
Hemoglobin: 14.7 g/dL (ref 13.0–17.0)
MCH: 31.2 pg (ref 26.0–34.0)
MCHC: 32.9 g/dL (ref 30.0–36.0)
MCV: 94.9 fL (ref 80.0–100.0)
Platelets: 209 10*3/uL (ref 150–400)
RBC: 4.71 MIL/uL (ref 4.22–5.81)
RDW: 13 % (ref 11.5–15.5)
WBC: 5.1 10*3/uL (ref 4.0–10.5)
nRBC: 0 % (ref 0.0–0.2)

## 2019-10-15 LAB — BASIC METABOLIC PANEL
Anion gap: 8 (ref 5–15)
BUN: 13 mg/dL (ref 6–20)
CO2: 23 mmol/L (ref 22–32)
Calcium: 8.8 mg/dL — ABNORMAL LOW (ref 8.9–10.3)
Chloride: 103 mmol/L (ref 98–111)
Creatinine, Ser: 1.33 mg/dL — ABNORMAL HIGH (ref 0.61–1.24)
GFR calc Af Amer: >60 mL/min (ref >60)
GFR calc non Af Amer: >60 mL/min (ref >60)
Glucose, Bld: 485 mg/dL — ABNORMAL HIGH (ref 70–99)
Potassium: 4.1 mmol/L (ref 3.5–5.1)
Sodium: 134 mmol/L — ABNORMAL LOW (ref 135–145)

## 2019-10-15 LAB — CBG MONITORING, ED
Glucose-Capillary: 432 mg/dL — ABNORMAL HIGH (ref 70–99)
Glucose-Capillary: 469 mg/dL — ABNORMAL HIGH (ref 70–99)
Glucose-Capillary: 358 mg/dL — ABNORMAL HIGH (ref 70–99)

## 2019-10-15 LAB — PROTIME-INR
INR: 1 (ref 0.8–1.2)
Prothrombin Time: 13.6 seconds (ref 11.4–15.2)

## 2019-10-15 LAB — URINALYSIS, ROUTINE W REFLEX MICROSCOPIC
Bacteria, UA: NONE SEEN
Bilirubin Urine: NEGATIVE
Glucose, UA: >=500 mg/dL — AB
Hgb urine dipstick: NEGATIVE
Ketones, ur: NEGATIVE mg/dL
Leukocytes,Ua: NEGATIVE
Nitrite: NEGATIVE
Protein, ur: 30 mg/dL — AB
Specific Gravity, Urine: 1.03 (ref 1.005–1.030)
pH: 6 (ref 5.0–8.0)

## 2019-10-15 LAB — APTT: aPTT: 25 seconds (ref 24–36)

## 2019-10-15 LAB — LACTIC ACID, PLASMA: Lactic Acid, Venous: 1.6 mmol/L (ref 0.5–1.9)

## 2019-10-15 LAB — POC SARS CORONAVIRUS 2 AG -  ED: SARS Coronavirus 2 Ag: NEGATIVE

## 2019-10-15 MED ORDER — SODIUM CHLORIDE 0.9 % IV SOLN: 2.0000 g | Freq: Once | INTRAVENOUS | Status: DC

## 2019-10-15 MED ORDER — SODIUM CHLORIDE 0.9 % IV SOLN: 2.0000 g | Freq: Three times a day (TID) | INTRAVENOUS | Status: DC

## 2019-10-15 MED ORDER — SODIUM CHLORIDE 0.9 % IV SOLN
2.0000 g | Freq: Once | INTRAVENOUS | Status: AC
  Administered 2019-10-15: 19:00:00 2 g via INTRAVENOUS
  Filled 2019-10-15: qty 20

## 2019-10-15 MED ORDER — LACTATED RINGERS IV BOLUS (SEPSIS)
1000.0000 mL | Freq: Once | INTRAVENOUS | Status: AC
  Administered 2019-10-15: 1000 mL via INTRAVENOUS

## 2019-10-15 MED ORDER — INSULIN ASPART 100 UNIT/ML ~~LOC~~ SOLN
8.0000 [IU] | Freq: Once | SUBCUTANEOUS | Status: AC
  Administered 2019-10-15: 20:00:00 8 [IU] via SUBCUTANEOUS

## 2019-10-15 MED ORDER — SODIUM CHLORIDE 0.9 % IV BOLUS
1000.0000 mL | Freq: Once | INTRAVENOUS | Status: AC
  Administered 2019-10-15: 1000 mL via INTRAVENOUS

## 2019-10-15 NOTE — Progress Notes (Signed)
Notified bedside nurse of need to draw lactic acid and blood cultures.  

## 2019-10-15 NOTE — Discharge Instructions (Addendum)
We saw the ER for elevated blood sugar.  We noted that you were febrile.  Our history, exam and lab findings do not indicate any specific source of infection.  We see some signs of dehydration, and recommend that you hydrate yourself well.  We have tested you for COVID-19 and the results of the PCR test are pending.  Until those test results come back negative we advise you to self isolate.  Please follow-up with your doctor in 1 week. Return to the ER if your symptoms get worse or if the blood sugar continue to rise.

## 2019-10-15 NOTE — ED Triage Notes (Signed)
Pt states CBG reading "high" at home.  Denies any complaints other than being tired.

## 2019-10-15 NOTE — Progress Notes (Signed)
Notified bedside nurse of need to administer antibiotics.  

## 2019-10-15 NOTE — ED Notes (Signed)
Pt dislodged IV, ABX delayed

## 2019-10-15 NOTE — ED Provider Notes (Signed)
[facility] Provider Note   CSN: [id number] Arrival date & time: 10/15/19  1436     History Chief Complaint  Patient presents with  . Hyperglycemia    Luis [name] is a 53 y.o. male.  HPI    53 year old with history of diabetes, hypertension, CHF, OSA comes in a chief complaint of elevated blood sugar.  Patient reports that he was feeling well yesterday.  He woke up this morning has been feeling weak.  He is also noted that his blood sugars are rising.  He comes into the ER for evaluation of his rising blood sugar.    Patient denies any chest pain, cough, shortness of breath, abdominal pain, nausea, vomiting, diarrhea.  He has frequent urination but denies any pain with urination or blood in the urine.  Patient denies any sick exposures.  Past Medical History:  Diagnosis Date  . Chronic combined systolic and diastolic CHF (congestive heart failure) (HCC)   . DM (diabetes mellitus) (HCC)   . Essential hypertension   . History of tobacco abuse   . HTN (hypertension)   . Hyperlipidemia   . Mild CAD   . Morbid obesity (HCC)   . NICM (nonischemic cardiomyopathy) (HCC)   . Noncompliance   . OSA (obstructive sleep apnea)   . Pneumonia 2017  . Premature atrial contractions     Patient Active Problem List   Diagnosis Date Noted  . Pain due to onychomycosis of toenails of both feet 04/29/2019  . Diabetes (HCC) 02/02/2018  . Compliance poor, due to mulitple factors 01/02/2018  . Renal insufficiency 01/01/2018  . Onychomycosis, followed by Podiatry 06/14/2017  . Bilateral edema of lower extremity 06/14/2017  . Hyperlipidemia associated with type 2 diabetes mellitus (HCC)   . Difficulty walking, debility, deconditioned 06/11/2017  . OSA (obstructive sleep apnea), mild, not using CPAP 03/31/2017  . NICM (nonischemic cardiomyopathy) (HCC), left heart cath and coronary angiography 2017   . Morbid obesity (HCC)   . Former smoker    . Chronic diastolic CHF (congestive heart failure) (HCC), ECHO 03/2017 EF 50-55%, on Torsemide   . Hypertension associated with diabetes (HCC) 03/19/2016  . Paroxysmal atrial fibrillation (HCC) 03/19/2016    Past Surgical History:  Procedure Laterality Date  . CARDIAC CATHETERIZATION N/A 03/24/2016   Procedure: Left Heart Cath and Coronary Angiography;  Surgeon: Marykay Lexavid W Harding, MD;  [facility];  Service: Cardiovascular;  Laterality: N/A;  . FEMUR IM NAIL Right 2012  . FRACTURE SURGERY         Family History  Problem Relation Age of Onset  . Diabetes Mellitus II Mother   . Diabetes type II Other   . Hypertension Maternal Grandfather   . Stroke Neg Hx   . Cancer Neg Hx     Social History   Tobacco Use  . Smoking status: Former Smoker    Packs/day: 0.50    Years: 20.00    Pack years: 10.00    Types: Cigarettes    Quit date: 03/02/2009    Years since quitting: 10.6  . Smokeless tobacco: Never Used  Substance Use Topics  . Alcohol use: No  . Drug use: No    Home Medications Prior to Admission medications   Medication Sig Start Date End Date Taking? Authorizing Provider  albuterol (PROVENTIL HFA;VENTOLIN HFA) 108 (90 Base) MCG/ACT inhaler Inhale 2 puffs into the lungs every 4 (four) hours as needed for wheezing. 12/16/18   Ardith DarkParker, Caleb M,  MD  aspirin EC 81 MG EC tablet Take 1 tablet (81 mg total) by mouth daily. 03/26/16   Theodis Blaze, MD  insulin aspart protamine - aspart (NOVOLOG MIX 70/30 FLEXPEN) (70-30) 100 UNIT/ML FlexPen Inject 0.22 mLs (22 Units total) into the skin 2 (two) times daily. 12/16/18   Vivi Barrack, MD  Insulin Pen Needle 31G X 8 MM MISC Use to inject insulin twice daily 04/13/19   Vivi Barrack, MD  Insulin Pen Needle 32G X 6 MM MISC Use to administer insulin two times daily 04/05/18   Vivi Barrack, MD  metoprolol tartrate (LOPRESSOR) 50 MG tablet TAKE 1 TABLET(50 MG) BY MOUTH TWICE DAILY 05/27/19   Vivi Barrack, MD  potassium  chloride SA (KLOR-CON) 20 MEQ tablet TAKE 1 TABLET(20 MEQ) BY MOUTH DAILY 09/23/19   Vivi Barrack, MD  Saccharomyces boulardii (PROBIOTIC) 250 MG CAPS Take 250 mg by mouth daily.    [provider]  Semaglutide,0.25 or 0.5MG /DOS, (OZEMPIC, 0.25 OR 0.5 MG/DOSE,) 2 MG/1.5ML SOPN Inject 0.25 mg into the skin once a week. 09/01/19   Vivi Barrack, MD  simvastatin (ZOCOR) 20 MG tablet TAKE 1 TABLET(20 MG) BY MOUTH AT BEDTIME 05/30/19   Vivi Barrack, MD  torsemide (DEMADEX) 10 MG tablet TAKE 1 TO 2 TABLETS BY MOUTH DAILY FOR SWELLING 10/14/19   Vivi Barrack, MD    Allergies    Demerol [meperidine] and Morphine and related  Review of Systems   Review of Systems  Constitutional: Positive for activity change.  Gastrointestinal: Negative for nausea and vomiting.  Genitourinary: Positive for frequency. Negative for dysuria.  Allergic/Immunologic: Negative for immunocompromised state.  Hematological: Does not bruise/bleed easily.  All other systems reviewed and are negative.   Physical Exam Updated Vital Signs BP (!) 147/90   Pulse 91   Temp 99.5 F (37.5 C) (Oral)   Resp (!) 25   SpO2 95%   Physical Exam Vitals and nursing note reviewed.  Constitutional:      Appearance: He is well-developed.  HENT:     Head: Atraumatic.     Mouth/Throat:     Mouth: Mucous membranes are moist.  Cardiovascular:     Rate and Rhythm: Normal rate.  Pulmonary:     Effort: Pulmonary effort is normal.  Abdominal:     Tenderness: There is no abdominal tenderness.     Hernia: No hernia is present.  Musculoskeletal:     Cervical back: Neck supple.  Skin:    General: Skin is warm.  Neurological:     Mental Status: He is alert and oriented to person, place, and time.     ED Results / Procedures / Treatments   Labs (all labs ordered are listed, but only abnormal results are displayed) Labs Reviewed  BASIC METABOLIC PANEL - Abnormal; Notable for the following components:       Result Value   Sodium 134 (*)    Glucose, Bld 485 (*)    Creatinine, Ser 1.33 (*)    Calcium 8.8 (*)    All other components within normal limits  URINALYSIS, ROUTINE W REFLEX MICROSCOPIC - Abnormal; Notable for the following components:   Glucose, UA >=500 (*)    Protein, ur 30 (*)    All other components within normal limits  CBG MONITORING, ED - Abnormal; Notable for the following components:   Glucose-Capillary 469 (*)    All other components within normal limits  CBG MONITORING, ED - Abnormal;  Notable for the following components:   Glucose-Capillary 432 (*)    All other components within normal limits  CBG MONITORING, ED - Abnormal; Notable for the following components:   Glucose-Capillary 358 (*)    All other components within normal limits  CULTURE, BLOOD (ROUTINE X 2)  CULTURE, BLOOD (ROUTINE X 2)  URINE CULTURE  SARS CORONAVIRUS 2 (TAT 6-24 HRS)  CBC  APTT  PROTIME-INR  LACTIC ACID, PLASMA  POC SARS CORONAVIRUS 2 AG -  ED    EKG EKG Interpretation  Date/Time:  Saturday October 15 2019 18:59:11 EST Ventricular Rate:  87 PR Interval:    QRS Duration: 105 QT Interval:  344 QTC Calculation: 414 R Axis:   60 Text Interpretation: Sinus rhythm Probable left atrial enlargement Borderline T abnormalities, inferior leads No acute changes Nonspecific ST and T wave abnormality Confirmed by Derwood Kaplan ([id number]) on 10/15/2019 7:29:02 PM   Radiology DG Chest Port 1 View  Result Date: 10/15/2019 CLINICAL DATA:  Pt states CBG reading "high" at home. Denies any complaints other than being tired. EXAM: PORTABLE CHEST 1 VIEW COMPARISON:  Chest radiograph [id number] FINDINGS: Stable cardiomediastinal contours with mildly enlarged heart size. The lungs are clear. No pneumothorax or large pleural effusion. No acute finding in the visualized skeleton. IMPRESSION: No acute cardiopulmonary finding. Electronically Signed   By: Emmaline Kluver M.D.   On: 10/15/2019 18:18     Procedures Procedures (including critical care time)  Medications Ordered in ED Medications  sodium chloride 0.9 % bolus 1,000 mL (0 mLs Intravenous Stopped 10/15/19 1937)  lactated ringers bolus 1,000 mL (1,000 mLs Intravenous New Bag/Given 10/15/19 1937)  cefTRIAXone (ROCEPHIN) 2 g in sodium chloride 0.9 % 100 mL IVPB (0 g Intravenous Stopped 10/15/19 1933)  insulin aspart (novoLOG) injection 8 Units (8 Units Subcutaneous Given 10/15/19 1937)    ED Course  I have reviewed the triage vital signs and the nursing notes.  Pertinent labs & imaging results that were available during my care of the patient were reviewed by me and considered in my medical decision making (see chart for details).  Clinical Course as of Oct 14 2057  Sat Oct 15, 2019  3888 Patient reassessed.  He informed me that he does not want to stay in the hospital.  I informed him that he has tachycardia, tachypnea, fevers and we are concerned that there could be underlying sepsis.  Additionally we also wanted to rule out COVID-19.  All of his lab results are still not back, but there is a possibility that he might need admission to the hospital.  Patient is quite adamant that he would prefer going home.  We will check with him once his results come back.   [AN]  2056 UA is negative for any signs of infection.  Metabolic profile does not reveal any anion gap and patient is not in DKA.  His lactic acid is normal.  Point-of-care Covid test is negative.  CBC does not reveal any elevated markers.  Patient wants to go home still.  I think it is okay for him to go home.  We do not have a source of infection based on history and exam.  We will not sending home with antibiotics.  He is willing to call his PCP and follow-up with them soon.  I advised him that the PCR Covid is still pending so he needs to be careful and ideally self isolate until the results come back.  Patient also explained the rationale for  conservative  management of his hyperglycemia.  He is not in DKA and given his fever we suspect that elevated blood sugar is reactive to acute stress.  Stable for discharge.  Urinalysis, Routine w reflex microscopic(!) [AN]    Clinical Course User Index [AN] Derwood Kaplan, MD   MDM Rules/Calculators/A&P                            53 year old comes in a chief complaint of elevated blood sugar. He appeared warm to touch and rectal temp confirms a fever.  He has history of diabetes and states that he was feeling well.  No COVID-19 exposures that he knows of.  Differential diagnosis includes underlying infectious process including UTI, COVID-19.  Sepsis is also possible given multiple sirs criteria.  Additionally DKA, electrolyte abnormalities and renal failure considered in the differential.  Adden Strout was evaluated in Emergency Department on 10/15/2019 for the symptoms described in the history of present illness. He was evaluated in the context of the global COVID-19 pandemic, which necessitated consideration that the patient might be at risk for infection with the SARS-CoV-2 virus that causes COVID-19. Institutional protocols and algorithms that pertain to the evaluation of patients at risk for COVID-19 are in a state of rapid change based on information released by regulatory bodies including the CDC and federal and state organizations. These policies and algorithms were followed during the patient's care in the ED.   Final Clinical Impression(s) / ED Diagnoses Final diagnoses:  Hyperglycemia  SIRS (systemic inflammatory response syndrome) (HCC)  Suspected COVID-19 virus infection    Rx / DC Orders ED Discharge Orders    None       Derwood Kaplan, MD 10/15/19 2059

## 2019-10-16 LAB — SARS CORONAVIRUS 2 (TAT 6-24 HRS): SARS Coronavirus 2: NEGATIVE

## 2019-10-17 ENCOUNTER — Ambulatory Visit (HOSPITAL_COMMUNITY)
Admission: EM | Admit: 2019-10-17 | Discharge: 2019-10-17 | Disposition: A | Discharge status: Home / Self Care | Payer: BC Managed Care – PPO | Attending: Physician Assistant | Admitting: Physician Assistant

## 2019-10-17 ENCOUNTER — Other Ambulatory Visit: Payer: Self-pay

## 2019-10-17 ENCOUNTER — Telehealth: Payer: Self-pay | Admitting: Family Medicine

## 2019-10-17 ENCOUNTER — Encounter (HOSPITAL_COMMUNITY): Payer: Self-pay | Admitting: Emergency Medicine

## 2019-10-17 DIAGNOSIS — I10 Essential (primary) hypertension: Secondary | ICD-10-CM

## 2019-10-17 DIAGNOSIS — R739 Hyperglycemia, unspecified: Secondary | ICD-10-CM

## 2019-10-17 LAB — URINE CULTURE: Culture: NO GROWTH

## 2019-10-17 LAB — GLUCOSE, CAPILLARY: Glucose-Capillary: 280 mg/dL — ABNORMAL HIGH (ref 70–99)

## 2019-10-17 LAB — CBG MONITORING, ED: Glucose-Capillary: 280 mg/dL — ABNORMAL HIGH (ref 70–99)

## 2019-10-17 NOTE — Telephone Encounter (Signed)
Copied from [place] [phone]. Topic: Appointment Scheduling - Scheduling Inquiry for Clinic >> Oct 17, 2019 11:31 AM Rainey Pines A wrote: Patient needing to book ER follow within next 3 days. Please advise  Please advise if OK to use virtual visit spot for in office appt or sameday slot to work in pt for ER follow up. Thank you!

## 2019-10-17 NOTE — ED Provider Notes (Signed)
[facility]    CSN: [id number] Arrival date & time: 10/17/19  0845      History   Chief Complaint Chief Complaint  Patient presents with  . Hyperglycemia    HPI Luis [name] is a 53 y.o. male.    Patient reports to urgent care today for elevated blood sugar. He reports sugar in the 200s for the last 2 days. He took his sugar this morning, it was 291, he gave himself 22U novalog and consumed breakfast (buger and green beans). Following breakfast his sugar was 297. He was concern over this due to being the ED this weekend for high blood sugar. He reports compliance with his diabetes medications. He denies frequent urination. Denies blurry vision or other visual changes. He denies fever, chills, abdominal pain, nausea vomiting, diarrhea, constipation.   In clinic his initial blood pressures were 211/126 via forearm. Recheck with large cuff on upper arm, 183/84 and 175/111.  Patient reports not taking his torsemide since Friday due to being out of this prescription. He reports no symptoms such as chest pain, shortness of breath, increased swelling, headache, increase or decrease in urine out put. He reports he is due to pick this medication up today and is going to do so immediately following visit.    ED on 12/12 for same concern BGL found at 485, DKA and Sepsis ruled out. He was hydrated and discharged.      Past Medical History:  Diagnosis Date  . Chronic combined systolic and diastolic CHF (congestive heart failure) ([place])   . DM (diabetes mellitus) ([place])   . Essential hypertension   . History of tobacco abuse   . HTN (hypertension)   . Hyperlipidemia   . Mild CAD   . Morbid obesity ([place])   . NICM (nonischemic cardiomyopathy) ([place])   . Noncompliance   . OSA (obstructive sleep apnea)   . Pneumonia 2017  . Premature atrial contractions     Patient Active Problem List   Diagnosis Date Noted  . Pain due to onychomycosis of toenails of both feet  04/29/2019  . Diabetes ([place]) 02/02/2018  . Compliance poor, due to mulitple factors 01/02/2018  . Renal insufficiency 01/01/2018  . Onychomycosis, followed by Podiatry 06/14/2017  . Bilateral edema of lower extremity 06/14/2017  . Hyperlipidemia associated with type 2 diabetes mellitus ([place])   . Difficulty walking, debility, deconditioned 06/11/2017  . OSA (obstructive sleep apnea), mild, not using CPAP 03/31/2017  . NICM (nonischemic cardiomyopathy) ([place]), left heart cath and coronary angiography 2017   . Morbid obesity ([place])   . Former smoker   . Chronic diastolic CHF (congestive heart failure) ([place]), ECHO 03/2017 EF 50-55%, on Torsemide   . Hypertension associated with diabetes ([place]) 03/19/2016  . Paroxysmal atrial fibrillation ([place]) 03/19/2016    Past Surgical History:  Procedure Laterality Date  . CARDIAC CATHETERIZATION N/A 03/24/2016   Procedure: Left Heart Cath and Coronary Angiography;  Surgeon: Leonie Man, MD;  Location: [facility];  Service: Cardiovascular;  Laterality: N/A;  . FEMUR IM NAIL Right 2012  . FRACTURE SURGERY         Home Medications    Prior to Admission medications   Medication Sig Start Date End Date Taking? Authorizing Provider  aspirin EC 81 MG EC tablet Take 1 tablet (81 mg total) by mouth daily. 03/26/16  Yes Theodis Blaze, MD  insulin aspart protamine - aspart (NOVOLOG MIX 70/30 FLEXPEN) (70-30) 100 UNIT/ML FlexPen Inject 0.22 mLs (  22 Units total) into the skin 2 (two) times daily. 12/16/18  Yes Ardith Dark, MD  Insulin Pen Needle 31G X 8 MM MISC Use to inject insulin twice daily 04/13/19  Yes Ardith Dark, MD  metoprolol tartrate (LOPRESSOR) 50 MG tablet TAKE 1 TABLET(50 MG) BY MOUTH TWICE DAILY 05/27/19  Yes Ardith Dark, MD  potassium chloride SA (KLOR-CON) 20 MEQ tablet TAKE 1 TABLET(20 MEQ) BY MOUTH DAILY 09/23/19  Yes Ardith Dark, MD  simvastatin (ZOCOR) 20 MG tablet TAKE 1 TABLET(20 MG) BY MOUTH AT BEDTIME 05/30/19  Yes  Ardith Dark, MD  torsemide (DEMADEX) 10 MG tablet TAKE 1 TO 2 TABLETS BY MOUTH DAILY FOR SWELLING 10/14/19  Yes Ardith Dark, MD  albuterol (PROVENTIL HFA;VENTOLIN HFA) 108 (90 Base) MCG/ACT inhaler Inhale 2 puffs into the lungs every 4 (four) hours as needed for wheezing. 12/16/18   Ardith Dark, MD  Insulin Pen Needle 32G X 6 MM MISC Use to administer insulin two times daily 04/05/18   Ardith Dark, MD  Saccharomyces boulardii (PROBIOTIC) 250 MG CAPS Take 250 mg by mouth daily.    [provider]  Semaglutide,0.25 or 0.5MG /DOS, (OZEMPIC, 0.25 OR 0.5 MG/DOSE,) 2 MG/1.5ML SOPN Inject 0.25 mg into the skin once a week. 09/01/19   Ardith Dark, MD    Family History Family History  Problem Relation Age of Onset  . Diabetes Mellitus II Mother   . Diabetes type II Other   . Hypertension Maternal Grandfather   . Stroke Neg Hx   . Cancer Neg Hx     Social History Social History   Tobacco Use  . Smoking status: Former Smoker    Packs/day: 0.50    Years: 20.00    Pack years: 10.00    Types: Cigarettes    Quit date: 03/02/2009    Years since quitting: 10.6  . Smokeless tobacco: Never Used  Substance Use Topics  . Alcohol use: No  . Drug use: No     Allergies   Demerol [meperidine] and Morphine and related   Review of Systems Review of Systems  Constitutional: Negative for activity change, chills, fatigue and fever.  HENT: Negative for congestion, facial swelling, hearing loss, rhinorrhea, sinus pressure, sinus pain, sore throat and tinnitus.   Eyes: Negative for pain and visual disturbance.  Respiratory: Negative for cough, chest tightness and shortness of breath.   Cardiovascular: Negative for chest pain, palpitations and leg swelling.  Gastrointestinal: Negative for abdominal pain, constipation, diarrhea, nausea and vomiting.  Endocrine: Negative for polydipsia, polyphagia and polyuria.  Genitourinary: Negative for difficulty urinating, dysuria, frequency  and urgency.  Musculoskeletal: Negative for arthralgias and back pain.  Skin: Negative for color change and rash.  Neurological: Negative for dizziness, syncope, facial asymmetry, speech difficulty, weakness, light-headedness, numbness and headaches.  Psychiatric/Behavioral: Negative.   All other systems reviewed and are negative.    Physical Exam Triage Vital Signs ED Triage Vitals  Enc Vitals Group     BP 10/17/19 0928 (!) 221/142     Pulse Rate 10/17/19 0928 81     Resp 10/17/19 0928 (!) 24     Temp 10/17/19 0928 98.2 F (36.8 C)     Temp Source 10/17/19 0928 Oral     SpO2 10/17/19 0928 98 %     Weight --      Height --      Head Circumference --      Peak Flow --  Pain Score 10/17/19 0931 0     Pain Loc --      Pain Edu? --      Excl. in GC? --    No data found.  Updated Vital Signs BP (!) 183/84 (BP Location: Left Arm)   Pulse 81   Temp 98.2 F (36.8 C) (Oral)   Resp (!) 24   SpO2 98%   Visual Acuity Right Eye Distance:   Left Eye Distance:   Bilateral Distance:    Right Eye Near:   Left Eye Near:    Bilateral Near:     Physical Exam Vitals and nursing note reviewed.  Constitutional:      General: He is not in acute distress.    Appearance: Normal appearance. He is well-developed. He is obese. He is not ill-appearing.  HENT:     Head: Normocephalic and atraumatic.     Right Ear: External ear normal.     Left Ear: External ear normal.     Nose: Nose normal. No congestion or rhinorrhea.     Mouth/Throat:     Mouth: Mucous membranes are moist.     Pharynx: Oropharynx is clear.  Eyes:     General: No scleral icterus.    Extraocular Movements: Extraocular movements intact.     Conjunctiva/sclera: Conjunctivae normal.     Pupils: Pupils are equal, round, and reactive to light.  Cardiovascular:     Rate and Rhythm: Normal rate and regular rhythm.     Pulses: Normal pulses.     Heart sounds: No murmur. No friction rub. No gallop.   Pulmonary:      Effort: Pulmonary effort is normal. No respiratory distress.     Breath sounds: Normal breath sounds. No wheezing, rhonchi or rales.  Abdominal:     Palpations: Abdomen is soft.     Tenderness: There is no abdominal tenderness.  Musculoskeletal:        General: Normal range of motion.     Cervical back: Normal range of motion and neck supple. No tenderness.     Comments: Bilateral 1+ edema with tree barking bilaterally of lower extremities. No ulcer noted.  Lymphadenopathy:     Cervical: No cervical adenopathy.  Skin:    General: Skin is warm and dry.  Neurological:     General: No focal deficit present.     Mental Status: He is alert and oriented to person, place, and time.     Motor: No weakness.     Gait: Gait normal.     Comments: Cranial Nerves grossly normal to confrontation  Psychiatric:        Mood and Affect: Mood normal.        Behavior: Behavior normal.        Thought Content: Thought content normal.        Judgment: Judgment normal.      UC Treatments / Results  Labs (all labs ordered are listed, but only abnormal results are displayed) Labs Reviewed  GLUCOSE, CAPILLARY - Abnormal; Notable for the following components:      Result Value   Glucose-Capillary 280 (*)    All other components within normal limits  CBG MONITORING, ED - Abnormal; Notable for the following components:   Glucose-Capillary 280 (*)    All other components within normal limits    EKG   Radiology DG Chest Port 1 View  Result Date: 10/15/2019 CLINICAL DATA:  Pt states CBG reading "high" at home. Denies any complaints other than  being tired. EXAM: PORTABLE CHEST 1 VIEW COMPARISON:  Chest radiograph [id number] FINDINGS: Stable cardiomediastinal contours with mildly enlarged heart size. The lungs are clear. No pneumothorax or large pleural effusion. No acute finding in the visualized skeleton. IMPRESSION: No acute cardiopulmonary finding. Electronically Signed   By: Emmaline KluverNancy  Ballantyne M.D.    On: 10/15/2019 18:18    Procedures Procedures (including critical care time)  Medications Ordered in UC Medications - No data to display  Initial Impression / Assessment and Plan / UC Course  I have reviewed the triage vital signs and the nursing notes.  Pertinent labs & imaging results that were available during my care of the patient were reviewed by me and considered in my medical decision making (see chart for details).  Clinical Course as of Oct 16 1029  Mon Oct 17, 2019  0945 BP(!): 211/126 [JD]    Clinical Course User Index [JD] Sair Faulcon, Veryl SpeakJacob E, PA-C    #Hyperglycemia - BGL in clinic at 280. No sign of DKA or non-ketotic hypersmolar hyperglycemia at this time. Patient had work up on 10/15/2019 at the ED and DKA ruled out then with BGL at 485. No source for hyperglycemia determined. No obvious sign of infection today, consider non-compliance vs increased insulin need with disease progress. Directed patient to follow up with PCP within 3 days for management.  #Elevated Blood pressure - Established diagnosis. BP 211-220 on forearm cuff on presentation , recheck with large upper arm cuff 175-183 sys. Patient was in the 140s systolics in ED on 12/12. Has not taken torsemide since Friday due to running out. No acute symptoms. Instructed to restart this medication today and discuss with PCP along with blood sugar.   ED precautions below were given.   Final Clinical Impressions(s) / UC Diagnoses   Final diagnoses:  Hyperglycemia  Elevated blood pressure reading with diagnosis of hypertension     Discharge Instructions     Follow up within 3 days with your primary care to discuss your elevated blood sugar. In the mean time continue your medications as prescribed.  Resume your torsemide today and address this with your primary care as well.  Report to the emergency Department, If you have chest pain, shortness of breath, have very high blood sugars, feel as though you might pass  out or have slurred speech or severe head ache.    ED Prescriptions    None     PDMP not reviewed this encounter.   Hermelinda Medicusarr, Rosetta Rupnow E, PA-C 10/17/19 1035

## 2019-10-17 NOTE — Discharge Instructions (Addendum)
Follow up within 3 days with your primary care to discuss your elevated blood sugar. In the mean time continue your medications as prescribed.  Resume your torsemide today and address this with your primary care as well.  Report to the emergency Department, If you have chest pain, shortness of breath, have very high blood sugars, feel as though you might pass out or have slurred speech or severe head ache.

## 2019-10-17 NOTE — ED Triage Notes (Signed)
Pt states his blood sugars have been in the upper 200's for the last few days.  Pt is concerned that it has been that high.  Pt states he took 22U of his Novolog this morning after checking his BS that was 291. He ate a burger patty and green beans and then checked his BS again and it was 297.  Pt denies any symptoms.

## 2019-10-18 NOTE — Telephone Encounter (Signed)
Just scheduled with what you have or run it by Dr.Parker

## 2019-10-20 ENCOUNTER — Other Ambulatory Visit: Payer: Self-pay

## 2019-10-20 ENCOUNTER — Encounter: Payer: Self-pay | Admitting: Family Medicine

## 2019-10-20 ENCOUNTER — Ambulatory Visit (INDEPENDENT_AMBULATORY_CARE_PROVIDER_SITE_OTHER): Payer: BC Managed Care – PPO | Admitting: Family Medicine

## 2019-10-20 VITALS — BP 142/88 | HR 77 | Temp 97.7°F | Ht 75.0 in | Wt 377.0 lb

## 2019-10-20 DIAGNOSIS — Z794 Long term (current) use of insulin: Secondary | ICD-10-CM

## 2019-10-20 DIAGNOSIS — E1159 Type 2 diabetes mellitus with other circulatory complications: Secondary | ICD-10-CM | POA: Diagnosis not present

## 2019-10-20 DIAGNOSIS — E1165 Type 2 diabetes mellitus with hyperglycemia: Secondary | ICD-10-CM

## 2019-10-20 DIAGNOSIS — B372 Candidiasis of skin and nail: Secondary | ICD-10-CM

## 2019-10-20 DIAGNOSIS — I10 Essential (primary) hypertension: Secondary | ICD-10-CM

## 2019-10-20 DIAGNOSIS — I152 Hypertension secondary to endocrine disorders: Secondary | ICD-10-CM

## 2019-10-20 LAB — CULTURE, BLOOD (ROUTINE X 2)
Culture: NO GROWTH
Culture: NO GROWTH

## 2019-10-20 LAB — POCT GLYCOSYLATED HEMOGLOBIN (HGB A1C): Hemoglobin A1C: 10.2 % — AB (ref 4.0–5.6)

## 2019-10-20 MED ORDER — NYSTATIN 100000 UNIT/GM EX POWD: 1.0000 "application " | Freq: Three times a day (TID) | CUTANEOUS | 0 refills | Status: DC

## 2019-10-20 MED ORDER — OZEMPIC (0.25 OR 0.5 MG/DOSE) 2 MG/1.5ML ~~LOC~~ SOPN: 0.2500 mg | PEN_INJECTOR | SUBCUTANEOUS | 3 refills | Status: DC

## 2019-10-20 NOTE — Assessment & Plan Note (Signed)
Slightly above goal however much better than previous.  We will continue Metroprolol tartrate 50 mg twice daily.  He will follow-up with me in a month.

## 2019-10-20 NOTE — Patient Instructions (Signed)
It was very nice to see you today!  I will send in [place].  Please start this 0.25 mg once weekly.  If your insurance does not approve this, please increase your insulin to 24 units twice daily.  Please use the powder if your rash comes back.  Come back to see me in a month to recheck your blood sugars, or sooner if needed.  Take care, Dr [name]  Please try these tips to maintain a healthy lifestyle:   Eat at least 3 REAL meals and 1-2 snacks per day.  Aim for no more than 5 hours between eating.  If you eat breakfast, please do so within one hour of getting up.    Each meal should contain half fruits/vegetables, one quarter protein, and one quarter carbs (no bigger than a computer mouse)   Cut down on sweet beverages. This includes juice, soda, and sweet tea.     Drink at least 1 glass of water with each meal and aim for at least 8 glasses per day   Exercise at least 150 minutes every week.

## 2019-10-20 NOTE — Assessment & Plan Note (Signed)
A1c 10.2.  We will try to resend and Ozempic though not sure if his insurance will approve this.  Start at 0.25 mg daily.  If not able to get this started, we will increase his NovoLog to 24 units twice daily.  He will follow-up with me in about a month.

## 2019-10-20 NOTE — Progress Notes (Signed)
   Chief Complaint:  Luis [name] is a 53 y.o. male who presents today with a chief complaint of diabetes.   Assessment/Plan:  Hypertension associated with diabetes ([place]) Slightly above goal however much better than previous.  We will continue Metroprolol tartrate 50 mg twice daily.  He will follow-up with me in a month.  Diabetes (HCC) A1c 10.2.  We will try to resend and Ozempic though not sure if his insurance will approve this.  Start at 0.25 mg daily.  If not able to get this started, we will increase his NovoLog to 24 units twice daily.  He will follow-up with me in about a month.  Candidal intertrigo Seems to be improving.  Will start nystatin to prevent recurrence.    Subjective:  HPI:   Patient has had elevated blood sugar readings for the past week or so.  Went to the ED 3 days ago and found to have elevated blood sugar readings into the 400s.  Had work-up for DKA at that time which was negative.  He was discharged home after receiving normal saline.  Went to urgent care a few days later and found to have sugars in the 280s.  Over the last few days sugars have been in the 200s.  Does admit to some dietary discretions and has recently eating a lot of candy with his granddaughter.  Does not have any reported missed doses.  He also has a rash in his groin.  Notices few days ago.  Some redness, itching and irritation.  No treatments tried.  No obvious aggravating or alleviating factors.  ROS: Per HPI  PMH: He reports that he quit smoking about 10 years ago. His smoking use included cigarettes. He has a 10.00 pack-year smoking history. He has never used smokeless tobacco. He reports that he does not drink alcohol or use drugs.      Objective:  Physical Exam: BP (!) 142/88   Pulse 77   Temp 97.7 F (36.5 C)   Ht 6\' 3"  (1.905 m)   Wt (!) 377 lb (171 kg)   SpO2 96%   BMI 47.12 kg/m   Wt Readings from Last 3 Encounters:  10/20/19 (!) 377 lb (171 kg)  06/16/19 (!)  384 lb (174.2 kg)  12/16/18 (!) 375 lb (170.1 kg)  Gen: NAD, resting comfortably CV: Regular rate and rhythm with no murmurs appreciated Pulm: Normal work of breathing, clear to auscultation bilaterally with no crackles, wheezes, or rhonchi GI: Normal bowel sounds present. Soft, Nontender, Nondistended. MSK: No edema, cyanosis, or clubbing noted Skin: Warm, dry.  Slightly erythematous rash noted in right inguinal area. Neuro: Grossly normal, moves all extremities Psych: Normal affect and thought content      Luis [name] M. [name], MD 10/20/2019 10:37 AM

## 2019-10-24 ENCOUNTER — Other Ambulatory Visit: Payer: Self-pay | Admitting: Family Medicine

## 2019-10-24 DIAGNOSIS — E876 Hypokalemia: Secondary | ICD-10-CM

## 2019-11-09 ENCOUNTER — Other Ambulatory Visit: Payer: Self-pay

## 2019-11-09 ENCOUNTER — Ambulatory Visit (INDEPENDENT_AMBULATORY_CARE_PROVIDER_SITE_OTHER): Payer: BC Managed Care – PPO | Admitting: Podiatry

## 2019-11-09 ENCOUNTER — Encounter: Payer: Self-pay | Admitting: Podiatry

## 2019-11-09 DIAGNOSIS — E1165 Type 2 diabetes mellitus with hyperglycemia: Secondary | ICD-10-CM | POA: Diagnosis not present

## 2019-11-09 DIAGNOSIS — B351 Tinea unguium: Secondary | ICD-10-CM | POA: Diagnosis not present

## 2019-11-09 DIAGNOSIS — Z794 Long term (current) use of insulin: Secondary | ICD-10-CM | POA: Diagnosis not present

## 2019-11-09 DIAGNOSIS — M79674 Pain in right toe(s): Secondary | ICD-10-CM

## 2019-11-09 DIAGNOSIS — M79675 Pain in left toe(s): Secondary | ICD-10-CM | POA: Diagnosis not present

## 2019-11-09 NOTE — Progress Notes (Signed)
Complaint:  Visit Type: Patient returns to my office for continued preventative foot care services. Complaint: Patient states" my nails have grown long and thick and become painful to walk and wear shoes" Patient has been diagnosed with DM with no foot complications. The patient presents for preventative foot care services. No changes to ROS  Podiatric Exam: Vascular: dorsalis pedis and posterior tibial pulses are palpable bilateral. Capillary return is immediate. Temperature gradient is WNL. Skin turgor WNL  Sensorium: Normal Semmes Weinstein monofilament test. Normal tactile sensation bilaterally. Nail Exam: Pt has thick disfigured discolored nails with subungual debris noted bilateral entire nail hallux through fifth toenails Ulcer Exam: There is no evidence of ulcer or pre-ulcerative changes or infection. Orthopedic Exam: Muscle tone and strength are WNL. No limitations in general ROM. No crepitus or effusions noted. Foot type and digits show no abnormalities. Bony prominences are unremarkable. Skin: No Porokeratosis. No infection or ulcers  Diagnosis:  Onychomycosis, , Pain in right toe, pain in left toes  Treatment & Plan Procedures and Treatment: Consent by patient was obtained for treatment procedures.   Debridement of mycotic and hypertrophic toenails, 1 through 5 bilateral and clearing of subungual debris. No ulceration, no infection noted.  Return Visit-Office Procedure: Patient instructed to return to the office for a follow up visit 3 months for continued evaluation and treatment.    Nolan Lasser DPM 

## 2019-11-10 ENCOUNTER — Ambulatory Visit (INDEPENDENT_AMBULATORY_CARE_PROVIDER_SITE_OTHER): Payer: BC Managed Care – PPO | Admitting: Family Medicine

## 2019-11-10 ENCOUNTER — Encounter: Payer: Self-pay | Admitting: Family Medicine

## 2019-11-10 VITALS — BP 150/94 | Temp 97.7°F | Ht 75.0 in | Wt 381.2 lb

## 2019-11-10 DIAGNOSIS — I872 Venous insufficiency (chronic) (peripheral): Secondary | ICD-10-CM | POA: Diagnosis not present

## 2019-11-10 DIAGNOSIS — I10 Essential (primary) hypertension: Secondary | ICD-10-CM

## 2019-11-10 DIAGNOSIS — Z794 Long term (current) use of insulin: Secondary | ICD-10-CM

## 2019-11-10 DIAGNOSIS — E1159 Type 2 diabetes mellitus with other circulatory complications: Secondary | ICD-10-CM | POA: Diagnosis not present

## 2019-11-10 DIAGNOSIS — E1165 Type 2 diabetes mellitus with hyperglycemia: Secondary | ICD-10-CM

## 2019-11-10 DIAGNOSIS — I152 Hypertension secondary to endocrine disorders: Secondary | ICD-10-CM

## 2019-11-10 LAB — GLUCOSE, POCT (MANUAL RESULT ENTRY): POC Glucose: 173 mg/dl — AB (ref 70–99)

## 2019-11-10 MED ORDER — OZEMPIC (0.25 OR 0.5 MG/DOSE) 2 MG/1.5ML ~~LOC~~ SOPN: 0.5000 mg | PEN_INJECTOR | SUBCUTANEOUS | 3 refills | Status: DC

## 2019-11-10 NOTE — Patient Instructions (Signed)
It was very nice to see you today!  Please try using Eucerin lotion to your legs twice daily.  Please increase your Ozempic to 0.5 mg weekly.  I will send in a new prescription for you.  Come back in 2 to 3 months, or sooner if needed.  Take care, Dr [name]  Please try these tips to maintain a healthy lifestyle:   Eat at least 3 REAL meals and 1-2 snacks per day.  Aim for no more than 5 hours between eating.  If you eat breakfast, please do so within one hour of getting up.    Each meal should contain half fruits/vegetables, one quarter protein, and one quarter carbs (no bigger than a computer mouse)   Cut down on sweet beverages. This includes juice, soda, and sweet tea.     Drink at least 1 glass of water with each meal and aim for at least 8 glasses per day   Exercise at least 150 minutes every week.

## 2019-11-10 NOTE — Progress Notes (Signed)
   Luis [name] is a 54 y.o. male who presents today for an office visit.  Assessment/Plan:  Chronic Problems Addressed Today: Hypertension associated with diabetes (HCC) Slightly above goal.  We will continue metoprolol tartrate 50 mg twice daily.  Venous stasis dermatitis of both lower extremities Recommended topical Eucerin twice daily.  Diabetes [facility]) Doing much better.  We will increase Ozempic to 0.5 mg weekly.  Continue insulin 70/30 22 units twice daily.  Follow-up in 2 to 3 months.    Subjective:  HPI:  Started on ozempic about a month ago. Has been tolerating well.  Average 146 over the past week.  He has also had some lower extremity dry skin.  Has been trying to use lotion without improvement.       Objective:  Physical Exam: BP (!) 150/94   Temp 97.7 F (36.5 C)   Ht 6\' 3"  (1.905 m)   Wt (!) 381 lb 3.2 oz (172.9 kg)   BMI 47.65 kg/m   Gen: No acute distress, resting comfortably CV: Regular rate and rhythm with no murmurs appreciated Pulm: Normal work of breathing, clear to auscultation bilaterally with no crackles, wheezes, or rhonchi Neuro: Grossly normal, moves all extremities MSK: Bilateral extremities with venous stasis changes. Psych: Normal affect and thought content      Luis [name] M. , MD 11/10/2019 10:35 AM

## 2019-11-10 NOTE — Assessment & Plan Note (Signed)
Slightly above goal.  We will continue metoprolol tartrate 50 mg twice daily.

## 2019-11-10 NOTE — Assessment & Plan Note (Signed)
Recommended topical Eucerin twice daily.

## 2019-11-10 NOTE — Assessment & Plan Note (Signed)
Doing much better.  We will increase Ozempic to 0.5 mg weekly.  Continue insulin 70/30 22 units twice daily.  Follow-up in 2 to 3 months.

## 2019-11-23 ENCOUNTER — Other Ambulatory Visit: Payer: Self-pay | Admitting: Family Medicine

## 2019-11-23 DIAGNOSIS — E876 Hypokalemia: Secondary | ICD-10-CM

## 2019-11-24 ENCOUNTER — Ambulatory Visit: Payer: BC Managed Care – PPO | Admitting: Family Medicine

## 2019-12-22 ENCOUNTER — Ambulatory Visit: Payer: BC Managed Care – PPO | Admitting: Family Medicine

## 2019-12-23 ENCOUNTER — Other Ambulatory Visit: Payer: Self-pay | Admitting: Family Medicine

## 2019-12-23 DIAGNOSIS — E782 Mixed hyperlipidemia: Secondary | ICD-10-CM

## 2020-01-09 ENCOUNTER — Other Ambulatory Visit: Payer: Self-pay | Admitting: Family Medicine

## 2020-01-09 DIAGNOSIS — I509 Heart failure, unspecified: Secondary | ICD-10-CM

## 2020-02-08 ENCOUNTER — Ambulatory Visit (INDEPENDENT_AMBULATORY_CARE_PROVIDER_SITE_OTHER): Payer: BC Managed Care – PPO | Admitting: Podiatry

## 2020-02-08 ENCOUNTER — Encounter: Payer: Self-pay | Admitting: Podiatry

## 2020-02-08 ENCOUNTER — Other Ambulatory Visit: Payer: Self-pay

## 2020-02-08 VITALS — Temp 97.2°F

## 2020-02-08 DIAGNOSIS — I872 Venous insufficiency (chronic) (peripheral): Secondary | ICD-10-CM

## 2020-02-08 DIAGNOSIS — M79675 Pain in left toe(s): Secondary | ICD-10-CM | POA: Diagnosis not present

## 2020-02-08 DIAGNOSIS — Z794 Long term (current) use of insulin: Secondary | ICD-10-CM

## 2020-02-08 DIAGNOSIS — E1165 Type 2 diabetes mellitus with hyperglycemia: Secondary | ICD-10-CM

## 2020-02-08 DIAGNOSIS — M79674 Pain in right toe(s): Secondary | ICD-10-CM

## 2020-02-08 DIAGNOSIS — B351 Tinea unguium: Secondary | ICD-10-CM

## 2020-02-08 NOTE — Progress Notes (Signed)
This patient returns to my office for at risk foot care.  This patient requires this care by a professional since this patient will be at risk due to having diabetes and venous stasis.  This patient is unable to cut nails himself since the patient cannot reach his nails.These nails are painful walking and wearing shoes.  This patient presents for at risk foot care today.  General Appearance  Alert, conversant and in no acute stress.  Vascular  Dorsalis pedis and posterior tibial  pulses are palpable  bilaterally.  Capillary return is within normal limits  bilaterally. Temperature is within normal limits  bilaterally.  Neurologic  Senn-Weinstein monofilament wire test within normal limits  bilaterally. Muscle power within normal limits bilaterally.  Nails Thick disfigured discolored nails with subungual debris  from hallux to fifth toes bilaterally. No evidence of bacterial infection or drainage bilaterally.  Orthopedic  No limitations of motion  feet .  No crepitus or effusions noted.  No bony pathology or digital deformities noted.  Skin  normotropic skin with no porokeratosis noted bilaterally.  No signs of infections or ulcers noted.     Onychomycosis  Pain in right toes  Pain in left toes  Consent was obtained for treatment procedures.   Mechanical debridement of nails 1-5  bilaterally performed with a nail nipper.  Filed with dremel without incident.    Return office visit   3 months                   Told patient to return for periodic foot care and evaluation due to potential at risk complications.   Kary Colaizzi DPM  

## 2020-02-09 ENCOUNTER — Ambulatory Visit (INDEPENDENT_AMBULATORY_CARE_PROVIDER_SITE_OTHER): Payer: BC Managed Care – PPO | Admitting: Family Medicine

## 2020-02-09 ENCOUNTER — Encounter: Payer: Self-pay | Admitting: Family Medicine

## 2020-02-09 VITALS — BP 144/82 | HR 84 | Temp 97.9°F | Ht 75.0 in | Wt 381.4 lb

## 2020-02-09 DIAGNOSIS — Z794 Long term (current) use of insulin: Secondary | ICD-10-CM | POA: Diagnosis not present

## 2020-02-09 DIAGNOSIS — E1165 Type 2 diabetes mellitus with hyperglycemia: Secondary | ICD-10-CM | POA: Diagnosis not present

## 2020-02-09 DIAGNOSIS — I509 Heart failure, unspecified: Secondary | ICD-10-CM

## 2020-02-09 DIAGNOSIS — R600 Localized edema: Secondary | ICD-10-CM

## 2020-02-09 LAB — POCT GLYCOSYLATED HEMOGLOBIN (HGB A1C): Hemoglobin A1C: 6.4 % — AB (ref 4.0–5.6)

## 2020-02-09 MED ORDER — NOVOLOG MIX 70/30 FLEXPEN (70-30) 100 UNIT/ML ~~LOC~~ SUPN: 4.0000 [IU] | PEN_INJECTOR | Freq: Two times a day (BID) | SUBCUTANEOUS | 11 refills | Status: DC

## 2020-02-09 MED ORDER — TORSEMIDE 10 MG PO TABS: 20.0000 mg | ORAL_TABLET | Freq: Every day | ORAL | 2 refills | Status: DC | PRN

## 2020-02-09 NOTE — Patient Instructions (Signed)
It was very nice to see you today!  Your blood sugar looks great.  We will continue your current dose of Ozempic and insulin.  Please try taking both tablets of your torsemide early in the afternoon see if this helps with your swelling.  Come back to see me in 6 months, or sooner if needed.  Take care, Dr [name]  Please try these tips to maintain a healthy lifestyle:   Eat at least 3 REAL meals and 1-2 snacks per day.  Aim for no more than 5 hours between eating.  If you eat breakfast, please do so within one hour of getting up.    Each meal should contain half fruits/vegetables, one quarter protein, and one quarter carbs (no bigger than a computer mouse)   Cut down on sweet beverages. This includes juice, soda, and sweet tea.     Drink at least 1 glass of water with each meal and aim for at least 8 glasses per day   Exercise at least 150 minutes every week.

## 2020-02-09 NOTE — Progress Notes (Signed)
   Luis [name] is a 54 y.o. male who presents today for an office visit.  Assessment/Plan:  Chronic Problems Addressed Today: Diabetes (HCC) A1c improved to 6.4.  We will continue Ozempic 0.5 mg weekly.  Continue insulin 70/30 24 units twice daily.  Follow-up in 6 months.  Bilateral edema of lower extremity Discussed lifestyle modifications.  Advised him to take torsemide 20 mg daily as needed instead of splitting up the dose.  He will let us know if not improving in the next few weeks.     Subjective:  HPI:  See a/p.        Objective:  Physical Exam: BP (!) 144/82   Pulse 84   Temp 97.9 F (36.6 C)   Ht 6\' 3"  (1.905 m)   Wt (!) 381 lb 6.1 oz (173 kg)   SpO2 95%   BMI 47.67 kg/m   Wt Readings from Last 3 Encounters:  02/09/20 (!) 381 lb 6.1 oz (173 kg)  11/10/19 (!) 381 lb 3.2 oz (172.9 kg)  10/20/19 (!) 377 lb (171 kg)  Gen: No acute distress, resting comfortably CV: Regular rate and rhythm with no murmurs appreciated Pulm: Normal work of breathing, clear to auscultation bilaterally with no crackles, wheezes, or rhonchi Neuro: Grossly normal, moves all extremities Psych: Normal affect and thought content      Luis [name] M. 10/22/19, MD 02/09/2020 10:13 AM

## 2020-02-09 NOTE — Assessment & Plan Note (Signed)
A1c improved to 6.4.  We will continue Ozempic 0.5 mg weekly.  Continue insulin 70/30 24 units twice daily.  Follow-up in 6 months.

## 2020-02-09 NOTE — Assessment & Plan Note (Signed)
Discussed lifestyle modifications.  Advised him to take torsemide 20 mg daily as needed instead of splitting up the dose.  He will let us know if not improving in the next few weeks.

## 2020-02-18 ENCOUNTER — Other Ambulatory Visit: Payer: Self-pay | Admitting: Family Medicine

## 2020-02-18 DIAGNOSIS — E876 Hypokalemia: Secondary | ICD-10-CM

## 2020-04-08 ENCOUNTER — Other Ambulatory Visit: Payer: Self-pay | Admitting: Family Medicine

## 2020-04-08 DIAGNOSIS — I509 Heart failure, unspecified: Secondary | ICD-10-CM

## 2020-04-10 ENCOUNTER — Other Ambulatory Visit: Payer: Self-pay

## 2020-04-10 ENCOUNTER — Ambulatory Visit (INDEPENDENT_AMBULATORY_CARE_PROVIDER_SITE_OTHER): Payer: BC Managed Care – PPO | Admitting: Family Medicine

## 2020-04-10 VITALS — BP 126/82 | HR 90 | Temp 97.2°F | Ht 75.0 in | Wt 367.2 lb

## 2020-04-10 DIAGNOSIS — E1165 Type 2 diabetes mellitus with hyperglycemia: Secondary | ICD-10-CM | POA: Diagnosis not present

## 2020-04-10 DIAGNOSIS — Z794 Long term (current) use of insulin: Secondary | ICD-10-CM | POA: Diagnosis not present

## 2020-04-10 DIAGNOSIS — F4329 Adjustment disorder with other symptoms: Secondary | ICD-10-CM | POA: Diagnosis not present

## 2020-04-10 DIAGNOSIS — F4320 Adjustment disorder, unspecified: Secondary | ICD-10-CM | POA: Insufficient documentation

## 2020-04-10 MED ORDER — CITALOPRAM HYDROBROMIDE 20 MG PO TABS: 20.0000 mg | ORAL_TABLET | Freq: Every day | ORAL | 3 refills | Status: DC

## 2020-04-10 NOTE — Assessment & Plan Note (Signed)
Discussed treatment options.  We will start Celexa 20 mg daily.  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  He will follow-up with me in a couple weeks via MyChart or phone call.

## 2020-04-10 NOTE — Progress Notes (Signed)
   Luis [name] is a 54 y.o. male who presents today for an office visit.  Assessment/Plan:  Chronic Problems Addressed Today: Adjustment reaction Discussed treatment options.  We will start Celexa 20 mg daily.  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  He will follow-up with me in a couple weeks via MyChart or phone call.  Diabetes (HCC) Continue Ozempic 0.5 mg weekly and insulin 70/30 24 units twice daily.  We will recheck A1c at next visit in about 4 months.    Subjective:  HPI:  Patient has been under quite a bit of stress recently.  His mother had open heart surgery on April 30.  He has been out of work since then.  He has had difficulty managing his stress.  He feels very overwhelmed.  He has been her primary caregiver.  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  He does not think that he will be able to adequately perform his job due to the level stress that he is under.  He has never been on anything for stress in the past.       Objective:  Physical Exam: BP 126/82   Pulse 90   Temp (!) 97.2 F (36.2 C)   Ht 6\' 3"  (1.905 m)   Wt (!) 367 lb 3.2 oz (166.6 kg)   SpO2 97%   BMI 45.90 kg/m   Wt Readings from Last 3 Encounters:  04/10/20 (!) 367 lb 3.2 oz (166.6 kg)  02/09/20 (!) 381 lb 6.1 oz (173 kg)  11/10/19 (!) 381 lb 3.2 oz (172.9 kg)  Gen: No acute distress, resting comfortably CV: Regular rate and rhythm with no murmurs appreciated Pulm: Normal work of breathing, clear to auscultation bilaterally with no crackles, wheezes, or rhonchi Neuro: Grossly normal, moves all extremities Psych: Normal affect and thought content      Luis [name] M. 01/08/20, MD 04/10/2020 3:43 PM

## 2020-04-10 NOTE — Assessment & Plan Note (Signed)
Continue Ozempic 0.5 mg weekly and insulin 70/30 24 units twice daily.  We will recheck A1c at next visit in about 4 months.

## 2020-04-10 NOTE — Patient Instructions (Signed)
It was very nice to see you today!  You can have your HR department and send [place] any forms if needed.  Please start the Celexa.  Please give me a call in a couple weeks to let me know how it is working for you.  I will see you back in October for your next checkup.  Take care, Dr [name]  Please try these tips to maintain a healthy lifestyle:   Eat at least 3 REAL meals and 1-2 snacks per day.  Aim for no more than 5 hours between eating.  If you eat breakfast, please do so within one hour of getting up.    Each meal should contain half fruits/vegetables, one quarter protein, and one quarter carbs (no bigger than a computer mouse)   Cut down on sweet beverages. This includes juice, soda, and sweet tea.     Drink at least 1 glass of water with each meal and aim for at least 8 glasses per day   Exercise at least 150 minutes every week.

## 2020-04-11 ENCOUNTER — Telehealth: Payer: Self-pay | Admitting: Family Medicine

## 2020-04-11 NOTE — Telephone Encounter (Signed)
Patient's insurance company is calling in asking if we have received a form they sent yesterday.

## 2020-04-12 NOTE — Telephone Encounter (Signed)
Notified patient form received

## 2020-04-12 NOTE — Telephone Encounter (Signed)
Patient called to speak to [facility]

## 2020-04-13 NOTE — Telephone Encounter (Signed)
Pt notified paperwork received  Was out of work since 02/29/2020 returning 05/28/2020

## 2020-04-17 NOTE — Telephone Encounter (Signed)
Pt called following up on Short Term Disability paperwork. Pt needs progress notes and dates that he has been out of work. Pt asked if we could fax to [phone].  Claim # [id number].

## 2020-04-17 NOTE — Telephone Encounter (Signed)
Left voice message for patient to call clinic.  

## 2020-04-27 NOTE — Telephone Encounter (Signed)
Form was faxed on

## 2020-05-09 ENCOUNTER — Encounter: Payer: Self-pay | Admitting: Podiatry

## 2020-05-09 ENCOUNTER — Ambulatory Visit (INDEPENDENT_AMBULATORY_CARE_PROVIDER_SITE_OTHER): Payer: BC Managed Care – PPO | Admitting: Podiatry

## 2020-05-09 ENCOUNTER — Other Ambulatory Visit: Payer: Self-pay

## 2020-05-09 ENCOUNTER — Telehealth: Payer: Self-pay | Admitting: Family Medicine

## 2020-05-09 DIAGNOSIS — M79675 Pain in left toe(s): Secondary | ICD-10-CM

## 2020-05-09 DIAGNOSIS — M79674 Pain in right toe(s): Secondary | ICD-10-CM

## 2020-05-09 DIAGNOSIS — Q828 Other specified congenital malformations of skin: Secondary | ICD-10-CM | POA: Diagnosis not present

## 2020-05-09 DIAGNOSIS — Z794 Long term (current) use of insulin: Secondary | ICD-10-CM | POA: Diagnosis not present

## 2020-05-09 DIAGNOSIS — I872 Venous insufficiency (chronic) (peripheral): Secondary | ICD-10-CM

## 2020-05-09 DIAGNOSIS — E1165 Type 2 diabetes mellitus with hyperglycemia: Secondary | ICD-10-CM

## 2020-05-09 DIAGNOSIS — B351 Tinea unguium: Secondary | ICD-10-CM | POA: Diagnosis not present

## 2020-05-09 NOTE — Telephone Encounter (Signed)
Noted  

## 2020-05-09 NOTE — Progress Notes (Signed)
This patient returns to my office for at risk foot care.  This patient requires this care by a professional since this patient will be at risk due to having diabetes and venous stasis.  This patient is unable to cut nails himself since the patient cannot reach his nails.These nails are painful walking and wearing shoes.  Patient has callus forming on the bottom of right foot which is not painful.  This patient presents for at risk foot care today.  General Appearance  Alert, conversant and in no acute stress.  Vascular  Dorsalis pedis and posterior tibial  pulses are palpable  bilaterally.  Capillary return is within normal limits  bilaterally. Temperature is within normal limits  bilaterally.  Neurologic  Senn-Weinstein monofilament wire test within normal limits  bilaterally. Muscle power within normal limits bilaterally.  Nails Thick disfigured discolored nails with subungual debris  from hallux to fifth toes bilaterally. No evidence of bacterial infection or drainage bilaterally.  Orthopedic  No limitations of motion  feet .  No crepitus or effusions noted.  No bony pathology or digital deformities noted.  Skin  normotropic skin with no porokeratosis noted bilaterally.  No signs of infections or ulcers noted.     Onychomycosis  Pain in right toes  Pain in left toes  Consent was obtained for treatment procedures.   Mechanical debridement of nails 1-5  bilaterally performed with a nail nipper.  Filed with dremel without incident.    Return office visit   3 months                   Told patient to return for periodic foot care and evaluation due to potential at risk complications.   Helane Gunther DPM

## 2020-05-09 NOTE — Telephone Encounter (Signed)
Patient called and stated he needed an extended work note from 6/8- 9/7  13 weeks total he scheduled a video visit for 7/8 to talk about this with dr.parker

## 2020-05-10 ENCOUNTER — Telehealth (INDEPENDENT_AMBULATORY_CARE_PROVIDER_SITE_OTHER): Payer: BC Managed Care – PPO | Admitting: Family Medicine

## 2020-05-10 ENCOUNTER — Encounter: Payer: Self-pay | Admitting: Family Medicine

## 2020-05-10 ENCOUNTER — Telehealth: Payer: Self-pay | Admitting: Family Medicine

## 2020-05-10 VITALS — BP 167/95 | HR 96 | Temp 96.8°F | Ht 75.0 in

## 2020-05-10 DIAGNOSIS — E1165 Type 2 diabetes mellitus with hyperglycemia: Secondary | ICD-10-CM

## 2020-05-10 DIAGNOSIS — F4329 Adjustment disorder with other symptoms: Secondary | ICD-10-CM

## 2020-05-10 DIAGNOSIS — Z794 Long term (current) use of insulin: Secondary | ICD-10-CM

## 2020-05-10 DIAGNOSIS — I10 Essential (primary) hypertension: Secondary | ICD-10-CM | POA: Diagnosis not present

## 2020-05-10 DIAGNOSIS — I152 Hypertension secondary to endocrine disorders: Secondary | ICD-10-CM

## 2020-05-10 DIAGNOSIS — E1159 Type 2 diabetes mellitus with other circulatory complications: Secondary | ICD-10-CM

## 2020-05-10 MED ORDER — CITALOPRAM HYDROBROMIDE 40 MG PO TABS: 40.0000 mg | ORAL_TABLET | Freq: Every day | ORAL | 5 refills | Status: DC

## 2020-05-10 NOTE — Assessment & Plan Note (Signed)
Symptoms are still uncontrolled.  Will increase Celexa to 40 mg daily.  If still no improvement will consider trial of alternative SSRI such as Lexapro or Zoloft.  Do not think it is suitable for him to go back to work at this point.  We will give him letter stating he should go back to work on July 10, 2020.  This will be 13 weeks after his initial visit with me last month regarding this issue.

## 2020-05-10 NOTE — Telephone Encounter (Signed)
Patient called in with the fax number - [phone] for his short term disability

## 2020-05-10 NOTE — Assessment & Plan Note (Addendum)
Patient will come in soon for CPE and we will check blood pressure at that time.  He will continue metoprolol 50 mg twice daily.

## 2020-05-10 NOTE — Assessment & Plan Note (Signed)
Continue Ozempic 0.5 mg weekly and insulin 70/30 24 units twice daily.  He will come in in about 3 months for CPE and we will recheck A1c at that time.

## 2020-05-10 NOTE — Progress Notes (Signed)
   Luis [name] is a 54 y.o. male who presents today for a virtual office visit.  Assessment/Plan:  Chronic Problems Addressed Today: Adjustment reaction Symptoms are still uncontrolled.  Will increase Celexa to 40 mg daily.  If still no improvement will consider trial of alternative SSRI such as Lexapro or Zoloft.  Do not think it is suitable for him to go back to work at this point.  We will give him letter stating he should go back to work on July 10, 2020.  This will be 13 weeks after his initial visit with me last month regarding this issue.  Hypertension associated with diabetes [facility]) Patient will come in soon for CPE and we will check blood pressure at that time.  He will continue metoprolol 50 mg twice daily.  Diabetes (HCC) Continue Ozempic 0.5 mg weekly and insulin 70/30 24 units twice daily.  He will come in in about 3 months for CPE and we will recheck A1c at that time.     Subjective:  HPI:  Patient here for follow-up for depression anxiety and adjustment disorder.  He has not had his last office visit.  He has been under increased stress recently mostly due to being the primary caregiver for his mother.  She underwent open heart surgery on April 30.  He has been out of work since then.  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  At his last office visit we started him on Celexa 20 mg daily.  He has not noticed significant improvement with this but has not noticed any side effects either.         Objective/Observations  Physical Exam: Gen: NAD, resting comfortably Pulm: Normal work of breathing Neuro: Grossly normal, moves all extremities Psych: Normal affect and thought content  Virtual Visit via Video   I connected with Luis [name] on 05/10/20 at  3:40 PM EDT by a video enabled telemedicine  application and verified that I am speaking with the correct person using two identifiers. The limitations of evaluation and management by telemedicine and the availability of in person appointments were discussed. The patient expressed understanding and agreed to proceed.   Patient location: Home Provider location: [place] [employer] Persons participating in the virtual visit: Myself and Patient     [name]. [name], MD 05/10/2020 3:52 PM

## 2020-05-14 NOTE — Telephone Encounter (Signed)
Spoke with patient waiting for forms to be faxed

## 2020-05-14 NOTE — Telephone Encounter (Signed)
FMLA COMPLETED

## 2020-05-17 ENCOUNTER — Other Ambulatory Visit: Payer: Self-pay | Admitting: Family Medicine

## 2020-05-22 ENCOUNTER — Telehealth: Payer: Self-pay | Admitting: Family Medicine

## 2020-05-22 NOTE — Telephone Encounter (Signed)
Patient called and stated the notes from his video visit appointment be faxed as well. company requested appointment notes as well  To [phone]

## 2020-05-22 NOTE — Telephone Encounter (Signed)
Office notes were faxed with previous forms. Forms resent.

## 2020-05-24 NOTE — Telephone Encounter (Signed)
Patient called and requested we send over all the previous information we sent already, but add the claim number to the information so it can be filed correctly he was not aware of this prior  Claim # [id number]

## 2020-05-24 NOTE — Telephone Encounter (Signed)
Left voice message for patient to call clinic.  

## 2020-05-25 NOTE — Telephone Encounter (Signed)
Spoke with patient notified faxed with claim #.Informed patient to call if any other issues or concerns.

## 2020-05-25 NOTE — Telephone Encounter (Signed)
Patient called, please return patient's call

## 2020-07-05 ENCOUNTER — Other Ambulatory Visit: Payer: Self-pay | Admitting: Family Medicine

## 2020-07-05 DIAGNOSIS — I509 Heart failure, unspecified: Secondary | ICD-10-CM

## 2020-07-30 ENCOUNTER — Other Ambulatory Visit: Payer: Self-pay | Admitting: Family Medicine

## 2020-07-30 DIAGNOSIS — E876 Hypokalemia: Secondary | ICD-10-CM

## 2020-08-04 ENCOUNTER — Other Ambulatory Visit: Payer: Self-pay | Admitting: Family Medicine

## 2020-08-04 DIAGNOSIS — E782 Mixed hyperlipidemia: Secondary | ICD-10-CM

## 2020-08-07 ENCOUNTER — Ambulatory Visit: Payer: BC Managed Care – PPO | Admitting: Podiatry

## 2020-08-09 ENCOUNTER — Ambulatory Visit: Payer: BC Managed Care – PPO | Admitting: Family Medicine

## 2020-08-10 ENCOUNTER — Ambulatory Visit (INDEPENDENT_AMBULATORY_CARE_PROVIDER_SITE_OTHER): Payer: BC Managed Care – PPO | Admitting: Podiatry

## 2020-08-10 ENCOUNTER — Encounter: Payer: Self-pay | Admitting: Podiatry

## 2020-08-10 ENCOUNTER — Ambulatory Visit: Payer: BC Managed Care – PPO | Admitting: Podiatry

## 2020-08-10 ENCOUNTER — Other Ambulatory Visit: Payer: Self-pay

## 2020-08-10 DIAGNOSIS — B351 Tinea unguium: Secondary | ICD-10-CM

## 2020-08-10 DIAGNOSIS — E1165 Type 2 diabetes mellitus with hyperglycemia: Secondary | ICD-10-CM

## 2020-08-10 DIAGNOSIS — M79674 Pain in right toe(s): Secondary | ICD-10-CM

## 2020-08-10 DIAGNOSIS — M79675 Pain in left toe(s): Secondary | ICD-10-CM | POA: Diagnosis not present

## 2020-08-10 DIAGNOSIS — I872 Venous insufficiency (chronic) (peripheral): Secondary | ICD-10-CM | POA: Diagnosis not present

## 2020-08-10 DIAGNOSIS — Z794 Long term (current) use of insulin: Secondary | ICD-10-CM

## 2020-08-10 NOTE — Progress Notes (Signed)
This patient returns to my office for at risk foot care.  This patient requires this care by a professional since this patient will be at risk due to having diabetes and venous stasis.  This patient is unable to cut nails himself since the patient cannot reach his nails.These nails are painful walking and wearing shoes.  This patient presents for at risk foot care today.  General Appearance  Alert, conversant and in no acute stress.  Vascular  Dorsalis pedis and posterior tibial  pulses are palpable  bilaterally.  Capillary return is within normal limits  bilaterally. Temperature is within normal limits  bilaterally.  Neurologic  Senn-Weinstein monofilament wire test within normal limits  bilaterally. Muscle power within normal limits bilaterally.  Nails Thick disfigured discolored nails with subungual debris  from hallux to fifth toes bilaterally. No evidence of bacterial infection or drainage bilaterally.  Orthopedic  No limitations of motion  feet .  No crepitus or effusions noted.  No bony pathology or digital deformities noted.  Skin  normotropic skin with no porokeratosis noted bilaterally.  No signs of infections or ulcers noted.     Onychomycosis  Pain in right toes  Pain in left toes  Consent was obtained for treatment procedures.   Mechanical debridement of nails 1-5  bilaterally performed with a nail nipper.  Filed with dremel without incident.    Return office visit   3 months                   Told patient to return for periodic foot care and evaluation due to potential at risk complications.   Aaron Boeh DPM  

## 2020-08-23 ENCOUNTER — Ambulatory Visit (INDEPENDENT_AMBULATORY_CARE_PROVIDER_SITE_OTHER): Payer: BC Managed Care – PPO | Admitting: Family Medicine

## 2020-08-23 ENCOUNTER — Encounter: Payer: Self-pay | Admitting: Family Medicine

## 2020-08-23 ENCOUNTER — Other Ambulatory Visit: Payer: Self-pay

## 2020-08-23 VITALS — BP 130/76 | HR 78 | Temp 97.8°F | Ht 75.0 in | Wt 366.0 lb

## 2020-08-23 DIAGNOSIS — F4329 Adjustment disorder with other symptoms: Secondary | ICD-10-CM

## 2020-08-23 DIAGNOSIS — Z0001 Encounter for general adult medical examination with abnormal findings: Secondary | ICD-10-CM

## 2020-08-23 DIAGNOSIS — Z1211 Encounter for screening for malignant neoplasm of colon: Secondary | ICD-10-CM | POA: Diagnosis not present

## 2020-08-23 DIAGNOSIS — Z23 Encounter for immunization: Secondary | ICD-10-CM

## 2020-08-23 DIAGNOSIS — E1159 Type 2 diabetes mellitus with other circulatory complications: Secondary | ICD-10-CM | POA: Diagnosis not present

## 2020-08-23 DIAGNOSIS — E1169 Type 2 diabetes mellitus with other specified complication: Secondary | ICD-10-CM | POA: Diagnosis not present

## 2020-08-23 DIAGNOSIS — Z794 Long term (current) use of insulin: Secondary | ICD-10-CM

## 2020-08-23 DIAGNOSIS — E1165 Type 2 diabetes mellitus with hyperglycemia: Secondary | ICD-10-CM | POA: Diagnosis not present

## 2020-08-23 DIAGNOSIS — E785 Hyperlipidemia, unspecified: Secondary | ICD-10-CM

## 2020-08-23 DIAGNOSIS — I5032 Chronic diastolic (congestive) heart failure: Secondary | ICD-10-CM

## 2020-08-23 DIAGNOSIS — N289 Disorder of kidney and ureter, unspecified: Secondary | ICD-10-CM

## 2020-08-23 DIAGNOSIS — I152 Hypertension secondary to endocrine disorders: Secondary | ICD-10-CM

## 2020-08-23 LAB — POCT GLYCOSYLATED HEMOGLOBIN (HGB A1C): Hemoglobin A1C: 6 % — AB (ref 4.0–5.6)

## 2020-08-23 NOTE — Assessment & Plan Note (Signed)
Check CMET. 

## 2020-08-23 NOTE — Assessment & Plan Note (Signed)
Stable.  No signs of volume overload.  Continue torsemide as needed.

## 2020-08-23 NOTE — Assessment & Plan Note (Signed)
Check lipid panel. Continue simvastatin 20 mg daily. 

## 2020-08-23 NOTE — Assessment & Plan Note (Signed)
At goal.  ContinueMetoprolol 50 mg twice daily.

## 2020-08-23 NOTE — Progress Notes (Signed)
Chief Complaint:  Luis [name] is a 54 y.o. male who presents today for his annual comprehensive physical exam.    Assessment/Plan:  Chronic Problems Addressed Today: Chronic diastolic CHF (congestive heart failure) (HCC), ECHO 03/2017 EF 50-55%, on Torsemide Stable.  No signs of volume overload.  Continue torsemide as needed.  Hypertension associated with diabetes (HCC) At goal.  ContinueMetoprolol 50 mg twice daily.  Adjustment reaction Recently lost mother. Is still dealing with grieving process.  Celexa seems to be helping.  We will continue current dose.  Diabetes (HCC) A1c 6.0.  Congratulated patient.  Continue Ozempic 0.5 mg weekly and insulin 70/30 mix 40 minutes twice daily.  Renal insufficiency Check CMET.  Hyperlipidemia associated with type 2 diabetes mellitus (HCC) Check lipid panel.  Continue simvastatin 20 mg daily.  Preventative Healthcare: Flu vaccine given today.  Place referral for colonoscopy.  Check CBC, CMET, TSH, A1c.  Up-to-date on Covid vaccine.  Check urine microalbumin.   Patient Counseling(The following topics were reviewed and/or handout was given):  -Nutrition: Stressed importance of moderation in sodium/caffeine intake, saturated fat and cholesterol, caloric balance, sufficient intake of fresh fruits, vegetables, and fiber.  -Stressed the importance of regular exercise.   -Substance Abuse: Discussed cessation/primary prevention of tobacco, alcohol, or other drug use; driving or other dangerous activities under the influence; availability of treatment for abuse.   -Injury prevention: Discussed safety belts, safety helmets, smoke detector, smoking near bedding or upholstery.   -Sexuality: Discussed sexually transmitted diseases, partner selection, use of condoms, avoidance of unintended pregnancy and contraceptive alternatives.   -Dental health: Discussed importance of regular tooth brushing, flossing, and dental visits.  -Health maintenance  and immunizations reviewed. Please refer to Health maintenance section.  Return to care in 1 year for next preventative visit.     Subjective:  HPI:  He has no acute complaints today.   Lifestyle Diet: None specific.  Exercise: Limited.   Depression screen PHQ 2/9 04/11/2020  Decreased Interest 3  Down, Depressed, Hopeless 3  PHQ - 2 Score 6  Altered sleeping 3  Tired, decreased energy 3  Change in appetite 3  Feeling bad or failure about yourself  3  Trouble concentrating 3  Moving slowly or fidgety/restless 3  Suicidal thoughts 0  PHQ-9 Score 24  Difficult doing work/chores Extremely dIfficult    Health Maintenance Due  Topic Date Due  . Hepatitis C Screening  Never done  . OPHTHALMOLOGY EXAM  04/15/2019  . URINE MICROALBUMIN  12/17/2019  . INFLUENZA VACCINE  06/03/2020     ROS: Per HPI, otherwise a complete review of systems was negative.   PMH:  The following were reviewed and entered/updated in epic: Past Medical History:  Diagnosis Date  . Chronic combined systolic and diastolic CHF (congestive heart failure) (HCC)   . DM (diabetes mellitus) (HCC)   . Essential hypertension   . History of tobacco abuse   . HTN (hypertension)   . Hyperlipidemia   . Mild CAD   . Morbid obesity (HCC)   . NICM (nonischemic cardiomyopathy) (HCC)   . Noncompliance   . OSA (obstructive sleep apnea)   . Pneumonia 2017  . Premature atrial contractions    Patient Active Problem List   Diagnosis Date Noted  . 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  . Adjustment reaction 04/10/2020  . Venous stasis dermatitis of both lower extremities 11/10/2019  . Pain due to onychomycosis of toenails of both feet 04/29/2019  . Diabetes (HCC) 02/02/2018  . Compliance poor, due  to mulitple factors 01/02/2018  . Renal insufficiency 01/01/2018  . Onychomycosis, followed by Podiatry 06/14/2017  . Bilateral edema of lower extremity 06/14/2017  . Hyperlipidemia associated with type 2 diabetes mellitus (HCC)    . Difficulty walking, debility, deconditioned 06/11/2017  . OSA (obstructive sleep apnea), mild, not using CPAP 03/31/2017  . NICM (nonischemic cardiomyopathy) (HCC), left heart cath and coronary angiography 2017   . Former smoker   . Chronic diastolic CHF (congestive heart failure) (HCC), ECHO 03/2017 EF 50-55%, on Torsemide   . Hypertension associated with diabetes (HCC) 03/19/2016  . Paroxysmal atrial fibrillation (HCC) 03/19/2016   Past Surgical History:  Procedure Laterality Date  . CARDIAC CATHETERIZATION N/A 03/24/2016   Procedure: Left Heart Cath and Coronary Angiography;  Surgeon: Marykay Lex, MD;  Location[facility] INVASIVE CV LAB;  Service: Cardiovascular;  Laterality: N/A;  . FEMUR IM NAIL Right 2012  . FRACTURE SURGERY      Family History  Problem Relation Age of Onset  . Diabetes Mellitus II Mother   . Diabetes type II Other   . Hypertension Maternal Grandfather   . Stroke Neg Hx   . Cancer Neg Hx     Medications- reviewed and updated Current Outpatient Medications  Medication Sig Dispense Refill  . aspirin EC 81 MG EC tablet Take 1 tablet (81 mg total) by mouth daily. 30 tablet 0  . citalopram (CELEXA) 20 MG tablet Take 20 mg by mouth daily.    . insulin aspart protamine - aspart (NOVOLOG MIX 70/30 FLEXPEN) (70-30) 100 UNIT/ML FlexPen Inject 0.04 mLs (4 Units total) into the skin 2 (two) times daily. 15 mL 11  . Insulin Pen Needle 31G X 8 MM MISC Use to inject insulin twice daily 100 each 11  . Insulin Pen Needle 32G X 6 MM MISC Use to administer insulin two times daily 90 each 11  . metoprolol tartrate (LOPRESSOR) 50 MG tablet TAKE 1 TABLET(50 MG) BY MOUTH TWICE DAILY 180 tablet 3  . potassium chloride SA (KLOR-CON) 20 MEQ tablet TAKE 1 TABLET(20 MEQ) BY MOUTH DAILY 30 tablet 2  . Semaglutide,0.25 or 0.5MG /DOS, (OZEMPIC, 0.25 OR 0.5 MG/DOSE,) 2 MG/1.5ML SOPN Inject 0.5 mg into the skin once a week. 6 pen 3  . simvastatin (ZOCOR) 20 MG tablet TAKE 1 TABLET(20 MG) BY  MOUTH AT BEDTIME 90 tablet 1  . torsemide (DEMADEX) 10 MG tablet TAKE 1 TO 2 TABLETS BY MOUTH DAILY FOR SWELLING 60 tablet 2   No current facility-administered medications for this visit.    Allergies-reviewed and updated Allergies  Allergen Reactions  . Demerol [Meperidine] Itching  . Morphine And Related Itching    Social History   Socioeconomic History  . Marital status: Single    Spouse name: Not on file  . Number of children: 1  . Years of education: 37  . Highest education level: Not on file  Occupational History  . Occupation: [employer]  Tobacco Use  . Smoking status: Former Smoker    Packs/day: 0.50    Years: 20.00    Pack years: 10.00    Types: Cigarettes    Quit date: 03/02/2009    Years since quitting: 11.4  . Smokeless tobacco: Never Used  Vaping Use  . Vaping Use: Never used  Substance and Sexual Activity  . Alcohol use: No  . Drug use: No  . Sexual activity: Never  Other Topics Concern  . Not on file  Social History Narrative   ** Merged History Encounter **  Lives in [place] with mother and three grandchildren.     Works for [employer] - works from home.     Does not routinely exercise.   Caffeine use: none   Fun/Hobby: Play video games and play with grandchildren.    Social Determinants of Health   Financial Resource Strain:   . Difficulty of Paying Living Expenses: Not on file  Food Insecurity:   . Worried About Programme researcher, broadcasting/film/video in the Last Year: Not on file  . Ran Out of Food in the Last Year: Not on file  Transportation Needs:   . Lack of Transportation (Medical): Not on file  . Lack of Transportation (Non-Medical): Not on file  Physical Activity:   . Days of Exercise per Week: Not on file  . Minutes of Exercise per Session: Not on file  Stress:   . Feeling of Stress : Not on file  Social Connections:   . Frequency of Communication with Friends and Family: Not on file  . Frequency of Social Gatherings with Friends  and Family: Not on file  . Attends Religious Services: Not on file  . Active Member of Clubs or Organizations: Not on file  . Attends Banker Meetings: Not on file  . Marital Status: Not on file        Objective:  Physical Exam: BP 130/76   Pulse 78   Temp 97.8 F (36.6 C) (Temporal)   Ht 6\' 3"  (1.905 m)   Wt (!) 366 lb (166 kg)   SpO2 98%   BMI 45.75 kg/m   Body mass index is 45.75 kg/m. Wt Readings from Last 3 Encounters:  08/23/20 (!) 366 lb (166 kg)  04/10/20 (!) 367 lb 3.2 oz (166.6 kg)  02/09/20 (!) 381 lb 6.1 oz (173 kg)   Gen: NAD, resting comfortably HEENT: TMs normal bilaterally. OP clear. No thyromegaly noted.  CV: RRR with no murmurs appreciated Pulm: NWOB, CTAB with no crackles, wheezes, or rhonchi GI: Normal bowel sounds present. Soft, Nontender, Nondistended. MSK: no edema, cyanosis, or clubbing noted Skin: warm, dry Neuro: CN2-12 grossly intact. Strength 5/5 in upper and lower extremities. Reflexes symmetric and intact bilaterally.  Psych: Normal affect and thought content     Moranda Billiot M. 04/10/20, MD 08/23/2020 9:59 AM

## 2020-08-23 NOTE — Assessment & Plan Note (Signed)
A1c 6.0.  Congratulated patient.  Continue Ozempic 0.5 mg weekly and insulin 70/30 mix 40 minutes twice daily.

## 2020-08-23 NOTE — Assessment & Plan Note (Addendum)
Recently lost mother. Is still dealing with grieving process.  Celexa seems to be helping.  We will continue current dose.

## 2020-08-23 NOTE — Patient Instructions (Signed)
It was very nice to see you today!  Your blood sugar looks great today.  Please keep up the good work.  No changes to medications today.  We will give your flu vaccine today.  We will check blood work and a urine sample today.  You will get called about scheduling your colonoscopy within the next 1 to 2 weeks.  Please come back to see me in 6 months.  Please come back to see me sooner if needed.  Take care, Dr [name]  Please try these tips to maintain a healthy lifestyle:   Eat at least 3 REAL meals and 1-2 snacks per day.  Aim for no more than 5 hours between eating.  If you eat breakfast, please do so within one hour of getting up.    Each meal should contain half fruits/vegetables, one quarter protein, and one quarter carbs (no bigger than a computer mouse)   Cut down on sweet beverages. This includes juice, soda, and sweet tea.     Drink at least 1 glass of water with each meal and aim for at least 8 glasses per day   Exercise at least 150 minutes every week.    Preventive Care 64-76 Years Old, Male Preventive care refers to lifestyle choices and visits with your health care provider that can promote health and wellness. This includes:  A yearly physical exam. This is also called an annual well check.  Regular dental and eye exams.  Immunizations.  Screening for certain conditions.  Healthy lifestyle choices, such as eating a healthy diet, getting regular exercise, not using drugs or products that contain nicotine and tobacco, and limiting alcohol use. What can I expect for my preventive care visit? Physical exam Your health care provider will check:  Height and weight. These may be used to calculate body mass index (BMI), which is a measurement that tells if you are at a healthy weight.  Heart rate and blood pressure.  Your skin for abnormal spots. Counseling Your health care provider may ask you questions about:  Alcohol, tobacco, and drug  use.  Emotional well-being.  Home and relationship well-being.  Sexual activity.  Eating habits.  Work and work Statistician. What immunizations do I need?  Influenza (flu) vaccine  This is recommended every year. Tetanus, diphtheria, and pertussis (Tdap) vaccine  You may need a Td booster every 10 years. Varicella (chickenpox) vaccine  You may need this vaccine if you have not already been vaccinated. Zoster (shingles) vaccine  You may need this after age 13. Measles, mumps, and rubella (MMR) vaccine  You may need at least one dose of MMR if you were born in 1957 or later. You may also need a second dose. Pneumococcal conjugate (PCV13) vaccine  You may need this if you have certain conditions and were not previously vaccinated. Pneumococcal polysaccharide (PPSV23) vaccine  You may need one or two doses if you smoke cigarettes or if you have certain conditions. Meningococcal conjugate (MenACWY) vaccine  You may need this if you have certain conditions. Hepatitis A vaccine  You may need this if you have certain conditions or if you travel or work in places where you may be exposed to hepatitis A. Hepatitis B vaccine  You may need this if you have certain conditions or if you travel or work in places where you may be exposed to hepatitis B. Haemophilus influenzae type b (Hib) vaccine  You may need this if you have certain risk factors. Human papillomavirus (HPV)  vaccine  If recommended by your health care provider, you may need three doses over 6 months. You may receive vaccines as individual doses or as more than one vaccine together in one shot (combination vaccines). Talk with your health care provider about the risks and benefits of combination vaccines. What tests do I need? Blood tests  Lipid and cholesterol levels. These may be checked every 5 years, or more frequently if you are over 19 years old.  Hepatitis C test.  Hepatitis B test. Screening  Lung  cancer screening. You may have this screening every year starting at age 52 if you have a 30-pack-year history of smoking and currently smoke or have quit within the past 15 years.  Prostate cancer screening. Recommendations will vary depending on your family history and other risks.  Colorectal cancer screening. All adults should have this screening starting at age 33 and continuing until age 10. Your health care provider may recommend screening at age 53 if you are at increased risk. You will have tests every 1-10 years, depending on your results and the type of screening test.  Diabetes screening. This is done by checking your blood sugar (glucose) after you have not eaten for a while (fasting). You may have this done every 1-3 years.  Sexually transmitted disease (STD) testing. Follow these instructions at home: Eating and drinking  Eat a diet that includes fresh fruits and vegetables, whole grains, lean protein, and low-fat dairy products.  Take vitamin and mineral supplements as recommended by your health care provider.  Do not drink alcohol if your health care provider tells you not to drink.  If you drink alcohol: ? Limit how much you have to 0-2 drinks a day. ? Be aware of how much alcohol is in your drink. In the [place] one drink equals one 12 oz bottle of beer (355 mL), one 5 oz glass of wine (148 mL), or one 1 oz glass of hard liquor (44 mL). Lifestyle  Take daily care of your teeth and gums.  Stay active. Exercise for at least 30 minutes on 5 or more days each week.  Do not use any products that contain nicotine or tobacco, such as cigarettes, e-cigarettes, and chewing tobacco. If you need help quitting, ask your health care provider.  If you are sexually active, practice safe sex. Use a condom or other form of protection to prevent STIs (sexually transmitted infections).  Talk with your health care provider about taking a low-dose aspirin every day starting at age  36. What's next?  Go to your health care provider once a year for a well check visit.  Ask your health care provider how often you should have your eyes and teeth checked.  Stay up to date on all vaccines. This information is not intended to replace advice given to you by your health care provider. Make sure you discuss any questions you have with your health care provider. Document Revised: 10/14/2018 Document Reviewed: 10/14/2018 Elsevier Patient Education  2020 [employer].

## 2020-08-24 LAB — LIPID PANEL
Cholesterol: 155 mg/dL (ref <200)
HDL: 38 mg/dL — ABNORMAL LOW (ref >=40)
LDL Cholesterol (Calc): 96 mg/dL (calc)
Non-HDL Cholesterol (Calc): 117 mg/dL (calc) (ref <130)
Total CHOL/HDL Ratio: 4.1 (calc) (ref <5.0)
Triglycerides: 116 mg/dL (ref <150)

## 2020-08-24 LAB — CBC
HCT: 46.4 % (ref 38.5–50.0)
Hemoglobin: 15.1 g/dL (ref 13.2–17.1)
MCH: 31.7 pg (ref 27.0–33.0)
MCHC: 32.5 g/dL (ref 32.0–36.0)
MCV: 97.5 fL (ref 80.0–100.0)
MPV: 10.4 fL (ref 7.5–12.5)
Platelets: 277 10*3/uL (ref 140–400)
RBC: 4.76 10*6/uL (ref 4.20–5.80)
RDW: 13.9 % (ref 11.0–15.0)
WBC: 8.7 10*3/uL (ref 3.8–10.8)

## 2020-08-24 LAB — COMPREHENSIVE METABOLIC PANEL
AG Ratio: 1.3 (calc) (ref 1.0–2.5)
ALT: 14 U/L (ref 9–46)
AST: 11 U/L (ref 10–35)
Albumin: 3.8 g/dL (ref 3.6–5.1)
Alkaline phosphatase (APISO): 123 U/L (ref 35–144)
BUN: 11 mg/dL (ref 7–25)
CO2: 25 mmol/L (ref 20–32)
Calcium: 8.5 mg/dL — ABNORMAL LOW (ref 8.6–10.3)
Chloride: 106 mmol/L (ref 98–110)
Creat: 1.25 mg/dL (ref 0.70–1.33)
Globulin: 3 g/dL (calc) (ref 1.9–3.7)
Glucose, Bld: 143 mg/dL — ABNORMAL HIGH (ref 65–99)
Potassium: 3.9 mmol/L (ref 3.5–5.3)
Sodium: 140 mmol/L (ref 135–146)
Total Bilirubin: 0.6 mg/dL (ref 0.2–1.2)
Total Protein: 6.8 g/dL (ref 6.1–8.1)

## 2020-08-24 LAB — MICROALBUMIN / CREATININE URINE RATIO
Creatinine, Urine: 255 mg/dL (ref 20–320)
Microalb Creat Ratio: 7 mcg/mg creat (ref <30)
Microalb, Ur: 1.8 mg/dL

## 2020-08-24 LAB — TSH: TSH: 1.43 mIU/L (ref 0.40–4.50)

## 2020-08-24 NOTE — Progress Notes (Signed)
Please inform patient of the following:  Labs are all STABLE. Would like for him to keep up the good work and we can recheck in a year.  [name]. [name], MD 08/24/2020 10:26 AM

## 2020-10-05 ENCOUNTER — Ambulatory Visit: Payer: BC Managed Care – PPO | Admitting: Podiatry

## 2020-11-02 ENCOUNTER — Other Ambulatory Visit: Payer: Self-pay | Admitting: Family Medicine

## 2020-11-02 DIAGNOSIS — I509 Heart failure, unspecified: Secondary | ICD-10-CM

## 2020-11-02 DIAGNOSIS — E876 Hypokalemia: Secondary | ICD-10-CM

## 2020-11-14 ENCOUNTER — Other Ambulatory Visit: Payer: Self-pay | Admitting: *Deleted

## 2020-11-14 MED ORDER — GLUCOSE BLOOD VI STRP: ORAL_STRIP | 12 refills | Status: DC

## 2020-11-14 MED ORDER — ONETOUCH DELICA LANCETS 33G MISC: 1 refills | Status: DC

## 2020-11-16 ENCOUNTER — Other Ambulatory Visit: Payer: Self-pay

## 2020-11-16 ENCOUNTER — Encounter: Payer: Self-pay | Admitting: Podiatry

## 2020-11-16 ENCOUNTER — Ambulatory Visit (INDEPENDENT_AMBULATORY_CARE_PROVIDER_SITE_OTHER): Payer: BC Managed Care – PPO | Admitting: Podiatry

## 2020-11-16 DIAGNOSIS — E1165 Type 2 diabetes mellitus with hyperglycemia: Secondary | ICD-10-CM

## 2020-11-16 DIAGNOSIS — I872 Venous insufficiency (chronic) (peripheral): Secondary | ICD-10-CM | POA: Diagnosis not present

## 2020-11-16 DIAGNOSIS — B351 Tinea unguium: Secondary | ICD-10-CM

## 2020-11-16 DIAGNOSIS — M79675 Pain in left toe(s): Secondary | ICD-10-CM | POA: Diagnosis not present

## 2020-11-16 DIAGNOSIS — M79674 Pain in right toe(s): Secondary | ICD-10-CM

## 2020-11-16 DIAGNOSIS — Z794 Long term (current) use of insulin: Secondary | ICD-10-CM

## 2020-11-16 NOTE — Progress Notes (Signed)
This patient returns to my office for at risk foot care.  This patient requires this care by a professional since this patient will be at risk due to having diabetes and venous stasis.  This patient is unable to cut nails himself since the patient cannot reach his nails.These nails are painful walking and wearing shoes.  This patient presents for at risk foot care today.  General Appearance  Alert, conversant and in no acute stress.  Vascular  Dorsalis pedis and posterior tibial  pulses are palpable  bilaterally.  Capillary return is within normal limits  bilaterally. Temperature is within normal limits  bilaterally.  Neurologic  Senn-Weinstein monofilament wire test within normal limits  bilaterally. Muscle power within normal limits bilaterally.  Nails Thick disfigured discolored nails with subungual debris  from hallux to fifth toes bilaterally. No evidence of bacterial infection or drainage bilaterally.  Orthopedic  No limitations of motion  feet .  No crepitus or effusions noted.  No bony pathology or digital deformities noted.  Skin  normotropic skin with no porokeratosis noted bilaterally.  No signs of infections or ulcers noted.     Onychomycosis  Pain in right toes  Pain in left toes  Consent was obtained for treatment procedures.   Mechanical debridement of nails 1-5  bilaterally performed with a nail nipper.  Filed with dremel without incident.    Return office visit   3 months                   Told patient to return for periodic foot care and evaluation due to potential at risk complications.   Helane Gunther DPM

## 2020-12-05 ENCOUNTER — Telehealth: Payer: Self-pay

## 2020-12-05 NOTE — Telephone Encounter (Signed)
..   LAST APPOINTMENT DATE: 11/02/2020   NEXT APPOINTMENT DATE:@4 /21/2022  MEDICATION:OneTouch Delica Lancets 33G MISC glucose blood test strip     PHARMACY:[id number] - [place], [place] - [address]

## 2020-12-06 ENCOUNTER — Telehealth: Payer: Self-pay | Admitting: *Deleted

## 2020-12-06 DIAGNOSIS — T884XXA Failed or difficult intubation, initial encounter: Secondary | ICD-10-CM

## 2020-12-06 HISTORY — DX: Failed or difficult intubation, initial encounter: T88.4XXA

## 2020-12-06 NOTE — Telephone Encounter (Signed)
Dr Marina Goodell,  This gentleman is scheduled for a direct colon with you- he has a red Flag for a difficult airway- note states   Difficulty Due To: Difficult Airway- due to large tongue, Difficult Airway- due to reduced neck mobility, Difficult Airway-  due to neck instability and Difficult Airway- due to limited oral opening  Do you ant him to have an OV or direct at [facility] - Please advise  [facility] hx as follows  Hypertension associated with diabetes (HCC) Hyperlipidemia associated with type 2 diabetes mellitus (HCC) Diabetes (HCC) Paroxysmal atrial fibrillation (HCC) no blood thinner  Chronic diastolic CHF (congestive heart failure) (HCC), ECHO 03/2017 EF 50-55%, on Torsemide   No avs  NICM (nonischemic cardiomyopathy) (HCC), left heart cath and coronary angiography 2017 Former smoker OSA (obstructive sleep apnea), mild, not using CPAP Difficulty walking, debility, deconditioned Onychomycosis, followed by Podiatry Bilateral edema of lower extremity Renal insufficiency Compliance poor, due to mulitple factors Pain due to onychomycosis of toenails of both feet Venous stasis dermatitis of both lower extremities Adjustment reaction

## 2020-12-06 NOTE — Telephone Encounter (Signed)
Bonita Quin,  Can you schedule this pt at [facility] per Dr Marina Goodell  [facility] 12-13-2020

## 2020-12-06 NOTE — Telephone Encounter (Signed)
I reviewed his chart.  Direct at [facility] is fine.  Please note that he is diabetic.  Thank you. JP

## 2020-12-07 ENCOUNTER — Other Ambulatory Visit: Payer: Self-pay

## 2020-12-07 DIAGNOSIS — Z1211 Encounter for screening for malignant neoplasm of colon: Secondary | ICD-10-CM

## 2020-12-07 MED ORDER — ONETOUCH DELICA LANCETS 33G MISC: 1 refills | Status: DC

## 2020-12-07 MED ORDER — GLUCOSE BLOOD VI STRP: ORAL_STRIP | 12 refills | Status: DC

## 2020-12-07 NOTE — Telephone Encounter (Signed)
Pt scheduled for covid screen 12/28/20@8 :30am. Colon scheduled at [facility] 01/01/21@8 :30am.

## 2020-12-07 NOTE — Telephone Encounter (Signed)
Rx sent 

## 2020-12-10 NOTE — Telephone Encounter (Signed)
ATTEMPTED TO CALL PT AND INFORM OF CHANGE IN HIS COLON PROCEDURE TO [facility] DUE TO DIFF INTUBATION - TRIED HOME AND CELL- NO ANSWER, NO VOICE MAIL FOR EITHER - CELL NUMBER MB FULL

## 2020-12-13 ENCOUNTER — Telehealth: Payer: Self-pay

## 2020-12-13 ENCOUNTER — Ambulatory Visit (AMBULATORY_SURGERY_CENTER): Payer: Self-pay

## 2020-12-13 ENCOUNTER — Other Ambulatory Visit: Payer: Self-pay

## 2020-12-13 VITALS — Ht 75.0 in | Wt 360.0 lb

## 2020-12-13 DIAGNOSIS — Z01818 Encounter for other preprocedural examination: Secondary | ICD-10-CM

## 2020-12-13 DIAGNOSIS — Z1211 Encounter for screening for malignant neoplasm of colon: Secondary | ICD-10-CM

## 2020-12-13 MED ORDER — SUTAB 1479-225-188 MG PO TABS
24.0000 | ORAL_TABLET | ORAL | 0 refills | Status: DC
Start: 2020-12-13 — End: 2022-04-03

## 2020-12-13 NOTE — Telephone Encounter (Signed)
Pt cannot come for covid test as he is only off on Thursday and Sat. Pts EGD is moved to start at 11am so he can be covid tested that morning. He will need to arrive there at the endo unit at 7am to have the covid test.   Terri will you please call this pt and go over the previsit with him with the new dates and times? There are no previsits on a Thursday for him to come back for another appt.

## 2020-12-13 NOTE — Telephone Encounter (Signed)
Correction to my previous note pt is scheduled for colonoscopy. Pt had a previsit today over the phone.

## 2020-12-13 NOTE — Telephone Encounter (Signed)
I have a slot open in my [facility] long AM block on Thursday, 01/17/21 and would be glad to do the colonoscopy then.  - HD

## 2020-12-13 NOTE — Telephone Encounter (Signed)
Please do a virtual pre-visit for him.  - HD

## 2020-12-13 NOTE — Telephone Encounter (Signed)
Linda:  Pt came in for PV to have colon at [facility] d/t difficult intubation.  He stated that he can not do a procedure on any day except a Thursday  Please cancel his [facility] for 01/01/21 and reschedule him for a Thursday  He will also need a PV scheduled.  I was afraid that his [facility] new procedure would be too far out for it to count for today.  Thank you so much

## 2020-12-13 NOTE — Telephone Encounter (Signed)
Patient called in to notify Dr.Parker, he is stating that he went to do the Endoscopy and went to the appointment and they rescheduled it without notifying him to March on a Tuesday, but due to being short staffed at work they are unable to take personal days and only has Thursday's off, when asking to reschedule that appointment he was told he would not be able to do that at this time.

## 2020-12-13 NOTE — Telephone Encounter (Signed)
Dr. Marina Goodell please see the note below. I do not think he has been seen before. Please advise.

## 2020-12-13 NOTE — Telephone Encounter (Signed)
You are right.  I have not seen him before.  Direct referral.  I did discuss with Dr. Myrtie Neither who has kindly agreed to help.  Thanks

## 2020-12-13 NOTE — Telephone Encounter (Signed)
Linda:  Please reschedule pt to [facility] colon for a difficult intubation.  He states he can only do Thursdays--He will also need a PV scheduled.  Thanks

## 2020-12-13 NOTE — Addendum Note (Signed)
Addended by: Gillermina Hu on: 12/13/2020 02:35 PM   Modules accepted: Orders

## 2020-12-13 NOTE — Progress Notes (Signed)
Attempted to reach pt to inform him that due to a difficult intubation he needed to have procedure done at [facility].    Pt arrived today for PV and was updated that he had to have procedure at [facility] and was scheduled for 01/01/21 on a Tuesday.  He stated he was unable to have it done on a Tuesday because of work.  He needed to have it done on a Thursday.  Discussed reasons for this, pt voiced he would be unable to do this on any day except Thursday.  Nurse explained that she was unable to schedule at [facility] would have to send to the person who schedules--they would reach back to him with date and time.  Also shared with pt that scheduled depended on [facility] openings as well as Dr. Broadus John schedule.  Pt voiced understanding.

## 2020-12-13 NOTE — Progress Notes (Addendum)
No egg or soy allergy known to patient  No issues with past sedation with any surgeries or procedures No intubation problems in the past  No FH of Malignant Hyperthermia No diet pills per patient No home 02 use per patient  No blood thinners per patient  Pt denies issues with constipation  No A fib or A flutter  EMMI video to pt or via MyChart  COVID 19 guidelines implemented in PV today with Pt and RN  Pt is fully vaccinated  for Covid [facility] Covid test 3-17 at 7 am  Pt denies loose or missing teeth, denies dentures, partials, dental implants, capped or bonded teeth  S[place]  Coupon given to pt in PV today , Code to Pharmacy and  NO PA's for preps discussed with pt In PV today  Discussed with pt there will be an out-of-pocket cost for prep and that varies from $0 to 70 dollars   Due to the COVID-19 pandemic we are asking patients to follow certain guidelines.  Pt aware of COVID protocols and [facility] guidelines   Per LindaHunt, covid test 3-17 at 7 am SAME DAY as [facility] colon 3-17 at 11 am - Phone PV today mailed instruction packet with my chart instructions as well

## 2020-12-21 ENCOUNTER — Other Ambulatory Visit: Payer: Self-pay | Admitting: Family Medicine

## 2020-12-27 ENCOUNTER — Encounter: Payer: BC Managed Care – PPO | Admitting: Internal Medicine

## 2020-12-28 ENCOUNTER — Other Ambulatory Visit (HOSPITAL_COMMUNITY): Payer: BC Managed Care – PPO

## 2021-01-04 ENCOUNTER — Telehealth: Payer: Self-pay | Admitting: Gastroenterology

## 2021-01-04 NOTE — Telephone Encounter (Signed)
Inbound call from patient stating he cannot afford prep medication and is asking we have any samples in office.  Please advise.

## 2021-01-04 NOTE — Telephone Encounter (Signed)
1539 attempted pt no  answerHilda [name] PV

## 2021-01-04 NOTE — Telephone Encounter (Signed)
Attempted pt- no answerHilda [name] [facility] [id number] Attempted pt- [phone] no answer  Luis [name] PV

## 2021-01-04 NOTE — Telephone Encounter (Signed)
1623 attempted pt- no answer- no voice mail - Luis [name] PV

## 2021-01-04 NOTE — Telephone Encounter (Signed)
1655 attempted pt- no answer- no voice mail to leave message - Luis [name] pv

## 2021-01-04 NOTE — Telephone Encounter (Signed)
1609 attempted pt- no answer- no voice mail Luis [name] PV

## 2021-01-07 NOTE — Telephone Encounter (Signed)
Called Mobile number voice mail box not set up- unable to LM- [phone] Called home number [phone]- no answer-  No voice mail to Leave message - Hilda Lias PV

## 2021-01-07 NOTE — Telephone Encounter (Signed)
Spoke with pt, informed him per Hilda Lias a sample will be left on the 3rd floor for the pt to pick up, pt voiced understanding

## 2021-01-09 NOTE — Progress Notes (Addendum)
Attempted to obtain medical history via telephone, unable to reach at this time.  

## 2021-01-14 ENCOUNTER — Other Ambulatory Visit (HOSPITAL_COMMUNITY): Payer: BC Managed Care – PPO

## 2021-01-15 ENCOUNTER — Telehealth: Payer: Self-pay | Admitting: Gastroenterology

## 2021-01-15 NOTE — Telephone Encounter (Signed)
Patient called to cancel his hospital procedure.

## 2021-01-15 NOTE — Telephone Encounter (Signed)
Spoke with patient, he states that he would like to cancel upcoming colonoscopy at [facility], patient states that right now is just not a good time. He states that the prep was too expensive, offered him a free sample but he states that they offered him a free prep before but his car broke down and he was not able to get it. Patient states that he will follow up with his PCP in April. Patient states that he will call back when he is ready to reschedule his procedure.

## 2021-01-17 ENCOUNTER — Ambulatory Visit (HOSPITAL_COMMUNITY)
Admission: RE | Admit: 2021-01-17 | Payer: BC Managed Care – PPO | Source: Home / Self Care | Admitting: Gastroenterology

## 2021-01-17 ENCOUNTER — Encounter (HOSPITAL_COMMUNITY): Admission: RE | Payer: Self-pay | Source: Home / Self Care

## 2021-01-17 SURGERY — COLONOSCOPY WITH PROPOFOL
Anesthesia: Monitor Anesthesia Care

## 2021-02-21 ENCOUNTER — Ambulatory Visit (INDEPENDENT_AMBULATORY_CARE_PROVIDER_SITE_OTHER): Payer: BC Managed Care – PPO | Admitting: Family Medicine

## 2021-02-21 ENCOUNTER — Encounter: Payer: Self-pay | Admitting: Family Medicine

## 2021-02-21 ENCOUNTER — Other Ambulatory Visit: Payer: Self-pay

## 2021-02-21 ENCOUNTER — Telehealth: Payer: Self-pay

## 2021-02-21 VITALS — BP 153/95 | HR 63 | Temp 98.0°F | Ht 75.0 in | Wt 372.4 lb

## 2021-02-21 DIAGNOSIS — I152 Hypertension secondary to endocrine disorders: Secondary | ICD-10-CM | POA: Diagnosis not present

## 2021-02-21 DIAGNOSIS — E1165 Type 2 diabetes mellitus with hyperglycemia: Secondary | ICD-10-CM

## 2021-02-21 DIAGNOSIS — E1159 Type 2 diabetes mellitus with other circulatory complications: Secondary | ICD-10-CM

## 2021-02-21 DIAGNOSIS — Z794 Long term (current) use of insulin: Secondary | ICD-10-CM

## 2021-02-21 LAB — POCT GLYCOSYLATED HEMOGLOBIN (HGB A1C): Hemoglobin A1C: 6.8 % — AB (ref 4.0–5.6)

## 2021-02-21 MED ORDER — GLUCOSE BLOOD VI STRP: ORAL_STRIP | 12 refills | Status: DC

## 2021-02-21 MED ORDER — OZEMPIC (0.25 OR 0.5 MG/DOSE) 2 MG/1.5ML ~~LOC~~ SOPN: 1.0000 mg | PEN_INJECTOR | SUBCUTANEOUS | 3 refills | Status: DC

## 2021-02-21 MED ORDER — NOVOLOG MIX 70/30 FLEXPEN (70-30) 100 UNIT/ML ~~LOC~~ SUPN: 20.0000 [IU] | PEN_INJECTOR | Freq: Two times a day (BID) | SUBCUTANEOUS | 11 refills | Status: DC

## 2021-02-21 NOTE — Assessment & Plan Note (Signed)
A1c up slightly but still at goal at 6.8. He is struggling with weight loss. We will increase ozempic to 1mg  weekly. Decrease insulin 70/30 to 20 units twice daily. Recheck in 3-6 months.

## 2021-02-21 NOTE — Assessment & Plan Note (Signed)
Above goal.  Typically well controlled.  Continue metoprolol tartrate 50 mg twice daily.  Continue home monitoring.  Recheck 6 months.

## 2021-02-21 NOTE — Patient Instructions (Signed)
It was very nice to see you today!  Please keep an eye on your blood pressure.  Increase your Ozempic to 1 mg weekly.  Once you make this change on your Ozempic please decrease your insulin to 20 units twice daily.    You can buy a Sockaid on [employer].  I will see back in 6 months for your annual checkup with blood work.  Come back to see me sooner if needed.  Take care, Dr [name]  PLEASE NOTE:  If you had any lab tests please let us know if you have not heard back within a few days. You may see your results on mychart before we have a chance to review them but we will give you a call once they are reviewed by [place]. If we ordered any referrals today, please let us know if you have not heard from their office within the next week.   Please try these tips to maintain a healthy lifestyle:   Eat at least 3 REAL meals and 1-2 snacks per day.  Aim for no more than 5 hours between eating.  If you eat breakfast, please do so within one hour of getting up.    Each meal should contain half fruits/vegetables, one quarter protein, and one quarter carbs (no bigger than a computer mouse)   Cut down on sweet beverages. This includes juice, soda, and sweet tea.     Drink at least 1 glass of water with each meal and aim for at least 8 glasses per day   Exercise at least 150 minutes every week.

## 2021-02-21 NOTE — Progress Notes (Signed)
   Luis [name] is a 55 y.o. male who presents today for an office visit.  Assessment/Plan:  Chronic Problems Addressed Today: Diabetes (HCC) A1c up slightly but still at goal at 6.8. He is struggling with weight loss. We will increase ozempic to 1mg  weekly. Decrease insulin 70/30 to 20 units twice daily. Recheck in 3-6 months.   Hypertension associated with diabetes (HCC) Above goal.  Typically well controlled.  Continue metoprolol tartrate 50 mg twice daily.  Continue home monitoring.  Recheck 6 months.     Subjective:  HPI:  See A/p.         Objective:  Physical Exam: BP (!) 153/95   Pulse 63   Temp 98 F (36.7 C)   Ht 6\' 3"  (1.905 m)   Wt (!) 372 lb 6.1 oz (168.9 kg)   SpO2 98%   BMI 46.54 kg/m  Gen: No acute distress, resting comfortably Neuro: Grossly normal, moves all extremities Psych: Normal affect and thought content      Luis [name] M. 12-22-1969, MD 02/21/2021 9:47 AM

## 2021-02-21 NOTE — Telephone Encounter (Signed)
[facility] has questions about the prescription below.   Semaglutide,0.25 or 0.5MG /DOS, (OZEMPIC, 0.25 OR 0.5 MG/DOSE,) 2 MG/1.5ML SOPN  [facility]-[phone]. Ref number [id number]

## 2021-02-22 ENCOUNTER — Ambulatory Visit (INDEPENDENT_AMBULATORY_CARE_PROVIDER_SITE_OTHER): Payer: BC Managed Care – PPO | Admitting: Podiatry

## 2021-02-22 ENCOUNTER — Encounter: Payer: Self-pay | Admitting: Podiatry

## 2021-02-22 DIAGNOSIS — M79675 Pain in left toe(s): Secondary | ICD-10-CM | POA: Diagnosis not present

## 2021-02-22 DIAGNOSIS — E1165 Type 2 diabetes mellitus with hyperglycemia: Secondary | ICD-10-CM | POA: Diagnosis not present

## 2021-02-22 DIAGNOSIS — I872 Venous insufficiency (chronic) (peripheral): Secondary | ICD-10-CM | POA: Diagnosis not present

## 2021-02-22 DIAGNOSIS — M79674 Pain in right toe(s): Secondary | ICD-10-CM

## 2021-02-22 DIAGNOSIS — Z794 Long term (current) use of insulin: Secondary | ICD-10-CM

## 2021-02-22 DIAGNOSIS — B351 Tinea unguium: Secondary | ICD-10-CM

## 2021-02-22 NOTE — Progress Notes (Signed)
This patient returns to my office for at risk foot care.  This patient requires this care by a professional since this patient will be at risk due to having diabetes and venous stasis.  This patient is unable to cut nails himself since the patient cannot reach his nails.These nails are painful walking and wearing shoes.  This patient presents for at risk foot care today.  General Appearance  Alert, conversant and in no acute stress.  Vascular  Dorsalis pedis and posterior tibial  pulses are palpable  bilaterally.  Capillary return is within normal limits  bilaterally. Temperature is within normal limits  bilaterally.  Neurologic  Senn-Weinstein monofilament wire test within normal limits  bilaterally. Muscle power within normal limits bilaterally.  Nails Thick disfigured discolored nails with subungual debris  from hallux to fifth toes bilaterally. No evidence of bacterial infection or drainage bilaterally.  Orthopedic  No limitations of motion  feet .  No crepitus or effusions noted.  No bony pathology or digital deformities noted.  Skin  normotropic skin with no porokeratosis noted bilaterally.  No signs of infections or ulcers noted.     Onychomycosis  Pain in right toes  Pain in left toes  Consent was obtained for treatment procedures.   Mechanical debridement of nails 1-5  bilaterally performed with a nail nipper.  Filed with dremel without incident.    Return office visit   3 months                   Told patient to return for periodic foot care and evaluation due to potential at risk complications.   Helane Gunther DPM

## 2021-02-22 NOTE — Telephone Encounter (Signed)
Spoke with pharmacy

## 2021-03-21 ENCOUNTER — Other Ambulatory Visit: Payer: Self-pay | Admitting: Family Medicine

## 2021-03-21 DIAGNOSIS — E876 Hypokalemia: Secondary | ICD-10-CM

## 2021-04-15 ENCOUNTER — Telehealth: Payer: Self-pay

## 2021-04-15 NOTE — Telephone Encounter (Signed)
Error

## 2021-04-29 ENCOUNTER — Encounter (HOSPITAL_COMMUNITY): Payer: Self-pay

## 2021-04-29 ENCOUNTER — Emergency Department (HOSPITAL_COMMUNITY): Payer: BC Managed Care – PPO

## 2021-04-29 ENCOUNTER — Emergency Department (HOSPITAL_COMMUNITY)
Admission: EM | Admit: 2021-04-29 | Discharge: 2021-04-30 | Disposition: A | Discharge status: Home / Self Care | Payer: BC Managed Care – PPO | Attending: Emergency Medicine | Admitting: Emergency Medicine

## 2021-04-29 ENCOUNTER — Other Ambulatory Visit: Payer: Self-pay

## 2021-04-29 DIAGNOSIS — S24103A Unspecified injury at T7-T10 level of thoracic spinal cord, initial encounter: Secondary | ICD-10-CM | POA: Diagnosis not present

## 2021-04-29 DIAGNOSIS — Z794 Long term (current) use of insulin: Secondary | ICD-10-CM | POA: Insufficient documentation

## 2021-04-29 DIAGNOSIS — Z7982 Long term (current) use of aspirin: Secondary | ICD-10-CM | POA: Insufficient documentation

## 2021-04-29 DIAGNOSIS — I2510 Atherosclerotic heart disease of native coronary artery without angina pectoris: Secondary | ICD-10-CM | POA: Insufficient documentation

## 2021-04-29 DIAGNOSIS — M545 Low back pain, unspecified: Secondary | ICD-10-CM | POA: Diagnosis not present

## 2021-04-29 DIAGNOSIS — W1789XA Other fall from one level to another, initial encounter: Secondary | ICD-10-CM | POA: Insufficient documentation

## 2021-04-29 DIAGNOSIS — E785 Hyperlipidemia, unspecified: Secondary | ICD-10-CM | POA: Insufficient documentation

## 2021-04-29 DIAGNOSIS — E1169 Type 2 diabetes mellitus with other specified complication: Secondary | ICD-10-CM | POA: Diagnosis not present

## 2021-04-29 DIAGNOSIS — M47814 Spondylosis without myelopathy or radiculopathy, thoracic region: Secondary | ICD-10-CM | POA: Diagnosis not present

## 2021-04-29 DIAGNOSIS — S22079A Unspecified fracture of T9-T10 vertebra, initial encounter for closed fracture: Secondary | ICD-10-CM | POA: Diagnosis not present

## 2021-04-29 DIAGNOSIS — Z79899 Other long term (current) drug therapy: Secondary | ICD-10-CM | POA: Diagnosis not present

## 2021-04-29 DIAGNOSIS — S22078A Other fracture of T9-T10 vertebra, initial encounter for closed fracture: Secondary | ICD-10-CM | POA: Diagnosis not present

## 2021-04-29 DIAGNOSIS — M549 Dorsalgia, unspecified: Secondary | ICD-10-CM | POA: Diagnosis not present

## 2021-04-29 DIAGNOSIS — I5042 Chronic combined systolic (congestive) and diastolic (congestive) heart failure: Secondary | ICD-10-CM | POA: Diagnosis not present

## 2021-04-29 DIAGNOSIS — Z87891 Personal history of nicotine dependence: Secondary | ICD-10-CM | POA: Diagnosis not present

## 2021-04-29 DIAGNOSIS — W19XXXA Unspecified fall, initial encounter: Secondary | ICD-10-CM | POA: Diagnosis not present

## 2021-04-29 DIAGNOSIS — S2243XA Multiple fractures of ribs, bilateral, initial encounter for closed fracture: Secondary | ICD-10-CM | POA: Diagnosis not present

## 2021-04-29 DIAGNOSIS — I110 Hypertensive heart disease with heart failure: Secondary | ICD-10-CM | POA: Insufficient documentation

## 2021-04-29 MED ORDER — OXYCODONE-ACETAMINOPHEN 5-325 MG PO TABS: 1.0000 | ORAL_TABLET | Freq: Four times a day (QID) | ORAL | 0 refills | Status: DC | PRN

## 2021-04-29 MED ORDER — METHOCARBAMOL 500 MG PO TABS
1000.0000 mg | ORAL_TABLET | Freq: Once | ORAL | Status: AC
  Administered 2021-04-29: 1000 mg via ORAL
  Filled 2021-04-29: qty 2

## 2021-04-29 MED ORDER — KETOROLAC TROMETHAMINE 15 MG/ML IJ SOLN
15.0000 mg | Freq: Once | INTRAMUSCULAR | Status: AC
  Administered 2021-04-29: 15 mg via INTRAMUSCULAR
  Filled 2021-04-29: qty 1

## 2021-04-29 MED ORDER — LIDOCAINE 5 % EX PTCH
1.0000 | MEDICATED_PATCH | CUTANEOUS | Status: DC
  Administered 2021-04-29: 1 via TRANSDERMAL
  Filled 2021-04-29: qty 1

## 2021-04-29 MED ORDER — OXYCODONE-ACETAMINOPHEN 5-325 MG PO TABS
1.0000 | ORAL_TABLET | Freq: Once | ORAL | Status: AC
Start: 2021-04-29 — End: 2021-04-29
  Administered 2021-04-29: 1 via ORAL
  Filled 2021-04-29: qty 1

## 2021-04-29 NOTE — ED Triage Notes (Signed)
Per EMS pt had fall yesterday with increase back pain since the fall. No head or neck pain. Did not hit his head.   BP 140, HR 100, CBG 140, RR 22, Spo2 96

## 2021-04-29 NOTE — Progress Notes (Signed)
Orthopedic Tech Progress Note Patient Details:  Luis [name] 06/29/1966 [id number]  Patient ID: Donnelly Stager, male   DOB: [birth year], 55 y.o.   MRN: [id number] Called order into [facility] 04/29/2021, 11:28 PM

## 2021-04-29 NOTE — Discharge Instructions (Addendum)
Take ibuprofen 3 times a day with meals.  Do not take other anti-inflammatories at the same time (Advil, Motrin, naproxen, Aleve). You may supplement with Tylenol if you need further pain control. Use Percocet as needed for severe breakthrough pain.  Have caution, this make you tired or groggy.  Do not drive or operate heavy machinery while taking this medicine. Keep the TLSO brace on until you follow-up with neurosurgery. Call the office listed below tomorrow to set up a follow-up appointment. Return to the emergency room if develop severe worsening pain, numbness in your legs, loss of bowel bladder control, fever, or any new, worsening, or concerning symptoms

## 2021-04-29 NOTE — ED Provider Notes (Signed)
Luis [name]   CSN: [id number] Arrival date & time: 04/29/21  1618     History No chief complaint on file.   Luis [name] is a 55 y.o. male presenting for evaluation of back pain.  Patient states 2 days ago he fell off his counter, it is about 5 to 6 feet tall.  He landed on the floor on his back.  He did not hit his head or lose consciousness.  Since then, as he has had focal mid back pain which is severe with movement.  He has not taken anything for it including, ibuprofen.  Pain does not radiate.  No headache, neck pain, and numbness of the legs, loss of bowel bladder control.   HPI     Past Medical History:  Diagnosis Date   Chronic combined systolic and diastolic CHF (congestive heart failure) (HCC)    Difficult intubation 12/06/2020   DM (diabetes mellitus) (HCC)    Essential hypertension    History of tobacco abuse    HTN (hypertension)    Hyperlipidemia    Mild CAD    Morbid obesity (HCC)    NICM (nonischemic cardiomyopathy) (HCC)    Noncompliance    OSA (obstructive sleep apnea)    Pneumonia 2017   Premature atrial contractions    Sleep apnea     Patient Active Problem List   Diagnosis Date Noted   Difficult intubation 12/06/2020   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   Adjustment reaction 04/10/2020   Venous stasis dermatitis of both lower extremities 11/10/2019   Pain due to onychomycosis of toenails of both feet 04/29/2019   Diabetes (HCC) 02/02/2018   Compliance poor, due to mulitple factors 01/02/2018   Renal insufficiency 01/01/2018   Onychomycosis, followed by Podiatry 06/14/2017   Bilateral edema of lower extremity 06/14/2017   Hyperlipidemia associated with type 2 diabetes mellitus (HCC)    Difficulty walking, debility, deconditioned 06/11/2017   OSA (obstructive sleep apnea), mild, not using CPAP 03/31/2017   NICM (nonischemic cardiomyopathy) (HCC), left heart cath and coronary angiography 2017     Former smoker    Chronic diastolic CHF (congestive heart failure) (HCC), ECHO 03/2017 EF 50-55%, on Torsemide    Hypertension associated with diabetes (HCC) 03/19/2016   Paroxysmal atrial fibrillation (HCC) 03/19/2016    Past Surgical History:  Procedure Laterality Date   CARDIAC CATHETERIZATION N/A 03/24/2016   Procedure: Left Heart Cath and Coronary Angiography;  Surgeon: Marykay Lex, MD;  Location: [facility] INVASIVE CV LAB;  Service: Cardiovascular;  Laterality: N/A;   FEMUR IM NAIL Right 2012   FRACTURE SURGERY         Family History  Problem Relation Age of Onset   Diabetes Mellitus II Mother    Diabetes type II Other    Hypertension Maternal Grandfather    Stroke Neg Hx    Cancer Neg Hx    Colon cancer Neg Hx    Colon polyps Neg Hx    Esophageal cancer Neg Hx    Rectal cancer Neg Hx    Stomach cancer Neg Hx     Social History   Tobacco Use   Smoking status: Former    Packs/day: 0.50    Years: 20.00    Pack years: 10.00    Types: Cigarettes    Quit date: 03/02/2009    Years since quitting: 12.1   Smokeless tobacco: Never  Vaping Use   Vaping Use: Never used  Substance Use Topics   Alcohol use:  No   Drug use: No    Home Medications Prior to Admission medications   Medication Sig Start Date End Date Taking? Authorizing Provider  oxyCODONE-acetaminophen (PERCOCET/ROXICET) 5-325 MG tablet Take 1 tablet by mouth every 6 (six) hours as needed for severe pain. 04/29/21  Yes Justina Bertini, PA-C  aspirin EC 81 MG EC tablet Take 1 tablet (81 mg total) by mouth daily. 03/26/16   Dorothea Ogle, MD  citalopram (CELEXA) 20 MG tablet Take 20 mg by mouth daily. 08/04/20   [provider]  glucose blood test strip Check glucose TID/ E11.9 02/21/21   Ardith Dark, MD  insulin aspart protamine - aspart (NOVOLOG MIX 70/30 FLEXPEN) (70-30) 100 UNIT/ML FlexPen Inject 0.2 mLs (20 Units total) into the skin 2 (two) times daily. 02/21/21   Ardith Dark, MD  Insulin Pen  Needle 31G X 8 MM MISC Use to inject insulin twice daily 04/13/19   Ardith Dark, MD  Insulin Pen Needle 32G X 6 MM MISC Use to administer insulin two times daily 04/05/18   Ardith Dark, MD  metoprolol tartrate (LOPRESSOR) 50 MG tablet TAKE 1 TABLET(50 MG) BY MOUTH TWICE DAILY 05/17/20   Ardith Dark, MD  OneTouch Delica Lancets 33G MISC Use TID/ E11.9 12/07/20   Ardith Dark, MD  potassium chloride SA (KLOR-CON) 20 MEQ tablet TAKE 1 TABLET(20 MEQ) BY MOUTH DAILY 03/21/21   Ardith Dark, MD  Semaglutide,0.25 or 0.5MG /DOS, (OZEMPIC, 0.25 OR 0.5 MG/DOSE,) 2 MG/1.5ML SOPN Inject 1 mg into the skin once a week. 02/21/21   Ardith Dark, MD  simvastatin (ZOCOR) 20 MG tablet TAKE 1 TABLET(20 MG) BY MOUTH AT BEDTIME 08/06/20   Ardith Dark, MD  Sodium Sulfate-Mag Sulfate-KCl (SUTAB) [phone] MG TABS Take 24 tablets by mouth as directed. MANUFACTURER CODES!! BIN: [id number] PCN: CN GROUP: [id number] MEMBER ID: [id number];TWS AS SECONDARY INSURANCE ;NO PRIOR AUTHORIZATION 12/13/20   Danis, Andreas Blower, MD  torsemide (DEMADEX) 10 MG tablet TAKE 1 TO 2 TABLETS BY MOUTH DAILY FOR SWELLING 11/05/20   Ardith Dark, MD    Allergies    Demerol [meperidine] and Morphine and related  Review of Systems   Review of Systems  Musculoskeletal:  Positive for back pain.  All other systems reviewed and are negative.  Physical Exam Updated Vital Signs BP (!) 143/81 (BP Location: Left Arm)   Pulse 95   Temp 98.8 F (37.1 C) (Oral)   Resp 18   Ht 6\' 2"  (1.88 m)   Wt (!) 168.7 kg   SpO2 96%   BMI 47.76 kg/m   Physical Exam Vitals and nursing [name] reviewed.  Constitutional:      General: He is not in acute distress.    Appearance: Normal appearance. He is obese.     Comments: In NAD  HENT:     Head: Normocephalic and atraumatic.  Eyes:     Conjunctiva/sclera: Conjunctivae normal.     Pupils: Pupils are equal, round, and reactive to light.  Cardiovascular:     Rate and Rhythm: Normal rate and  regular rhythm.     Pulses: Normal pulses.  Pulmonary:     Effort: Pulmonary effort is normal. No respiratory distress.     Breath sounds: Normal breath sounds. No wheezing.     Comments: Speaking in full sentences.  Clear lung sounds in all fields. Abdominal:     General: There is no distension.     Palpations: Abdomen is  soft.     Tenderness: There is no abdominal tenderness. There is no guarding or rebound.  Musculoskeletal:        General: Normal range of motion.     Cervical back: Normal range of motion and neck supple.     Comments: Tenderness palpation of mid T-spine with superficial scrape.  No tenderness palpation of the paraspinal muscles.  No chest palpation of the lumbar spine.  No saddle anesthesia.  Patient able to lift legs bilaterally without radiating pain  Skin:    General: Skin is warm and dry.     Capillary Refill: Capillary refill takes less than 2 seconds.  Neurological:     Mental Status: He is alert and oriented to person, place, and time.  Psychiatric:        Mood and Affect: Mood and affect normal.        Speech: Speech normal.        Behavior: Behavior normal.    ED Results / Procedures / Treatments   Labs (all labs ordered are listed, but only abnormal results are displayed) Labs Reviewed - No data to display  EKG None  Radiology DG Thoracic Spine W/Swimmers  Result Date: 04/29/2021 CLINICAL DATA:  Fall, back pain EXAM: THORACIC SPINE - 3 VIEWS COMPARISON:  None. FINDINGS: Flowing anterior and lateral osteophytes. No fracture or malalignment. No focal bone lesion. IMPRESSION: Degenerative changes.  No acute bony abnormality. Electronically Signed   By: Charlett Nose M.D.   On: 04/29/2021 19:36   CT Thoracic Spine Wo Contrast  Result Date: 04/29/2021 CLINICAL DATA:  Fall, increasing back pain EXAM: CT THORACIC AND LUMBAR SPINE WITHOUT CONTRAST TECHNIQUE: Multidetector CT imaging of the thoracic and lumbar spine was performed without contrast.  Multiplanar CT image reconstructions were also generated. COMPARISON:  CT abdomen pelvis 01/01/2018, CTA chest 03/20/2016 FINDINGS: CT THORACIC SPINE FINDINGS Alignment: Preservation of the normal thoracic kyphosis. Gradual dextrocurvature with an apex at the T7 level. Asymmetric anterior widening across the T9-T10 level, further detailed below. No other acute or traumatic malalignment. Vertebrae: Multilevel flowing anterior osteophytosis, compatible with features of diffuse idiopathic skeletal hyperostosis (DISH). There is fracture through the bridging anterior osteophyte at the T9-10 level with a transversely oriented fracture plane extending into the left inferolateral endplate, traversing posteriorly through the bilateral lamina and into the spinous process. Widening of the disc space is seen anteriorly but without conspicuous displacement across the spinous process fracture. Regardless this is an unstable fracture. Additional fracture line propagation is seen extending into partially fused posterior left and right tenth ribs. No other visible thoracic spine fracture or traumatic osseous injury is identified. Paraspinal and other soft tissues: Mild paraspinal full thickening and swelling adjacent the fracture site. No visible canal hematoma. No other significant paravertebral fluid, swelling, gas or hemorrhage. No acute traumatic abnormality in the posterior chest or upper abdomen. Disc levels: Multilevel bony fusion of the spine albeit with disc spaces remaining largely preserved and without significant impingement upon the spinal canal. There is some multilevel facet arthropathy and hypertrophic changes most pronounced at the T4-T7 levels resulting in moderate right neural foraminal narrowing and at the T10-11 level resulting in some mild bilateral foraminal narrowing. CT LUMBAR SPINE FINDINGS Segmentation: 5 normally formed lumbar vertebrae. Alignment: Preservation of normal lumbar lordosis. No abnormally  widened, jumped or perched facets. Vertebrae: No acute vertebral fracture or vertebral body height loss. Exuberant anterior osteophytosis as well as degenerative changes further detailed below. No suspicious lytic or blastic lesions.  Included bones of pelvis are intact and congruent with bilateral SI joint arthrosis and partial fusion across the right SI joint. Paraspinal and other soft tissues: No paraspinal fluid, swelling, gas or hemorrhage. No visible canal hematoma. Included portions of posterior abdomen and pelvis are free of acute abnormality. Mild posterior body wall edema. Minimal atherosclerotic plaque in the aorta. Disc levels: Level by level evaluation of the lumbar spine below: T12-L1: Bridging anterior osteophytes. Bilateral facet arthropathy. No significant spinal canal or foraminal stenosis. L1-L2: Bridging anterior osteophytes. Bilateral facet arthropathy. No canal stenosis. Moderate bilateral foraminal narrowing. L2-L3: Non bridging anterior osteophytes. Bilateral facet arthropathy. No canal stenosis. Severe right and moderate left foraminal narrowing. L3-L4: Non bridging anterior osteophytes. Bilateral facet arthropathy. Moderate bilateral foraminal narrowing. L4-L5: Non bridging anterior osteophytes. Shallow global disc bulge. Bilateral facet arthropathy. Ligamentum flavum infolding. Mild canal stenosis. Severe bilateral foraminal narrowing and likely contact of the exiting nerve roots. L5-S1: Minimal anterior spurring. Central disc protrusion. Bilateral facet arthropathy. No significant canal stenosis. Severe bilateral foraminal narrowing. IMPRESSION: CT THORACIC SPINE IMPRESSION Chalk stick fracture extending through the T9-T10 disc space into the inferior endplate T9 as well as through the posterior lamina and spinous process. Anterior widening compatible with a hyperextension type injury (AO spine B3). Multilevel bridging anterior osteophytosis compatible with features of diffuse idiopathic  skeletal hyperostosis. Multilevel facet arthropathy resulting and moderate right neural foraminal narrowing T4-T7 and mild bilateral narrowing T10-T11. CT LUMBAR SPINE IMPRESSION No acute fracture or traumatic malalignment. 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 Additional degenerative changes as detailed level by level above with maximal features at the L4-S1 levels including mild L4-5 canal stenosis and severe L4-5, L5-S1 foraminal narrowing. These results were called by telephone at the time of interpretation on 04/29/2021 at 9:42 pm to provider [facility] , who verbally acknowledged these results. Electronically Signed   By: Kreg Shropshire M.D.   On: 04/29/2021 21:42   CT Lumbar Spine Wo Contrast  Result Date: 04/29/2021 CLINICAL DATA:  Fall, increasing back pain EXAM: CT THORACIC AND LUMBAR SPINE WITHOUT CONTRAST TECHNIQUE: Multidetector CT imaging of the thoracic and lumbar spine was performed without contrast. Multiplanar CT image reconstructions were also generated. COMPARISON:  CT abdomen pelvis 01/01/2018, CTA chest 03/20/2016 FINDINGS: CT THORACIC SPINE FINDINGS Alignment: Preservation of the normal thoracic kyphosis. Gradual dextrocurvature with an apex at the T7 level. Asymmetric anterior widening across the T9-T10 level, further detailed below. No other acute or traumatic malalignment. Vertebrae: Multilevel flowing anterior osteophytosis, compatible with features of diffuse idiopathic skeletal hyperostosis (DISH). There is fracture through the bridging anterior osteophyte at the T9-10 level with a transversely oriented fracture plane extending into the left inferolateral endplate, traversing posteriorly through the bilateral lamina and into the spinous process. Widening of the disc space is seen anteriorly but without conspicuous displacement across the spinous process fracture. Regardless this is an unstable fracture. Additional fracture line propagation is seen extending into  partially fused posterior left and right tenth ribs. No other visible thoracic spine fracture or traumatic osseous injury is identified. Paraspinal and other soft tissues: Mild paraspinal full thickening and swelling adjacent the fracture site. No visible canal hematoma. No other significant paravertebral fluid, swelling, gas or hemorrhage. No acute traumatic abnormality in the posterior chest or upper abdomen. Disc levels: Multilevel bony fusion of the spine albeit with disc spaces remaining largely preserved and without significant impingement upon the spinal canal. There is some multilevel facet arthropathy and hypertrophic changes most pronounced at  the T4-T7 levels resulting in moderate right neural foraminal narrowing and at the T10-11 level resulting in some mild bilateral foraminal narrowing. CT LUMBAR SPINE FINDINGS Segmentation: 5 normally formed lumbar vertebrae. Alignment: Preservation of normal lumbar lordosis. No abnormally widened, jumped or perched facets. Vertebrae: No acute vertebral fracture or vertebral body height loss. Exuberant anterior osteophytosis as well as degenerative changes further detailed below. No suspicious lytic or blastic lesions. Included bones of pelvis are intact and congruent with bilateral SI joint arthrosis and partial fusion across the right SI joint. Paraspinal and other soft tissues: No paraspinal fluid, swelling, gas or hemorrhage. No visible canal hematoma. Included portions of posterior abdomen and pelvis are free of acute abnormality. Mild posterior body wall edema. Minimal atherosclerotic plaque in the aorta. Disc levels: Level by level evaluation of the lumbar spine below: T12-L1: Bridging anterior osteophytes. Bilateral facet arthropathy. No significant spinal canal or foraminal stenosis. L1-L2: Bridging anterior osteophytes. Bilateral facet arthropathy. No canal stenosis. Moderate bilateral foraminal narrowing. L2-L3: Non bridging anterior osteophytes. Bilateral  facet arthropathy. No canal stenosis. Severe right and moderate left foraminal narrowing. L3-L4: Non bridging anterior osteophytes. Bilateral facet arthropathy. Moderate bilateral foraminal narrowing. L4-L5: Non bridging anterior osteophytes. Shallow global disc bulge. Bilateral facet arthropathy. Ligamentum flavum infolding. Mild canal stenosis. Severe bilateral foraminal narrowing and likely contact of the exiting nerve roots. L5-S1: Minimal anterior spurring. Central disc protrusion. Bilateral facet arthropathy. No significant canal stenosis. Severe bilateral foraminal narrowing. IMPRESSION: CT THORACIC SPINE IMPRESSION Chalk stick fracture extending through the T9-T10 disc space into the inferior endplate T9 as well as through the posterior lamina and spinous process. Anterior widening compatible with a hyperextension type injury (AO spine B3). Multilevel bridging anterior osteophytosis compatible with features of diffuse idiopathic skeletal hyperostosis. Multilevel facet arthropathy resulting and moderate right neural foraminal narrowing T4-T7 and mild bilateral narrowing T10-T11. CT LUMBAR SPINE IMPRESSION No acute fracture or traumatic malalignment. 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 Additional degenerative changes as detailed level by level above with maximal features at the L4-S1 levels including mild L4-5 canal stenosis and severe L4-5, L5-S1 foraminal narrowing. These results were called by telephone at the time of interpretation on 04/29/2021 at 9:42 pm to provider [facility] , who verbally acknowledged these results. Electronically Signed   By: Kreg ShropshirePrice  DeHay M.D.   On: 04/29/2021 21:42    Procedures Procedures   Medications Ordered in ED Medications  lidocaine (LIDODERM) 5 % 1 patch (1 patch Transdermal Patch Applied 04/29/21 2011)  ketorolac (TORADOL) 15 MG/ML injection 15 mg (15 mg Intramuscular Given 04/29/21 1852)  oxyCODONE-acetaminophen (PERCOCET/ROXICET) 5-325 MG  per tablet 1 tablet (1 tablet Oral Given 04/29/21 1852)  methocarbamol (ROBAXIN) tablet 1,000 mg (1,000 mg Oral Given 04/29/21 2011)    ED Course  I have reviewed the triage vital signs and the nursing notes.  Pertinent labs & imaging results that were available during my care of the patient were reviewed by me and considered in my medical decision making (see chart for details).    MDM Rules/Calculators/A&P                          Patient presenting for evaluation of mid T-spine pain after a fall a few days ago.  On exam, patient is neurovascular intact.  He does have signs of injury in this area.  And pain only over the midline spine, no paraspinal tenderness.  Will start with x-rays.  X-rays viewed  and independently interpreted by me, no fracture or dislocation.  However as patient has focal midline T-spine pain, CTs ordered.  CT shows a T9-T10 fracture with hyperextension.  Widened anteriorly.  Will discuss with neurosurgery.  Discussed with Audree Bane, NP from neurosurgery who reviewed the case with Dr. Lovell Sheehan.  They recommend TLSO and follow-up in the office.  Discussed findings and plan with patient, who is agreeable. At this time, pt appears safe for d/c. Return precautions given. Pt states he understands and agrees to plan.   Final Clinical Impression(s) / ED Diagnoses Final diagnoses:  Fall  Other closed fracture of ninth thoracic vertebra, initial encounter [facility])  Other closed fracture of tenth thoracic vertebra, initial encounter [facility])    Rx / DC Orders ED Discharge Orders          Ordered    oxyCODONE-acetaminophen (PERCOCET/ROXICET) 5-325 MG tablet  Every 6 hours PRN        04/29/21 2321             Alveria Apley, PA-C 04/29/21 2323    Tilden Fossa, MD 04/30/21 1515

## 2021-04-30 ENCOUNTER — Other Ambulatory Visit: Payer: Self-pay | Admitting: Family Medicine

## 2021-04-30 DIAGNOSIS — E782 Mixed hyperlipidemia: Secondary | ICD-10-CM

## 2021-05-01 ENCOUNTER — Telehealth: Payer: Self-pay

## 2021-05-01 NOTE — Telephone Encounter (Signed)
Pt called asking about mixing some medications. He stated that they are over the counter and had some questions about them. Can someone call him back?

## 2021-05-01 NOTE — Telephone Encounter (Signed)
Called and spoke with pt and questions answered. 

## 2021-05-01 NOTE — Telephone Encounter (Signed)
Lmtcb.

## 2021-05-02 ENCOUNTER — Other Ambulatory Visit: Payer: Self-pay

## 2021-05-02 ENCOUNTER — Encounter: Payer: Self-pay | Admitting: Family

## 2021-05-02 ENCOUNTER — Telehealth (INDEPENDENT_AMBULATORY_CARE_PROVIDER_SITE_OTHER): Payer: BC Managed Care – PPO | Admitting: Family

## 2021-05-02 VITALS — Ht 74.0 in | Wt 371.9 lb

## 2021-05-02 DIAGNOSIS — Z09 Encounter for follow-up examination after completed treatment for conditions other than malignant neoplasm: Secondary | ICD-10-CM | POA: Diagnosis not present

## 2021-05-02 DIAGNOSIS — S22009D Unspecified fracture of unspecified thoracic vertebra, subsequent encounter for fracture with routine healing: Secondary | ICD-10-CM | POA: Diagnosis not present

## 2021-05-02 MED ORDER — IBUPROFEN 800 MG PO TABS: 800.0000 mg | ORAL_TABLET | Freq: Three times a day (TID) | ORAL | 0 refills | Status: DC | PRN

## 2021-05-02 MED ORDER — OXYCODONE-ACETAMINOPHEN 10-325 MG PO TABS: 1.0000 | ORAL_TABLET | Freq: Three times a day (TID) | ORAL | 0 refills | Status: DC | PRN

## 2021-05-02 NOTE — Progress Notes (Signed)
Virtual Visit via Video   I connected with patient on 05/02/21 at  2:45 PM EDT by a video enabled telemedicine application and verified that I am speaking with the correct person using two identifiers.  Location patient: Home Location provider: [facility] [employer] [facility] Persons participating in the virtual visit: Patient, Provider, Janeece Riggers  I discussed the limitations of evaluation and management by telemedicine and the availability of in person appointments. The patient expressed understanding and agreed to proceed.  Subjective:   HPI:   55 year old male presents today as a hospital follow-up from 04/29/2020 where he presented with extreme back pain after sustaining a fall off of a counter.  He was found to have a fracture in the T-spine at level 9 and 10, arthritis, and central disc protrusion.  He is scheduled to see the neurosurgeon on May 07, 2021.  However, he is out of pain medication and Tylenol is not helping.  He is wearing a back brace during the day.  Having a lot of difficulty sleeping at night due to increased pain.  Pain 10 out of 10.  Worse with movement.  Reports not getting great pain relief with the medication prescribed through the emergency department (Percocet 5/325).  ROS:   See pertinent positives and negatives per HPI.  Patient Active Problem List   Diagnosis Date Noted   Difficult intubation 12/06/2020   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   Adjustment reaction 04/10/2020   Venous stasis dermatitis of both lower extremities 11/10/2019   Pain due to onychomycosis of toenails of both feet 04/29/2019   Diabetes (HCC) 02/02/2018   Compliance poor, due to mulitple factors 01/02/2018   Renal insufficiency 01/01/2018   Onychomycosis, followed by Podiatry 06/14/2017   Bilateral edema of lower extremity 06/14/2017   Hyperlipidemia associated with type 2 diabetes mellitus (HCC)    Difficulty walking, debility, deconditioned 06/11/2017   OSA (obstructive sleep  apnea), mild, not using CPAP 03/31/2017   NICM (nonischemic cardiomyopathy) (HCC), left heart cath and coronary angiography 2017    Former smoker    Chronic diastolic CHF (congestive heart failure) (HCC), ECHO 03/2017 EF 50-55%, on Torsemide    Hypertension associated with diabetes (HCC) 03/19/2016   Paroxysmal atrial fibrillation (HCC) 03/19/2016    Social History   Tobacco Use   Smoking status: Former    Packs/day: 0.50    Years: 20.00    Pack years: 10.00    Types: Cigarettes    Quit date: 03/02/2009    Years since quitting: 12.1   Smokeless tobacco: Never  Substance Use Topics   Alcohol use: No    Current Outpatient Medications:    aspirin EC 81 MG EC tablet, Take 1 tablet (81 mg total) by mouth daily., Disp: 30 tablet, Rfl: 0   citalopram (CELEXA) 20 MG tablet, TAKE 1 TABLET(20 MG) BY MOUTH DAILY, Disp: 30 tablet, Rfl: 3   glucose blood test strip, Check glucose TID/ E11.9, Disp: 100 each, Rfl: 12   ibuprofen (ADVIL) 800 MG tablet, Take 1 tablet (800 mg total) by mouth every 8 (eight) hours as needed., Disp: 60 tablet, Rfl: 0   insulin aspart protamine - aspart (NOVOLOG MIX 70/30 FLEXPEN) (70-30) 100 UNIT/ML FlexPen, Inject 0.2 mLs (20 Units total) into the skin 2 (two) times daily., Disp: 15 mL, Rfl: 11   Insulin Pen Needle 31G X 8 MM MISC, Use to inject insulin twice daily, Disp: 100 each, Rfl: 11   Insulin Pen Needle 32G X 6 MM MISC, Use to  administer insulin two times daily, Disp: 90 each, Rfl: 11   metoprolol tartrate (LOPRESSOR) 50 MG tablet, TAKE 1 TABLET(50 MG) BY MOUTH TWICE DAILY, Disp: 180 tablet, Rfl: 3   OneTouch Delica Lancets 33G MISC, Use TID/ E11.9, Disp: 100 each, Rfl: 1   oxyCODONE-acetaminophen (PERCOCET) 10-325 MG tablet, Take 1 tablet by mouth every 8 (eight) hours as needed for pain., Disp: 30 tablet, Rfl: 0   potassium chloride SA (KLOR-CON) 20 MEQ tablet, TAKE 1 TABLET(20 MEQ) BY MOUTH DAILY, Disp: 30 tablet, Rfl: 2   Semaglutide,0.25 or 0.5MG /DOS,  (OZEMPIC, 0.25 OR 0.5 MG/DOSE,) 2 MG/1.5ML SOPN, Inject 1 mg into the skin once a week., Disp: 1.5 mL, Rfl: 3   simvastatin (ZOCOR) 20 MG tablet, TAKE 1 TABLET(20 MG) BY MOUTH AT BEDTIME, Disp: 90 tablet, Rfl: 1   Sodium Sulfate-Mag Sulfate-KCl (SUTAB) 7255431440 MG TABS, Take 24 tablets by mouth as directed. MANUFACTURER CODES!! BIN: [id number] PCN: CN GROUP: [id number] MEMBER ID: [id number];OJJ AS SECONDARY INSURANCE ;NO PRIOR AUTHORIZATION, Disp: 24 tablet, Rfl: 0   torsemide (DEMADEX) 10 MG tablet, TAKE 1 TO 2 TABLETS BY MOUTH DAILY FOR SWELLING, Disp: 60 tablet, Rfl: 2  Allergies  Allergen Reactions   Demerol [Meperidine] Itching   Morphine And Related Itching    Objective:   Ht 6\' 2"  (1.88 m)   Wt (!) 371 lb 14.7 oz (168.7 kg)   BMI 47.75 kg/m   Patient is well-developed, well-nourished in no acute distress.  Resting as comfortable as possible Head is normocephalic, atraumatic.  No labored breathing.  Speech is clear and coherent with logical content.  Patient is alert and oriented at baseline.    Assessment and Plan:  Luis [name] was seen today for back pain.  Diagnoses and all orders for this visit:  Closed fracture dislocation of thoracic spine with routine healing, subsequent encounter  Hospital discharge follow-up  Other orders -     oxyCODONE-acetaminophen (PERCOCET) 10-325 MG tablet; Take 1 tablet by mouth every 8 (eight) hours as needed for pain. -     ibuprofen (ADVIL) 800 MG tablet; Take 1 tablet (800 mg total) by mouth every 8 (eight) hours as needed. Follow-up with neurosurgery as scheduled on July 5.  FMLA paperwork will be submitted for completion.  Ibuprofen 800 mg as needed.  Call the office if symptoms worsen or persist.  Recheck as scheduled and sooner as needed.  11-23-1979, FNP 05/02/2021

## 2021-05-07 ENCOUNTER — Telehealth: Payer: Self-pay

## 2021-05-07 ENCOUNTER — Telehealth: Payer: Self-pay | Admitting: Family Medicine

## 2021-05-07 DIAGNOSIS — M546 Pain in thoracic spine: Secondary | ICD-10-CM | POA: Diagnosis not present

## 2021-05-07 DIAGNOSIS — S22078A Other fracture of T9-T10 vertebra, initial encounter for closed fracture: Secondary | ICD-10-CM | POA: Diagnosis not present

## 2021-05-07 DIAGNOSIS — Z6841 Body Mass Index (BMI) 40.0 and over, adult: Secondary | ICD-10-CM | POA: Diagnosis not present

## 2021-05-07 DIAGNOSIS — I10 Essential (primary) hypertension: Secondary | ICD-10-CM | POA: Diagnosis not present

## 2021-05-07 NOTE — Telephone Encounter (Signed)
Pt called regarding FMLA forms that needs to be filled out by Dr [name] and they need to cover 6/27-7/19. Please give him a call back with any questions

## 2021-05-07 NOTE — Telephone Encounter (Signed)
Pt called in asking on the fmla forms from [employer]. I told pt I didn't see where we have received them and asked him to call [employer] to fax over the forms again.

## 2021-05-08 NOTE — Telephone Encounter (Signed)
Have forms been received and filled out?

## 2021-05-08 NOTE — Telephone Encounter (Signed)
I have not seen any forms.  [name]. [name], MD 05/08/2021 9:28 AM

## 2021-05-08 NOTE — Telephone Encounter (Signed)
Can you check into this and follow up with pt please.

## 2021-05-09 NOTE — Telephone Encounter (Signed)
Pt called again about FMLA forms. I advised him to ask [employer] to resend the paper work

## 2021-05-09 NOTE — Telephone Encounter (Signed)
Noted  

## 2021-05-09 NOTE — Telephone Encounter (Signed)
Talked to [facility] and they are requesting medical records from [place] for [facility]. Faxed over request from [employer] to medical records

## 2021-05-21 ENCOUNTER — Other Ambulatory Visit: Payer: Self-pay

## 2021-05-21 MED ORDER — OXYCODONE-ACETAMINOPHEN 10-325 MG PO TABS: 1.0000 | ORAL_TABLET | Freq: Three times a day (TID) | ORAL | 0 refills | Status: DC | PRN

## 2021-05-21 NOTE — Telephone Encounter (Signed)
I will give one time refill. I have not seen him for this recommend he follow up with me soon though he should be following up the surgeon as well who can prescribe his pain medications if needed.  [name]. [name], MD 05/21/2021 1:02 PM

## 2021-05-21 NOTE — Telephone Encounter (Signed)
Patient requesting a refill. States that his pain level is still very high, unable to sleep.

## 2021-05-23 ENCOUNTER — Telehealth: Payer: Self-pay

## 2021-05-23 NOTE — Telephone Encounter (Signed)
Patient called in stating that he is going to need a PA for oxycodone-acetaminophen 10-325

## 2021-05-24 NOTE — Telephone Encounter (Signed)
[facility] is calling and needs a PA for the patients medication can call [phone] it will be the fastest way to get the PA done.

## 2021-05-24 NOTE — Telephone Encounter (Signed)
Cover My Meds:  Prior Auth for Oxycodone-Acetaminophen 10-325 mg tab has been approved.  Additional information will be provided in approval letter sent to patient. [employer] PA  I was not able to lvm.

## 2021-05-27 ENCOUNTER — Encounter: Payer: Self-pay | Admitting: Family Medicine

## 2021-05-27 ENCOUNTER — Telehealth (INDEPENDENT_AMBULATORY_CARE_PROVIDER_SITE_OTHER): Payer: BC Managed Care – PPO | Admitting: Family Medicine

## 2021-05-27 ENCOUNTER — Encounter: Payer: Self-pay | Admitting: Podiatry

## 2021-05-27 ENCOUNTER — Ambulatory Visit (INDEPENDENT_AMBULATORY_CARE_PROVIDER_SITE_OTHER): Payer: BC Managed Care – PPO | Admitting: Podiatry

## 2021-05-27 ENCOUNTER — Other Ambulatory Visit: Payer: Self-pay

## 2021-05-27 VITALS — Ht 75.0 in | Wt 374.0 lb

## 2021-05-27 DIAGNOSIS — M549 Dorsalgia, unspecified: Secondary | ICD-10-CM

## 2021-05-27 DIAGNOSIS — E1165 Type 2 diabetes mellitus with hyperglycemia: Secondary | ICD-10-CM | POA: Diagnosis not present

## 2021-05-27 DIAGNOSIS — Z794 Long term (current) use of insulin: Secondary | ICD-10-CM

## 2021-05-27 DIAGNOSIS — B351 Tinea unguium: Secondary | ICD-10-CM

## 2021-05-27 DIAGNOSIS — I872 Venous insufficiency (chronic) (peripheral): Secondary | ICD-10-CM | POA: Diagnosis not present

## 2021-05-27 DIAGNOSIS — S22079D Unspecified fracture of T9-T10 vertebra, subsequent encounter for fracture with routine healing: Secondary | ICD-10-CM

## 2021-05-27 DIAGNOSIS — M79675 Pain in left toe(s): Secondary | ICD-10-CM

## 2021-05-27 DIAGNOSIS — M79674 Pain in right toe(s): Secondary | ICD-10-CM

## 2021-05-27 NOTE — Assessment & Plan Note (Signed)
He is on Ozempic.  Currently not able to be mobile due to severe back pain which is contributing to his obesity.

## 2021-05-27 NOTE — Progress Notes (Signed)
This patient returns to my office for at risk foot care.  This patient requires this care by a professional since this patient will be at risk due to having diabetes and venous stasis.  This patient is unable to cut nails himself since the patient cannot reach his nails.These nails are painful walking and wearing shoes.  This patient presents for at risk foot care today.  General Appearance  Alert, conversant and in no acute stress.  Vascular  Dorsalis pedis and posterior tibial  pulses are palpable  bilaterally.  Capillary return is within normal limits  bilaterally. Temperature is within normal limits  bilaterally.  Neurologic  Senn-Weinstein monofilament wire test within normal limits  bilaterally. Muscle power within normal limits bilaterally.  Nails Thick disfigured discolored nails with subungual debris  from hallux to fifth toes bilaterally. No evidence of bacterial infection or drainage bilaterally.  Orthopedic  No limitations of motion  feet .  No crepitus or effusions noted.  No bony pathology or digital deformities noted.  Skin  normotropic skin with no porokeratosis noted bilaterally.  No signs of infections or ulcers noted.     Onychomycosis  Pain in right toes  Pain in left toes  Consent was obtained for treatment procedures.   Mechanical debridement of nails 1-5  bilaterally performed with a nail nipper.  Filed with dremel without incident.    Return office visit   3 months                   Told patient to return for periodic foot care and evaluation due to potential at risk complications.   Helane Gunther DPM

## 2021-05-27 NOTE — Assessment & Plan Note (Signed)
Last A1c at goal at 6.8.  We will continue Ozempic 1 mg weekly and insulin 70/30 20 units twice daily.  We will need to recheck A1c when he comes back in for next office visit.

## 2021-05-27 NOTE — Progress Notes (Signed)
   Luis [name] is a 55 y.o. male who presents today for a virtual office visit.  Assessment/Plan:  New/Acute Problems: Back pain/vertebral body fractures No red flags however still has persistent severe pain.  We will update his return to work date to 06/24/2021.  Hopefully this will allow him more time for his fractures to heal and the pain to improve.  He has Percocet use as needed.  He will also follow-up with neurosurgery as previously planned.  Discussed reasons to return to care earlier.  Chronic Problems Addressed Today: Diabetes (HCC) Last A1c at goal at 6.8.  We will continue Ozempic 1 mg weekly and insulin 70/30 20 units twice daily.  We will need to recheck A1c when he comes back in for next office visit.  Morbid obesity (HCC) He is on Ozempic.  Currently not able to be mobile due to severe back pain which is contributing to his obesity.       Subjective:  HPI:  Patient here for follow up. Unfortunately fractured his T9 and T10 vertebra after falling about a month ago.  He has follow-up with neurosurgery.  No surgical intervention needed was told that his fractures will heal with time.  His pain is still severe.  He attempted to go back to work about a week ago however due to pain he does not like this is currently feasible.  He is currently taking Percocet which works well to alleviate his symptoms.  He will be following up with surgery again in about 6 weeks.  Symptoms seem to be improving slowly.  No worsening symptoms.  No reported bowel or bladder incontinence.       Objective/Observations  Physical Exam: Gen: NAD, resting comfortably Pulm: Normal work of breathing Neuro: Grossly normal, moves all extremities Psych: Normal affect and thought content  Virtual Visit via Video   I connected with [name] on 05/27/21 at  3:40 PM EDT by a video enabled telemedicine application and verified that I am speaking with the correct person using two identifiers. The  limitations of evaluation and management by telemedicine and the availability of in person appointments were discussed. The patient expressed understanding and agreed to proceed.   Patient location: Home Provider location: [facility] [employer] [facility] Persons participating in the virtual visit: Myself and Patient     [name]. [name], MD 05/27/2021 1:28 PM

## 2021-06-06 ENCOUNTER — Telehealth: Payer: Self-pay

## 2021-06-06 ENCOUNTER — Other Ambulatory Visit: Payer: Self-pay

## 2021-06-06 MED ORDER — ONETOUCH DELICA LANCETS 33G MISC: 5 refills | Status: AC

## 2021-06-06 MED ORDER — ONETOUCH DELICA LANCETS 33G MISC: 5 refills | Status: DC

## 2021-06-06 NOTE — Telephone Encounter (Addendum)
  Encourage patient to contact the pharmacy for refills or they can request refills through [facility]  LAST APPOINTMENT DATE:  05/27/2021  NEXT APPOINTMENT DATE:   MEDICATION: OneTouch Delica Lancets 33G MISC  Is the patient out of medication? Yes   PHARMACY: [facility] Address: [address], [place], [place] [address]

## 2021-06-06 NOTE — Telephone Encounter (Signed)
Rx sent in

## 2021-06-07 ENCOUNTER — Telehealth: Payer: Self-pay

## 2021-06-07 NOTE — Telephone Encounter (Signed)
LAST APPOINTMENT DATE:  05/27/21  NEXT APPOINTMENT DATE: 08/29/21  MEDICATION:glucose blood test strip  PHARMACY: [facility]  [address] [place], [place], [place] [address]

## 2021-06-10 ENCOUNTER — Other Ambulatory Visit: Payer: Self-pay | Admitting: *Deleted

## 2021-06-10 ENCOUNTER — Telehealth: Payer: Self-pay

## 2021-06-10 MED ORDER — GLUCOSE BLOOD VI STRP: ORAL_STRIP | 12 refills | Status: DC

## 2021-06-10 NOTE — Telephone Encounter (Signed)
Rx send to [facility]

## 2021-06-10 NOTE — Telephone Encounter (Signed)
Can we resend the  glucose blood test strip to  [id number] - [place], [place] - [address] AT [facility] [address]    PHARMACY STATES THEY NEVER RECEIVED IT

## 2021-06-10 NOTE — Telephone Encounter (Addendum)
Patient is calling in stating he moved and would like all prescriptions to go to [facility] - Address: [address].. Would like for his chart to have only this [facility].

## 2021-06-11 ENCOUNTER — Other Ambulatory Visit: Payer: Self-pay

## 2021-06-11 MED ORDER — GLUCOSE BLOOD VI STRP
ORAL_STRIP | 12 refills | Status: DC
Start: 2021-06-11 — End: 2021-06-14

## 2021-06-11 NOTE — Telephone Encounter (Signed)
Refill resent to [facility].

## 2021-06-13 ENCOUNTER — Telehealth: Payer: Self-pay | Admitting: *Deleted

## 2021-06-13 ENCOUNTER — Telehealth: Payer: Self-pay

## 2021-06-13 ENCOUNTER — Other Ambulatory Visit: Payer: Self-pay | Admitting: *Deleted

## 2021-06-13 NOTE — Telephone Encounter (Signed)
Error

## 2021-06-13 NOTE — Telephone Encounter (Signed)
Called patient X2 unable to lvm, voice mail is full Need to verified glucometer name to order strips   Spoke with pharmacy, stated patient call complaining got wrong strips Leando phone before giving glucometer information

## 2021-06-13 NOTE — Telephone Encounter (Signed)
Pt called and he said that he needs 1 touch verio reflect strips

## 2021-06-14 ENCOUNTER — Other Ambulatory Visit: Payer: Self-pay | Admitting: *Deleted

## 2021-06-14 MED ORDER — GLUCOSE BLOOD VI STRP: ORAL_STRIP | 12 refills | Status: DC

## 2021-06-14 NOTE — Telephone Encounter (Signed)
Rx send to pharmacy  

## 2021-06-28 ENCOUNTER — Other Ambulatory Visit: Payer: Self-pay

## 2021-06-28 DIAGNOSIS — M546 Pain in thoracic spine: Secondary | ICD-10-CM | POA: Diagnosis not present

## 2021-06-28 DIAGNOSIS — I10 Essential (primary) hypertension: Secondary | ICD-10-CM | POA: Diagnosis not present

## 2021-06-28 DIAGNOSIS — S22078A Other fracture of T9-T10 vertebra, initial encounter for closed fracture: Secondary | ICD-10-CM | POA: Diagnosis not present

## 2021-06-28 DIAGNOSIS — S22078D Other fracture of T9-T10 vertebra, subsequent encounter for fracture with routine healing: Secondary | ICD-10-CM | POA: Diagnosis not present

## 2021-06-28 NOTE — Telephone Encounter (Signed)
Patient states he is out of current pain medication and it is not currently working and would like to have something else sent in for his pain or another suggestion on what he can do for the pain he  seen neurologist this morning and explained that he can not sleep due to pain and was referred to pain management specialist. But patient states he cannot wait for a new medication till he sees them and would like something sent in sooner.

## 2021-06-30 ENCOUNTER — Other Ambulatory Visit: Payer: Self-pay | Admitting: Family Medicine

## 2021-06-30 DIAGNOSIS — I509 Heart failure, unspecified: Secondary | ICD-10-CM

## 2021-07-01 NOTE — Telephone Encounter (Signed)
See below

## 2021-07-01 NOTE — Telephone Encounter (Signed)
Patient calling in stated he is in a lot of pain. Patient wants to see if another provider could send anything in. Please advise.

## 2021-07-02 NOTE — Telephone Encounter (Signed)
We sent some percocet about 6 weeks ago. I can refill this again if he wishes but I can't do anything stronger.  [name]. [name], MD 07/02/2021 1:33 PM

## 2021-07-02 NOTE — Telephone Encounter (Signed)
See below

## 2021-07-03 MED ORDER — OXYCODONE-ACETAMINOPHEN 10-325 MG PO TABS: 1.0000 | ORAL_TABLET | Freq: Three times a day (TID) | ORAL | 0 refills | Status: DC | PRN

## 2021-07-03 NOTE — Addendum Note (Signed)
Addended by: Lieutenant Diego A on: 07/03/2021 08:48 AM   Modules accepted: Orders

## 2021-07-03 NOTE — Telephone Encounter (Signed)
Called and spoke with pt and he would like a refill.

## 2021-07-03 NOTE — Addendum Note (Signed)
Addended by: Ardith Dark on: 07/03/2021 09:04 AM   Modules accepted: Orders

## 2021-08-01 ENCOUNTER — Other Ambulatory Visit: Payer: Self-pay | Admitting: Family Medicine

## 2021-08-01 DIAGNOSIS — E876 Hypokalemia: Secondary | ICD-10-CM

## 2021-08-28 ENCOUNTER — Other Ambulatory Visit: Payer: Self-pay

## 2021-08-28 ENCOUNTER — Ambulatory Visit (INDEPENDENT_AMBULATORY_CARE_PROVIDER_SITE_OTHER): Payer: BC Managed Care – PPO | Admitting: Podiatry

## 2021-08-28 DIAGNOSIS — I872 Venous insufficiency (chronic) (peripheral): Secondary | ICD-10-CM

## 2021-08-28 DIAGNOSIS — Z794 Long term (current) use of insulin: Secondary | ICD-10-CM

## 2021-08-28 DIAGNOSIS — E1165 Type 2 diabetes mellitus with hyperglycemia: Secondary | ICD-10-CM

## 2021-08-28 DIAGNOSIS — R609 Edema, unspecified: Secondary | ICD-10-CM

## 2021-08-28 DIAGNOSIS — M79675 Pain in left toe(s): Secondary | ICD-10-CM

## 2021-08-28 NOTE — Progress Notes (Addendum)
This patient returns to my office for at risk foot care.  This patient requires this care by a professional since this patient will be at risk due to having diabetes and venous stasis.  This patient is unable to cut nails himself since the patient cannot reach his nails.These nails are painful walking and wearing shoes.  This patient presents for at risk foot care today.  General Appearance  Alert, conversant and in no acute stress.  Vascular  Dorsalis pedis and posterior tibial  pulses are not palpable  bilaterally due to severe swelling..  Capillary return is within normal limits  bilaterally. Temperature is within normal limits  bilaterally.  Neurologic  Senn-Weinstein monofilament wire test within normal limits  bilaterally. Muscle power within normal limits bilaterally.  Nails Thick disfigured discolored nails with subungual debris  from hallux to fifth toes bilaterally. No evidence of bacterial infection or drainage bilaterally.  Orthopedic  No limitations of motion  feet .  No crepitus or effusions noted.  No bony pathology or digital deformities noted.  Skin  normotropic skin with no porokeratosis noted bilaterally.  No signs of infections or ulcers noted.     Onychomycosis  Pain in right toes  Pain in left toes  Swelling legs/feet  Consent was obtained for treatment procedures.   Mechanical debridement of nails 1-5  bilaterally performed with a nail nipper.  Filed with dremel without incident.    Return office visit   3 months                   Told patient to return for periodic foot care and evaluation due to potential at risk complications.   Brilyn Tuller DPM  

## 2021-08-29 ENCOUNTER — Encounter: Payer: BC Managed Care – PPO | Admitting: Family Medicine

## 2021-09-18 ENCOUNTER — Encounter: Payer: BC Managed Care – PPO | Admitting: Family Medicine

## 2021-09-19 ENCOUNTER — Encounter: Payer: BC Managed Care – PPO | Admitting: Family Medicine

## 2021-10-03 ENCOUNTER — Other Ambulatory Visit: Payer: Self-pay

## 2021-10-03 ENCOUNTER — Encounter: Payer: Self-pay | Admitting: Family Medicine

## 2021-10-03 ENCOUNTER — Ambulatory Visit (INDEPENDENT_AMBULATORY_CARE_PROVIDER_SITE_OTHER): Payer: BC Managed Care – PPO | Admitting: Family Medicine

## 2021-10-03 VITALS — BP 166/98 | HR 66 | Temp 97.9°F | Ht 75.0 in | Wt 399.8 lb

## 2021-10-03 DIAGNOSIS — E785 Hyperlipidemia, unspecified: Secondary | ICD-10-CM

## 2021-10-03 DIAGNOSIS — Z125 Encounter for screening for malignant neoplasm of prostate: Secondary | ICD-10-CM | POA: Diagnosis not present

## 2021-10-03 DIAGNOSIS — R55 Syncope and collapse: Secondary | ICD-10-CM | POA: Diagnosis not present

## 2021-10-03 DIAGNOSIS — I480 Paroxysmal atrial fibrillation: Secondary | ICD-10-CM

## 2021-10-03 DIAGNOSIS — E1169 Type 2 diabetes mellitus with other specified complication: Secondary | ICD-10-CM | POA: Diagnosis not present

## 2021-10-03 DIAGNOSIS — E1159 Type 2 diabetes mellitus with other circulatory complications: Secondary | ICD-10-CM

## 2021-10-03 DIAGNOSIS — Z0001 Encounter for general adult medical examination with abnormal findings: Secondary | ICD-10-CM

## 2021-10-03 DIAGNOSIS — E1165 Type 2 diabetes mellitus with hyperglycemia: Secondary | ICD-10-CM

## 2021-10-03 DIAGNOSIS — I428 Other cardiomyopathies: Secondary | ICD-10-CM

## 2021-10-03 DIAGNOSIS — R600 Localized edema: Secondary | ICD-10-CM

## 2021-10-03 DIAGNOSIS — Z23 Encounter for immunization: Secondary | ICD-10-CM | POA: Diagnosis not present

## 2021-10-03 DIAGNOSIS — Z794 Long term (current) use of insulin: Secondary | ICD-10-CM

## 2021-10-03 DIAGNOSIS — I152 Hypertension secondary to endocrine disorders: Secondary | ICD-10-CM

## 2021-10-03 DIAGNOSIS — I10 Essential (primary) hypertension: Secondary | ICD-10-CM

## 2021-10-03 LAB — LIPID PANEL
Cholesterol: 140 mg/dL (ref 0–200)
HDL: 33.8 mg/dL — ABNORMAL LOW (ref >39.00)
LDL Cholesterol: 89 mg/dL (ref 0–99)
NonHDL: 106.05
Total CHOL/HDL Ratio: 4
Triglycerides: 84 mg/dL (ref 0.0–149.0)
VLDL: 16.8 mg/dL (ref 0.0–40.0)

## 2021-10-03 LAB — COMPREHENSIVE METABOLIC PANEL
ALT: 32 U/L (ref 0–53)
AST: 22 U/L (ref 0–37)
Albumin: 3.8 g/dL (ref 3.5–5.2)
Alkaline Phosphatase: 155 U/L — ABNORMAL HIGH (ref 39–117)
BUN: 13 mg/dL (ref 6–23)
CO2: 27 mEq/L (ref 19–32)
Calcium: 8.9 mg/dL (ref 8.4–10.5)
Chloride: 107 mEq/L (ref 96–112)
Creatinine, Ser: 1.07 mg/dL (ref 0.40–1.50)
GFR: 79.16 mL/min (ref >60.00)
Glucose, Bld: 69 mg/dL — ABNORMAL LOW (ref 70–99)
Potassium: 3.9 mEq/L (ref 3.5–5.1)
Sodium: 141 mEq/L (ref 135–145)
Total Bilirubin: 0.5 mg/dL (ref 0.2–1.2)
Total Protein: 7.2 g/dL (ref 6.0–8.3)

## 2021-10-03 LAB — CBC
HCT: 41 % (ref 39.0–52.0)
Hemoglobin: 13.6 g/dL (ref 13.0–17.0)
MCHC: 33.1 g/dL (ref 30.0–36.0)
MCV: 96.4 fl (ref 78.0–100.0)
Platelets: 261 10*3/uL (ref 150.0–400.0)
RBC: 4.25 Mil/uL (ref 4.22–5.81)
RDW: 15 % (ref 11.5–15.5)
WBC: 8.7 10*3/uL (ref 4.0–10.5)

## 2021-10-03 LAB — PSA: PSA: 1.18 ng/mL (ref 0.10–4.00)

## 2021-10-03 LAB — HEMOGLOBIN A1C: Hgb A1c MFr Bld: 7.4 % — ABNORMAL HIGH (ref 4.6–6.5)

## 2021-10-03 LAB — TSH: TSH: 1.72 u[IU]/mL (ref 0.35–5.50)

## 2021-10-03 NOTE — Assessment & Plan Note (Signed)
Recommended salt avoidance.

## 2021-10-03 NOTE — Assessment & Plan Note (Signed)
Check A1c.  He has not been on Ozempic due to financial concerns.  He is on insulin 70/30 24 units twice daily.

## 2021-10-03 NOTE — Assessment & Plan Note (Signed)
Regular rate and rhythm today.  He needs follow-up with cardiology.  Will place referral today.

## 2021-10-03 NOTE — Progress Notes (Signed)
Chief Complaint:  Luis [name] is a 55 y.o. male who presents today for his annual comprehensive physical exam.    Assessment/Plan:  New/Acute Problems: Syncope History is consistent with possible vasovagal syncope however has a significant cardiac history. EKG today is reassuring.  We will place referral to cardiology.  Dermatitis Recommend over-the-counter cortisone cream.  Can consider referral triamcinolone if no improvement with cortisone.  Chronic Problems Addressed Today: Paroxysmal atrial fibrillation (HCC) Regular rate and rhythm today.  He needs follow-up with cardiology.  Will place referral today.  Hypertension associated with diabetes (HCC) Above goal though typically well controlled.  Patient does have whitecoat hypertension.  We will continue Metroprolol tartrate 50 mg twice daily.  Continue home monitoring.  Recheck in 6 months.  Morbid obesity (HCC) Not able to stay on Ozempic due to financial concerns.  Discussed lifestyle modifications.   Diabetes (HCC) Check A1c.  He has not been on Ozempic due to financial concerns.  He is on insulin 70/30 24 units twice daily.  Hyperlipidemia associated with type 2 diabetes mellitus (HCC) On simvastatin 20 mg daily.  Check lipids.  NICM (nonischemic cardiomyopathy) (HCC), left heart cath and coronary angiography 2017 Will place referral to cardiology.  Bilateral edema of lower extremity Recommended salt avoidance.  Preventative Healthcare: Flu vaccine given today. Will get blood work done today.  Discussed colon cancer screening - he will call to schedule  Patient Counseling(The following topics were reviewed and/or handout was given):  -Nutrition: Stressed importance of moderation in sodium/caffeine intake, saturated fat and cholesterol, caloric balance, sufficient intake of fresh fruits, vegetables, and fiber.  -Stressed the importance of regular exercise.   -Substance Abuse: Discussed cessation/primary  prevention of tobacco, alcohol, or other drug use; driving or other dangerous activities under the influence; availability of treatment for abuse.   -Injury prevention: Discussed safety belts, safety helmets, smoke detector, smoking near bedding or upholstery.   -Sexuality: Discussed sexually transmitted diseases, partner selection, use of condoms, avoidance of unintended pregnancy and contraceptive alternatives.   -Dental health: Discussed importance of regular tooth brushing, flossing, and dental visits.  -Health maintenance and immunizations reviewed. Please refer to Health maintenance section.  Return to care in 1 year for next preventative visit.     Subjective:  HPI:  He has no acute complaints today. See A/p for status of chronic conditions.    He has had a few episodes of syncope for the past 4 months. This started with dry cough. He notes cough started after the nasal congestion. His recent episode was on September 28, 2021. Denies any injury with the episodes. Denies chest pain or shortness of breath. No nausea or vomiting. No palpitions.  Symptoms only occur after coughing fit.  Does not have any symptoms otherwise. HE loses consciousness for 20-30 seconds each time.   He also have cyst on the right buttock.He underwent surgical drainage few years ago for this issue. He is experiencing similar symptoms. He has not tried any medication to help with the symptoms. He denies any pain in the area.   Lifestyle Diet: Trying to follow up low sodium diet. Exercise: None specific   Depression screen [facility] 2/9 05/27/2021  Decreased Interest 0  Down, Depressed, Hopeless 0  PHQ - 2 Score 0  Altered sleeping -  Tired, decreased energy -  Change in appetite -  Feeling bad or failure about yourself  -  Trouble concentrating -  Moving slowly or fidgety/restless -  Suicidal thoughts -  PHQ-9  Score -  Difficult doing work/chores -    Health Maintenance Due  Topic Date Due   Pneumococcal  Vaccine 16-79 Years old (1 - PCV) Never done   Hepatitis C Screening  Never done   TETANUS/TDAP  Never done   COLONOSCOPY (Pts 45-72yrs Insurance coverage will need to be confirmed)  Never done   Zoster Vaccines- Shingrix (1 of 2) Never done   OPHTHALMOLOGY EXAM  04/15/2019   COVID-19 Vaccine (3 - Booster for Pfizer series) 04/10/2020   INFLUENZA VACCINE  06/03/2021   HEMOGLOBIN A1C  08/23/2021   URINE MICROALBUMIN  08/23/2021     ROS: Per HPI, otherwise a complete review of systems was negative.   PMH:  The following were reviewed and entered/updated in epic: Past Medical History:  Diagnosis Date   Chronic combined systolic and diastolic CHF (congestive heart failure) ([place])    Difficult intubation 12/06/2020   DM (diabetes mellitus) ([place])    Essential hypertension    History of tobacco abuse    HTN (hypertension)    Hyperlipidemia    Mild CAD    Morbid obesity ([place])    NICM (nonischemic cardiomyopathy) ([place])    Noncompliance    OSA (obstructive sleep apnea)    Pneumonia 2017   Premature atrial contractions    Sleep apnea    Patient Active Problem List   Diagnosis Date Noted   Swelling 08/28/2021   Morbid obesity ([place]) 05/27/2021   Difficult intubation 12/06/2020   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   Adjustment reaction 04/10/2020   Venous stasis dermatitis of both lower extremities 11/10/2019   Pain due to onychomycosis of toenails of both feet 04/29/2019   Diabetes ([place]) 02/02/2018   Renal insufficiency 01/01/2018   Onychomycosis, followed by Podiatry 06/14/2017   Bilateral edema of lower extremity 06/14/2017   Hyperlipidemia associated with type 2 diabetes mellitus ([place])    Difficulty walking, debility, deconditioned 06/11/2017   OSA (obstructive sleep apnea), mild, not using CPAP 03/31/2017   NICM (nonischemic cardiomyopathy) ([place]), left heart cath and coronary angiography 2017    Former smoker    Chronic diastolic CHF (congestive heart failure) ([place]), ECHO 03/2017 EF  50-55%, on Torsemide    Hypertension associated with diabetes ([place]) 03/19/2016   Paroxysmal atrial fibrillation ([place]) 03/19/2016   Past Surgical History:  Procedure Laterality Date   CARDIAC CATHETERIZATION N/A 03/24/2016   Procedure: Left Heart Cath and Coronary Angiography;  Surgeon: Leonie Man, MD;  Location: [facility];  Service: Cardiovascular;  Laterality: N/A;   FEMUR IM NAIL Right 2012   FRACTURE SURGERY      Family History  Problem Relation Age of Onset   Diabetes Mellitus II Mother    Diabetes type II Other    Hypertension Maternal Grandfather    Stroke Neg Hx    Cancer Neg Hx    Colon cancer Neg Hx    Colon polyps Neg Hx    Esophageal cancer Neg Hx    Rectal cancer Neg Hx    Stomach cancer Neg Hx     Medications- reviewed and updated Current Outpatient Medications  Medication Sig Dispense Refill   aspirin EC 81 MG EC tablet Take 1 tablet (81 mg total) by mouth daily. 30 tablet 0   citalopram (CELEXA) 20 MG tablet TAKE 1 TABLET(20 MG) BY MOUTH DAILY 30 tablet 3   glucose blood test strip Check glucose TID/ E11.9 100 each 12   ibuprofen (ADVIL) 800 MG tablet Take 1 tablet (800 mg total)  by mouth every 8 (eight) hours as needed. 60 tablet 0   insulin aspart protamine - aspart (NOVOLOG MIX 70/30 FLEXPEN) (70-30) 100 UNIT/ML FlexPen Inject 0.2 mLs (20 Units total) into the skin 2 (two) times daily. 15 mL 11   Insulin Pen Needle 31G X 8 MM MISC Use to inject insulin twice daily 100 each 11   Insulin Pen Needle 32G X 6 MM MISC Use to administer insulin two times daily 90 each 11   metoprolol tartrate (LOPRESSOR) 50 MG tablet TAKE 1 TABLET(50 MG) BY MOUTH TWICE DAILY 180 tablet 3   OneTouch Delica Lancets [id number] MISC Use TID/ E11.9 100 each 5   oxyCODONE-acetaminophen (PERCOCET) 10-325 MG tablet Take 1 tablet by mouth every 8 (eight) hours as needed for pain. 30 tablet 0   potassium chloride SA (KLOR-CON) 20 MEQ tablet TAKE 1 TABLET(20 MEQ) BY MOUTH DAILY 30 tablet  2   simvastatin (ZOCOR) 20 MG tablet TAKE 1 TABLET(20 MG) BY MOUTH AT BEDTIME 90 tablet 1   Sodium Sulfate-Mag Sulfate-KCl (SUTAB) [phone] MG TABS Take 24 tablets by mouth as directed. MANUFACTURER CODES!! BIN: [id number] PCN: CN GROUP: [id number] MEMBER ID: [id number] AS SECONDARY INSURANCE ;NO PRIOR AUTHORIZATION 24 tablet 0   torsemide (DEMADEX) 10 MG tablet TAKE 1 TO 2 TABLETS BY MOUTH DAILY FOR SWELLING 60 tablet 2   Semaglutide,0.25 or 0.5MG /DOS, (OZEMPIC, 0.25 OR 0.5 MG/DOSE,) 2 MG/1.5ML SOPN Inject 1 mg into the skin once a week. (Patient not taking: Reported on 10/03/2021) 1.5 mL 3   No current facility-administered medications for this visit.    Allergies-reviewed and updated Allergies  Allergen Reactions   Demerol [Meperidine] Itching   Morphine And Related Itching    Social History   Socioeconomic History   Marital status: Single    Spouse name: Not on file   Number of children: 1   Years of education: 14   Highest education level: Not on file  Occupational History   Occupation: [employer]  Tobacco Use   Smoking status: Former    Packs/day: 0.50    Years: 20.00    Pack years: 10.00    Types: Cigarettes    Quit date: 03/02/2009    Years since quitting: 12.5   Smokeless tobacco: Never  Vaping Use   Vaping Use: Never used  Substance and Sexual Activity   Alcohol use: No   Drug use: No   Sexual activity: Never  Other Topics Concern   Not on file  Social History Narrative   ** Merged History Encounter **       Lives in [place] with mother and three grandchildren.     Works for [employer] - works from home.     Does not routinely exercise.   Caffeine use: none   Fun/Hobby: Play video games and play with grandchildren.    Social Determinants of Health   Financial Resource Strain: Not on file  Food Insecurity: Not on file  Transportation Needs: Not on file  Physical Activity: Not on file  Stress: Not on file  Social Connections: Not on file         Objective:  Physical Exam: BP (!) 166/98   Pulse 66   Temp 97.9 F (36.6 C) (Temporal)   Ht 6\' 3"  (1.905 m)   Wt (!) 399 lb 12.8 oz (181.3 kg)   SpO2 99%   BMI 49.97 kg/m   Body mass index is 49.97 kg/m. Wt Readings from Last 3 Encounters:  10/03/21 Marland Kitchen)  399 lb 12.8 oz (181.3 kg)  05/27/21 (!) 374 lb (169.6 kg)  05/02/21 (!) 371 lb 14.7 oz (168.7 kg)   Gen: NAD, resting comfortably HEENT: TMs normal bilaterally. OP clear. No thyromegaly noted.  CV: RRR with no murmurs appreciated Pulm: NWOB, CTAB with no crackles, wheezes, or rhonchi GI: Normal bowel sounds present. Soft, Nontender, Nondistended. MSK: no edema, cyanosis, or clubbing noted Skin: warm, dry. No signs of infection on gluteal cleft.  Has some signs of dermatitis  and hypertrophic skin. Neuro: CN2-12 grossly intact. Strength 5/5 in upper and lower extremities. Reflexes symmetric and intact bilaterally.  Psych: Normal affect and thought content  EKG: NSR. No ischemic changes.      I,Savera Zaman,acting as a Education administrator for Dimas Chyle, MD.,have documented all relevant documentation on the behalf of Dimas Chyle, MD,as directed by  Dimas Chyle, MD while in the presence of Dimas Chyle, MD.   I, Dimas Chyle, MD, have reviewed all documentation for this visit. The documentation on 10/03/21 for the exam, diagnosis, procedures, and orders are all accurate and complete.  Algis Greenhouse. [name], MD 10/03/2021 2:31 PM

## 2021-10-03 NOTE — Patient Instructions (Signed)
It was very nice to see you today!  We will refer you to see a cardiologist.  We will check blood work today.  Please continue working on diet and exercise.  We will give your flu shot today.  We will see back in 3 to 6 months depending on results your blood work.  Take care, Dr [name]  PLEASE NOTE:  If you had any lab tests please let us know if you have not heard back within a few days. You may see your results on mychart before we have a chance to review them but we will give you a call once they are reviewed by [place]. If we ordered any referrals today, please let us know if you have not heard from their office within the next week.   Please try these tips to maintain a healthy lifestyle:  Eat at least 3 REAL meals and 1-2 snacks per day.  Aim for no more than 5 hours between eating.  If you eat breakfast, please do so within one hour of getting up.   Each meal should contain half fruits/vegetables, one quarter protein, and one quarter carbs (no bigger than a computer mouse)  Cut down on sweet beverages. This includes juice, soda, and sweet tea.   Drink at least 1 glass of water with each meal and aim for at least 8 glasses per day  Exercise at least 150 minutes every week.    Preventive Care 31-51 Years Old, Male Preventive care refers to lifestyle choices and visits with your health care provider that can promote health and wellness. Preventive care visits are also called wellness exams. What can I expect for my preventive care visit? Counseling During your preventive care visit, your health care provider may ask about your: Medical history, including: Past medical problems. Family medical history. Current health, including: Emotional well-being. Home life and relationship well-being. Sexual activity. Lifestyle, including: Alcohol, nicotine or tobacco, and drug use. Access to firearms. Diet, exercise, and sleep habits. Safety issues such as seatbelt and bike helmet  use. Sunscreen use. Work and work Statistician. Physical exam Your health care provider will check your: Height and weight. These may be used to calculate your BMI (body mass index). BMI is a measurement that tells if you are at a healthy weight. Waist circumference. This measures the distance around your waistline. This measurement also tells if you are at a healthy weight and may help predict your risk of certain diseases, such as type 2 diabetes and high blood pressure. Heart rate and blood pressure. Body temperature. Skin for abnormal spots. What immunizations do I need? Vaccines are usually given at various ages, according to a schedule. Your health care provider will recommend vaccines for you based on your age, medical history, and lifestyle or other factors, such as travel or where you work. What tests do I need? Screening Your health care provider may recommend screening tests for certain conditions. This may include: Lipid and cholesterol levels. Diabetes screening. This is done by checking your blood sugar (glucose) after you have not eaten for a while (fasting). Hepatitis B test. Hepatitis C test. HIV (human immunodeficiency virus) test. STI (sexually transmitted infection) testing, if you are at risk. Lung cancer screening. Prostate cancer screening. Colorectal cancer screening. Talk with your health care provider about your test results, treatment options, and if necessary, the need for more tests. Follow these instructions at home: Eating and drinking  Eat a diet that includes fresh fruits and vegetables, whole grains,  lean protein, and low-fat dairy products. Take vitamin and mineral supplements as recommended by your health care provider. Do not drink alcohol if your health care provider tells you not to drink. If you drink alcohol: Limit how much you have to 0-2 drinks a day. Know how much alcohol is in your drink. In the [place] one drink equals one 12 oz bottle of  beer (355 mL), one 5 oz glass of wine (148 mL), or one 1 oz glass of hard liquor (44 mL). Lifestyle Brush your teeth every morning and night with fluoride toothpaste. Floss one time each day. Exercise for at least 30 minutes 5 or more days each week. Do not use any products that contain nicotine or tobacco. These products include cigarettes, chewing tobacco, and vaping devices, such as e-cigarettes. If you need help quitting, ask your health care provider. Do not use drugs. If you are sexually active, practice safe sex. Use a condom or other form of protection to prevent STIs. Take aspirin only as told by your health care provider. Make sure that you understand how much to take and what form to take. Work with your health care provider to find out whether it is safe and beneficial for you to take aspirin daily. Find healthy ways to manage stress, such as: Meditation, yoga, or listening to music. Journaling. Talking to a trusted person. Spending time with friends and family. Minimize exposure to UV radiation to reduce your risk of skin cancer. Safety Always wear your seat belt while driving or riding in a vehicle. Do not drive: If you have been drinking alcohol. Do not ride with someone who has been drinking. When you are tired or distracted. While texting. If you have been using any mind-altering substances or drugs. Wear a helmet and other protective equipment during sports activities. If you have firearms in your house, make sure you follow all gun safety procedures. What's next? Go to your health care provider once a year for an annual wellness visit. Ask your health care provider how often you should have your eyes and teeth checked. Stay up to date on all vaccines. This information is not intended to replace advice given to you by your health care provider. Make sure you discuss any questions you have with your health care provider. Document Revised: 04/17/2021 Document Reviewed:  04/17/2021 Elsevier Patient Education  2022 [employer].

## 2021-10-03 NOTE — Assessment & Plan Note (Signed)
Above goal though typically well controlled.  Patient does have whitecoat hypertension.  We will continue Metroprolol tartrate 50 mg twice daily.  Continue home monitoring.  Recheck in 6 months.

## 2021-10-03 NOTE — Assessment & Plan Note (Signed)
On simvastatin 20 mg daily Check lipids 

## 2021-10-03 NOTE — Assessment & Plan Note (Signed)
Not able to stay on Ozempic due to financial concerns.  Discussed lifestyle modifications.

## 2021-10-03 NOTE — Assessment & Plan Note (Signed)
Will place referral to cardiology. 

## 2021-10-04 ENCOUNTER — Other Ambulatory Visit: Payer: Self-pay | Admitting: Family Medicine

## 2021-10-04 ENCOUNTER — Telehealth: Payer: Self-pay

## 2021-10-04 DIAGNOSIS — I509 Heart failure, unspecified: Secondary | ICD-10-CM

## 2021-10-04 LAB — MICROALBUMIN / CREATININE URINE RATIO
Creatinine,U: 103.8 mg/dL
Microalb Creat Ratio: 3.4 mg/g (ref 0.0–30.0)
Microalb, Ur: 3.6 mg/dL — ABNORMAL HIGH (ref 0.0–1.9)

## 2021-10-04 NOTE — Telephone Encounter (Signed)
Pt called regarding his prescription for Torsemide. He stated that he cannot remember where he has placed the medication. Pt would like a refill sent to the pharmacy listed. Please Advise.

## 2021-10-07 NOTE — Progress Notes (Signed)
Please inform patient of the following:  His A1c is up a little bit at 7.4 but overall is not bad. Everything else is stable. Would like for him to continue his current treatment plan at we can recheck his a1c in 3-6 months.  [name]. [name], MD 10/07/2021 12:58 PM

## 2021-10-08 ENCOUNTER — Telehealth: Payer: Self-pay

## 2021-10-08 NOTE — Telephone Encounter (Signed)
Patient is returning a call about lab results. Luis [name] is requesting a phone call in return possibly after [phone]

## 2021-10-08 NOTE — Telephone Encounter (Signed)
Spoke with patient, stated he pick up prescription

## 2021-10-09 NOTE — Telephone Encounter (Signed)
See results note. 

## 2021-10-24 ENCOUNTER — Encounter: Payer: BC Managed Care – PPO | Admitting: Family Medicine

## 2021-10-26 ENCOUNTER — Other Ambulatory Visit: Payer: Self-pay | Admitting: Family Medicine

## 2021-10-26 DIAGNOSIS — E876 Hypokalemia: Secondary | ICD-10-CM

## 2021-11-05 ENCOUNTER — Telehealth: Payer: Self-pay | Admitting: Family Medicine

## 2021-11-05 NOTE — Telephone Encounter (Signed)
Pt called and said he needs a refill on his test strips, I did advise him to contact the pharmacy to send in a refill request and he said he would and asked if I could still send back a note

## 2021-11-06 ENCOUNTER — Other Ambulatory Visit: Payer: Self-pay

## 2021-11-08 ENCOUNTER — Other Ambulatory Visit: Payer: Self-pay | Admitting: *Deleted

## 2021-11-08 MED ORDER — GLUCOSE BLOOD VI STRP: ORAL_STRIP | 12 refills | Status: DC

## 2021-11-08 NOTE — Telephone Encounter (Signed)
Test strip send to [facility]

## 2021-11-24 ENCOUNTER — Other Ambulatory Visit: Payer: Self-pay | Admitting: Family Medicine

## 2021-11-24 DIAGNOSIS — E782 Mixed hyperlipidemia: Secondary | ICD-10-CM

## 2021-11-28 ENCOUNTER — Other Ambulatory Visit: Payer: Self-pay

## 2021-11-28 ENCOUNTER — Encounter: Payer: Self-pay | Admitting: Cardiovascular Disease

## 2021-11-28 ENCOUNTER — Ambulatory Visit (INDEPENDENT_AMBULATORY_CARE_PROVIDER_SITE_OTHER): Payer: BC Managed Care – PPO | Admitting: Cardiovascular Disease

## 2021-11-28 VITALS — BP 152/100 | HR 80 | Ht 75.0 in | Wt 390.4 lb

## 2021-11-28 DIAGNOSIS — I5022 Chronic systolic (congestive) heart failure: Secondary | ICD-10-CM | POA: Diagnosis not present

## 2021-11-28 DIAGNOSIS — R55 Syncope and collapse: Secondary | ICD-10-CM | POA: Diagnosis not present

## 2021-11-28 DIAGNOSIS — I10 Essential (primary) hypertension: Secondary | ICD-10-CM | POA: Diagnosis not present

## 2021-11-28 MED ORDER — CARVEDILOL 12.5 MG PO TABS: 12.5000 mg | ORAL_TABLET | Freq: Two times a day (BID) | ORAL | 1 refills | Status: DC

## 2021-11-28 NOTE — Progress Notes (Signed)
Cardiology Office Note   Date:  11/28/2021   ID:  UNKNOWN SCHLEYER, DOB 1966-09-10, MRN [id number]  PCP:  Ardith Dark, MD  Cardiologist:   Lorine Bears, MD   Chief Complaint  Patient presents with   Other    Syncope c/o spacing out almost like pt was having seizure and had to gather himself. Meds reviewed verbally with pt.      History of Present Illness: Luis [name] is a 56 y.o. male who was referred by Dr. [name] for evaluation of syncope and nonischemic cardiomyopathy. Has known history of chronic systolic heart failure due to nonischemic cardiomyopathy mild nonobstructive coronary artery disease, essential hypertension, obesity, diabetes mellitus, sleep apnea and medical noncompliance. Echocardiogram in 2017 showed an EF of 35 to 40%.  Cardiac catheterization showed only mild LAD stenosis that was not obstructive. The patient is to be followed by Dr. Clifton James but has not been seen since 2018.  Most recent echocardiogram in 2018 showed an EF of 50 to 55%. He had a syncopal episode in November.  He had a cold at that time with significant coughing spells.  After prolonged coughing, he had loss of consciousness.  There was some shaking but no other symptoms suggestive of a seizure.  The patient had similar episodes in the past.  Otherwise he has been doing reasonably well and denies chest pain or significant dyspnea.  He does have mild leg edema controlled with torsemide.  He does not smoke.  Past Medical History:  Diagnosis Date   Chronic combined systolic and diastolic CHF (congestive heart failure) (HCC)    Difficult intubation 12/06/2020   DM (diabetes mellitus) (HCC)    Essential hypertension    History of tobacco abuse    HTN (hypertension)    Hyperlipidemia    Mild CAD    Morbid obesity (HCC)    NICM (nonischemic cardiomyopathy) (HCC)    Noncompliance    OSA (obstructive sleep apnea)    Pneumonia 2017   Premature atrial contractions    Sleep apnea      Past Surgical History:  Procedure Laterality Date   CARDIAC CATHETERIZATION N/A 03/24/2016   Procedure: Left Heart Cath and Coronary Angiography;  Surgeon: Marykay Lex, MD;  Location: [facility] INVASIVE CV LAB;  Service: Cardiovascular;  Laterality: N/A;   FEMUR IM NAIL Right 2012   FRACTURE SURGERY       Current Outpatient Medications  Medication Sig Dispense Refill   aspirin EC 81 MG EC tablet Take 1 tablet (81 mg total) by mouth daily. 30 tablet 0   glucose blood test strip Check glucose TID/ E11.9 100 each 12   ibuprofen (ADVIL) 800 MG tablet Take 1 tablet (800 mg total) by mouth every 8 (eight) hours as needed. 60 tablet 0   insulin aspart protamine - aspart (NOVOLOG MIX 70/30 FLEXPEN) (70-30) 100 UNIT/ML FlexPen Inject 0.2 mLs (20 Units total) into the skin 2 (two) times daily. 15 mL 11   Insulin Pen Needle 31G X 8 MM MISC Use to inject insulin twice daily 100 each 11   Insulin Pen Needle 32G X 6 MM MISC Use to administer insulin two times daily 90 each 11   metoprolol tartrate (LOPRESSOR) 50 MG tablet TAKE 1 TABLET(50 MG) BY MOUTH TWICE DAILY 180 tablet 3   OneTouch Delica Lancets 33G MISC Use TID/ E11.9 100 each 5   potassium chloride SA (KLOR-CON M) 20 MEQ tablet TAKE 1 TABLET BY MOUTH EVERY DAY 30  tablet 2   simvastatin (ZOCOR) 20 MG tablet TAKE 1 TABLET(20 MG) BY MOUTH AT BEDTIME 90 tablet 1   torsemide (DEMADEX) 10 MG tablet TAKE 1 TO 2 TABLETS BY MOUTH DAILY FOR SWELLING 60 tablet 2   citalopram (CELEXA) 20 MG tablet TAKE 1 TABLET(20 MG) BY MOUTH DAILY (Patient not taking: Reported on 11/28/2021) 30 tablet 3   oxyCODONE-acetaminophen (PERCOCET) 10-325 MG tablet Take 1 tablet by mouth every 8 (eight) hours as needed for pain. (Patient not taking: Reported on 11/28/2021) 30 tablet 0   Semaglutide,0.25 or 0.5MG /DOS, (OZEMPIC, 0.25 OR 0.5 MG/DOSE,) 2 MG/1.5ML SOPN Inject 1 mg into the skin once a week. (Patient not taking: Reported on 10/03/2021) 1.5 mL 3   Sodium Sulfate-Mag  Sulfate-KCl (SUTAB) [phone] MG TABS Take 24 tablets by mouth as directed. MANUFACTURER CODES!! BIN: [id number] PCN: CN GROUP: [id number] MEMBER ID: [id number];DVV AS SECONDARY INSURANCE ;NO PRIOR AUTHORIZATION (Patient not taking: Reported on 11/28/2021) 24 tablet 0   No current facility-administered medications for this visit.    Allergies:   Demerol [meperidine] and Morphine and related    Social History:  The patient  reports that he quit smoking about 12 years ago. His smoking use included cigarettes. He has a 10.00 pack-year smoking history. He has never used smokeless tobacco. He reports that he does not drink alcohol and does not use drugs.   Family History:  The patient's family history includes Diabetes Mellitus II in his mother; Diabetes type II in an other family member; Hypertension in his maternal grandfather.    ROS:  Please see the history of present illness.   Otherwise, review of systems are positive for none.   All other systems are reviewed and negative.    PHYSICAL EXAM: VS:  BP (!) 152/100 (BP Location: Left Arm, Patient Position: Sitting, Cuff Size: Large)    Pulse 80    Ht 6\' 3"  (1.905 m)    Wt (!) 390 lb 6 oz (177.1 kg)    SpO2 99%    BMI 48.79 kg/m  , BMI Body mass index is 48.79 kg/m. GEN: Well nourished, well developed, in no acute distress  HEENT: normal  Neck: no JVD, carotid bruits, or masses Cardiac: RRR; no murmurs, rubs, or gallops,no edema  Respiratory:  clear to auscultation bilaterally, normal work of breathing GI: soft, nontender, nondistended, + BS MS: no deformity or atrophy  Skin: warm and dry, no rash Neuro:  Strength and sensation are intact Psych: euthymic mood, full affect   EKG:  EKG is not ordered today. Recent EKG was reviewed and showed sinus rhythm with no significant ST changes.   Recent Labs: 10/03/2021: ALT 32; BUN 13; Creatinine, Ser 1.07; Hemoglobin 13.6; Platelets 261.0; Potassium 3.9; Sodium 141; TSH 1.72    Lipid Panel     Component Value Date/Time   CHOL 140 10/03/2021 1444   TRIG 84.0 10/03/2021 1444   HDL 33.80 (L) 10/03/2021 1444   CHOLHDL 4 10/03/2021 1444   VLDL 16.8 10/03/2021 1444   LDLCALC 89 10/03/2021 1444   LDLCALC 96 08/23/2020 1002      Wt Readings from Last 3 Encounters:  11/28/21 (!) 390 lb 6 oz (177.1 kg)  10/03/21 (!) 399 lb 12.8 oz (181.3 kg)  05/27/21 (!) 374 lb (169.6 kg)       No flowsheet data found.    ASSESSMENT AND PLAN:  1.  Tussive syncope likely vasovagal: He has no symptoms suggestive of arrhythmia.  I reassured him.  2.  Chronic systolic heart failure: He has known history of nonischemic cardiomyopathy with no recent EF evaluation.  I requested an echocardiogram.  I elected to switch metoprolol to carvedilol for better blood pressure control.  If his ejection fraction is reduced, we will have to start him on Entresto and consider adding an SGLT2 inhibitor.  He appears to be euvolemic on torsemide 10 mg twice daily.  3.  Sleep apnea: He reports intolerance to CPAP.  4.  Essential hypertension: Blood pressure is not controlled.  I switched metoprolol to carvedilol with plans to add additional medications based on his echocardiogram.    Disposition:   FU with me in 1 month  Signed,  Lorine Bears, MD  11/28/2021 9:07 AM    [facility]

## 2021-11-28 NOTE — Patient Instructions (Signed)
Medication Instructions:  Your physician has recommended you make the following change in your medication:   STOP Metoprolol  START Carvedilol 12.5 mg twice daily. An Rx has been sent to your pharmacy  *If you need a refill on your cardiac medications before your next appointment, please call your pharmacy*   Lab Work: None ordered If you have labs (blood work) drawn today and your tests are completely normal, you will receive your results only by: MyChart Message (if you have MyChart) OR A paper copy in the mail If you have any lab test that is abnormal or we need to change your treatment, we will call you to review the results.   Testing/Procedures: Your physician has requested that you have an echocardiogram. Echocardiography is a painless test that uses sound waves to create images of your heart. It provides your doctor with information about the size and shape of your heart and how well your hearts chambers and valves are working. This procedure takes approximately one hour. There are no restrictions for this procedure.    Follow-Up: At [facility], you and your health needs are our priority.  As part of our continuing mission to provide you with exceptional heart care, we have created designated Provider Care Teams.  These Care Teams include your primary Cardiologist (physician) and Advanced Practice Providers (APPs -  Physician Assistants and Nurse Practitioners) who all work together to provide you with the care you need, when you need it.  We recommend signing up for the patient portal called "MyChart".  Sign up information is provided on this After Visit Summary.  MyChart is used to connect with patients for Virtual Visits (Telemedicine).  Patients are able to view lab/test results, encounter notes, upcoming appointments, etc.  Non-urgent messages can be sent to your provider as well.   To learn more about what you can do with MyChart, go to [email or web address].    Your  next appointment:   4 week(s)  The format for your next appointment:   In Person  Provider:   You may see Lorine Bears, MD or one of the following Advanced Practice Providers on your designated Care Team:   Nicolasa Ducking, NP Eula Listen, PA-C Cadence Fransico Michael, PA-C{    Other Instructions  Echocardiogram An echocardiogram is a test that uses sound waves (ultrasound) to produce images of the heart. Images from an echocardiogram can provide important information about: Heart size and shape. The size and thickness and movement of your heart's walls. Heart muscle function and strength. Heart valve function or if you have stenosis. Stenosis is when the heart valves are too narrow. If blood is flowing backward through the heart valves (regurgitation). A tumor or infectious growth around the heart valves. Areas of heart muscle that are not working well because of poor blood flow or injury from a heart attack. Aneurysm detection. An aneurysm is a weak or damaged part of an artery wall. The wall bulges out from the normal force of blood pumping through the body. Tell a health care provider about: Any allergies you have. All medicines you are taking, including vitamins, herbs, eye drops, creams, and over-the-counter medicines. Any blood disorders you have. Any surgeries you have had. Any medical conditions you have. Whether you are pregnant or may be pregnant. What are the risks? Generally, this is a safe test. However, problems may occur, including an allergic reaction to dye (contrast) that may be used during the test. What happens before the test? No specific preparation  is needed. You may eat and drink normally. What happens during the test?  You will take off your clothes from the waist up and put on a hospital gown. Electrodes or electrocardiogram (ECG)patches may be placed on your chest. The electrodes or patches are then connected to a device that monitors your heart rate and  rhythm. You will lie down on a table for an ultrasound exam. A gel will be applied to your chest to help sound waves pass through your skin. A handheld device, called a transducer, will be pressed against your chest and moved over your heart. The transducer produces sound waves that travel to your heart and bounce back (or "echo" back) to the transducer. These sound waves will be captured in real-time and changed into images of your heart that can be viewed on a video monitor. The images will be recorded on a computer and reviewed by your health care provider. You may be asked to change positions or hold your breath for a short time. This makes it easier to get different views or better views of your heart. In some cases, you may receive an image enhancer through an IV in one of your veins. This can improve the quality of the pictures from your heart. The procedure may vary among health care providers and hospitals. What can I expect after the test? You may return to your normal, everyday life, including diet, activities, and medicines, unless your health care provider tells you not to do that. Follow these instructions at home: It is up to you to get the results of your test. Ask your health care provider, or the department that is doing the test, when your results will be ready. Keep all follow-up visits. This is important. Summary An echocardiogram is a test that uses sound waves (ultrasound) to produce images of the heart. Images from an echocardiogram can provide important information about the size and shape of your heart, heart muscle function, heart valve function, and other possible heart problems. You do not need to do anything to prepare before this test. You may eat and drink normally. After the echocardiogram is completed, you may return to your normal, everyday life, unless your health care provider tells you not to do that. This information is not intended to replace advice given to  you by your health care provider. Make sure you discuss any questions you have with your health care provider. Document Revised: 07/03/2021 Document Reviewed: 06/12/2020 Elsevier Patient Education  2022 [employer].

## 2021-12-04 ENCOUNTER — Encounter: Payer: Self-pay | Admitting: Podiatry

## 2021-12-04 ENCOUNTER — Other Ambulatory Visit: Payer: Self-pay

## 2021-12-04 ENCOUNTER — Ambulatory Visit (INDEPENDENT_AMBULATORY_CARE_PROVIDER_SITE_OTHER): Payer: BC Managed Care – PPO | Admitting: Podiatry

## 2021-12-04 DIAGNOSIS — I872 Venous insufficiency (chronic) (peripheral): Secondary | ICD-10-CM

## 2021-12-04 DIAGNOSIS — M79674 Pain in right toe(s): Secondary | ICD-10-CM

## 2021-12-04 DIAGNOSIS — B351 Tinea unguium: Secondary | ICD-10-CM | POA: Diagnosis not present

## 2021-12-04 DIAGNOSIS — E1165 Type 2 diabetes mellitus with hyperglycemia: Secondary | ICD-10-CM | POA: Diagnosis not present

## 2021-12-04 DIAGNOSIS — R600 Localized edema: Secondary | ICD-10-CM

## 2021-12-04 DIAGNOSIS — R609 Edema, unspecified: Secondary | ICD-10-CM

## 2021-12-04 DIAGNOSIS — N289 Disorder of kidney and ureter, unspecified: Secondary | ICD-10-CM | POA: Diagnosis not present

## 2021-12-04 DIAGNOSIS — M79675 Pain in left toe(s): Secondary | ICD-10-CM

## 2021-12-04 DIAGNOSIS — Z794 Long term (current) use of insulin: Secondary | ICD-10-CM

## 2021-12-04 NOTE — Progress Notes (Signed)
This patient returns to my office for at risk foot care.  This patient requires this care by a professional since this patient will be at risk due to having diabetes and venous stasis.  This patient is unable to cut nails himself since the patient cannot reach his nails.These nails are painful walking and wearing shoes.  This patient presents for at risk foot care today.  General Appearance  Alert, conversant and in no acute stress.  Vascular  Dorsalis pedis and posterior tibial  pulses are not palpable  bilaterally due to severe swelling..  Capillary return is within normal limits  bilaterally. Temperature is within normal limits  bilaterally.  Neurologic  Senn-Weinstein monofilament wire test within normal limits  bilaterally. Muscle power within normal limits bilaterally.  Nails Thick disfigured discolored nails with subungual debris  from hallux to fifth toes bilaterally. No evidence of bacterial infection or drainage bilaterally.  Orthopedic  No limitations of motion  feet .  No crepitus or effusions noted.  No bony pathology or digital deformities noted.  Skin  normotropic skin with no porokeratosis noted bilaterally.  No signs of infections or ulcers noted.     Onychomycosis  Pain in right toes  Pain in left toes  Swelling legs/feet  Consent was obtained for treatment procedures.   Mechanical debridement of nails 1-5  bilaterally performed with a nail nipper.  Filed with dremel without incident.    Return office visit   3 months                   Told patient to return for periodic foot care and evaluation due to potential at risk complications.   Latayvia Mandujano DPM  

## 2021-12-05 ENCOUNTER — Ambulatory Visit (HOSPITAL_COMMUNITY): Payer: BC Managed Care – PPO | Attending: Cardiology

## 2021-12-05 DIAGNOSIS — I5022 Chronic systolic (congestive) heart failure: Secondary | ICD-10-CM | POA: Diagnosis not present

## 2021-12-05 LAB — ECHOCARDIOGRAM COMPLETE
Area-P 1/2: 3.91 cm2
S' Lateral: 3.5 cm

## 2021-12-11 ENCOUNTER — Telehealth: Payer: Self-pay

## 2021-12-11 NOTE — Telephone Encounter (Signed)
Called to give the patient echo results. Lmtcb. 

## 2021-12-11 NOTE — Telephone Encounter (Signed)
-----   Message from Iran Ouch, MD sent at 12/11/2021  8:44 AM EST ----- Inform patient that echo showed normal ejection fraction which is very good news.  He does have moderate left ventricular hypertrophy reflecting longstanding hypertension.  We have to work on controlling his blood pressure.  Continue same medications for now and follow-up as planned.

## 2021-12-13 NOTE — Telephone Encounter (Signed)
Patient calling to discuss recent testing results . Please call after 2:20 today if possible.    Patient aware he can also review results on mychart but he doesn't use it.

## 2021-12-13 NOTE — Telephone Encounter (Signed)
Called the patient after 2:20 pm as requested. Lmtcb.

## 2021-12-25 NOTE — Progress Notes (Addendum)
Cardiology Office Note:    Date:  12/25/2021   ID:  DAYMIAN VESPA, DOB 1966-06-11, MRN [id number]  PCP:  Ardith Dark, MD  [facility] Providers Cardiologist:  Verne Carrow, MD    Referring MD: Ardith Dark, MD   Chief Complaint: 1 month follow-up    Patient Profile: -Chronic Diastolic HF -Nonischemic hypertensive cardiomyopath -HTN -Obstructive sleep apnea ( intolerant of CPAP) -Hyperlipidemia -Diabetes II -Syncope  Prior CV Studies: -(5/17) Positive nuclear stress test with angiographic normal coronaries -LHC completed by Dr. Clifton James, mid LAD lesion 35% stenosed, with moderate LV systolic dysfunction, global hypokinesis with severely elevated LVEDP.  -Echo 2017 EF: 35-40%  -Echo-2022 EF: 60-65%  History of Present Illness:   Luis [name] is a 56 y.o. male with the above problem list.  He was last seen by Dr. Kirke Corin on 1/23   that time his blood pressure was not controlled and metoprolol was switched to carvedilol. He experienced a syncopal episode following prolonged coughing resulting in LOC and shaking. Echo was ordered to evaluate current EF and possible med changes. Echo was completed 2/3 with normal EF 60-65% and moderate left ventricular hypertrophy due to hypertension. He is on torsemide 10 mg and metoprolol was changed to carvedilol 12.5 for better blood pressure control.  Mr. Kaman reports today that he is doing better and denies any more syncopal episodes since December.  his blood pressure today was 140/86 this was down from 152/100. He  denies chest pain, shortness of breath,fatigue, palpitations, diaphoresis, weakness, presyncope, syncope, orthopnea, and PND. He does reports diet indiscretions with bread and takeout food.  His weight is up 5 pounds from last visit and edema was present in lower extremities. He is not exercising at this time  He lives with his sister and she primarily makes meals for him.  He reports that he does not read  ingredients or salt content on food labels.   Past Medical History:  Diagnosis Date   Chronic combined systolic and diastolic CHF (congestive heart failure) (HCC)    Difficult intubation 12/06/2020   DM (diabetes mellitus) (HCC)    Essential hypertension    History of tobacco abuse    HTN (hypertension)    Hyperlipidemia    Mild CAD    Morbid obesity (HCC)    NICM (nonischemic cardiomyopathy) (HCC)    Noncompliance    OSA (obstructive sleep apnea)    Pneumonia 2017   Premature atrial contractions    Sleep apnea    Current Medications: No outpatient medications have been marked as taking for the 12/26/21 encounter (Appointment) with Fransico Michael, Cadence H, PA-C.    Allergies:   Demerol [meperidine] and Morphine and related   Social History   Tobacco Use   Smoking status: Former    Packs/day: 0.50    Years: 20.00    Pack years: 10.00    Types: Cigarettes    Quit date: 03/02/2009    Years since quitting: 12.8   Smokeless tobacco: Never  Vaping Use   Vaping Use: Never used  Substance Use Topics   Alcohol use: No   Drug use: No    Family Hx: The patient's family history includes Diabetes Mellitus II in his mother; Diabetes type II in an other family member; Hypertension in his maternal grandfather. There is no history of Stroke, Cancer, Colon cancer, Colon polyps, Esophageal cancer, Rectal cancer, or Stomach cancer.  Review of Systems  Cardiovascular:  Negative for chest pain, dyspnea on exertion, orthopnea,  palpitations, paroxysmal nocturnal dyspnea and syncope.  Respiratory:  Negative for cough and wheezing.   All other systems reviewed and are negative.   EKGs/Labs/Other Test Reviewed:    EKG: No EKG ordered today  Recent Labs: 10/03/2021: ALT 32; BUN 13; Creatinine, Ser 1.07; Hemoglobin 13.6; Platelets 261.0; Potassium 3.9; Sodium 141; TSH 1.72   Recent Lipid Panel Recent Labs    10/03/21 1444  CHOL 140  TRIG 84.0  HDL 33.80*  VLDL 16.8  LDLCALC 89     Risk  Assessment/Calculations:         Physical Exam:    VS:  There were no vitals taken for this visit.    Wt Readings from Last 3 Encounters:  11/28/21 (!) 390 lb 6 oz (177.1 kg)  10/03/21 (!) 399 lb 12.8 oz (181.3 kg)  05/27/21 (!) 374 lb (169.6 kg)    Vitals reviewed.  Constitutional:      Appearance: Healthy appearance.  Neck:     Vascular: JVD normal.  Pulmonary:     Effort: Pulmonary effort is normal.     Breath sounds: Normal breath sounds.  Cardiovascular:     PMI at left midclavicular line. Normal rate. Regular rhythm. Normal S1. Normal S2.      Murmurs: There is no murmur.     No gallop.  No click. No rub.  Pulses:    Intact distal pulses.  Edema:    Peripheral edema present.    Pretibial: pitting edema of the right pretibial area.    Ankle: 1+ pitting edema of the left ankle and 2+ pitting edema of the right ankle.    Feet: bilateral 1+ edema of the feet. Skin:    General: Skin is warm and dry.        ASSESSMENT & PLAN:    HFimEF/ NICM) -Most recent echo with normal EF 60-65% and moderate left ventricular hypertrophy due to hypertension.  His weight has increased by 5 pounds today with bilateral 1+edema in lower extremities.  -He was instructed to take 2 Demadex for next 3 days and to call and report any increased weight 2 pounds in 1 day or 5 pounds in a week. -Continue potassium 20 mEq once daily -Discussed at length the importance of regular exercise and abstaining from high sodium foods. -We provided information on DASH diet and also exercising 150 minutes/week  Hypertension: -Blood pressure today 140/86 -HTN on coreg 12.5 will increase to 25 mg twice daily.  He will obtain portable blood pressure cuff and check blood pressure twice a day and keep a log of readings. -Discussed at length the importance of regular exercise and abstaining from high sodium foods. -We provided information on DASH diet and also exercising 150 minutes/week  Obstructive sleep  apnea: -Reports intolerance -Advised about the importance of using CPAP nightly  Hyperlipidemia: -Continue Zocor 20 mg -LDL (89 not at goal <70) 10/03/21 -Continue to abstain from red meat and excess sugar  Diabetes II:  -Followed by PCP, continue medication plan -He may benefit from addition of Jardiance or Farxiga in future  Syncope:  -Syncope suspected reports no additional episodes since December.  -No valvular abnormalities reported on Echo      Dispo:  Follow-up in 4 weeks with Dr. Okey Dupre or APP  Medication Adjustments/Labs and Tests Ordered: Current medicines are reviewed at length with the patient today.  Concerns regarding medicines are outlined above.  Tests Ordered: No orders of the defined types were placed in this encounter.  Medication Changes:  No orders of the defined types were placed in this encounter.  Signed, Napoleon Form, Leodis Rains, NP  12/25/2021 5:17 PM    [facility] [address] [place], [place], [place]  [address] Phone: [phone]; Fax: [phone]

## 2021-12-26 ENCOUNTER — Encounter: Payer: Self-pay | Admitting: Medical

## 2021-12-26 ENCOUNTER — Ambulatory Visit (INDEPENDENT_AMBULATORY_CARE_PROVIDER_SITE_OTHER): Payer: BC Managed Care – PPO | Admitting: Nurse Practitioner

## 2021-12-26 ENCOUNTER — Other Ambulatory Visit: Payer: Self-pay

## 2021-12-26 ENCOUNTER — Other Ambulatory Visit: Payer: Self-pay | Admitting: Nurse Practitioner

## 2021-12-26 VITALS — BP 140/86 | HR 83 | Ht 75.0 in | Wt 395.2 lb

## 2021-12-26 DIAGNOSIS — I5022 Chronic systolic (congestive) heart failure: Secondary | ICD-10-CM | POA: Diagnosis not present

## 2021-12-26 DIAGNOSIS — I428 Other cardiomyopathies: Secondary | ICD-10-CM | POA: Diagnosis not present

## 2021-12-26 DIAGNOSIS — I10 Essential (primary) hypertension: Secondary | ICD-10-CM | POA: Diagnosis not present

## 2021-12-26 DIAGNOSIS — R55 Syncope and collapse: Secondary | ICD-10-CM

## 2021-12-26 DIAGNOSIS — E1169 Type 2 diabetes mellitus with other specified complication: Secondary | ICD-10-CM

## 2021-12-26 DIAGNOSIS — E785 Hyperlipidemia, unspecified: Secondary | ICD-10-CM

## 2021-12-26 DIAGNOSIS — E119 Type 2 diabetes mellitus without complications: Secondary | ICD-10-CM

## 2021-12-26 MED ORDER — CARVEDILOL 25 MG PO TABS: 25.0000 mg | ORAL_TABLET | Freq: Two times a day (BID) | ORAL | 3 refills | Status: DC

## 2021-12-26 NOTE — Patient Instructions (Addendum)
Medication Instructions:  Your physician has recommended you make the following change in your medication:   TAKE Torsemide 2 tablets (20 mg) twice a day for 3 days. THEN go back to 1 tablet (10 mg) twice a day INCREASE Carvedilol to 25 mg twice a day  *If you need a refill on your cardiac medications before your next appointment, please call your pharmacy*   Lab Work: None  If you have labs (blood work) drawn today and your tests are completely normal, you will receive your results only by: [place] (if you have MyChart) OR A paper copy in the mail If you have any lab test that is abnormal or we need to change your treatment, we will call you to review the results.   Testing/Procedures: None   Follow-Up: At [facility], you and your health needs are our priority.  As part of our continuing mission to provide you with exceptional heart care, we have created designated Provider Care Teams.  These Care Teams include your primary Cardiologist (physician) and Advanced Practice Providers (APPs -  Physician Assistants and Nurse Practitioners) who all work together to provide you with the care you need, when you need it.   Your next appointment:   1 month(s)  The format for your next appointment:   In Person  Provider:   Lauree Chandler, MD

## 2021-12-27 ENCOUNTER — Other Ambulatory Visit: Payer: Self-pay | Admitting: Family Medicine

## 2021-12-30 ENCOUNTER — Telehealth: Payer: Self-pay | Admitting: Family Medicine

## 2021-12-30 ENCOUNTER — Other Ambulatory Visit: Payer: Self-pay

## 2021-12-30 MED ORDER — TORSEMIDE 10 MG PO TABS: 10.0000 mg | ORAL_TABLET | Freq: Two times a day (BID) | ORAL | 1 refills | Status: DC

## 2021-12-30 NOTE — Telephone Encounter (Signed)
Patient doesn't understand why he haves to call cardiologist for water pill refill whe it was dr [name] who asked him to be on pill.   Patient stated if he doesn't needs this medication then he wont take it.

## 2021-12-30 NOTE — Telephone Encounter (Signed)
Sent in refill to pharmacy per request.

## 2021-12-30 NOTE — Telephone Encounter (Signed)
Please see note; pt advised Cardiology originally prescribed however we asked him to continue to take meds.

## 2021-12-30 NOTE — Telephone Encounter (Signed)
I am fine with [place] or them prescribing it. We just need to make sure it is not being duplicated.   Luis [name]. [name], MD 12/30/2021 11:48 AM

## 2022-01-29 ENCOUNTER — Other Ambulatory Visit: Payer: Self-pay | Admitting: Family Medicine

## 2022-01-29 DIAGNOSIS — E876 Hypokalemia: Secondary | ICD-10-CM

## 2022-01-29 NOTE — Progress Notes (Signed)
? ? ?Office Visit  ?  ?Patient Name: Luis [name] ?Date of Encounter: 01/30/2022 ? ?Primary Care Provider:  Vivi Barrack, MD ?Primary Cardiologist:  Lauree Chandler, MD ?Primary Electrophysiologist: None ?Chief Complaint  ?  ?4-week follow-up for congestive heart failure ? ? Patient Profile: ?-Chronic Diastolic HF ?-NICM ?-HTN ?-Obstructive sleep apnea ( intolerant of CPAP) ?-Hyperlipidemia ?-Diabetes II ?-Syncope ? ? Recent Studies: ?-(03/2016) Positive nuclear stress test with angiographic normal coronaries ?-(03/2016) LHC completed by Dr. Angelena Form, mid LAD lesion 35% stenosed,very tortuous coronaries suggestive of severe HTN ? ?-Echo 2017 EF: 35-40%, diffuse hypokinesis, Grade II DD, ?-Echo 2018: EF: 50-55% mild DD,moderate  LVH, LVE; mild LAE.  ?-Echo 2023: EF 60-65%, mod LVH,Grade I DD,LAE,RVE, and mild dilation of aortic root 38 mm ?  ?History of Present Illness  ?  ?Luis [name] is a 56 y.o. male with PMH of HTN,obesity, DM, sleep apnea, NICM, mild obstructive CAD, and medical noncompliance. Echocardiogram in 2017 showed an EF of 35 to 40%.  Cardiac catheterization showed only mild LAD stenosis Patient was admitted to [facility] 02/2017 with PNA, and flulike symptoms.  He was also found to be volume overloaded and required intubation.  Echo on 03/2017 mild DD,moderate  LVH, LVE; mild LAE.  Echo was completed 2/3 with normal EF 60-65% and moderate left ventricular hypertrophy due to hypertension. He is on torsemide 10 mg and metoprolol was changed to carvedilol 12.5 for better blood pressure control. ? ?He was last seen on 2/23 for follow-up from syncopal episode in December 2022.  During visit patient's weight was up 5 pounds and was very edematous in lower extremities.  He had admitted to indiscretions with sodium and reported that he does not read ingredients of foods.  He was instructed to increase Demadex to 20 mg x 3 days. ? ? ?Since last being seen in our clinic Luis [name] reports doing well and has  not had any repeat signs or symptoms of seizure. His blood pressure today was better controlled at 120/76 with a heart rate of 75. He is trying a new diet with his sister and is motivated to lose weight and start exercising. He is euvolemic on examination today. He use 4 pillows to sleep at night and has difficulty getting rest due to early morning work hours. He does have chronic lower extremity edema without pitting.  He has a history of sleep apnea but has failed to purchase CPAP following test recommendations. He denies chest pain, palpitations, dyspnea, PND,nausea, vomiting, dizziness, syncope, edema, weight gain, or early satiety.   ? ?Past Medical History  ?  ?Past Medical History:  ?Diagnosis Date  ? 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 a. 03/2016 Echo: EF 35-40%, diff HK, GrII DD; b. 03/2017 Echo: EF 50-55%; c. 12/2021 Echo: EF 60-65%, no rwma, GrI DD, mod enlarged RV w/ nl fxn, Ao root 28mm, Asc Ao 52mm.  ? Difficult intubation 12/06/2020  ? DM (diabetes mellitus) ([place])   ? Essential hypertension   ? History of tobacco abuse   ? Hyperlipidemia   ? Morbid obesity ([place])   ? NICM (nonischemic cardiomyopathy) ([place])   ? a. 03/2016 Echo: EF 35-40%, diff HK, GrII DD; b. 03/2017 Echo: EF 50-55%; c. 12/2021 Echo: EF 60-65%, no rwma, GrI DD.  ? Non-obstructive CAD (coronary artery disease)   ? a. 03/2016 Cath: LM nl, LAD 35p, LCX nl, RCA nl. EF 35-45%.  ? Noncompliance   ? OSA (obstructive  sleep apnea)   ? Pneumonia 2017  ? Premature atrial contractions   ? Sleep apnea   ? ?Past Surgical History:  ?Procedure Laterality Date  ? CARDIAC CATHETERIZATION N/A 03/24/2016  ? Procedure: Left Heart Cath and Coronary Angiography;  Surgeon: Leonie Man, MD;  Location: [facility];  Service: Cardiovascular;  Laterality: N/A;  ? FEMUR IM NAIL Right 2012  ? FRACTURE SURGERY    ? ?Allergies ? ?Allergies  ?Allergen Reactions  ? Demerol [Meperidine] Itching  ? Morphine And Related Itching   ? ? ?Home Medications  ?  ?Current Outpatient Medications  ?Medication Sig Dispense Refill  ? aspirin EC 81 MG EC tablet Take 1 tablet (81 mg total) by mouth daily. 30 tablet 0  ? carvedilol (COREG) 25 MG tablet Take 1 tablet (25 mg total) by mouth 2 (two) times daily. 180 tablet 3  ? citalopram (CELEXA) 20 MG tablet TAKE 1 TABLET(20 MG) BY MOUTH DAILY 30 tablet 3  ? glucose blood test strip Check glucose TID/ E11.9 100 each 12  ? ibuprofen (ADVIL) 800 MG tablet Take 1 tablet (800 mg total) by mouth every 8 (eight) hours as needed. 60 tablet 0  ? insulin aspart protamine - aspart (NOVOLOG MIX 70/30 FLEXPEN) (70-30) 100 UNIT/ML FlexPen Inject 0.2 mLs (20 Units total) into the skin 2 (two) times daily. 15 mL 11  ? Insulin Pen Needle 31G X 8 MM MISC Use to inject insulin twice daily 100 each 11  ? Insulin Pen Needle 32G X 6 MM MISC Use to administer insulin two times daily 90 each 11  ? OneTouch Delica Lancets [id number] MISC Use TID/ E11.9 100 each 5  ? oxyCODONE-acetaminophen (PERCOCET) 10-325 MG tablet Take 1 tablet by mouth every 8 (eight) hours as needed for pain. 30 tablet 0  ? potassium chloride SA (KLOR-CON M) 20 MEQ tablet TAKE 1 TABLET BY MOUTH EVERY DAY 30 tablet 2  ? Semaglutide,0.25 or 0.5MG /DOS, (OZEMPIC, 0.25 OR 0.5 MG/DOSE,) 2 MG/1.5ML SOPN Inject 1 mg into the skin once a week. 1.5 mL 3  ? simvastatin (ZOCOR) 20 MG tablet TAKE 1 TABLET(20 MG) BY MOUTH AT BEDTIME 90 tablet 1  ? Sodium Sulfate-Mag Sulfate-KCl (SUTAB) [phone] MG TABS Take 24 tablets by mouth as directed. MANUFACTURER CODES!! BIN: [id number] PCN: CN GROUP: [id number] MEMBER ID: [id number] AS SECONDARY INSURANCE ;NO PRIOR AUTHORIZATION 24 tablet 0  ? spironolactone (ALDACTONE) 25 MG tablet Take 0.5 tablets (12.5 mg total) by mouth daily. 45 tablet 3  ? torsemide (DEMADEX) 10 MG tablet Take 1 tablet (10 mg total) by mouth 2 (two) times daily. 60 tablet 1  ? ?No current facility-administered medications for this visit.  ?  ? ?Review of Systems   ?Please see the history of present illness.    ?(+) Chronic lower extremity swelling ? ?All other systems reviewed and are otherwise negative except as noted above. ? ?Physical Exam  ?  ?Wt Readings from Last 3 Encounters:  ?01/30/22 (!) 393 lb 8 oz (178.5 kg)  ?12/26/21 (!) 395 lb 4 oz (179.3 kg)  ?11/28/21 (!) 390 lb 6 oz (177.1 kg)  ? ?VS: ?Vitals:  ? 01/30/22 0831  ?BP: 120/76  ?Pulse: 75  ?SpO2: 98%  ?,Body mass index is 49.18 kg/m?. ? ?Constitutional:   ?   Appearance: Healthy appearance. Not in distress.  ?Neck:  ?   Vascular: JVD normal.  ?Pulmonary:  ?   Effort: Pulmonary effort is normal.  ?   Breath sounds: No  wheezing. No rales.  ?Cardiovascular:  ?   Normal rate. Regular rhythm. Normal S1. Normal S2.   ?   Murmurs: There is no murmur.  ?Edema: ?   Chronic lower extremity edema without pitting ?Abdominal:  ?   Palpations: Abdomen is soft. There is no hepatomegaly.  ?Skin: ?   General: Skin is warm and dry.  ?Neurological:  ?   General: No focal deficit present.  ?   Mental Status: Alert and oriented to person, place and time.  ?   Cranial Nerves: Cranial nerves are intact.  ?EKG/LABS/Other Studies Reviewed  ?  ?ECG personally reviewed by me today -normal sinus rhythm  with rate of 75- no acute changes. ? ?Risk Assessment/Calculations:   ?Sleep Apnea Evaluation ? ?[place] ? ?Today's Date: 01/30/2022  ? ?Patient Name: Luis [name]        ?DOB: [birth year]  ?     ?Height:  6\' 3"  (1.905 m)     ?Weight: (!) 393 lb 8 oz (178.5 kg)  ?BMI: Body mass index is 49.18 kg/m?.  ?  ?Referring Provider: Dimas Chyle, MD ?  ?STOP-BANG RISK ASSESSMENT    ? ?  01/30/2022  ?  9:27 AM  ?STOP-BANG  ?Do you snore loudly? Yes  ?Do you often feel tired, fatigued, or sleepy during the daytime? Yes  ?Has anyone observed you stop breathing during sleep? Yes  ?Do you have (or are you being treated for) high blood pressure? Yes  ?Recent BMI (Calculated) 49.18  ?Is BMI greater than 35 kg/m2? 1=Yes  ?Age  older than 54 years old? 1=Yes  ?Has large neck size > 40 cm (15.7 in, large male shirt size, large male collar size > 16) Yes  ?Gender - Male 1=Yes  ?STOP-Bang Total Score 8  ?  ?If STOP-BANG Score ?3 OR

## 2022-01-30 ENCOUNTER — Encounter: Payer: Self-pay | Admitting: Nurse Practitioner

## 2022-01-30 ENCOUNTER — Ambulatory Visit (INDEPENDENT_AMBULATORY_CARE_PROVIDER_SITE_OTHER): Payer: BC Managed Care – PPO | Admitting: Nurse Practitioner

## 2022-01-30 VITALS — BP 120/76 | HR 75 | Ht 75.0 in | Wt 393.5 lb

## 2022-01-30 DIAGNOSIS — G4733 Obstructive sleep apnea (adult) (pediatric): Secondary | ICD-10-CM | POA: Diagnosis not present

## 2022-01-30 DIAGNOSIS — E1169 Type 2 diabetes mellitus with other specified complication: Secondary | ICD-10-CM | POA: Diagnosis not present

## 2022-01-30 DIAGNOSIS — E785 Hyperlipidemia, unspecified: Secondary | ICD-10-CM

## 2022-01-30 DIAGNOSIS — Z6841 Body Mass Index (BMI) 40.0 and over, adult: Secondary | ICD-10-CM

## 2022-01-30 DIAGNOSIS — I428 Other cardiomyopathies: Secondary | ICD-10-CM | POA: Diagnosis not present

## 2022-01-30 MED ORDER — SPIRONOLACTONE 25 MG PO TABS: 12.5000 mg | ORAL_TABLET | Freq: Every day | ORAL | 3 refills | Status: DC

## 2022-01-30 NOTE — Patient Instructions (Signed)
Medication Instructions:  ?Your physician has recommended you make the following change in your medication:  ? ?START Spironolactone 12.5 mg once daily.  ? ?*If you need a refill on your cardiac medications before your next appointment, please call your pharmacy* ? ? ?Lab Work: ?BMET in one week.  ? ?If you have labs (blood work) drawn today and your tests are completely normal, you will receive your results only by: ?MyChart Message (if you have MyChart) OR ?A paper copy in the mail ?If you have any lab test that is abnormal or we need to change your treatment, we will call you to review the results. ? ? ?Testing/Procedures: ?None ? ? ?Follow-Up: ?At [facility], you and your health needs are our priority.  As part of our continuing mission to provide you with exceptional heart care, we have created designated Provider Care Teams.  These Care Teams include your primary Cardiologist (physician) and Advanced Practice Providers (APPs -  Physician Assistants and Nurse Practitioners) who all work together to provide you with the care you need, when you need it. ? ? ?Your next appointment:   ?3 month(s) ? ?The format for your next appointment:   ?In Person ? ?Provider:   ?Ambrose Pancoast, NP      ?  :1}  ? ? ?Other Instructions ?Referral has been placed for our [facility]. They will give you a call to set up appointment.  ?

## 2022-02-12 ENCOUNTER — Other Ambulatory Visit: Payer: Self-pay | Admitting: Family Medicine

## 2022-02-18 ENCOUNTER — Encounter: Payer: Self-pay | Admitting: Family Medicine

## 2022-03-05 ENCOUNTER — Ambulatory Visit (INDEPENDENT_AMBULATORY_CARE_PROVIDER_SITE_OTHER): Payer: BC Managed Care – PPO | Admitting: Podiatry

## 2022-03-05 ENCOUNTER — Encounter: Payer: Self-pay | Admitting: Podiatry

## 2022-03-05 DIAGNOSIS — B351 Tinea unguium: Secondary | ICD-10-CM

## 2022-03-05 DIAGNOSIS — M79674 Pain in right toe(s): Secondary | ICD-10-CM

## 2022-03-05 DIAGNOSIS — R609 Edema, unspecified: Secondary | ICD-10-CM | POA: Diagnosis not present

## 2022-03-05 DIAGNOSIS — I872 Venous insufficiency (chronic) (peripheral): Secondary | ICD-10-CM

## 2022-03-05 DIAGNOSIS — E1165 Type 2 diabetes mellitus with hyperglycemia: Secondary | ICD-10-CM | POA: Diagnosis not present

## 2022-03-05 DIAGNOSIS — N289 Disorder of kidney and ureter, unspecified: Secondary | ICD-10-CM | POA: Diagnosis not present

## 2022-03-05 DIAGNOSIS — M79675 Pain in left toe(s): Secondary | ICD-10-CM

## 2022-03-05 DIAGNOSIS — Z794 Long term (current) use of insulin: Secondary | ICD-10-CM

## 2022-03-05 NOTE — Progress Notes (Signed)
This patient returns to my office for at risk foot care.  This patient requires this care by a professional since this patient will be at risk due to having diabetes and venous stasis.  This patient is unable to cut nails himself since the patient cannot reach his nails.These nails are painful walking and wearing shoes.  This patient presents for at risk foot care today.  General Appearance  Alert, conversant and in no acute stress.  Vascular  Dorsalis pedis and posterior tibial  pulses are not palpable  bilaterally due to severe swelling..  Capillary return is within normal limits  bilaterally. Temperature is within normal limits  bilaterally.  Neurologic  Senn-Weinstein monofilament wire test within normal limits  bilaterally. Muscle power within normal limits bilaterally.  Nails Thick disfigured discolored nails with subungual debris  from hallux to fifth toes bilaterally. No evidence of bacterial infection or drainage bilaterally.  Orthopedic  No limitations of motion  feet .  No crepitus or effusions noted.  No bony pathology or digital deformities noted.  Skin  normotropic skin with no porokeratosis noted bilaterally.  No signs of infections or ulcers noted.     Onychomycosis  Pain in right toes  Pain in left toes  Swelling legs/feet  Consent was obtained for treatment procedures.   Mechanical debridement of nails 1-5  bilaterally performed with a nail nipper.  Filed with dremel without incident.    Return office visit   3 months                   Told patient to return for periodic foot care and evaluation due to potential at risk complications.   Danica Camarena DPM  

## 2022-03-06 DIAGNOSIS — H35033 Hypertensive retinopathy, bilateral: Secondary | ICD-10-CM | POA: Diagnosis not present

## 2022-03-06 DIAGNOSIS — E119 Type 2 diabetes mellitus without complications: Secondary | ICD-10-CM | POA: Diagnosis not present

## 2022-03-06 LAB — HM DIABETES EYE EXAM

## 2022-03-11 ENCOUNTER — Encounter: Payer: Self-pay | Admitting: Family Medicine

## 2022-03-19 ENCOUNTER — Other Ambulatory Visit: Payer: Self-pay

## 2022-03-19 ENCOUNTER — Emergency Department (HOSPITAL_BASED_OUTPATIENT_CLINIC_OR_DEPARTMENT_OTHER): Payer: BC Managed Care – PPO | Admitting: Radiology

## 2022-03-19 ENCOUNTER — Emergency Department (HOSPITAL_BASED_OUTPATIENT_CLINIC_OR_DEPARTMENT_OTHER)
Admission: EM | Admit: 2022-03-19 | Discharge: 2022-03-19 | Disposition: A | Discharge status: Home / Self Care | Payer: BC Managed Care – PPO | Attending: Emergency Medicine | Admitting: Emergency Medicine

## 2022-03-19 ENCOUNTER — Encounter (HOSPITAL_BASED_OUTPATIENT_CLINIC_OR_DEPARTMENT_OTHER): Payer: Self-pay

## 2022-03-19 DIAGNOSIS — Z20822 Contact with and (suspected) exposure to covid-19: Secondary | ICD-10-CM | POA: Insufficient documentation

## 2022-03-19 DIAGNOSIS — Z7982 Long term (current) use of aspirin: Secondary | ICD-10-CM | POA: Diagnosis not present

## 2022-03-19 DIAGNOSIS — J9811 Atelectasis: Secondary | ICD-10-CM | POA: Diagnosis not present

## 2022-03-19 DIAGNOSIS — E119 Type 2 diabetes mellitus without complications: Secondary | ICD-10-CM | POA: Diagnosis not present

## 2022-03-19 DIAGNOSIS — I509 Heart failure, unspecified: Secondary | ICD-10-CM | POA: Insufficient documentation

## 2022-03-19 DIAGNOSIS — R0981 Nasal congestion: Secondary | ICD-10-CM | POA: Diagnosis not present

## 2022-03-19 DIAGNOSIS — Z794 Long term (current) use of insulin: Secondary | ICD-10-CM | POA: Diagnosis not present

## 2022-03-19 DIAGNOSIS — J189 Pneumonia, unspecified organism: Secondary | ICD-10-CM | POA: Diagnosis not present

## 2022-03-19 DIAGNOSIS — R0602 Shortness of breath: Secondary | ICD-10-CM | POA: Diagnosis not present

## 2022-03-19 DIAGNOSIS — R0600 Dyspnea, unspecified: Secondary | ICD-10-CM | POA: Diagnosis not present

## 2022-03-19 DIAGNOSIS — R062 Wheezing: Secondary | ICD-10-CM | POA: Diagnosis not present

## 2022-03-19 DIAGNOSIS — J069 Acute upper respiratory infection, unspecified: Secondary | ICD-10-CM | POA: Diagnosis not present

## 2022-03-19 DIAGNOSIS — R059 Cough, unspecified: Secondary | ICD-10-CM | POA: Diagnosis not present

## 2022-03-19 LAB — CBC WITH DIFFERENTIAL/PLATELET
Abs Immature Granulocytes: 0.02 10*3/uL (ref 0.00–0.07)
Basophils Absolute: 0 10*3/uL (ref 0.0–0.1)
Basophils Relative: 1 %
Eosinophils Absolute: 0.3 10*3/uL (ref 0.0–0.5)
Eosinophils Relative: 4 %
HCT: 43.9 % (ref 39.0–52.0)
Hemoglobin: 13.8 g/dL (ref 13.0–17.0)
Immature Granulocytes: 0 %
Lymphocytes Relative: 23 %
Lymphs Abs: 1.9 10*3/uL (ref 0.7–4.0)
MCH: 30.7 pg (ref 26.0–34.0)
MCHC: 31.4 g/dL (ref 30.0–36.0)
MCV: 97.8 fL (ref 80.0–100.0)
Monocytes Absolute: 0.8 10*3/uL (ref 0.1–1.0)
Monocytes Relative: 10 %
Neutro Abs: 5.4 10*3/uL (ref 1.7–7.7)
Neutrophils Relative %: 62 %
Platelets: 224 10*3/uL (ref 150–400)
RBC: 4.49 MIL/uL (ref 4.22–5.81)
RDW: 15.1 % (ref 11.5–15.5)
WBC: 8.5 10*3/uL (ref 4.0–10.5)
nRBC: 0 % (ref 0.0–0.2)

## 2022-03-19 LAB — RESPIRATORY PANEL BY PCR

## 2022-03-19 LAB — BRAIN NATRIURETIC PEPTIDE: B Natriuretic Peptide: 38.9 pg/mL (ref 0.0–100.0)

## 2022-03-19 LAB — BASIC METABOLIC PANEL
Anion gap: 10 (ref 5–15)
BUN: 17 mg/dL (ref 6–20)
CO2: 22 mmol/L (ref 22–32)
Calcium: 8.7 mg/dL — ABNORMAL LOW (ref 8.9–10.3)
Chloride: 106 mmol/L (ref 98–111)
Creatinine, Ser: 1.09 mg/dL (ref 0.61–1.24)
GFR, Estimated: >60 mL/min (ref >60)
Glucose, Bld: 116 mg/dL — ABNORMAL HIGH (ref 70–99)
Potassium: 4.1 mmol/L (ref 3.5–5.1)
Sodium: 138 mmol/L (ref 135–145)

## 2022-03-19 LAB — RESP PANEL BY RT-PCR (FLU A&B, COVID) ARPGX2
Influenza A by PCR: NEGATIVE
Influenza B by PCR: NEGATIVE
SARS Coronavirus 2 by RT PCR: NEGATIVE

## 2022-03-19 MED ORDER — DOXYCYCLINE HYCLATE 100 MG PO CAPS: 100.0000 mg | ORAL_CAPSULE | Freq: Two times a day (BID) | ORAL | 0 refills | Status: AC

## 2022-03-19 NOTE — ED Triage Notes (Signed)
He c/o congested cough since yesterday. He is concerned d/t similar sx 2 years ago turned out to be pneumonia. He is in no distress. ?

## 2022-03-19 NOTE — ED Provider Notes (Addendum)
?[facility] ?Provider Note ? ? ?CSN: [id number] ?Arrival date & time: 03/19/22  1055 ? ?  ? ?History ? ?Chief Complaint  ?Patient presents with  ? URI  ? ? ?Luis [name] is a 56 y.o. male. ? ?Patient with history of chronic heart failure, diabetes, admission for pneumonia, tobacco abuse, obesity high blood pressure presents with congestion, cough and shortness of breath since yesterday.  Feels similar to when he had pneumonia 2 years ago.  Minimal shortness of breath.  No new leg swelling, chronic.  Patient has follow-up outpatient for his heart.  No fevers chills or vomiting. ? ? ?  ? ?Home Medications ?Prior to Admission medications   ?Medication Sig Start Date End Date Taking? Authorizing Provider  ?doxycycline (VIBRAMYCIN) 100 MG capsule Take 1 capsule (100 mg total) by mouth 2 (two) times daily for 7 days. 03/19/22 03/26/22 Yes Blane Ohara, MD  ?aspirin EC 81 MG EC tablet Take 1 tablet (81 mg total) by mouth daily. 03/26/16   Dorothea Ogle, MD  ?carvedilol (COREG) 25 MG tablet Take 1 tablet (25 mg total) by mouth 2 (two) times daily. 12/26/21 03/26/22  Furth, Cadence H, PA-C  ?citalopram (CELEXA) 20 MG tablet TAKE 1 TABLET(20 MG) BY MOUTH DAILY 05/01/21   Ardith Dark, MD  ?glucose blood test strip Check glucose TID/ E11.9 11/08/21   Ardith Dark, MD  ?ibuprofen (ADVIL) 800 MG tablet Take 1 tablet (800 mg total) by mouth every 8 (eight) hours as needed. 05/02/21   Eulis Foster, FNP  ?insulin aspart protamine - aspart (NOVOLOG MIX 70/30 FLEXPEN) (70-30) 100 UNIT/ML FlexPen Inject 0.2 mLs (20 Units total) into the skin 2 (two) times daily. 02/21/21   Ardith Dark, MD  ?Insulin Pen Needle 31G X 8 MM MISC Use to inject insulin twice daily 04/13/19   Ardith Dark, MD  ?Insulin Pen Needle 32G X 6 MM MISC Use to administer insulin two times daily 04/05/18   Ardith Dark, MD  ?Dola Argyle Lancets 33G MISC Use TID/ E11.9 06/06/21   Ardith Dark, MD  ?oxyCODONE-acetaminophen  (PERCOCET) 10-325 MG tablet Take 1 tablet by mouth every 8 (eight) hours as needed for pain. 07/03/21   Ardith Dark, MD  ?potassium chloride SA (KLOR-CON M) 20 MEQ tablet TAKE 1 TABLET BY MOUTH EVERY DAY 01/29/22   Ardith Dark, MD  ?Semaglutide,0.25 or 0.5MG /DOS, (OZEMPIC, 0.25 OR 0.5 MG/DOSE,) 2 MG/1.5ML SOPN Inject 1 mg into the skin once a week. 02/21/21   Ardith Dark, MD  ?simvastatin (ZOCOR) 20 MG tablet TAKE 1 TABLET(20 MG) BY MOUTH AT BEDTIME 11/25/21   Ardith Dark, MD  ?Sodium Sulfate-Mag Sulfate-KCl (SUTAB) [phone] MG TABS Take 24 tablets by mouth as directed. MANUFACTURER CODES!! BIN: [id number] PCN: CN GROUP: [id number] MEMBER ID: [id number];HWE AS SECONDARY INSURANCE ;NO PRIOR AUTHORIZATION 12/13/20   Sherrilyn Rist, MD  ?spironolactone (ALDACTONE) 25 MG tablet Take 0.5 tablets (12.5 mg total) by mouth daily. 01/30/22 04/30/22  Gaston Islam., NP  ?torsemide (DEMADEX) 10 MG tablet TAKE 1 TABLET(10 MG) BY MOUTH TWICE DAILY 02/12/22   Ardith Dark, MD  ?   ? ?Allergies    ?Demerol [meperidine] and Morphine and related   ? ?Review of Systems   ?Review of Systems  ?Constitutional:  Negative for chills and fever.  ?HENT:  Positive for congestion.   ?Eyes:  Negative for visual disturbance.  ?Respiratory:  Positive for cough and  shortness of breath.   ?Cardiovascular:  Negative for chest pain.  ?Gastrointestinal:  Negative for abdominal pain and vomiting.  ?Genitourinary:  Negative for dysuria and flank pain.  ?Musculoskeletal:  Negative for back pain, neck pain and neck stiffness.  ?Skin:  Negative for rash.  ?Neurological:  Negative for light-headedness and headaches.  ? ?Physical Exam ?Updated Vital Signs ?BP 133/78   Pulse 63   Temp 98.3 ?F (36.8 ?C)   Resp (!) 24   SpO2 93%  ?Physical Exam ?Vitals and nursing note reviewed.  ?Constitutional:   ?   General: He is not in acute distress. ?   Appearance: He is well-developed.  ?HENT:  ?   Head: Normocephalic and atraumatic.  ?    Comments: Nasal congestion ?   Mouth/Throat:  ?   Mouth: Mucous membranes are moist.  ?Eyes:  ?   General:     ?   Right eye: No discharge.     ?   Left eye: No discharge.  ?Neck:  ?   Trachea: No tracheal deviation.  ?Cardiovascular:  ?   Rate and Rhythm: Normal rate and regular rhythm.  ?Pulmonary:  ?   Effort: Pulmonary effort is normal.  ?   Breath sounds: Normal breath sounds.  ?Abdominal:  ?   General: There is no distension.  ?   Palpations: Abdomen is soft.  ?   Tenderness: There is no abdominal tenderness. There is no guarding.  ?Musculoskeletal:     ?   General: Swelling (bilateral LE) present.  ?   Cervical back: Normal range of motion and neck supple. No rigidity.  ?Skin: ?   General: Skin is warm.  ?   Capillary Refill: Capillary refill takes less than 2 seconds.  ?   Findings: No rash.  ?Neurological:  ?   General: No focal deficit present.  ?   Mental Status: He is alert.  ?   Cranial Nerves: No cranial nerve deficit.  ?Psychiatric:     ?   Mood and Affect: Mood normal.  ? ? ?ED Results / Procedures / Treatments   ?Labs ?(all labs ordered are listed, but only abnormal results are displayed) ?Labs Reviewed  ?BASIC METABOLIC PANEL - Abnormal; Notable for the following components:  ?    Result Value  ? Glucose, Bld 116 (*)   ? Calcium 8.7 (*)   ? All other components within normal limits  ?RESP PANEL BY RT-PCR (FLU A&B, COVID) [id number]  ?RESPIRATORY PANEL BY PCR  ?BRAIN NATRIURETIC PEPTIDE  ?CBC WITH DIFFERENTIAL/PLATELET  ? ? ?EKG ?EKG Interpretation ? ?Date/Time:  Wednesday Mar 19 2022 11:20:11 EDT ?Ventricular Rate:  68 ?PR Interval:  158 ?QRS Duration: 98 ?QT Interval:  388 ?QTC Calculation: 412 ?R Axis:   50 ?Text Interpretation: Normal sinus rhythm Normal ECG When compared with ECG of 15-Oct-2019 18:59, PREVIOUS ECG IS PRESENT Confirmed by Blane Ohara [phone]) on 03/19/2022 12:03:39 PM ? ?Radiology ?DG Chest 2 View ? ?Result Date: 03/19/2022 ?CLINICAL DATA:  Shortness of breath.  Upper respiratory  infection. EXAM: CHEST - 2 VIEW COMPARISON:  10/15/2019 FINDINGS: Borderline enlargement of the cardiopericardial silhouette without edema. Linear subsegmental atelectasis at the right lung base. Thoracic spondylosis. No consolidation to suggest active pneumonia. No blunting of the costophrenic angles. IMPRESSION: 1. Subsegmental atelectasis at the right lung base. 2. Borderline enlargement of the cardiopericardial silhouette. 3. Thoracic spondylosis. Electronically Signed   By: Gaylyn Rong M.D.   On: 03/19/2022 13:06   ? ?Procedures ?  Procedures  ? ? ?Medications Ordered in ED ?Medications - No data to display ? ?ED Course/ Medical Decision Making/ A&P ?  ?                        ?Medical Decision Making ?Amount and/or Complexity of Data Reviewed ?Labs: ordered. ?Radiology: ordered. ? ?Risk ?Prescription drug management. ? ? ?Patient with heart failure history and metabolic syndrome presents with clinical concern for respiratory infection viral versus bacterial, other differentials include mild heart failure, flulike illness, other.  Plan for blood work, chest x-ray, heart failure screening. ? ?Patient's chest x-ray was reviewed mild cardiomegaly no significant infiltrate but mild atelectasis which could be early infection.  Patient has no fever in the ER, normal oxygenation and normal work of breathing.  Blood work interpreted normal white blood cell count, low BNP, electrolytes unremarkable and no signs significant anemia.  No signs of acute pulmonary edema. ? ?Plan for doxycycline and close outpatient follow-up. ? ? ? ? ? ? ? ?Final Clinical Impression(s) / ED Diagnoses ?Final diagnoses:  ?Acute dyspnea  ?Atypical pneumonia  ? ? ?Rx / DC Orders ?ED Discharge Orders   ? ?      Ordered  ?  doxycycline (VIBRAMYCIN) 100 MG capsule  2 times daily       ? 03/19/22 1506  ? ?  ?  ? ?  ? ? ?  ?Blane Ohara, MD ?03/19/22 1506 ? ?  ?Blane Ohara, MD ?03/19/22 1511 ? ?

## 2022-03-19 NOTE — Discharge Instructions (Signed)
Take antibiotics as prescribed and follow-up closely with your primary doctor for recheck.  Return for shortness of breath, persistent fevers, lethargy or new concerns ?

## 2022-03-19 NOTE — ED Notes (Signed)
Patient verbalizes understanding of discharge instructions. Opportunity for questioning and answers were provided. Patient discharged from ED.  °

## 2022-03-27 ENCOUNTER — Ambulatory Visit: Payer: BC Managed Care – PPO | Admitting: Family Medicine

## 2022-03-27 NOTE — Progress Notes (Unsigned)
   Luis [name] is a 56 y.o. male who presents today for an office visit.  Assessment/Plan:  New/Acute Problems: ***  Chronic Problems Addressed Today: No problem-specific Assessment & Plan notes found for this encounter.     Subjective:  HPI:  Patient here for ED follow up. Went        Objective:  Physical Exam: There were no vitals taken for this visit.  Gen: No acute distress, resting comfortably*** CV: Regular rate and rhythm with no murmurs appreciated Pulm: Normal work of breathing, clear to auscultation bilaterally with no crackles, wheezes, or rhonchi Neuro: Grossly normal, moves all extremities Psych: Normal affect and thought content      Nyair Depaulo M. [name], MD 03/27/2022 11:27 AM

## 2022-04-03 ENCOUNTER — Ambulatory Visit (INDEPENDENT_AMBULATORY_CARE_PROVIDER_SITE_OTHER): Payer: BC Managed Care – PPO | Admitting: Family Medicine

## 2022-04-03 ENCOUNTER — Encounter: Payer: Self-pay | Admitting: Family Medicine

## 2022-04-03 VITALS — BP 152/84 | HR 69 | Temp 97.6°F | Resp 16 | Ht 75.0 in | Wt 391.8 lb

## 2022-04-03 DIAGNOSIS — Z794 Long term (current) use of insulin: Secondary | ICD-10-CM

## 2022-04-03 DIAGNOSIS — I152 Hypertension secondary to endocrine disorders: Secondary | ICD-10-CM

## 2022-04-03 DIAGNOSIS — E1159 Type 2 diabetes mellitus with other circulatory complications: Secondary | ICD-10-CM | POA: Diagnosis not present

## 2022-04-03 DIAGNOSIS — E1165 Type 2 diabetes mellitus with hyperglycemia: Secondary | ICD-10-CM | POA: Diagnosis not present

## 2022-04-03 DIAGNOSIS — R600 Localized edema: Secondary | ICD-10-CM | POA: Diagnosis not present

## 2022-04-03 LAB — POCT GLYCOSYLATED HEMOGLOBIN (HGB A1C): Hemoglobin A1C: 6.8 % — AB (ref 4.0–5.6)

## 2022-04-03 MED ORDER — NOVOLOG MIX 70/30 FLEXPEN (70-30) 100 UNIT/ML ~~LOC~~ SUPN: 22.0000 [IU] | PEN_INJECTOR | Freq: Two times a day (BID) | SUBCUTANEOUS | 11 refills | Status: DC

## 2022-04-03 MED ORDER — EMPAGLIFLOZIN 10 MG PO TABS: 10.0000 mg | ORAL_TABLET | Freq: Every day | ORAL | 3 refills | Status: DC

## 2022-04-03 NOTE — Assessment & Plan Note (Signed)
Follows with cardiology.  He is on torsemide 10 mg twice daily and spironolactone 12.5 mg daily.  We discussed salt avoidance.  We will be starting Jardiance as above which may help.

## 2022-04-03 NOTE — Progress Notes (Signed)
   Luis [name] is a 56 y.o. male who presents today for an office visit.  Assessment/Plan:  New/Acute Problems: Cough Resolved.  Reassuring lung exam today without any signs of infection.  He will let us know if symptoms return.  Chronic Problems Addressed Today: Hypertension associated with diabetes (HCC) Above goal today though he was well controlled at his recent cardiologist office visit and is well controlled at home.  We will continue his current regimen Toprol tartrate 50 mg twice daily and spironolactone 12.5 mg daily.  He will let me know if persistently elevated.  Diabetes (HCC) A1c stable at 6.8 however he is willing to try Jardiance to see if this helps with lower extremity edema.  We also discussed potential cardiac benefits.  We will send in Jardiance 10 mg daily.  We discussed potential side effects.  He will decrease his 70/30 insulin to 20 units twice daily.  We will follow-up in a few weeks via MyChart.  We can recheck A1c in 6 months at his CPE.  Bilateral edema of lower extremity Follows with cardiology.  He is on torsemide 10 mg twice daily and spironolactone 12.5 mg daily.  We discussed salt avoidance.  We will be starting Jardiance as above which may help.     Subjective:  HPI:  Patient here for ED follow up.  He went to the ED 2 weeks ago with cough and congestion.  He was concerned about possible pneumonia.  Had work-up in the ED which was negative for COVID but was positive for rhinovirus.  Chest x-ray was normal.  Rest of his labs were reassuring.  He was started on doxycycline.  Symptoms have improved.  See A/p of status of chronic conditions.        Objective:  Physical Exam: BP (!) 152/84   Pulse 69   Temp 97.6 F (36.4 C) (Temporal)   Resp 16   Ht 6\' 3"  (1.905 m)   Wt (!) 391 lb 12.8 oz (177.7 kg)   SpO2 93%   BMI 48.97 kg/m   Gen: No acute distress, resting comfortably CV: Regular rate and rhythm with no murmurs appreciated Pulm: Normal  work of breathing, clear to auscultation bilaterally with no crackles, wheezes, or rhonchi Neuro: Grossly normal, moves all extremities Psych: Normal affect and thought content  Time Spent: 40 minutes of total time was spent on the date of the encounter performing the following actions: chart review prior to seeing the patient including recent ED visits and visits with specialists, obtaining history, performing a medically necessary exam, counseling on the treatment plan, placing orders, and documenting in our EHR.        . [name], MD 04/03/2022 9:42 AM

## 2022-04-03 NOTE — Assessment & Plan Note (Signed)
A1c stable at 6.8 however he is willing to try Jardiance to see if this helps with lower extremity edema.  We also discussed potential cardiac benefits.  We will send in Jardiance 10 mg daily.  We discussed potential side effects.  He will decrease his 70/30 insulin to 20 units twice daily.  We will follow-up in a few weeks via MyChart.  We can recheck A1c in 6 months at his CPE.

## 2022-04-03 NOTE — Patient Instructions (Signed)
It was very nice to see you today!  We will start Jardiance 10 mg daily.  You can back down on insulin to 20 units twice daily.  Please let me know how this is working for you in a few weeks via MyChart.  No other changes today.  We will see you back in 6 months for your annual physical.  Please come back to see me sooner if needed.  Take care, Dr [name]  PLEASE NOTE:  If you had any lab tests please let us know if you have not heard back within a few days. You may see your results on mychart before we have a chance to review them but we will give you a call once they are reviewed by [place]. If we ordered any referrals today, please let us know if you have not heard from their office within the next week.   Please try these tips to maintain a healthy lifestyle:  Eat at least 3 REAL meals and 1-2 snacks per day.  Aim for no more than 5 hours between eating.  If you eat breakfast, please do so within one hour of getting up.   Each meal should contain half fruits/vegetables, one quarter protein, and one quarter carbs (no bigger than a computer mouse)  Cut down on sweet beverages. This includes juice, soda, and sweet tea.   Drink at least 1 glass of water with each meal and aim for at least 8 glasses per day  Exercise at least 150 minutes every week.

## 2022-04-03 NOTE — Assessment & Plan Note (Signed)
Above goal today though he was well controlled at his recent cardiologist office visit and is well controlled at home.  We will continue his current regimen Toprol tartrate 50 mg twice daily and spironolactone 12.5 mg daily.  He will let me know if persistently elevated.

## 2022-04-04 ENCOUNTER — Telehealth: Payer: Self-pay | Admitting: Family Medicine

## 2022-04-04 NOTE — Telephone Encounter (Signed)
See note

## 2022-04-04 NOTE — Telephone Encounter (Signed)
Pt states prescription for medication listed below is too expensive, $44 (with insurance). Pt states with all of the other type 2 diabetes supplies he cannot afford this.  empagliflozin (JARDIANCE) 10 MG TABS tablet [id number]   Pt requesting to get something for the swollen feet. Pt states it is uncomfortable.   Preferred Pharmacy: [facility] [place], [place] - [address] AT [facility] [address]  [place], [place] [address]  Phone:  [phone]  Fax:  [phone]

## 2022-04-07 ENCOUNTER — Telehealth: Payer: Self-pay | Admitting: Cardiovascular Disease

## 2022-04-07 NOTE — Telephone Encounter (Signed)
He may need to go up on the dose of his diuretics. Recommend he schedule an appointment with cardiology to discuss.  [name]. [name], MD 04/07/2022 7:59 AM

## 2022-04-07 NOTE — Telephone Encounter (Signed)
Pt c/o medication issue:  1. Name of Medication: empagliflozin (JARDIANCE) 10 MG TABS tablet  2. How are you currently taking this medication (dosage and times per day)? Take 1 tablet (10 mg total) by mouth daily before breakfast  3. Are you having a reaction (difficulty breathing--STAT)? no  4. What is your medication issue? Patient states even with insurance he cant afford this medication. He is on a fix income. Please advise

## 2022-04-07 NOTE — Telephone Encounter (Signed)
Patient notified He may need to go up on the dose of his diuretics. Recommend he schedule an appointment with cardiology to discuss Patient stated will call Cardiology today

## 2022-04-07 NOTE — Telephone Encounter (Signed)
Left message to return call to our office at their convenience.  

## 2022-04-07 NOTE — Telephone Encounter (Signed)
Got chat that patient was calling back. He did not answer on return call. Will send mychart message to patient.

## 2022-04-07 NOTE — Telephone Encounter (Signed)
Left detailed message, ( DPR ). Notified patient that PCP [name] was the ordering physician of the Jardiance medication, and that he will need to f/u with PCP [name] regarding his cost concern, or med change.

## 2022-04-07 NOTE — Telephone Encounter (Signed)
Patient called back said that his pcp told him that our office would have to make changes to his medication. Because he still swollen in his feet. Please advise

## 2022-04-08 NOTE — Telephone Encounter (Signed)
Spoke with patient.  His PCP recommeded he call cardiology because he is unable to afford Jardiance and his diuretics may need adjusted.  He has bilat feet swelling which is stable for him.  We discussed that he has Pharmacist, community and so I directed him to the [employer] where he can download the $10 copay card which he states is affordable.  I scheduled him back with NP July 20 as he can only come to appointments on Thursdays.    He will call if needs anything sooner.

## 2022-04-10 ENCOUNTER — Ambulatory Visit: Payer: BC Managed Care – PPO | Admitting: Family Medicine

## 2022-04-11 ENCOUNTER — Other Ambulatory Visit: Payer: Self-pay | Admitting: *Deleted

## 2022-04-11 MED ORDER — TORSEMIDE 10 MG PO TABS: ORAL_TABLET | ORAL | 1 refills | Status: DC

## 2022-04-29 ENCOUNTER — Other Ambulatory Visit: Payer: Self-pay | Admitting: *Deleted

## 2022-04-29 DIAGNOSIS — E876 Hypokalemia: Secondary | ICD-10-CM

## 2022-04-29 MED ORDER — POTASSIUM CHLORIDE CRYS ER 20 MEQ PO TBCR: 20.0000 meq | EXTENDED_RELEASE_TABLET | Freq: Every day | ORAL | 2 refills | Status: DC

## 2022-05-13 ENCOUNTER — Other Ambulatory Visit: Payer: Self-pay | Admitting: *Deleted

## 2022-05-13 DIAGNOSIS — E782 Mixed hyperlipidemia: Secondary | ICD-10-CM

## 2022-05-13 MED ORDER — SIMVASTATIN 20 MG PO TABS: ORAL_TABLET | ORAL | 1 refills | Status: DC

## 2022-05-17 NOTE — Progress Notes (Deleted)
Office Visit    Patient Name: Luis [name] Date of Encounter: 05/17/2022  Primary Care Provider:  Ardith Dark, MD Primary Cardiologist:  Verne Carrow, MD Primary Electrophysiologist: None  Chief Complaint    Luis [name] is a 56 y.o. male with PMH of chronic diastolic CHF, NICM, HTN, OSA (intolerant of CPAP), HLD, DM type II, syncope, tobacco abuse (quit 2016) medical noncompliance who presents today for congestive heart failure and lower extremity edema.  Past Medical History    Past Medical History:  Diagnosis Date   Chronic HFimpEF (heart failure with improved ejection fraction) (HCC)    a. 03/2016 Echo: EF 35-40%, diff HK, GrII DD; b. 03/2017 Echo: EF 50-55%; c. 12/2021 Echo: EF 60-65%, no rwma, GrI DD, mod enlarged RV w/ nl fxn, Ao root 23mm, Asc Ao 35mm.   Difficult intubation 12/06/2020   DM (diabetes mellitus) (HCC)    Essential hypertension    History of tobacco abuse    Hyperlipidemia    Morbid obesity (HCC)    NICM (nonischemic cardiomyopathy) (HCC)    a. 03/2016 Echo: EF 35-40%, diff HK, GrII DD; b. 03/2017 Echo: EF 50-55%; c. 12/2021 Echo: EF 60-65%, no rwma, GrI DD.   Non-obstructive CAD (coronary artery disease)    a. 03/2016 Cath: LM nl, LAD 35p, LCX nl, RCA nl. EF 35-45%.   Noncompliance    OSA (obstructive sleep apnea)    Pneumonia 2017   Premature atrial contractions    Sleep apnea    Past Surgical History:  Procedure Laterality Date   CARDIAC CATHETERIZATION N/A 03/24/2016   Procedure: Left Heart Cath and Coronary Angiography;  Surgeon: Marykay Lex, MD;  Location: [facility] INVASIVE CV LAB;  Service: Cardiovascular;  Laterality: N/A;   FEMUR IM NAIL Right 2012   FRACTURE SURGERY      Allergies  Allergies  Allergen Reactions   Demerol [Meperidine] Itching   Morphine And Related Itching    History of Present Illness    Luis [name] is a 56 year old male with the above-mentioned past medical history who presents today for  follow-up of congestive heart failure.  Patient was previously seen in the ED on 10/2015 for complaint of dyspnea with cough and was diagnosed with community-acquired pneumonia following chest CT that confirmed multifocal pneumonia.  He was admitted and placed on IV antibiotics.  His troponins were mildly elevated and cardiology was consulted which felt that it was most likely related to demand ischemia in the setting of pneumonia.  2D echo was completed on 01/2016 that showed EF of 35-40% with diffuse hypokinesis and grade 2 DD.  Lexiscan Myoview was abnormal and left heart cath was completed 03/24/2016 with mild LAD stenosis.  He underwent MRI of the brain due to complaint of blurred vision following cardiac cath that was negative for stroke.  Neurology recommended follow-up with ophthalmology.  He was readmitted to [facility] on 02/2017 with pneumonia and volume overload requiring intubation.  He was found to not be taking any of his medications and had not followed up with cardiology.  He was seen by Dr. Clifton James on 03/26/2017 after hospitalization and his ARB was stopped due to renal insufficiency and weight was stable with Lasix 40 mg daily.  He was referred to pulmonology due to dyspnea and was prescribed a rescue inhaler.  He was lost to follow-up until 11/2021 when he was referred to Dr. Kirke Corin for complaint of syncope.  Patient reported witnessed syncopal episode after frequent coughing spell  with frequent shaking suggestive of a seizure.  This was found to be likely vasovagal in nature with no additional work-up at that time.  2D echo was ordered with EF 60-65% and moderate left ventricular hypertrophy due to hypertension.  His metoprolol was switched to carvedilol with plan to initiate Entresto if EF was reduced.  He was seen in follow-up 1 month later with increased blood pressure and volume overload.  His Demadex was increased for 3 days and his Coreg was increased to 25 mg twice daily.  I last saw Luis [name]  last on 01/2022 and his blood pressure was under better control.  His GDMT was optimized with spironolactone 12.5 mg and a SGLT2 inhibitor.      Since last being seen in the office patient reports***.  Patient denies chest pain, palpitations, dyspnea, PND, orthopnea, nausea, vomiting, dizziness, syncope, edema, weight gain, or early satiety.     ***Notes:  Home Medications    Current Outpatient Medications  Medication Sig Dispense Refill   aspirin EC 81 MG EC tablet Take 1 tablet (81 mg total) by mouth daily. 30 tablet 0   carvedilol (COREG) 25 MG tablet Take 1 tablet (25 mg total) by mouth 2 (two) times daily. 180 tablet 3   empagliflozin (JARDIANCE) 10 MG TABS tablet Take 1 tablet (10 mg total) by mouth daily before breakfast. 90 tablet 3   glucose blood test strip Check glucose TID/ E11.9 100 each 12   ibuprofen (ADVIL) 800 MG tablet Take 1 tablet (800 mg total) by mouth every 8 (eight) hours as needed. 60 tablet 0   insulin aspart protamine - aspart (NOVOLOG MIX 70/30 FLEXPEN) (70-30) 100 UNIT/ML FlexPen Inject 22 Units into the skin 2 (two) times daily. 15 mL 11   Insulin Pen Needle 31G X 8 MM MISC Use to inject insulin twice daily 100 each 11   Insulin Pen Needle 32G X 6 MM MISC Use to administer insulin two times daily 90 each 11   OneTouch Delica Lancets 33G MISC Use TID/ E11.9 100 each 5   potassium chloride SA (KLOR-CON M) 20 MEQ tablet Take 1 tablet (20 mEq total) by mouth daily. 30 tablet 2   simvastatin (ZOCOR) 20 MG tablet TAKE 1 TABLET(20 MG) BY MOUTH AT BEDTIME 90 tablet 1   spironolactone (ALDACTONE) 25 MG tablet Take 0.5 tablets (12.5 mg total) by mouth daily. 45 tablet 3   torsemide (DEMADEX) 10 MG tablet TAKE 1 TABLET(10 MG) BY MOUTH TWICE DAILY 60 tablet 1   No current facility-administered medications for this visit.     Review of Systems  Please see the history of present illness.    (+)*** (+)***  All other systems reviewed and are otherwise negative  except as noted above.  Physical Exam    Wt Readings from Last 3 Encounters:  04/03/22 (!) 391 lb 12.8 oz (177.7 kg)  01/30/22 (!) 393 lb 8 oz (178.5 kg)  12/26/21 (!) 395 lb 4 oz (179.3 kg)   GX:QJJHE were no vitals filed for this visit.,There is no height or weight on file to calculate BMI.  Constitutional:      Appearance: Healthy appearance. Not in distress.  Neck:     Vascular: JVD normal.  Pulmonary:     Effort: Pulmonary effort is normal.     Breath sounds: No wheezing. No rales. Diminished in the bases Cardiovascular:     Normal rate. Regular rhythm. Normal S1. Normal S2.      Murmurs: There  is no murmur.  Edema:    Peripheral edema absent.  Abdominal:     Palpations: Abdomen is soft non tender. There is no hepatomegaly.  Skin:    General: Skin is warm and dry.  Neurological:     General: No focal deficit present.     Mental Status: Alert and oriented to person, place and time.     Cranial Nerves: Cranial nerves are intact.  EKG/LABS/Other Studies Reviewed    ECG personally reviewed by me today - ***  Risk Assessment/Calculations:   {Does this patient have ATRIAL FIBRILLATION?:[id number]}  STOP-Bang Score:  8  { Consider Dx Sleep Disordered Breathing or Sleep Apnea  ICD G47.33          :1}      Lab Results  Component Value Date   WBC 8.5 03/19/2022   HGB 13.8 03/19/2022   HCT 43.9 03/19/2022   MCV 97.8 03/19/2022   PLT 224 03/19/2022   Lab Results  Component Value Date   CREATININE 1.09 03/19/2022   BUN 17 03/19/2022   NA 138 03/19/2022   K 4.1 03/19/2022   CL 106 03/19/2022   CO2 22 03/19/2022   Lab Results  Component Value Date   ALT 32 10/03/2021   AST 22 10/03/2021   ALKPHOS 155 (H) 10/03/2021   BILITOT 0.5 10/03/2021   Lab Results  Component Value Date   CHOL 140 10/03/2021   HDL 33.80 (L) 10/03/2021   LDLCALC 89 10/03/2021   TRIG 84.0 10/03/2021   CHOLHDL 4 10/03/2021    Lab Results  Component Value Date   HGBA1C 6.8 (A)  04/03/2022    Assessment & Plan    1.HFimEF/NICM  2.  HTN  3.  HLD  4.  Obstructive sleep apnea  5.  Medication noncompliance      Disposition: Follow-up with Lauree Chandler, MD or APP in *** months {Are you ordering a CV Procedure (e.g. stress test, cath, DCCV, TEE, etc)?   Press F2        :[id number]   Medication Adjustments/Labs and Tests Ordered: Current medicines are reviewed at length with the patient today.  Concerns regarding medicines are outlined above.   Signed, Mable Fill, Marissa Nestle, NP 05/17/2022, 3:20 PM [place]

## 2022-05-22 ENCOUNTER — Ambulatory Visit: Payer: BC Managed Care – PPO | Admitting: Nurse Practitioner

## 2022-06-06 ENCOUNTER — Encounter: Payer: Self-pay | Admitting: Podiatry

## 2022-06-06 ENCOUNTER — Ambulatory Visit (INDEPENDENT_AMBULATORY_CARE_PROVIDER_SITE_OTHER): Payer: BC Managed Care – PPO | Admitting: Podiatry

## 2022-06-06 DIAGNOSIS — M79674 Pain in right toe(s): Secondary | ICD-10-CM | POA: Diagnosis not present

## 2022-06-06 DIAGNOSIS — B351 Tinea unguium: Secondary | ICD-10-CM | POA: Diagnosis not present

## 2022-06-06 DIAGNOSIS — Z794 Long term (current) use of insulin: Secondary | ICD-10-CM

## 2022-06-06 DIAGNOSIS — M79675 Pain in left toe(s): Secondary | ICD-10-CM

## 2022-06-06 DIAGNOSIS — E1165 Type 2 diabetes mellitus with hyperglycemia: Secondary | ICD-10-CM

## 2022-06-06 DIAGNOSIS — N289 Disorder of kidney and ureter, unspecified: Secondary | ICD-10-CM

## 2022-06-06 DIAGNOSIS — R609 Edema, unspecified: Secondary | ICD-10-CM

## 2022-06-06 DIAGNOSIS — R600 Localized edema: Secondary | ICD-10-CM

## 2022-06-06 NOTE — Progress Notes (Signed)
This patient returns to my office for at risk foot care.  This patient requires this care by a professional since this patient will be at risk due to having diabetes and venous stasis.  This patient is unable to cut nails himself since the patient cannot reach his nails.These nails are painful walking and wearing shoes.  This patient presents for at risk foot care today.  General Appearance  Alert, conversant and in no acute stress.  Vascular  Dorsalis pedis and posterior tibial  pulses are not palpable  bilaterally due to severe swelling..  Capillary return is within normal limits  bilaterally. Temperature is within normal limits  bilaterally.  Neurologic  Senn-Weinstein monofilament wire test within normal limits  bilaterally. Muscle power within normal limits bilaterally.  Nails Thick disfigured discolored nails with subungual debris  from hallux to fifth toes bilaterally. No evidence of bacterial infection or drainage bilaterally.  Orthopedic  No limitations of motion  feet .  No crepitus or effusions noted.  No bony pathology or digital deformities noted.  Skin  normotropic skin with no porokeratosis noted bilaterally.  No signs of infections or ulcers noted.     Onychomycosis  Pain in right toes  Pain in left toes  Swelling legs/feet  Consent was obtained for treatment procedures.   Mechanical debridement of nails 1-5  bilaterally performed with a nail nipper.  Filed with dremel without incident.    Return office visit   3 months                   Told patient to return for periodic foot care and evaluation due to potential at risk complications.   Jacori Mulrooney DPM  

## 2022-06-09 ENCOUNTER — Telehealth: Payer: Self-pay | Admitting: Family Medicine

## 2022-06-09 ENCOUNTER — Other Ambulatory Visit: Payer: Self-pay | Admitting: *Deleted

## 2022-06-09 MED ORDER — TORSEMIDE 10 MG PO TABS: ORAL_TABLET | ORAL | 1 refills | Status: DC

## 2022-06-09 NOTE — Telephone Encounter (Signed)
  LAST APPOINTMENT DATE:  Please schedule appointment if longer than 1 year 04/03/22  NEXT APPOINTMENT DATE: 10/09/22  MEDICATION:   torsemide (DEMADEX) 10 MG tablet  Is the patient out of medication? Yes-took last only one this morning  PHARMACY: [facility] #[id number] - [place], [place] - [address] AT [facility] OF [address] Phone:  [phone]  Fax:  [phone]      Let patient know to contact pharmacy at the end of the day to make sure medication is ready.  Please notify patient to allow 48-72 hours to process

## 2022-06-10 DIAGNOSIS — R55 Syncope and collapse: Secondary | ICD-10-CM | POA: Diagnosis not present

## 2022-06-10 DIAGNOSIS — R000 Tachycardia, unspecified: Secondary | ICD-10-CM | POA: Diagnosis not present

## 2022-06-11 ENCOUNTER — Emergency Department (HOSPITAL_COMMUNITY): Payer: BC Managed Care – PPO

## 2022-06-11 ENCOUNTER — Encounter (HOSPITAL_COMMUNITY): Payer: Self-pay

## 2022-06-11 ENCOUNTER — Other Ambulatory Visit: Payer: Self-pay

## 2022-06-11 ENCOUNTER — Emergency Department (HOSPITAL_COMMUNITY)
Admission: EM | Admit: 2022-06-11 | Discharge: 2022-06-11 | Disposition: A | Discharge status: Home / Self Care | Payer: BC Managed Care – PPO | Attending: Emergency Medicine | Admitting: Emergency Medicine

## 2022-06-11 DIAGNOSIS — Y92410 Unspecified street and highway as the place of occurrence of the external cause: Secondary | ICD-10-CM | POA: Insufficient documentation

## 2022-06-11 DIAGNOSIS — Z7982 Long term (current) use of aspirin: Secondary | ICD-10-CM | POA: Diagnosis not present

## 2022-06-11 DIAGNOSIS — R0689 Other abnormalities of breathing: Secondary | ICD-10-CM | POA: Diagnosis not present

## 2022-06-11 DIAGNOSIS — F10129 Alcohol abuse with intoxication, unspecified: Secondary | ICD-10-CM | POA: Diagnosis not present

## 2022-06-11 DIAGNOSIS — Y905 Blood alcohol level of 100-119 mg/100 ml: Secondary | ICD-10-CM | POA: Diagnosis not present

## 2022-06-11 DIAGNOSIS — M546 Pain in thoracic spine: Secondary | ICD-10-CM

## 2022-06-11 DIAGNOSIS — I5032 Chronic diastolic (congestive) heart failure: Secondary | ICD-10-CM | POA: Diagnosis not present

## 2022-06-11 DIAGNOSIS — R930 Abnormal findings on diagnostic imaging of skull and head, not elsewhere classified: Secondary | ICD-10-CM | POA: Insufficient documentation

## 2022-06-11 DIAGNOSIS — I959 Hypotension, unspecified: Secondary | ICD-10-CM | POA: Diagnosis not present

## 2022-06-11 DIAGNOSIS — M47812 Spondylosis without myelopathy or radiculopathy, cervical region: Secondary | ICD-10-CM | POA: Diagnosis not present

## 2022-06-11 DIAGNOSIS — J3489 Other specified disorders of nose and nasal sinuses: Secondary | ICD-10-CM | POA: Diagnosis not present

## 2022-06-11 DIAGNOSIS — Z87891 Personal history of nicotine dependence: Secondary | ICD-10-CM | POA: Insufficient documentation

## 2022-06-11 DIAGNOSIS — S20219A Contusion of unspecified front wall of thorax, initial encounter: Secondary | ICD-10-CM | POA: Insufficient documentation

## 2022-06-11 DIAGNOSIS — R600 Localized edema: Secondary | ICD-10-CM | POA: Insufficient documentation

## 2022-06-11 DIAGNOSIS — E119 Type 2 diabetes mellitus without complications: Secondary | ICD-10-CM | POA: Diagnosis not present

## 2022-06-11 DIAGNOSIS — I110 Hypertensive heart disease with heart failure: Secondary | ICD-10-CM | POA: Insufficient documentation

## 2022-06-11 DIAGNOSIS — F10920 Alcohol use, unspecified with intoxication, uncomplicated: Secondary | ICD-10-CM

## 2022-06-11 DIAGNOSIS — I2510 Atherosclerotic heart disease of native coronary artery without angina pectoris: Secondary | ICD-10-CM | POA: Insufficient documentation

## 2022-06-11 DIAGNOSIS — Z794 Long term (current) use of insulin: Secondary | ICD-10-CM | POA: Insufficient documentation

## 2022-06-11 DIAGNOSIS — Z041 Encounter for examination and observation following transport accident: Secondary | ICD-10-CM | POA: Diagnosis not present

## 2022-06-11 DIAGNOSIS — S299XXA Unspecified injury of thorax, initial encounter: Secondary | ICD-10-CM | POA: Diagnosis not present

## 2022-06-11 LAB — COMPREHENSIVE METABOLIC PANEL
ALT: 16 U/L (ref 0–44)
AST: 26 U/L (ref 15–41)
Albumin: 3.3 g/dL — ABNORMAL LOW (ref 3.5–5.0)
Alkaline Phosphatase: 116 U/L (ref 38–126)
Anion gap: 13 (ref 5–15)
BUN: 19 mg/dL (ref 6–20)
CO2: 17 mmol/L — ABNORMAL LOW (ref 22–32)
Calcium: 7.9 mg/dL — ABNORMAL LOW (ref 8.9–10.3)
Chloride: 110 mmol/L (ref 98–111)
Creatinine, Ser: 1.42 mg/dL — ABNORMAL HIGH (ref 0.61–1.24)
GFR, Estimated: 59 mL/min — ABNORMAL LOW (ref >60)
Glucose, Bld: 114 mg/dL — ABNORMAL HIGH (ref 70–99)
Potassium: 4.2 mmol/L (ref 3.5–5.1)
Sodium: 140 mmol/L (ref 135–145)
Total Bilirubin: 0.9 mg/dL (ref 0.3–1.2)
Total Protein: 6.7 g/dL (ref 6.5–8.1)

## 2022-06-11 LAB — CBC
HCT: 47.7 % (ref 39.0–52.0)
Hemoglobin: 14.8 g/dL (ref 13.0–17.0)
MCH: 31.8 pg (ref 26.0–34.0)
MCHC: 31 g/dL (ref 30.0–36.0)
MCV: 102.6 fL — ABNORMAL HIGH (ref 80.0–100.0)
Platelets: 61 10*3/uL — ABNORMAL LOW (ref 150–400)
RBC: 4.65 MIL/uL (ref 4.22–5.81)
RDW: 15.1 % (ref 11.5–15.5)
WBC: 9.1 10*3/uL (ref 4.0–10.5)
nRBC: 0 % (ref 0.0–0.2)

## 2022-06-11 LAB — TROPONIN I (HIGH SENSITIVITY)
Troponin I (High Sensitivity): 13 ng/L (ref <18)
Troponin I (High Sensitivity): 8 ng/L (ref <18)

## 2022-06-11 LAB — ETHANOL: Alcohol, Ethyl (B): 111 mg/dL — ABNORMAL HIGH (ref <10)

## 2022-06-11 MED ORDER — FENTANYL CITRATE PF 50 MCG/ML IJ SOSY
50.0000 ug | PREFILLED_SYRINGE | Freq: Once | INTRAMUSCULAR | Status: AC
  Administered 2022-06-11: 50 ug via INTRAVENOUS
  Filled 2022-06-11: qty 1

## 2022-06-11 MED ORDER — IOHEXOL 300 MG/ML  SOLN
100.0000 mL | Freq: Once | INTRAMUSCULAR | Status: AC | PRN
  Administered 2022-06-11: 100 mL via INTRAVENOUS

## 2022-06-11 MED ORDER — OXYCODONE HCL 5 MG PO TABS: 5.0000 mg | ORAL_TABLET | ORAL | 0 refills | Status: DC | PRN

## 2022-06-11 MED ORDER — SODIUM CHLORIDE 0.9 % IV SOLN
1000.0000 mL | INTRAVENOUS | Status: DC
  Administered 2022-06-11: 1000 mL via INTRAVENOUS

## 2022-06-11 MED ORDER — SODIUM CHLORIDE 0.9 % IV BOLUS (SEPSIS)
1000.0000 mL | Freq: Once | INTRAVENOUS | Status: AC
  Administered 2022-06-11: 1000 mL via INTRAVENOUS

## 2022-06-11 MED ORDER — KETAMINE HCL 50 MG/5ML IJ SOSY
0.3000 mg/kg | PREFILLED_SYRINGE | Freq: Once | INTRAMUSCULAR | Status: AC
  Administered 2022-06-11: 53 mg via INTRAVENOUS
  Filled 2022-06-11: qty 10

## 2022-06-11 MED ORDER — ACETAMINOPHEN 500 MG PO TABS
1000.0000 mg | ORAL_TABLET | Freq: Once | ORAL | Status: AC
  Administered 2022-06-11: 1000 mg via ORAL
  Filled 2022-06-11: qty 2

## 2022-06-11 MED ORDER — CYCLOBENZAPRINE HCL 10 MG PO TABS: 10.0000 mg | ORAL_TABLET | Freq: Two times a day (BID) | ORAL | 0 refills | Status: DC | PRN

## 2022-06-11 MED ORDER — CYCLOBENZAPRINE HCL 10 MG PO TABS
10.0000 mg | ORAL_TABLET | Freq: Once | ORAL | Status: AC
  Administered 2022-06-11: 10 mg via ORAL
  Filled 2022-06-11: qty 1
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
## 2022-06-11 NOTE — ED Notes (Signed)
Pt reports feeling better following medication. Pt assisted to stand and ambulation to rest room. PT c/o pain with movement from sitting to standing. Pt ambulated without difficulty.  Provider aware.
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
## 2022-06-11 NOTE — ED Notes (Signed)
Pt and pt mother at bedside provided written and verbal d/c instructions.Pt verbalizes understanding. Pt mother reports sister driving home. Pt denies questions. Pt left via wheelchair.

## 2022-06-11 NOTE — Discharge Instructions (Addendum)
For pain control you may take at 1000 mg of Tylenol every 6-8 hours as needed. For your pain, you may take up to 1000mg  of acetaminophen (tylenol) 4 times daily for up to a week. This is the maximum dose of acetminophen (tylenol) you can take from all sources. Please check other over-the-counter medications and prescriptions to ensure you are not taking other medications that contain acetaminophen.  I have given you a prescription for flexeril. Please take this for muscle spasm as well as using a lidocaine patch you may get over the counter. Take oxycodone as needed for breakthrough pain.  This medication can be addicting, sedating and cause constipation.

## 2022-06-11 NOTE — ED Notes (Signed)
Pt difficult stick for blood draw, multiple staff members attempted. 3rd RN trying at this moment.

## 2022-06-17 ENCOUNTER — Encounter: Payer: Self-pay | Admitting: Family Medicine

## 2022-06-17 ENCOUNTER — Encounter: Payer: Self-pay | Admitting: Neurology

## 2022-06-17 ENCOUNTER — Ambulatory Visit (INDEPENDENT_AMBULATORY_CARE_PROVIDER_SITE_OTHER): Payer: BC Managed Care – PPO | Admitting: Family Medicine

## 2022-06-17 VITALS — BP 110/68 | HR 79 | Temp 97.9°F | Ht 75.0 in | Wt 389.4 lb

## 2022-06-17 DIAGNOSIS — I480 Paroxysmal atrial fibrillation: Secondary | ICD-10-CM

## 2022-06-17 DIAGNOSIS — E1165 Type 2 diabetes mellitus with hyperglycemia: Secondary | ICD-10-CM | POA: Diagnosis not present

## 2022-06-17 DIAGNOSIS — Z794 Long term (current) use of insulin: Secondary | ICD-10-CM

## 2022-06-17 DIAGNOSIS — I5032 Chronic diastolic (congestive) heart failure: Secondary | ICD-10-CM | POA: Diagnosis not present

## 2022-06-17 DIAGNOSIS — R059 Cough, unspecified: Secondary | ICD-10-CM

## 2022-06-17 DIAGNOSIS — R55 Syncope and collapse: Secondary | ICD-10-CM

## 2022-06-17 MED ORDER — OXYCODONE-ACETAMINOPHEN 10-325 MG PO TABS: 1.0000 | ORAL_TABLET | Freq: Three times a day (TID) | ORAL | 0 refills | Status: AC | PRN

## 2022-06-17 MED ORDER — IBUPROFEN 800 MG PO TABS: 800.0000 mg | ORAL_TABLET | Freq: Three times a day (TID) | ORAL | 0 refills | Status: DC | PRN

## 2022-06-17 NOTE — Patient Instructions (Signed)
It was very nice to see you today!  We will refer you to see a pulmonologist and neurologist.  Please call to schedule appointment with the cardiologist soon.  I will refill your pain meds.  Let me know if this does not continue to improve over the next 1 to 2 weeks.  Please see about getting back on the Ozempic.  I will see back in a couple of months.  Please come back to see [place] sooner if needed.  Take care, Dr [name]  PLEASE NOTE:  If you had any lab tests please let us know if you have not heard back within a few days. You may see your results on mychart before we have a chance to review them but we will give you a call once they are reviewed by [place]. If we ordered any referrals today, please let us know if you have not heard from their office within the next week.   Please try these tips to maintain a healthy lifestyle:  Eat at least 3 REAL meals and 1-2 snacks per day.  Aim for no more than 5 hours between eating.  If you eat breakfast, please do so within one hour of getting up.   Each meal should contain half fruits/vegetables, one quarter protein, and one quarter carbs (no bigger than a computer mouse)  Cut down on sweet beverages. This includes juice, soda, and sweet tea.   Drink at least 1 glass of water with each meal and aim for at least 8 glasses per day  Exercise at least 150 minutes every week.

## 2022-06-17 NOTE — Assessment & Plan Note (Signed)
No signs of volume overload.  Advised to follow-up with cardiology ASAP.

## 2022-06-17 NOTE — Assessment & Plan Note (Signed)
Regular rate and rhythm today.  We will defer further management to cardiology.
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
## 2022-06-17 NOTE — Assessment & Plan Note (Addendum)
BMI 48 today.  We will refer to pain management clinic per patient request.  Hopefully will be able to get him back on a GLP-1 agonist which will help.

## 2022-06-17 NOTE — Assessment & Plan Note (Signed)
Last A1c is at goal.  Too early to recheck today.  He is currently on Jardiance 10 mg daily and insulin 70/30 20 units twice daily.  We had talked about getting him back on a GLP-1 agonist however he has some financial issues with this.  He will try to get the sorted out.  Hopefully will be able to get him on a GLP-1 such as Ozempic and being on insulin to help with weight loss.  He will come back soon to recheck A1c.

## 2022-06-17 NOTE — Assessment & Plan Note (Signed)
Possibly related to underlying COPD.  Family is concerned that this is causing his cough.  We will refer to pulmonology for further evaluation.

## 2022-06-19 ENCOUNTER — Encounter: Payer: Self-pay | Admitting: Family Medicine

## 2022-06-23 NOTE — Telephone Encounter (Signed)
I appreciate the update. I think him seeing a PA is fine. I have not received anything from [employer].  [name]. [name], MD 06/23/2022 3:18 PM

## 2022-06-23 NOTE — Telephone Encounter (Signed)
See note

## 2022-06-24 ENCOUNTER — Encounter: Payer: Self-pay | Admitting: Cardiology

## 2022-06-24 ENCOUNTER — Ambulatory Visit (INDEPENDENT_AMBULATORY_CARE_PROVIDER_SITE_OTHER): Payer: BC Managed Care – PPO

## 2022-06-24 ENCOUNTER — Telehealth: Payer: Self-pay | Admitting: Family Medicine

## 2022-06-24 ENCOUNTER — Ambulatory Visit (INDEPENDENT_AMBULATORY_CARE_PROVIDER_SITE_OTHER): Payer: BC Managed Care – PPO | Admitting: Cardiology

## 2022-06-24 VITALS — BP 120/84 | HR 78 | Ht 75.0 in | Wt 388.0 lb

## 2022-06-24 DIAGNOSIS — R55 Syncope and collapse: Secondary | ICD-10-CM

## 2022-06-24 DIAGNOSIS — I5022 Chronic systolic (congestive) heart failure: Secondary | ICD-10-CM

## 2022-06-24 DIAGNOSIS — I10 Essential (primary) hypertension: Secondary | ICD-10-CM

## 2022-06-24 DIAGNOSIS — G4733 Obstructive sleep apnea (adult) (pediatric): Secondary | ICD-10-CM | POA: Diagnosis not present

## 2022-06-24 DIAGNOSIS — I428 Other cardiomyopathies: Secondary | ICD-10-CM

## 2022-06-24 DIAGNOSIS — Z720 Tobacco use: Secondary | ICD-10-CM

## 2022-06-24 MED ORDER — SPIRONOLACTONE 25 MG PO TABS: 25.0000 mg | ORAL_TABLET | Freq: Every day | ORAL | 1 refills | Status: DC

## 2022-06-24 NOTE — Telephone Encounter (Signed)
Patient states: -He was informed by pharmacy that approval for medication below is needed from PCP prior to refill   LAST APPOINTMENT DATE: 06/17/22  NEXT APPOINTMENT DATE:  08/21/22  MEDICATION: glucose blood test strip [[id number]]   Is the patient out of medication? Yes  PHARMACY: [id number] [place], [place] - [address] AT [facility] [address]  [address] [place], [place] [address]  Phone:  [phone]  Fax:  [phone]  DEA #:  [id number]  Let patient know to contact pharmacy at the end of the day to make sure medication is ready.  Please notify patient to allow 48-72 hours to process
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
## 2022-06-24 NOTE — Patient Instructions (Addendum)
Medication Instructions:   Your physician has recommended you make the following change in your medication:   INCREASE Spironolactone 25 mg daily   *If you need a refill on your cardiac medications before your next appointment, please call your pharmacy*   Lab Work:  None ordered  Testing/Procedures:  Your physician has recommended that you wear a Zio monitor for 2 weeks. This will be mailed to you.    This monitor is a medical device that records the heart's electrical activity. Doctors most often use these monitors to diagnose arrhythmias. Arrhythmias are problems with the speed or rhythm of the heartbeat. The monitor is a small device applied to your chest. You can wear one while you do your normal daily activities. While wearing this monitor if you have any symptoms to push the button and record what you felt. Once you have worn this monitor for the period of time provider prescribed (Usually 14 days), you will return the monitor device in the postage paid box. Once it is returned they will download the data collected and provide [place] with a report which the provider will then review and we will call you with those results. Important tips:  Avoid showering during the first 24 hours of wearing the monitor. Avoid excessive sweating to help maximize wear time. Do not submerge the device, no hot tubs, and no swimming pools. Keep any lotions or oils away from the patch. After 24 hours you may shower with the patch on. Take brief showers with your back facing the shower head.  Do not remove patch once it has been placed because that will interrupt data and decrease adhesive wear time. Push the button when you have any symptoms and write down what you were feeling. Once you have completed wearing your monitor, remove and place into box which has postage paid and place in your outgoing mailbox.  If for some reason you have misplaced your box then call our office and we can provide another box  and/or mail it off for you.   Follow-Up: At [facility], you and your health needs are our priority.  As part of our continuing mission to provide you with exceptional heart care, we have created designated Provider Care Teams.  These Care Teams include your primary Cardiologist (physician) and Advanced Practice Providers (APPs -  Physician Assistants and Nurse Practitioners) who all work together to provide you with the care you need, when you need it.  We recommend signing up for the patient portal called "MyChart".  Sign up information is provided on this After Visit Summary.  MyChart is used to connect with patients for Virtual Visits (Telemedicine).  Patients are able to view lab/test results, encounter notes, upcoming appointments, etc.  Non-urgent messages can be sent to your provider as well.   To learn more about what you can do with MyChart, go to [email or web address].    Your next appointment:   6 week(s)  The format for your next appointment:   In Person  Provider:   You will see one of the following Advanced Practice Providers on your designated Care Team:   Nicolasa Ducking, NP Eula Listen, PA-C Cadence Fransico Michael, PA-C  Other information  You have been referred to Medical Weight Management  Important Information About Sugar

## 2022-06-25 ENCOUNTER — Other Ambulatory Visit: Payer: Self-pay | Admitting: *Deleted

## 2022-06-25 MED ORDER — GLUCOSE BLOOD VI STRP: ORAL_STRIP | 12 refills | Status: DC

## 2022-06-25 NOTE — Telephone Encounter (Signed)
Rx send to pharmacy  

## 2022-06-26 NOTE — Telephone Encounter (Signed)
Patient states: - [employer] informed him that a claim had been sent to PCP office but they have not received anything back.  - He was told they need to have info on his ED visit, PCP visit, and cardiology visit to process the claim  - He was told TWICE that the claim had been received by [place] as well as medical records  Patient requests: - Confirmation if claim request had been sent to PCP or if it needs to be resent by insurance  - [employer] be informed of how long PCP would keep pt out of work

## 2022-06-30 ENCOUNTER — Telehealth: Payer: Self-pay | Admitting: Family Medicine
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
## 2022-06-30 NOTE — Telephone Encounter (Signed)
Left message to return call to our office at their convenience.  Rx requesting not in current medication list

## 2022-06-30 NOTE — Telephone Encounter (Signed)
Patient states he had an office with a Cardiologist on 06/24/22. Patient states [employer] has not received the information needed for Patient's short term disability. Patient states he really needs the information sent to [employer] (see previous messages).  Patient states [employer] has sent information requests twice with no response.  Patient requests to be called at ph# [phone] for status of [employer] forms.

## 2022-06-30 NOTE — Telephone Encounter (Signed)
  LAST APPOINTMENT DATE:  06/17/22 ED follow up with PCP   NEXT APPOINTMENT DATE: 08/21/22 PV with PCP   MEDICATION: oxyCODONE-acetaminophen (PERCOCET) 10-325 MG tablet [[id number]]  ENDED   Is the patient out of medication?  Yes, first missed dose today 08/28   PHARMACY: [id number] [place], [place] - [address] AT [facility] OF [facility] [address]  [address] [place], [place] [address]  Phone:  [phone]  Fax:  [phone]  DEA #:  [id number]

## 2022-07-01 NOTE — Telephone Encounter (Signed)
Called and spoke to pt, pt states not in pain at the moment but will call back if in significant pain to make an appt.

## 2022-07-01 NOTE — Telephone Encounter (Signed)
This is not to be a long term rx.  Cannot give any refills at this time. If he is having significant pain recommend he come back in for a visit.  [name]. [name], MD 07/01/2022 12:40 PM

## 2022-07-10 ENCOUNTER — Encounter (INDEPENDENT_AMBULATORY_CARE_PROVIDER_SITE_OTHER): Payer: BC Managed Care – PPO | Admitting: Family Medicine

## 2022-07-10 DIAGNOSIS — Z0289 Encounter for other administrative examinations: Secondary | ICD-10-CM

## 2022-07-14 ENCOUNTER — Encounter: Payer: Self-pay | Admitting: Neurology

## 2022-07-14 ENCOUNTER — Ambulatory Visit (INDEPENDENT_AMBULATORY_CARE_PROVIDER_SITE_OTHER): Payer: BC Managed Care – PPO | Admitting: Neurology

## 2022-07-14 VITALS — BP 135/82 | HR 74 | Ht 75.0 in | Wt 390.8 lb

## 2022-07-14 DIAGNOSIS — R55 Syncope and collapse: Secondary | ICD-10-CM | POA: Diagnosis not present
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
## 2022-07-14 NOTE — Patient Instructions (Signed)
Good to meet you.  Schedule MRI brain with and without contrast  2. Schedule EEG. If normal, we will plan for a 2-day home EEG  3. It is prudent to recommend that all persons should be free of syncopal episodes for at least six months to be granted the driving privilege." (THE [place] PHYSICIAN'S GUIDE TO DRIVER MEDICAL EVALUATION, Second Edition, Medical Review Branch, Associate Professor, Division of [employer] of Transportation, July 2004)   4. Follow-up after tests, call for any changes

## 2022-07-23 ENCOUNTER — Telehealth: Payer: Self-pay | Admitting: Neurology

## 2022-07-23 NOTE — Telephone Encounter (Signed)
Luis [name] was called back and informed that Luis [name] had told us that he does not need the paperwork filled out and that he was returning to work on the 18th she just needed a verbal Dr Delice Lesch was ok with him going back to work. Dr Delice Lesch was going to do his paper work she was told that Dr Delice Lesch was [facility] with him working ,

## 2022-07-23 NOTE — Telephone Encounter (Signed)
Stephanie from [employer] called to confirm the patient's return to work date.

## 2022-07-25 ENCOUNTER — Telehealth: Payer: Self-pay | Admitting: *Deleted

## 2022-07-25 NOTE — Progress Notes (Signed)
Monitor showed sinus without any found cause for syncopal episode. Continue current medications until follow up 10/5.

## 2022-07-25 NOTE — Telephone Encounter (Signed)
-----   Message from Gerrie Nordmann, NP sent at 07/25/2022  1:02 PM EDT ----- Monitor showed sinus without any found cause for syncopal episode. Continue current medications until follow up 10/5.

## 2022-07-25 NOTE — Telephone Encounter (Signed)
Left voicemail message to call back for review of results.  

## 2022-07-28 ENCOUNTER — Encounter: Payer: Self-pay | Admitting: *Deleted

## 2022-07-28 DIAGNOSIS — R55 Syncope and collapse: Secondary | ICD-10-CM

## 2022-07-29 NOTE — Telephone Encounter (Signed)
Left voicemail message to call back for review of results.  

## 2022-07-30 ENCOUNTER — Encounter (HOSPITAL_COMMUNITY): Payer: Self-pay | Admitting: Emergency Medicine

## 2022-07-30 ENCOUNTER — Ambulatory Visit (HOSPITAL_COMMUNITY)
Admission: EM | Admit: 2022-07-30 | Discharge: 2022-07-30 | Disposition: A | Discharge status: Home / Self Care | Payer: BC Managed Care – PPO

## 2022-07-30 DIAGNOSIS — S61401A Unspecified open wound of right hand, initial encounter: Secondary | ICD-10-CM | POA: Diagnosis not present

## 2022-07-30 DIAGNOSIS — Z5189 Encounter for other specified aftercare: Secondary | ICD-10-CM

## 2022-07-30 DIAGNOSIS — Z794 Long term (current) use of insulin: Secondary | ICD-10-CM | POA: Diagnosis not present

## 2022-07-30 DIAGNOSIS — E11628 Type 2 diabetes mellitus with other skin complications: Secondary | ICD-10-CM

## 2022-07-30 NOTE — Discharge Instructions (Addendum)
Please call the wound care center to set up an appointment. Follow up with your primary care provider regarding your symptoms and diabetes management.  Please go to the emergency department if symptoms worsen.
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
## 2022-07-30 NOTE — ED Provider Notes (Signed)
[place]    CSN: [id number] Arrival date & time: 07/30/22  1905      History   Chief Complaint Chief Complaint  Patient presents with   Wound Check    HPI Luis [name] is a 56 y.o. male.  Presents for wound check Over 10 years ago had right hand injury, skin avulsion off the back Reports scab for many years. The other day it was itchy, he scratched at it and scab came off revealing wound.  Denies pain. Mostly concerned about it's appearance  History of diabetes, on insulin. 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 03/2016 Echo: EF 35-40%, diff HK, GrII DD; b. 03/2017 Echo: EF 50-55%; c. 12/2021 Echo: EF 60-65%, no rwma, GrI DD, mod enlarged RV w/ nl fxn, Ao root 38mm, Asc Ao 34mm.   Difficult intubation 12/06/2020   DM (diabetes mellitus) ([place])    Essential hypertension    History of tobacco abuse    Hyperlipidemia    Morbid obesity ([place])    NICM (nonischemic cardiomyopathy) ([place])    a. 03/2016 Echo: EF 35-40%, diff HK, GrII DD; b. 03/2017 Echo: EF 50-55%; c. 12/2021 Echo: EF 60-65%, no rwma, GrI DD.   Non-obstructive CAD (coronary artery disease)    a. 03/2016 Cath: LM nl, LAD 35p, LCX nl, RCA nl. EF 35-45%.   Noncompliance    OSA (obstructive sleep apnea)    Pneumonia 2017   Premature atrial contractions    Sleep apnea     Patient Active Problem List   Diagnosis Date Noted   Swelling 08/28/2021   Morbid obesity ([place]) 05/27/2021   Difficult intubation 12/06/2020   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   Adjustment reaction 04/10/2020   Venous stasis dermatitis of both lower extremities 11/10/2019   Pain due to onychomycosis of toenails of both feet 04/29/2019   Diabetes ([place]) 02/02/2018   Renal insufficiency 01/01/2018   Onychomycosis, followed by Podiatry 06/14/2017   Bilateral edema of lower  extremity 06/14/2017   Hyperlipidemia associated with type 2 diabetes mellitus ([place])    Difficulty walking, debility, deconditioned 06/11/2017   OSA (obstructive sleep apnea), mild, not using CPAP 03/31/2017   NICM (nonischemic cardiomyopathy) ([place]), left heart cath and coronary angiography 2017    Former smoker    Chronic diastolic CHF (congestive heart failure) ([place]), ECHO 03/2017 EF 50-55%, on Torsemide    Cough    Hypertension associated with diabetes ([place]) 03/19/2016   Paroxysmal atrial fibrillation ([place]) 03/19/2016    Past Surgical History:  Procedure Laterality Date   CARDIAC CATHETERIZATION N/A 03/24/2016   Procedure: Left Heart Cath and Coronary Angiography;  Surgeon: Leonie Man, MD;  Location: [facility];  Service: Cardiovascular;  Laterality: N/A;   FEMUR IM NAIL Right 2012   FRACTURE SURGERY         Home Medications    Prior to Admission medications   Medication Sig Start Date End Date Taking? Authorizing Provider  aspirin EC 81 MG EC tablet Take 1 tablet (81 mg total) by mouth daily. 03/26/16   Theodis Blaze, MD  carvedilol (COREG) 25 MG tablet Take 1 tablet (25 mg total) by mouth 2 (two) times daily. 12/26/21   Furth, Cadence H, PA-C  cyclobenzaprine (FLEXERIL) 10 MG tablet Take 1 tablet (10 mg total) by mouth 2 (two) times  daily as needed for muscle spasms. 06/11/22   Alvira Monday, MD  empagliflozin (JARDIANCE) 10 MG TABS tablet Take 1 tablet (10 mg total) by mouth daily before breakfast. 04/03/22   Ardith Dark, MD  glucose blood test strip Check glucose TID/ E11.9 06/25/22   Ardith Dark, MD  ibuprofen (ADVIL) 800 MG tablet Take 1 tablet (800 mg total) by mouth every 8 (eight) hours as needed. 06/17/22   Ardith Dark, MD  insulin aspart protamine - aspart (NOVOLOG MIX 70/30 FLEXPEN) (70-30) 100 UNIT/ML FlexPen Inject 22 Units into the skin 2 (two) times daily. 04/03/22   Ardith Dark, MD  Insulin Pen Needle 31G X 8 MM MISC Use to inject insulin  twice daily 04/13/19   Ardith Dark, MD  Insulin Pen Needle 32G X 6 MM MISC Use to administer insulin two times daily 04/05/18   Ardith Dark, MD  OneTouch Delica Lancets 33G MISC Use TID/ E11.9 06/06/21   Ardith Dark, MD  potassium chloride SA (KLOR-CON M) 20 MEQ tablet Take 1 tablet (20 mEq total) by mouth daily. 04/29/22   Ardith Dark, MD  simvastatin (ZOCOR) 20 MG tablet TAKE 1 TABLET(20 MG) BY MOUTH AT BEDTIME 05/13/22   Ardith Dark, MD  spironolactone (ALDACTONE) 25 MG tablet Take 1 tablet (25 mg total) by mouth daily. 06/24/22 08/23/22  Charlsie Quest, NP  torsemide (DEMADEX) 10 MG tablet TAKE 1 TABLET(10 MG) BY MOUTH TWICE DAILY 06/09/22   Ardith Dark, MD    Family History Family History  Problem Relation Age of Onset   Diabetes Mellitus II Mother    Hypertension Maternal Grandfather    Diabetes type II Other    Stroke Neg Hx    Cancer Neg Hx    Colon cancer Neg Hx    Colon polyps Neg Hx    Esophageal cancer Neg Hx    Rectal cancer Neg Hx    Stomach cancer Neg Hx     Social History Social History   Tobacco Use   Smoking status: Every Day    Packs/day: 0.50    Years: 20.00    Total pack years: 10.00    Types: Cigarettes    Last attempt to quit: 03/02/2009    Years since quitting: 13.4   Smokeless tobacco: Never  Vaping Use   Vaping Use: Never used  Substance Use Topics   Alcohol use: Yes    Comment: occassional   Drug use: No     Allergies   Demerol [meperidine] and Morphine and related   Review of Systems Review of Systems  Per HPI  Physical Exam Triage Vital Signs ED Triage Vitals  Enc Vitals Group     BP 07/30/22 2005 (!) 152/93     Pulse Rate 07/30/22 2005 85     Resp 07/30/22 2005 20     Temp 07/30/22 2005 98.7 F (37.1 C)     Temp Source 07/30/22 2005 Oral     SpO2 07/30/22 2005 94 %     Weight --      Height --      Head Circumference --      Peak Flow --      Pain Score 07/30/22 2003 1     Pain Loc --      Pain Edu? --       Excl. in GC? --    No data found.  Updated Vital Signs BP (!) 152/93 (BP Location: Left  Arm)   Pulse 85   Temp 98.7 F (37.1 C) (Oral)   Resp 20   SpO2 94%   Visual Acuity Right Eye Distance:   Left Eye Distance:   Bilateral Distance:    Right Eye Near:   Left Eye Near:    Bilateral Near:     Physical Exam   UC Treatments / Results  Labs (all labs ordered are listed, but only abnormal results are displayed) Labs Reviewed - No data to display  EKG   Radiology No results found.  Procedures Procedures (including critical care time)  Medications Ordered in UC Medications - No data to display  Initial Impression / Assessment and Plan / UC Course  I have reviewed the triage vital signs and the nursing notes.  Pertinent labs & imaging results that were available during my care of the patient were reviewed by me and considered in my medical decision making (see chart for details).     *** Final Clinical Impressions(s) / UC Diagnoses   Final diagnoses:  Visit for wound check  Type 2 diabetes mellitus with other skin complication, with long-term current use of insulin [facility])     Discharge Instructions      Please call the wound care center to set up an appointment. Follow up with your primary care provider regarding your symptoms and diabetes management.  Please go to the emergency department if symptoms worsen.   ED Prescriptions   None    PDMP not reviewed this encounter.

## 2022-07-31 ENCOUNTER — Other Ambulatory Visit: Payer: Self-pay | Admitting: Neurology

## 2022-07-31 ENCOUNTER — Ambulatory Visit
Admission: RE | Admit: 2022-07-31 | Discharge: 2022-07-31 | Disposition: A | Discharge status: Home / Self Care | Payer: BC Managed Care – PPO | Source: Ambulatory Visit | Attending: Neurology | Admitting: Neurology

## 2022-07-31 ENCOUNTER — Ambulatory Visit (INDEPENDENT_AMBULATORY_CARE_PROVIDER_SITE_OTHER): Payer: BC Managed Care – PPO | Admitting: Family Medicine

## 2022-07-31 ENCOUNTER — Encounter (INDEPENDENT_AMBULATORY_CARE_PROVIDER_SITE_OTHER): Payer: Self-pay | Admitting: Family Medicine

## 2022-07-31 VITALS — BP 112/70 | HR 80 | Temp 97.4°F | Ht 75.0 in | Wt 383.0 lb

## 2022-07-31 DIAGNOSIS — E559 Vitamin D deficiency, unspecified: Secondary | ICD-10-CM | POA: Diagnosis not present

## 2022-07-31 DIAGNOSIS — R0602 Shortness of breath: Secondary | ICD-10-CM | POA: Diagnosis not present

## 2022-07-31 DIAGNOSIS — E1169 Type 2 diabetes mellitus with other specified complication: Secondary | ICD-10-CM | POA: Insufficient documentation

## 2022-07-31 DIAGNOSIS — F3289 Other specified depressive episodes: Secondary | ICD-10-CM

## 2022-07-31 DIAGNOSIS — E669 Obesity, unspecified: Secondary | ICD-10-CM

## 2022-07-31 DIAGNOSIS — R5383 Other fatigue: Secondary | ICD-10-CM

## 2022-07-31 DIAGNOSIS — R55 Syncope and collapse: Secondary | ICD-10-CM | POA: Diagnosis not present

## 2022-07-31 DIAGNOSIS — G4733 Obstructive sleep apnea (adult) (pediatric): Secondary | ICD-10-CM | POA: Diagnosis not present

## 2022-07-31 DIAGNOSIS — I4891 Unspecified atrial fibrillation: Secondary | ICD-10-CM

## 2022-07-31 DIAGNOSIS — Z7984 Long term (current) use of oral hypoglycemic drugs: Secondary | ICD-10-CM

## 2022-07-31 DIAGNOSIS — R600 Localized edema: Secondary | ICD-10-CM | POA: Diagnosis not present

## 2022-07-31 DIAGNOSIS — Z6841 Body Mass Index (BMI) 40.0 and over, adult: Secondary | ICD-10-CM

## 2022-07-31 DIAGNOSIS — F32A Depression, unspecified: Secondary | ICD-10-CM | POA: Insufficient documentation

## 2022-07-31 DIAGNOSIS — Z794 Long term (current) use of insulin: Secondary | ICD-10-CM

## 2022-08-01 ENCOUNTER — Encounter: Payer: Self-pay | Admitting: *Deleted

## 2022-08-01 NOTE — Telephone Encounter (Signed)
Reviewed results and recommendations with patient and he verbalized understanding with no further questions at this time.  

## 2022-08-01 NOTE — Telephone Encounter (Signed)
Returned patients call but had to leave a voicemail message to call back.

## 2022-08-01 NOTE — Telephone Encounter (Signed)
Patient is returning call to discuss monitor results. 

## 2022-08-01 NOTE — Telephone Encounter (Signed)
Left voicemail message that I was calling with his monitor results and to confirm his upcoming appointment.

## 2022-08-02 LAB — CBC WITH DIFFERENTIAL/PLATELET
Basophils Absolute: 0 10*3/uL (ref 0.0–0.2)
Basos: 0 %
EOS (ABSOLUTE): 0.3 10*3/uL (ref 0.0–0.4)
Eos: 3 %
Hematocrit: 45.1 % (ref 37.5–51.0)
Hemoglobin: 14.9 g/dL (ref 13.0–17.7)
Immature Grans (Abs): 0 10*3/uL (ref 0.0–0.1)
Immature Granulocytes: 0 %
Lymphocytes Absolute: 2 10*3/uL (ref 0.7–3.1)
Lymphs: 26 %
MCH: 32.3 pg (ref 26.6–33.0)
MCHC: 33 g/dL (ref 31.5–35.7)
MCV: 98 fL — ABNORMAL HIGH (ref 79–97)
Monocytes Absolute: 0.5 10*3/uL (ref 0.1–0.9)
Monocytes: 7 %
Neutrophils Absolute: 4.7 10*3/uL (ref 1.4–7.0)
Neutrophils: 64 %
Platelets: 229 10*3/uL (ref 150–450)
RBC: 4.62 x10E6/uL (ref 4.14–5.80)
RDW: 14 % (ref 11.6–15.4)
WBC: 7.6 10*3/uL (ref 3.4–10.8)

## 2022-08-02 LAB — COMPREHENSIVE METABOLIC PANEL
ALT: 13 IU/L (ref 0–44)
AST: 15 IU/L (ref 0–40)
Albumin/Globulin Ratio: 1.2 (ref 1.2–2.2)
Albumin: 3.9 g/dL (ref 3.8–4.9)
Alkaline Phosphatase: 156 IU/L — ABNORMAL HIGH (ref 44–121)
BUN/Creatinine Ratio: 15 (ref 9–20)
BUN: 17 mg/dL (ref 6–24)
Bilirubin Total: 0.4 mg/dL (ref 0.0–1.2)
CO2: 23 mmol/L (ref 20–29)
Calcium: 8.9 mg/dL (ref 8.7–10.2)
Chloride: 100 mmol/L (ref 96–106)
Creatinine, Ser: 1.1 mg/dL (ref 0.76–1.27)
Globulin, Total: 3.2 g/dL (ref 1.5–4.5)
Glucose: 120 mg/dL — ABNORMAL HIGH (ref 70–99)
Potassium: 4 mmol/L (ref 3.5–5.2)
Sodium: 140 mmol/L (ref 134–144)
Total Protein: 7.1 g/dL (ref 6.0–8.5)
eGFR: 80 mL/min/{1.73_m2} (ref >59)

## 2022-08-02 LAB — LIPID PANEL
Chol/HDL Ratio: 4.5 ratio (ref 0.0–5.0)
Cholesterol, Total: 145 mg/dL (ref 100–199)
HDL: 32 mg/dL — ABNORMAL LOW (ref >39)
LDL Chol Calc (NIH): 94 mg/dL (ref 0–99)
Triglycerides: 103 mg/dL (ref 0–149)
VLDL Cholesterol Cal: 19 mg/dL (ref 5–40)

## 2022-08-02 LAB — T4, FREE: Free T4: 1.42 ng/dL (ref 0.82–1.77)

## 2022-08-02 LAB — FOLATE: Folate: 6.2 ng/mL (ref >3.0)

## 2022-08-02 LAB — HEMOGLOBIN A1C
Est. average glucose Bld gHb Est-mCnc: 160 mg/dL
Hgb A1c MFr Bld: 7.2 % — ABNORMAL HIGH (ref 4.8–5.6)

## 2022-08-02 LAB — VITAMIN D 25 HYDROXY (VIT D DEFICIENCY, FRACTURES): Vit D, 25-Hydroxy: 9.4 ng/mL — ABNORMAL LOW (ref 30.0–100.0)

## 2022-08-02 LAB — VITAMIN B12: Vitamin B-12: 286 pg/mL (ref 232–1245)

## 2022-08-02 LAB — TSH: TSH: 1.56 u[IU]/mL (ref 0.450–4.500)

## 2022-08-02 LAB — INSULIN, RANDOM: INSULIN: 36.2 u[IU]/mL — ABNORMAL HIGH (ref 2.6–24.9)

## 2022-08-04 NOTE — Progress Notes (Unsigned)
Chief Complaint:   OBESITY TALLEY Luis [name] (MR# [id number]) is a 56 y.o. male who presents for evaluation and treatment of obesity and related comorbidities. Current BMI is Body mass index is 47.87 kg/m. Luis [name] has been struggling with his weight for many years and has been unsuccessful in either losing weight, maintaining weight loss, or reaching his healthy weight goal.  Luis [name] started to gain weight after college.  His weight has slowly increased.  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  He does not like to cook, does boredom eating, large volumes.  He has a history of ETOH abuse and he drinks sweet tea.  He also has a sister that is obese. He has never used AOM's. He has a long history of mindless eating.  He eats at a fast pace.  He is lactose intolerant.  He still drinks liquor on occasion.    Luis [name] is currently in the action stage of change and ready to dedicate time achieving and maintaining a healthier weight. Luis [name] is interested in becoming our patient and working on intensive lifestyle modifications including (but not limited to) diet and exercise for weight loss.  Luis [name]'s habits were reviewed today and are as follows: {MWM WT HABITS:23461}.  Depression Screen Luis [name]'s Food and Mood (modified PHQ-9) score was 13.     07/31/2022    9:45 AM  Depression screen PHQ 2/9  Decreased Interest 3  Down, Depressed, Hopeless 1  PHQ - 2 Score 4  Altered sleeping 3  Tired, decreased energy 3  Change in appetite 0  Feeling bad or failure about yourself  0  Trouble concentrating 0  Moving slowly or fidgety/restless 3  Suicidal thoughts 0  PHQ-9 Score 13  Difficult doing work/chores Extremely dIfficult   Subjective:   1. SOBOE (shortness of breath on exertion) Audry notes increasing shortness of breath with exercising and seems to be worsening over time with weight gain. He notes getting out of breath sooner with activity than he used to. This {HAS HAS PJK:93267} gotten  worse recently. Isaish denies shortness of breath at rest or orthopnea. Fatigue and SOB likely due to OSA, not using CPAP at night, deconditioning, and CHF.  Actual BMR 3355.    2. Other fatigue Luis [name] {Actions; denies/reports/admits to:19208} daytime somnolence and {Actions; denies/reports/admits to:19208} waking up still tired. Patient has a history of symptoms of {OSA Sx:17850}. Fowler generally gets {numbers (fuzzy):14653} hours of sleep per night, and states that he has {sleep quality:17851}. Snoring {is/are not:32546} present. Apneic episodes {is/are not:32546} present. Epworth Sleepiness Score is ***.  Fatigue and SOB likely due to OSA, not using CPAP at night, deconditioning, and CHF.  Actual BMR 3355.   3. Type 2 diabetes mellitus with other specified complication, unspecified whether long term insulin use ([place]) He is on insulin 70/30, 24 units BID and Jardiance 10 mg daily. He is off GLP-1 agonist due to cost.  (Trul[place]) Had diarrhea.  [place]  4. Lower extremity edema ***  5. OSA (obstructive sleep apnea) ***  6. Atrial fibrillation, unspecified type (HCC) ***  7. Other depression with emotional eating ***  Assessment/Plan:   1. SOBOE (shortness of breath on exertion) Luis [name] {DOES_DOES TIW:58099} feel that he gets out of breath more easily that he used to when he exercises. Luis [name]'s shortness of breath appears to be obesity related and exercise induced. He has agreed to work on weight loss and gradually increase exercise to treat his exercise induced shortness of breath. Will continue to  monitor closely. Obtained labs today, reviewed August 2023 EKG, IC results.   2. Other fatigue Luis [name] {DOES_DOES FFM:38466} feel that his weight is causing his energy to be lower than it should be. Fatigue may be related to obesity, depression or many other causes. Labs will be ordered, and in the meanwhile, Luis [name] will focus on self care including making healthy food choices, increasing  physical activity and focusing on stress reduction. Obtained labs today, reviewed August 2023 EKG, IC results.   Check labs today.   - VITAMIN D 25 Hydroxy (Vit-D Deficiency, Fractures) - TSH - T4, free - Lipid panel - Insulin, random - Hemoglobin A1c - Folate - Comprehensive metabolic panel - Vitamin B12 - CBC with Differential/Platelet  3. Type 2 diabetes mellitus with other specified complication, unspecified whether long term insulin use (HCC) *** - Insulin, random - Hemoglobin A1c  4. Lower extremity edema ***  5. OSA (obstructive sleep apnea) ***  6. Atrial fibrillation, unspecified type (HCC) ***  7. Other depression with emotional eating ***  8. Obesity,current BMI 47.87 Handout:  100 calorie snack sheet.   Luis [name] is currently in the action stage of change and his goal is to continue with weight loss efforts. I recommend Luis [name] begin the structured treatment plan as follows:  He has agreed to the Category 4 Plan.  Exercise goals:  As is.     Behavioral modification strategies: increasing lean protein intake, increasing vegetables, increasing water intake, decreasing eating out, no skipping meals, meal planning and cooking strategies, keeping healthy foods in the home, better snacking choices, and decreasing junk food.  He was informed of the importance of frequent follow-up visits to maximize his success with intensive lifestyle modifications for his multiple health conditions. He was informed we would discuss his lab results at his next visit unless there is a critical issue that needs to be addressed sooner. Luis [name] agreed to keep his next visit at the agreed upon time to discuss these results.  Objective:   Blood pressure 112/70, pulse 80, temperature (!) 97.4 F (36.3 C), height 6\' 3"  (1.905 m), weight (!) 383 lb (173.7 kg), SpO2 96 %. Body mass index is 47.87 kg/m.  EKG: Normal sinus rhythm, rate 78 BPM.  Indirect Calorimeter completed today shows  a VO2 of 486 and a REE of 3355.  His calculated basal metabolic rate is "unable to obtain" thus his basal metabolic rate is "unable to determine" than expected.  General: Cooperative, alert, well developed, in no acute distress. HEENT: Conjunctivae and lids unremarkable. Cardiovascular: Regular rhythm.  Lungs: Normal work of breathing. Neurologic: No focal deficits.   Lab Results  Component Value Date   CREATININE 1.10 07/31/2022   BUN 17 07/31/2022   NA 140 07/31/2022   K 4.0 07/31/2022   CL 100 07/31/2022   CO2 23 07/31/2022   Lab Results  Component Value Date   ALT 13 07/31/2022   AST 15 07/31/2022   ALKPHOS 156 (H) 07/31/2022   BILITOT 0.4 07/31/2022   Lab Results  Component Value Date   HGBA1C 7.2 (H) 07/31/2022   HGBA1C 6.8 (A) 04/03/2022   HGBA1C 7.4 (H) 10/03/2021   HGBA1C 6.8 (A) 02/21/2021   HGBA1C 6.0 (A) 08/23/2020   Lab Results  Component Value Date   INSULIN 36.2 (H) 07/31/2022   Lab Results  Component Value Date   TSH 1.560 07/31/2022   Lab Results  Component Value Date   CHOL 145 07/31/2022   HDL 32 (L) 07/31/2022  LDLCALC 94 07/31/2022   TRIG 103 07/31/2022   CHOLHDL 4.5 07/31/2022   Lab Results  Component Value Date   WBC 7.6 07/31/2022   HGB 14.9 07/31/2022   HCT 45.1 07/31/2022   MCV 98 (H) 07/31/2022   PLT 229 07/31/2022   No results found for: "IRON", "TIBC", "FERRITIN"  Attestation Statements:   Reviewed by clinician on day of visit: allergies, medications, problem list, medical history, surgical history, family history, social history, and previous encounter notes.  I, Davy Pique, am acting as Location manager for Loyal Gambler, DO.  I have reviewed the above documentation for accuracy and completeness, and I agree with the above. - ***

## 2022-08-06 ENCOUNTER — Other Ambulatory Visit: Payer: Self-pay | Admitting: Family Medicine
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
## 2022-08-07 ENCOUNTER — Other Ambulatory Visit: Payer: Self-pay

## 2022-08-07 ENCOUNTER — Emergency Department (HOSPITAL_COMMUNITY): Payer: BC Managed Care – PPO

## 2022-08-07 ENCOUNTER — Ambulatory Visit: Payer: BC Managed Care – PPO | Attending: Cardiology | Admitting: Cardiology

## 2022-08-07 ENCOUNTER — Emergency Department (HOSPITAL_COMMUNITY)
Admission: EM | Admit: 2022-08-07 | Discharge: 2022-08-08 | Disposition: A | Discharge status: Home / Self Care | Payer: BC Managed Care – PPO | Attending: Emergency Medicine | Admitting: Emergency Medicine

## 2022-08-07 ENCOUNTER — Encounter: Payer: Self-pay | Admitting: Cardiology

## 2022-08-07 ENCOUNTER — Encounter (HOSPITAL_COMMUNITY): Payer: Self-pay

## 2022-08-07 VITALS — BP 100/60 | HR 83 | Temp 98.2°F | Ht 75.0 in | Wt 384.1 lb

## 2022-08-07 DIAGNOSIS — Z794 Long term (current) use of insulin: Secondary | ICD-10-CM | POA: Diagnosis not present

## 2022-08-07 DIAGNOSIS — Z79899 Other long term (current) drug therapy: Secondary | ICD-10-CM | POA: Insufficient documentation

## 2022-08-07 DIAGNOSIS — R918 Other nonspecific abnormal finding of lung field: Secondary | ICD-10-CM | POA: Diagnosis not present

## 2022-08-07 DIAGNOSIS — J40 Bronchitis, not specified as acute or chronic: Secondary | ICD-10-CM

## 2022-08-07 DIAGNOSIS — E119 Type 2 diabetes mellitus without complications: Secondary | ICD-10-CM | POA: Diagnosis not present

## 2022-08-07 DIAGNOSIS — I5022 Chronic systolic (congestive) heart failure: Secondary | ICD-10-CM | POA: Diagnosis not present

## 2022-08-07 DIAGNOSIS — J441 Chronic obstructive pulmonary disease with (acute) exacerbation: Secondary | ICD-10-CM | POA: Insufficient documentation

## 2022-08-07 DIAGNOSIS — R55 Syncope and collapse: Secondary | ICD-10-CM

## 2022-08-07 DIAGNOSIS — R0602 Shortness of breath: Secondary | ICD-10-CM | POA: Diagnosis not present

## 2022-08-07 DIAGNOSIS — J209 Acute bronchitis, unspecified: Secondary | ICD-10-CM | POA: Diagnosis not present

## 2022-08-07 DIAGNOSIS — I501 Left ventricular failure: Secondary | ICD-10-CM | POA: Diagnosis not present

## 2022-08-07 DIAGNOSIS — G4733 Obstructive sleep apnea (adult) (pediatric): Secondary | ICD-10-CM

## 2022-08-07 DIAGNOSIS — I428 Other cardiomyopathies: Secondary | ICD-10-CM | POA: Diagnosis not present

## 2022-08-07 DIAGNOSIS — L089 Local infection of the skin and subcutaneous tissue, unspecified: Secondary | ICD-10-CM

## 2022-08-07 DIAGNOSIS — R0989 Other specified symptoms and signs involving the circulatory and respiratory systems: Secondary | ICD-10-CM | POA: Diagnosis not present

## 2022-08-07 DIAGNOSIS — R059 Cough, unspecified: Secondary | ICD-10-CM | POA: Diagnosis not present

## 2022-08-07 DIAGNOSIS — R5383 Other fatigue: Secondary | ICD-10-CM | POA: Diagnosis not present

## 2022-08-07 DIAGNOSIS — I10 Essential (primary) hypertension: Secondary | ICD-10-CM | POA: Diagnosis not present

## 2022-08-07 DIAGNOSIS — Z20822 Contact with and (suspected) exposure to covid-19: Secondary | ICD-10-CM | POA: Diagnosis not present

## 2022-08-07 DIAGNOSIS — L0291 Cutaneous abscess, unspecified: Secondary | ICD-10-CM

## 2022-08-07 LAB — CBC
HCT: 42.5 % (ref 39.0–52.0)
Hemoglobin: 14.3 g/dL (ref 13.0–17.0)
MCH: 32.9 pg (ref 26.0–34.0)
MCHC: 33.6 g/dL (ref 30.0–36.0)
MCV: 97.9 fL (ref 80.0–100.0)
Platelets: 255 10*3/uL (ref 150–400)
RBC: 4.34 MIL/uL (ref 4.22–5.81)
RDW: 14.3 % (ref 11.5–15.5)
WBC: 12.1 10*3/uL — ABNORMAL HIGH (ref 4.0–10.5)
nRBC: 0 % (ref 0.0–0.2)

## 2022-08-07 LAB — BASIC METABOLIC PANEL
Anion gap: 10 (ref 5–15)
BUN: 18 mg/dL (ref 6–20)
CO2: 23 mmol/L (ref 22–32)
Calcium: 8.7 mg/dL — ABNORMAL LOW (ref 8.9–10.3)
Chloride: 104 mmol/L (ref 98–111)
Creatinine, Ser: 1.18 mg/dL (ref 0.61–1.24)
GFR, Estimated: >60 mL/min (ref >60)
Glucose, Bld: 107 mg/dL — ABNORMAL HIGH (ref 70–99)
Potassium: 3.9 mmol/L (ref 3.5–5.1)
Sodium: 137 mmol/L (ref 135–145)

## 2022-08-07 LAB — BRAIN NATRIURETIC PEPTIDE: B Natriuretic Peptide: 46.3 pg/mL (ref 0.0–100.0)

## 2022-08-07 MED ORDER — CEPHALEXIN 500 MG PO CAPS: 500.0000 mg | ORAL_CAPSULE | Freq: Two times a day (BID) | ORAL | 0 refills | Status: DC

## 2022-08-07 MED ORDER — BENZONATATE 100 MG PO CAPS: 200.0000 mg | ORAL_CAPSULE | Freq: Four times a day (QID) | ORAL | 0 refills | Status: AC | PRN

## 2022-08-07 NOTE — ED Provider Triage Note (Signed)
Emergency Medicine Provider Triage Evaluation Note  Luis [name] , a 56 y.o. male  was evaluated in triage.  Pt complains of pneumonia.  Patient states symptoms began last night to include chest congestion, shortness of breath.  The patient reports he went to the urgent care this morning, had x-ray done which did show a pneumonia.  The patient was sent to the ED for further management.  Patient current oxygen saturation 90% on room air.  The patient is endorsing lightheadedness, dizziness, shortness of breath and weakness.  Patient denies any fevers.  Patient reports that he has had multiple instances of pneumonia in the past.  Review of Systems  Positive:  Negative:   Physical Exam  BP 127/69   Pulse 65   Temp 98.1 F (36.7 C) (Oral)   Resp 20   Ht 6\' 3"  (1.905 m)   Wt (!) 174.2 kg   SpO2 90%   BMI 48.00 kg/m  Gen:   Awake, no distress   Resp:  Normal effort  MSK:   Moves extremities without difficulty  Other:    Medical Decision Making  Medically screening exam initiated at 3:40 PM.  Appropriate orders placed.  Luis [name] was informed that the remainder of the evaluation will be completed by another provider, this initial triage assessment does not replace that evaluation, and the importance of remaining in the ED until their evaluation is complete.     Azucena Cecil, PA-C 08/07/22 1541

## 2022-08-07 NOTE — Patient Instructions (Signed)
Medication Instructions:   Start Keflex - Take 1 capsule (500 mg total) by mouth 2 (two) times daily for 10 days  Start TESSALON PERLES - Take 2 capsules (200 mg total) by mouth every 6 (six) hours as needed for up to 14 days for cough.  *If you need a refill on your cardiac medications before your next appointment, please call your pharmacy*   Lab Work:  None ordered  If you have labs (blood work) drawn today and your tests are completely normal, you will receive your results only by: [place] (if you have MyChart) OR A paper copy in the mail If you have any lab test that is abnormal or we need to change your treatment, we will call you to review the results.   Testing/Procedures:  None Ordered   Follow-Up: At [facility], you and your health needs are our priority.  As part of our continuing mission to provide you with exceptional heart care, we have created designated Provider Care Teams.  These Care Teams include your primary Cardiologist (physician) and Advanced Practice Providers (APPs -  Physician Assistants and Nurse Practitioners) who all work together to provide you with the care you need, when you need it.  We recommend signing up for the patient portal called "MyChart".  Sign up information is provided on this After Visit Summary.  MyChart is used to connect with patients for Virtual Visits (Telemedicine).  Patients are able to view lab/test results, encounter notes, upcoming appointments, etc.  Non-urgent messages can be sent to your provider as well.   To learn more about what you can do with MyChart, go to [email or web address].    Your next appointment:   2 month(s)  The format for your next appointment:   In Person  Provider:   You may see Lauree Chandler, MD or one of the following Advanced Practice Providers on your designated Care Team:   Murray Hodgkins, NP Christell Faith, PA-C Cadence Kathlen Mody, PA-C Gerrie Nordmann, NP

## 2022-08-07 NOTE — ED Triage Notes (Signed)
Reports started coughing and having difficulty breathing yesterday.  Reports went to UC and dx with PNA and told to come here.  Also had negative covid test at [facility].  Complains of dizziness as well.

## 2022-08-08 MED ORDER — IPRATROPIUM-ALBUTEROL 0.5-2.5 (3) MG/3ML IN SOLN
3.0000 mL | Freq: Once | RESPIRATORY_TRACT | Status: AC
  Administered 2022-08-08: 3 mL via RESPIRATORY_TRACT
  Filled 2022-08-08: qty 3

## 2022-08-08 MED ORDER — ALBUTEROL SULFATE HFA 108 (90 BASE) MCG/ACT IN AERS
2.0000 | INHALATION_SPRAY | Freq: Once | RESPIRATORY_TRACT | Status: AC
  Administered 2022-08-08: 2 via RESPIRATORY_TRACT
  Filled 2022-08-08: qty 6.7

## 2022-08-08 MED ORDER — DOXYCYCLINE HYCLATE 100 MG PO CAPS: 100.0000 mg | ORAL_CAPSULE | Freq: Two times a day (BID) | ORAL | 0 refills | Status: DC

## 2022-08-08 MED ORDER — AEROCHAMBER PLUS FLO-VU LARGE MISC
1.0000 | Freq: Once | Status: AC
  Administered 2022-08-08: 1

## 2022-08-08 NOTE — ED Provider Notes (Signed)
[facility] EMERGENCY DEPARTMENT Provider Note  CSN: [id number] Arrival date & time: 08/07/22 1428  Chief Complaint(s) Pneumonia  HPI Luis [name] is a 56 y.o. male with a past medical history listed below including hypertension, hyperlipidemia, diabetes, chronic systolic/diastolic heart failure with a last EF of 60 to 65% on an echo performed in February of this year, 18 pack smoking history who presents to the emergency department for 1 day of gradually worsening shortness of breath with productive cough of green sputum.  Similar to prior episodes of "pneumonia.  He denies any fevers or chills.  No chest pain.  Shortness of breath is constant and worse with exertion and coughing.  Alleviated by being still.  Patient has chronic peripheral edema which has been stable.  He is compliant with his diuretics.  Denies any known sick contacts but was around folks over the weekend.  Patient was seen by cardiology earlier today and also urgent care who diagnosed him with possible pneumonia versus CHF exacerbation and recommended he present to the emergency department for further evaluation. He tested negative for COVID at [facility].  Of note patient has had a recurrent nuchal abscess that has been draining for several days.  He was prescribed Keflex by the cardiologist earlier today which she has not picked up.  The history is provided by the patient.    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 03/2016 Echo: EF 35-40%, diff HK, GrII DD; b. 03/2017 Echo: EF 50-55%; c. 12/2021 Echo: EF 60-65%, no rwma, GrI DD, mod enlarged RV w/ nl fxn, Ao root 55mm, Asc Ao 4mm.   Difficult intubation 12/06/2020   DM (diabetes mellitus) (HCC)    Edema    both ankles and feet   Essential hypertension    History of tobacco abuse    Hyperlipidemia    Morbid obesity (HCC)    NICM (nonischemic cardiomyopathy)  (HCC)    a. 03/2016 Echo: EF 35-40%, diff HK, GrII DD; b. 03/2017 Echo: EF 50-55%; c. 12/2021 Echo: EF 60-65%, no rwma, GrI DD.   Non-obstructive CAD (coronary artery disease)    a. 03/2016 Cath: LM nl, LAD 35p, LCX nl, RCA nl. EF 35-45%.   Noncompliance    OSA (obstructive sleep apnea)    Pneumonia 2017   Premature atrial contractions    Shortness of breath    Sleep apnea    Patient Active Problem List   Diagnosis Date Noted   SOBOE (shortness of breath on exertion) 07/31/2022   Other fatigue 07/31/2022   Type 2 diabetes mellitus with other specified complication (HCC) 07/31/2022   Lower extremity edema 07/31/2022   Depression 07/31/2022   Atrial fibrillation (HCC) 07/31/2022   Swelling 08/28/2021   Morbid obesity (HCC) 05/27/2021   Difficult intubation 12/06/2020   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   Adjustment reaction 04/10/2020   Venous stasis dermatitis of both lower extremities 11/10/2019   Pain due to onychomycosis of toenails of both feet 04/29/2019   Diabetes (HCC) 02/02/2018   Renal insufficiency 01/01/2018   Onychomycosis, followed by Podiatry 06/14/2017   Bilateral edema of lower extremity 06/14/2017   Hyperlipidemia associated with type 2 diabetes mellitus (HCC)    Difficulty walking, debility, deconditioned 06/11/2017   OSA (obstructive sleep apnea), mild, not using CPAP 03/31/2017   NICM (nonischemic cardiomyopathy) (HCC), left heart cath and coronary angiography 2017    Former smoker  Chronic diastolic CHF (congestive heart failure) (HCC), ECHO 03/2017 EF 50-55%, on Torsemide    Cough    Hypertension associated with diabetes (HCC) 03/19/2016   Paroxysmal atrial fibrillation (HCC) 03/19/2016   Home Medication(s) Prior to Admission medications   Medication Sig Start Date End Date Taking? Authorizing Provider  doxycycline (VIBRAMYCIN) 100 MG capsule Take 1 capsule (100 mg total) by mouth 2 (two) times daily. 08/08/22  Yes Koraima Albertsen, Amadeo Garnet, MD  aspirin EC 81 MG EC  tablet Take 1 tablet (81 mg total) by mouth daily. 03/26/16   Dorothea Ogle, MD  benzonatate (TESSALON PERLES) 100 MG capsule Take 2 capsules (200 mg total) by mouth every 6 (six) hours as needed for up to 14 days for cough. 08/07/22 08/21/22  Charlsie Quest, NP  carvedilol (COREG) 25 MG tablet Take 1 tablet (25 mg total) by mouth 2 (two) times daily. 12/26/21   Furth, Cadence H, PA-C  cyclobenzaprine (FLEXERIL) 10 MG tablet Take 1 tablet (10 mg total) by mouth 2 (two) times daily as needed for muscle spasms. 06/11/22   Alvira Monday, MD  empagliflozin (JARDIANCE) 10 MG TABS tablet Take 1 tablet (10 mg total) by mouth daily before breakfast. Patient not taking: Reported on 08/07/2022 04/03/22   Ardith Dark, MD  glucose blood test strip Check glucose TID/ E11.9 06/25/22   Ardith Dark, MD  ibuprofen (ADVIL) 800 MG tablet Take 1 tablet (800 mg total) by mouth every 8 (eight) hours as needed. 06/17/22   Ardith Dark, MD  insulin aspart protamine - aspart (NOVOLOG MIX 70/30 FLEXPEN) (70-30) 100 UNIT/ML FlexPen Inject 22 Units into the skin 2 (two) times daily. 04/03/22   Ardith Dark, MD  Insulin Pen Needle 31G X 8 MM MISC Use to inject insulin twice daily 04/13/19   Ardith Dark, MD  Insulin Pen Needle 32G X 6 MM MISC Use to administer insulin two times daily 04/05/18   Ardith Dark, MD  OneTouch Delica Lancets 33G MISC Use TID/ E11.9 06/06/21   Ardith Dark, MD  potassium chloride SA (KLOR-CON M) 20 MEQ tablet Take 1 tablet (20 mEq total) by mouth daily. 04/29/22   Ardith Dark, MD  simvastatin (ZOCOR) 20 MG tablet TAKE 1 TABLET(20 MG) BY MOUTH AT BEDTIME 05/13/22   Ardith Dark, MD  spironolactone (ALDACTONE) 25 MG tablet Take 1 tablet (25 mg total) by mouth daily. 06/24/22 08/23/22  Charlsie Quest, NP  torsemide (DEMADEX) 10 MG tablet TAKE 1 TABLET(10 MG) BY MOUTH TWICE DAILY 08/07/22   Ardith Dark, MD                                                                                                                                     Allergies Demerol [meperidine] and Morphine and related  Review of Systems Review of Systems As noted in HPI  Physical Exam Vital Signs  I  have reviewed the triage vital signs BP (!) 143/76   Pulse 80   Temp 98.3 F (36.8 C) (Oral)   Resp (!) 22   Ht 6\' 3"  (1.905 m)   Wt (!) 174.2 kg   SpO2 94%   BMI 48.00 kg/m   Physical Exam Vitals reviewed.  Constitutional:      General: He is not in acute distress.    Appearance: He is well-developed. He is morbidly obese. He is not diaphoretic.  HENT:     Head: Normocephalic and atraumatic.     Nose: Nose normal.  Eyes:     General: No scleral icterus.       Right eye: No discharge.        Left eye: No discharge.     Conjunctiva/sclera: Conjunctivae normal.     Pupils: Pupils are equal, round, and reactive to light.  Neck:   Cardiovascular:     Rate and Rhythm: Normal rate and regular rhythm.     Heart sounds: No murmur heard.    No friction rub. No gallop.  Pulmonary:     Effort: Pulmonary effort is normal. Tachypnea present. No respiratory distress.     Breath sounds: Decreased air movement present. No stridor. Wheezing (faint exp) present. No rhonchi or rales.  Abdominal:     General: There is no distension.     Palpations: Abdomen is soft.     Tenderness: There is no abdominal tenderness.  Musculoskeletal:        General: No tenderness.     Cervical back: Normal range of motion and neck supple.     Right lower leg: 1+ Pitting Edema present.     Left lower leg: 1+ Pitting Edema present.  Skin:    General: Skin is warm and dry.     Findings: No erythema or rash.  Neurological:     Mental Status: He is alert and oriented to person, place, and time.     ED Results and Treatments Labs (all labs ordered are listed, but only abnormal results are displayed) Labs Reviewed  CBC - Abnormal; Notable for the following components:      Result Value   WBC 12.1 (*)    All  other components within normal limits  BASIC METABOLIC PANEL - Abnormal; Notable for the following components:   Glucose, Bld 107 (*)    Calcium 8.7 (*)    All other components within normal limits  BRAIN NATRIURETIC PEPTIDE                                                                                                                         EKG  EKG Interpretation  Date/Time:  Thursday August 07 2022 15:25:23 EDT Ventricular Rate:  69 PR Interval:  152 QRS Duration: 98 QT Interval:  384 QTC Calculation: 411 R Axis:   78 Text Interpretation: Normal sinus rhythm Possible Inferior infarct , age undetermined Abnormal ECG When compared with ECG of 11-Jun-2022 00:31,  PREVIOUS ECG IS PRESENT Confirmed by Drema Pry [phone]) on 08/08/2022 12:51:10 AM       Radiology DG Chest 2 View  Result Date: 08/07/2022 CLINICAL DATA:  Cough and shortness of breath. EXAM: CHEST - 2 VIEW COMPARISON:  Chest x-ray 08/07/2022 FINDINGS: The heart size and mediastinal contours are within normal limits. Both lungs are clear. The visualized skeletal structures are unremarkable. IMPRESSION: No active cardiopulmonary disease. Electronically Signed   By: Darliss Cheney M.D.   On: 08/07/2022 17:20    Medications Ordered in ED Medications  ipratropium-albuterol (DUONEB) 0.5-2.5 (3) MG/3ML nebulizer solution 3 mL (3 mLs Nebulization Given 08/08/22 0213)  ipratropium-albuterol (DUONEB) 0.5-2.5 (3) MG/3ML nebulizer solution 3 mL (3 mLs Nebulization Given 08/08/22 0343)  albuterol (VENTOLIN HFA) 108 (90 Base) MCG/ACT inhaler 2 puff (2 puffs Inhalation Given 08/08/22 0343)  AeroChamber Plus Flo-Vu Large MISC 1 each (1 each Other Given 08/08/22 0344)                                                                                                                                     Procedures Procedures  (including critical care time)  Medical Decision Making / ED Course   Medical Decision Making Amount and/or  Complexity of Data Reviewed External Data Reviewed: radiology and notes.    Details: Cards and UC notes mentioned above. Recent ECHO mentioned above  Labs: ordered. Decision-making details documented in ED Course. Radiology: ordered and independent interpretation performed. Decision-making details documented in ED Course. ECG/medicine tests: ordered and independent interpretation performed. Decision-making details documented in ED Course.  Risk Prescription drug management. Decision regarding hospitalization.   Shortness of breath.  Differential includes infectious process (viral versus bacterial), also considering bronchitis/undiagnosed COPD exacerbation.  We will rule out CHF exacerbation.  Chest x-ray will also assess for pneumothorax so I have low suspicion for this.  Low suspicion for pulmonary embolism.  CBC with mild leukocytosis.  No anemia. Metabolic panel without significant electrolyte derangements or renal sufficiency. BNP within normal limits.  Chest x-ray without evidence of pneumonia, pneumothorax, pulmonary edema or pleural effusions.  Unchanged from prior chest x-ray performed 4 months ago.  Patient provided with DuoNeb. On reassessment patient reports significant improvement after breathing treatment.  He was given additional DuoNeb and which provided additional relief.  Patient was able to ambulate without increased work of breathing or desats.  Will provide Doxy for bronchitis and abscess. DC keflex.       Final Clinical Impression(s) / ED Diagnoses Final diagnoses:  Bronchitis  COPD exacerbation (HCC)  Abscess   The patient appears reasonably screened and/or stabilized for discharge and I doubt any other medical condition or other [facility] requiring further screening, evaluation, or treatment in the ED at this time. I have discussed the findings, Dx and Tx plan with the patient/family who expressed understanding and agree(s) with the plan. Discharge instructions  discussed at length. The patient/family was given  strict return precautions who verbalized understanding of the instructions. No further questions at time of discharge.  Disposition: Discharge  Condition: Good  ED Discharge Orders          Ordered    doxycycline (VIBRAMYCIN) 100 MG capsule  2 times daily        08/08/22 0454             Follow Up: No follow-up provider specified.          This chart was dictated using voice recognition software.  Despite best efforts to proofread,  errors can occur which can change the documentation meaning.    Fatima Blank, MD 08/08/22 [phone]

## 2022-08-08 NOTE — ED Notes (Signed)
Provider requested for pt to be ambulated with o2 sats monitored. Pt ambulated to restroom and back to room and it never dropped below 94%. Pt denies any SOB during. ED provider aware and at bedside at this time

## 2022-08-08 NOTE — ED Notes (Signed)
ED provider at bedside at this time 

## 2022-08-08 NOTE — Discharge Instructions (Addendum)
Albuterol inhaler: 2-4 puffs every 4-6 hours if needed for wheezing or shortness of breath.  

## 2022-08-14 ENCOUNTER — Encounter (HOSPITAL_BASED_OUTPATIENT_CLINIC_OR_DEPARTMENT_OTHER): Payer: BC Managed Care – PPO | Attending: General Surgery | Admitting: General Surgery

## 2022-08-14 ENCOUNTER — Ambulatory Visit (INDEPENDENT_AMBULATORY_CARE_PROVIDER_SITE_OTHER): Payer: BC Managed Care – PPO | Admitting: Family Medicine

## 2022-08-14 VITALS — BP 119/70 | HR 79 | Temp 97.5°F | Ht 75.0 in | Wt 375.0 lb

## 2022-08-14 DIAGNOSIS — I110 Hypertensive heart disease with heart failure: Secondary | ICD-10-CM | POA: Insufficient documentation

## 2022-08-14 DIAGNOSIS — E669 Obesity, unspecified: Secondary | ICD-10-CM

## 2022-08-14 DIAGNOSIS — E538 Deficiency of other specified B group vitamins: Secondary | ICD-10-CM | POA: Insufficient documentation

## 2022-08-14 DIAGNOSIS — R748 Abnormal levels of other serum enzymes: Secondary | ICD-10-CM | POA: Diagnosis not present

## 2022-08-14 DIAGNOSIS — L0211 Cutaneous abscess of neck: Secondary | ICD-10-CM | POA: Diagnosis not present

## 2022-08-14 DIAGNOSIS — Z794 Long term (current) use of insulin: Secondary | ICD-10-CM

## 2022-08-14 DIAGNOSIS — I5032 Chronic diastolic (congestive) heart failure: Secondary | ICD-10-CM | POA: Insufficient documentation

## 2022-08-14 DIAGNOSIS — E559 Vitamin D deficiency, unspecified: Secondary | ICD-10-CM | POA: Diagnosis not present

## 2022-08-14 DIAGNOSIS — Z6841 Body Mass Index (BMI) 40.0 and over, adult: Secondary | ICD-10-CM

## 2022-08-14 DIAGNOSIS — G4733 Obstructive sleep apnea (adult) (pediatric): Secondary | ICD-10-CM | POA: Insufficient documentation

## 2022-08-14 DIAGNOSIS — E11622 Type 2 diabetes mellitus with other skin ulcer: Secondary | ICD-10-CM | POA: Diagnosis not present

## 2022-08-14 DIAGNOSIS — E1169 Type 2 diabetes mellitus with other specified complication: Secondary | ICD-10-CM

## 2022-08-14 MED ORDER — VITAMIN D (ERGOCALCIFEROL) 1.25 MG (50000 UNIT) PO CAPS: ORAL_CAPSULE | ORAL | 0 refills | Status: DC

## 2022-08-14 MED ORDER — TIRZEPATIDE 2.5 MG/0.5ML ~~LOC~~ SOAJ: 2.5000 mg | SUBCUTANEOUS | 0 refills | Status: DC

## 2022-08-14 MED ORDER — GLUCOSE MANAGEMENT PO TABS: ORAL_TABLET | ORAL | 2 refills | Status: DC

## 2022-08-14 NOTE — Progress Notes (Signed)
Luis [name] ([id number]) [id number] Page 1 of 6 Visit Report for 08/14/2022 Allergy List Details Patient Name: Date of Service: [name], [name] 08/14/2022 1:30 PM Medical Record Number: [id number] Patient Account Number: [id number] Date of Birth/Sex: Treating RN: 05/08/1966 (56 y.o. Luis [name] Primary Care Wilder Kurowski: Jacquiline Doe Other Clinician: Referring Terrill Wauters: Treating Biannca Scantlin/Extender: Wandalee Ferdinand in Treatment: 0 Allergies Active Allergies Demerol morphine Allergy Notes Electronic Signature(s) Signed: 08/14/2022 4:09:44 PM By: Gelene Mink By: Samuella Bruin on 08/14/2022 13:39:14 -------------------------------------------------------------------------------- Arrival Information Details Patient Name: Date of Service: Luis [name]. 08/14/2022 1:30 PM Medical Record Number: [id number] Patient Account Number: [id number] Date of Birth/Sex: Treating RN: 1966-06-05 (56 y.o. Luis [name] Primary Care Donyea Gafford: Jacquiline Doe Other Clinician: Referring Sair Faulcon: Treating Jahmar Mckelvy/Extender: Duanne Guess Rising, Sedonia Small in Treatment: 0 Visit Information Patient Arrived: Ambulatory Arrival Time: 13:31 Accompanied By: sister Transfer Assistance: None Patient Identification Verified: Yes Secondary Verification Process Completed: Yes Patient Requires Transmission-Based Precautions: No Patient Has Alerts: No Electronic Signature(s) Signed: 08/14/2022 4:09:44 PM By: Samuella Bruin Entered By: Samuella Bruin on 08/14/2022 13:37:20 Clinic Level of Care Assessment Details -------------------------------------------------------------------------------- [name] ([id number]) [id number] Page 2 of 6 Patient Name: Date of Service: Luis [name] 08/14/2022 1:30 PM Medical Record Number: [id number] Patient Account Number:  [id number] Date of Birth/Sex: Treating RN: 02-02-1966 (56 y.o. Luis [name] Primary Care Mykal Kirchman: Jacquiline Doe Other Clinician: Referring Lyndell Allaire: Treating Michelle Vanhise/Extender: Wandalee Ferdinand in Treatment: 0 Clinic Level of Care Assessment Items TOOL 2 Quantity Score X- 1 0 Use when only an EandM is performed on the INITIAL visit ASSESSMENTS - Nursing Assessment / Reassessment X- 1 20 General Physical Exam (combine w/ comprehensive assessment (listed just below) when performed on new pt. evals) X- 1 25 Comprehensive Assessment (HX, ROS, Risk Assessments, Wounds Hx, etc.) ASSESSMENTS - Wound and Skin A ssessment / Reassessment X - Simple Wound Assessment / Reassessment - one wound 1 5 []  - 0 Complex Wound Assessment / Reassessment - multiple wounds []  - 0 Dermatologic / Skin Assessment (not related to wound area) ASSESSMENTS - Ostomy and/or Continence Assessment and Care []  - 0 Incontinence Assessment and Management []  - 0 Ostomy Care Assessment and Management (repouching, etc.) PROCESS - Coordination of Care X - Simple Patient / Family Education for ongoing care 1 15 []  - 0 Complex (extensive) Patient / Family Education for ongoing care X- 1 10 Staff obtains , Records, T Results / Process Orders est []  - 0 Staff telephones HHA, Nursing Homes / Clarify orders / etc []  - 0 Routine Transfer to another Facility (non-emergent condition) []  - 0 Routine Hospital Admission (non-emergent condition) X- 1 15 New Admissions / / Ordering NPWT Apligraf, etc. , []  - 0 Emergency Hospital Admission (emergent condition) X- 1 10 Simple Discharge Coordination []  - 0 Complex (extensive) Discharge Coordination PROCESS - Special Needs []  - 0 Pediatric / Minor Patient Management []  - 0 Isolation Patient Management []  - 0 Hearing / Language / Visual special needs []  - 0 Assessment of Community assistance  (transportation, D/C planning, etc.) []  - 0 Additional assistance / Altered mentation []  - 0 Support Surface(s) Assessment (bed, cushion, seat, etc.) INTERVENTIONS - Wound Cleansing / Measurement []  - 0 Wound Imaging (photographs - any number of wounds) []  - 0 Wound Tracing (instead of photographs) []  - 0 Simple Wound Measurement - one wound []  - 0 Complex Wound Measurement - multiple wounds []  - 0  Simple Wound Cleansing - one wound []  - 0 Complex Wound Cleansing - multiple wounds INTERVENTIONS - Wound Dressings []  - 0 Small Wound Dressing one or multiple wounds []  - 0 Medium Wound Dressing one or multiple wounds []  - 0 Large Wound Dressing one or multiple wounds Derderian, Apolinar [name] ([id number]) [id number] Page 3 of 6 []  - 0 Application of Medications - injection INTERVENTIONS - Miscellaneous []  - 0 External ear exam []  - 0 Specimen Collection (cultures, biopsies, blood, body fluids, etc.) []  - 0 Specimen(s) / Culture(s) sent or taken to Lab for analysis []  - 0 Patient Transfer (multiple staff / Harrel Lemon Lift / Similar devices) []  - 0 Simple Staple / Suture removal (25 or less) []  - 0 Complex Staple / Suture removal (26 or more) []  - 0 Hypo / Hyperglycemic Management (close monitor of Blood Glucose) []  - 0 Ankle / Brachial Index (ABI) - do not check if billed separately Has the patient been seen at the hospital within the last three years: Yes Total Score: 100 Level Of Care: New/Established - Level 3 Electronic Signature(s) Signed: 08/14/2022 4:09:44 PM By: Adline Peals Entered By: Adline Peals on 08/14/2022 13:52:24 -------------------------------------------------------------------------------- Encounter Discharge Information Details Patient Name: Date of Service: Luis [name]. 08/14/2022 1:30 PM Medical Record Number: [id number] Patient Account Number: [id number] Date of Birth/Sex: Treating RN: 11-01-1966 (56 y.o. Janyth Contes Primary Care Raigen Jagielski: Dimas Chyle Other Clinician: Referring Jermarion Poffenberger: Treating Jaque Dacy/Extender: Caro Hight in Treatment: 0 Encounter Discharge Information Items Discharge Condition: Stable Ambulatory Status: Ambulatory Discharge Destination: Home Transportation: Private Auto Accompanied By: sister Schedule Follow-up Appointment: Yes Clinical Summary of Care: Patient Declined Electronic Signature(s) Signed: 08/14/2022 4:09:44 PM By: Adline Peals Entered By: Adline Peals on 08/14/2022 13:52:51 -------------------------------------------------------------------------------- Lower Extremity Assessment Details Patient Name: Date of Service: CAESAR, MANNELLA 08/14/2022 1:30 PM Medical Record Number: [id number] Patient Account Number: [id number] Date of Birth/Sex: Treating RN: 1966-08-29 (56 y.o. Janyth Contes Primary Care Lukasz Rogus: Dimas Chyle Other Clinician: Referring Taseen Marasigan: Treating Shalayne Leach/Extender: Caro Hight in Treatment: 0 Electronic Signature(s) Santillanes, Arty Baumgartner ([id number]) [id number] Page 4 of 6 Signed: 08/14/2022 4:09:44 PM By: Sabas Sous By: Adline Peals on 08/14/2022 13:45:55 -------------------------------------------------------------------------------- Multi Wound Chart Details Patient Name: Date of Service: Luis [name]. 08/14/2022 1:30 PM Medical Record Number: [id number] Patient Account Number: [id number] Date of Birth/Sex: Treating RN: 1966-06-04 (56 y.o. M) Primary Care Loreal Schuessler: Dimas Chyle Other Clinician: Referring Illona Bulman: Treating Merrell Borsuk/Extender: Caro Hight in Treatment: 0 Vital Signs Height(in): 75 Capillary Blood Glucose(mg/dl): 129 Weight(lbs): 375 Pulse(bpm): 71 Body Mass Index(BMI): 46.9 Blood Pressure(mmHg): 120/71 Temperature(F):  98.4 Respiratory Rate(breaths/min): 18 [Treatment Notes:Wound Assessments Treatment Notes] Electronic Signature(s) Signed: 08/14/2022 1:54:24 PM By: Fredirick Maudlin MD FACS Entered By: Fredirick Maudlin on 08/14/2022 13:54:24 -------------------------------------------------------------------------------- Multi-Disciplinary Care Plan Details Patient Name: Date of Service: Luis [name]. 08/14/2022 1:30 PM Medical Record Number: [id number] Patient Account Number: [id number] Date of Birth/Sex: Treating RN: 1966-01-30 (56 y.o. Janyth Contes Primary Care Morine Kohlman: Dimas Chyle Other Clinician: Referring Nakota Elsen: Treating Travarus Trudo/Extender: Fredirick Maudlin Rising, Melvenia Needles in Treatment: 0 Active Inactive Electronic Signature(s) Signed: 08/14/2022 4:09:44 PM By: Sabas Sous By: Adline Peals on 08/14/2022 13:51:41 -------------------------------------------------------------------------------- Pain Assessment Details Patient Name: Date of Service: ISSIAH, HUFFAKER 08/14/2022 1:30 PM Medical Record Number: [id number] Patient Account Number: [id number] Date of Birth/Sex: Treating RN: 07/12/1966 (56 y.o. Janyth Contes Primary Care Damaris Geers: Dimas Chyle Other Clinician: Franqui, Aysen  [name] ([id number]) [id number] Page 5 of 6 Referring Almira Phetteplace: Treating Karyss Frese/Extender: Wandalee Ferdinand in Treatment: 0 Active Problems Location of Pain Severity and Description of Pain Patient Has Paino No Site Locations Rate the pain. Current Pain Level: 0 Pain Management and Medication Current Pain Management: Electronic Signature(s) Signed: 08/14/2022 4:09:44 PM By: Samuella Bruin Entered By: Samuella Bruin on 08/14/2022 13:46:07 -------------------------------------------------------------------------------- Patient/Caregiver Education Details Patient Name: Date of Service: Luis [name] 10/12/2023andnbsp1:30 PM Medical Record Number: [id number] Patient Account Number: [id number] Date of Birth/Gender: Treating RN: 09-11-1966 (56 y.o. Luis [name] Primary Care Physician: Jacquiline Doe Other Clinician: Referring Physician: Treating Physician/Extender: Duanne Guess Rising, Sedonia Small in Treatment: 0 Education Assessment Education Provided To: Patient Education Topics Provided Wound/Skin Impairment: Methods: Explain/Verbal Responses: Reinforcements needed, State content correctly Electronic Signature(s) Signed: 08/14/2022 4:09:44 PM By: Samuella Bruin Entered By: Samuella Bruin on 08/14/2022 13:51:49 Hepp, Jonothan [name] ([id number]) [id number] Page 6 of 6 -------------------------------------------------------------------------------- Vitals Details Patient Name: Date of Service: DEEJAY, KOPPELMAN 08/14/2022 1:30 PM Medical Record Number: [id number] Patient Account Number: [id number] Date of Birth/Sex: Treating RN: 1966-02-26 (55 y.o. Luis [name] Primary Care Deldrick Linch: Jacquiline Doe Other Clinician: Referring Kealan Buchan: Treating Tequisha Maahs/Extender: Wandalee Ferdinand in Treatment: 0 Vital Signs Time Taken: 13:37 Temperature (F): 98.4 Height (in): 75 Pulse (bpm): 71 Source: Stated Respiratory Rate (breaths/min): 18 Weight (lbs): 375 Blood Pressure (mmHg): 120/71 Source: Stated Capillary Blood Glucose (mg/dl): 127 Body Mass Index (BMI): 46.9 Reference Range: 80 - 120 mg / dl Electronic Signature(s) Signed: 08/14/2022 4:09:44 PM By: Samuella Bruin Entered By: Samuella Bruin on 08/14/2022 13:38:22

## 2022-08-14 NOTE — Progress Notes (Signed)
Sloma, Fern L ([id number]) [phone] [id number] Page 1 of 4 Visit Report for 08/14/2022 Abuse Risk Screen Details Patient Name: Date of Service: Luis [name], Luis [name] 08/14/2022 1:30 PM Medical Record Number: [id number] Patient Account Number: [id number] Date of Birth/Sex: Treating RN: [birth year] (56 y.o. Janyth Contes Primary Care Sharlisa Hollifield: Dimas Chyle Other Clinician: Referring Allante Whitmire: Treating Jamez Ambrocio/Extender: Caro Hight in Treatment: 0 Abuse Risk Screen Items Answer ABUSE RISK SCREEN: Has anyone close to you tried to hurt or harm you recentlyo No Do you feel uncomfortable with anyone in your familyo No Has anyone forced you do things that you didnt want to doo No Electronic Signature(s) Signed: 08/14/2022 4:09:44 PM By: Adline Peals Entered By: Adline Peals on 08/14/2022 13:44:50 -------------------------------------------------------------------------------- Activities of Daily Living Details Patient Name: Date of Service: Luis [name], Luis [name] 08/14/2022 1:30 PM Medical Record Number: [id number] Patient Account Number: [id number] Date of Birth/Sex: Treating RN: Jun 22, 1966 (56 y.o. Janyth Contes Primary Care Fannie Gathright: Dimas Chyle Other Clinician: Referring Annalei Friesz: Treating Emil Weigold/Extender: Caro Hight in Treatment: 0 Activities of Daily Living Items Answer Activities of Daily Living (Please select one for each item) Drive Automobile Not Able T Medications ake Completely Able Use T elephone Completely Able Care for Appearance Completely Able Use T oilet Completely Able Bath / Shower Completely Able Dress Self Completely Able Feed Self Completely Able Walk Completely Able Get In / Out Bed Completely Able Housework Completely Able Prepare Meals Completely Able Handle Money Completely Able Shop for Self Completely Able Electronic Signature(s) Signed:  08/14/2022 4:09:44 PM By: Adline Peals Entered By: Adline Peals on 08/14/2022 13:45:05 Trimpe, Arty Baumgartner ([id number]) [id number] Page 2 of 4 -------------------------------------------------------------------------------- Education Screening Details Patient Name: Date of Service: Luis [name], Luis [name] 08/14/2022 1:30 PM Medical Record Number: [id number] Patient Account Number: [id number] Date of Birth/Sex: Treating RN: 05/02/1966 (56 y.o. Janyth Contes Primary Care Keishon Chavarin: Dimas Chyle Other Clinician: Referring Zeev Deakins: Treating Fordyce Lepak/Extender: Fredirick Maudlin Rising, Melvenia Needles in Treatment: 0 Primary Learner Assessed: Patient Learning Preferences/Education Level/Primary Language Learning Preference: Explanation, Demonstration, Video, Printed Material Highest Education Level: College or Above Preferred Language: English Cognitive Barrier Language Barrier: No Translator Needed: No Memory Deficit: No Emotional Barrier: No Cultural/Religious Beliefs Affecting Medical Care: No Physical Barrier Impaired Vision: No Impaired Hearing: No Decreased Hand dexterity: No Knowledge/Comprehension Knowledge Level: Medium Comprehension Level: Medium Ability to understand written instructions: Medium Ability to understand verbal instructions: Medium Motivation Anxiety Level: Calm Cooperation: Cooperative Education Importance: Acknowledges Need Interest in Health Problems: Asks Questions Perception: Coherent Willingness to Engage in Self-Management Medium Activities: Readiness to Engage in Self-Management Medium Activities: Electronic Signature(s) Signed: 08/14/2022 4:09:44 PM By: Adline Peals Entered By: Adline Peals on 08/14/2022 13:45:27 -------------------------------------------------------------------------------- Fall Risk Assessment Details Patient Name: Date of Service: Luis Crimes. 08/14/2022  1:30 PM Medical Record Number: [id number] Patient Account Number: [id number] Date of Birth/Sex: Treating RN: [birth year] (56 y.o. Janyth Contes Primary Care Ridley Schewe: Dimas Chyle Other Clinician: Referring Symantha Steeber: Treating Yaneisy Wenz/Extender: Caro Hight in Treatment: 0 Fall Risk Assessment Items Have you had 2 or more falls in the last 12 monthso 0 No Schmieg, Naphtali L ([id number]) [id number] Page 3 of 4 Have you had any fall that resulted in injury in the last 12 monthso 0 No FALLS RISK SCREEN History of falling - immediate or within 3 months 0 No Secondary diagnosis (Do you have 2 or more medical diagnoseso) 15 Yes Ambulatory aid None/bed rest/wheelchair/nurse 0 Yes Crutches/cane/walker  0 No Furniture 0 No Intravenous therapy Access/Saline/Heparin Lock 0 No Gait/Transferring Normal/ bed rest/ wheelchair 0 Yes Weak (short steps with or without shuffle, stooped but able to lift head while walking, may seek 0 No support from furniture) Impaired (short steps with shuffle, may have difficulty arising from chair, head down, impaired 0 No balance) Mental Status Oriented to own ability 0 Yes Electronic Signature(s) Signed: 08/14/2022 4:09:44 PM By: Samuella Bruin Entered By: Samuella Bruin on 08/14/2022 13:45:39 -------------------------------------------------------------------------------- Foot Assessment Details Patient Name: Date of Service: Luis Barges. 08/14/2022 1:30 PM Medical Record Number: [id number] Patient Account Number: [id number] Date of Birth/Sex: Treating RN: [birth year] (56 y.o. Marlan Palau Primary Care Deronte Solis: Jacquiline Doe Other Clinician: Referring Lorriann Hansmann: Treating Nyeisha Goodall/Extender: Wandalee Ferdinand in Treatment: 0 Foot Assessment Items Site Locations + = Sensation present, - = Sensation absent, C = Callus, U = Ulcer R = Redness, W =  Warmth, M = Maceration, PU = Pre-ulcerative lesion F = Fissure, S = Swelling, D = Dryness Assessment Right: Left: Other Deformity: No No Prior Foot Ulcer: No No Prior Amputation: No No Charcot Joint: No No Ambulatory Status: Gait: Wassink, Tadashi L ([id number]) [id number] Page 4 of 4 Electronic Signature(s) Signed: 08/14/2022 4:09:44 PM By: Gelene Mink By: Samuella Bruin on 08/14/2022 13:45:51 -------------------------------------------------------------------------------- Nutrition Risk Screening Details Patient Name: Date of Service: Luis [name], Luis [name] 08/14/2022 1:30 PM Medical Record Number: [id number] Patient Account Number: [id number] Date of Birth/Sex: Treating RN: 1965/12/03 (56 y.o. Marlan Palau Primary Care Denys Labree: Jacquiline Doe Other Clinician: Referring Estelle Skibicki: Treating Brownie Nehme/Extender: Wandalee Ferdinand in Treatment: 0 Height (in): 75 Weight (lbs): 375 Body Mass Index (BMI): 46.9 Nutrition Risk Screening Items Score Screening NUTRITION RISK SCREEN: I have an illness or condition that made me change the kind and/or amount of food I eat 0 No I eat fewer than two meals per day 0 No I eat few fruits and vegetables, or milk products 0 No I have three or more drinks of beer, liquor or wine almost every day 0 No I have tooth or mouth problems that make it hard for me to eat 0 No I don't always have enough money to buy the food I need 0 No I eat alone most of the time 0 No I take three or more different prescribed or over-the-counter drugs a day 0 No Without wanting to, I have lost or gained 10 pounds in the last six months 0 No I am not always physically able to shop, cook and/or feed myself 0 No Nutrition Protocols Good Risk Protocol 0 No interventions needed Moderate Risk Protocol High Risk Proctocol Risk Level: Good Risk Score: 0 Electronic Signature(s) Signed: 08/14/2022  4:09:44 PM By: Samuella Bruin Entered By: Samuella Bruin on 08/14/2022 13:45:47

## 2022-08-14 NOTE — Progress Notes (Signed)
Luis [name], Luis [name] ([id number]) [id number] Page 1 of 7 Visit Report for 08/14/2022 Chief Complaint Document Details Patient Name: Date of Service: Luis [name], Luis [name] 08/14/2022 1:30 PM Medical Record Number: [id number] Patient Account Number: [id number] Date of Birth/Sex: Treating RN: 06-Aug-1966 (56 y.o. M) Primary Care Provider: Jacquiline Doe Other Clinician: Referring Provider: Treating Provider/Extender: Wandalee Ferdinand in Treatment: 0 Information Obtained from: Patient Chief Complaint Patient presents to the wound care center due with non-wound condition(s) Electronic Signature(s) Signed: 08/14/2022 1:54:37 PM By: Duanne Guess MD FACS Entered By: Duanne Guess on 08/14/2022 13:54:37 -------------------------------------------------------------------------------- HPI Details Patient Name: Date of Service: Luis Barges. 08/14/2022 1:30 PM Medical Record Number: [id number] Patient Account Number: [id number] Date of Birth/Sex: Treating RN: 16-Nov-1965 (56 y.o. M) Primary Care Provider: Jacquiline Doe Other Clinician: Referring Provider: Treating Provider/Extender: Wandalee Ferdinand in Treatment: 0 History of Present Illness HPI Description: CONSULTATION ONLY 08/14/2022 This is a 56 year old man with CHF, diabetes, hypertension, obstructive sleep apnea, and morbid obesity. He apparently had a scrape on the back of his hand that was not healing well. He was seen in the urgent care center for this, along with an abscess on the back of his neck and pneumonia. The abscess was drained and that site has closed. The scrape on the back of his hand has also healed. He is taking doxycycline for pneumonia and the abscess. Electronic Signature(s) Signed: 08/14/2022 1:57:20 PM By: Duanne Guess MD FACS Entered By: Duanne Guess on 08/14/2022  13:57:19 -------------------------------------------------------------------------------- Physical Exam Details Patient Name: Date of Service: Luis [name], Luis [name] 08/14/2022 1:30 PM Medical Record Number: [id number] Patient Account Number: [id number] Date of Birth/Sex: Treating RN: 27-Dec-1965 (56 y.o. M) Primary Care Provider: Jacquiline Doe Other Clinician: Referring Provider: Treating Provider/Extender: Adebayo, [place] ([id number]) [id number] Page 2 of 7 Weeks in Treatment: 0 Constitutional . . . . No acute distress. Respiratory Normal work of breathing on room air.. Notes 08/14/2022: He has no wounds. Electronic Signature(s) Signed: 08/14/2022 1:57:50 PM By: Duanne Guess MD FACS Entered By: Duanne Guess on 08/14/2022 13:57:50 -------------------------------------------------------------------------------- Physician Orders Details Patient Name: Date of Service: Luis Barges. 08/14/2022 1:30 PM Medical Record Number: [id number] Patient Account Number: [id number] Date of Birth/Sex: Treating RN: 06/04/1966 (56 y.o. Luis [name] Primary Care Provider: Jacquiline Doe Other Clinician: Referring Provider: Treating Provider/Extender: Wandalee Ferdinand in Treatment: 0 Verbal / Phone Orders: No Diagnosis Coding ICD-10 Coding Code Description I50.32 Chronic diastolic (congestive) heart failure E11.622 Type 2 diabetes mellitus with other skin ulcer E66.01 Morbid (severe) obesity due to excess calories R60.0 Localized edema I10 Essential (primary) hypertension Discharge From [facility] Services Discharge from Wound Care Center Anesthetic (In clinic) Topical Lidocaine 5% applied to wound bed Patient Medications llergies: Demerol, morphine A Notifications Medication Indication Start End 08/14/2022 lidocaine DOSE topical 5 % ointment - ointment topical Electronic  Signature(s) Signed: 08/14/2022 1:58:27 PM By: Duanne Guess MD FACS Entered By: Duanne Guess on 08/14/2022 13:58:27 Problem List Details -------------------------------------------------------------------------------- Luis [name] ([id number]) [id number] Page 3 of 7 Patient Name: Date of Service: Luis [name] 08/14/2022 1:30 PM Medical Record Number: [id number] Patient Account Number: [id number] Date of Birth/Sex: Treating RN: 12/10/1965 (56 y.o. M) Primary Care Provider: Jacquiline Doe Other Clinician: Referring Provider: Treating Provider/Extender: Wandalee Ferdinand in Treatment: 0 Active Problems ICD-10 Encounter Code Description Active Date MDM Diagnosis I50.32 Chronic diastolic (congestive) heart failure 08/14/2022 No Yes E11.622 Type 2 diabetes  mellitus with other skin ulcer 08/14/2022 No Yes E66.01 Morbid (severe) obesity due to excess calories 08/14/2022 No Yes R60.0 Localized edema 08/14/2022 No Yes I10 Essential (primary) hypertension 08/14/2022 No Yes Inactive Problems Resolved Problems Electronic Signature(s) Signed: 08/14/2022 1:53:25 PM By: Duanne Guess MD FACS Entered By: Duanne Guess on 08/14/2022 13:53:25 -------------------------------------------------------------------------------- Progress Note Details Patient Name: Date of Service: Luis Barges. 08/14/2022 1:30 PM Medical Record Number: [id number] Patient Account Number: [id number] Date of Birth/Sex: Treating RN: 02/18/1966 (56 y.o. M) Primary Care Provider: Jacquiline Doe Other Clinician: Referring Provider: Treating Provider/Extender: Wandalee Ferdinand in Treatment: 0 Subjective Chief Complaint Information obtained from Patient Patient presents to the wound care center due with non-wound condition(s) History of Present Illness (HPI) CONSULTATION ONLY 08/14/2022 This is a 56 year old man with CHF,  diabetes, hypertension, obstructive sleep apnea, and morbid obesity. He apparently had a scrape on the back of his hand that was not healing well. He was seen in the urgent care center for this, along with an abscess on the back of his neck and pneumonia. The abscess was drained and that site has closed. The scrape on the back of his hand has also healed. He is taking doxycycline for pneumonia and the abscess. Patient History Information obtained from Patient. [place], Treasure [name] ([id number]) [id number] Page 4 of 7 Allergies Demerol, morphine Family History Hypertension - Maternal Grandparents, No family history of Cancer, Diabetes, Heart Disease, Hereditary Spherocytosis, Kidney Disease, Lung Disease, Seizures, Stroke, Thyroid Problems, Tuberculosis. Social History Former smoker - quit for 1 week, Marital Status - Single, Alcohol Use - Moderate, Drug Use - No History, Caffeine Use - Rarely. Medical History Cardiovascular Patient has history of Hypertension Endocrine Patient has history of Type II Diabetes Patient is treated with Insulin. Blood sugar is tested. Hospitalization/Surgery History - cardiac catheterization. - femur im nail. - fracture surgery. Medical A Surgical History Notes nd Constitutional Symptoms (General Health) obese Review of Systems (ROS) Constitutional Symptoms (General Health) Denies complaints or symptoms of Fatigue, Fever, Chills, Marked Weight Change. Eyes Denies complaints or symptoms of Dry Eyes, Vision Changes, Glasses / Contacts. Ear/Nose/Mouth/Throat Denies complaints or symptoms of Chronic sinus problems or rhinitis. Respiratory Complains or has symptoms of Chronic or frequent coughs, Shortness of Breath. Cardiovascular Denies complaints or symptoms of Chest pain. Gastrointestinal Denies complaints or symptoms of Frequent diarrhea, Nausea, Vomiting. Genitourinary Complains or has symptoms of Frequent  urination. Integumentary (Skin) Denies complaints or symptoms of Wounds. Musculoskeletal Denies complaints or symptoms of Muscle Pain, Muscle Weakness. Neurologic Denies complaints or symptoms of Numbness/parasthesias. Psychiatric Denies complaints or symptoms of Claustrophobia. Objective Constitutional No acute distress. Vitals Time Taken: 1:37 PM, Height: 75 in, Source: Stated, Weight: 375 lbs, Source: Stated, BMI: 46.9, Temperature: 98.4 F, Pulse: 71 bpm, Respiratory Rate: 18 breaths/min, Blood Pressure: 120/71 mmHg, Capillary Blood Glucose: 129 mg/dl. Respiratory Normal work of breathing on room air.. General Notes: 08/14/2022: He has no wounds. Assessment Active Problems ICD-10 Chronic diastolic (congestive) heart failure Type 2 diabetes mellitus with other skin ulcer Morbid (severe) obesity due to excess calories Localized edema Essential (primary) hypertension Luis [name], Luis [name] ([id number]) [id number] Page 5 of 7 Plan Discharge From [facility] Services: Discharge from Wound Care Center Anesthetic: (In clinic) Topical Lidocaine 5% applied to wound bed The following medication(s) was prescribed: lidocaine topical 5 % ointment ointment topical was prescribed at facility 08/14/2022: As he has no wounds, he is not in any need of specialized wound care. The skin on the back of his  hand was quite dry, so I recommended using a good topical moisturizer. Based on his history and description, as well as what I read in the electronic medical record, the abscess on the back of his neck may actually be a sebaceous cyst. I told him that if it were to recur, I would be happy to see him in clinic for proper excision to remove the capsule. For now, he will follow-up on an as-needed basis. Electronic Signature(s) Signed: 08/14/2022 1:59:36 PM By: Fredirick Maudlin MD FACS Entered By: Fredirick Maudlin on 08/14/2022  13:59:36 -------------------------------------------------------------------------------- HxROS Details Patient Name: Date of Service: Luis Crimes. 08/14/2022 1:30 PM Medical Record Number: [id number] Patient Account Number: [id number] Date of Birth/Sex: Treating RN: January 04, 1966 (56 y.o. Janyth Contes Primary Care Provider: Dimas Chyle Other Clinician: Referring Provider: Treating Provider/Extender: Caro Hight in Treatment: 0 Information Obtained From Patient Constitutional Symptoms (General Health) Complaints and Symptoms: Negative for: Fatigue; Fever; Chills; Marked Weight Change Medical History: Past Medical History Notes: obese Eyes Complaints and Symptoms: Negative for: Dry Eyes; Vision Changes; Glasses / Contacts Ear/Nose/Mouth/Throat Complaints and Symptoms: Negative for: Chronic sinus problems or rhinitis Respiratory Complaints and Symptoms: Positive for: Chronic or frequent coughs; Shortness of Breath Cardiovascular Complaints and Symptoms: Negative for: Chest pain Medical History: Positive for: Hypertension Gastrointestinal Complaints and Symptoms: Negative for: Frequent diarrhea; Nausea; Vomiting Luis [name], Luis [name] ([id number]) [id number] Page 6 of 7 Genitourinary Complaints and Symptoms: Positive for: Frequent urination Integumentary (Skin) Complaints and Symptoms: Negative for: Wounds Musculoskeletal Complaints and Symptoms: Negative for: Muscle Pain; Muscle Weakness Neurologic Complaints and Symptoms: Negative for: Numbness/parasthesias Psychiatric Complaints and Symptoms: Negative for: Claustrophobia Hematologic/Lymphatic Endocrine Medical History: Positive for: Type II Diabetes Time with diabetes: unknown Treated with: Insulin Blood sugar tested every day: Yes Tested : once Immunological Oncologic Immunizations Pneumococcal Vaccine: Received Pneumococcal Vaccination:  No Implantable Devices None Hospitalization / Surgery History Type of Hospitalization/Surgery cardiac catheterization femur im nail fracture surgery Family and Social History Cancer: No; Diabetes: No; Heart Disease: No; Hereditary Spherocytosis: No; Hypertension: Yes - Maternal Grandparents; Kidney Disease: No; Lung Disease: No; Seizures: No; Stroke: No; Thyroid Problems: No; Tuberculosis: No; Former smoker - quit for 1 week; Marital Status - Single; Alcohol Use: Moderate; Drug Use: No History; Caffeine Use: Rarely; Financial Concerns: No; Food, Clothing or Shelter Needs: No; Support System Lacking: No; Transportation Concerns: No Electronic Signature(s) Signed: 08/14/2022 3:03:51 PM By: Fredirick Maudlin MD FACS Signed: 08/14/2022 4:09:44 PM By: Adline Peals Entered By: Adline Peals on 08/14/2022 13:44:23 Luis [name], Luis [name] ([id number]) [id number] Page 7 of 7 -------------------------------------------------------------------------------- SuperBill Details Patient Name: Date of Service: Luis, [name] 08/14/2022 Medical Record Number: [id number] Patient Account Number: [id number] Date of Birth/Sex: Treating RN: [birth year] (56 y.o. Janyth Contes Primary Care Provider: Dimas Chyle Other Clinician: Referring Provider: Treating Provider/Extender: Caro Hight in Treatment: 0 Diagnosis Coding ICD-10 Codes Code Description E26.83 Chronic diastolic (congestive) heart failure E11.622 Type 2 diabetes mellitus with other skin ulcer E66.01 Morbid (severe) obesity due to excess calories R60.0 Localized edema I10 Essential (primary) hypertension Facility Procedures : CPT4 Code: [id number] Description: 99213 - WOUND CARE VISIT-LEV 3 EST PT Modifier: Quantity: 1 Physician Procedures : CPT4 Code Description Modifier [id number] WC PHYS LEVEL 3 NEW PT ICD-10 Diagnosis Description H41.74 Chronic diastolic (congestive)  heart failure E11.622 Type 2 diabetes mellitus with other skin ulcer E66.01 Morbid (severe) obesity due to excess calories  R60.0 Localized edema Quantity: 1 Electronic Signature(s) Signed: 08/14/2022 2:02:04 PM By:  Duanne Guess MD FACS Entered By: Duanne Guess on 08/14/2022 14:02:04

## 2022-08-17 ENCOUNTER — Other Ambulatory Visit: Payer: Self-pay | Admitting: Family Medicine

## 2022-08-17 DIAGNOSIS — E876 Hypokalemia: Secondary | ICD-10-CM

## 2022-08-21 ENCOUNTER — Encounter: Payer: Self-pay | Admitting: Family Medicine

## 2022-08-21 ENCOUNTER — Ambulatory Visit (INDEPENDENT_AMBULATORY_CARE_PROVIDER_SITE_OTHER): Payer: BC Managed Care – PPO | Admitting: Family Medicine

## 2022-08-21 ENCOUNTER — Ambulatory Visit: Payer: BC Managed Care – PPO | Admitting: Family Medicine

## 2022-08-21 VITALS — BP 89/55 | HR 90 | Temp 97.5°F | Ht 75.0 in | Wt 379.6 lb

## 2022-08-21 DIAGNOSIS — I152 Hypertension secondary to endocrine disorders: Secondary | ICD-10-CM

## 2022-08-21 DIAGNOSIS — Z1211 Encounter for screening for malignant neoplasm of colon: Secondary | ICD-10-CM | POA: Diagnosis not present

## 2022-08-21 DIAGNOSIS — E1159 Type 2 diabetes mellitus with other circulatory complications: Secondary | ICD-10-CM | POA: Diagnosis not present

## 2022-08-21 DIAGNOSIS — Z23 Encounter for immunization: Secondary | ICD-10-CM | POA: Diagnosis not present

## 2022-08-21 DIAGNOSIS — Z794 Long term (current) use of insulin: Secondary | ICD-10-CM

## 2022-08-21 DIAGNOSIS — E1165 Type 2 diabetes mellitus with hyperglycemia: Secondary | ICD-10-CM | POA: Diagnosis not present

## 2022-08-21 NOTE — Progress Notes (Signed)
   Luis [name] is a 56 y.o. male who presents today for an office visit.  Assessment/Plan:  Chronic Problems Addressed Today: Hypertension associated with diabetes ([place]) Blood pressure a bit on the low side today though does not have any symptoms.  He has typically been well controlled.  Continue current regimen Coreg 25 mg twice daily and spironolactone 25 mg daily.  We discussed reasons to return to care.  Diabetes ([place]) Recently had A1c checked which was recently well controlled at 7.2.  He has been following with the weight management clinic and they have recently prescribed him [facility] however he has not yet started this.  He is no longer taking Jardiance due to cost concerns.  His sugars have been well controlled at home typically in the low 100s on regimen of insulin 70/30 24 units twice daily.  With him starting GLP-1 agonist as above hopefully will be able to wean down on his insulin.  Advised him to decrease his insulin by 1 unit every morning but his fasting sugar is less than 125.  He will follow-up with me in a few weeks via MyChart.  Come back for A1c recheck in 3 to 6 months.  Flu shot given today.    Subjective:  HPI:  See A/p for status of chronic conditions.         Objective:  Physical Exam: BP (!) 89/55   Pulse 90   Temp (!) 97.5 F (36.4 C) (Temporal)   Ht 6\' 3"  (1.905 m)   Wt (!) 379 lb 9.6 oz (172.2 kg)   SpO2 97%   BMI 47.45 kg/m   Wt Readings from Last 3 Encounters:  08/21/22 (!) 379 lb 9.6 oz (172.2 kg)  08/14/22 (!) 375 lb (170.1 kg)  08/07/22 (!) 384 lb (174.2 kg)    Gen: No acute distress, resting comfortably CV: Regular rate and rhythm with no murmurs appreciated Pulm: Normal work of breathing, clear to auscultation bilaterally with no crackles, wheezes, or rhonchi Neuro: Grossly normal, moves all extremities Psych: Normal affect and thought content      Galen Malkowski M. [name], MD 08/21/2022 10:48 AM

## 2022-08-21 NOTE — Assessment & Plan Note (Signed)
Blood pressure a bit on the low side today though does not have any symptoms.  He has typically been well controlled.  Continue current regimen Coreg 25 mg twice daily and spironolactone 25 mg daily.  We discussed reasons to return to care.

## 2022-08-21 NOTE — Assessment & Plan Note (Signed)
Recently had A1c checked which was recently well controlled at 7.2.  He has been following with the weight management clinic and they have recently prescribed him [facility] however he has not yet started this.  He is no longer taking Jardiance due to cost concerns.  His sugars have been well controlled at home typically in the low 100s on regimen of insulin 70/30 24 units twice daily.  With him starting GLP-1 agonist as above hopefully will be able to wean down on his insulin.  Advised him to decrease his insulin by 1 unit every morning but his fasting sugar is less than 125.  He will follow-up with me in a few weeks via MyChart.  Come back for A1c recheck in 3 to 6 months.

## 2022-08-21 NOTE — Patient Instructions (Signed)
It was very nice to see you today!  We will give your flu shot today.  I think you will do great with the Mounjaro.  Please decrease your insulin by 1 unit every morning that your blood sugar is less than 125.  Please follow-up with me in a few weeks to let me know how your sugars are looking.  We will see back in 3 to 6 months.  Come back to see [place] sooner if needed.  Take care, Dr [name]  PLEASE NOTE:  If you had any lab tests please let us know if you have not heard back within a few days. You may see your results on mychart before we have a chance to review them but we will give you a call once they are reviewed by [place]. If we ordered any referrals today, please let us know if you have not heard from their office within the next week.   Please try these tips to maintain a healthy lifestyle:  Eat at least 3 REAL meals and 1-2 snacks per day.  Aim for no more than 5 hours between eating.  If you eat breakfast, please do so within one hour of getting up.   Each meal should contain half fruits/vegetables, one quarter protein, and one quarter carbs (no bigger than a computer mouse)  Cut down on sweet beverages. This includes juice, soda, and sweet tea.   Drink at least 1 glass of water with each meal and aim for at least 8 glasses per day  Exercise at least 150 minutes every week.

## 2022-08-21 NOTE — Progress Notes (Signed)
Chief Complaint:   OBESITY Luis [name] is here to discuss his progress with his obesity treatment plan along with follow-up of his obesity related diagnoses. Luis [name] is on the Category 4 Plan and states he is following his eating plan approximately 100% of the time. Luis [name] states he is walking more daily 20 minutes 1 times per week.  Today's visit was #: 2 Starting weight: 383 lbs Starting date: 07/31/2022 Today's weight: 375 lbs Today's date: 08/14/2022 Total lbs lost to date: 8 lbs Total lbs lost since last in-office visit: 8 lbs  Interim History: Filling up with all meal plan foods.  Likes the foods on the list.  Keeps snacks out of the house.  Has a good support system.  Eats veggies and fruit.  He is on antibiotics for pneumonia and an abscess on the back of his neck.  Blood sugars 129 in the AM fasting.  Plans to purchase healthy snacks on meal plan this week.    Subjective:   1. Type 2 diabetes mellitus with other specified complication, unspecified whether long term insulin use ([place]) Discussed labs with patient today. He is on Novolog 24 units BID.  Denies hypoglycemia.  He is off Jardiance.  Last A1c was 7.2.  2. CHF NYHA class I, chronic, diastolic (HCC) LVEF 95-63% in 2018 on echocardiogram.  Moderate LV dysfunction, global hypokinesis with severely elevated LVEDP on left heart cath 2017.    3. Vitamin D deficiency New diagnosis.  Discussed labs with patient today. Vitamin D level severely low at 9.4. He complains of fatigue.   4. Elevated alkaline phosphatase level New diagnosis. Discussed labs with patient today. ALK Phosphatase elevated;  156 with normal AST/ALT.  History of fatty liver seen on ultrasound 03/2017.  5. B12 deficiency New diagnosis.  Discussed labs with patient today. Last B-12 level low normal at 286.  Complains of fatigue, denies a vegan diet.   Assessment/Plan:   1. Type 2 diabetes mellitus with other specified complication, unspecified  whether long term insulin use ([place]) Patient denies a personal or family history of pancreatitis, medullary thyroid carcinoma or multiple endocrine neoplasia type II. Recommend reviewing pen training video online. Be watchful for low sugars.   Begin - tirzepatide [facility]) 2.5 MG/0.5ML Pen; Inject 2.5 mg into the skin once a week.  Dispense: 2 mL; Refill: 0  Begin - Nutritional Supplements (GLUCOSE MANAGEMENT) TABS; Use as needed for low blood sugar  Dispense: 30 tablet; Refill: 2  2. CHF NYHA class I, chronic, diastolic (HCC) Continue Spironolactone, Coreg, Demadex, per cardiology, work on weight loss.   3. Vitamin D deficiency Recheck levels in 2-3 months.  Begin - Vitamin D, Ergocalciferol, (DRISDOL) 1.25 MG (50000 UNIT) CAPS capsule; Take 1 capsule twice a week  Dispense: 10 capsule; Refill: 0  4. Elevated alkaline phosphatase level Reduce ETOH intake, continue plan for weight loss.  Recheck hepatic function panel in 6 months.   5. B12 deficiency Begin OTC Vitamin B-12, 500 mcg daily.  Recheck level in 2-3 months.   6. Obesity,current BMI 46.9 1) Commended patient on his dietary improvements. 2)  Reviewed Bioimpedance results.    Luis [name] is currently in the action stage of change. As such, his goal is to continue with weight loss efforts. He has agreed to the Category 4 Plan.   Exercise goals: All adults should avoid inactivity. Some physical activity is better than none, and adults who participate in any amount of physical activity gain some health benefits.  Behavioral  modification strategies: increasing lean protein intake, decreasing simple carbohydrates, increasing vegetables, increasing water intake, decreasing sodium intake, increasing high fiber foods, decreasing eating out, no skipping meals, meal planning and cooking strategies, better snacking choices, avoiding temptations, planning for success, and decreasing junk food.  Luis [name] has agreed to follow-up with our clinic  in 2 weeks. He was informed of the importance of frequent follow-up visits to maximize his success with intensive lifestyle modifications for his multiple health conditions.   Objective:   Blood pressure 119/70, pulse 79, temperature (!) 97.5 F (36.4 C), height $RemoveBe'6\' 3"'xWctlhwwW$  (1.905 m), weight (!) 375 lb (170.1 kg), SpO2 95 %. Body mass index is 46.87 kg/m.  General: Cooperative, alert, well developed, in no acute distress. HEENT: Conjunctivae and lids unremarkable. Cardiovascular: Regular rhythm.  Lungs: Normal work of breathing. Neurologic: No focal deficits.   Lab Results  Component Value Date   CREATININE 1.18 08/07/2022   BUN 18 08/07/2022   NA 137 08/07/2022   K 3.9 08/07/2022   CL 104 08/07/2022   CO2 23 08/07/2022   Lab Results  Component Value Date   ALT 13 07/31/2022   AST 15 07/31/2022   ALKPHOS 156 (H) 07/31/2022   BILITOT 0.4 07/31/2022   Lab Results  Component Value Date   HGBA1C 7.2 (H) 07/31/2022   HGBA1C 6.8 (A) 04/03/2022   HGBA1C 7.4 (H) 10/03/2021   HGBA1C 6.8 (A) 02/21/2021   HGBA1C 6.0 (A) 08/23/2020   Lab Results  Component Value Date   INSULIN 36.2 (H) 07/31/2022   Lab Results  Component Value Date   TSH 1.560 07/31/2022   Lab Results  Component Value Date   CHOL 145 07/31/2022   HDL 32 (L) 07/31/2022   LDLCALC 94 07/31/2022   TRIG 103 07/31/2022   CHOLHDL 4.5 07/31/2022   Lab Results  Component Value Date   VD25OH 9.4 (L) 07/31/2022   Lab Results  Component Value Date   WBC 12.1 (H) 08/07/2022   HGB 14.3 08/07/2022   HCT 42.5 08/07/2022   MCV 97.9 08/07/2022   PLT 255 08/07/2022   No results found for: "IRON", "TIBC", "FERRITIN"  Attestation Statements:   Reviewed by clinician on day of visit: allergies, medications, problem list, medical history, surgical history, family history, social history, and previous encounter notes.  Time spent on visit including pre-visit chart review and post-visit care and charting was 40 minutes.    I, Davy Pique, am acting as Location manager for Loyal Gambler, DO.  I have reviewed the above documentation for accuracy and completeness, and I agree with the above. Dell Ponto, DO

## 2022-08-27 ENCOUNTER — Encounter: Payer: Self-pay | Admitting: Family Medicine

## 2022-08-28 ENCOUNTER — Encounter (INDEPENDENT_AMBULATORY_CARE_PROVIDER_SITE_OTHER): Payer: Self-pay | Admitting: Internal Medicine

## 2022-08-28 ENCOUNTER — Ambulatory Visit (INDEPENDENT_AMBULATORY_CARE_PROVIDER_SITE_OTHER): Payer: BC Managed Care – PPO | Admitting: Internal Medicine

## 2022-08-28 VITALS — BP 118/75 | HR 75 | Temp 98.1°F | Ht 75.0 in | Wt 377.6 lb

## 2022-08-28 DIAGNOSIS — Z7985 Long-term (current) use of injectable non-insulin antidiabetic drugs: Secondary | ICD-10-CM | POA: Diagnosis not present

## 2022-08-28 DIAGNOSIS — E669 Obesity, unspecified: Secondary | ICD-10-CM | POA: Diagnosis not present

## 2022-08-28 DIAGNOSIS — E1169 Type 2 diabetes mellitus with other specified complication: Secondary | ICD-10-CM | POA: Diagnosis not present

## 2022-08-28 DIAGNOSIS — Z6841 Body Mass Index (BMI) 40.0 and over, adult: Secondary | ICD-10-CM | POA: Diagnosis not present

## 2022-08-29 NOTE — Telephone Encounter (Signed)
Yes that is ok to push back his appointment.  Algis Greenhouse. [name], MD 08/29/2022 12:49 PM

## 2022-09-05 ENCOUNTER — Encounter: Payer: Self-pay | Admitting: Podiatry

## 2022-09-05 ENCOUNTER — Ambulatory Visit (INDEPENDENT_AMBULATORY_CARE_PROVIDER_SITE_OTHER): Payer: BC Managed Care – PPO | Admitting: Podiatry

## 2022-09-05 DIAGNOSIS — R609 Edema, unspecified: Secondary | ICD-10-CM | POA: Diagnosis not present

## 2022-09-05 DIAGNOSIS — B351 Tinea unguium: Secondary | ICD-10-CM | POA: Diagnosis not present

## 2022-09-05 DIAGNOSIS — Z794 Long term (current) use of insulin: Secondary | ICD-10-CM

## 2022-09-05 DIAGNOSIS — M79675 Pain in left toe(s): Secondary | ICD-10-CM

## 2022-09-05 DIAGNOSIS — E1165 Type 2 diabetes mellitus with hyperglycemia: Secondary | ICD-10-CM | POA: Diagnosis not present

## 2022-09-05 DIAGNOSIS — N289 Disorder of kidney and ureter, unspecified: Secondary | ICD-10-CM | POA: Diagnosis not present

## 2022-09-05 DIAGNOSIS — M79674 Pain in right toe(s): Secondary | ICD-10-CM

## 2022-09-05 DIAGNOSIS — I872 Venous insufficiency (chronic) (peripheral): Secondary | ICD-10-CM

## 2022-09-05 NOTE — Progress Notes (Signed)
This patient returns to my office for at risk foot care.  This patient requires this care by a professional since this patient will be at risk due to having diabetes and venous stasis.  This patient is unable to cut nails himself since the patient cannot reach his nails.These nails are painful walking and wearing shoes.  This patient presents for at risk foot care today.  General Appearance  Alert, conversant and in no acute stress.  Vascular  Dorsalis pedis and posterior tibial  pulses are not palpable  bilaterally due to severe swelling..  Capillary return is within normal limits  bilaterally. Temperature is within normal limits  bilaterally.  Neurologic  Senn-Weinstein monofilament wire test within normal limits  bilaterally. Muscle power within normal limits bilaterally.  Nails Thick disfigured discolored nails with subungual debris  from hallux to fifth toes bilaterally. No evidence of bacterial infection or drainage bilaterally.  Orthopedic  No limitations of motion  feet .  No crepitus or effusions noted.  No bony pathology or digital deformities noted.  Skin  normotropic skin with no porokeratosis noted bilaterally.  No signs of infections or ulcers noted.     Onychomycosis  Pain in right toes  Pain in left toes  Swelling legs/feet  Consent was obtained for treatment procedures.   Mechanical debridement of nails 1-5  bilaterally performed with a nail nipper.  Filed with dremel without incident.    Return office visit   3 months                   Told patient to return for periodic foot care and evaluation due to potential at risk complications.   Shelagh Rayman DPM  

## 2022-09-09 ENCOUNTER — Telehealth: Payer: Self-pay | Admitting: Neurology

## 2022-09-09 NOTE — Telephone Encounter (Signed)
Called to sch a EEG for patient  09-01-22 LMOM  09-04-22 LMOM   09-09-22 LMOM  

## 2022-09-10 NOTE — Progress Notes (Signed)
Chief Complaint:   OBESITY Luis [name] is here to discuss his progress with his obesity treatment plan along with follow-up of his obesity related diagnoses. Luis [name] is on the Category 4 Plan and states he is following his eating plan approximately 70% of the time. Luis [name] states he is doing 0 minutes 0 times per week.  Today's visit was #: 3 Starting weight: 383 lbs Starting date: 07/31/2022 Today's weight: 377 lbs Today's date: 08/28/2022 Total lbs lost to date: 6 Total lbs lost since last in-office visit: 0  Interim History: Jiovani was started on Mounjaro.  He has noticed decrease in his appetite once he has been skipping meals and not hitting his protein goal.  He denies medication side effects.  He has noticed a decrease in blood glucose, 120s pre-meal.  No postprandial results.  He is not following the plan as stated.  Subjective:   1. Type 2 diabetes mellitus with other specified complication, unspecified whether long term insulin use (HCC) Luis [name]'s home blood glucose monitoring ranges and the 120s before meals.  He has no symptoms of hypoglycemia.  He denies side effects to [facility].  Assessment/Plan:   1. Type 2 diabetes mellitus with other specified complication, unspecified whether long term insulin use (HCC) Luis [name] is to check his blood glucose pre and post meals.  Start decreasing insulin if his blood sugars are below 100.  He is to stay at his current dose for now.  If he is eating appropriately, then may increase to 5 mg at his next follow-up visit.  Medication was not refilled today until he is reevaluated in 2 weeks before increasing to the next dose.  2. Obesity,current BMI 47.2 Luis [name] is currently in the action stage of change. As such, his goal is to continue with weight loss efforts. He has agreed to the Category 4 Plan.   Reviewed the meal plan again today with the patient.  Exercise goals: No exercise has been prescribed at this time.  Behavioral  modification strategies: increasing lean protein intake, increasing vegetables, increasing water intake, no skipping meals, and planning for success.  Luis [name] has agreed to follow-up with our clinic in 2 weeks. He was informed of the importance of frequent follow-up visits to maximize his success with intensive lifestyle modifications for his multiple health conditions.   Objective:   Blood pressure 118/75, pulse 75, temperature 98.1 F (36.7 C), height 6\' 3"  (1.905 m), weight (!) 377 lb 9.6 oz (171.3 kg), SpO2 94 %. Body mass index is 47.2 kg/m.  General: Cooperative, alert, well developed, in no acute distress. HEENT: Conjunctivae and lids unremarkable. Cardiovascular: Regular rhythm.  Lungs: Normal work of breathing. Neurologic: No focal deficits.   Lab Results  Component Value Date   CREATININE 1.18 08/07/2022   BUN 18 08/07/2022   NA 137 08/07/2022   K 3.9 08/07/2022   CL 104 08/07/2022   CO2 23 08/07/2022   Lab Results  Component Value Date   ALT 13 07/31/2022   AST 15 07/31/2022   ALKPHOS 156 (H) 07/31/2022   BILITOT 0.4 07/31/2022   Lab Results  Component Value Date   HGBA1C 7.2 (H) 07/31/2022   HGBA1C 6.8 (A) 04/03/2022   HGBA1C 7.4 (H) 10/03/2021   HGBA1C 6.8 (A) 02/21/2021   HGBA1C 6.0 (A) 08/23/2020   Lab Results  Component Value Date   INSULIN 36.2 (H) 07/31/2022   Lab Results  Component Value Date   TSH 1.560 07/31/2022   Lab Results  Component Value  Date   CHOL 145 07/31/2022   HDL 32 (L) 07/31/2022   LDLCALC 94 07/31/2022   TRIG 103 07/31/2022   CHOLHDL 4.5 07/31/2022   Lab Results  Component Value Date   VD25OH 9.4 (L) 07/31/2022   Lab Results  Component Value Date   WBC 12.1 (H) 08/07/2022   HGB 14.3 08/07/2022   HCT 42.5 08/07/2022   MCV 97.9 08/07/2022   PLT 255 08/07/2022   No results found for: "IRON", "TIBC", "FERRITIN"  Attestation Statements:   Reviewed by clinician on day of visit: allergies, medications, problem list,  medical history, surgical history, family history, social history, and previous encounter notes.  Time spent on visit including pre-visit chart review and post-visit care and charting was 20 minutes.   Trude Mcburney, am acting as transcriptionist for Worthy Rancher, MD.  I have reviewed the above documentation for accuracy and completeness, and I agree with the above. -Worthy Rancher, MD

## 2022-09-18 ENCOUNTER — Ambulatory Visit (INDEPENDENT_AMBULATORY_CARE_PROVIDER_SITE_OTHER): Payer: BC Managed Care – PPO | Admitting: Internal Medicine

## 2022-10-02 ENCOUNTER — Ambulatory Visit: Payer: BC Managed Care – PPO | Admitting: Cardiology

## 2022-10-02 ENCOUNTER — Ambulatory Visit (INDEPENDENT_AMBULATORY_CARE_PROVIDER_SITE_OTHER): Payer: BC Managed Care – PPO | Admitting: Internal Medicine

## 2022-10-09 ENCOUNTER — Ambulatory Visit (INDEPENDENT_AMBULATORY_CARE_PROVIDER_SITE_OTHER): Payer: BC Managed Care – PPO | Admitting: Internal Medicine

## 2022-10-09 ENCOUNTER — Encounter: Payer: BC Managed Care – PPO | Admitting: Family Medicine

## 2022-10-09 ENCOUNTER — Other Ambulatory Visit: Payer: Self-pay | Admitting: Family Medicine

## 2022-10-20 ENCOUNTER — Other Ambulatory Visit: Payer: Self-pay

## 2022-10-20 ENCOUNTER — Emergency Department (HOSPITAL_BASED_OUTPATIENT_CLINIC_OR_DEPARTMENT_OTHER): Payer: BC Managed Care – PPO

## 2022-10-20 ENCOUNTER — Encounter (HOSPITAL_BASED_OUTPATIENT_CLINIC_OR_DEPARTMENT_OTHER): Payer: Self-pay

## 2022-10-20 ENCOUNTER — Emergency Department (HOSPITAL_BASED_OUTPATIENT_CLINIC_OR_DEPARTMENT_OTHER)
Admission: EM | Admit: 2022-10-20 | Discharge: 2022-10-20 | Disposition: A | Discharge status: Home / Self Care | Payer: BC Managed Care – PPO | Attending: Emergency Medicine | Admitting: Emergency Medicine

## 2022-10-20 DIAGNOSIS — R0789 Other chest pain: Secondary | ICD-10-CM | POA: Diagnosis not present

## 2022-10-20 DIAGNOSIS — Z79899 Other long term (current) drug therapy: Secondary | ICD-10-CM | POA: Insufficient documentation

## 2022-10-20 DIAGNOSIS — R790 Abnormal level of blood mineral: Secondary | ICD-10-CM | POA: Diagnosis not present

## 2022-10-20 DIAGNOSIS — R079 Chest pain, unspecified: Secondary | ICD-10-CM | POA: Insufficient documentation

## 2022-10-20 DIAGNOSIS — Z1152 Encounter for screening for COVID-19: Secondary | ICD-10-CM | POA: Insufficient documentation

## 2022-10-20 DIAGNOSIS — E119 Type 2 diabetes mellitus without complications: Secondary | ICD-10-CM | POA: Diagnosis not present

## 2022-10-20 DIAGNOSIS — R059 Cough, unspecified: Secondary | ICD-10-CM | POA: Diagnosis not present

## 2022-10-20 DIAGNOSIS — R0602 Shortness of breath: Secondary | ICD-10-CM | POA: Diagnosis not present

## 2022-10-20 DIAGNOSIS — I509 Heart failure, unspecified: Secondary | ICD-10-CM | POA: Insufficient documentation

## 2022-10-20 DIAGNOSIS — J189 Pneumonia, unspecified organism: Secondary | ICD-10-CM | POA: Diagnosis not present

## 2022-10-20 DIAGNOSIS — I110 Hypertensive heart disease with heart failure: Secondary | ICD-10-CM | POA: Insufficient documentation

## 2022-10-20 DIAGNOSIS — Z7984 Long term (current) use of oral hypoglycemic drugs: Secondary | ICD-10-CM | POA: Diagnosis not present

## 2022-10-20 DIAGNOSIS — Z7982 Long term (current) use of aspirin: Secondary | ICD-10-CM | POA: Insufficient documentation

## 2022-10-20 LAB — COMPREHENSIVE METABOLIC PANEL
ALT: 12 U/L (ref 0–44)
AST: 14 U/L — ABNORMAL LOW (ref 15–41)
Albumin: 3.9 g/dL (ref 3.5–5.0)
Alkaline Phosphatase: 97 U/L (ref 38–126)
Anion gap: 12 (ref 5–15)
BUN: 16 mg/dL (ref 6–20)
CO2: 27 mmol/L (ref 22–32)
Calcium: 8.4 mg/dL — ABNORMAL LOW (ref 8.9–10.3)
Chloride: 104 mmol/L (ref 98–111)
Creatinine, Ser: 1.12 mg/dL (ref 0.61–1.24)
GFR, Estimated: >60 mL/min (ref >60)
Glucose, Bld: 129 mg/dL — ABNORMAL HIGH (ref 70–99)
Potassium: 3.7 mmol/L (ref 3.5–5.1)
Sodium: 143 mmol/L (ref 135–145)
Total Bilirubin: 0.9 mg/dL (ref 0.3–1.2)
Total Protein: 7.5 g/dL (ref 6.5–8.1)

## 2022-10-20 LAB — CBC
HCT: 43.4 % (ref 39.0–52.0)
Hemoglobin: 14.4 g/dL (ref 13.0–17.0)
MCH: 32.7 pg (ref 26.0–34.0)
MCHC: 33.2 g/dL (ref 30.0–36.0)
MCV: 98.6 fL (ref 80.0–100.0)
Platelets: 235 10*3/uL (ref 150–400)
RBC: 4.4 MIL/uL (ref 4.22–5.81)
RDW: 14.9 % (ref 11.5–15.5)
WBC: 9.5 10*3/uL (ref 4.0–10.5)
nRBC: 0 % (ref 0.0–0.2)

## 2022-10-20 LAB — RESP PANEL BY RT-PCR (RSV, FLU A&B, COVID)  RVPGX2
Influenza A by PCR: NEGATIVE
Influenza B by PCR: NEGATIVE
Resp Syncytial Virus by PCR: NEGATIVE
SARS Coronavirus 2 by RT PCR: NEGATIVE

## 2022-10-20 LAB — BRAIN NATRIURETIC PEPTIDE: B Natriuretic Peptide: 173.9 pg/mL — ABNORMAL HIGH (ref 0.0–100.0)

## 2022-10-20 LAB — TROPONIN I (HIGH SENSITIVITY)
Troponin I (High Sensitivity): 12 ng/L (ref <18)
Troponin I (High Sensitivity): 11 ng/L (ref <18)

## 2022-10-20 MED ORDER — BENZONATATE 100 MG PO CAPS: 100.0000 mg | ORAL_CAPSULE | Freq: Three times a day (TID) | ORAL | 0 refills | Status: DC

## 2022-10-20 NOTE — Discharge Instructions (Signed)
Evaluation chest pain shortness of breath and cough is overall reassuring.  Your BNP was elevated which we discussed and recommend that you follow-up with your cardiologist this week for this finding.  At this time I have a low suspicion for CHF exacerbation.  If you have worsening shortness of breath and chest pain, new lower extremity swelling or calf tenderness or any other concerning finding please return to emergency department for the evaluation.

## 2022-10-20 NOTE — ED Notes (Signed)
Provided pt with peanut butter crackers and diet coke.

## 2022-10-20 NOTE — ED Provider Notes (Cosign Needed Addendum)
[facility] EMERGENCY DEPT Provider Note   CSN: [id number] Arrival date & time: 10/20/22  1012     History  No chief complaint on file.  HPI Luis [name] is a 56 y.o. male with hypertension, hyperlipidemia, A-fib, CHF presenting for shortness of breath and cough.  Started 9 PM last night.  Initially short of breath started to have a persistent cough and then subsequent chest pain.  Chest pain is midsternal and only occurs with coughing.  With trach green sputum.  Denies fever and sick contacts.  Shortness of breath is worse with exertion.  Patient is not on a blood thinner at this time for his A-fib.  Denies calf tenderness.  Patient also stated he has not noticed any worsening lower extremity edema and states he has lost 15 pounds in the last month.  HPI     Home Medications Prior to Admission medications   Medication Sig Start Date End Date Taking? Authorizing Provider  benzonatate (TESSALON) 100 MG capsule Take 1 capsule (100 mg total) by mouth every 8 (eight) hours. 10/20/22  Yes Harriet Pho, PA-C  aspirin EC 81 MG EC tablet Take 1 tablet (81 mg total) by mouth daily. 03/26/16   Theodis Blaze, MD  carvedilol (COREG) 25 MG tablet Take 1 tablet (25 mg total) by mouth 2 (two) times daily. 12/26/21   Furth, Cadence H, PA-C  cyclobenzaprine (FLEXERIL) 10 MG tablet Take 1 tablet (10 mg total) by mouth 2 (two) times daily as needed for muscle spasms. 06/11/22   Gareth Morgan, MD  doxycycline (VIBRAMYCIN) 100 MG capsule Take 1 capsule (100 mg total) by mouth 2 (two) times daily. 08/08/22   Fatima Blank, MD  glucose blood test strip Check glucose TID/ E11.9 06/25/22   Vivi Barrack, MD  ibuprofen (ADVIL) 800 MG tablet Take 1 tablet (800 mg total) by mouth every 8 (eight) hours as needed. 06/17/22   Vivi Barrack, MD  insulin aspart protamine - aspart (NOVOLOG MIX 70/30 FLEXPEN) (70-30) 100 UNIT/ML FlexPen Inject 22 Units into the skin 2 (two) times daily.  04/03/22   Vivi Barrack, MD  Insulin Pen Needle 31G X 8 MM MISC Use to inject insulin twice daily 04/13/19   Vivi Barrack, MD  Insulin Pen Needle 32G X 6 MM MISC Use to administer insulin two times daily 04/05/18   Vivi Barrack, MD  Nutritional Supplements (GLUCOSE MANAGEMENT) TABS Use as needed for low blood sugar 08/14/22   Bowen, Collene Leyden, DO  OneTouch Delica Lancets 99991111 MISC Use TID/ E11.9 06/06/21   Vivi Barrack, MD  potassium chloride SA (KLOR-CON M) 20 MEQ tablet TAKE 1 TABLET(20 MEQ) BY MOUTH DAILY 08/18/22   Vivi Barrack, MD  simvastatin (ZOCOR) 20 MG tablet TAKE 1 TABLET(20 MG) BY MOUTH AT BEDTIME 05/13/22   Vivi Barrack, MD  spironolactone (ALDACTONE) 25 MG tablet Take 1 tablet (25 mg total) by mouth daily. 06/24/22 08/23/22  Gerrie Nordmann, NP  tirzepatide [facility]) 2.5 MG/0.5ML Pen Inject 2.5 mg into the skin once a week. 08/14/22   Bowen, Collene Leyden, DO  torsemide (DEMADEX) 10 MG tablet TAKE 1 TABLET(10 MG) BY MOUTH TWICE DAILY 10/09/22   Vivi Barrack, MD  Vitamin D, Ergocalciferol, (DRISDOL) 1.25 MG (50000 UNIT) CAPS capsule Take 1 capsule twice a week 08/14/22   Bowen, Collene Leyden, DO      Allergies    Demerol [meperidine] and Morphine and related    Review  of Systems   Review of Systems  Respiratory:  Positive for cough and shortness of breath.   Cardiovascular:  Positive for chest pain.    Physical Exam Updated Vital Signs BP (!) 150/105   Pulse 68   Temp 98.5 F (36.9 C) (Oral)   Resp 18   Ht 6\' 3"  (1.905 m)   Wt (!) 170.1 kg   SpO2 93%   BMI 46.87 kg/m  Physical Exam Vitals and nursing note reviewed.  HENT:     Head: Normocephalic and atraumatic.     Mouth/Throat:     Mouth: Mucous membranes are moist.  Eyes:     General:        Right eye: No discharge.        Left eye: No discharge.     Conjunctiva/sclera: Conjunctivae normal.  Cardiovascular:     Rate and Rhythm: Normal rate and regular rhythm.     Pulses: Normal pulses.     Heart sounds:  Normal heart sounds.  Pulmonary:     Effort: Pulmonary effort is normal.     Breath sounds: Normal breath sounds.  Abdominal:     General: Abdomen is flat.     Palpations: Abdomen is soft.  Musculoskeletal:     Comments: 1+ nonpitting edema around the ankles  Skin:    General: Skin is warm and dry.  Neurological:     General: No focal deficit present.  Psychiatric:        Mood and Affect: Mood normal.     ED Results / Procedures / Treatments   Labs (all labs ordered are listed, but only abnormal results are displayed) Labs Reviewed  COMPREHENSIVE METABOLIC PANEL - Abnormal; Notable for the following components:      Result Value   Glucose, Bld 129 (*)    Calcium 8.4 (*)    AST 14 (*)    All other components within normal limits  BRAIN NATRIURETIC PEPTIDE - Abnormal; Notable for the following components:   B Natriuretic Peptide 173.9 (*)    All other components within normal limits  RESP PANEL BY RT-PCR (RSV, FLU A&B, COVID)  [id number]  CBC  TROPONIN I (HIGH SENSITIVITY)  TROPONIN I (HIGH SENSITIVITY)    EKG EKG Interpretation  Date/Time:  Monday October 20 2022 10:38:30 EST Ventricular Rate:  86 PR Interval:  154 QRS Duration: 92 QT Interval:  364 QTC Calculation: 435 R Axis:   64 Text Interpretation: Unusual P axis, possible ectopic atrial rhythm no acute ST/T changes nonspecific T changes similar to oct 2023 Confirmed by Sherwood Gambler [phone]) on 10/20/2022 11:49:46 AM  Radiology DG Chest Port 1 View  Result Date: 10/20/2022 CLINICAL DATA:  Shortness of breath. Cough. Chest pain. EXAM: PORTABLE CHEST 1 VIEW COMPARISON:  Radiograph 08/07/2022, CT 06/11/2022 FINDINGS: Upper normal heart size likely accentuated by portable AP technique. Stable mediastinal contours. Mild bronchial thickening without focal airspace disease. No pulmonary edema, pneumothorax or significant pleural effusion. On limited assessment, no acute osseous findings IMPRESSION: Mild bronchial  thickening without focal airspace disease. Electronically Signed   By: Keith Rake M.D.   On: 10/20/2022 11:40    Procedures Procedures    Medications Ordered in ED Medications - No data to display  ED Course/ Medical Decision Making/ A&P                           Medical Decision Making Amount and/or Complexity of Data Reviewed Labs: ordered.  Radiology: ordered.  Risk Prescription drug management.   Initial Impression and Ddx 56 year old male who is well-appearing, nontoxic and hemodynamically stable presenting for cough, shortness of breath and chest pain.  Physical exam is overall reassuring.  Differential diagnosis for this complaint includes ACS, PE, CHF exacerbation pneumonia, and URI. Patient PMH that increases complexity of ED encounter: CHF, A-fib, hypertension and hyperlipidemia and diabetes  Interpretation of Diagnostics I independent reviewed and interpreted the labs as followed: Elevated BNP, hyperglycemia  - I independently visualized the following imaging with scope of interpretation limited to determining acute life threatening conditions related to emergency care: CXR, which revealed mild bronchial thickening without acute cardiopulmonary process  I independently reviewed and interpreted EKG which was nonischemic  Patient Reassessment and Ultimate Disposition/Management Considered ACS but unlikely given reassuring EKG, negative troponin and upon reevaluation patient stated that he no longer had chest pain.  PE but unlikely given patient was not tachycardic, no calf tenderness and not hypoxic normotensive.  Also considered pneumonia but unlikely given no focal opacity on chest x-ray and no fever.  Labs did reveal elevated BNP which raise concern for possible CHF exacerbation but unlikely given patient has lost weight in the last month no evidence of pulmonary edema or pleural effusion on chest x-ray and no pitting edema in his lower extremities.  Of note most  recent echo in February of this year revealed that his EF was 60 to 65%.  Advised him to follow-up with his cardiologist this week for this finding.  Considered COVID flu and RSV but unlikely given negative PCR.  Symptoms are consistent with viral URI.  To follow-up with his PCP if symptoms persisted.  Patient requested cough suppressant for sleep at night.  Sent Tessalon Perles to his pharmacy.  Patient management required discussion with the following services or consulting groups:  None  Complexity of Problems Addressed Acute complicated illness or Injury  Additional Data Reviewed and Analyzed Further history obtained from: Prior labs/imaging results  Patient Encounter Risk Assessment Consideration of hospitalization         Final Clinical Impression(s) / ED Diagnoses Final diagnoses:  Shortness of breath  Cough, unspecified type  Chest pain, unspecified type    Rx / DC Orders ED Discharge Orders          Ordered    benzonatate (TESSALON) 100 MG capsule  Every 8 hours        10/20/22 1711              Gareth Eagle, PA-C 10/20/22 1651    Gareth Eagle, PA-C 10/20/22 1736    Pricilla Loveless, MD 10/22/22 [phone]

## 2022-10-20 NOTE — ED Triage Notes (Signed)
Pt came form home VIA EMS pt states having shortness of breath, coughing chest pain when coughing, productive cough with green sputum.

## 2022-11-06 ENCOUNTER — Ambulatory Visit (INDEPENDENT_AMBULATORY_CARE_PROVIDER_SITE_OTHER): Payer: BC Managed Care – PPO | Admitting: Internal Medicine

## 2022-11-06 ENCOUNTER — Other Ambulatory Visit: Payer: Self-pay | Admitting: *Deleted

## 2022-11-06 ENCOUNTER — Telehealth: Payer: Self-pay | Admitting: Family Medicine

## 2022-11-06 ENCOUNTER — Encounter (INDEPENDENT_AMBULATORY_CARE_PROVIDER_SITE_OTHER): Payer: Self-pay | Admitting: Internal Medicine

## 2022-11-06 VITALS — BP 145/84 | HR 75 | Temp 98.2°F | Ht 75.0 in | Wt 381.0 lb

## 2022-11-06 DIAGNOSIS — E1169 Type 2 diabetes mellitus with other specified complication: Secondary | ICD-10-CM

## 2022-11-06 DIAGNOSIS — E669 Obesity, unspecified: Secondary | ICD-10-CM | POA: Diagnosis not present

## 2022-11-06 DIAGNOSIS — I10 Essential (primary) hypertension: Secondary | ICD-10-CM

## 2022-11-06 DIAGNOSIS — Z6841 Body Mass Index (BMI) 40.0 and over, adult: Secondary | ICD-10-CM

## 2022-11-06 DIAGNOSIS — Z794 Long term (current) use of insulin: Secondary | ICD-10-CM

## 2022-11-06 DIAGNOSIS — E876 Hypokalemia: Secondary | ICD-10-CM

## 2022-11-06 MED ORDER — POTASSIUM CHLORIDE CRYS ER 20 MEQ PO TBCR: EXTENDED_RELEASE_TABLET | ORAL | 2 refills | Status: DC

## 2022-11-06 NOTE — Progress Notes (Signed)
Chief Complaint:   OBESITY Luis [name] is here to discuss his progress with his obesity treatment plan along with follow-up of his obesity related diagnoses. Luis [name] is on the Category 4 Plan and states he is following his eating plan approximately 0% of the time. Luis [name] states he is doing 0 minutes 0 times per week.  Today's visit was #: 4 Starting weight: 383 lbs Starting date: 07/31/2022 Today's weight: 381 lbs Today's date: 11/06/2022 Total lbs lost to date: 2 Total lbs lost since last in-office visit: 0  Interim History: Interval history: Luis [name] presents today for follow-up.  Since last office visit he has increased 4 pounds.  We had held his [facility] as patient was having a difficult time consuming prescribed reduced calorie nutrition plan because of loss of appetite.  Unfortunately this coincided with the holidays and patient decided to not follow-up plan during this period of time.  Patient is relatively new to our program and still working on being more consistent with lifestyle changes.  Patient feels he is committed and feels confident about implementation of plan.  He finds following plan easy and he enjoys the food on the plan.  He denies consumption of liquid calories or having problems with portion sizes or abnormal cravings.  Subjective:   1. Type 2 diabetes mellitus with other specified complication, unspecified whether long term insulin use (Luis [name]) Patient counseled on goals of care.  He denies hypoglycemia.  Luis [name] is currently on hold and he remains on mixed insulin twice daily which she has been self adjusting based on Premeal.  2. Essential hypertension Blood pressure slightly above goal.  His goal is less than 130/80.  He is currently on carvedilol and spironolactone.  I do not see that he is on an ARB or ACE we will review his records.  Assessment/Plan:   1. Type 2 diabetes mellitus with other specified complication, unspecified whether long term insulin use  (HCC) Implement meal plan which should improve glycemic control and reduce the need of insulin.  Patient advised to check blood sugars before meals alternating with 2-hour postprandials.  I provided him with goals of care.  We will continue to hold Luis [name] for the time being we will consider restarting at the next office visit if blood sugars are not well-controlled on nutrition plan.  2. Essential hypertension Weight loss therapy.  Patient advised to obtain blood pressure cuff.  Demonstrated proper technique advised to monitor his blood pressure at home to make sure that blood pressure is at goal.  3. Obesity,current BMI 47.6 Continue to hold Luis [name] for now until patient reinitiates nutrition plan due to loss of appetite and inability to ingest prescribed nutrition while on medication.  Luis [name] is currently in the action stage of change. As such, his goal is to continue with weight loss efforts. He has agreed to the Category 4 Plan.   Exercise goals: All adults should avoid inactivity. Some physical activity is better than none, and adults who participate in any amount of physical activity gain some health benefits.  Behavioral modification strategies: increasing lean protein intake, increasing vegetables, increasing water intake, decreasing eating out, no skipping meals, meal planning and cooking strategies, keeping healthy foods in the home, avoiding temptations, and planning for success.  Luis [name] has agreed to follow-up with our clinic in 3 weeks. He was informed of the importance of frequent follow-up visits to maximize his success with intensive lifestyle modifications for his multiple health conditions.   Objective:   Blood pressure Luis [name] Kitchen)  145/84, pulse 75, temperature 98.2 F (36.8 C), height 6\' 3"  (1.905 m), weight (!) 381 lb (172.8 kg), SpO2 99 %. Body mass index is 47.62 kg/m.  General: Cooperative, alert, well developed, in no acute distress. HEENT: Conjunctivae and lids  unremarkable. Cardiovascular: Regular rhythm.  Lungs: Normal work of breathing. Neurologic: No focal deficits.   Lab Results  Component Value Date   CREATININE 1.12 10/20/2022   BUN 16 10/20/2022   NA 143 10/20/2022   K 3.7 10/20/2022   CL 104 10/20/2022   CO2 27 10/20/2022   Lab Results  Component Value Date   ALT 12 10/20/2022   AST 14 (L) 10/20/2022   ALKPHOS 97 10/20/2022   BILITOT 0.9 10/20/2022   Lab Results  Component Value Date   HGBA1C 7.2 (H) 07/31/2022   HGBA1C 6.8 (A) 04/03/2022   HGBA1C 7.4 (H) 10/03/2021   HGBA1C 6.8 (A) 02/21/2021   HGBA1C 6.0 (A) 08/23/2020   Lab Results  Component Value Date   INSULIN 36.2 (H) 07/31/2022   Lab Results  Component Value Date   TSH 1.560 07/31/2022   Lab Results  Component Value Date   CHOL 145 07/31/2022   HDL 32 (L) 07/31/2022   LDLCALC 94 07/31/2022   TRIG 103 07/31/2022   CHOLHDL 4.5 07/31/2022   Lab Results  Component Value Date   VD25OH 9.4 (L) 07/31/2022   Lab Results  Component Value Date   WBC 9.5 10/20/2022   HGB 14.4 10/20/2022   HCT 43.4 10/20/2022   MCV 98.6 10/20/2022   PLT 235 10/20/2022   No results found for: "IRON", "TIBC", "FERRITIN"  Attestation Statements:   Reviewed by clinician on day of visit: allergies, medications, problem list, medical history, surgical history, family history, social history, and previous encounter notes.   Luis [name], am acting as transcriptionist for Thomes Dinning, MD.  I have reviewed the above documentation for accuracy and completeness, and I agree with the above. -Thomes Dinning, MD

## 2022-11-06 NOTE — Telephone Encounter (Signed)
Rx send to pharmacy  

## 2022-11-06 NOTE — Telephone Encounter (Signed)
Patient states he is unable to find the following medication (may have accidentally thrown it away)  Requests a new RX (per Pharmacy) for potassium chloride SA (KLOR-CON M) 20 MEQ tablet  be sent asap (Patient states he has been out for 3 days) to   [facility], [place] Phone: [phone]  Fax: [phone]

## 2022-11-21 ENCOUNTER — Ambulatory Visit: Payer: BC Managed Care – PPO | Admitting: Family Medicine

## 2022-11-24 ENCOUNTER — Other Ambulatory Visit: Payer: Self-pay | Admitting: Family Medicine

## 2022-11-24 DIAGNOSIS — E876 Hypokalemia: Secondary | ICD-10-CM

## 2022-12-01 ENCOUNTER — Ambulatory Visit (INDEPENDENT_AMBULATORY_CARE_PROVIDER_SITE_OTHER): Payer: BC Managed Care – PPO | Admitting: Internal Medicine

## 2022-12-05 ENCOUNTER — Other Ambulatory Visit: Payer: Self-pay | Admitting: Family Medicine

## 2022-12-05 DIAGNOSIS — E782 Mixed hyperlipidemia: Secondary | ICD-10-CM

## 2022-12-08 ENCOUNTER — Ambulatory Visit: Payer: BC Managed Care – PPO | Admitting: Podiatry

## 2022-12-08 ENCOUNTER — Encounter: Payer: Self-pay | Admitting: Podiatry

## 2022-12-08 ENCOUNTER — Ambulatory Visit (INDEPENDENT_AMBULATORY_CARE_PROVIDER_SITE_OTHER): Payer: BC Managed Care – PPO | Admitting: Podiatry

## 2022-12-08 VITALS — BP 141/74 | HR 95

## 2022-12-08 DIAGNOSIS — Z794 Long term (current) use of insulin: Secondary | ICD-10-CM

## 2022-12-08 DIAGNOSIS — E1165 Type 2 diabetes mellitus with hyperglycemia: Secondary | ICD-10-CM | POA: Diagnosis not present

## 2022-12-08 DIAGNOSIS — M79675 Pain in left toe(s): Secondary | ICD-10-CM

## 2022-12-08 DIAGNOSIS — M79674 Pain in right toe(s): Secondary | ICD-10-CM

## 2022-12-08 DIAGNOSIS — B351 Tinea unguium: Secondary | ICD-10-CM

## 2022-12-08 NOTE — Progress Notes (Signed)
This patient returns to my office for at risk foot care.  This patient requires this care by a professional since this patient will be at risk due to having diabetes and venous stasis.  This patient is unable to cut nails himself since the patient cannot reach his nails.These nails are painful walking and wearing shoes.  This patient presents for at risk foot care today.  General Appearance  Alert, conversant and in no acute stress.  Vascular  Dorsalis pedis and posterior tibial  pulses are not palpable  bilaterally due to severe swelling..  Capillary return is within normal limits  bilaterally. Temperature is within normal limits  bilaterally.  Neurologic  Senn-Weinstein monofilament wire test within normal limits  bilaterally. Muscle power within normal limits bilaterally.  Nails Thick disfigured discolored nails with subungual debris  from hallux to fifth toes bilaterally. No evidence of bacterial infection or drainage bilaterally.  Orthopedic  No limitations of motion  feet .  No crepitus or effusions noted.  No bony pathology or digital deformities noted.  Skin  normotropic skin with no porokeratosis noted bilaterally.  No signs of infections or ulcers noted.     Onychomycosis  Pain in right toes  Pain in left toes  Swelling legs/feet  Consent was obtained for treatment procedures.   Mechanical debridement of nails 1-5  bilaterally performed with a nail nipper.  Filed with dremel without incident.    Return office visit   3 months                   Told patient to return for periodic foot care and evaluation due to potential at risk complications.   Gardiner Barefoot DPM

## 2022-12-18 NOTE — Progress Notes (Deleted)
Cardiology Clinic Note   Patient Name: Luis [name] Date of Encounter: 12/18/2022  Primary Care Provider:  Vivi Barrack, MD Primary Cardiologist:  Luis Chandler, MD  Patient Profile    57 year old male with a past medical history of syncope, nonischemic cardiomyopathy, chronic systolic congestive heart failure, mild nonobstructive coronary artery disease, essential hypertension, obesity, diabetes, sleep apnea, and medical noncompliance, who is here today for follow-up on his nonischemic cardiomyopathy and hypertension.  Past Medical History    Past Medical History:  Diagnosis Date   Chronic HFimpEF (heart failure with improved ejection fraction) ([place])    a. 03/2016 Echo: EF 35-40%, diff HK, GrII DD; b. 03/2017 Echo: EF 50-55%; c. 12/2021 Echo: EF 60-65%, no rwma, GrI DD, mod enlarged RV w/ nl fxn, Ao root 11m, Asc Ao 344m   Difficult intubation 12/06/2020   DM [place]diabetes mellitus) ([facility]   Edema    both ankles and feet   Essential hypertension    History of tobacco abuse    Hyperlipidemia    Morbid obesity (HCC)    NICM (nonischemic cardiomyopathy) ([facility]   a. 03/2016 Echo: EF 35-40%, diff HK, GrII DD; b. 03/2017 Echo: EF 50-55%; c. 12/2021 Echo: EF 60-65%, no rwma, GrI DD.   Non-obstructive CAD (coronary artery disease)    a. 03/2016 Cath: LM nl, LAD 35p, LCX nl, RCA nl. EF 35-45%.   Noncompliance    OSA (obstructive sleep apnea)    Pneumonia 2017   Premature atrial contractions    Shortness of breath    Sleep apnea    Past Surgical History:  Procedure Laterality Date   CARDIAC CATHETERIZATION N/A 03/24/2016   Procedure: Left Heart Cath and Coronary Angiography;  Surgeon: DaLeonie ManMD;  [facility];  Service: Cardiovascular;  Laterality: N/A;   FEMUR IM NAIL Right 2012   FRACTURE SURGERY      Allergies  Allergies  Allergen Reactions   Demerol [Meperidine] Itching   Morphine And Related Itching    History of Present Illness     Luis [name] a [age]ear old male with a previously mentioned past medical history of syncope, nonischemic cardiomyopathy, chronic systolic congestive monitor, mild nonobstructive coronary disease, history of hypertension, obesity, type 2 diabetes, sleep apnea, and medical noncompliance.  In 2017 echocardiogram revealed an LVEF of 35-40%.  Cardiac catheterization showed only mild LAD stenosis that was nonobstructive.  Repeat echocardiogram in 2018 revealed EF of 55%.  Echo 12/2021 revealed LVEF of 60-65%, G1 DD, no valvular abnormalities.  There was mild dilatation of the aortic root measuring 38 mm.  He was ZIO XT monitor after syncopal event which revealed primary sinus rhythm with IVCD 14 supraventricular tachycardia runs that occurred with the longest lasting 21.8 seconds there were rare PACs and rare PVCs.  He was last seen in clinic 08/07/2022 with complaints of cough, shortness of breath/DOE, fatigue and occasional palpitations.  He had a wound to the back of his left hand and 1 on his neck.  He does also continue to follow-up with healthy weight and wellness.    Home Medications    Current Outpatient Medications  Medication Sig Dispense Refill   aspirin EC 81 MG EC tablet Take 1 tablet (81 mg total) by mouth daily. 30 tablet 0   benzonatate (TESSALON) 100 MG capsule Take 1 capsule (100 mg total) by mouth every 8 (eight) hours. 21 capsule 0   carvedilol (COREG) 25 MG tablet Take 1  tablet (25 mg total) by mouth 2 (two) times daily. 180 tablet 3   cyclobenzaprine (FLEXERIL) 10 MG tablet Take 1 tablet (10 mg total) by mouth 2 (two) times daily as needed for muscle spasms. 20 tablet 0   doxycycline (VIBRAMYCIN) 100 MG capsule Take 1 capsule (100 mg total) by mouth 2 (two) times daily. 20 capsule 0   glucose blood test strip Check glucose TID/ E11.9 100 each 12   ibuprofen (ADVIL) 800 MG tablet Take 1 tablet (800 mg total) by mouth every 8 (eight) hours as needed. 60 tablet 0   insulin  aspart protamine - aspart (NOVOLOG MIX 70/30 FLEXPEN) (70-30) 100 UNIT/ML FlexPen Inject 22 Units into the skin 2 (two) times daily. 15 mL 11   Insulin Pen Needle 31G X 8 MM MISC Use to inject insulin twice daily 100 each 11   Insulin Pen Needle 32G X 6 MM MISC Use to administer insulin two times daily 90 each 11   Nutritional Supplements (GLUCOSE MANAGEMENT) TABS Use as needed for low blood sugar 30 tablet 2   OneTouch Delica Lancets 99991111 MISC Use TID/ E11.9 100 each 5   potassium chloride SA (KLOR-CON M) 20 MEQ tablet TAKE 1 TABLET(20 MEQ) BY MOUTH DAILY 30 tablet 2   simvastatin (ZOCOR) 20 MG tablet TAKE 1 TABLET(20 MG) BY MOUTH AT BEDTIME 90 tablet 1   spironolactone (ALDACTONE) 25 MG tablet Take 1 tablet (25 mg total) by mouth daily. 30 tablet 1   tirzepatide (MOUNJARO) 2.5 MG/0.5ML Pen Inject 2.5 mg into the skin once a week. 2 mL 0   torsemide (DEMADEX) 10 MG tablet TAKE 1 TABLET(10 MG) BY MOUTH TWICE DAILY 60 tablet 1   Vitamin D, Ergocalciferol, (DRISDOL) 1.25 MG (50000 UNIT) CAPS capsule Take 1 capsule twice a week 10 capsule 0   No current facility-administered medications for this visit.     Family History    Family History  Problem Relation Age of Onset   Diabetes Mellitus II Mother    Hypertension Maternal Grandfather    Diabetes type II Other    Stroke Neg Hx    Cancer Neg Hx    Colon cancer Neg Hx    Colon polyps Neg Hx    Esophageal cancer Neg Hx    Rectal cancer Neg Hx    Stomach cancer Neg Hx    He indicated that his mother is deceased. He indicated that his father is deceased. He indicated that his sister is alive. He indicated that his maternal grandmother is deceased. He indicated that his maternal grandfather is deceased. He indicated that his paternal grandmother is deceased. He indicated that his paternal grandfather is deceased. He indicated that the status of his neg hx is unknown. He indicated that the status of his other is unknown.  Social History     Social History   Socioeconomic History   Marital status: Single    Spouse name: Not on file   Number of children: 1   Years of education: 14   Highest education level: Not on file  Occupational History   Occupation: [employer]  Tobacco Use   Smoking status: Every Day    Packs/day: 0.50    Years: 20.00    Total pack years: 10.00    Types: Cigarettes    Last attempt to quit: 03/02/2009    Years since quitting: 13.8   Smokeless tobacco: Never  Vaping Use   Vaping Use: Never used  Substance and Sexual Activity  Alcohol use: Yes    Comment: occassional   Drug use: No   Sexual activity: Never  Other Topics Concern   Not on file  Social History Narrative   ** Merged History Encounter **       Lives in [place] with mother and three grandchildren.     Works for [employer] - works from home.     Does not routinely exercise.   Caffeine use: none   Fun/Hobby: Play video games and play with grandchildren.    Right handed    Social Determinants of Health   Financial Resource Strain: Not on file  Food Insecurity: Not on file  Transportation Needs: Not on file  Physical Activity: Not on file  Stress: Not on file  Social Connections: Not on file  Intimate Partner Violence: Not on file     Review of Systems    General:  No chills, fever, night sweats or weight changes.  Cardiovascular:  No chest pain, dyspnea on exertion, edema, orthopnea, palpitations, paroxysmal nocturnal dyspnea. Dermatological: No rash, lesions/masses Respiratory: No cough, dyspnea Urologic: No hematuria, dysuria Abdominal:   No nausea, vomiting, diarrhea, bright red blood per rectum, melena, or hematemesis Neurologic:  No visual changes, wkns, changes in mental status. All other systems reviewed and are otherwise negative except as noted above.     Physical Exam    VS:  There were no vitals taken for this visit. , BMI There is no height or weight on file to calculate BMI. STOP-Bang  Score:  8  { Consider Dx Sleep Disordered Breathing or Sleep Apnea  ICD G47.33          :1}    GEN: Well nourished, well developed, in no acute distress. HEENT: normal. Neck: Supple, no JVD, carotid bruits, or masses. Cardiac: RRR, no murmurs, rubs, or gallops. No clubbing, cyanosis, edema.  Radials 2+/PT 2+ and equal bilaterally.  Respiratory:  Respirations regular and unlabored, clear to auscultation bilaterally. GI: Soft, nontender, nondistended, BS + x 4. MS: no deformity or atrophy. Skin: warm and dry, no rash. Neuro:  Strength and sensation are intact. Psych: Normal affect.  Accessory Clinical Findings    ECG personally reviewed by me today- *** - No acute changes  Lab Results  Component Value Date   WBC 9.5 10/20/2022   HGB 14.4 10/20/2022   HCT 43.4 10/20/2022   MCV 98.6 10/20/2022   PLT 235 10/20/2022   Lab Results  Component Value Date   CREATININE 1.12 10/20/2022   BUN 16 10/20/2022   NA 143 10/20/2022   K 3.7 10/20/2022   CL 104 10/20/2022   CO2 27 10/20/2022   Lab Results  Component Value Date   ALT 12 10/20/2022   AST 14 (L) 10/20/2022   ALKPHOS 97 10/20/2022   BILITOT 0.9 10/20/2022   Lab Results  Component Value Date   CHOL 145 07/31/2022   HDL 32 (L) 07/31/2022   LDLCALC 94 07/31/2022   TRIG 103 07/31/2022   CHOLHDL 4.5 07/31/2022    Lab Results  Component Value Date   HGBA1C 7.2 (H) 07/31/2022    Assessment & Plan   1.  ***  Quadasia Newsham, NP 12/18/2022, 7:14 PM

## 2022-12-19 ENCOUNTER — Ambulatory Visit: Payer: BC Managed Care – PPO | Admitting: Cardiovascular Disease

## 2022-12-19 ENCOUNTER — Ambulatory Visit: Payer: BC Managed Care – PPO | Admitting: Cardiology

## 2022-12-29 ENCOUNTER — Other Ambulatory Visit: Payer: Self-pay

## 2022-12-29 MED ORDER — CARVEDILOL 25 MG PO TABS: 25.0000 mg | ORAL_TABLET | Freq: Two times a day (BID) | ORAL | 0 refills | Status: DC

## 2023-01-05 ENCOUNTER — Ambulatory Visit (INDEPENDENT_AMBULATORY_CARE_PROVIDER_SITE_OTHER): Payer: BC Managed Care – PPO | Admitting: Physician Assistant

## 2023-01-05 ENCOUNTER — Encounter (INDEPENDENT_AMBULATORY_CARE_PROVIDER_SITE_OTHER): Payer: Self-pay | Admitting: Physician Assistant

## 2023-01-05 VITALS — BP 128/82 | HR 62 | Temp 98.0°F | Ht 75.0 in | Wt 370.0 lb

## 2023-01-05 DIAGNOSIS — E669 Obesity, unspecified: Secondary | ICD-10-CM

## 2023-01-05 DIAGNOSIS — E1165 Type 2 diabetes mellitus with hyperglycemia: Secondary | ICD-10-CM

## 2023-01-05 DIAGNOSIS — Z7985 Long-term (current) use of injectable non-insulin antidiabetic drugs: Secondary | ICD-10-CM

## 2023-01-05 DIAGNOSIS — Z6841 Body Mass Index (BMI) 40.0 and over, adult: Secondary | ICD-10-CM

## 2023-01-05 DIAGNOSIS — Z794 Long term (current) use of insulin: Secondary | ICD-10-CM

## 2023-01-05 DIAGNOSIS — E559 Vitamin D deficiency, unspecified: Secondary | ICD-10-CM | POA: Diagnosis not present

## 2023-01-05 NOTE — Progress Notes (Signed)
Office: [phone]  /  Fax: [phone]  WEIGHT SUMMARY AND BIOMETRICS  Vitals Temp: 4 F (36.7 C) BP: 128/82 Pulse Rate: 62 SpO2: 96 %   Anthropometric Measurements Height: '6\' 3"'$  (1.905 m) Weight: (!) 370 lb (167.8 kg) BMI (Calculated): 46.25 Weight at Last Visit: 383 lb Weight Lost Since Last Visit: 11 lb Starting Weight: 383 lb   Body Composition  Body Fat %: 44 % Fat Mass (lbs): 163 lbs Muscle Mass (lbs): 197.6 lbs Total Body Water (lbs): 170.4 lbs Visceral Fat Rating : 30   Other Clinical Data Fasting: no Labs: no Today's Visit #: 4 Starting Date: 07/31/22     HPI  Chief Complaint: OBESITY  Luis [name] is here to discuss his progress with his obesity treatment plan. He is on the the Category 4 Plan and states he is following his eating plan approximately 50 % of the time. He states he is exercising walking minutes 10,  2 times per week.   Interval History:  Since last office visit he has done very well with weight loss. He reports that he had not been able to eat on plan consistently, but he has been able to go grocery shopping and is doing better with meal planning and prepping.  He continues to have some tendency towards skipping meals.  He was asking about protein shakes and bars and we discussed that it is better if he can actually eat a meal, but we would rather he not skip meals and tries to focus on consistently meeting his protein goals. Hunger and appetite are controlled when eating on plan. Notes some night time snacking.  He has been exercising more, walking in his neighborhood several times weekly and feels this is helping to promote some weight loss. Work stress is reasonable, he works for a call center, working from home and we discussed considering getting a pedal exerciser to utilize under his desk as he sits for 8 hours with only brief breaks. He reports his sleep is not restorative and he is working to get into see his primary care  provider to get evaluated for sleep apnea/possible CPAP. He is here for follow-up today along with his sister who is also a patient of [facility].  Pharmacotherapy: Darcel Bayley had been started previously but was placed on hold as he was skipping meals and not hitting his protein goals and has not been restarted.  PHYSICAL EXAM:  Blood pressure 128/82, pulse 62, temperature 98 F (36.7 C), height '6\' 3"'$  (1.905 m), weight (!) 370 lb (167.8 kg), SpO2 96 %. Body mass index is 46.25 kg/m.  General: He is overweight, cooperative, alert, well developed, and in no acute distress. PSYCH: Has normal mood, affect and thought process.   Lungs: Normal breathing effort, no conversational dyspnea.  DIAGNOSTIC DATA REVIEWED:  BMET    Component Value Date/Time   NA 143 10/20/2022 1122   NA 140 07/31/2022 1029   K 3.7 10/20/2022 1122   CL 104 10/20/2022 1122   CO2 27 10/20/2022 1122   GLUCOSE 129 (H) 10/20/2022 1122   BUN 16 10/20/2022 1122   BUN 17 07/31/2022 1029   CREATININE 1.12 10/20/2022 1122   CREATININE 1.25 08/23/2020 1002   CALCIUM 8.4 (L) 10/20/2022 1122   GFRNONAA >60 10/20/2022 1122   GFRAA >60 10/15/2019 1513   Lab Results  Component Value Date   HGBA1C 7.2 (H) 07/31/2022   HGBA1C 7.8 (H) 03/20/2016   Lab Results  Component Value Date   INSULIN 36.2 (  H) 07/31/2022   Lab Results  Component Value Date   TSH 1.560 07/31/2022   CBC    Component Value Date/Time   WBC 9.5 10/20/2022 1122   RBC 4.40 10/20/2022 1122   HGB 14.4 10/20/2022 1122   HGB 14.9 07/31/2022 1029   HCT 43.4 10/20/2022 1122   HCT 45.1 07/31/2022 1029   PLT 235 10/20/2022 1122   PLT 229 07/31/2022 1029   MCV 98.6 10/20/2022 1122   MCV 98 (H) 07/31/2022 1029   MCH 32.7 10/20/2022 1122   MCHC 33.2 10/20/2022 1122   RDW 14.9 10/20/2022 1122   RDW 14.0 07/31/2022 1029   Iron Studies No results found for: "IRON", "TIBC", "FERRITIN", "IRONPCTSAT" Lipid Panel     Component Value Date/Time   CHOL 145  07/31/2022 1029   TRIG 103 07/31/2022 1029   HDL 32 (L) 07/31/2022 1029   CHOLHDL 4.5 07/31/2022 1029   CHOLHDL 4 10/03/2021 1444   VLDL 16.8 10/03/2021 1444   LDLCALC 94 07/31/2022 1029   LDLCALC 96 08/23/2020 1002   Hepatic Function Panel     Component Value Date/Time   PROT 7.5 10/20/2022 1122   PROT 7.1 07/31/2022 1029   ALBUMIN 3.9 10/20/2022 1122   ALBUMIN 3.9 07/31/2022 1029   AST 14 (L) 10/20/2022 1122   ALT 12 10/20/2022 1122   ALKPHOS 97 10/20/2022 1122   BILITOT 0.9 10/20/2022 1122   BILITOT 0.4 07/31/2022 1029   BILIDIR 0.2 12/20/2017 1356   IBILI 1.0 (H) 12/20/2017 1356      Component Value Date/Time   TSH 1.560 07/31/2022 1029   Nutritional Lab Results  Component Value Date   VD25OH 9.4 (L) 07/31/2022     ASSESSMENT AND PLAN  1. Type 2 diabetes mellitus with hyperglycemia, with long-term current use of insulin (HCC) Type 2 Diabetes Mellitus with other specified complication, with long-term current use of insulin HgbA1c is not at goal. Last A1c was 7.2.  Medication(s): Mounjaro on hold.  Was on Insulin 70/30.  Working on nutrition plan to decrease simple carbohydrates, increase lean proteins and exercise to promote weight loss and improve glycemic control.   Lab Results  Component Value Date   HGBA1C 7.2 (H) 07/31/2022   HGBA1C 6.8 (A) 04/03/2022   HGBA1C 7.4 (H) 10/03/2021   Lab Results  Component Value Date   MICROALBUR 3.6 (H) 10/03/2021   LDLCALC 94 07/31/2022   CREATININE 1.12 10/20/2022   Lab Results  Component Value Date   GFR 79.16 10/03/2021   GFR 80.21 06/16/2019   GFR 92.95 06/10/2018  Will follow up in 2 weeks and if doing well with intake, not skipping meals and meeting protein goals, will likely resume Mounjaro.   2. Vitamin D deficiency Vitamin D Deficiency Vitamin D is not at goal of 50.  Most recent vitamin D level was 9.4. He is on  prescription ergocalciferol 50,000 IU twice weekly. Lab Results  Component Value Date    VD25OH 9.4 (L) 07/31/2022    Plan: Continue prescription vitamin D 50,000 IU weekly. Will recheck Vitamin D level at least 2-3 times yearly to avoid over supplementation.    3. Obesity ([place])- Start BMI 47.87 4. Current BMI 46.3  TREATMENT PLAN FOR OBESITY:  Recommended Dietary Goals  Barclay is currently in the action stage of change. As such, his goal is to continue weight management plan. He has agreed to the Category 4 Plan.  Behavioral Intervention  We discussed the following Behavioral Modification Strategies today: increasing lean  protein intake, decreasing simple carbohydrates , avoiding skipping meals, increasing water intake, work on meal planning and easy cooking plans, and ways to avoid night time snacking.  Additional resources provided today: NA  Recommended Physical Activity Goals  Siris has been advised to work up to 150 minutes of moderate intensity aerobic activity a week and strengthening exercises 2-3 times per week for cardiovascular health, weight loss maintenance and preservation of muscle mass.   He has agreed to increase physical activity in their day and reduce sedentary time (increase NEAT).  and continue physical activity as is.    Pharmacotherapy We discussed various medication options to help Stokely with his weight loss efforts and we both agreed to continue his nutrition plan to promote weight loss. May resume Mounjaro next visit if meeting goals/not skipping meals.   ASSOCIATED CONDITIONS ADDRESSED TODAY     No follow-ups on file.Marland Kitchen He was informed of the importance of frequent follow up visits to maximize his success with intensive lifestyle modifications for his multiple health conditions.   ATTESTASTION STATEMENTS:  Reviewed by clinician on day of visit: allergies, medications, problem list, medical history, surgical history, family history, social history, and previous encounter notes.   I have personally spent 30 minutes total time  today in preparation, patient care, nutritional counseling and documentation for this visit, including the following: review of clinical lab tests; review of medical tests/procedures/services.      Bryor Rami, PA-C

## 2023-01-09 ENCOUNTER — Encounter: Payer: Self-pay | Admitting: Family Medicine

## 2023-01-09 ENCOUNTER — Ambulatory Visit (INDEPENDENT_AMBULATORY_CARE_PROVIDER_SITE_OTHER): Payer: BC Managed Care – PPO | Admitting: Family Medicine

## 2023-01-09 VITALS — BP 135/78 | HR 65 | Ht 75.0 in

## 2023-01-09 DIAGNOSIS — R600 Localized edema: Secondary | ICD-10-CM | POA: Diagnosis not present

## 2023-01-09 DIAGNOSIS — Z23 Encounter for immunization: Secondary | ICD-10-CM | POA: Diagnosis not present

## 2023-01-09 DIAGNOSIS — R059 Cough, unspecified: Secondary | ICD-10-CM

## 2023-01-09 DIAGNOSIS — G4733 Obstructive sleep apnea (adult) (pediatric): Secondary | ICD-10-CM | POA: Diagnosis not present

## 2023-01-09 DIAGNOSIS — I152 Hypertension secondary to endocrine disorders: Secondary | ICD-10-CM

## 2023-01-09 DIAGNOSIS — E1159 Type 2 diabetes mellitus with other circulatory complications: Secondary | ICD-10-CM | POA: Diagnosis not present

## 2023-01-09 NOTE — Assessment & Plan Note (Signed)
He is now agreeable to wear CPAP.  Will place referral for sleep study.  We have previously referred him to pulmonology and he can discuss with them at his visit.

## 2023-01-09 NOTE — Assessment & Plan Note (Signed)
Symptoms have been persistent.  We did refer to pulmonology several months ago for this however he was not able to schedule an appointment.  We gave contact information to their office and instructed patient to call to schedule an appointment ASAP.

## 2023-01-09 NOTE — Progress Notes (Signed)
   Luis [name] is a 58 y.o. male who presents today for an office visit.  Assessment/Plan:  Chronic Problems Addressed Today: Bilateral edema of lower extremity Longstanding issue for patient vesting to be worsening recently.  He is on torsemide 10 mg twice daily.  He is not sure if this helping but does feel like the torsemide is causing frequent urination.  We did discuss potential this may represent lymphedema and discussed referral to vascular surgery for vascular studies however he deferred for now.  We discussed conservative management including salt avoidance, compression stockings, and leg elevation.  He will follow-up with me in a couple of months.  If symptoms persist would consider referral to vascular surgery.  Hypertension associated with diabetes ([place]) Blood pressure at goal on Coreg 25 mg twice daily.  OSA (obstructive sleep apnea), mild, not using CPAP He is now agreeable to wear CPAP.  Will place referral for sleep study.  We have previously referred him to pulmonology and he can discuss with them at his visit.  Cough Symptoms have been persistent.  We did refer to pulmonology several months ago for this however he was not able to schedule an appointment.  We gave contact information to their office and instructed patient to call to schedule an appointment ASAP.  Preventative Healthcare Tdap and Shingrix given today.  He will drop off transportation assistance form in the next week or 2.    Subjective:  HPI:  See A/p for status of chronic conditions.  His main concern today is leg swelling. This has been a chronic problem for years but has been worsening recently. He has stopped taking his spironolactone due to increased urination. He stopped this "awhile" ago. HE did not think it was helping much with the swelling. He has been compliant with torsemide 10mg  twice daily. He does feel like this is making him urinate.  Swelling has become to the point where it is  uncomfortable.  No orthopnea.  No shortness of breath.  He has also had ongoing cough for months.  Comes and goes.  We referred him to pulmonology for this several months ago however he he was not able to follow-up with them.  His sister is also concerned about sleep apnea.  He was diagnosed with this 7 years ago but never went through with getting CPAP machine.  He is now agreeable to have a CPAP and would like to be referred for this.  He also needs assistance with getting approved for [employer] transportation.       Objective:  Physical Exam: BP 135/78   Pulse 65   Ht 6\' 3"  (1.905 m)   SpO2 95%   BMI 46.25 kg/m   Wt Readings from Last 3 Encounters:  01/05/23 (!) 370 lb (167.8 kg)  11/06/22 (!) 381 lb (172.8 kg)  10/20/22 (!) 375 lb (170.1 kg)    Gen: No acute distress, resting comfortably CV: Regular rate and rhythm with no murmurs appreciated Pulm: Normal work of breathing, clear to auscultation bilaterally with no crackles, wheezes, or rhonchi MSK: - Legs: Bilateral venous stasis changes noted.  3+ edema to knees bilaterally. Neuro: Grossly normal, moves all extremities Psych: Normal affect and thought content      Luis Rohl M. [name], MD 01/09/2023 8:20 AM

## 2023-01-09 NOTE — Assessment & Plan Note (Signed)
Longstanding issue for patient vesting to be worsening recently.  He is on torsemide 10 mg twice daily.  He is not sure if this helping but does feel like the torsemide is causing frequent urination.  We did discuss potential this may represent lymphedema and discussed referral to vascular surgery for vascular studies however he deferred for now.  We discussed conservative management including salt avoidance, compression stockings, and leg elevation.  He will follow-up with me in a couple of months.  If symptoms persist would consider referral to vascular surgery.

## 2023-01-09 NOTE — Assessment & Plan Note (Signed)
Blood pressure at goal on Coreg 25 mg twice daily.

## 2023-01-09 NOTE — Patient Instructions (Signed)
It was very nice to see you today!  Please try keeping your legs elevated, avoiding salt, and using compression stockings.  Let me know if the swelling is not improving.  Please call to schedule an appointment with pulmonologist at [phone].  Please come back soon for your annual physical.  Take care, Dr [name]  PLEASE NOTE:  If you had any lab tests, please let us know if you have not heard back within a few days. You may see your results on mychart before we have a chance to review them but we will give you a call once they are reviewed by [place].   If we ordered any referrals today, please let us know if you have not heard from their office within the next week.   If you had any urgent prescriptions sent in today, please check with the pharmacy within an hour of our visit to make sure the prescription was transmitted appropriately.   Please try these tips to maintain a healthy lifestyle:  Eat at least 3 REAL meals and 1-2 snacks per day.  Aim for no more than 5 hours between eating.  If you eat breakfast, please do so within one hour of getting up.   Each meal should contain half fruits/vegetables, one quarter protein, and one quarter carbs (no bigger than a computer mouse)  Cut down on sweet beverages. This includes juice, soda, and sweet tea.   Drink at least 1 glass of water with each meal and aim for at least 8 glasses per day  Exercise at least 150 minutes every week.

## 2023-01-23 ENCOUNTER — Other Ambulatory Visit: Payer: Self-pay | Admitting: Family Medicine

## 2023-01-26 ENCOUNTER — Telehealth: Payer: Self-pay | Admitting: Family Medicine

## 2023-01-26 ENCOUNTER — Ambulatory Visit (INDEPENDENT_AMBULATORY_CARE_PROVIDER_SITE_OTHER): Payer: BC Managed Care – PPO | Admitting: Physician Assistant

## 2023-01-26 ENCOUNTER — Encounter (INDEPENDENT_AMBULATORY_CARE_PROVIDER_SITE_OTHER): Payer: Self-pay | Admitting: Physician Assistant

## 2023-01-26 VITALS — BP 115/72 | HR 76 | Temp 98.4°F | Ht 75.0 in | Wt 371.0 lb

## 2023-01-26 DIAGNOSIS — E1159 Type 2 diabetes mellitus with other circulatory complications: Secondary | ICD-10-CM | POA: Diagnosis not present

## 2023-01-26 DIAGNOSIS — Z794 Long term (current) use of insulin: Secondary | ICD-10-CM

## 2023-01-26 DIAGNOSIS — E1165 Type 2 diabetes mellitus with hyperglycemia: Secondary | ICD-10-CM | POA: Diagnosis not present

## 2023-01-26 DIAGNOSIS — I152 Hypertension secondary to endocrine disorders: Secondary | ICD-10-CM

## 2023-01-26 DIAGNOSIS — Z6841 Body Mass Index (BMI) 40.0 and over, adult: Secondary | ICD-10-CM | POA: Insufficient documentation

## 2023-01-26 MED ORDER — TIRZEPATIDE 2.5 MG/0.5ML ~~LOC~~ SOAJ: 2.5000 mg | SUBCUTANEOUS | 0 refills | Status: DC

## 2023-01-26 NOTE — Progress Notes (Signed)
Office: [phone]  /  Fax: [phone]  WEIGHT SUMMARY AND BIOMETRICS  Vitals Temp: 98.4 F (36.9 C) BP: 115/72 Pulse Rate: 76 SpO2: 96 %   Anthropometric Measurements Height: 6\' 3"  (1.905 m) Weight: (!) 371 lb (168.3 kg) BMI (Calculated): 46.37 Weight at Last Visit: 370 lb Weight Lost Since Last Visit: 0 lb Weight Gained Since Last Visit: 1 lb Starting Weight: 383 lb Total Weight Loss (lbs): 13 lb (5.897 kg)   Body Composition  Body Fat %: 43 % Fat Mass (lbs): 159.8 lbs Muscle Mass (lbs): 201.4 lbs Total Body Water (lbs): 170 lbs   Other Clinical Data Fasting: No Labs: No Today's Visit #: 5 Starting Date: 07/31/22     HPI  Chief Complaint: OBESITY  Luis [name] is here to discuss his progress with his obesity treatment plan. He is on the the Category 4 Plan and states he is following his eating plan approximately 75 % of the time. He states he is exercising 0 minutes 0 times per week.   Interval History:  Since last office visit he has maintained.  He has been having problems with lower extremity edema- Saw PCP and on torsemide 10 MG twice daily and plans to begin wearing compression stockings. We discussed watching sodium in foods and discussed making sure to read labels  He is interested in getting a pedaler to use at his desk as he sits for long periods for work.    Pharmacotherapy: On Mounjaro in past, but was skipping meals, so it was discontinued. Now understands the importance of eating regularly and role of insulin resistance in appetite and how medication works and would like to resume medication with close supervision.   PHYSICAL EXAM:  Blood pressure 115/72, pulse 76, temperature 98.4 F (36.9 C), height 6\' 3"  (1.905 m), weight (!) 371 lb (168.3 kg), SpO2 96 %. Body mass index is 46.37 kg/m.  General: He is overweight, cooperative, alert, well developed, and in no acute distress. PSYCH: Has normal mood, affect and thought process.    Lungs: Normal breathing effort, no conversational dyspnea.  DIAGNOSTIC DATA REVIEWED:  BMET    Component Value Date/Time   NA 143 10/20/2022 1122   NA 140 07/31/2022 1029   K 3.7 10/20/2022 1122   CL 104 10/20/2022 1122   CO2 27 10/20/2022 1122   GLUCOSE 129 (H) 10/20/2022 1122   BUN 16 10/20/2022 1122   BUN 17 07/31/2022 1029   CREATININE 1.12 10/20/2022 1122   CREATININE 1.25 08/23/2020 1002   CALCIUM 8.4 (L) 10/20/2022 1122   GFRNONAA >60 10/20/2022 1122   GFRAA >60 10/15/2019 1513   Lab Results  Component Value Date   HGBA1C 7.2 (H) 07/31/2022   HGBA1C 7.8 (H) 03/20/2016   Lab Results  Component Value Date   INSULIN 36.2 (H) 07/31/2022   Lab Results  Component Value Date   TSH 1.560 07/31/2022   CBC    Component Value Date/Time   WBC 9.5 10/20/2022 1122   RBC 4.40 10/20/2022 1122   HGB 14.4 10/20/2022 1122   HGB 14.9 07/31/2022 1029   HCT 43.4 10/20/2022 1122   HCT 45.1 07/31/2022 1029   PLT 235 10/20/2022 1122   PLT 229 07/31/2022 1029   MCV 98.6 10/20/2022 1122   MCV 98 (H) 07/31/2022 1029   MCH 32.7 10/20/2022 1122   MCHC 33.2 10/20/2022 1122   RDW 14.9 10/20/2022 1122   RDW 14.0 07/31/2022 1029   Iron Studies No results found for: "IRON", "TIBC", "  FERRITIN", "IRONPCTSAT" Lipid Panel     Component Value Date/Time   CHOL 145 07/31/2022 1029   TRIG 103 07/31/2022 1029   HDL 32 (L) 07/31/2022 1029   CHOLHDL 4.5 07/31/2022 1029   CHOLHDL 4 10/03/2021 1444   VLDL 16.8 10/03/2021 1444   LDLCALC 94 07/31/2022 1029   LDLCALC 96 08/23/2020 1002   Hepatic Function Panel     Component Value Date/Time   PROT 7.5 10/20/2022 1122   PROT 7.1 07/31/2022 1029   ALBUMIN 3.9 10/20/2022 1122   ALBUMIN 3.9 07/31/2022 1029   AST 14 (L) 10/20/2022 1122   ALT 12 10/20/2022 1122   ALKPHOS 97 10/20/2022 1122   BILITOT 0.9 10/20/2022 1122   BILITOT 0.4 07/31/2022 1029   BILIDIR 0.2 12/20/2017 1356   IBILI 1.0 (H) 12/20/2017 1356      Component Value  Date/Time   TSH 1.560 07/31/2022 1029   Nutritional Lab Results  Component Value Date   VD25OH 9.4 (L) 07/31/2022    ASSOCIATED CONDITIONS ADDRESSED TODAY  ASSESSMENT AND PLAN  Problem List Items Addressed This Visit     Morbid obesity ([place])   Relevant Medications   tirzepatide (MOUNJARO) 2.5 MG/0.5ML Pen   BMI 45.0-49.9, adult ([place]) Current BMI 46.4   Relevant Medications   tirzepatide (MOUNJARO) 2.5 MG/0.5ML Pen   Hypertension associated with diabetes ([place]) (Chronic)    Hypertension Hypertension well controlled and no significant medication side effects noted.  Medication(s): carvedilol 25 mg daily   Torsemide 10 mg twice daily for edema  BP Readings from Last 3 Encounters:  01/26/23 115/72  01/09/23 135/78  01/05/23 128/82   Lab Results  Component Value Date   CREATININE 1.12 10/20/2022   CREATININE 1.18 08/07/2022   CREATININE 1.10 07/31/2022   Lab Results  Component Value Date   GFR 79.16 10/03/2021   GFR 80.21 06/16/2019   GFR 92.95 06/10/2018   Plan: Continue all antihypertensives at current dosages. Continue to work on nutrition plan to promote weight loss and improve BP control and control peripheral edema/lower extremity edema. Has obtained compression stocking and plans to start wearing .         Relevant Medications   simvastatin (ZOCOR) 20 MG tablet   tirzepatide (MOUNJARO) 2.5 MG/0.5ML Pen   Diabetes (HCC) - Primary (Chronic)    Type 2 Diabetes Mellitus with other specified complication, with long-term current use of insulin HgbA1c is not at goal. Last A1c was 7.2 CBGs: Fasting 120's PPD 150's Episodes of hypoglycemia: no Medication(s):  Insulin 70/30 24 units twice daily.  We discussed resuming Mounjaro at this point as the patient has a better understanding of his nutrition plan and what he needs to do over the next few weeks. We discussed continued close monitoring of blood sugars and use of Mounjaro.  Patient denies a personal or family  history of pancreatitis, medullary thyroid carcinoma or multiple endocrine neoplasia type II.   Lab Results  Component Value Date   HGBA1C 7.2 (H) 07/31/2022   HGBA1C 6.8 (A) 04/03/2022   HGBA1C 7.4 (H) 10/03/2021   Lab Results  Component Value Date   MICROALBUR 3.6 (H) 10/03/2021   LDLCALC 94 07/31/2022   CREATININE 1.12 10/20/2022   Lab Results  Component Value Date   GFR 79.16 10/03/2021   GFR 80.21 06/16/2019   GFR 92.95 06/10/2018   Plan: Start Mounjaro 2.5 mg SQ weekly and monitor closely. Patient understands not to skip meals and to focus on getting in adequate protein and  calories to be successful. Will have early follow up in 3 weeks.        Relevant Medications   simvastatin (ZOCOR) 20 MG tablet   tirzepatide (MOUNJARO) 2.5 MG/0.5ML Pen      TREATMENT PLAN FOR OBESITY:  Recommended Dietary Goals  Nio is currently in the action stage of change. As such, his goal is to continue weight management plan. He has agreed to the Category 4 Plan.  Behavioral Intervention  We discussed the following Behavioral Modification Strategies today: increasing lean protein intake, decreasing simple carbohydrates , increasing vegetables, increasing water intake, work on meal planning and easy cooking plans, reading food labels , identifying sources and decreasing liquid calories, planning for success, and keeping healthy foods at home.  Additional resources provided today: NA  Recommended Physical Activity Goals  Kison has been advised to work up to 150 minutes of moderate intensity aerobic activity a week and strengthening exercises 2-3 times per week for cardiovascular health, weight loss maintenance and preservation of muscle mass.   He has agreed to Continue current level of physical activity    Pharmacotherapy We discussed various medication options to help Arhum with his weight loss efforts and we both agreed to resume Mounjaro for Type 2 diabetes.     Return in about 3 weeks (around 02/16/2023).Marland Kitchen He was informed of the importance of frequent follow up visits to maximize his success with intensive lifestyle modifications for his multiple health conditions.   ATTESTASTION STATEMENTS:  Reviewed by clinician on day of visit: allergies, medications, problem list, medical history, surgical history, family history, social history, and previous encounter notes.   I have personally spent 30 minutes total time today in preparation, patient care, nutritional counseling and documentation for this visit, including the following: review of clinical lab tests; review of medical tests/procedures/services.      Rhyan Radler, PA-C

## 2023-01-26 NOTE — Telephone Encounter (Signed)
Pt states he needs refill on  torsemide (DEMADEX) 10 MG tablet  But RX was denied. Please advise.

## 2023-01-26 NOTE — Assessment & Plan Note (Signed)
Type 2 Diabetes Mellitus with other specified complication, with long-term current use of insulin HgbA1c is not at goal. Last A1c was 7.2 CBGs: Fasting 120's PPD 150's Episodes of hypoglycemia: no Medication(s):  Insulin 70/30 24 units twice daily.  We discussed resuming Mounjaro at this point as the patient has a better understanding of his nutrition plan and what he needs to do over the next few weeks. We discussed continued close monitoring of blood sugars and use of Mounjaro.  Patient denies a personal or family history of pancreatitis, medullary thyroid carcinoma or multiple endocrine neoplasia type II.   Lab Results  Component Value Date   HGBA1C 7.2 (H) 07/31/2022   HGBA1C 6.8 (A) 04/03/2022   HGBA1C 7.4 (H) 10/03/2021   Lab Results  Component Value Date   MICROALBUR 3.6 (H) 10/03/2021   LDLCALC 94 07/31/2022   CREATININE 1.12 10/20/2022   Lab Results  Component Value Date   GFR 79.16 10/03/2021   GFR 80.21 06/16/2019   GFR 92.95 06/10/2018    Plan: Start Mounjaro 2.5 mg SQ weekly and monitor closely. Patient understands not to skip meals and to focus on getting in adequate protein and calories to be successful. Will have early follow up in 3 weeks.

## 2023-01-26 NOTE — Assessment & Plan Note (Signed)
Hypertension Hypertension well controlled and no significant medication side effects noted.  Medication(s): carvedilol 25 mg daily   Torsemide 10 mg twice daily for edema  BP Readings from Last 3 Encounters:  01/26/23 115/72  01/09/23 135/78  01/05/23 128/82   Lab Results  Component Value Date   CREATININE 1.12 10/20/2022   CREATININE 1.18 08/07/2022   CREATININE 1.10 07/31/2022   Lab Results  Component Value Date   GFR 79.16 10/03/2021   GFR 80.21 06/16/2019   GFR 92.95 06/10/2018    Plan: Continue all antihypertensives at current dosages. Continue to work on nutrition plan to promote weight loss and improve BP control and control peripheral edema/lower extremity edema. Has obtained compression stocking and plans to start wearing .

## 2023-01-26 NOTE — Telephone Encounter (Signed)
.  h 

## 2023-01-27 ENCOUNTER — Other Ambulatory Visit: Payer: Self-pay | Admitting: *Deleted

## 2023-01-27 MED ORDER — TORSEMIDE 10 MG PO TABS: 10.0000 mg | ORAL_TABLET | Freq: Two times a day (BID) | ORAL | 1 refills | Status: DC

## 2023-01-28 ENCOUNTER — Telehealth (INDEPENDENT_AMBULATORY_CARE_PROVIDER_SITE_OTHER): Payer: Self-pay | Admitting: Physician Assistant

## 2023-01-28 NOTE — Telephone Encounter (Signed)
PA for [facility] submitted today.  Waiting for determination.

## 2023-02-06 ENCOUNTER — Encounter: Payer: BC Managed Care – PPO | Admitting: Family Medicine

## 2023-02-07 ENCOUNTER — Other Ambulatory Visit: Payer: Self-pay | Admitting: Family Medicine

## 2023-02-07 DIAGNOSIS — E876 Hypokalemia: Secondary | ICD-10-CM

## 2023-02-16 ENCOUNTER — Encounter (INDEPENDENT_AMBULATORY_CARE_PROVIDER_SITE_OTHER): Payer: Self-pay | Admitting: Physician Assistant

## 2023-02-16 ENCOUNTER — Ambulatory Visit (INDEPENDENT_AMBULATORY_CARE_PROVIDER_SITE_OTHER): Payer: BC Managed Care – PPO | Admitting: Physician Assistant

## 2023-02-16 VITALS — BP 110/68 | HR 75 | Temp 98.2°F | Ht 75.0 in | Wt 368.0 lb

## 2023-02-16 DIAGNOSIS — R600 Localized edema: Secondary | ICD-10-CM | POA: Diagnosis not present

## 2023-02-16 DIAGNOSIS — E1165 Type 2 diabetes mellitus with hyperglycemia: Secondary | ICD-10-CM | POA: Diagnosis not present

## 2023-02-16 DIAGNOSIS — I152 Hypertension secondary to endocrine disorders: Secondary | ICD-10-CM

## 2023-02-16 DIAGNOSIS — E1159 Type 2 diabetes mellitus with other circulatory complications: Secondary | ICD-10-CM

## 2023-02-16 DIAGNOSIS — Z6841 Body Mass Index (BMI) 40.0 and over, adult: Secondary | ICD-10-CM

## 2023-02-16 DIAGNOSIS — Z794 Long term (current) use of insulin: Secondary | ICD-10-CM

## 2023-02-16 DIAGNOSIS — Z7985 Long-term (current) use of injectable non-insulin antidiabetic drugs: Secondary | ICD-10-CM

## 2023-02-16 MED ORDER — TIRZEPATIDE 5 MG/0.5ML ~~LOC~~ SOAJ: 5.0000 mg | SUBCUTANEOUS | 0 refills | Status: DC

## 2023-02-16 NOTE — Assessment & Plan Note (Signed)
Lower extremity edema improved overall. Using compression intermittently and notes it helps when using.  Trying to be more mindful of sodium content of food items and increasing water intake.   Plan: Continue to work on nutrition plan and exercise, use compression to promote weight loss and improve leg edema.

## 2023-02-16 NOTE — Assessment & Plan Note (Addendum)
Type 2 Diabetes Mellitus with other specified complication, with long-term current use of insulin HgbA1c is not at goal. Last A1c was 7.2- much improved from previous.  CBGs: Fasting 120-150's Episodes of hypoglycemia: no Medication(s): Mounjaro 2.5 mg SQ weekly No N/V/Diarrhea or constipation. No neck mass, no difficulty swallowing. No abdominal pain. Mood stable.  Novolog insulin 22 units twice daily unless FBS is < 120. No hypoglycemia.   Lab Results  Component Value Date   HGBA1C 7.2 (H) 07/31/2022   HGBA1C 6.8 (A) 04/03/2022   HGBA1C 7.4 (H) 10/03/2021   Lab Results  Component Value Date   MICROALBUR 3.6 (H) 10/03/2021   LDLCALC 94 07/31/2022   CREATININE 1.12 10/20/2022   Lab Results  Component Value Date   GFR 79.16 10/03/2021   GFR 80.21 06/16/2019   GFR 92.95 06/10/2018    Plan: Continue and increase dose Mounjaro 5.0 mg SQ weekly  Continue to work on nutrition plan and exercise to promote weight loss and improve glycemic control.  Will plan to recheck fasting labs next visit.

## 2023-02-16 NOTE — Assessment & Plan Note (Signed)
Hypertension Hypertension well controlled, improved, asymptomatic, and no significant medication side effects noted.  Medication(s): carvedilol 25 mg twice daily.  Demadex 10 mg twice daily for leg edema.    BP Readings from Last 3 Encounters:  02/16/23 110/68  01/26/23 115/72  01/09/23 135/78   Lab Results  Component Value Date   CREATININE 1.12 10/20/2022   CREATININE 1.18 08/07/2022   CREATININE 1.10 07/31/2022   Lab Results  Component Value Date   GFR 79.16 10/03/2021   GFR 80.21 06/16/2019   GFR 92.95 06/10/2018    Plan: Continue all antihypertensives at current dosages. Continue to work on nutrition plan to promote weight loss and improve BP control.

## 2023-02-16 NOTE — Progress Notes (Signed)
Office: [phone]  /  Fax: [phone]  WEIGHT SUMMARY AND BIOMETRICS  Vitals Temp: 98.2 F (36.8 C) BP: 110/68 Pulse Rate: 75 SpO2: 95 %   Anthropometric Measurements Height:  (1.905 m) Weight: (!) 368 lb (166.9 kg) BMI (Calculated): 46 Weight at Last Visit: 371 lb Weight Lost Since Last Visit: 3 lb Weight Gained Since Last Visit: 0 lb Starting Weight: 383 lb Total Weight Loss (lbs): 15 lb (6.804 kg)   Body Composition  Body Fat %: 44.7 % Fat Mass (lbs): 164.8 lbs Muscle Mass (lbs): 193.8 lbs Visceral Fat Rating : 30   Other Clinical Data Fasting: no Labs: no Starting Date: 07/31/22     HPI  Chief Complaint: OBESITY  Luis [name] is here to discuss his progress with his obesity treatment plan. He is on the the Category 4 Plan and states he is following his eating plan approximately 85 % of the time. He states he is exercising 0 minutes 0 times per week.   Interval History:  Since last office visit he is down 3 lbs. He has given up drinking [place] Punch and is drinking mostly water or sugar free juices.  His lower extremity edema is improved and he is wearing compression stockings more consistently now. He wants to increase is walking as the weather gets warmer.  Hunger and appetite- mostly controlled and not skipping meals.    Pharmacotherapy: Mounjaro 2.5 mg weekly ( Tuesdays and we discussed moving to mid week to cover the weekends better when he has less structure) No side effects with Mounjaro.   PHYSICAL EXAM:  Blood pressure 110/68, pulse 75, temperature 98.2 F (36.8 C), height  (1.905 m), weight (!) 368 lb (166.9 kg), SpO2 95 %. Body mass index is 46 kg/m.  General: He is overweight, cooperative, alert, well developed, and in no acute distress. PSYCH: Has normal mood, affect and thought process.   Cardiovascular: regular rhythm, HR 70's Lungs: Normal breathing effort, no conversational dyspnea. Neuro: no focal  deficits  DIAGNOSTIC DATA REVIEWED:  BMET    Component Value Date/Time   NA 143 10/20/2022 1122   NA 140 07/31/2022 1029   K 3.7 10/20/2022 1122   CL 104 10/20/2022 1122   CO2 27 10/20/2022 1122   GLUCOSE 129 (H) 10/20/2022 1122   BUN 16 10/20/2022 1122   BUN 17 07/31/2022 1029   CREATININE 1.12 10/20/2022 1122   CREATININE 1.25 08/23/2020 1002   CALCIUM 8.4 (L) 10/20/2022 1122   GFRNONAA >60 10/20/2022 1122   GFRAA >60 10/15/2019 1513   Lab Results  Component Value Date   HGBA1C 7.2 (H) 07/31/2022   HGBA1C 7.8 (H) 03/20/2016   Lab Results  Component Value Date   INSULIN 36.2 (H) 07/31/2022   Lab Results  Component Value Date   TSH 1.560 07/31/2022   CBC    Component Value Date/Time   WBC 9.5 10/20/2022 1122   RBC 4.40 10/20/2022 1122   HGB 14.4 10/20/2022 1122   HGB 14.9 07/31/2022 1029   HCT 43.4 10/20/2022 1122   HCT 45.1 07/31/2022 1029   PLT 235 10/20/2022 1122   PLT 229 07/31/2022 1029   MCV 98.6 10/20/2022 1122   MCV 98 (H) 07/31/2022 1029   MCH 32.7 10/20/2022 1122   MCHC 33.2 10/20/2022 1122   RDW 14.9 10/20/2022 1122   RDW 14.0 07/31/2022 1029   Iron Studies No results found for: "IRON", "TIBC", "FERRITIN", "IRONPCTSAT" Lipid Panel     Component Value Date/Time  CHOL 145 07/31/2022 1029   TRIG 103 07/31/2022 1029   HDL 32 (L) 07/31/2022 1029   CHOLHDL 4.5 07/31/2022 1029   CHOLHDL 4 10/03/2021 1444   VLDL 16.8 10/03/2021 1444   LDLCALC 94 07/31/2022 1029   LDLCALC 96 08/23/2020 1002   Hepatic Function Panel     Component Value Date/Time   PROT 7.5 10/20/2022 1122   PROT 7.1 07/31/2022 1029   ALBUMIN 3.9 10/20/2022 1122   ALBUMIN 3.9 07/31/2022 1029   AST 14 (L) 10/20/2022 1122   ALT 12 10/20/2022 1122   ALKPHOS 97 10/20/2022 1122   BILITOT 0.9 10/20/2022 1122   BILITOT 0.4 07/31/2022 1029   BILIDIR 0.2 12/20/2017 1356   IBILI 1.0 (H) 12/20/2017 1356      Component Value Date/Time   TSH 1.560 07/31/2022 1029    Nutritional Lab Results  Component Value Date   VD25OH 9.4 (L) 07/31/2022    ASSOCIATED CONDITIONS ADDRESSED TODAY  ASSESSMENT AND PLAN  Problem List Items Addressed This Visit     Bilateral edema of lower extremity    Lower extremity edema improved overall. Using compression intermittently and notes it helps when using.  Trying to be more mindful of sodium content of food items and increasing water intake.   Plan: Continue to work on nutrition plan and exercise, use compression to promote weight loss and improve leg edema.       Morbid obesity   Relevant Medications   tirzepatide (MOUNJARO) 5 MG/0.5ML Pen   BMI 45.0-49.9, adult (HCC) Current BMI 46.4   Relevant Medications   tirzepatide [facility]) 5 MG/0.5ML Pen   Hypertension associated with diabetes (Chronic)    Hypertension Hypertension well controlled, improved, asymptomatic, and no significant medication side effects noted.  Medication(s): carvedilol 25 mg twice daily.  Demadex 10 mg twice daily for leg edema.    BP Readings from Last 3 Encounters:  02/16/23 110/68  01/26/23 115/72  01/09/23 135/78   Lab Results  Component Value Date   CREATININE 1.12 10/20/2022   CREATININE 1.18 08/07/2022   CREATININE 1.10 07/31/2022   Lab Results  Component Value Date   GFR 79.16 10/03/2021   GFR 80.21 06/16/2019   GFR 92.95 06/10/2018   Plan: Continue all antihypertensives at current dosages. Continue to work on nutrition plan to promote weight loss and improve BP control.         Relevant Medications   tirzepatide (MOUNJARO) 5 MG/0.5ML Pen   Diabetes - Primary (Chronic)    Type 2 Diabetes Mellitus with other specified complication, with long-term current use of insulin HgbA1c is not at goal. Last A1c was 7.2- much improved from previous.  CBGs: Fasting 120-150's Episodes of hypoglycemia: no Medication(s): Mounjaro 2.5 mg SQ weekly No N/V/Diarrhea or constipation. No neck mass, no difficulty swallowing. No  abdominal pain. Mood stable.  Novolog insulin 22 units twice daily unless FBS is < 120. No hypoglycemia.   Lab Results  Component Value Date   HGBA1C 7.2 (H) 07/31/2022   HGBA1C 6.8 (A) 04/03/2022   HGBA1C 7.4 (H) 10/03/2021   Lab Results  Component Value Date   MICROALBUR 3.6 (H) 10/03/2021   LDLCALC 94 07/31/2022   CREATININE 1.12 10/20/2022   Lab Results  Component Value Date   GFR 79.16 10/03/2021   GFR 80.21 06/16/2019   GFR 92.95 06/10/2018   Plan: Continue and increase dose Mounjaro 5.0 mg SQ weekly  Continue to work on nutrition plan and exercise to promote weight loss and improve  glycemic control.  Will plan to recheck fasting labs next visit.        Relevant Medications   tirzepatide (MOUNJARO) 5 MG/0.5ML Pen    TREATMENT PLAN FOR OBESITY:  Recommended Dietary Goals  Perley is currently in the action stage of change. As such, his goal is to continue weight management plan. He has agreed to the Category 4 Plan.  Behavioral Intervention  We discussed the following Behavioral Modification Strategies today: increasing lean protein intake, decreasing simple carbohydrates , increasing vegetables, avoiding skipping meals, increasing water intake, reading food labels , decreasing sodium intake, and planning for success.  Additional resources provided today: NA  Recommended Physical Activity Goals  Abhijot has been advised to work up to 150 minutes of moderate intensity aerobic activity a week and strengthening exercises 2-3 times per week for cardiovascular health, weight loss maintenance and preservation of muscle mass.   He has agreed to Continue current level of physical activity  and Think about ways to increase physical activity   Pharmacotherapy We discussed various medication options to help Branon with his weight loss efforts and we both agreed to increase Mounjaro to 5 mg weekly and continue to work on nutritional and behavioral strategies to promote  weight loss.    Return in about 4 weeks (around 03/16/2023) for Fasting Lab.Marland Kitchen He was informed of the importance of frequent follow up visits to maximize his success with intensive lifestyle modifications for his multiple health conditions.   ATTESTASTION STATEMENTS:  Reviewed by clinician on day of visit: allergies, medications, problem list, medical history, surgical history, family history, social history, and previous encounter notes.   I have personally spent 41 minutes total time today in preparation, patient care, nutritional counseling and documentation for this visit, including the following: review of clinical lab tests; review of medical tests/procedures/services.      Torrence Branagan, PA-C

## 2023-02-27 ENCOUNTER — Ambulatory Visit: Payer: BC Managed Care – PPO | Admitting: Cardiology

## 2023-03-16 ENCOUNTER — Other Ambulatory Visit (HOSPITAL_COMMUNITY): Payer: Self-pay

## 2023-03-16 ENCOUNTER — Encounter (INDEPENDENT_AMBULATORY_CARE_PROVIDER_SITE_OTHER): Payer: Self-pay | Admitting: Physician Assistant

## 2023-03-16 ENCOUNTER — Ambulatory Visit (INDEPENDENT_AMBULATORY_CARE_PROVIDER_SITE_OTHER): Payer: BC Managed Care – PPO | Admitting: Physician Assistant

## 2023-03-16 VITALS — BP 114/69 | HR 71 | Temp 98.2°F | Ht 75.0 in | Wt 374.0 lb

## 2023-03-16 DIAGNOSIS — E785 Hyperlipidemia, unspecified: Secondary | ICD-10-CM

## 2023-03-16 DIAGNOSIS — E1169 Type 2 diabetes mellitus with other specified complication: Secondary | ICD-10-CM

## 2023-03-16 DIAGNOSIS — E559 Vitamin D deficiency, unspecified: Secondary | ICD-10-CM

## 2023-03-16 DIAGNOSIS — Z794 Long term (current) use of insulin: Secondary | ICD-10-CM | POA: Diagnosis not present

## 2023-03-16 DIAGNOSIS — E538 Deficiency of other specified B group vitamins: Secondary | ICD-10-CM | POA: Diagnosis not present

## 2023-03-16 DIAGNOSIS — Z6841 Body Mass Index (BMI) 40.0 and over, adult: Secondary | ICD-10-CM

## 2023-03-16 DIAGNOSIS — E1159 Type 2 diabetes mellitus with other circulatory complications: Secondary | ICD-10-CM | POA: Diagnosis not present

## 2023-03-16 DIAGNOSIS — Z7985 Long-term (current) use of injectable non-insulin antidiabetic drugs: Secondary | ICD-10-CM

## 2023-03-16 DIAGNOSIS — R600 Localized edema: Secondary | ICD-10-CM

## 2023-03-16 DIAGNOSIS — E1165 Type 2 diabetes mellitus with hyperglycemia: Secondary | ICD-10-CM | POA: Diagnosis not present

## 2023-03-16 DIAGNOSIS — I152 Hypertension secondary to endocrine disorders: Secondary | ICD-10-CM

## 2023-03-16 MED ORDER — TIRZEPATIDE 2.5 MG/0.5ML ~~LOC~~ SOAJ
2.5000 mg | SUBCUTANEOUS | 0 refills | Status: DC
Start: 2023-03-16 — End: 2023-04-07
  Filled 2023-03-16: qty 2, 28d supply, fill #0

## 2023-03-16 NOTE — Progress Notes (Signed)
.smr  Office: [phone]  /  Fax: [phone]  WEIGHT SUMMARY AND BIOMETRICS  Vitals Temp: 98.2 F (36.8 C) BP: 114/69 Pulse Rate: 71 SpO2: 97 %   Anthropometric Measurements Height: 6\' 3"  (1.905 m) Weight: (!) 374 lb (169.6 kg) BMI (Calculated): 46.75 Weight at Last Visit: 368 lb Weight Lost Since Last Visit: 0 lb Weight Gained Since Last Visit: 6 lb Starting Weight: 383 lb   Body Composition  Body Fat %: 45.4 % Fat Mass (lbs): 170 lbs Muscle Mass (lbs): 194.6 lbs Visceral Fat Rating : 31   Other Clinical Data Fasting: yes Labs: yes Today's Visit #: 8 Starting Date: 07/31/22     HPI  Chief Complaint: OBESITY  Luis [name] is here to discuss his progress with his obesity treatment plan. He is on the the Category 4 Plan and states he is following his eating plan approximately 40 % of the time. He states he is exercising 0 minutes 0 times per week.   Interval History:  Since last office visit he is up 6 lbs.  He has been off Mounjaro for the past 4 weeks due to drug shortage issues.  Reports he got off plan and was drinking regular Pepsi and eating regular bread. He is working to get back on track with his nutrition plan. The Pepsi and bread are gone. We discussed better options.  Hunger/appetite-better control when focusing on plan Cravings- excessive at times Stress- work stress is stable Exercise-No consistent exercise currently and we discussed trying to walk for 10 minutes 3-4 times weekly Hydration-drinks adequate water   Pharmacotherapy: Mounjaro 2.5 mg weekly- but last dose was 4 weeks ago, so will need to stay at 2.5 mg dosing to get back on track. Denies mass in neck, dysphagia, dyspepsia, persistent hoarseness, abdominal pain, or N/V/Constipation or diarrhea. Has annual eye exam. Mood is stable.    TREATMENT PLAN FOR OBESITY:  Recommended Dietary Goals  Luis [name] is currently in the action stage of change. As such, his goal is to continue weight  management plan. He has agreed to the Category 4 Plan.  Behavioral Intervention  We discussed the following Behavioral Modification Strategies today: increasing lean protein intake, decreasing simple carbohydrates , increasing vegetables, increasing lower glycemic fruits, increasing fiber rich foods, increasing water intake, keeping healthy foods at home, identifying sources and decreasing liquid calories, and continue to work on implementation of reduced calorie nutritional plan.  Additional resources provided today: NA  Recommended Physical Activity Goals  Luis [name] has been advised to work up to 150 minutes of moderate intensity aerobic activity a week and strengthening exercises 2-3 times per week for cardiovascular health, weight loss maintenance and preservation of muscle mass.   He has agreed to Think about ways to increase daily physical activity and overcoming barriers to exercise and start walking 10 minutes 3-4 days a week   Pharmacotherapy We discussed various medication options to help Luis [name] with his weight loss efforts and we both agreed to resume/continue Mounjaro 2.5 mg weekly for Type 2 diabetes.     Return in about 3 weeks (around 04/06/2023).Marland Kitchen He was informed of the importance of frequent follow up visits to maximize his success with intensive lifestyle modifications for his multiple health conditions.  PHYSICAL EXAM:  Blood pressure 114/69, pulse 71, temperature 98.2 F (36.8 C), height 6\' 3"  (1.905 m), weight (!) 374 lb (169.6 kg), SpO2 97 %. Body mass index is 46.75 kg/m.  General: He is overweight, cooperative, alert, well developed, and in no acute  distress. PSYCH: Has normal mood, affect and thought process.   Cardiovascular: HR 70's regular, + 1 ankle/pedal edema Lungs: Normal breathing effort, no conversational dyspnea. Neuro: no focal deficits.   DIAGNOSTIC DATA REVIEWED:  BMET    Component Value Date/Time   NA 143 10/20/2022 1122   NA 140 07/31/2022  1029   K 3.7 10/20/2022 1122   CL 104 10/20/2022 1122   CO2 27 10/20/2022 1122   GLUCOSE 129 (H) 10/20/2022 1122   BUN 16 10/20/2022 1122   BUN 17 07/31/2022 1029   CREATININE 1.12 10/20/2022 1122   CREATININE 1.25 08/23/2020 1002   CALCIUM 8.4 (L) 10/20/2022 1122   GFRNONAA >60 10/20/2022 1122   GFRAA >60 10/15/2019 1513   Lab Results  Component Value Date   HGBA1C 7.2 (H) 07/31/2022   HGBA1C 7.8 (H) 03/20/2016   Lab Results  Component Value Date   INSULIN 36.2 (H) 07/31/2022   Lab Results  Component Value Date   TSH 1.560 07/31/2022   CBC    Component Value Date/Time   WBC 9.5 10/20/2022 1122   RBC 4.40 10/20/2022 1122   HGB 14.4 10/20/2022 1122   HGB 14.9 07/31/2022 1029   HCT 43.4 10/20/2022 1122   HCT 45.1 07/31/2022 1029   PLT 235 10/20/2022 1122   PLT 229 07/31/2022 1029   MCV 98.6 10/20/2022 1122   MCV 98 (H) 07/31/2022 1029   MCH 32.7 10/20/2022 1122   MCHC 33.2 10/20/2022 1122   RDW 14.9 10/20/2022 1122   RDW 14.0 07/31/2022 1029   Iron Studies No results found for: "IRON", "TIBC", "FERRITIN", "IRONPCTSAT" Lipid Panel     Component Value Date/Time   CHOL 145 07/31/2022 1029   TRIG 103 07/31/2022 1029   HDL 32 (L) 07/31/2022 1029   CHOLHDL 4.5 07/31/2022 1029   CHOLHDL 4 10/03/2021 1444   VLDL 16.8 10/03/2021 1444   LDLCALC 94 07/31/2022 1029   LDLCALC 96 08/23/2020 1002   Hepatic Function Panel     Component Value Date/Time   PROT 7.5 10/20/2022 1122   PROT 7.1 07/31/2022 1029   ALBUMIN 3.9 10/20/2022 1122   ALBUMIN 3.9 07/31/2022 1029   AST 14 (L) 10/20/2022 1122   ALT 12 10/20/2022 1122   ALKPHOS 97 10/20/2022 1122   BILITOT 0.9 10/20/2022 1122   BILITOT 0.4 07/31/2022 1029   BILIDIR 0.2 12/20/2017 1356   IBILI 1.0 (H) 12/20/2017 1356      Component Value Date/Time   TSH 1.560 07/31/2022 1029   Nutritional Lab Results  Component Value Date   VD25OH 9.4 (L) 07/31/2022    ASSOCIATED CONDITIONS ADDRESSED TODAY  ASSESSMENT  AND PLAN  Problem List Items Addressed This Visit     Morbid obesity (HCC)   Relevant Medications   tirzepatide (MOUNJARO) 2.5 MG/0.5ML Pen   Vitamin D deficiency   Relevant Orders   VITAMIN D 25 Hydroxy (Vit-D Deficiency, Fractures)   B12 deficiency   Relevant Orders   Vitamin B12   CBC with Differential/Platelet   BMI 45.0-49.9, adult (HCC) Current BMI 46.4   Relevant Medications   tirzepatide (MOUNJARO) 2.5 MG/0.5ML Pen   Hypertension associated with diabetes (HCC) (Chronic)   Relevant Medications   tirzepatide (MOUNJARO) 2.5 MG/0.5ML Pen   Hyperlipidemia associated with type 2 diabetes mellitus (HCC) (Chronic)   Relevant Medications   tirzepatide (MOUNJARO) 2.5 MG/0.5ML Pen   Other Relevant Orders   Lipid Panel With LDL/HDL Ratio   Diabetes (HCC) - Primary (Chronic)   Relevant Medications  tirzepatide [facility]) 2.5 MG/0.5ML Pen   Other Relevant Orders   CMP14+EGFR   Hemoglobin A1c   Insulin, random  Type 2 Diabetes Mellitus with other specified complication, without long-term current use of insulin HgbA1c is not at goal. Last A1c was 7.2 CBGs: Not checking Episodes of hypoglycemia: no Medication(s): Mounjaro 2.5 mg SQ weekly Denies mass in neck, dysphagia, dyspepsia, persistent hoarseness, abdominal pain, or N/V/Constipation or diarrhea. Has annual eye exam. Mood is stable.     Lab Results  Component Value Date   HGBA1C 7.2 (H) 07/31/2022   HGBA1C 6.8 (A) 04/03/2022   HGBA1C 7.4 (H) 10/03/2021   Lab Results  Component Value Date   MICROALBUR 3.6 (H) 10/03/2021   LDLCALC 94 07/31/2022   CREATININE 1.12 10/20/2022   Lab Results  Component Value Date   GFR 79.16 10/03/2021   GFR 80.21 06/16/2019   GFR 92.95 06/10/2018    Plan: Continue and refill Mounjaro 2.5 mg SQ weekly Had to resume loading dose as has not been on for the past 4 weeks due to drug shortage.  Continue working on nutrition plan to decrease simple carbohydrates, increase lean proteins  and exercise to promote weight loss,and improve glycemic control. Recheck fasting labs today.   Hypertension Hypertension well controlled, asymptomatic, no significant medication side effects noted, and needs further observation.  Medication(s): Coreg 25 mg daily   Demadex 10 mg twice daily  BP Readings from Last 3 Encounters:  03/16/23 114/69  02/16/23 110/68  01/26/23 115/72   Lab Results  Component Value Date   CREATININE 1.12 10/20/2022   CREATININE 1.18 08/07/2022   CREATININE 1.10 07/31/2022   Lab Results  Component Value Date   GFR 79.16 10/03/2021   GFR 80.21 06/16/2019   GFR 92.95 06/10/2018    Plan: Continue all antihypertensives at current dosages. Continue to work on nutrition plan to promote weight loss and improve BP control.  Recheck labs today.   Hyperlipidemia LDL is not at goal. HDL < 40 Medication(s): Zocor 20 mg daily Cardiovascular risk factors: advanced age (older than 31 for men, 67 for women), diabetes mellitus, dyslipidemia, family history of premature cardiovascular disease, hypertension, male gender, obesity (BMI >= 30 kg/m2), and sedentary lifestyle  Lab Results  Component Value Date   CHOL 145 07/31/2022   HDL 32 (L) 07/31/2022   LDLCALC 94 07/31/2022   TRIG 103 07/31/2022   CHOLHDL 4.5 07/31/2022   CHOLHDL 4 10/03/2021   CHOLHDL 4.1 08/23/2020   Lab Results  Component Value Date   ALT 12 10/20/2022   AST 14 (L) 10/20/2022   ALKPHOS 97 10/20/2022   BILITOT 0.9 10/20/2022   The 10-year ASCVD risk score (Arnett DK, et al., 2019) is: 27.3%   Values used to calculate the score:     Age: 57 years     Sex: Male     Is Non-Hispanic African American: Yes     Diabetic: Yes     Tobacco smoker: Yes     Systolic Blood Pressure: 114 mmHg     Is BP treated: Yes     HDL Cholesterol: 32 mg/dL     Total Cholesterol: 145 mg/dL  Plan: Continue statin. Continue working on nutrition plan to promote weight loss and improve lipid  profile/decrease CV risk.  Recheck fasting labs today.   Vitamin D Deficiency Vitamin D is not at goal of 50.  Most recent vitamin D level was 9.4. He is on none currently. Was on Ergocalciferol 50,000 IU weekly  previously.   Lab Results  Component Value Date   VD25OH 9.4 (L) 07/31/2022    Plan:  Recheck vitamin D level today. Low vitamin D levels can be associated with adiposity and may result in leptin resistance and weight gain. Also associated with fatigue. Currently on vitamin D supplementation without any adverse effects.   B 12 Deficiency:  Endorses fatigue. Last B 12 286 07/31/2022. Not taking B 12 supplement.  Plan: Recheck B 12 level and supplement as indicated.   ATTESTASTION STATEMENTS:  Reviewed by clinician on day of visit: allergies, medications, problem list, medical history, surgical history, family history, social history, and previous encounter notes.   I have personally spent 40 minutes total time today in preparation, patient care, nutritional counseling and documentation for this visit, including the following: review of clinical lab tests; review of medical tests/procedures/services.      Luis Rebert, PA-C

## 2023-03-17 LAB — CMP14+EGFR
ALT: 10 IU/L (ref 0–44)
AST: 15 IU/L (ref 0–40)
Albumin/Globulin Ratio: 1.3 (ref 1.2–2.2)
Albumin: 3.8 g/dL (ref 3.8–4.9)
Alkaline Phosphatase: 127 IU/L — ABNORMAL HIGH (ref 44–121)
BUN/Creatinine Ratio: 12 (ref 9–20)
BUN: 13 mg/dL (ref 6–24)
Bilirubin Total: 0.8 mg/dL (ref 0.0–1.2)
CO2: 23 mmol/L (ref 20–29)
Calcium: 8.6 mg/dL — ABNORMAL LOW (ref 8.7–10.2)
Chloride: 103 mmol/L (ref 96–106)
Creatinine, Ser: 1.13 mg/dL (ref 0.76–1.27)
Globulin, Total: 3 g/dL (ref 1.5–4.5)
Glucose: 129 mg/dL — ABNORMAL HIGH (ref 70–99)
Potassium: 3.7 mmol/L (ref 3.5–5.2)
Sodium: 139 mmol/L (ref 134–144)
Total Protein: 6.8 g/dL (ref 6.0–8.5)
eGFR: 77 mL/min/{1.73_m2} (ref >59)

## 2023-03-17 LAB — HEMOGLOBIN A1C
Est. average glucose Bld gHb Est-mCnc: 154 mg/dL
Hgb A1c MFr Bld: 7 % — ABNORMAL HIGH (ref 4.8–5.6)

## 2023-03-17 LAB — CBC WITH DIFFERENTIAL/PLATELET
Basophils Absolute: 0 10*3/uL (ref 0.0–0.2)
Basos: 1 %
EOS (ABSOLUTE): 0.2 10*3/uL (ref 0.0–0.4)
Eos: 3 %
Hematocrit: 42.2 % (ref 37.5–51.0)
Hemoglobin: 13.9 g/dL (ref 13.0–17.7)
Immature Grans (Abs): 0 10*3/uL (ref 0.0–0.1)
Immature Granulocytes: 0 %
Lymphocytes Absolute: 2 10*3/uL (ref 0.7–3.1)
Lymphs: 31 %
MCH: 31.4 pg (ref 26.6–33.0)
MCHC: 32.9 g/dL (ref 31.5–35.7)
MCV: 95 fL (ref 79–97)
Monocytes Absolute: 0.5 10*3/uL (ref 0.1–0.9)
Monocytes: 7 %
Neutrophils Absolute: 3.9 10*3/uL (ref 1.4–7.0)
Neutrophils: 58 %
Platelets: 229 10*3/uL (ref 150–450)
RBC: 4.43 x10E6/uL (ref 4.14–5.80)
RDW: 14.3 % (ref 11.6–15.4)
WBC: 6.6 10*3/uL (ref 3.4–10.8)

## 2023-03-17 LAB — VITAMIN B12: Vitamin B-12: 285 pg/mL (ref 232–1245)

## 2023-03-17 LAB — LIPID PANEL WITH LDL/HDL RATIO
Cholesterol, Total: 133 mg/dL (ref 100–199)
HDL: 33 mg/dL — ABNORMAL LOW (ref >39)
LDL Chol Calc (NIH): 77 mg/dL (ref 0–99)
LDL/HDL Ratio: 2.3 ratio (ref 0.0–3.6)
Triglycerides: 127 mg/dL (ref 0–149)
VLDL Cholesterol Cal: 23 mg/dL (ref 5–40)

## 2023-03-17 LAB — INSULIN, RANDOM: INSULIN: 18.4 u[IU]/mL (ref 2.6–24.9)

## 2023-03-17 LAB — VITAMIN D 25 HYDROXY (VIT D DEFICIENCY, FRACTURES): Vit D, 25-Hydroxy: 10.5 ng/mL — ABNORMAL LOW (ref 30.0–100.0)

## 2023-03-18 ENCOUNTER — Other Ambulatory Visit (HOSPITAL_COMMUNITY): Payer: Self-pay

## 2023-03-20 ENCOUNTER — Ambulatory Visit: Payer: BC Managed Care – PPO | Admitting: Podiatry

## 2023-03-23 ENCOUNTER — Ambulatory Visit: Payer: BC Managed Care – PPO | Admitting: Podiatry

## 2023-03-24 ENCOUNTER — Encounter: Payer: Self-pay | Admitting: Podiatry

## 2023-03-24 ENCOUNTER — Ambulatory Visit (INDEPENDENT_AMBULATORY_CARE_PROVIDER_SITE_OTHER): Payer: BC Managed Care – PPO | Admitting: Podiatry

## 2023-03-24 DIAGNOSIS — B351 Tinea unguium: Secondary | ICD-10-CM | POA: Diagnosis not present

## 2023-03-24 DIAGNOSIS — Z794 Long term (current) use of insulin: Secondary | ICD-10-CM | POA: Diagnosis not present

## 2023-03-24 DIAGNOSIS — E1165 Type 2 diabetes mellitus with hyperglycemia: Secondary | ICD-10-CM

## 2023-03-24 DIAGNOSIS — M79675 Pain in left toe(s): Secondary | ICD-10-CM | POA: Diagnosis not present

## 2023-03-24 DIAGNOSIS — M79674 Pain in right toe(s): Secondary | ICD-10-CM

## 2023-03-24 NOTE — Progress Notes (Signed)
This patient returns to my office for at risk foot care.  This patient requires this care by a professional since this patient will be at risk due to having diabetes and venous stasis.  This patient is unable to cut nails himself since the patient cannot reach his nails.These nails are painful walking and wearing shoes.  This patient presents for at risk foot care today. ? ?General Appearance  Alert, conversant and in no acute stress. ? ?Vascular  Dorsalis pedis and posterior tibial  pulses are not palpable  bilaterally due to severe swelling..  Capillary return is within normal limits  bilaterally. Temperature is within normal limits  bilaterally. ? ?Neurologic  Senn-Weinstein monofilament wire test within normal limits  bilaterally. Muscle power within normal limits bilaterally. ? ?Nails Thick disfigured discolored nails with subungual debris  from hallux to fifth toes bilaterally. No evidence of bacterial infection or drainage bilaterally. ? ?Orthopedic  No limitations of motion  feet .  No crepitus or effusions noted.  No bony pathology or digital deformities noted. ? ?Skin  normotropic skin with no porokeratosis noted bilaterally.  No signs of infections or ulcers noted.    ? ?Onychomycosis  Pain in right toes  Pain in left toes  Swelling legs/feet ? ?Consent was obtained for treatment procedures.   Mechanical debridement of nails 1-5  bilaterally performed with a nail nipper.  Filed with dremel without incident.  ? ? ?Return office visit   3 months                   Told patient to return for periodic foot care and evaluation due to potential at risk complications. ? ? ?Lovey Crupi DPM  ?

## 2023-03-31 ENCOUNTER — Institutional Professional Consult (permissible substitution): Payer: BC Managed Care – PPO | Admitting: Neurology

## 2023-03-31 ENCOUNTER — Encounter: Payer: Self-pay | Admitting: Neurology

## 2023-04-07 ENCOUNTER — Other Ambulatory Visit (HOSPITAL_COMMUNITY): Payer: Self-pay

## 2023-04-07 ENCOUNTER — Ambulatory Visit (INDEPENDENT_AMBULATORY_CARE_PROVIDER_SITE_OTHER): Payer: BC Managed Care – PPO | Admitting: Physician Assistant

## 2023-04-07 ENCOUNTER — Encounter (INDEPENDENT_AMBULATORY_CARE_PROVIDER_SITE_OTHER): Payer: Self-pay | Admitting: Physician Assistant

## 2023-04-07 VITALS — BP 128/77 | HR 86 | Temp 98.5°F | Ht 75.0 in | Wt 370.0 lb

## 2023-04-07 DIAGNOSIS — E1159 Type 2 diabetes mellitus with other circulatory complications: Secondary | ICD-10-CM | POA: Diagnosis not present

## 2023-04-07 DIAGNOSIS — E559 Vitamin D deficiency, unspecified: Secondary | ICD-10-CM

## 2023-04-07 DIAGNOSIS — E1165 Type 2 diabetes mellitus with hyperglycemia: Secondary | ICD-10-CM | POA: Diagnosis not present

## 2023-04-07 DIAGNOSIS — R600 Localized edema: Secondary | ICD-10-CM

## 2023-04-07 DIAGNOSIS — Z7985 Long-term (current) use of injectable non-insulin antidiabetic drugs: Secondary | ICD-10-CM

## 2023-04-07 DIAGNOSIS — I152 Hypertension secondary to endocrine disorders: Secondary | ICD-10-CM | POA: Diagnosis not present

## 2023-04-07 DIAGNOSIS — Z6841 Body Mass Index (BMI) 40.0 and over, adult: Secondary | ICD-10-CM

## 2023-04-07 DIAGNOSIS — Z794 Long term (current) use of insulin: Secondary | ICD-10-CM

## 2023-04-07 MED ORDER — TIRZEPATIDE 5 MG/0.5ML ~~LOC~~ SOAJ
5.0000 mg | SUBCUTANEOUS | 0 refills | Status: DC
Start: 2023-04-07 — End: 2023-04-07

## 2023-04-07 MED ORDER — VITAMIN D (ERGOCALCIFEROL) 1.25 MG (50000 UNIT) PO CAPS
50000.0000 [IU] | ORAL_CAPSULE | ORAL | 0 refills | Status: DC
Start: 2023-04-07 — End: 2023-05-12
  Filled 2023-04-07 – 2023-04-16 (×2): qty 4, 28d supply, fill #0

## 2023-04-07 MED ORDER — TIRZEPATIDE 5 MG/0.5ML ~~LOC~~ SOAJ
5.0000 mg | SUBCUTANEOUS | 0 refills | Status: DC
Start: 2023-04-07 — End: 2023-05-12
  Filled 2023-04-07 – 2023-04-16 (×2): qty 2, 28d supply, fill #0

## 2023-04-07 NOTE — Progress Notes (Signed)
.smr  Office: [phone]  /  Fax: [phone]  WEIGHT SUMMARY AND BIOMETRICS  Vitals Temp: 98.5 F (36.9 C) BP: 128/77 Pulse Rate: 86 SpO2: 95 %   Anthropometric Measurements Height: 6\' 3"  (1.905 m) Weight: (!) 370 lb (167.8 kg) BMI (Calculated): 46.25 Weight at Last Visit: 374 lb Weight Lost Since Last Visit: 4 lb Starting Weight: 383 lb   Body Composition  Body Fat %: 45.8 % Fat Mass (lbs): 169.8 lbs Muscle Mass (lbs): 191 lbs Visceral Fat Rating : 32   Other Clinical Data Fasting: No Labs: No Today's Visit #: 9 Starting Date: 07/31/22     HPI  Chief Complaint: OBESITY  Luis [name] is here to discuss his progress with his obesity treatment plan. He is on the the Category 3 Plan and states he is following his eating plan approximately 95 % of the time. He states he is exercising 0 minutes 0 times per week.   Interval History:  Since last office visit he is down 4 more pounds Reports much better adherence to his nutrition plan with his sister helping him with compliance of program and attends his visit today Hunger/appetite-well-controlled overall and not skipping meals. Cravings-not excessive and notes significant improvement since starting Mounjaro. Sleep-reports daytime somnolence.  Previous sleep study in 2018 did show sleep apnea but he is going to have to repeat this study at this point and will likely start CPAP depending on what his studies indicate Exercise-we discussed utilizing a pedaler at his desk as he sits for long periods. Hydration-he is improving with drinking some water.  He is rarely having sugar sweetened drinks such as a soda now.   Pharmacotherapy: Mounjaro 2.5 mg weekly. Denies mass in neck, dysphagia, dyspepsia, persistent hoarseness, abdominal pain, or N/V/Constipation or diarrhea. Has annual eye exam. Mood is stable.    TREATMENT PLAN FOR OBESITY:  Recommended Dietary Goals  Luis [name] is currently in the action stage of change. As  such, his goal is to continue weight management plan. He has agreed to the Category 4 Plan.  Behavioral Intervention  We discussed the following Behavioral Modification Strategies today: increasing lean protein intake, decreasing simple carbohydrates , increasing vegetables, increasing lower glycemic fruits, increasing fiber rich foods, avoiding skipping meals, increasing water intake, continue to practice mindfulness when eating, and planning for success.  Additional resources provided today: NA  Recommended Physical Activity Goals  Luis [name] has been advised to work up to 150 minutes of moderate intensity aerobic activity a week and strengthening exercises 2-3 times per week for cardiovascular health, weight loss maintenance and preservation of muscle mass.   He has agreed to Continue current level of physical activity  and Think about ways to increase daily physical activity and overcoming barriers to exercise   Pharmacotherapy We discussed various medication options to help Luis [name] with his weight loss efforts and we both agreed to increase Mounjaro to 5 mg weekly.    Return in about 4 weeks (around 05/05/2023).Marland Kitchen He was informed of the importance of frequent follow up visits to maximize his success with intensive lifestyle modifications for his multiple health conditions.  PHYSICAL EXAM:  Blood pressure 128/77, pulse 86, temperature 98.5 F (36.9 C), height 6\' 3"  (1.905 m), weight (!) 370 lb (167.8 kg), SpO2 95 %. Body mass index is 46.25 kg/m.  General: He is overweight, cooperative, alert, well developed, and in no acute distress. PSYCH: Has normal mood, affect and thought process.   Cardiovascular: HR 80's regular Lungs: Normal breathing effort, no conversational  dyspnea. Neuro: no focal deficits.   DIAGNOSTIC DATA REVIEWED:  BMET    Component Value Date/Time   NA 139 03/16/2023 0908   K 3.7 03/16/2023 0908   CL 103 03/16/2023 0908   CO2 23 03/16/2023 0908   GLUCOSE 129  (H) 03/16/2023 0908   GLUCOSE 129 (H) 10/20/2022 1122   BUN 13 03/16/2023 0908   CREATININE 1.13 03/16/2023 0908   CREATININE 1.25 08/23/2020 1002   CALCIUM 8.6 (L) 03/16/2023 0908   GFRNONAA >60 10/20/2022 1122   GFRAA >60 10/15/2019 1513   Lab Results  Component Value Date   HGBA1C 7.0 (H) 03/16/2023   HGBA1C 7.8 (H) 03/20/2016   Lab Results  Component Value Date   INSULIN 18.4 03/16/2023   INSULIN 36.2 (H) 07/31/2022   Lab Results  Component Value Date   TSH 1.560 07/31/2022   CBC    Component Value Date/Time   WBC 6.6 03/16/2023 0908   WBC 9.5 10/20/2022 1122   RBC 4.43 03/16/2023 0908   RBC 4.40 10/20/2022 1122   HGB 13.9 03/16/2023 0908   HCT 42.2 03/16/2023 0908   PLT 229 03/16/2023 0908   MCV 95 03/16/2023 0908   MCH 31.4 03/16/2023 0908   MCH 32.7 10/20/2022 1122   MCHC 32.9 03/16/2023 0908   MCHC 33.2 10/20/2022 1122   RDW 14.3 03/16/2023 0908   Iron Studies No results found for: "IRON", "TIBC", "FERRITIN", "IRONPCTSAT" Lipid Panel     Component Value Date/Time   CHOL 133 03/16/2023 0908   TRIG 127 03/16/2023 0908   HDL 33 (L) 03/16/2023 0908   CHOLHDL 4.5 07/31/2022 1029   CHOLHDL 4 10/03/2021 1444   VLDL 16.8 10/03/2021 1444   LDLCALC 77 03/16/2023 0908   LDLCALC 96 08/23/2020 1002   Hepatic Function Panel     Component Value Date/Time   PROT 6.8 03/16/2023 0908   ALBUMIN 3.8 03/16/2023 0908   AST 15 03/16/2023 0908   ALT 10 03/16/2023 0908   ALKPHOS 127 (H) 03/16/2023 0908   BILITOT 0.8 03/16/2023 0908   BILIDIR 0.2 12/20/2017 1356   IBILI 1.0 (H) 12/20/2017 1356      Component Value Date/Time   TSH 1.560 07/31/2022 1029   Nutritional Lab Results  Component Value Date   VD25OH 10.5 (L) 03/16/2023   VD25OH 9.4 (L) 07/31/2022    ASSOCIATED CONDITIONS ADDRESSED TODAY  ASSESSMENT AND PLAN  Problem List Items Addressed This Visit     Bilateral edema of lower extremity   Morbid obesity (HCC)   Relevant Medications    tirzepatide (MOUNJARO) 5 MG/0.5ML Pen   Vitamin D deficiency   Relevant Medications   Vitamin D, Ergocalciferol, (DRISDOL) 1.25 MG (50000 UNIT) CAPS capsule   Hypertension associated with diabetes (HCC) (Chronic)   Relevant Medications   tirzepatide (MOUNJARO) 5 MG/0.5ML Pen   Diabetes (HCC) - Primary (Chronic)   Relevant Medications   tirzepatide (MOUNJARO) 5 MG/0.5ML Pen  Type 2 Diabetes Mellitus with other specified complication, with long-term current use of insulin Labs were reviewed today and discussed with the patient.  HgbA1c is not at goal. Last A1c was 7.0 CBGs: Fasting 108-110 Post prandial CBG 150's Episodes of hypoglycemia: no Medication(s): Mounjaro 2.5 mg SQ weekly Denies mass in neck, dysphagia, dyspepsia, persistent hoarseness, abdominal pain, or N/V/Constipation or diarrhea. Has annual eye exam. Mood is stable.  He is on Novolin 70/30 22 units twice daily. Sees PCP for management.   Lab Results  Component Value Date   HGBA1C 7.0 (  H) 03/16/2023   HGBA1C 7.2 (H) 07/31/2022   HGBA1C 6.8 (A) 04/03/2022   Lab Results  Component Value Date   MICROALBUR 3.6 (H) 10/03/2021   LDLCALC 77 03/16/2023   CREATININE 1.13 03/16/2023   Lab Results  Component Value Date   GFR 79.16 10/03/2021   GFR 80.21 06/16/2019   GFR 92.95 06/10/2018    Plan: Continue and increase dose Mounjaro 5.0 mg SQ weekly Monitor CBG closely and if decreasing with increased Mounjaro, plan to decrease Novolin.  Continue working on nutrition plan to decrease simple carbohydrates, increase lean proteins and exercise to promote weight loss and improve glycemic control.  Hypertension Labs were reviewed today and discussed with the patient.  Hypertension well controlled, asymptomatic, and no significant medication side effects noted.  Medication(s): carvedilol 25 mg daily   Demadex 10 mg twice daily  Renal function is normal.   BP Readings from Last 3 Encounters:  04/07/23 128/77  03/16/23  114/69  02/16/23 110/68   Lab Results  Component Value Date   CREATININE 1.13 03/16/2023   CREATININE 1.12 10/20/2022   CREATININE 1.18 08/07/2022   Lab Results  Component Value Date   GFR 79.16 10/03/2021   GFR 80.21 06/16/2019   GFR 92.95 06/10/2018    Plan: Continue all antihypertensives at current dosages. Continue to work on nutrition plan to promote weight loss and improve BP control.   Vitamin D Deficiency Vitamin D is not at goal of 50.  Most recent vitamin D level was 10.5. He is on no supplement currently. Lab Results  Component Value Date   VD25OH 10.5 (L) 03/16/2023   VD25OH 9.4 (L) 07/31/2022    Plan: Re-Start  prescription ergocalciferol 50,000 IU weekly Low vitamin D levels can be associated with adiposity and may result in leptin resistance and weight gain. Also associated with fatigue. Currently on vitamin D supplementation without any adverse effects.  Recheck vitamin D level in 3-4 months to make sure is nearing or in desired goal of 50-70.     ATTESTASTION STATEMENTS:  Reviewed by clinician on day of visit: allergies, medications, problem list, medical history, surgical history, family history, social history, and previous encounter notes.   I have personally spent 45 minutes total time today in preparation, patient care, nutritional counseling and documentation for this visit, including the following: review of clinical lab tests; review of medical tests/procedures/services.      Posey Petrik, PA-C

## 2023-04-08 ENCOUNTER — Other Ambulatory Visit: Payer: Self-pay | Admitting: Family Medicine

## 2023-04-12 ENCOUNTER — Other Ambulatory Visit: Payer: Self-pay | Admitting: Family Medicine

## 2023-04-15 ENCOUNTER — Other Ambulatory Visit (HOSPITAL_COMMUNITY): Payer: Self-pay

## 2023-04-16 ENCOUNTER — Other Ambulatory Visit (HOSPITAL_COMMUNITY): Payer: Self-pay

## 2023-04-17 ENCOUNTER — Other Ambulatory Visit (HOSPITAL_COMMUNITY): Payer: Self-pay

## 2023-04-17 ENCOUNTER — Encounter (HOSPITAL_COMMUNITY): Payer: Self-pay

## 2023-04-18 ENCOUNTER — Other Ambulatory Visit (HOSPITAL_COMMUNITY): Payer: Self-pay

## 2023-04-23 ENCOUNTER — Encounter (HOSPITAL_COMMUNITY): Payer: Self-pay

## 2023-04-23 ENCOUNTER — Other Ambulatory Visit: Payer: Self-pay

## 2023-04-23 ENCOUNTER — Emergency Department (HOSPITAL_COMMUNITY)
Admission: EM | Admit: 2023-04-23 | Discharge: 2023-04-24 | Disposition: A | Discharge status: Home / Self Care | Payer: BC Managed Care – PPO | Attending: Emergency Medicine | Admitting: Emergency Medicine

## 2023-04-23 ENCOUNTER — Emergency Department (HOSPITAL_COMMUNITY): Payer: BC Managed Care – PPO

## 2023-04-23 DIAGNOSIS — Z7982 Long term (current) use of aspirin: Secondary | ICD-10-CM | POA: Insufficient documentation

## 2023-04-23 DIAGNOSIS — Z794 Long term (current) use of insulin: Secondary | ICD-10-CM | POA: Insufficient documentation

## 2023-04-23 DIAGNOSIS — I2510 Atherosclerotic heart disease of native coronary artery without angina pectoris: Secondary | ICD-10-CM | POA: Diagnosis not present

## 2023-04-23 DIAGNOSIS — Z1152 Encounter for screening for COVID-19: Secondary | ICD-10-CM | POA: Insufficient documentation

## 2023-04-23 DIAGNOSIS — F17210 Nicotine dependence, cigarettes, uncomplicated: Secondary | ICD-10-CM | POA: Insufficient documentation

## 2023-04-23 DIAGNOSIS — R0602 Shortness of breath: Secondary | ICD-10-CM | POA: Insufficient documentation

## 2023-04-23 DIAGNOSIS — I509 Heart failure, unspecified: Secondary | ICD-10-CM | POA: Insufficient documentation

## 2023-04-23 DIAGNOSIS — R059 Cough, unspecified: Secondary | ICD-10-CM | POA: Insufficient documentation

## 2023-04-23 DIAGNOSIS — E119 Type 2 diabetes mellitus without complications: Secondary | ICD-10-CM | POA: Insufficient documentation

## 2023-04-23 DIAGNOSIS — I110 Hypertensive heart disease with heart failure: Secondary | ICD-10-CM | POA: Diagnosis not present

## 2023-04-23 DIAGNOSIS — I10 Essential (primary) hypertension: Secondary | ICD-10-CM | POA: Diagnosis not present

## 2023-04-23 DIAGNOSIS — Z79899 Other long term (current) drug therapy: Secondary | ICD-10-CM | POA: Insufficient documentation

## 2023-04-23 LAB — CBC
HCT: 46.3 % (ref 39.0–52.0)
Hemoglobin: 14.8 g/dL (ref 13.0–17.0)
MCH: 31.3 pg (ref 26.0–34.0)
MCHC: 32 g/dL (ref 30.0–36.0)
MCV: 97.9 fL (ref 80.0–100.0)
Platelets: 238 10*3/uL (ref 150–400)
RBC: 4.73 MIL/uL (ref 4.22–5.81)
RDW: 14.4 % (ref 11.5–15.5)
WBC: 8.7 10*3/uL (ref 4.0–10.5)
nRBC: 0 % (ref 0.0–0.2)

## 2023-04-23 LAB — BASIC METABOLIC PANEL
Anion gap: 9 (ref 5–15)
BUN: 17 mg/dL (ref 6–20)
CO2: 24 mmol/L (ref 22–32)
Calcium: 8.5 mg/dL — ABNORMAL LOW (ref 8.9–10.3)
Chloride: 105 mmol/L (ref 98–111)
Creatinine, Ser: 1.19 mg/dL (ref 0.61–1.24)
GFR, Estimated: >60 mL/min (ref >60)
Glucose, Bld: 118 mg/dL — ABNORMAL HIGH (ref 70–99)
Potassium: 3.6 mmol/L (ref 3.5–5.1)
Sodium: 138 mmol/L (ref 135–145)

## 2023-04-23 LAB — BLOOD GAS, VENOUS
Acid-Base Excess: 1 mmol/L (ref 0.0–2.0)
Bicarbonate: 26 mmol/L (ref 20.0–28.0)
O2 Saturation: 88.3 %
Patient temperature: 37
pCO2, Ven: 42 mmHg — ABNORMAL LOW (ref 44–60)
pH, Ven: 7.4 (ref 7.25–7.43)
pO2, Ven: 54 mmHg — ABNORMAL HIGH (ref 32–45)

## 2023-04-23 LAB — TROPONIN I (HIGH SENSITIVITY): Troponin I (High Sensitivity): 9 ng/L (ref <18)

## 2023-04-23 LAB — SARS CORONAVIRUS 2 BY RT PCR: SARS Coronavirus 2 by RT PCR: NEGATIVE

## 2023-04-23 LAB — BRAIN NATRIURETIC PEPTIDE: B Natriuretic Peptide: 99.7 pg/mL (ref 0.0–100.0)

## 2023-04-23 MED ORDER — IOHEXOL 350 MG/ML SOLN
75.0000 mL | Freq: Once | INTRAVENOUS | Status: AC | PRN
  Administered 2023-04-23: 75 mL via INTRAVENOUS

## 2023-04-23 MED ORDER — METHYLPREDNISOLONE SODIUM SUCC 125 MG IJ SOLR
125.0000 mg | Freq: Once | INTRAMUSCULAR | Status: AC
  Administered 2023-04-23: 125 mg via INTRAVENOUS
  Filled 2023-04-23: qty 2

## 2023-04-23 MED ORDER — IPRATROPIUM-ALBUTEROL 0.5-2.5 (3) MG/3ML IN SOLN
3.0000 mL | Freq: Once | RESPIRATORY_TRACT | Status: AC
  Administered 2023-04-23: 3 mL via RESPIRATORY_TRACT
  Filled 2023-04-23: qty 3

## 2023-04-23 MED ORDER — SODIUM CHLORIDE 0.9 % IV SOLN
100.0000 mg | Freq: Once | INTRAVENOUS | Status: AC
  Administered 2023-04-23: 100 mg via INTRAVENOUS
  Filled 2023-04-23: qty 100

## 2023-04-23 MED ORDER — ALBUTEROL SULFATE (2.5 MG/3ML) 0.083% IN NEBU
2.5000 mg | INHALATION_SOLUTION | Freq: Once | RESPIRATORY_TRACT | Status: AC
  Administered 2023-04-23: 2.5 mg via RESPIRATORY_TRACT
  Filled 2023-04-23: qty 3

## 2023-04-23 NOTE — ED Triage Notes (Signed)
Pt BIB [facility] from home. Pt presents with a cough that started today with associated ShOB with exertion. EMS report ronchi to lung sounds. Pt with hx CHF and recurrent pneumonia. EMS placed pt on 2L O2 via N/C.     EMS Vitals  SpO2 92 on R/A 164/80 HR80 CBG 128

## 2023-04-23 NOTE — ED Provider Notes (Signed)
[place] EMERGENCY DEPARTMENT AT [facility] Provider Note   CSN: [id number] Arrival date & time: 04/23/23  1946     History  No chief complaint on file.   Luis [name] is a 57 y.o. male.  HPI   57 year old male presents emergency ferment with complaints of cough, shortness of breath.  Patient states that symptoms began around 4 PM this afternoon when he was sitting at home.  Reports compliance with his at home medications.  Denies any known increase in at home weights.  Denies any chest pain.  Denies fever, chills, abdominal pain, nausea, vomiting, urinary symptoms, change of bowel habits.  Past medical history significant for nonischemic cardiomyopathy, CAD, chronic heart failure with improved ejection fraction, sleep apnea, OSA, diabetes mellitus, atrial fibrillation, venous stasis dermatitis, hypertension, hyperlipidemia  Home Medications Prior to Admission medications   Medication Sig Start Date End Date Taking? Authorizing Provider  albuterol (VENTOLIN HFA) 108 (90 Base) MCG/ACT inhaler Inhale 1-2 puffs into the lungs every 6 (six) hours as needed for wheezing or shortness of breath. 04/24/23  Yes Sponseller, Rebekah R, PA-C  aspirin EC 81 MG EC tablet Take 1 tablet (81 mg total) by mouth daily. 03/26/16   Dorothea Ogle, MD  carvedilol (COREG) 25 MG tablet TAKE 1 TABLET BY MOUTH TWICE DAILY 04/09/23   Ardith Dark, MD  glucose blood test strip Check glucose TID/ E11.9 06/25/22   Ardith Dark, MD  Insulin Pen Needle 31G X 8 MM MISC Use to inject insulin twice daily 04/13/19   Ardith Dark, MD  Insulin Pen Needle 32G X 6 MM MISC Use to administer insulin two times daily 04/05/18   Ardith Dark, MD  OneTouch Delica Lancets 33G MISC Use TID/ E11.9 06/06/21   Ardith Dark, MD  potassium chloride SA (KLOR-CON M) 20 MEQ tablet TAKE 1 TABLET(20 MEQ) BY MOUTH DAILY 02/09/23   Ardith Dark, MD  simvastatin (ZOCOR) 20 MG tablet Take 20 mg by mouth at bedtime.  01/13/23   [provider]  tirzepatide Greggory Keen) 5 MG/0.5ML Pen Inject 5 mg into the skin once a week. 04/07/23   [name], Fanny Bien, PA-C  torsemide (DEMADEX) 10 MG tablet TAKE 1 TABLET(10 MG) BY MOUTH TWICE DAILY 04/13/23   Ardith Dark, MD  Vitamin D, Ergocalciferol, (DRISDOL) 1.25 MG (50000 UNIT) CAPS capsule Take 1 capsule (50,000 Units total) by mouth every 7 (seven) days. 04/07/23   [name], Fanny Bien, PA-C      Allergies    Demerol [meperidine] and Morphine and codeine    Review of Systems   Review of Systems  All other systems reviewed and are negative.   Physical Exam Updated Vital Signs BP (!) 174/98   Pulse 71   Temp 98.3 F (36.8 C) (Oral)   Resp 16   Ht 6\' 3"  (1.905 m)   Wt (!) 167.8 kg   SpO2 96%   BMI 46.25 kg/m  Physical Exam Vitals and nursing note reviewed.  Constitutional:      General: He is not in acute distress.    Appearance: He is well-developed.  HENT:     Head: Normocephalic and atraumatic.  Eyes:     Conjunctiva/sclera: Conjunctivae normal.  Cardiovascular:     Rate and Rhythm: Normal rate and regular rhythm.     Heart sounds: No murmur heard. Pulmonary:     Effort: Pulmonary effort is normal. No respiratory distress.     Breath sounds: Wheezing  and rhonchi present. No rales.     Comments: Diffuse wheeze/rhonchi auscultated bilateral lung fields. Abdominal:     Palpations: Abdomen is soft.     Tenderness: There is no abdominal tenderness. There is no guarding.  Musculoskeletal:        General: No swelling.     Cervical back: Neck supple.     Right lower leg: Edema present.     Left lower leg: Edema present.     Comments: Patient with venous stasis changes bilateral lower extremities.  1-2+ pitting edema bilateral lower extremities.  Skin:    General: Skin is warm and dry.     Capillary Refill: Capillary refill takes less than 2 seconds.  Neurological:     Mental Status: He is alert.  Psychiatric:        Mood  and Affect: Mood normal.     ED Results / Procedures / Treatments   Labs (all labs ordered are listed, but only abnormal results are displayed) Labs Reviewed  BASIC METABOLIC PANEL - Abnormal; Notable for the following components:      Result Value   Glucose, Bld 118 (*)    Calcium 8.5 (*)    All other components within normal limits  BLOOD GAS, VENOUS - Abnormal; Notable for the following components:   pCO2, Ven 42 (*)    pO2, Ven 54 (*)    All other components within normal limits  SARS CORONAVIRUS 2 BY RT PCR  CBC  BRAIN NATRIURETIC PEPTIDE  TROPONIN I (HIGH SENSITIVITY)  TROPONIN I (HIGH SENSITIVITY)    EKG None  Radiology CT Angio Chest PE W/Cm &/Or Wo Cm  Result Date: 04/23/2023 CLINICAL DATA:  Cough and shortness of breath. PE suspected. History of CHF and recurrent pneumonia EXAM: CT ANGIOGRAPHY CHEST WITH CONTRAST TECHNIQUE: Multidetector CT imaging of the chest was performed using the standard protocol during bolus administration of intravenous contrast. Multiplanar CT image reconstructions and MIPs were obtained to evaluate the vascular anatomy. RADIATION DOSE REDUCTION: This exam was performed according to the departmental dose-optimization program which includes automated exposure control, adjustment of the mA and/or kV according to patient size and/or use of iterative reconstruction technique. CONTRAST:  75mL OMNIPAQUE IOHEXOL 350 MG/ML SOLN COMPARISON:  Radiograph 04/23/2023 and CT chest abdomen and pelvis 06/11/2022 FINDINGS: Cardiovascular: Satisfactory opacification of the pulmonary arteries to the segmental level. No evidence of pulmonary embolism. Normal heart size. No pericardial effusion. Mediastinum/Nodes: No enlarged mediastinal, hilar, or axillary lymph nodes. Thyroid gland, trachea, and esophagus demonstrate no significant findings. Lungs/Pleura: Lungs are clear. No pleural effusion or pneumothorax. Mild bronchial wall thickening. Upper Abdomen: No acute  abnormality. Musculoskeletal: No chest wall abnormality. No acute osseous findings. Thoracic spondylosis with multilevel bridging anterior osteophytes. Review of the MIP images confirms the above findings. IMPRESSION: 1. No evidence of pulmonary embolism or acute pulmonary process. 2. Mild bronchial thickening. Electronically Signed   By: Minerva Fester M.D.   On: 04/23/2023 23:44   DG Chest 2 View  Result Date: 04/23/2023 CLINICAL DATA:  Sudden onset productive cough and shortness of breath EXAM: CHEST - 2 VIEW COMPARISON:  Radiograph 10/20/2022 FINDINGS: Stable cardiomediastinal silhouette. Bronchial wall thickening similar to 10/20/2022. No focal consolidation, pleural effusion, or pneumothorax. No displaced rib fractures. IMPRESSION: Bronchial wall thickening without focal pneumonia. Electronically Signed   By: Minerva Fester M.D.   On: 04/23/2023 20:27    Procedures Procedures    Medications Ordered in ED Medications  ipratropium-albuterol (DUONEB) 0.5-2.5 (3) MG/3ML nebulizer  solution 3 mL (3 mLs Nebulization Given 04/23/23 2044)  albuterol (PROVENTIL) (2.5 MG/3ML) 0.083% nebulizer solution 2.5 mg (2.5 mg Nebulization Given 04/23/23 2231)  methylPREDNISolone sodium succinate (SOLU-MEDROL) 125 mg/2 mL injection 125 mg (125 mg Intravenous Given 04/23/23 2240)  doxycycline (VIBRAMYCIN) 100 mg in sodium chloride 0.9 % 250 mL IVPB (0 mg Intravenous Stopped 04/24/23 0210)  iohexol (OMNIPAQUE) 350 MG/ML injection 75 mL (75 mLs Intravenous Contrast Given 04/23/23 2310)  ipratropium-albuterol (DUONEB) 0.5-2.5 (3) MG/3ML nebulizer solution 3 mL (3 mLs Nebulization Given 04/24/23 0055)    ED Course/ Medical Decision Making/ A&P Clinical Course as of 04/24/23 0903  Thu Apr 23, 2023  2125 Reevaluation patient showed significant improvement of symptoms after breathing treatment.  Will check pulse ox when ambulate and obtain CT imaging of patient's chest for PE rule out. [CR]    Clinical Course User  Index [CR] Peter Garter, PA                             Medical Decision Making Amount and/or Complexity of Data Reviewed Labs: ordered. Radiology: ordered.  Risk Prescription drug management.   This patient presents to the ED for concern of shortness of breath, this involves an extensive number of treatment options, and is a complaint that carries with it a high risk of complications and morbidity.  The differential diagnosis includes The causes for shortness of breath include but are not limited to Cardiac (AHF, pericardial effusion and tamponade, arrhythmias, ischemia, etc) Respiratory (COPD, asthma, pneumonia, pneumothorax, primary pulmonary hypertension, PE/VQ mismatch) Hematological (anemia)  Co morbidities that complicate the patient evaluation  See HPI   Additional history obtained:  Additional history obtained from EMR External records from outside source obtained and reviewed including hospital records   Lab Tests:  I Ordered, and personally interpreted labs.  The pertinent results include: No leukocytosis.  No evidence of anemia.  Platelets within range.  Mild hypocalcemia of 8.5 but no other electrolyte abnormalities.  No renal dysfunction.  VBG significant for normal pH, pCO2 decreased to 42 cells.  To increase to 54.  BNP within normal range of 99.7.  Initial troponin of 9 with repeat pending.  COVID-negative.   Imaging Studies ordered:  I ordered imaging studies including chest x-ray, CT angio chest PE I independently visualized and interpreted imaging which showed  Chest x-ray: Bronchial wall thickening without focal pneumonia CT angio chest PE: Pending I agree with the radiologist interpretation  Cardiac Monitoring: / EKG:  The patient was maintained on a cardiac monitor.  I personally viewed and interpreted the cardiac monitored which showed an underlying rhythm of: Sinus rhythm with nonspecific T wave changes in inferior leads   Consultations  Obtained:  See ED course  Problem List / ED Course / Critical interventions / Medication management  Shortness of breath, cough I ordered medication including DuoNeb, albuterol  Reevaluation of the patient after these medicines showed that the patient improved I have reviewed the patients home medicines and have made adjustments as needed   Social Determinants of Health:  Chronic cigarette use.  Denies illicit drug use.   Test / Admission - Considered:  Shortness of breath, cough Vitals signs significant for hypertension with pressure of 83/105. Otherwise within normal range and stable throughout visit. Laboratory/imaging studies significant for: See above 57 year old male presents emergency department with complaints of shortness of breath as well as cough.  Patient's workup today so far rather reassuring.  Patient with normal BNP without evidence of pulmonary vascular congestion on chest x-ray imagings low suspicion for CHF exacerbation.  Patient with initial troponin of 9 without obvious acute ischemic changes; awaiting second troponin for heart pathway score and ACS rule out.  No evidence of pneumonia on chest x-ray.  Patient did present with diffuse wheeze/rhonchi clinically with significant improvement after nebulized breathing treatment.  Suspect possible underlying bronchitis versus COPD.  Awaiting second troponin for ACS rule out as well as CT imaging of the chest at shift change.  Patient initially with reported oxygen saturations in the low 90s; awaiting ambulatory pulse ox evaluation.  Disposition pending at this time.  Patient change, patient care handed off to [employer]  Patient stable upon shift change.        Final Clinical Impression(s) / ED Diagnoses Final diagnoses:  SOB (shortness of breath)    Rx / DC Orders ED Discharge Orders          Ordered    albuterol (VENTOLIN HFA) 108 (90 Base) MCG/ACT inhaler  Every 6 hours PRN        04/24/23 0101               Peter Garter, PA 04/24/23 0903    Melene Plan, DO 04/24/23 2322

## 2023-04-23 NOTE — ED Notes (Signed)
Pt ambulated in hallway O2 remained at 96%/97%.  Pt feels he does not need the nasal canula anymore.

## 2023-04-23 NOTE — ED Provider Notes (Signed)
  Physical Exam  BP (!) 153/105 (BP Location: Left Leg)   Pulse 93   Temp 97.7 F (36.5 C) (Oral)   Resp 20   Ht 6\' 3"  (1.905 m)   Wt (!) 167.8 kg   SpO2 91%   BMI 46.25 kg/m   Physical Exam  Procedures  Procedures  ED Course / MDM   Clinical Course as of 04/23/23 2252  Thu Apr 23, 2023  2125 Reevaluation patient showed significant improvement of symptoms after breathing treatment.  Will check pulse ox when ambulate and obtain CT imaging of patient's chest for PE rule out. [CR]    Clinical Course User Index [CR] Peter Garter, PA   Medical Decision Making Amount and/or Complexity of Data Reviewed Labs: ordered. Radiology: ordered.    Details: CT PE study without evidence of PE or acute pulmonary process  Risk Prescription drug management.      Care of this patient assumed from preceding ED provider Sherian Maroon, PA-C at time of shift change.  Please see his associated note for further insight and the patient's ED course.  In brief, Patient is a 57 year old male with history of smoking, CHF, CAD, sleep apnea, atrial fibrillation, hypertension who presents with concern for cough and shortness of breath that began around 4:00 this afternoon when he was sitting at home.  Endorses compliance with medications.  No other associated symptoms.  Patient with bilateral lower extremity edema on exam per preceding ED provider, CBC without leukocytosis, BMP unremarkable, troponin negative 2, COVID test negative, BNP is normal.  VBG without acidosis.  pCO2 of 42.  Chest x-ray with bronchial wall thickening without focal pneumonia, visualized by provider.  EKG with sinus rhythm, without ischemic changes.  Patient treated for COPD exacerbation with significant improvement in his symptoms following DuoNebs, was initially requiring 2 L of oxygen by nasal cannula but now on room air.  Patient ambulated by nurse tach with oxygen saturation remaining greater than 96% on room air.  Attempt  to change patient pending CT PE study ordered by preceding ED provider.  Disposition pending completion of this imaging study.  CT PE study without evidence of PE or acute pulmonary process.  Patient reevaluated by this provider.  He has complete resolution of the shortness of breath and chest tightness following single DuoNeb and steroids.  On repeat auscultation patient's lungs he has some persistent wheezing and chest tightness, repeat DuoNeb administered with significant improvement in exam again.  Patient feeling quite well at this time, hemodynamically stable.  No evidence of acute infectious etiology or PE on his CT angiogram as ordered by preceding ED provider.  Upon reviewing the patient's case and physical exam, no indication for admission at this time.  Suspect likely undiagnosed COPD.  Recommend follow-up with his PCP.  Albuterol inhaler prescribed, no further workup warranted near this time.  Tim  voiced understanding of hi medical evaluation and treatment plan. Each of their questions answered to their expressed satisfaction.  Return precautions were given.  Patient is well-appearing, stable, and was discharged in good condition.  This chart was dictated using voice recognition software, Dragon. Despite the best efforts of this provider to proofread and correct errors, errors may still occur which can change documentation meaning.         Paris Lore, PA-C 04/24/23 0104    Melene Plan, DO 04/24/23 2322

## 2023-04-23 NOTE — ED Notes (Signed)
Pt transport to xray.

## 2023-04-24 MED ORDER — IPRATROPIUM-ALBUTEROL 0.5-2.5 (3) MG/3ML IN SOLN
3.0000 mL | Freq: Once | RESPIRATORY_TRACT | Status: AC
  Administered 2023-04-24: 3 mL via RESPIRATORY_TRACT
  Filled 2023-04-24: qty 3

## 2023-04-24 MED ORDER — ALBUTEROL SULFATE HFA 108 (90 BASE) MCG/ACT IN AERS: 1.0000 | INHALATION_SPRAY | Freq: Four times a day (QID) | RESPIRATORY_TRACT | 0 refills | Status: DC | PRN

## 2023-04-24 NOTE — Discharge Instructions (Addendum)
You were seen in the ER today for your shortness of breath, which improved with breathing treatments. You have been prescribed an inhaler which you may use for any recurrent chest tightness. Please follow up with your primary care doctor and return to the ER with any new severe symptoms.

## 2023-05-12 ENCOUNTER — Ambulatory Visit (INDEPENDENT_AMBULATORY_CARE_PROVIDER_SITE_OTHER): Payer: BC Managed Care – PPO | Admitting: Physician Assistant

## 2023-05-12 ENCOUNTER — Institutional Professional Consult (permissible substitution): Payer: BC Managed Care – PPO | Admitting: Nurse Practitioner

## 2023-05-12 ENCOUNTER — Ambulatory Visit (INDEPENDENT_AMBULATORY_CARE_PROVIDER_SITE_OTHER): Payer: BC Managed Care – PPO | Admitting: Adult Health

## 2023-05-12 ENCOUNTER — Encounter (HOSPITAL_BASED_OUTPATIENT_CLINIC_OR_DEPARTMENT_OTHER): Payer: Self-pay | Admitting: Adult Health

## 2023-05-12 ENCOUNTER — Encounter (INDEPENDENT_AMBULATORY_CARE_PROVIDER_SITE_OTHER): Payer: Self-pay | Admitting: Physician Assistant

## 2023-05-12 VITALS — BP 98/65 | HR 82 | Temp 97.7°F | Ht 75.0 in | Wt 362.0 lb

## 2023-05-12 VITALS — BP 126/76 | HR 78 | Ht 75.0 in | Wt 366.0 lb

## 2023-05-12 DIAGNOSIS — G4733 Obstructive sleep apnea (adult) (pediatric): Secondary | ICD-10-CM | POA: Diagnosis not present

## 2023-05-12 DIAGNOSIS — Z6841 Body Mass Index (BMI) 40.0 and over, adult: Secondary | ICD-10-CM

## 2023-05-12 DIAGNOSIS — E1165 Type 2 diabetes mellitus with hyperglycemia: Secondary | ICD-10-CM | POA: Diagnosis not present

## 2023-05-12 DIAGNOSIS — Z794 Long term (current) use of insulin: Secondary | ICD-10-CM

## 2023-05-12 DIAGNOSIS — E559 Vitamin D deficiency, unspecified: Secondary | ICD-10-CM

## 2023-05-12 DIAGNOSIS — E1159 Type 2 diabetes mellitus with other circulatory complications: Secondary | ICD-10-CM

## 2023-05-12 DIAGNOSIS — E538 Deficiency of other specified B group vitamins: Secondary | ICD-10-CM | POA: Diagnosis not present

## 2023-05-12 DIAGNOSIS — I152 Hypertension secondary to endocrine disorders: Secondary | ICD-10-CM | POA: Diagnosis not present

## 2023-05-12 DIAGNOSIS — I428 Other cardiomyopathies: Secondary | ICD-10-CM

## 2023-05-12 DIAGNOSIS — Z7985 Long-term (current) use of injectable non-insulin antidiabetic drugs: Secondary | ICD-10-CM

## 2023-05-12 MED ORDER — TIRZEPATIDE 5 MG/0.5ML ~~LOC~~ SOAJ: 5.0000 mg | SUBCUTANEOUS | 0 refills | Status: DC

## 2023-05-12 MED ORDER — VITAMIN D (ERGOCALCIFEROL) 1.25 MG (50000 UNIT) PO CAPS
50000.0000 [IU] | ORAL_CAPSULE | ORAL | 0 refills | Status: DC
Start: 2023-05-12 — End: 2023-06-16

## 2023-05-12 MED ORDER — CARVEDILOL 12.5 MG PO TABS
12.5000 mg | ORAL_TABLET | Freq: Two times a day (BID) | ORAL | 3 refills | Status: DC
Start: 2023-05-12 — End: 2023-08-12

## 2023-05-12 NOTE — Patient Instructions (Signed)
Set up for split night sleep study  Work on healthy weight loss  Do not drive if sleepy  Follow up in 6 weeks to discuss results and treatment plan   

## 2023-05-12 NOTE — Progress Notes (Signed)
@Patient  ID: Luis [name], male    DOB: 10/02/1966, 57 y.o.   MRN: [id number]  Chief Complaint  Patient presents with   Consult    Referring provider: Ardith Dark, MD  HPI: 57 year old male seen for sleep consult May 12, 2023 to reestablish for sleep apnea Former patient of Dr. Vassie Loll  TEST/EVENTS :  Hospitalized 4/29-5/15/2018  for acute respiratory failure requiring mechanical ventilation due to influenza pneumonia. He also has nonischemic cardiomyopathy with EF of 35-40% with a normal left heart cath in 03/2016 in chronic atrial fibrillation. He required mechanical ventilation from 4/29 -03/12/2017.   Home sleep study July 2017 showed mild sleep apnea with AHI of 14/hour  CPAP titration study on July 29, 2017 showed optimal control at 14 cm H2O.   05/12/2023 Sleep consult  Patient presents for a sleep consult today.  Kindly referred by Dr. [name].  Patient has a history of sleep apnea originally that was diagnosed in 2017.  He underwent a CPAP titration study in September 2018 that showed optimal control at CPAP 14 cm H2O.  Patient says that he did not start on CPAP as recommended .  Has a history of severe critical illness in April 2018 with influenza pneumonia that caused acute respiratory failure requiring vent support.  Complicated by nonischemic cardiomyopathy.  A-fib.  He is followed by cardiology.  Recent echo in 2023 showed improved EF at 60 to 65%, grade 1 diastolic dysfunction.  Moderate dilation of the RV. Patient complains that he continues to have ongoing symptoms of restless sleep and significant daytime sleepiness.  Feels tired all the time.  Typically goes to bed around 10 PM.  Goes to sleep pretty quickly but is up multiple times throughout the night.  Gets up at 7 AM.  Weight is stable over the last 2 years.  Current weight is at 366 pounds with a BMI of 45.  He does not use any sleep aids.  No history of stroke.  No removable dental work.  No symptoms are  suspicious for cataplexy or sleep paralysis.  Epworth score is 10 out of 24.  Typically gets sleepy if he sits down to watch TV in the evening hours and after eating lunch.  Patient says he does not know if he snores or not.  Social history Patient is single.  Has 1 adult child.  Works Water engineer.  He works in Location manager..  Patient lives alone.  Smokes about 7 to 10 cigarettes daily.  Social alcohol.  No drug use.  Family history is positive for heart disease and hypertension.     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 03/2016 Echo: EF 35-40%, diff HK, GrII DD; b. 03/2017 Echo: EF 50-55%; c. 12/2021 Echo: EF 60-65%, no rwma, GrI DD, mod enlarged RV w/ nl fxn, Ao root 38mm, Asc Ao 37mm.   Difficult intubation 12/06/2020   DM (diabetes mellitus) (HCC)    Edema    both ankles and  feet   Essential hypertension    History of tobacco abuse    Hyperlipidemia    Morbid obesity (HCC)    NICM (nonischemic cardiomyopathy) (HCC)    a. 03/2016 Echo: EF 35-40%, diff HK, GrII DD; b. 03/2017 Echo: EF 50-55%; c. 12/2021 Echo: EF 60-65%, no rwma, GrI DD.   Non-obstructive CAD (coronary artery disease)    a. 03/2016 Cath: LM nl, LAD 35p, LCX nl, RCA nl. EF 35-45%.   Noncompliance    OSA (obstructive sleep apnea)    Pneumonia 2017   Premature atrial contractions    Shortness of breath     Sleep apnea     Tobacco History: Social History   Tobacco Use  Smoking Status Every Day   Packs/day: 0.50   Years: 20.00   Additional pack years: 0.00   Total pack years: 10.00   Types: Cigarettes   Last attempt to quit: 03/02/2009   Years since quitting: 14.2  Smokeless Tobacco Never   Ready to quit: Not Answered Counseling given: Not Answered   Outpatient Medications Prior to Visit  Medication Sig Dispense Refill   albuterol (VENTOLIN HFA) 108 (90 Base) MCG/ACT inhaler Inhale 1-2 puffs into the lungs every 6 (six) hours as needed for wheezing or shortness of breath. 1 each 0   aspirin EC 81 MG EC tablet Take 1 tablet (81 mg total) by mouth daily. 30 tablet 0   carvedilol (COREG) 12.5 MG tablet Take 1 tablet (12.5 mg total) by mouth 2 (two) times daily with a meal. 60 tablet 3   glucose blood test strip Check glucose TID/ E11.9 100 each 12   Insulin Pen Needle 31G X 8 MM MISC Use to inject insulin twice daily 100 each 11   Insulin Pen Needle 32G X 6 MM MISC Use to administer insulin two times daily 90 each 11   OneTouch Delica Lancets 33G MISC Use TID/ E11.9 100 each 5   potassium chloride SA (KLOR-CON M) 20 MEQ tablet TAKE 1 TABLET(20 MEQ) BY MOUTH DAILY 30 tablet 2   simvastatin (ZOCOR) 20 MG tablet Take 20 mg by mouth at bedtime.     tirzepatide [facility]) 5 MG/0.5ML Pen Inject 5 mg into the skin once a week. 6 mL 0   torsemide (DEMADEX) 10 MG tablet TAKE 1 TABLET(10 MG) BY MOUTH TWICE DAILY 60 tablet 1   Vitamin D, Ergocalciferol, (DRISDOL) 1.25 MG (50000 UNIT) CAPS capsule Take 1 capsule (50,000 Units total) by mouth every 7 (seven) days. 4 capsule 0   No facility-administered medications prior to visit.     Review of Systems:   Constitutional:   No  weight loss, night sweats,  Fevers, chills, +fatigue, or  lassitude.  HEENT:   No headaches,  Difficulty swallowing,  Tooth/dental problems, or  Sore throat,                No sneezing, itching, ear ache, nasal  congestion, post nasal drip,   CV:  No chest pain,  Orthopnea, PND, swelling in lower extremities, anasarca, dizziness, palpitations, syncope.   GI  No heartburn, indigestion, abdominal pain, nausea, vomiting, diarrhea, change in bowel habits, loss of appetite, bloody stools.   Resp: No shortness of breath with exertion or at rest.  No excess mucus, no productive cough,  No non-productive cough,  No coughing up of blood.  No change in color of mucus.  No wheezing.  No chest wall deformity  Skin: no rash or lesions.  GU: no dysuria,  change in color of urine, no urgency or frequency.  No flank pain, no hematuria   MS:  No joint pain or swelling.  No decreased range of motion.  No back pain.    Physical Exam  BP 126/76   Pulse 78   Ht 6\' 3"  (1.905 m)   Wt (!) 366 lb (166 kg)   SpO2 96%   BMI 45.75 kg/m   GEN: A/Ox3; pleasant , NAD, BMI 45    HEENT:  [place]/AT,  NOSE-clear, THROAT-clear, no lesions, no postnasal drip or exudate noted.   NECK:  Supple w/ fair ROM; no JVD; normal carotid impulses w/o bruits; no thyromegaly or nodules palpated; no lymphadenopathy.    RESP  Clear  P & A; w/o, wheezes/ rales/ or rhonchi. no accessory muscle use, no dullness to percussion  CARD:  RRR, no m/r/g, 1+ peripheral edema, pulses intact, no cyanosis or clubbing.  GI:   Soft & nt; nml bowel sounds; no organomegaly or masses detected.   Musco: Warm bil, no deformities or joint swelling noted.   Neuro: alert, no focal deficits noted.    Skin: Warm, no lesions or rashes           No data to display          No results found for: "NITRICOXIDE"      Assessment & Plan:   OSA (obstructive sleep apnea), mild, not using CPAP Has history of sleep apnea with ongoing restless sleep and daytime sleepiness.  Patient has significant symptom burden.  Suspect that he has underlying sleep apnea.  Set patient up for a split-night sleep study.  Has a history of congestive heart  failure/cardiomyopathy.  And A-fib.  - discussed how weight can impact sleep and risk for sleep disordered breathing - discussed options to assist with weight loss: combination of diet modification, cardiovascular and strength training exercises   - had an extensive discussion regarding the adverse health consequences related to untreated sleep disordered breathing - specifically discussed the risks for hypertension, coronary artery disease, cardiac dysrhythmias, cerebrovascular disease, and diabetes - lifestyle modification discussed   - discussed how sleep disruption can increase risk of accidents, particularly when driving - safe driving practices were discussed    Plan  Patient Instructions  Set up for split night sleep study Work on healthy weight loss  Do not drive if sleepy  Follow up in 6 weeks to discuss results and treatment plan .     NICM (nonischemic cardiomyopathy) (HCC), left heart cath and coronary angiography 2017 History of nonischemic cardiomyopathy during critical illness.  Patient follows with cardiology recent echo shows improved EF.  Continue follow-up with cardiology  BMI 45.0-49.9, adult (HCC) Current BMI 46.4 Healthy weight loss discussed     Rubye Oaks, NP 05/12/2023

## 2023-05-12 NOTE — Assessment & Plan Note (Signed)
Healthy weight loss discussed 

## 2023-05-12 NOTE — Progress Notes (Signed)
.smr  Office: [phone]  /  Fax: [phone]  WEIGHT SUMMARY AND BIOMETRICS  Vitals Temp: 97.7 F (36.5 C) BP: 98/65 Pulse Rate: 82 SpO2: 97 %   Anthropometric Measurements Height: 6\' 3"  (1.905 m) Weight: (!) 362 lb (164.2 kg) BMI (Calculated): 45.25 Weight at Last Visit: 370 lb Weight Lost Since Last Visit: 8 lb Weight Gained Since Last Visit: 0 Starting Weight: 383 lb Total Weight Loss (lbs): 21 lb (9.526 kg) Peak Weight: 383 lb   Body Composition  Body Fat %: 43.7 % Fat Mass (lbs): 158.2 lbs Muscle Mass (lbs): 194 lbs Total Body Water (lbs): 166.4 lbs Visceral Fat Rating : 29   Other Clinical Data Fasting: no Labs: no Today's Visit #: 10 Starting Date: 07/31/22     HPI  Chief Complaint: OBESITY  Luis [name] is here to discuss his progress with his obesity treatment plan. He is on the the Category 4 Plan and states he is following his eating plan approximately 55 % of the time. He states he is exercising 0 minutes 0 times per week.   Interval History:  Since last office visit he down 8 lbs. / Down total of 21 lbs. / TBW loss 5.5% mostly over past 3 months.  Maintaining muscle mass,  Down 10 lbs of adipose! Now able to see total body water ,but no previous measurement for comparison.   Hunger/appetite-moderate control.  Cravings- not excessive.   Exercise-Was more active last week and out in heat some.  Hydration-Making better choices and avoiding sugar sweetened beverages, but not sure is hydrating well overall with low BP in clinic today.   Was seen in ER 04/23/2023 for cough and SOB.  Chest CT negative for PE . Lungs were clear, no pneumonia or evidence of heart failure or other concerning findings. EKG no evidence of new ischemia, negative cardiac enzymes.  Thought to have some mild COPD with exacerbation as resolved with nebulizer treatment and dose of steroids.   Pharmacotherapy: Mounjaro 5 mg weekly for Type 2 diabetes. Denies mass in neck,  dysphagia, dyspepsia, persistent hoarseness, abdominal pain, or N/V/Constipation or diarrhea. Has annual eye exam. Mood is stable.    TREATMENT PLAN FOR OBESITY:  Recommended Dietary Goals  [name] is currently in the action stage of change. As such, his goal is to continue weight management plan. He has agreed to the Category 4 Plan.  Behavioral Intervention  We discussed the following Behavioral Modification Strategies today: increasing lean protein intake, decreasing simple carbohydrates , increasing vegetables, increasing lower glycemic fruits, increasing fiber rich foods, avoiding skipping meals, increasing water intake, continue to practice mindfulness when eating, planning for success, and better snacking choices.  Additional resources provided today: NA  Recommended Physical Activity Goals  [name] has been advised to work up to 150 minutes of moderate intensity aerobic activity a week and strengthening exercises 2-3 times per week for cardiovascular health, weight loss maintenance and preservation of muscle mass.   He has agreed to Continue current level of physical activity  and Think about ways to increase daily physical activity and overcoming barriers to exercise   Pharmacotherapy We discussed various medication options to help [name] with his weight loss efforts and we both agreed to continue Mounjaro 5 mg weekly for Type 2 diabetes.    Return in about 4 weeks (around 06/09/2023).Marland Kitchen He was informed of the importance of frequent follow up visits to maximize his success with intensive lifestyle modifications for his multiple health conditions.  PHYSICAL EXAM:  Blood  pressure 98/65, pulse 82, temperature 97.7 F (36.5 C), height 6\' 3"  (1.905 m), weight (!) 362 lb (164.2 kg), SpO2 97 %. Body mass index is 45.25 kg/m.  General: He is overweight, cooperative, alert, well developed, and in no acute distress. PSYCH: Has normal mood, affect and thought process.    Cardiovascular: HR 80's BP 96/55 initially- BP recheck 98/65 Lungs: Normal breathing effort, no conversational dyspnea. Neuro: no focal deficit  DIAGNOSTIC DATA REVIEWED:  BMET    Component Value Date/Time   NA 138 04/23/2023 2025   NA 139 03/16/2023 0908   K 3.6 04/23/2023 2025   CL 105 04/23/2023 2025   CO2 24 04/23/2023 2025   GLUCOSE 118 (H) 04/23/2023 2025   BUN 17 04/23/2023 2025   BUN 13 03/16/2023 0908   CREATININE 1.19 04/23/2023 2025   CREATININE 1.25 08/23/2020 1002   CALCIUM 8.5 (L) 04/23/2023 2025   GFRNONAA >60 04/23/2023 2025   GFRAA >60 10/15/2019 1513   Lab Results  Component Value Date   HGBA1C 7.0 (H) 03/16/2023   HGBA1C 7.8 (H) 03/20/2016   Lab Results  Component Value Date   INSULIN 18.4 03/16/2023   INSULIN 36.2 (H) 07/31/2022   Lab Results  Component Value Date   TSH 1.560 07/31/2022   CBC    Component Value Date/Time   WBC 8.7 04/23/2023 2025   RBC 4.73 04/23/2023 2025   HGB 14.8 04/23/2023 2025   HGB 13.9 03/16/2023 0908   HCT 46.3 04/23/2023 2025   HCT 42.2 03/16/2023 0908   PLT 238 04/23/2023 2025   PLT 229 03/16/2023 0908   MCV 97.9 04/23/2023 2025   MCV 95 03/16/2023 0908   MCH 31.3 04/23/2023 2025   MCHC 32.0 04/23/2023 2025   RDW 14.4 04/23/2023 2025   RDW 14.3 03/16/2023 0908   Iron Studies No results found for: "IRON", "TIBC", "FERRITIN", "IRONPCTSAT" Lipid Panel     Component Value Date/Time   CHOL 133 03/16/2023 0908   TRIG 127 03/16/2023 0908   HDL 33 (L) 03/16/2023 0908   CHOLHDL 4.5 07/31/2022 1029   CHOLHDL 4 10/03/2021 1444   VLDL 16.8 10/03/2021 1444   LDLCALC 77 03/16/2023 0908   LDLCALC 96 08/23/2020 1002   Hepatic Function Panel     Component Value Date/Time   PROT 6.8 03/16/2023 0908   ALBUMIN 3.8 03/16/2023 0908   AST 15 03/16/2023 0908   ALT 10 03/16/2023 0908   ALKPHOS 127 (H) 03/16/2023 0908   BILITOT 0.8 03/16/2023 0908   BILIDIR 0.2 12/20/2017 1356   IBILI 1.0 (H) 12/20/2017 1356       Component Value Date/Time   TSH 1.560 07/31/2022 1029   Nutritional Lab Results  Component Value Date   VD25OH 10.5 (L) 03/16/2023   VD25OH 9.4 (L) 07/31/2022    ASSOCIATED CONDITIONS ADDRESSED TODAY  ASSESSMENT AND PLAN  Problem List Items Addressed This Visit     OSA (obstructive sleep apnea), mild, not using CPAP   Morbid obesity (HCC)   Relevant Medications   tirzepatide (MOUNJARO) 5 MG/0.5ML Pen   Vitamin D deficiency   Relevant Medications   Vitamin D, Ergocalciferol, (DRISDOL) 1.25 MG (50000 UNIT) CAPS capsule   B12 deficiency   BMI 45.0-49.9, adult (HCC) Current BMI 46.4   Relevant Medications   tirzepatide (MOUNJARO) 5 MG/0.5ML Pen   Hypertension associated with diabetes (HCC) (Chronic)   Relevant Medications   tirzepatide (MOUNJARO) 5 MG/0.5ML Pen   carvedilol (COREG) 12.5 MG tablet   Other Relevant  Orders   Basic Metabolic Panel (BMET)   Diabetes (HCC) - Primary (Chronic)   Relevant Medications   tirzepatide (MOUNJARO) 5 MG/0.5ML Pen   Other Relevant Orders   Basic Metabolic Panel (BMET)   Type 2 Diabetes Mellitus with other specified complication, with long-term current use of insulin HgbA1c is not at goal. Last A1c was 7.0 CBGs: Fasting 98-130's before pm meal.  Episodes of hypoglycemia: no Medication(s): Mounjaro 5.0 mg SQ weekly Denies mass in neck, dysphagia, dyspepsia, persistent hoarseness, abdominal pain, or N/V/Constipation or diarrhea. Has annual eye exam. Mood is stable.   Also- Insulin 70/30 24 units daily ( Down from 48 units)  On Statin On ARB  Lab Results  Component Value Date   HGBA1C 7.0 (H) 03/16/2023   HGBA1C 7.2 (H) 07/31/2022   HGBA1C 6.8 (A) 04/03/2022   Lab Results  Component Value Date   MICROALBUR 3.6 (H) 10/03/2021   LDLCALC 77 03/16/2023   CREATININE 1.19 04/23/2023   Lab Results  Component Value Date   GFR 79.16 10/03/2021   GFR 80.21 06/16/2019   GFR 92.95 06/10/2018    Plan: Continue and refill Mounjaro 5.0  mg SQ weekly Continue working on nutrition plan to decrease simple carbohydrates, increase lean proteins and exercise to promote weight loss and improve glycemic control .   Hypertension Hypertension  Reports feeling light headed today. BP 98/65 . No chest pain, no SOB, no nausea, no other symptoms, no fevers or chills. Has not eaten today, but did drink protein shake earlier. Felt better following  oral hydration in the office. Discussed if not feeling better over next 24 hours, needs to follow up with PCP. Wi .  Will check BMET today and patient will monitor BP at home and check CBG's at home Medication(s): Carvedilol 25 mg twice daily   Torsemide 10 mg twice daily Renal function normal range. Normally wearing compression to lower extremities which helps with edema, but not wearing to visit for weight check in our clinic. May want vascular evaluation for persistent edema/lymphedema.  BP Readings from Last 3 Encounters:  05/12/23 98/65  04/24/23 (!) 174/98  04/07/23 128/77   Lab Results  Component Value Date   CREATININE 1.19 04/23/2023   CREATININE 1.13 03/16/2023   CREATININE 1.12 10/20/2022   Lab Results  Component Value Date   GFR 79.16 10/03/2021   GFR 80.21 06/16/2019   GFR 92.95 06/10/2018   2D echo 12/05/2021: Left Ventricle: Left ventricular ejection fraction, by estimation, is 60  to 65%. The left ventricle has normal function. The left ventricle has no  regional wall motion abnormalities. The left ventricular internal cavity  size was normal in size. There is   moderate concentric left ventricular hypertrophy. Left ventricular  diastolic parameters are consistent with Grade I diastolic dysfunction  (impaired relaxation). Normal left ventricular filling pressure.   Plan: Patient reports was feeling dizzy, light headed but otherwise normal. Hydrated orally in clinic and reports feeling much better. ? Mild dehydration vs. Effects of weight loss with Mounjaro .   Recheck  BMET today. Hydrate well today and seek care if continues to have any ongoing concerns.  Will decrease carvedilol to 12.5 mg twice daily. Continue torsemide 10 mg twice daily. Will monitor closely as continues to loose weight as seeing significant reduction in need for medication for BP management in patients taking GLP/GIP medications.   Vitamin D Deficiency Vitamin D is not at goal of 50.  Most recent vitamin D level was 10.5. He  is on  prescription ergocalciferol 50,000 IU weekly. Lab Results  Component Value Date   VD25OH 10.5 (L) 03/16/2023   VD25OH 9.4 (L) 07/31/2022    Plan: Continue and refill  prescription ergocalciferol 50,000 IU weekly Low vitamin D levels can be associated with adiposity and may result in leptin resistance and weight gain. Also associated with fatigue. Currently on vitamin D supplementation without any adverse effects.  Plan to recheck vitamin D level over the next 3-4 months.   Low serum B 12 level:  Taking B 12 500 mcg daily. No side effects. Last B 12 level 285 03/16/2023. Plan: Continue B 12 500 mcg daily. Plan to recheck B 12 level over the next 3-4 months.   Sleep apnea: Has appointment for initial sleep apnea consult later today.  Reports daytime somnolence. Previous sleep study in 2018 did show sleep apnea but he is going to have to repeat evaluation at this point and may start CPAP depending on what his evaluation indicates.  Plan: Intensive lifestyle modifications are the first line treatment for this issue. We discussed several lifestyle modifications today and he will continue to work on diet, exercise and weight loss efforts. We will continue to monitor. Orders and follow up as documented in patient record.    ATTESTASTION STATEMENTS:  Reviewed by clinician on day of visit: allergies, medications, problem list, medical history, surgical history, family history, social history, and previous encounter notes.   I have personally spent 30 minutes  total time today in preparation, patient care, nutritional counseling and documentation for this visit, including the following: review of clinical lab tests; review of medical tests/procedures/services.      Braylie Badami, PA-C

## 2023-05-12 NOTE — Assessment & Plan Note (Signed)
History of nonischemic cardiomyopathy during critical illness.  Patient follows with cardiology recent echo shows improved EF.  Continue follow-up with cardiology

## 2023-05-12 NOTE — Assessment & Plan Note (Signed)
Has history of sleep apnea with ongoing restless sleep and daytime sleepiness.  Patient has significant symptom burden.  Suspect that he has underlying sleep apnea.  Set patient up for a split-night sleep study.  Has a history of congestive heart failure/cardiomyopathy.  And A-fib.  - discussed how weight can impact sleep and risk for sleep disordered breathing - discussed options to assist with weight loss: combination of diet modification, cardiovascular and strength training exercises   - had an extensive discussion regarding the adverse health consequences related to untreated sleep disordered breathing - specifically discussed the risks for hypertension, coronary artery disease, cardiac dysrhythmias, cerebrovascular disease, and diabetes - lifestyle modification discussed   - discussed how sleep disruption can increase risk of accidents, particularly when driving - safe driving practices were discussed    Plan  Patient Instructions  Set up for split night sleep study Work on healthy weight loss  Do not drive if sleepy  Follow up in 6 weeks to discuss results and treatment plan .

## 2023-05-13 ENCOUNTER — Emergency Department (HOSPITAL_COMMUNITY)
Admission: EM | Admit: 2023-05-13 | Discharge: 2023-05-13 | Disposition: A | Discharge status: Home / Self Care | Payer: BC Managed Care – PPO | Attending: Emergency Medicine | Admitting: Emergency Medicine

## 2023-05-13 ENCOUNTER — Other Ambulatory Visit: Payer: Self-pay

## 2023-05-13 ENCOUNTER — Emergency Department (HOSPITAL_COMMUNITY): Payer: BC Managed Care – PPO

## 2023-05-13 ENCOUNTER — Encounter (HOSPITAL_COMMUNITY): Payer: Self-pay

## 2023-05-13 DIAGNOSIS — G4730 Sleep apnea, unspecified: Secondary | ICD-10-CM | POA: Diagnosis not present

## 2023-05-13 DIAGNOSIS — R0602 Shortness of breath: Secondary | ICD-10-CM | POA: Diagnosis not present

## 2023-05-13 DIAGNOSIS — J441 Chronic obstructive pulmonary disease with (acute) exacerbation: Secondary | ICD-10-CM | POA: Diagnosis not present

## 2023-05-13 DIAGNOSIS — I10 Essential (primary) hypertension: Secondary | ICD-10-CM | POA: Diagnosis not present

## 2023-05-13 DIAGNOSIS — R0989 Other specified symptoms and signs involving the circulatory and respiratory systems: Secondary | ICD-10-CM | POA: Diagnosis not present

## 2023-05-13 DIAGNOSIS — Z7982 Long term (current) use of aspirin: Secondary | ICD-10-CM | POA: Insufficient documentation

## 2023-05-13 DIAGNOSIS — R079 Chest pain, unspecified: Secondary | ICD-10-CM | POA: Diagnosis not present

## 2023-05-13 LAB — CBC
HCT: 46.8 % (ref 39.0–52.0)
Hemoglobin: 15.4 g/dL (ref 13.0–17.0)
MCH: 32 pg (ref 26.0–34.0)
MCHC: 32.9 g/dL (ref 30.0–36.0)
MCV: 97.1 fL (ref 80.0–100.0)
Platelets: 227 10*3/uL (ref 150–400)
RBC: 4.82 MIL/uL (ref 4.22–5.81)
RDW: 14.2 % (ref 11.5–15.5)
WBC: 7.3 10*3/uL (ref 4.0–10.5)
nRBC: 0 % (ref 0.0–0.2)

## 2023-05-13 LAB — BASIC METABOLIC PANEL
Anion gap: 14 (ref 5–15)
BUN/Creatinine Ratio: 13 (ref 9–20)
BUN: 17 mg/dL (ref 6–24)
BUN: 16 mg/dL (ref 6–20)
CO2: 20 mmol/L (ref 20–29)
CO2: 24 mmol/L (ref 22–32)
Calcium: 8.8 mg/dL (ref 8.7–10.2)
Calcium: 8.5 mg/dL — ABNORMAL LOW (ref 8.9–10.3)
Chloride: 101 mmol/L (ref 96–106)
Chloride: 102 mmol/L (ref 98–111)
Creatinine, Ser: 1.32 mg/dL — ABNORMAL HIGH (ref 0.76–1.27)
Creatinine, Ser: 1.42 mg/dL — ABNORMAL HIGH (ref 0.61–1.24)
GFR, Estimated: 58 mL/min — ABNORMAL LOW (ref >60)
Glucose, Bld: 136 mg/dL — ABNORMAL HIGH (ref 70–99)
Glucose: 129 mg/dL — ABNORMAL HIGH (ref 70–99)
Potassium: 4 mmol/L (ref 3.5–5.2)
Potassium: 4.2 mmol/L (ref 3.5–5.1)
Sodium: 140 mmol/L (ref 134–144)
Sodium: 140 mmol/L (ref 135–145)
eGFR: 64 mL/min/{1.73_m2} (ref >59)

## 2023-05-13 LAB — TROPONIN I (HIGH SENSITIVITY): Troponin I (High Sensitivity): 12 ng/L (ref <18)

## 2023-05-13 LAB — BRAIN NATRIURETIC PEPTIDE: B Natriuretic Peptide: 39.2 pg/mL (ref 0.0–100.0)

## 2023-05-13 LAB — MAGNESIUM: Magnesium: 2.1 mg/dL (ref 1.7–2.4)

## 2023-05-13 MED ORDER — PREDNISONE 50 MG PO TABS: 50.0000 mg | ORAL_TABLET | Freq: Every day | ORAL | 0 refills | Status: DC

## 2023-05-13 MED ORDER — ALBUTEROL SULFATE HFA 108 (90 BASE) MCG/ACT IN AERS
1.0000 | INHALATION_SPRAY | Freq: Four times a day (QID) | RESPIRATORY_TRACT | Status: DC | PRN
  Filled 2023-05-13: qty 6.7

## 2023-05-13 MED ORDER — IPRATROPIUM-ALBUTEROL 0.5-2.5 (3) MG/3ML IN SOLN
3.0000 mL | Freq: Once | RESPIRATORY_TRACT | Status: AC
  Administered 2023-05-13: 3 mL via RESPIRATORY_TRACT
  Filled 2023-05-13: qty 9

## 2023-05-13 NOTE — ED Provider Notes (Signed)
[place] EMERGENCY DEPARTMENT AT [facility] Provider Note   CSN: [id number] Arrival date & time: 05/13/23  4098     History {Add pertinent medical, surgical, social history, OB history to HPI:1} Chief Complaint  Patient presents with  . Shortness of Breath    Luis [name] is a 57 y.o. male.   Shortness of Breath    Pt started having trouble with shortness of breath last evening.  He has been coughing up some clear mucus.  Coughing up all night long. Some leg swelling but improving.  NO weight gain.  Pt does smoke cigarettes.  He does have CHF.  Pt also experiencing tightness in his chest but that has resolved. Patient did have an office visit yesterday with pulmonology.  They set the patient up to have a split night sleep study.  Home Medications Prior to Admission medications   Medication Sig Start Date End Date Taking? Authorizing Provider  albuterol (VENTOLIN HFA) 108 (90 Base) MCG/ACT inhaler Inhale 1-2 puffs into the lungs every 6 (six) hours as needed for wheezing or shortness of breath. 04/24/23   Sponseller, Eugene Gavia, PA-C  aspirin EC 81 MG EC tablet Take 1 tablet (81 mg total) by mouth daily. 03/26/16   Dorothea Ogle, MD  carvedilol (COREG) 12.5 MG tablet Take 1 tablet (12.5 mg total) by mouth 2 (two) times daily with a meal. 05/12/23   [name], Fanny Bien, PA-C  glucose blood test strip Check glucose TID/ E11.9 06/25/22   Ardith Dark, MD  Insulin Pen Needle 31G X 8 MM MISC Use to inject insulin twice daily 04/13/19   Ardith Dark, MD  Insulin Pen Needle 32G X 6 MM MISC Use to administer insulin two times daily 04/05/18   Ardith Dark, MD  OneTouch Delica Lancets 33G MISC Use TID/ E11.9 06/06/21   Ardith Dark, MD  potassium chloride SA (KLOR-CON M) 20 MEQ tablet TAKE 1 TABLET(20 MEQ) BY MOUTH DAILY 02/09/23   Ardith Dark, MD  simvastatin (ZOCOR) 20 MG tablet Take 20 mg by mouth at bedtime. 01/13/23   [provider]  tirzepatide  Greggory Keen) 5 MG/0.5ML Pen Inject 5 mg into the skin once a week. 05/12/23   [name], Fanny Bien, PA-C  torsemide (DEMADEX) 10 MG tablet TAKE 1 TABLET(10 MG) BY MOUTH TWICE DAILY 04/13/23   Ardith Dark, MD  Vitamin D, Ergocalciferol, (DRISDOL) 1.25 MG (50000 UNIT) CAPS capsule Take 1 capsule (50,000 Units total) by mouth every 7 (seven) days. 05/12/23   [name], Fanny Bien, PA-C      Allergies    Demerol [meperidine] and Morphine and codeine    Review of Systems   Review of Systems  Respiratory:  Positive for shortness of breath.     Physical Exam Updated Vital Signs BP (!) 142/91 (BP Location: Right Arm)   Pulse 87   Temp 98.7 F (37.1 C) (Oral)   Resp 18   Ht 1.905 m (6\' 3" )   Wt (!) 166 kg   SpO2 96%   BMI 45.75 kg/m  Physical Exam Vitals and nursing note reviewed.  Constitutional:      General: He is not in acute distress.    Appearance: He is well-developed.  HENT:     Head: Normocephalic and atraumatic.     Right Ear: External ear normal.     Left Ear: External ear normal.  Eyes:     General: No scleral icterus.  Right eye: No discharge.        Left eye: No discharge.     Conjunctiva/sclera: Conjunctivae normal.  Neck:     Trachea: No tracheal deviation.  Cardiovascular:     Rate and Rhythm: Normal rate and regular rhythm.  Pulmonary:     Effort: Pulmonary effort is normal. No respiratory distress.     Breath sounds: Normal breath sounds. No stridor. No wheezing, rhonchi or rales.  Abdominal:     General: Bowel sounds are normal. There is no distension.     Palpations: Abdomen is soft.     Tenderness: There is no abdominal tenderness. There is no guarding or rebound.  Musculoskeletal:        General: No tenderness or deformity.     Cervical back: Neck supple.     Right lower leg: Edema present.     Left lower leg: Edema present.  Skin:    General: Skin is warm and dry.     Findings: No rash.  Neurological:     General: No focal deficit  present.     Mental Status: He is alert.     Cranial Nerves: No cranial nerve deficit, dysarthria or facial asymmetry.     Sensory: No sensory deficit.     Motor: No abnormal muscle tone or seizure activity.     Coordination: Coordination normal.  Psychiatric:        Mood and Affect: Mood normal.    ED Results / Procedures / Treatments   Labs (all labs ordered are listed, but only abnormal results are displayed) Labs Reviewed - No data to display  EKG None  Radiology No results found.  Procedures Procedures  {Document cardiac monitor, telemetry assessment procedure when appropriate:1}  Medications Ordered in ED Medications - No data to display  ED Course/ Medical Decision Making/ A&P Clinical Course as of 05/13/23 0755  Wed May 13, 2023  0753 Brain natriuretic peptide (order ONLY if patient c/o SOB) BNP normal at 39.  Magnesium normal at 2.1 [JK]  0754 Basic metabolic panel(!) Metabolic panel normal. [JK]  0754 CBC CBC normal [JK]  0754 CXR negative [JK]    Clinical Course User Index [JK] Linwood Dibbles, MD   {   Click here for ABCD2, HEART and other calculatorsREFRESH Note before signing :1}                          Medical Decision Making Amount and/or Complexity of Data Reviewed Labs: ordered. Decision-making details documented in ED Course. Radiology: ordered.  Risk Prescription drug management.   ***  {Document critical care time when appropriate:1} {Document review of labs and clinical decision tools ie heart score, Chads2Vasc2 etc:1}  {Document your independent review of radiology images, and any outside records:1} {Document your discussion with family members, caretakers, and with consultants:1} {Document social determinants of health affecting pt's care:1} {Document your decision making why or why not admission, treatments were needed:1} Final Clinical Impression(s) / ED Diagnoses Final diagnoses:  None    Rx / DC Orders ED Discharge Orders      None

## 2023-05-13 NOTE — Discharge Instructions (Addendum)
Follow-up with your pulmonary doctors to be rechecked.  Take the medications as prescribed to help with wheezing and congestion.  Continue to try to quit smoking.  The steroid medication will cause your blood sugar to increase but that should return back to normal once discontinuing the medication

## 2023-05-13 NOTE — ED Triage Notes (Signed)
Pt complaining of shortness of breath that started around 11 pm, said when he lays down it feels like his breath is cut off. This also caused his chest to feel tight. Was using his albuterol inhaler 4 puffs every 30 min tonight. Did have a sleep study for c-pap

## 2023-05-15 ENCOUNTER — Ambulatory Visit: Payer: BC Managed Care – PPO | Admitting: Family Medicine

## 2023-05-21 ENCOUNTER — Other Ambulatory Visit: Payer: Self-pay | Admitting: Family Medicine

## 2023-05-21 DIAGNOSIS — E876 Hypokalemia: Secondary | ICD-10-CM

## 2023-05-22 ENCOUNTER — Ambulatory Visit: Payer: BC Managed Care – PPO | Admitting: Family Medicine

## 2023-06-15 NOTE — Progress Notes (Signed)
.smr  Office: [phone]  /  Fax: [phone]  WEIGHT SUMMARY AND BIOMETRICS  Vitals Temp: 97.7 F (36.5 C) BP: (!) 90/57 Pulse Rate: (!) 101 SpO2: 95 %   Anthropometric Measurements Height: 6\' 3"  (1.905 m) Weight: (!) 368 lb (166.9 kg) BMI (Calculated): 46 Weight at Last Visit: 362 lb Weight Lost Since Last Visit: 0 Weight Gained Since Last Visit: 6lb Starting Weight: 383lb Total Weight Loss (lbs): 15 lb (6.804 kg) Peak Weight: 383lb   Body Composition  Body Fat %: 44.5 % Fat Mass (lbs): 163.8 lbs Muscle Mass (lbs): 194.2 lbs Visceral Fat Rating : 30   Other Clinical Data Fasting: yes Labs: no Today's Visit #: 11 Starting Date: 07/31/22     HPI  Chief Complaint: OBESITY  Luis [name] is here to discuss his progress with his obesity treatment plan. He is on the the Category 4 Plan and states he is following his eating plan approximately 35 % of the time. He states he is exercising 0 minutes 0 times per week.   Interval History:  Since last office visit he is up 6 pounds Reports he got off track with his eating plan over the past month and has been eating white bread and drinking regular sodas.  We discussed how this impacts his diabetes management and overall weight loss and he is working to get back on track with his nutrition plan.  We also discussed smoking cessation today.  He is smoking about 1 pack of cigarettes every 3 days.  He reports successful quitting for about 3 years in the past, but this was following a lengthy illness in which he was on the ventilator in 2018. He has had numerous bronchitis/COPD exacerbations. He would like to stop smoking again and we discussed resources such as the CDC [employer] website as resources.   Hunger/appetite-not controlled when off plan and he is working to get back on track with his eating plan Cravings- for regular soda, white bread Exercise-nothing consistent.  Hydration-discussed trying to get back to zero sugar  soda if drinking soda and water Protein- he does eat fish and beef and feels he is getting close to his protein goals, but then eating other foods and over eating.    Pharmacotherapy: Mounjaro 5 mg weekly for type 2 diabetes. Denies mass in neck, dysphagia, dyspepsia, persistent hoarseness, abdominal pain, or N/V/Constipation or diarrhea. Has annual eye exam. Mood is stable.    TREATMENT PLAN FOR OBESITY:  Recommended Dietary Goals  Luis [name] is currently in the action stage of change. As such, his goal is to continue weight management plan. He has agreed to the Category 4 Plan.  Behavioral Intervention  We discussed the following Behavioral Modification Strategies today: increasing lean protein intake, decreasing simple carbohydrates , increasing vegetables, increasing lower glycemic fruits, increasing fiber rich foods, increasing water intake, keeping healthy foods at home, identifying sources and decreasing liquid calories, decreasing eating out or consumption of processed foods, and making healthy choices when eating convenient foods, emotional eating strategies and understanding the difference between hunger signals and cravings, work on managing stress, creating time for self-care and relaxation measures, avoiding temptations and identifying enticing environmental cues, continue to practice mindfulness when eating, and planning for success.  Additional resources provided today: NA  Recommended Physical Activity Goals  Luis [name] has been advised to work up to 150 minutes of moderate intensity aerobic activity a week and strengthening exercises 2-3 times per week for cardiovascular health, weight loss maintenance and preservation of muscle mass.  He has agreed to Continue current level of physical activity  and Think about ways to increase daily physical activity and overcoming barriers to exercise   Pharmacotherapy We discussed various medication options to help Luis [name] with his weight  loss efforts and we both agreed to continue Mounjaro 5 mg weekly for type 2 diabetes.    Return in about 4 weeks (around 07/14/2023).Marland Kitchen He was informed of the importance of frequent follow up visits to maximize his success with intensive lifestyle modifications for his multiple health conditions.  PHYSICAL EXAM:  Blood pressure (!) 90/57, pulse (!) 101, temperature 97.7 F (36.5 C), height 6\' 3"  (1.905 m), weight (!) 368 lb (166.9 kg), SpO2 95%. Body mass index is 46 kg/m.  General: He is overweight, cooperative, alert, well developed, and in no acute distress. PSYCH: Has normal mood, affect and thought process.   Cardiovascular: HR low 100's when walking into clinic, but improved to 80's and regular following rest in clinic. BP 90/57- asymptomatic- Reports he is feeling very well today.  Lungs: Normal breathing effort, no conversational dyspnea.  DIAGNOSTIC DATA REVIEWED:  BMET    Component Value Date/Time   NA 140 05/13/2023 0635   NA 140 05/12/2023 1136   K 4.2 05/13/2023 0635   CL 102 05/13/2023 0635   CO2 24 05/13/2023 0635   GLUCOSE 136 (H) 05/13/2023 0635   BUN 16 05/13/2023 0635   BUN 17 05/12/2023 1136   CREATININE 1.42 (H) 05/13/2023 0635   CREATININE 1.25 08/23/2020 1002   CALCIUM 8.5 (L) 05/13/2023 0635   GFRNONAA 58 (L) 05/13/2023 0635   GFRAA >60 10/15/2019 1513   Lab Results  Component Value Date   HGBA1C 7.0 (H) 03/16/2023   HGBA1C 7.8 (H) 03/20/2016   Lab Results  Component Value Date   INSULIN 18.4 03/16/2023   INSULIN 36.2 (H) 07/31/2022   Lab Results  Component Value Date   TSH 1.560 07/31/2022   CBC    Component Value Date/Time   WBC 7.3 05/13/2023 0635   RBC 4.82 05/13/2023 0635   HGB 15.4 05/13/2023 0635   HGB 13.9 03/16/2023 0908   HCT 46.8 05/13/2023 0635   HCT 42.2 03/16/2023 0908   PLT 227 05/13/2023 0635   PLT 229 03/16/2023 0908   MCV 97.1 05/13/2023 0635   MCV 95 03/16/2023 0908   MCH 32.0 05/13/2023 0635   MCHC 32.9  05/13/2023 0635   RDW 14.2 05/13/2023 0635   RDW 14.3 03/16/2023 0908   Iron Studies No results found for: "IRON", "TIBC", "FERRITIN", "IRONPCTSAT" Lipid Panel     Component Value Date/Time   CHOL 133 03/16/2023 0908   TRIG 127 03/16/2023 0908   HDL 33 (L) 03/16/2023 0908   CHOLHDL 4.5 07/31/2022 1029   CHOLHDL 4 10/03/2021 1444   VLDL 16.8 10/03/2021 1444   LDLCALC 77 03/16/2023 0908   LDLCALC 96 08/23/2020 1002   Hepatic Function Panel     Component Value Date/Time   PROT 6.8 03/16/2023 0908   ALBUMIN 3.8 03/16/2023 0908   AST 15 03/16/2023 0908   ALT 10 03/16/2023 0908   ALKPHOS 127 (H) 03/16/2023 0908   BILITOT 0.8 03/16/2023 0908   BILIDIR 0.2 12/20/2017 1356   IBILI 1.0 (H) 12/20/2017 1356      Component Value Date/Time   TSH 1.560 07/31/2022 1029   Nutritional Lab Results  Component Value Date   VD25OH 10.5 (L) 03/16/2023   VD25OH 9.4 (L) 07/31/2022    ASSOCIATED CONDITIONS ADDRESSED TODAY  ASSESSMENT AND PLAN  Problem List Items Addressed This Visit     OSA (obstructive sleep apnea), mild, not using CPAP   Morbid obesity (HCC)   Relevant Medications   tirzepatide (MOUNJARO) 5 MG/0.5ML Pen   Vitamin D deficiency   Relevant Medications   Vitamin D, Ergocalciferol, (DRISDOL) 1.25 MG (50000 UNIT) CAPS capsule   Other Relevant Orders   VITAMIN D 25 Hydroxy (Vit-D Deficiency, Fractures)   B12 deficiency   Hypertension associated with diabetes (HCC) (Chronic)   Relevant Medications   tirzepatide (MOUNJARO) 5 MG/0.5ML Pen   Hyperlipidemia associated with type 2 diabetes mellitus (HCC) (Chronic)   Relevant Medications   tirzepatide (MOUNJARO) 5 MG/0.5ML Pen   Diabetes (HCC) - Primary (Chronic)   Relevant Medications   tirzepatide (MOUNJARO) 5 MG/0.5ML Pen   Other Relevant Orders   CMP14+EGFR   Hemoglobin A1c   Type 2 Diabetes Mellitus with other specified complication, without long-term current use of insulin HgbA1c is not at goal. Last A1c was  7.0  Medication(s): Mounjaro 5.0 mg SQ weekly Denies mass in neck, dysphagia, dyspepsia, persistent hoarseness, abdominal pain, or N/V/Constipation or diarrhea. Has annual eye exam. Mood is stable.   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   Lab Results  Component Value Date   HGBA1C 7.0 (H) 03/16/2023   HGBA1C 7.2 (H) 07/31/2022   HGBA1C 6.8 (A) 04/03/2022   Lab Results  Component Value Date   MICROALBUR 3.6 (H) 10/03/2021   LDLCALC 77 03/16/2023   CREATININE 1.42 (H) 05/13/2023   Lab Results  Component Value Date   GFR 79.16 10/03/2021   GFR 80.21 06/16/2019   GFR 92.95 06/10/2018    Plan: Continue and refill Mounjaro 5.0 mg SQ weekly Recheck CMET and A1c today.  Continue working on nutrition plan to decrease simple carbohydrates, increase lean proteins and exercise to promote weight loss and improve glycemic control .    Hypertension Hypertension improved, asymptomatic, and needs further observation.  Medication(s): Carvedilol 12.5 mg twice daily   Torsemide 10 mg twice daily for edema management Renal function has been slightly worsening.  Will plan to recheck chemistries today. Normally wears compression to his lower extremities and we discussed continuing to utilize compression as well as a wedge for consistent elevation when not up and around.  May also need further evaluation by vascular for persistent edema/lymphedema.   2D echo 12/05/2021: Left Ventricle: Left ventricular ejection fraction, by estimation, is 60  to 65%. The left ventricle has normal function. The left ventricle has no  regional wall motion abnormalities. The left ventricular internal cavity  size was normal in size. There is   moderate concentric left ventricular hypertrophy. Left ventricular  diastolic parameters are consistent with Grade I diastolic dysfunction  (impaired  relaxation). Normal left ventricular filling pressure.   BP Readings from Last 3 Encounters:  06/16/23 (!) 90/57  05/13/23 131/85  05/12/23 126/76   Lab Results  Component Value Date   CREATININE 1.42 (H) 05/13/2023   CREATININE 1.32 (H) 05/12/2023   CREATININE 1.19 04/23/2023   Lab Results  Component Value Date   GFR 79.16 10/03/2021   GFR 80.21 06/16/2019   GFR 92.95 06/10/2018    Plan: Continue all antihypertensives at current dosages.  Monitor BP closely as treatment with [facility] may necessitate tapering of medications. Continue to work on nutrition plan to promote weight loss and improve BP control.  Recheck chemistries today.  Hyperlipidemia LDL is at goal.  HDL at 33-not at goal/triglycerides 127 at goal Medication(s): Simvastatin 20 mg daily.  No side effects Cardiovascular risk factors: diabetes mellitus, dyslipidemia, hypertension, male gender, microalbuminuria, obesity (BMI >= 30 kg/m2), sedentary lifestyle, and smoking/ tobacco exposure  Lab Results  Component Value Date   CHOL 133 03/16/2023   HDL 33 (L) 03/16/2023   LDLCALC 77 03/16/2023   TRIG 127 03/16/2023   CHOLHDL 4.5 07/31/2022   CHOLHDL 4 10/03/2021   CHOLHDL 4.1 08/23/2020   Lab Results  Component Value Date   ALT 10 03/16/2023   AST 15 03/16/2023   ALKPHOS 127 (H) 03/16/2023   BILITOT 0.8 03/16/2023   The 10-year ASCVD risk score (Arnett DK, et al., 2019) is: 17.8%   Values used to calculate the score:     Age: 57 years     Sex: Male     Is Non-Hispanic African American: Yes     Diabetic: Yes     Tobacco smoker: Yes     Systolic Blood Pressure: 90 mmHg     Is BP treated: Yes     HDL Cholesterol: 33 mg/dL     Total Cholesterol: 133 mg/dL  Plan: Continue statin. Continue Mounjaro. Continue to work on nutrition plan -decreasing simple carbohydrates, increasing lean proteins, decreasing saturated fats and cholesterol , avoiding trans fats and exercise as able to promote weight loss,  improve lipids and decrease cardiovascular risks. Plan to recheck lipid panel in next 2 months.    Vitamin D Deficiency Vitamin D is not at goal of 50.  Most recent vitamin D level was 10.5. He is on  prescription ergocalciferol 50,000 IU weekly.  No N/V or muscle weakness or other side effect with ergocalciferol. Lab Results  Component Value Date   VD25OH 10.5 (L) 03/16/2023   VD25OH 9.4 (L) 07/31/2022    Plan: Continue and refill  prescription ergocalciferol 50,000 IU weekly Recheck vitamin D level today Low vitamin D levels can be associated with adiposity and may result in leptin resistance and weight gain. Also associated with fatigue. Currently on vitamin D supplementation without any adverse effects.    ATTESTASTION STATEMENTS:  Reviewed by clinician on day of visit: allergies, medications, problem list, medical history, surgical history, family history, social history, and previous encounter notes.   I have personally spent 45 minutes total time today in preparation, patient care, nutritional counseling and documentation for this visit, including the following: review of clinical lab tests; review of medical tests/procedures/services.       , PA-C

## 2023-06-16 ENCOUNTER — Encounter (INDEPENDENT_AMBULATORY_CARE_PROVIDER_SITE_OTHER): Payer: Self-pay | Admitting: Physician Assistant

## 2023-06-16 ENCOUNTER — Ambulatory Visit (INDEPENDENT_AMBULATORY_CARE_PROVIDER_SITE_OTHER): Payer: BC Managed Care – PPO | Admitting: Physician Assistant

## 2023-06-16 VITALS — BP 90/57 | HR 101 | Temp 97.7°F | Ht 75.0 in | Wt 368.0 lb

## 2023-06-16 DIAGNOSIS — E1159 Type 2 diabetes mellitus with other circulatory complications: Secondary | ICD-10-CM

## 2023-06-16 DIAGNOSIS — E1169 Type 2 diabetes mellitus with other specified complication: Secondary | ICD-10-CM

## 2023-06-16 DIAGNOSIS — E1165 Type 2 diabetes mellitus with hyperglycemia: Secondary | ICD-10-CM | POA: Diagnosis not present

## 2023-06-16 DIAGNOSIS — Z794 Long term (current) use of insulin: Secondary | ICD-10-CM | POA: Diagnosis not present

## 2023-06-16 DIAGNOSIS — E785 Hyperlipidemia, unspecified: Secondary | ICD-10-CM

## 2023-06-16 DIAGNOSIS — I152 Hypertension secondary to endocrine disorders: Secondary | ICD-10-CM | POA: Diagnosis not present

## 2023-06-16 DIAGNOSIS — E559 Vitamin D deficiency, unspecified: Secondary | ICD-10-CM

## 2023-06-16 DIAGNOSIS — Z7985 Long-term (current) use of injectable non-insulin antidiabetic drugs: Secondary | ICD-10-CM

## 2023-06-16 DIAGNOSIS — Z6841 Body Mass Index (BMI) 40.0 and over, adult: Secondary | ICD-10-CM

## 2023-06-16 DIAGNOSIS — G4733 Obstructive sleep apnea (adult) (pediatric): Secondary | ICD-10-CM

## 2023-06-16 DIAGNOSIS — E538 Deficiency of other specified B group vitamins: Secondary | ICD-10-CM

## 2023-06-16 MED ORDER — VITAMIN D (ERGOCALCIFEROL) 1.25 MG (50000 UNIT) PO CAPS
50000.0000 [IU] | ORAL_CAPSULE | ORAL | 0 refills | Status: DC
Start: 2023-06-16 — End: 2023-07-14

## 2023-06-16 MED ORDER — TIRZEPATIDE 5 MG/0.5ML ~~LOC~~ SOAJ: 5.0000 mg | SUBCUTANEOUS | 0 refills | Status: DC

## 2023-06-17 ENCOUNTER — Encounter (INDEPENDENT_AMBULATORY_CARE_PROVIDER_SITE_OTHER): Payer: Self-pay | Admitting: Physician Assistant

## 2023-06-24 ENCOUNTER — Ambulatory Visit: Payer: BC Managed Care – PPO | Admitting: Podiatry

## 2023-06-26 ENCOUNTER — Encounter: Payer: Self-pay | Admitting: Podiatry

## 2023-06-26 ENCOUNTER — Ambulatory Visit (INDEPENDENT_AMBULATORY_CARE_PROVIDER_SITE_OTHER): Payer: BC Managed Care – PPO | Admitting: Podiatry

## 2023-06-26 DIAGNOSIS — I872 Venous insufficiency (chronic) (peripheral): Secondary | ICD-10-CM | POA: Diagnosis not present

## 2023-06-26 DIAGNOSIS — M79675 Pain in left toe(s): Secondary | ICD-10-CM

## 2023-06-26 DIAGNOSIS — B351 Tinea unguium: Secondary | ICD-10-CM

## 2023-06-26 DIAGNOSIS — E1165 Type 2 diabetes mellitus with hyperglycemia: Secondary | ICD-10-CM

## 2023-06-26 DIAGNOSIS — Z794 Long term (current) use of insulin: Secondary | ICD-10-CM

## 2023-06-26 DIAGNOSIS — M79674 Pain in right toe(s): Secondary | ICD-10-CM

## 2023-06-26 NOTE — Progress Notes (Signed)
This patient returns to my office for at risk foot care.  This patient requires this care by a professional since this patient will be at risk due to having diabetes and venous stasis.  This patient is unable to cut nails himself since the patient cannot reach his nails.These nails are painful walking and wearing shoes.  This patient presents for at risk foot care today. ? ?General Appearance  Alert, conversant and in no acute stress. ? ?Vascular  Dorsalis pedis and posterior tibial  pulses are not palpable  bilaterally due to severe swelling..  Capillary return is within normal limits  bilaterally. Temperature is within normal limits  bilaterally. ? ?Neurologic  Senn-Weinstein monofilament wire test within normal limits  bilaterally. Muscle power within normal limits bilaterally. ? ?Nails Thick disfigured discolored nails with subungual debris  from hallux to fifth toes bilaterally. No evidence of bacterial infection or drainage bilaterally. ? ?Orthopedic  No limitations of motion  feet .  No crepitus or effusions noted.  No bony pathology or digital deformities noted. ? ?Skin  normotropic skin with no porokeratosis noted bilaterally.  No signs of infections or ulcers noted.    ? ?Onychomycosis  Pain in right toes  Pain in left toes  Swelling legs/feet ? ?Consent was obtained for treatment procedures.   Mechanical debridement of nails 1-5  bilaterally performed with a nail nipper.  Filed with dremel without incident.  ? ? ?Return office visit   3 months                   Told patient to return for periodic foot care and evaluation due to potential at risk complications. ? ? ?Gregory Mayer DPM  ?

## 2023-07-01 ENCOUNTER — Other Ambulatory Visit: Payer: Self-pay | Admitting: Family Medicine

## 2023-07-03 ENCOUNTER — Ambulatory Visit: Payer: BC Managed Care – PPO | Admitting: Adult Health

## 2023-07-12 ENCOUNTER — Other Ambulatory Visit: Payer: Self-pay | Admitting: Family Medicine

## 2023-07-14 ENCOUNTER — Encounter (INDEPENDENT_AMBULATORY_CARE_PROVIDER_SITE_OTHER): Payer: Self-pay | Admitting: Physician Assistant

## 2023-07-14 ENCOUNTER — Ambulatory Visit (INDEPENDENT_AMBULATORY_CARE_PROVIDER_SITE_OTHER): Payer: BC Managed Care – PPO | Admitting: Physician Assistant

## 2023-07-14 VITALS — BP 124/80 | HR 97 | Temp 97.7°F | Ht 75.0 in | Wt 366.0 lb

## 2023-07-14 DIAGNOSIS — I152 Hypertension secondary to endocrine disorders: Secondary | ICD-10-CM

## 2023-07-14 DIAGNOSIS — E1165 Type 2 diabetes mellitus with hyperglycemia: Secondary | ICD-10-CM | POA: Diagnosis not present

## 2023-07-14 DIAGNOSIS — G4733 Obstructive sleep apnea (adult) (pediatric): Secondary | ICD-10-CM

## 2023-07-14 DIAGNOSIS — E1159 Type 2 diabetes mellitus with other circulatory complications: Secondary | ICD-10-CM | POA: Diagnosis not present

## 2023-07-14 DIAGNOSIS — E559 Vitamin D deficiency, unspecified: Secondary | ICD-10-CM

## 2023-07-14 DIAGNOSIS — Z6841 Body Mass Index (BMI) 40.0 and over, adult: Secondary | ICD-10-CM

## 2023-07-14 DIAGNOSIS — Z794 Long term (current) use of insulin: Secondary | ICD-10-CM

## 2023-07-14 DIAGNOSIS — F17210 Nicotine dependence, cigarettes, uncomplicated: Secondary | ICD-10-CM

## 2023-07-14 DIAGNOSIS — I5032 Chronic diastolic (congestive) heart failure: Secondary | ICD-10-CM | POA: Diagnosis not present

## 2023-07-14 DIAGNOSIS — Z720 Tobacco use: Secondary | ICD-10-CM

## 2023-07-14 DIAGNOSIS — Z7985 Long-term (current) use of injectable non-insulin antidiabetic drugs: Secondary | ICD-10-CM

## 2023-07-14 DIAGNOSIS — G4730 Sleep apnea, unspecified: Secondary | ICD-10-CM

## 2023-07-14 MED ORDER — VITAMIN D (ERGOCALCIFEROL) 1.25 MG (50000 UNIT) PO CAPS: 50000.0000 [IU] | ORAL_CAPSULE | ORAL | 0 refills | Status: DC

## 2023-07-14 MED ORDER — TIRZEPATIDE 7.5 MG/0.5ML ~~LOC~~ SOAJ
7.5000 mg | SUBCUTANEOUS | 0 refills | Status: DC
Start: 2023-07-14 — End: 2023-08-12

## 2023-07-14 NOTE — Progress Notes (Signed)
.smr  Office: [phone]  /  Fax: [phone]  WEIGHT SUMMARY AND BIOMETRICS  Vitals Temp: 97.7 F (36.5 C) BP: 124/80 Pulse Rate: 97 SpO2: 97 %   Anthropometric Measurements Height: 6\' 3"  (1.905 m) Weight: (!) 366 lb (166 kg) BMI (Calculated): 45.75 Weight at Last Visit: 368 lb Weight Lost Since Last Visit: 2 Weight Gained Since Last Visit: 0 Starting Weight: 383 lb Total Weight Loss (lbs): 17 lb (7.711 kg) Peak Weight: 383 lb   Body Composition  Body Fat %: 44.7 % Fat Mass (lbs): 164 lbs Muscle Mass (lbs): 193 lbs Visceral Fat Rating : 30   Other Clinical Data Fasting: yes Labs: no Today's Visit #: 12 Starting Date: 07/31/22     HPI  Chief Complaint: OBESITY  Luis [name] is here to discuss his progress with his obesity treatment plan. He is on the the Category 4 Plan and states he is following his eating plan approximately 35 % of the time. He states he is exercising walking 5-10 minutes 2 times per week.  Discussed the use of AI scribe software for clinical note transcription with the patient, who gave verbal consent to proceed.  History of Present Illness  /   Interval History:  Since last office visit he down 2 lbs.    The patient, with a history of obesity, type 2 diabetes, hypercholesterolemia, and vitamin D deficiency, presents for a follow-up visit. He reports a decrease in appetite, which he attributes to recent personal losses and stress. Despite this, he has been making an effort to eat regularly. He has been experiencing diarrhea, which he believes may be related to something that he ate and this has resolved. The patient is a smoker and has been trying to reduce his smoking. He has been experiencing swelling in his ankles. He has a sleep study scheduled due to concerns about sleep apnea. The patient's blood sugar levels have been relatively well-controlled, but his vitamin D levels remain low despite supplementation. His activity level has increased  due to walking more.  Pharmacotherapy: Mounjaro 5 mg weekly for type 2 diabetes. Denies mass in neck, dysphagia, dyspepsia, persistent hoarseness, abdominal pain, or N/V/Constipation or diarrhea. Has annual eye exam. Mood is stable.   TREATMENT PLAN FOR OBESITY:  Recommended Dietary Goals  Luis [name] is currently in the action stage of change. As such, his goal is to continue weight management plan. He has agreed to the Category 4 Plan.  Behavioral Intervention  We discussed the following Behavioral Modification Strategies today: increasing lean protein intake, decreasing simple carbohydrates , increasing vegetables, increasing lower glycemic fruits, avoiding skipping meals, increasing water intake, identifying sources and decreasing liquid calories, decreasing eating out or consumption of processed foods, and making healthy choices when eating convenient foods, emotional eating strategies and understanding the difference between hunger signals and cravings, work on managing stress, creating time for self-care and relaxation measures, continue to practice mindfulness when eating, and planning for success.  Additional resources provided today: NA  Recommended Physical Activity Goals  Luis [name] has been advised to work up to 150 minutes of moderate intensity aerobic activity a week and strengthening exercises 2-3 times per week for cardiovascular health, weight loss maintenance and preservation of muscle mass.   He has agreed to Continue current level of physical activity  and Think about ways to increase daily physical activity and overcoming barriers to exercise Working on walking 15 minutes 3 times weekly.    Pharmacotherapy We discussed various medication options to help Luis [name] with  his weight loss efforts and we both agreed to increase Mounjaro to 7.5 mg weekly .    Return in about 4 weeks (around 08/11/2023).Marland Kitchen He was informed of the importance of frequent follow up visits to maximize his  success with intensive lifestyle modifications for his multiple health conditions.  PHYSICAL EXAM:  Blood pressure 124/80, pulse 97, temperature 97.7 F (36.5 C), height 6\' 3"  (1.905 m), weight (!) 366 lb (166 kg), SpO2 97%. Body mass index is 45.75 kg/m.  General: He is overweight, cooperative, alert, well developed, and in no acute distress. PSYCH: Has normal mood, affect and thought process.   Cardiovascular: HR 90's BP 124/80 . + ankle and pedal edema Lungs: Normal breathing effort, no conversational dyspnea.  DIAGNOSTIC DATA REVIEWED:  BMET    Component Value Date/Time   NA 141 06/16/2023 1021   K 3.9 06/16/2023 1021   CL 103 06/16/2023 1021   CO2 24 06/16/2023 1021   GLUCOSE 118 (H) 06/16/2023 1021   GLUCOSE 136 (H) 05/13/2023 0635   BUN 13 06/16/2023 1021   CREATININE 1.05 06/16/2023 1021   CREATININE 1.25 08/23/2020 1002   CALCIUM 8.9 06/16/2023 1021   GFRNONAA 58 (L) 05/13/2023 0635   GFRAA >60 10/15/2019 1513   Lab Results  Component Value Date   HGBA1C 6.8 (H) 06/16/2023   HGBA1C 7.8 (H) 03/20/2016   Lab Results  Component Value Date   INSULIN 18.4 03/16/2023   INSULIN 36.2 (H) 07/31/2022   Lab Results  Component Value Date   TSH 1.560 07/31/2022   CBC    Component Value Date/Time   WBC 7.3 05/13/2023 0635   RBC 4.82 05/13/2023 0635   HGB 15.4 05/13/2023 0635   HGB 13.9 03/16/2023 0908   HCT 46.8 05/13/2023 0635   HCT 42.2 03/16/2023 0908   PLT 227 05/13/2023 0635   PLT 229 03/16/2023 0908   MCV 97.1 05/13/2023 0635   MCV 95 03/16/2023 0908   MCH 32.0 05/13/2023 0635   MCHC 32.9 05/13/2023 0635   RDW 14.2 05/13/2023 0635   RDW 14.3 03/16/2023 0908   Iron Studies No results found for: "IRON", "TIBC", "FERRITIN", "IRONPCTSAT" Lipid Panel     Component Value Date/Time   CHOL 133 03/16/2023 0908   TRIG 127 03/16/2023 0908   HDL 33 (L) 03/16/2023 0908   CHOLHDL 4.5 07/31/2022 1029   CHOLHDL 4 10/03/2021 1444   VLDL 16.8 10/03/2021 1444    LDLCALC 77 03/16/2023 0908   LDLCALC 96 08/23/2020 1002   Hepatic Function Panel     Component Value Date/Time   PROT 7.0 06/16/2023 1021   ALBUMIN 4.0 06/16/2023 1021   AST 11 06/16/2023 1021   ALT 10 06/16/2023 1021   ALKPHOS 123 (H) 06/16/2023 1021   BILITOT 0.6 06/16/2023 1021   BILIDIR 0.2 12/20/2017 1356   IBILI 1.0 (H) 12/20/2017 1356      Component Value Date/Time   TSH 1.560 07/31/2022 1029   Nutritional Lab Results  Component Value Date   VD25OH 21.8 (L) 06/16/2023   VD25OH 10.5 (L) 03/16/2023   VD25OH 9.4 (L) 07/31/2022    ASSOCIATED CONDITIONS ADDRESSED TODAY  ASSESSMENT AND PLAN  Problem List Items Addressed This Visit     Chronic diastolic CHF (congestive heart failure) (HCC), ECHO 03/2017 EF 50-55%, on Torsemide   Morbid obesity (HCC)   Relevant Medications   tirzepatide (MOUNJARO) 7.5 MG/0.5ML Pen   Vitamin D deficiency   Relevant Medications   Vitamin D, Ergocalciferol, (DRISDOL) 1.25 MG (  50000 UNIT) CAPS capsule (Start on 07/16/2023)   BMI 45.0-49.9, adult (HCC) Current BMI 46.4   Relevant Medications   tirzepatide (MOUNJARO) 7.5 MG/0.5ML Pen   Hypertension associated with diabetes (HCC) (Chronic)   Relevant Medications   tirzepatide (MOUNJARO) 7.5 MG/0.5ML Pen   Diabetes (HCC) - Primary (Chronic)   Relevant Medications   tirzepatide (MOUNJARO) 7.5 MG/0.5ML Pen  Obesity Weight loss achieved through decreased appetite and increased physical activity. Discussed the importance of maintaining a balanced diet despite decreased appetite due to medication. -Increase Mounjaro to 7.5mg  for next injection. -Encourage continued physical activity, including walking and "Walking with [employer].  Type 2 Diabetes Blood sugars have been well-controlled, ranging from 101-135. -Continue current management and monitor blood sugars.   Vitamin D Deficiency Despite supplementation, Vitamin D levels remain low (21.8). -Increase Vitamin D supplementation  to Ergocalciferol 50 K units twice weekly. -Recheck Vitamin D levels in several months.  Hx CHF with preserved EF/Chronic Venous Insufficiency Persistent lower extremity swelling despite medication and use of compression stockings. -Encourage increased physical activity to promote venous return. -Consider use of a wedge to elevate legs during sleep.  Tobacco Use Decreased desire to smoke, possibly related to medication effects. -Encourage continued efforts to quit smoking.  Sleep Apnea Sleep study scheduled for October. -Encourage patient to complete sleep study and follow up as needed.  General Health Maintenance -Encourage high protein diet and regular meals to maintain metabolic rate. -Consider adding prebiotic fiber to diet for bowel health. -Follow up with primary care provider (Dr. Jacquiline Doe) as scheduled.  ATTESTASTION STATEMENTS:  Reviewed by clinician on day of visit: allergies, medications, problem list, medical history, surgical history, family history, social history, and previous encounter notes.   I have personally spent 30 minutes total time today in preparation, patient care, nutritional counseling and documentation for this visit, including the following: review of clinical lab tests; review of medical tests/procedures/services.      Rhina Kramme, PA-C

## 2023-07-22 ENCOUNTER — Ambulatory Visit (HOSPITAL_BASED_OUTPATIENT_CLINIC_OR_DEPARTMENT_OTHER): Payer: BC Managed Care – PPO | Attending: Adult Health | Admitting: Pulmonary Disease

## 2023-07-22 DIAGNOSIS — G4733 Obstructive sleep apnea (adult) (pediatric): Secondary | ICD-10-CM | POA: Diagnosis not present

## 2023-08-06 DIAGNOSIS — G4733 Obstructive sleep apnea (adult) (pediatric): Secondary | ICD-10-CM

## 2023-08-06 NOTE — Procedures (Signed)
Patient Name: Luis [name], Luis [name] Date: 07/22/2023 Gender: Male D.O.B: 1966-06-29 Age (years): 80 Referring Provider: Tammy Parrett Height (inches): 75 Interpreting Physician: Cyril Mourning MD, ABSM Weight (lbs): 366 RPSGT: Armen Pickup BMI: 46 MRN: [id number] Neck Size: 19.00 <br> <br> CLINICAL INFORMATION Sleep Study Type: NPSG    Indication for sleep study: Diabetes, reassessment of OSA  Home sleep study July 2017 showed mild sleep apnea with AHI of 14/hour   CPAP titration study on July 29, 2017 showed optimal control at 14 cm H2O.   Epworth Sleepiness Score: 10     SLEEP STUDY TECHNIQUE As per the AASM Manual for the Scoring of Sleep and Associated Events v2.3 (April 2016) with a hypopnea requiring 4% desaturations.  The channels recorded and monitored were frontal, central and occipital EEG, electrooculogram (EOG), submentalis EMG (chin), nasal and oral airflow, thoracic and abdominal wall motion, anterior tibialis EMG, snore microphone, electrocardiogram, and pulse oximetry.  MEDICATIONS Medications self-administered by patient taken the night of the study : N/A  SLEEP ARCHITECTURE The study was initiated at 10:43:54 PM and ended at 4:38:35 AM.  Sleep onset time was 88.7 minutes and the sleep efficiency was 55.5%. The total sleep time was 197 minutes.  Stage REM latency was 221.5 minutes.  The patient spent 20.8% of the night in stage N1 sleep, 72.6% in stage N2 sleep, 0.0% in stage N3 and 6.6% in REM.  Alpha intrusion was absent.  Supine sleep was 15.99%.  RESPIRATORY PARAMETERS The overall apnea/hypopnea index (AHI) was 84.4 per hour. There were 99 total apneas, including 91 obstructive, 7 central and 1 mixed apneas. There were 178 hypopneas and 47 RERAs.  The AHI during Stage REM sleep was 110.8 per hour.  AHI while supine was 102.9 per hour.  The mean oxygen saturation was 91.3%. The minimum SpO2 during sleep was 84.0%.  moderate snoring was  noted during this study.  CARDIAC DATA The 2 lead EKG demonstrated sinus rhythm. The mean heart rate was 69.0 beats per minute. Other EKG findings include: None.   LEG MOVEMENT DATA The total PLMS were 0 with a resulting PLMS index of 0.0. Associated arousal with leg movement index was 0.0 .  IMPRESSIONS - Severe obstructive sleep apnea occurred during this study (AHI = 84.4/h). Unfortunately split could not be performed due to not enough sleep time - No significant central sleep apnea occurred during this study (CAI = 2.1/h). - Mild oxygen desaturation was noted during this study (Min O2 = 84.0%). - The patient snored with moderate snoring volume. - No cardiac abnormalities were noted during this study. - Clinically significant periodic limb movements did not occur during sleep. No significant associated arousals.   DIAGNOSIS - Obstructive Sleep Apnea (G47.33)   RECOMMENDATIONS - Therapeutic CPAP titration to determine optimal pressure required to alleviate sleep disordered breathing. Alternatively autoCPAP can be used with med FF mask based on his earlier titration study - Avoid alcohol, sedatives and other CNS depressants that may worsen sleep apnea and disrupt normal sleep architecture. - Sleep hygiene should be reviewed to assess factors that may improve sleep quality. - Weight management and regular exercise should be initiated or continued if appropriate.  [Electronically signed] 08/06/2023 05:57 PM  Cyril Mourning MD, ABSM Diplomate, American Board of Sleep Medicine NPI: [id number]

## 2023-08-07 NOTE — Telephone Encounter (Signed)
Margie see if he can  do virtual today to review and start tx

## 2023-08-07 NOTE — Telephone Encounter (Signed)
Called mobile number on file- no answer and no option to leave vm-mailbox is full. Called work number on file and spoke to patient's sister,Luis [name](DPR). She stated that she would contact patient and have him return our call. Mychart message sent as well.

## 2023-08-11 NOTE — Progress Notes (Unsigned)
.sm[facility]  Office: [phone]  /  Fax: [phone]  WEIGHT SUMMARY AND BIOMETRICS  Vitals Temp: 97.9 F (36.6 C) BP: 98/61 Pulse Rate: 94 SpO2: 94 %   Anthropometric Measurements Height: 6\' 3"  (1.905 m) Weight: (!) 362 lb (164.2 kg) BMI (Calculated): 45.25 Weight at Last Visit: 366 lb Weight Lost Since Last Visit: 4 lb Weight Gained Since Last Visit: 0 Starting Weight: 383 lb Total Weight Loss (lbs): 21 lb (9.526 kg) Peak Weight: 383 lb   Body Composition  Body Fat %: 44.3 % Fat Mass (lbs): 160.8 lbs Muscle Mass (lbs): 192.2 lbs Visceral Fat Rating : 30   Other Clinical Data Fasting: yes Labs: no Today's Visit #: 13 Starting Date: 07/31/22     HPI  Chief Complaint: OBESITY  Luis [name] is here to discuss his progress with his obesity treatment plan. He is on the the Category 4 Plan and states he is following his eating plan approximately 40 % of the time. He states he is exercising walking  2 times per week.  Discussed the use of AI scribe software for clinical note transcription with the patient, who gave verbal consent to proceed.  History of Present Illness/     Interval History:  Since last office visit he is down 4 lbs.  Down a total of 21 lbs since 07/31/22 TBW loss of 5.5%   The patient, with a history of obesity, type two diabetes, vitamin D deficiency, and chronic diastolic heart failure, presents for a follow-up visit. He reports feeling good overall and has noticed weight loss, which he attributes to changes in his diet and increased physical activity. He has been trying to adhere to a meal plan, with a focus on increasing vegetable intake and reducing meat consumption. He also reports a significant decrease in his appetite, which he attributes to his medication. Despite not fully adhering to the meal plan, he has made significant dietary changes, including increased water intake and reduced soda consumption.  The patient also reports persistent leg  swelling, which he manages with compression stockings. He notes that the swelling does not worsen but also does not improve significantly. He has been using a wedge in bed to elevate his legs and notices that his legs feel lighter in the morning after using the wedge. However, he does not use the wedge throughout the night as it interferes with his sleep.  In terms of physical activity, the patient reports increased walking due to attending his grandson's football games. He also plans to start using a peddler for additional exercise. Despite these positive changes, the patient acknowledges that he needs to increase his physical activity and focus more on his meal plan.   Pharmacotherapy: Mounjaro 7.5 mg weekly. Denies mass in neck, dysphagia, dyspepsia, persistent hoarseness, abdominal pain, or N/V/Constipation or diarrhea. Has annual eye exam. Mood is stable.    TREATMENT PLAN FOR OBESITY:  Recommended Dietary Goals  Luis [name] is currently in the action stage of change. As such, his goal is to continue weight management plan. He has agreed to the Category 4 Plan.  Behavioral Intervention  We discussed the following Behavioral Modification Strategies today: continue to work on maintaining a reduced calorie state, getting the recommended amount of protein, incorporating whole foods, making healthy choices, staying well hydrated and practicing mindfulness when eating..  Additional resources provided today: NA  Recommended Physical Activity Goals  Luis [name] has been advised to work up to 150 minutes of moderate intensity aerobic activity a week and strengthening exercises  2-3 times per week for cardiovascular health, weight loss maintenance and preservation of muscle mass.   He has agreed to Think about enjoyable ways to increase daily physical activity and overcoming barriers to exercise and Increase physical activity in their day and reduce sedentary time (increase NEAT).   Pharmacotherapy We  discussed various medication options to help Luis [name] with his weight loss efforts and we both agreed to continue Mounjaro 7.5 mg weekly. .    Return in about 4 weeks (around 09/09/2023) for Fasting Lab.Marland Kitchen He was informed of the importance of frequent follow up visits to maximize his success with intensive lifestyle modifications for his multiple health conditions.  PHYSICAL EXAM:  Blood pressure 98/61, pulse 94, temperature 97.9 F (36.6 C), height 6\' 3"  (1.905 m), weight (!) 362 lb (164.2 kg), SpO2 94%. Body mass index is 45.25 kg/m.  General: He is overweight, cooperative, alert, well developed, and in no acute distress. PSYCH: Has normal mood, affect and thought process.   Cardiovascular: HR 90's BP 98/61- asymptomatic Lungs: Normal breathing effort, no conversational dyspnea. Neuro: no focal deficits  DIAGNOSTIC DATA REVIEWED:  BMET    Component Value Date/Time   NA 141 06/16/2023 1021   K 3.9 06/16/2023 1021   CL 103 06/16/2023 1021   CO2 24 06/16/2023 1021   GLUCOSE 118 (H) 06/16/2023 1021   GLUCOSE 136 (H) 05/13/2023 0635   BUN 13 06/16/2023 1021   CREATININE 1.05 06/16/2023 1021   CREATININE 1.25 08/23/2020 1002   CALCIUM 8.9 06/16/2023 1021   GFRNONAA 58 (L) 05/13/2023 0635   GFRAA >60 10/15/2019 1513   Lab Results  Component Value Date   HGBA1C 6.8 (H) 06/16/2023   HGBA1C 7.8 (H) 03/20/2016   Lab Results  Component Value Date   INSULIN 18.4 03/16/2023   INSULIN 36.2 (H) 07/31/2022   Lab Results  Component Value Date   TSH 1.560 07/31/2022   CBC    Component Value Date/Time   WBC 7.3 05/13/2023 0635   RBC 4.82 05/13/2023 0635   HGB 15.4 05/13/2023 0635   HGB 13.9 03/16/2023 0908   HCT 46.8 05/13/2023 0635   HCT 42.2 03/16/2023 0908   PLT 227 05/13/2023 0635   PLT 229 03/16/2023 0908   MCV 97.1 05/13/2023 0635   MCV 95 03/16/2023 0908   MCH 32.0 05/13/2023 0635   MCHC 32.9 05/13/2023 0635   RDW 14.2 05/13/2023 0635   RDW 14.3 03/16/2023 0908    Iron Studies No results found for: "IRON", "TIBC", "FERRITIN", "IRONPCTSAT" Lipid Panel     Component Value Date/Time   CHOL 133 03/16/2023 0908   TRIG 127 03/16/2023 0908   HDL 33 (L) 03/16/2023 0908   CHOLHDL 4.5 07/31/2022 1029   CHOLHDL 4 10/03/2021 1444   VLDL 16.8 10/03/2021 1444   LDLCALC 77 03/16/2023 0908   LDLCALC 96 08/23/2020 1002   Hepatic Function Panel     Component Value Date/Time   PROT 7.0 06/16/2023 1021   ALBUMIN 4.0 06/16/2023 1021   AST 11 06/16/2023 1021   ALT 10 06/16/2023 1021   ALKPHOS 123 (H) 06/16/2023 1021   BILITOT 0.6 06/16/2023 1021   BILIDIR 0.2 12/20/2017 1356   IBILI 1.0 (H) 12/20/2017 1356      Component Value Date/Time   TSH 1.560 07/31/2022 1029   Nutritional Lab Results  Component Value Date   VD25OH 21.8 (L) 06/16/2023   VD25OH 10.5 (L) 03/16/2023   VD25OH 9.4 (L) 07/31/2022    ASSOCIATED CONDITIONS ADDRESSED TODAY  ASSESSMENT AND PLAN  Problem List Items Addressed This Visit     Chronic diastolic CHF (congestive heart failure) (HCC), ECHO 03/2017 EF 50-55%, on Torsemide   Relevant Medications   carvedilol (COREG) 12.5 MG tablet   Morbid obesity (HCC)   Relevant Medications   tirzepatide (MOUNJARO) 7.5 MG/0.5ML Pen   Vitamin D deficiency   Relevant Medications   Vitamin D, Ergocalciferol, (DRISDOL) 1.25 MG (50000 UNIT) CAPS capsule (Start on 08/13/2023)   BMI 45.0-49.9, adult (HCC) Current BMI 46.4   Relevant Medications   tirzepatide (MOUNJARO) 7.5 MG/0.5ML Pen   Hypertension associated with diabetes (HCC) (Chronic)   Relevant Medications   carvedilol (COREG) 12.5 MG tablet   tirzepatide (MOUNJARO) 7.5 MG/0.5ML Pen   Diabetes (HCC) - Primary (Chronic)   Relevant Medications   tirzepatide (MOUNJARO) 7.5 MG/0.5ML Pen  Obesity Patient has lost 4 pounds since last visit. Adherence to meal plan is approximately 40%, with good intake of vegetables and Luis [name] apples as snacks. Patient reports decreased appetite  due to medication. Patient plans to increase adherence to meal plan and incorporate more physical activity. -Continue current obesity treatment plan. -Encourage patient to adhere to meal plan and increase physical activity. -Refill Mounjaro prescription.  Type 2 Diabetes Last A1c was satisfactory. Patient reports decreased appetite due to medication. -Continue current diabetes management plan. Continue /refill Mounjaro 7.5 mg weekly.  -Check A1c at next visit before Thanksgiving.  Chronic Diastolic Heart Failure Patient reports persistent leg swelling. Patient has been using a wedge for leg elevation but not consistently throughout the night. Patient reports legs feel lighter in the morning after using the wedge. -Continue/refill Carvedilol at current dose. -Encourage patient to wear compression stockings first thing in the morning before getting up. -Consider adjusting Carvedilol dose if blood pressure remains low.  Vitamin D Deficiency On Ergocalciferol 50,000 units twice weekly.  Low vitamin D levels can be associated with adiposity and may result in leptin resistance and weight gain. Also associated with fatigue. Currently on vitamin D supplementation without any adverse effects.  Continue and refill ergocalciferol 50,000 units twice daily. Recheck vitamin D level next visit.   General Health Maintenance -Encourage patient to hydrate well. -Encourage patient to incorporate strength training into exercise routine. -Plan to check labs at next visit before Thanksgiving.  ATTESTASTION STATEMENTS:  Reviewed by clinician on day of visit: allergies, medications, problem list, medical history, surgical history, family history, social history, and previous encounter notes.   I have personally spent 32 minutes total time today in preparation, patient care, nutritional counseling and documentation for this visit, including the following: review of clinical lab tests; review of medical  tests/procedures/services.      Nella Botsford, PA-C

## 2023-08-12 ENCOUNTER — Encounter (INDEPENDENT_AMBULATORY_CARE_PROVIDER_SITE_OTHER): Payer: Self-pay | Admitting: Physician Assistant

## 2023-08-12 ENCOUNTER — Ambulatory Visit (INDEPENDENT_AMBULATORY_CARE_PROVIDER_SITE_OTHER): Payer: BC Managed Care – PPO | Admitting: Physician Assistant

## 2023-08-12 VITALS — BP 98/61 | HR 94 | Temp 97.9°F | Ht 75.0 in | Wt 362.0 lb

## 2023-08-12 DIAGNOSIS — E1165 Type 2 diabetes mellitus with hyperglycemia: Secondary | ICD-10-CM

## 2023-08-12 DIAGNOSIS — E1159 Type 2 diabetes mellitus with other circulatory complications: Secondary | ICD-10-CM | POA: Diagnosis not present

## 2023-08-12 DIAGNOSIS — I152 Hypertension secondary to endocrine disorders: Secondary | ICD-10-CM

## 2023-08-12 DIAGNOSIS — E559 Vitamin D deficiency, unspecified: Secondary | ICD-10-CM

## 2023-08-12 DIAGNOSIS — I5032 Chronic diastolic (congestive) heart failure: Secondary | ICD-10-CM

## 2023-08-12 DIAGNOSIS — Z7985 Long-term (current) use of injectable non-insulin antidiabetic drugs: Secondary | ICD-10-CM

## 2023-08-12 DIAGNOSIS — Z794 Long term (current) use of insulin: Secondary | ICD-10-CM

## 2023-08-12 DIAGNOSIS — Z6841 Body Mass Index (BMI) 40.0 and over, adult: Secondary | ICD-10-CM

## 2023-08-12 MED ORDER — VITAMIN D (ERGOCALCIFEROL) 1.25 MG (50000 UNIT) PO CAPS
50000.0000 [IU] | ORAL_CAPSULE | ORAL | 0 refills | Status: DC
Start: 2023-08-13 — End: 2023-09-15

## 2023-08-12 MED ORDER — CARVEDILOL 12.5 MG PO TABS: 12.5000 mg | ORAL_TABLET | Freq: Two times a day (BID) | ORAL | 3 refills | Status: DC

## 2023-08-12 MED ORDER — TIRZEPATIDE 7.5 MG/0.5ML ~~LOC~~ SOAJ: 7.5000 mg | SUBCUTANEOUS | 0 refills | Status: DC

## 2023-08-14 ENCOUNTER — Ambulatory Visit: Payer: BC Managed Care – PPO | Admitting: Adult Health

## 2023-08-14 ENCOUNTER — Encounter: Payer: Self-pay | Admitting: Adult Health

## 2023-08-14 VITALS — BP 140/70 | HR 87 | Ht 75.0 in | Wt 373.0 lb

## 2023-08-14 DIAGNOSIS — G4733 Obstructive sleep apnea (adult) (pediatric): Secondary | ICD-10-CM

## 2023-08-14 NOTE — Patient Instructions (Signed)
Begin CPAP at bedtime goal is to wear all night long for at least 6 or more hours Work on healthy weight loss Do not drive if sleepy  Follow up in 3 months and As needed

## 2023-08-14 NOTE — Assessment & Plan Note (Signed)
Healthy weight loss discussed 

## 2023-08-14 NOTE — Assessment & Plan Note (Signed)
Very severe obstructive sleep apnea.  We went over his recent sleep study results.  Unfortunately was not able to do the therapeutic CPAP at the split-night.  Will begin auto CPAP 8 to 20 cm H2O.  With close follow-up.  If unable to get optimal control we will need to refer back to the sleep lab for a CPAP titration study.  Plan  Patient Instructions  Begin CPAP at bedtime goal is to wear all night long for at least 6 or more hours Work on healthy weight loss  Do not drive if sleepy  Follow up in 3 months and As needed

## 2023-08-14 NOTE — Progress Notes (Signed)
lymphadenopathy.    RESP  Clear  P & A; w/o, wheezes/ rales/ or rhonchi. no accessory muscle use, no dullness to percussion  CARD:  RRR, no m/r/g, 1+peripheral edema, pulses intact, no cyanosis or clubbing.  GI:   Soft & nt; nml bowel sounds; no organomegaly or masses detected.   Musco: Warm bil, no deformities or joint swelling noted.   Neuro: alert, no focal deficits noted.    Skin: Warm, no lesions or rashes    Lab Results:  CBC    BNP   Imaging: Sleep Study Documents  Result Date: 07/28/2023 Ordered by an unspecified provider.  Split night study  Result Date: 07/22/2023 Luis Milch, MD     08/06/2023  5:58 PM Patient Name: Luis [name], Luis [name] Date: 07/22/2023 Gender: Male D.O.B: 1966-10-24 Age (years): 88 Referring Provider: Lisha [name] Height (inches): 75 Interpreting Physician: Luis Mourning MD, ABSM Weight (lbs): 366 RPSGT: Luis [name] BMI: 46 MRN: [id number] Neck Size: 19.00 <br> <br> CLINICAL INFORMATION Sleep Study Type: NPSG Indication for sleep study: Diabetes, reassessment of OSA Home sleep study July 2017 showed mild sleep apnea with AHI of 14/hour  CPAP titration study on July 29, 2017 showed optimal control at 14 cm H2O. Epworth Sleepiness Score: 10 SLEEP STUDY TECHNIQUE As per the AASM Manual for the Scoring of Sleep and Associated Events v2.3 (April 2016) with a hypopnea requiring 4% desaturations. The channels recorded and monitored were frontal, central and  occipital EEG, electrooculogram (EOG), submentalis EMG (chin), nasal and oral airflow, thoracic and abdominal wall motion, anterior tibialis EMG, snore microphone, electrocardiogram, and pulse oximetry. MEDICATIONS Medications self-administered by patient taken the night of the study : N/A SLEEP ARCHITECTURE The study was initiated at 10:43:54 PM and ended at 4:38:35 AM. Sleep onset time was 88.7 minutes and the sleep efficiency was 55.5%. The total sleep time was 197 minutes. Stage REM latency was 221.5 minutes. The patient spent 20.8% of the night in stage N1 sleep, 72.6% in stage N2 sleep, 0.0% in stage N3 and 6.6% in REM. Alpha intrusion was absent. Supine sleep was 15.99%. RESPIRATORY PARAMETERS The overall apnea/hypopnea index (AHI) was 84.4 per hour. There were 99 total apneas, including 91 obstructive, 7 central and 1 mixed apneas. There were 178 hypopneas and 47 RERAs. The AHI during Stage REM sleep was 110.8 per hour. AHI while supine was 102.9 per hour. The mean oxygen saturation was 91.3%. The minimum SpO2 during sleep was 84.0%. moderate snoring was noted during this study. CARDIAC DATA The 2 lead EKG demonstrated sinus rhythm. The mean heart rate was 69.0 beats per minute. Other EKG findings include: None. LEG MOVEMENT DATA The total PLMS were 0 with a resulting PLMS index of 0.0. Associated arousal with leg movement index was 0.0 . IMPRESSIONS - Severe obstructive sleep apnea occurred during this study (AHI = 84.4/h). Unfortunately split could not be performed due to not enough sleep time - No significant central sleep apnea occurred during this study (CAI = 2.1/h). - Mild oxygen desaturation was noted during this study (Min O2 = 84.0%). - The patient snored with moderate snoring volume. - No cardiac abnormalities were noted during this study. - Clinically significant periodic limb movements did not occur during sleep. No significant associated arousals. DIAGNOSIS - Obstructive Sleep Apnea (G47.33)  RECOMMENDATIONS - Therapeutic CPAP titration to determine optimal pressure required to alleviate sleep disordered breathing. Alternatively autoCPAP can be used with med FF mask based on his earlier titration study - Avoid alcohol, sedatives and  @Patient  ID: Luis [name], male    DOB: 1966-01-25, 57 y.o.   MRN: [id number]  Chief Complaint  Patient presents with   Follow-up    Referring provider: Ardith Dark, MD  HPI: 57 yo male smoker seen for sleep consult 05/12/23 to reestablish for OSA  Former patient of Dr. Vassie [name]    TEST/EVENTS :  Hospitalized 4/29-5/15/2018  for acute respiratory failure requiring mechanical ventilation due to influenza pneumonia. He also has nonischemic cardiomyopathy with EF of 35-40% with a normal left heart cath in 03/2016 in chronic atrial fibrillation. He required mechanical ventilation from 4/29 -03/12/2017.    Home sleep study July 2017 showed mild sleep apnea with AHI of 14/hour   CPAP titration study on July 29, 2017 showed optimal control at 14 cm H2O.  echo in 2023 showed improved EF at 60 to 65%, grade 1 diastolic dysfunction. Moderate dilation of the RV.   08/14/2023  Follow up : OSA  Patient returns for 15-month follow-up.  Patient was seen last visit for a sleep consult to reestablish for sleep apnea.  Patient was having ongoing snoring daytime sleepiness and restless sleep.  He was set up for a in lab split-night study. Split night sleep study done on 07/22/23 showed severe obstructive sleep apnea with AHI at 84.4/hour, AHI in supine position was 102.9/hour and AHI during stage REM sleep was 110.8/hour, SpO2 low with 84%.  Unfortunately was not able to complete the CPAP titration portion of the study due to decreased sleep time.  We discussed his sleep study results in detail.  Went over treatment plan and need to begin CPAP therapy..     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 03/2016 Echo: EF 35-40%, diff HK, GrII DD; b. 03/2017 Echo: EF 50-55%; c. 12/2021 Echo: EF 60-65%, no rwma, GrI DD, mod enlarged RV w/ nl fxn, Ao root 38mm, Asc Ao 37mm.   Difficult intubation 12/06/2020   DM (diabetes mellitus) (HCC)    Edema    both ankles and feet   Essential hypertension    History of tobacco abuse    Hyperlipidemia    Morbid obesity (HCC)    NICM (nonischemic cardiomyopathy) (HCC)    a. 03/2016 Echo: EF 35-40%, diff HK, GrII DD; b. 03/2017 Echo: EF 50-55%; c. 12/2021 Echo: EF 60-65%, no rwma, GrI DD.   Non-obstructive CAD (coronary artery disease)    a. 03/2016 Cath: LM nl, LAD 35p, LCX nl, RCA nl. EF 35-45%.   Noncompliance    OSA (obstructive sleep apnea)    Pneumonia 2017   Premature atrial contractions    Shortness of breath    Sleep apnea     Tobacco History: Social History   Tobacco Use  Smoking Status Every Day   Current packs/day: 0.00   Average packs/day: 0.5 packs/day for 20.0 years (10.0 ttl pk-yrs)   Types: Cigarettes   Start date: 03/02/1989   Last attempt to quit: 03/02/2009  lymphadenopathy.    RESP  Clear  P & A; w/o, wheezes/ rales/ or rhonchi. no accessory muscle use, no dullness to percussion  CARD:  RRR, no m/r/g, 1+peripheral edema, pulses intact, no cyanosis or clubbing.  GI:   Soft & nt; nml bowel sounds; no organomegaly or masses detected.   Musco: Warm bil, no deformities or joint swelling noted.   Neuro: alert, no focal deficits noted.    Skin: Warm, no lesions or rashes    Lab Results:  CBC    BNP   Imaging: Sleep Study Documents  Result Date: 07/28/2023 Ordered by an unspecified provider.  Split night study  Result Date: 07/22/2023 Luis Milch, MD     08/06/2023  5:58 PM Patient Name: Luis [name], Luis [name] Date: 07/22/2023 Gender: Male D.O.B: 1966-10-24 Age (years): 88 Referring Provider: Lisha [name] Height (inches): 75 Interpreting Physician: Luis Mourning MD, ABSM Weight (lbs): 366 RPSGT: Luis [name] BMI: 46 MRN: [id number] Neck Size: 19.00 <br> <br> CLINICAL INFORMATION Sleep Study Type: NPSG Indication for sleep study: Diabetes, reassessment of OSA Home sleep study July 2017 showed mild sleep apnea with AHI of 14/hour  CPAP titration study on July 29, 2017 showed optimal control at 14 cm H2O. Epworth Sleepiness Score: 10 SLEEP STUDY TECHNIQUE As per the AASM Manual for the Scoring of Sleep and Associated Events v2.3 (April 2016) with a hypopnea requiring 4% desaturations. The channels recorded and monitored were frontal, central and  occipital EEG, electrooculogram (EOG), submentalis EMG (chin), nasal and oral airflow, thoracic and abdominal wall motion, anterior tibialis EMG, snore microphone, electrocardiogram, and pulse oximetry. MEDICATIONS Medications self-administered by patient taken the night of the study : N/A SLEEP ARCHITECTURE The study was initiated at 10:43:54 PM and ended at 4:38:35 AM. Sleep onset time was 88.7 minutes and the sleep efficiency was 55.5%. The total sleep time was 197 minutes. Stage REM latency was 221.5 minutes. The patient spent 20.8% of the night in stage N1 sleep, 72.6% in stage N2 sleep, 0.0% in stage N3 and 6.6% in REM. Alpha intrusion was absent. Supine sleep was 15.99%. RESPIRATORY PARAMETERS The overall apnea/hypopnea index (AHI) was 84.4 per hour. There were 99 total apneas, including 91 obstructive, 7 central and 1 mixed apneas. There were 178 hypopneas and 47 RERAs. The AHI during Stage REM sleep was 110.8 per hour. AHI while supine was 102.9 per hour. The mean oxygen saturation was 91.3%. The minimum SpO2 during sleep was 84.0%. moderate snoring was noted during this study. CARDIAC DATA The 2 lead EKG demonstrated sinus rhythm. The mean heart rate was 69.0 beats per minute. Other EKG findings include: None. LEG MOVEMENT DATA The total PLMS were 0 with a resulting PLMS index of 0.0. Associated arousal with leg movement index was 0.0 . IMPRESSIONS - Severe obstructive sleep apnea occurred during this study (AHI = 84.4/h). Unfortunately split could not be performed due to not enough sleep time - No significant central sleep apnea occurred during this study (CAI = 2.1/h). - Mild oxygen desaturation was noted during this study (Min O2 = 84.0%). - The patient snored with moderate snoring volume. - No cardiac abnormalities were noted during this study. - Clinically significant periodic limb movements did not occur during sleep. No significant associated arousals. DIAGNOSIS - Obstructive Sleep Apnea (G47.33)  RECOMMENDATIONS - Therapeutic CPAP titration to determine optimal pressure required to alleviate sleep disordered breathing. Alternatively autoCPAP can be used with med FF mask based on his earlier titration study - Avoid alcohol, sedatives and  lymphadenopathy.    RESP  Clear  P & A; w/o, wheezes/ rales/ or rhonchi. no accessory muscle use, no dullness to percussion  CARD:  RRR, no m/r/g, 1+peripheral edema, pulses intact, no cyanosis or clubbing.  GI:   Soft & nt; nml bowel sounds; no organomegaly or masses detected.   Musco: Warm bil, no deformities or joint swelling noted.   Neuro: alert, no focal deficits noted.    Skin: Warm, no lesions or rashes    Lab Results:  CBC    BNP   Imaging: Sleep Study Documents  Result Date: 07/28/2023 Ordered by an unspecified provider.  Split night study  Result Date: 07/22/2023 Luis Milch, MD     08/06/2023  5:58 PM Patient Name: Luis [name], Luis [name] Date: 07/22/2023 Gender: Male D.O.B: 1966-10-24 Age (years): 88 Referring Provider: Lisha [name] Height (inches): 75 Interpreting Physician: Luis Mourning MD, ABSM Weight (lbs): 366 RPSGT: Luis [name] BMI: 46 MRN: [id number] Neck Size: 19.00 <br> <br> CLINICAL INFORMATION Sleep Study Type: NPSG Indication for sleep study: Diabetes, reassessment of OSA Home sleep study July 2017 showed mild sleep apnea with AHI of 14/hour  CPAP titration study on July 29, 2017 showed optimal control at 14 cm H2O. Epworth Sleepiness Score: 10 SLEEP STUDY TECHNIQUE As per the AASM Manual for the Scoring of Sleep and Associated Events v2.3 (April 2016) with a hypopnea requiring 4% desaturations. The channels recorded and monitored were frontal, central and  occipital EEG, electrooculogram (EOG), submentalis EMG (chin), nasal and oral airflow, thoracic and abdominal wall motion, anterior tibialis EMG, snore microphone, electrocardiogram, and pulse oximetry. MEDICATIONS Medications self-administered by patient taken the night of the study : N/A SLEEP ARCHITECTURE The study was initiated at 10:43:54 PM and ended at 4:38:35 AM. Sleep onset time was 88.7 minutes and the sleep efficiency was 55.5%. The total sleep time was 197 minutes. Stage REM latency was 221.5 minutes. The patient spent 20.8% of the night in stage N1 sleep, 72.6% in stage N2 sleep, 0.0% in stage N3 and 6.6% in REM. Alpha intrusion was absent. Supine sleep was 15.99%. RESPIRATORY PARAMETERS The overall apnea/hypopnea index (AHI) was 84.4 per hour. There were 99 total apneas, including 91 obstructive, 7 central and 1 mixed apneas. There were 178 hypopneas and 47 RERAs. The AHI during Stage REM sleep was 110.8 per hour. AHI while supine was 102.9 per hour. The mean oxygen saturation was 91.3%. The minimum SpO2 during sleep was 84.0%. moderate snoring was noted during this study. CARDIAC DATA The 2 lead EKG demonstrated sinus rhythm. The mean heart rate was 69.0 beats per minute. Other EKG findings include: None. LEG MOVEMENT DATA The total PLMS were 0 with a resulting PLMS index of 0.0. Associated arousal with leg movement index was 0.0 . IMPRESSIONS - Severe obstructive sleep apnea occurred during this study (AHI = 84.4/h). Unfortunately split could not be performed due to not enough sleep time - No significant central sleep apnea occurred during this study (CAI = 2.1/h). - Mild oxygen desaturation was noted during this study (Min O2 = 84.0%). - The patient snored with moderate snoring volume. - No cardiac abnormalities were noted during this study. - Clinically significant periodic limb movements did not occur during sleep. No significant associated arousals. DIAGNOSIS - Obstructive Sleep Apnea (G47.33)  RECOMMENDATIONS - Therapeutic CPAP titration to determine optimal pressure required to alleviate sleep disordered breathing. Alternatively autoCPAP can be used with med FF mask based on his earlier titration study - Avoid alcohol, sedatives and

## 2023-08-31 ENCOUNTER — Other Ambulatory Visit: Payer: Self-pay | Admitting: Family Medicine

## 2023-08-31 DIAGNOSIS — E876 Hypokalemia: Secondary | ICD-10-CM

## 2023-09-11 ENCOUNTER — Other Ambulatory Visit: Payer: Self-pay | Admitting: Family Medicine

## 2023-09-15 ENCOUNTER — Ambulatory Visit (INDEPENDENT_AMBULATORY_CARE_PROVIDER_SITE_OTHER): Payer: BC Managed Care – PPO | Admitting: Physician Assistant

## 2023-09-15 ENCOUNTER — Encounter (INDEPENDENT_AMBULATORY_CARE_PROVIDER_SITE_OTHER): Payer: Self-pay | Admitting: Physician Assistant

## 2023-09-15 VITALS — BP 146/92 | HR 95 | Temp 98.4°F | Ht 75.0 in | Wt 357.0 lb

## 2023-09-15 DIAGNOSIS — E1159 Type 2 diabetes mellitus with other circulatory complications: Secondary | ICD-10-CM

## 2023-09-15 DIAGNOSIS — E559 Vitamin D deficiency, unspecified: Secondary | ICD-10-CM | POA: Diagnosis not present

## 2023-09-15 DIAGNOSIS — E785 Hyperlipidemia, unspecified: Secondary | ICD-10-CM | POA: Diagnosis not present

## 2023-09-15 DIAGNOSIS — E1169 Type 2 diabetes mellitus with other specified complication: Secondary | ICD-10-CM | POA: Diagnosis not present

## 2023-09-15 DIAGNOSIS — E538 Deficiency of other specified B group vitamins: Secondary | ICD-10-CM

## 2023-09-15 DIAGNOSIS — I152 Hypertension secondary to endocrine disorders: Secondary | ICD-10-CM

## 2023-09-15 DIAGNOSIS — E1165 Type 2 diabetes mellitus with hyperglycemia: Secondary | ICD-10-CM | POA: Diagnosis not present

## 2023-09-15 DIAGNOSIS — Z794 Long term (current) use of insulin: Secondary | ICD-10-CM | POA: Diagnosis not present

## 2023-09-15 DIAGNOSIS — Z7985 Long-term (current) use of injectable non-insulin antidiabetic drugs: Secondary | ICD-10-CM

## 2023-09-15 DIAGNOSIS — G4733 Obstructive sleep apnea (adult) (pediatric): Secondary | ICD-10-CM | POA: Diagnosis not present

## 2023-09-15 DIAGNOSIS — Z6841 Body Mass Index (BMI) 40.0 and over, adult: Secondary | ICD-10-CM

## 2023-09-15 MED ORDER — TIRZEPATIDE 10 MG/0.5ML ~~LOC~~ SOAJ
10.0000 mg | SUBCUTANEOUS | 0 refills | Status: DC
Start: 2023-09-15 — End: 2023-10-15

## 2023-09-15 MED ORDER — CARVEDILOL 12.5 MG PO TABS: 12.5000 mg | ORAL_TABLET | Freq: Two times a day (BID) | ORAL | 3 refills | Status: DC

## 2023-09-15 MED ORDER — VITAMIN D (ERGOCALCIFEROL) 1.25 MG (50000 UNIT) PO CAPS: 50000.0000 [IU] | ORAL_CAPSULE | ORAL | 0 refills | Status: DC

## 2023-09-15 NOTE — Progress Notes (Signed)
SUBJECTIVE:  Chief Complaint: Obesity  Interim History: Luis [name] presents with a history of type 2 diabetes, hypertension, hypercholesterolemia, chronic heart failure, and vitamin D and B12 deficiency, is seen for a follow-up of his obesity treatment plan.   He has been adhering to his diet meal plan, with occasional deviations for pizza and [place] fries. He has replaced unhealthy snacks with Roselyn Reef apples and has eliminated sodas from his diet.  He has also been using a peddler for exercise.  Total weight loss of 26 lbs over the past year. Started back on [facility] March 2024  TBW loss of 6.8% Fasting labs were obtained today.  He was informed we would discuss his lab results at his next visit unless there is a critical issue that needs to be addressed sooner. He agreed to keep his next visit at the agreed upon time to discuss these results.    Luis [name] is here to discuss his progress with his obesity treatment plan. He is on the Category 4 Plan and states he is following his eating plan approximately 90 % of the time. He states he is exercising using a peddler 10-15 minutes 5-6 times per week.   OBJECTIVE: Visit Diagnoses: Problem List Items Addressed This Visit     Morbid obesity (HCC)   Relevant Medications   tirzepatide (MOUNJARO) 10 MG/0.5ML Pen   Vitamin D deficiency   Relevant Medications   Vitamin D, Ergocalciferol, (DRISDOL) 1.25 MG (50000 UNIT) CAPS capsule (Start on 09/17/2023)   Other Relevant Orders   VITAMIN D 25 Hydroxy (Vit-D Deficiency, Fractures)   B12 deficiency   Relevant Orders   Vitamin B12   CBC with Differential/Platelet   Hypertension associated with diabetes (HCC) (Chronic)   Relevant Medications   carvedilol (COREG) 12.5 MG tablet   tirzepatide (MOUNJARO) 10 MG/0.5ML Pen   Hyperlipidemia associated with type 2 diabetes mellitus (HCC) (Chronic)   Relevant Medications   carvedilol (COREG) 12.5 MG tablet   tirzepatide (MOUNJARO) 10 MG/0.5ML  Pen   Other Relevant Orders   Lipid Panel With LDL/HDL Ratio   Diabetes (HCC) - Primary (Chronic)   Relevant Medications   tirzepatide (MOUNJARO) 10 MG/0.5ML Pen   Other Relevant Orders   CMP14+EGFR   Hemoglobin A1c   Other Visit Diagnoses     BMI 40.0-44.9, adult (HCC) Current  BMI 44.6       Relevant Medications   tirzepatide (MOUNJARO) 10 MG/0.5ML Pen     Obesity and Type 2 Diabetes Noted weight loss and improved dietary habits. Blood glucose levels consistently below 120, rarely requiring insulin. Currently on Mounjaro and Novolin 70/30 insulin (rarely used). -Increase/Refill Mounjaro to 10mg  weekly  -Continue monitoring blood glucose levels twice daily, only administer insulin if levels are above 120. -Repeat A1C and CMET today to assess overall blood glucose control.  Hypertension Elevated blood pressure noted today, possibly related to recent cigarette smoking. -Continue current antihypertensive regimen.with Coreg 12.5 mg BID and torsemide 10 mg BID for fluid management Continue to work on nutrition plan to promote weight loss and improve BP control.  Recheck CMET today Refil Coreg 12.5 mg BID -Encourage smoking cessation for overall health improvement.  Hyperlipidemia LDL is not at goal of < 70/ HDL not at goal of > 40. Trig nearing goal.  Medication(s): zocor 20 mg daily. Reports no side effects with Zocor Cardiovascular risk factors: advanced age (older than 66 for men, 47 for women), diabetes mellitus, dyslipidemia, hypertension, male gender, obesity (BMI >= 30 kg/m2), sedentary  lifestyle, and smoking/ tobacco exposure  Lab Results  Component Value Date   CHOL 133 03/16/2023   HDL 33 (L) 03/16/2023   LDLCALC 77 03/16/2023   TRIG 127 03/16/2023   CHOLHDL 4.5 07/31/2022   CHOLHDL 4 10/03/2021   CHOLHDL 4.1 08/23/2020   Lab Results  Component Value Date   ALT 10 06/16/2023   AST 11 06/16/2023   ALKPHOS 123 (H) 06/16/2023   BILITOT 0.6 06/16/2023   The  10-year ASCVD risk score (Arnett DK, et al., 2019) is: 39.1%   Values used to calculate the score:     Age: 57 years     Sex: Male     Is Non-Hispanic African American: Yes     Diabetic: Yes     Tobacco smoker: Yes     Systolic Blood Pressure: 146 mmHg     Is BP treated: Yes     HDL Cholesterol: 33 mg/dL     Total Cholesterol: 133 mg/dL  Plan: Continue statin. Continue to work on nutrition plan -decreasing simple carbohydrates, increasing lean proteins, decreasing saturated fats and cholesterol , avoiding trans fats and exercise as able to promote weight loss, improve lipids and decrease cardiovascular risks. Continue Mounjaro to reduce CV risks. Continue to encourage smoking cessation.  Recheck fasting lipid panel today.   Vitamin D Deficiency Vitamin D is not at goal of 50.  Most recent vitamin D level was 21.8. He is on  prescription ergocalciferol 50,000 IU twice weekly.No N/V or muscle weakness with Ergocalciferol.   Lab Results  Component Value Date   VD25OH 21.8 (L) 06/16/2023   VD25OH 10.5 (L) 03/16/2023   VD25OH 9.4 (L) 07/31/2022    Plan: Continue and refill  prescription ergocalciferol 50,000 IU twice weekly Recheck vitamin D level today.  Low vitamin D levels can be associated with adiposity and may result in leptin resistance and weight gain. Also associated with fatigue. Currently on vitamin D supplementation without any adverse effects.   B 12 deficiency: On B 12 supplement . No side effects. Reports increased overall energy level.  Plan; Recheck B 12 level today and adjust supplementation accordingly.     General Health Maintenance -Continue with current exercise regimen (peddler). -Encourage continuation of improved dietary habits, especially during holiday season. -Plan to follow up with primary care provider in December.  Vitals Temp: 98.4 F (36.9 C) BP: (!) 146/92 Pulse Rate: 95 SpO2: 97 %   Anthropometric Measurements Height: 6\' 3"  (1.905  m) Weight: (!) 357 lb (161.9 kg) BMI (Calculated): 44.62 Weight at Last Visit: 362 lb Weight Lost Since Last Visit: 5 lb Weight Gained Since Last Visit: 0 Starting Weight: 383 lb Total Weight Loss (lbs): 26 lb (11.8 kg) Peak Weight: 383 lb   Body Composition  Body Fat %: 44.7 % Fat Mass (lbs): 44.7 lbs Muscle Mass (lbs): 188 lbs Visceral Fat Rating : 30   Other Clinical Data Fasting: yes Labs: yes Today's Visit #: 14 Starting Date: 07/31/22     ASSESSMENT AND PLAN:  Diet: Jaguar is currently in the action stage of change. As such, his goal is to continue with weight loss efforts. He has agreed to Category 4 Plan.  Exercise: Rudolfo has been instructed to work up to a goal of 150 minutes of combined cardio and strengthening exercise per week for weight loss and overall health benefits.   Behavior Modification:  We discussed the following Behavioral Modification Strategies today: increasing lean protein intake, decreasing simple carbohydrates, increasing vegetables, increase  H2O intake, increase high fiber foods, no skipping meals, meal planning and cooking strategies, and holiday eating strategies. We discussed various medication options to help Sarthak with his weight loss efforts and we both agreed to increase Mounjaro to 10 mg weekly for management of Type 2 diabetes.  Return in about 4 weeks (around 10/13/2023).Marland Kitchen He was informed of the importance of frequent follow up visits to maximize his success with intensive lifestyle modifications for his multiple health conditions.  Attestation Statements:   Reviewed by clinician on day of visit: allergies, medications, problem list, medical history, surgical history, family history, social history, and previous encounter notes.   Time spent on visit including pre-visit chart review and post-visit care and charting was 40 minutes.    Ridley Dileo, PA-C

## 2023-09-17 ENCOUNTER — Encounter (INDEPENDENT_AMBULATORY_CARE_PROVIDER_SITE_OTHER): Payer: Self-pay | Admitting: Physician Assistant

## 2023-09-17 LAB — CBC WITH DIFFERENTIAL/PLATELET
Basophils Absolute: 0 10*3/uL (ref 0.0–0.2)
Basos: 1 %
EOS (ABSOLUTE): 0.2 10*3/uL (ref 0.0–0.4)
Eos: 3 %
Hematocrit: 47.6 % (ref 37.5–51.0)
Hemoglobin: 15.6 g/dL (ref 13.0–17.7)
Immature Grans (Abs): 0 10*3/uL (ref 0.0–0.1)
Immature Granulocytes: 0 %
Lymphocytes Absolute: 1.8 10*3/uL (ref 0.7–3.1)
Lymphs: 33 %
MCH: 32 pg (ref 26.6–33.0)
MCHC: 32.8 g/dL (ref 31.5–35.7)
MCV: 98 fL — ABNORMAL HIGH (ref 79–97)
Monocytes Absolute: 0.5 10*3/uL (ref 0.1–0.9)
Monocytes: 10 %
Neutrophils Absolute: 2.9 10*3/uL (ref 1.4–7.0)
Neutrophils: 53 %
Platelets: 255 10*3/uL (ref 150–450)
RBC: 4.88 x10E6/uL (ref 4.14–5.80)
RDW: 13.2 % (ref 11.6–15.4)
WBC: 5.4 10*3/uL (ref 3.4–10.8)

## 2023-09-17 LAB — VITAMIN D 25 HYDROXY (VIT D DEFICIENCY, FRACTURES): Vit D, 25-Hydroxy: 41 ng/mL (ref 30.0–100.0)

## 2023-09-17 LAB — LIPID PANEL WITH LDL/HDL RATIO
Cholesterol, Total: 107 mg/dL (ref 100–199)
HDL: 39 mg/dL — ABNORMAL LOW (ref >39)
LDL Chol Calc (NIH): 44 mg/dL (ref 0–99)
LDL/HDL Ratio: 1.1 ratio (ref 0.0–3.6)
Triglycerides: 135 mg/dL (ref 0–149)
VLDL Cholesterol Cal: 24 mg/dL (ref 5–40)

## 2023-09-17 LAB — CMP14+EGFR
ALT: 19 [IU]/L (ref 0–44)
AST: 19 IU/L (ref 0–40)
Albumin: 4.1 g/dL (ref 3.8–4.9)
Alkaline Phosphatase: 141 IU/L — ABNORMAL HIGH (ref 44–121)
BUN/Creatinine Ratio: 12 (ref 9–20)
BUN: 13 mg/dL (ref 6–24)
Bilirubin Total: 0.7 mg/dL (ref 0.0–1.2)
CO2: 22 mmol/L (ref 20–29)
Calcium: 8.8 mg/dL (ref 8.7–10.2)
Chloride: 104 mmol/L (ref 96–106)
Creatinine, Ser: 1.09 mg/dL (ref 0.76–1.27)
Globulin, Total: 3 g/dL (ref 1.5–4.5)
Glucose: 108 mg/dL — ABNORMAL HIGH (ref 70–99)
Potassium: 3.3 mmol/L — ABNORMAL LOW (ref 3.5–5.2)
Sodium: 144 mmol/L (ref 134–144)
Total Protein: 7.1 g/dL (ref 6.0–8.5)
eGFR: 80 mL/min/{1.73_m2} (ref >59)

## 2023-09-17 LAB — HEMOGLOBIN A1C
Est. average glucose Bld gHb Est-mCnc: 137 mg/dL
Hgb A1c MFr Bld: 6.4 % — ABNORMAL HIGH (ref 4.8–5.6)

## 2023-09-17 LAB — VITAMIN B12: Vitamin B-12: 324 pg/mL (ref 232–1245)

## 2023-09-17 NOTE — Telephone Encounter (Signed)
Called patient to give message, but voicemail was full.  Unable to leave a message.

## 2023-09-21 NOTE — Telephone Encounter (Signed)
Shawn, please advise.

## 2023-09-21 NOTE — Telephone Encounter (Signed)
Call to patient and left message to check my chart message or call us back.

## 2023-09-25 ENCOUNTER — Ambulatory Visit (INDEPENDENT_AMBULATORY_CARE_PROVIDER_SITE_OTHER): Payer: BC Managed Care – PPO | Admitting: Podiatry

## 2023-09-25 ENCOUNTER — Encounter: Payer: Self-pay | Admitting: Podiatry

## 2023-09-25 DIAGNOSIS — E1165 Type 2 diabetes mellitus with hyperglycemia: Secondary | ICD-10-CM | POA: Diagnosis not present

## 2023-09-25 DIAGNOSIS — B351 Tinea unguium: Secondary | ICD-10-CM | POA: Diagnosis not present

## 2023-09-25 DIAGNOSIS — Z794 Long term (current) use of insulin: Secondary | ICD-10-CM | POA: Diagnosis not present

## 2023-09-25 DIAGNOSIS — M79675 Pain in left toe(s): Secondary | ICD-10-CM

## 2023-09-25 DIAGNOSIS — M79674 Pain in right toe(s): Secondary | ICD-10-CM

## 2023-09-25 NOTE — Progress Notes (Signed)
This patient returns to my office for at risk foot care.  This patient requires this care by a professional since this patient will be at risk due to having diabetes and venous stasis.  This patient is unable to cut nails himself since the patient cannot reach his nails.These nails are painful walking and wearing shoes.  This patient presents for at risk foot care today. ? ?General Appearance  Alert, conversant and in no acute stress. ? ?Vascular  Dorsalis pedis and posterior tibial  pulses are not palpable  bilaterally due to severe swelling..  Capillary return is within normal limits  bilaterally. Temperature is within normal limits  bilaterally. ? ?Neurologic  Senn-Weinstein monofilament wire test within normal limits  bilaterally. Muscle power within normal limits bilaterally. ? ?Nails Thick disfigured discolored nails with subungual debris  from hallux to fifth toes bilaterally. No evidence of bacterial infection or drainage bilaterally. ? ?Orthopedic  No limitations of motion  feet .  No crepitus or effusions noted.  No bony pathology or digital deformities noted. ? ?Skin  normotropic skin with no porokeratosis noted bilaterally.  No signs of infections or ulcers noted.    ? ?Onychomycosis  Pain in right toes  Pain in left toes  Swelling legs/feet ? ?Consent was obtained for treatment procedures.   Mechanical debridement of nails 1-5  bilaterally performed with a nail nipper.  Filed with dremel without incident.  ? ? ?Return office visit   3 months                   Told patient to return for periodic foot care and evaluation due to potential at risk complications. ? ? ?Helane Gunther DPM  ?

## 2023-10-15 ENCOUNTER — Ambulatory Visit (INDEPENDENT_AMBULATORY_CARE_PROVIDER_SITE_OTHER): Payer: BC Managed Care – PPO | Admitting: Physician Assistant

## 2023-10-15 ENCOUNTER — Encounter (INDEPENDENT_AMBULATORY_CARE_PROVIDER_SITE_OTHER): Payer: Self-pay | Admitting: Physician Assistant

## 2023-10-15 VITALS — BP 126/86 | HR 86 | Temp 97.9°F | Ht 75.0 in | Wt 352.0 lb

## 2023-10-15 DIAGNOSIS — E559 Vitamin D deficiency, unspecified: Secondary | ICD-10-CM

## 2023-10-15 DIAGNOSIS — E785 Hyperlipidemia, unspecified: Secondary | ICD-10-CM

## 2023-10-15 DIAGNOSIS — I5032 Chronic diastolic (congestive) heart failure: Secondary | ICD-10-CM

## 2023-10-15 DIAGNOSIS — E1169 Type 2 diabetes mellitus with other specified complication: Secondary | ICD-10-CM | POA: Diagnosis not present

## 2023-10-15 DIAGNOSIS — Z794 Long term (current) use of insulin: Secondary | ICD-10-CM

## 2023-10-15 DIAGNOSIS — E1165 Type 2 diabetes mellitus with hyperglycemia: Secondary | ICD-10-CM

## 2023-10-15 DIAGNOSIS — I152 Hypertension secondary to endocrine disorders: Secondary | ICD-10-CM

## 2023-10-15 DIAGNOSIS — G4733 Obstructive sleep apnea (adult) (pediatric): Secondary | ICD-10-CM | POA: Diagnosis not present

## 2023-10-15 DIAGNOSIS — Z6841 Body Mass Index (BMI) 40.0 and over, adult: Secondary | ICD-10-CM

## 2023-10-15 DIAGNOSIS — E1159 Type 2 diabetes mellitus with other circulatory complications: Secondary | ICD-10-CM

## 2023-10-15 DIAGNOSIS — Z7985 Long-term (current) use of injectable non-insulin antidiabetic drugs: Secondary | ICD-10-CM

## 2023-10-15 MED ORDER — VITAMIN D (ERGOCALCIFEROL) 1.25 MG (50000 UNIT) PO CAPS: 50000.0000 [IU] | ORAL_CAPSULE | ORAL | 0 refills | Status: DC

## 2023-10-15 MED ORDER — TIRZEPATIDE 10 MG/0.5ML ~~LOC~~ SOAJ: 10.0000 mg | SUBCUTANEOUS | 0 refills | Status: DC

## 2023-10-15 NOTE — Progress Notes (Signed)
SUBJECTIVE:  Chief Complaint: Obesity  Interim History: He down 5 lbs since last visit Down 31 lbs overall TBW loss of 8.1%  Mr. Luis [name], a 57 year old gentleman with a history of obesity, type 2 diabetes, hypertension, hyperlipidemia, vitamin D and B12 deficiencies, and chronic diastolic heart failure, presents for a follow-up visit regarding his obesity treatment plan. He reports adherence to a meal plan, which has resulted in a weight loss of 31 pounds. He also notes improved sleep quality and energy levels since starting CPAP therapy for sleep apnea, with a reduction in nocturia from six to seven times per night to sleeping through the night.  Despite these improvements, he expresses he has been considering discontinuing his medication, Mounjaro, due to financial constraints. However, he acknowledges the medication's role in suppressing his appetite and controlling his diabetes. He also reports adherence to his prescribed vitamin D supplement, but admits to missing a few doses.  In terms of physical activity, he reports limited exercise, primarily using a peddler once a week, but notes increased movement in his daily routine. He also mentions adherence to a protein-rich meal plan and occasional consumption of healthy snacks such as Roselyn Reef apples, peanut butter, and salt-free crackers.  Overall, he expresses satisfaction with his progress and a strong desire to continue improving his health. We discussed how his weight loss has improved his overall health status and feel that Greggory Keen has contributed significantly to this progress. He notes less SOB/DOE and does feels he is much improved overall.   [name] is here to discuss his progress with his obesity treatment plan. He is on the Category 4 Plan and states he is following his eating plan approximately 85 % of the time. He states he is exercising peddler 20 minutes 1 times per week.   OBJECTIVE: Visit Diagnoses: Problem  List Items Addressed This Visit     Chronic diastolic CHF (congestive heart failure) (HCC), ECHO 03/2017 EF 50-55%, on Torsemide   OSA on CPAP   Morbid obesity (HCC)   Relevant Medications   tirzepatide (MOUNJARO) 10 MG/0.5ML Pen   Vitamin D deficiency   Relevant Medications   Vitamin D, Ergocalciferol, (DRISDOL) 1.25 MG (50000 UNIT) CAPS capsule   BMI 40.0-44.9, adult (HCC)   Relevant Medications   tirzepatide (MOUNJARO) 10 MG/0.5ML Pen   Hypertension associated with diabetes (HCC) (Chronic)   Relevant Medications   tirzepatide (MOUNJARO) 10 MG/0.5ML Pen   Hyperlipidemia associated with type 2 diabetes mellitus (HCC) (Chronic)   Relevant Medications   tirzepatide (MOUNJARO) 10 MG/0.5ML Pen   Diabetes (HCC) - Primary (Chronic)   Relevant Medications   tirzepatide (MOUNJARO) 10 MG/0.5ML Pen  Obesity BMI reduced to 44 with a 31-pound weight loss. Adhering to meal plan and some physical activity. Considering discontinuing weight loss medication due to cost, but advised against it due to benefits in controlling type 2 diabetes and other comorbidities. - Continue current meal plan - Increase frequency of using the peddler - Continue weight loss medication at the current dose  Type 2 Diabetes Mellitus Lab Results  Component Value Date   HGBA1C 6.4 (H) 09/15/2023   HGBA1C 6.8 (H) 06/16/2023   HGBA1C 7.0 (H) 03/16/2023   Lab Results  Component Value Date   MICROALBUR 3.6 (H) 10/03/2021   LDLCALC 44 09/15/2023   CREATININE 1.09 09/15/2023    Well-controlled with an A1c of 6.4. Current regimen prevents complications such as dialysis and limb loss, and helps control hyperlipidemia and and may contribute to improvement  in heart failure as well. - Continue current diabetes medication regimen - Monitor blood glucose levels regularly    Hyperlipidemia The ASCVD Risk score (Arnett DK, et al., 2019) failed to calculate for the following reasons:   The valid total cholesterol range is  130 to 320 mg/dL On Zocor 20 mg daily.  Managed with current medication as part of cardiovascular risk management. - Continue current lipid-lowering medication  Chronic Diastolic Heart Failure Last Echo: 12/05/21:   1. Left ventricular ejection fraction, by estimation, is 60 to 65%. The  left ventricle has normal function. The left ventricle has no regional  wall motion abnormalities. There is moderate concentric left ventricular  hypertrophy. Left ventricular  diastolic parameters are consistent with Grade I diastolic dysfunction  (impaired relaxation).   2. Right ventricular systolic function was not well visualized. The right  ventricular size is moderately enlarged. Tricuspid regurgitation signal is  inadequate for assessing PA pressure.   3. The mitral valve is normal in structure. No evidence of mitral valve  regurgitation. No evidence of mitral stenosis.   4. The aortic valve is normal in structure. Aortic valve regurgitation is  not visualized. No aortic stenosis is present.   5. There is mild dilatation of the aortic root, measuring 38 mm. There is  borderline dilatation of the ascending aorta, measuring 37 mm.   6. The inferior vena cava is normal in size with greater than 50%  respiratory variability, suggesting right atrial pressure of 3 mmHg.   Plan; On ARB, Statin therapy, Demadex. Less L/E swelling overall with compression.  Continue usual medications . Appears well compensated currently  Feels breathing is even better now that he is using CPAP.    OSA on CPAP Improved symptoms with CPAP therapy, including better breathing and increased energy levels. - Continue using CPAP machine nightly - Monitor for any changes in symptoms  Vitamin D and B12 Deficiencies On supplementation for vitamin D and B12 deficiencies. - Refill vitamin D prescription - Continue vitamin B12 supplementation  General Health Maintenance Advised on healthy eating habits and physical  activity. Provided guidance on healthy snacks and encouraged to use a 100-calorie snack list. - Continue healthy eating habits - Use 100-calorie snack list for variety  Vitals Temp: 97.9 F (36.6 C) BP: 126/86 Pulse Rate: 86 SpO2: 97 %   Anthropometric Measurements Height: 6\' 3"  (1.905 m) Weight: (!) 352 lb (159.7 kg) BMI (Calculated): 44 Weight at Last Visit: 357 lb Weight Lost Since Last Visit: 5 lb Weight Gained Since Last Visit: 0 Starting Weight: 383 lb Total Weight Loss (lbs): 31 lb (14.1 kg) Peak Weight: 383 lb   Body Composition  Body Fat %: 41.8 % Fat Mass (lbs): 147.2 lbs Muscle Mass (lbs): 195 lbs Total Body Water (lbs): 164.8 lbs Visceral Fat Rating : 27   Other Clinical Data Fasting: yes Labs: no Today's Visit #: 15 Starting Date: 07/31/22     ASSESSMENT AND PLAN:  Diet: Holt is currently in the action stage of change. As such, his goal is to continue with weight loss efforts. He has agreed to Category 4 Plan.  Exercise: Primitivo has been instructed to try a geriatric exercise plan and that some exercise is better than none for weight loss and overall health benefits.   Behavior Modification:  We discussed the following Behavioral Modification Strategies today: increasing lean protein intake, decreasing simple carbohydrates, increasing vegetables, increase H2O intake, no skipping meals, better snacking choices, holiday eating strategies, and planning for success.  We discussed various medication options to help Addis with his weight loss efforts and we both agreed to continue Mounjaro 10 mg weekly for Type 2 diabetes management.  Return in about 4 weeks (around 11/12/2023).Marland Kitchen He was informed of the importance of frequent follow up visits to maximize his success with intensive lifestyle modifications for his multiple health conditions.  Attestation Statements:   Reviewed by clinician on day of visit: allergies, medications, problem list, medical  history, surgical history, family history, social history, and previous encounter notes.   Time spent on visit including pre-visit chart review and post-visit care and charting was 35 minutes.    Shacola Schussler, PA-C

## 2023-11-13 ENCOUNTER — Telehealth: Payer: BC Managed Care – PPO | Admitting: Adult Health

## 2023-11-13 ENCOUNTER — Encounter: Payer: Self-pay | Admitting: Adult Health

## 2023-11-13 DIAGNOSIS — G4733 Obstructive sleep apnea (adult) (pediatric): Secondary | ICD-10-CM

## 2023-11-13 NOTE — Patient Instructions (Addendum)
 Wear CPAP at bedtime goal is to wear all night long for at least 6 or more hours Change CPAP pressure 9 to 13cmH2O.  Work on healthy weight loss  Do not drive if sleepy  Follow up in 3 months and As needed

## 2023-11-13 NOTE — Progress Notes (Signed)
 Virtual Visit via Video Note  I connected with Luis [name] on 11/13/23 at 10:00 AM EST by a video enabled telemedicine application and verified that I am speaking with the correct person using two identifiers.  Location: Patient: Home  Provider: Office    I discussed the limitations of evaluation and management by telemedicine and the availability of in person appointments. The patient expressed understanding and agreed to proceed.  History of Present Illness: 58 year old male smoker seen for sleep consult May 12, 2023 to reestablish for sleep apnea Former patient of Dr. Jude  Today's video visit is a 35-month follow-up for severe sleep apnea.  Patient was seen in July to reestablish for sleep apnea.  He was set up for an in lab study that was done July 22, 2023 that showed severe sleep apnea.  Patient was seen last visit and started on CPAP therapy.  Since last visit patient is feeling better when he first started using the CPAP, definitely felt it was helping but over last week he is having trouble tolerating. Feels CPAP pressure is not strong enough at times.  Using a full face mask.  Works from home.  Discussed importance of CPAP compliance and potential for complications of untreated OSA.  CPAP download shows 63% compliance.  Daily average usage at 7.5 hours.  Patient is on auto CPAP 8 to 20 cm H2O.  Daily average pressure at 9.1 cm H2O.  AHI 0.4/hour.  Patient  Past Medical History:  Diagnosis Date   Chronic HFimpEF (heart failure with improved ejection fraction) (HCC)    a. 03/2016 Echo: EF 35-40%, diff HK, GrII DD; b. 03/2017 Echo: EF 50-55%; c. 12/2021 Echo: EF 60-65%, no rwma, GrI DD, mod enlarged RV w/ nl fxn, Ao root 38mm, Asc Ao 37mm.   Difficult intubation 12/06/2020   DM (diabetes mellitus) (HCC)    Edema    both ankles and feet   Essential hypertension    History of tobacco abuse    Hyperlipidemia    Morbid obesity (HCC)    NICM (nonischemic cardiomyopathy) (HCC)     a. 03/2016 Echo: EF 35-40%, diff HK, GrII DD; b. 03/2017 Echo: EF 50-55%; c. 12/2021 Echo: EF 60-65%, no rwma, GrI DD.   Non-obstructive CAD (coronary artery disease)    a. 03/2016 Cath: LM nl, LAD 35p, LCX nl, RCA nl. EF 35-45%.   Noncompliance    OSA (obstructive sleep apnea)    Pneumonia 2017   Premature atrial contractions    Shortness of breath    Sleep apnea    Current Outpatient Medications on File Prior to Visit  Medication Sig Dispense Refill   albuterol  (VENTOLIN  HFA) 108 (90 Base) MCG/ACT inhaler Inhale 1-2 puffs into the lungs every 6 (six) hours as needed for wheezing or shortness of breath. 1 each 0   aspirin  EC 81 MG EC tablet Take 1 tablet (81 mg total) by mouth daily. 30 tablet 0   carvedilol  (COREG ) 12.5 MG tablet Take 1 tablet (12.5 mg total) by mouth 2 (two) times daily with a meal. 60 tablet 3   glucose blood test strip Check glucose TID/ E11.9 100 each 12   Insulin  Pen Needle 31G X 8 MM MISC Use to inject insulin  twice daily 100 each 11   Insulin  Pen Needle 32G X 6 MM MISC Use to administer insulin  two times daily 90 each 11   OneTouch Delica Lancets 33G MISC Use TID/ E11.9 100 each 5   potassium chloride  SA (KLOR-CON   M) 20 MEQ tablet TAKE 1 TABLET(20 MEQ) BY MOUTH DAILY 30 tablet 2   simvastatin  (ZOCOR ) 20 MG tablet TAKE 1 TABLET(20 MG) BY MOUTH AT BEDTIME 90 tablet 1   tirzepatide  (MOUNJARO ) 10 MG/0.5ML Pen Inject 10 mg into the skin once a week. 2 mL 0   torsemide  (DEMADEX ) 10 MG tablet TAKE 1 TABLET(10 MG) BY MOUTH TWICE DAILY 60 tablet 1   Vitamin D , Ergocalciferol , (DRISDOL ) 1.25 MG (50000 UNIT) CAPS capsule Take 1 capsule (50,000 Units total) by mouth 2 (two) times a week. 8 capsule 0   No current facility-administered medications on file prior to visit.       Observations/Objective: Hospitalized 4/29-5/15/2018  for acute respiratory failure requiring mechanical ventilation due to influenza pneumonia. He also has nonischemic cardiomyopathy with EF of 35-40%  with a normal left heart cath in 03/2016 in chronic atrial fibrillation. He required mechanical ventilation from 4/29 -03/12/2017.    Home sleep study July 2017 showed mild sleep apnea with AHI of 14/hour   CPAP titration study on July 29, 2017 showed optimal control at 14 cm H2O.   echo in 2023 showed improved EF at 60 to 65%, grade 1 diastolic dysfunction. Moderate dilation of the RV.   Split night sleep study done on 07/22/23 showed severe obstructive sleep apnea with AHI at 84.4/hour, AHI in supine position was 102.9/hour and AHI during stage REM sleep was 110.8/hour, SpO2 low with 84%.  Unfortunately was not able to complete the CPAP titration portion of the study due to decreased sleep time.   11/13/2023 appears well in nad   Assessment and Plan: Severe obstructive sleep apnea.  Patient is encouraged on CPAP compliance.  Will change his CPAP pressure for comfort.  Changed to auto CPAP 9 to 13 cm H2O. - discussed how weight can impact sleep and risk for sleep disordered breathing - discussed options to assist with weight loss: combination of diet modification, cardiovascular and strength training exercises   - had an extensive discussion regarding the adverse health consequences related to untreated sleep disordered breathing - specifically discussed the risks for hypertension, coronary artery disease, cardiac dysrhythmias, cerebrovascular disease, and diabetes - lifestyle modification discussed   - discussed how sleep disruption can increase risk of accidents, particularly when driving - safe driving practices were discussed  Morbid obesity encouraged on healthy weight loss   Plan  . Patient Instructions  Wear CPAP at bedtime goal is to wear all night long for at least 6 or more hours Change CPAP pressure 9 to 13cmH2O.  Work on healthy weight loss  Do not drive if sleepy  Follow up in 3 months and As needed      Follow Up Instructions:    I discussed the assessment and  treatment plan with the patient. The patient was provided an opportunity to ask questions and all were answered. The patient agreed with the plan and demonstrated an understanding of the instructions.   The patient was advised to call back or seek an in-person evaluation if the symptoms worsen or if the condition fails to improve as anticipated.  I provided 22 minutes of non-face-to-face time during this encounter.   Madelin Stank, NP

## 2023-11-15 DIAGNOSIS — G4733 Obstructive sleep apnea (adult) (pediatric): Secondary | ICD-10-CM | POA: Diagnosis not present

## 2023-11-17 ENCOUNTER — Encounter (INDEPENDENT_AMBULATORY_CARE_PROVIDER_SITE_OTHER): Payer: Self-pay | Admitting: Internal Medicine

## 2023-11-17 ENCOUNTER — Other Ambulatory Visit: Payer: Self-pay | Admitting: Family Medicine

## 2023-11-17 ENCOUNTER — Ambulatory Visit (INDEPENDENT_AMBULATORY_CARE_PROVIDER_SITE_OTHER): Payer: BC Managed Care – PPO | Admitting: Internal Medicine

## 2023-11-17 VITALS — BP 152/89 | HR 90 | Temp 98.7°F | Ht 75.0 in | Wt 348.0 lb

## 2023-11-17 DIAGNOSIS — Z6841 Body Mass Index (BMI) 40.0 and over, adult: Secondary | ICD-10-CM

## 2023-11-17 DIAGNOSIS — E559 Vitamin D deficiency, unspecified: Secondary | ICD-10-CM | POA: Diagnosis not present

## 2023-11-17 DIAGNOSIS — E1169 Type 2 diabetes mellitus with other specified complication: Secondary | ICD-10-CM

## 2023-11-17 DIAGNOSIS — E1165 Type 2 diabetes mellitus with hyperglycemia: Secondary | ICD-10-CM

## 2023-11-17 DIAGNOSIS — I152 Hypertension secondary to endocrine disorders: Secondary | ICD-10-CM

## 2023-11-17 DIAGNOSIS — E1159 Type 2 diabetes mellitus with other circulatory complications: Secondary | ICD-10-CM | POA: Diagnosis not present

## 2023-11-17 DIAGNOSIS — Z7985 Long-term (current) use of injectable non-insulin antidiabetic drugs: Secondary | ICD-10-CM

## 2023-11-17 DIAGNOSIS — E669 Obesity, unspecified: Secondary | ICD-10-CM

## 2023-11-17 DIAGNOSIS — Z794 Long term (current) use of insulin: Secondary | ICD-10-CM

## 2023-11-17 DIAGNOSIS — I5032 Chronic diastolic (congestive) heart failure: Secondary | ICD-10-CM

## 2023-11-17 MED ORDER — VITAMIN D3 50 MCG (2000 UT) PO CAPS: 2000.0000 [IU] | ORAL_CAPSULE | Freq: Every day | ORAL | Status: AC

## 2023-11-17 MED ORDER — TIRZEPATIDE 10 MG/0.5ML ~~LOC~~ SOAJ
10.0000 mg | SUBCUTANEOUS | 0 refills | Status: DC
Start: 2023-11-17 — End: 2023-12-29

## 2023-11-17 MED ORDER — MULTI-VITAMIN/MINERALS PO TABS: 1.0000 | ORAL_TABLET | Freq: Every day | ORAL | Status: AC

## 2023-11-17 NOTE — Progress Notes (Signed)
 Marland Kitchen

## 2023-11-17 NOTE — Progress Notes (Signed)
 Office: [phone]  /  Fax: [phone]  Weight Summary And Biometrics  Vitals Temp: 98.7 F (37.1 C) BP: (!) 152/89 Pulse Rate: 90 SpO2: 96 %   Anthropometric Measurements Height: 6' 3 (1.905 m) Weight: (!) 348 lb (157.9 kg) BMI (Calculated): 43.5 Weight at Last Visit: 352 lb Weight Lost Since Last Visit: 4 lb Weight Gained Since Last Visit: 0 lb Starting Weight: 383 lb Total Weight Loss (lbs): 35 lb (15.9 kg) Peak Weight: 383 lb   Body Composition  Body Fat %: 42.8 % Fat Mass (lbs): 149.2 lbs Muscle Mass (lbs): 189.8 lbs Total Body Water (lbs): 164.4 lbs Visceral Fat Rating : 28    No data recorded Today's Visit #: 15  Starting Date: 07/31/22   Subjective   Chief Complaint: Obesity  Interval History:   Discussed the use of AI scribe software for clinical note transcription with the patient, who gave verbal consent to proceed.  History of Present Illness   The patient, with a history of diabetes and hypertension, presents for a medical weight management consultation. He reports a significant weight loss, which has led to a decrease in appetite to the point where he has to remind himself to eat. Despite this, he reports no significant physical activity, attributing this to what he describes as laziness. He has a pedal exerciser at home but admits to not using it regularly.  The patient is currently on Mounjaro  10mg  once a week for diabetes management, which he reports has been refilled twice. He reports his blood sugars have been well controlled, with readings ranging from 100 to 120. His last A1c was 6.4, down from 6.8, indicating well-controlled diabetes.  He also reports some issues with blood pressure management. His blood pressure readings have been inconsistent, with the most recent reading being 154/89. He was previously on a higher dose of carvedilol , which was reduced due to episodes of low blood pressure. However, he reports no symptoms of  lightheadedness or dizziness during these episodes.  The patient is also on high-dose Vitamin D , which he has been taking for an extended period. He reports having about 4-5 refills of this medication.  The patient's weight loss has been significant, and he has been advised to take a multivitamin to ensure he is getting all necessary nutrients. He reports that he enjoys protein shakes and is considering incorporating these into his diet. He also reports that his primary snack is green apples, sometimes with peanut butter.       Orexigenic Control:  Denies problems with appetite and hunger signals.  Denies problems with satiety and satiation.  Denies problems with eating patterns and portion control.  Denies abnormal cravings. Denies feeling deprived or restricted.   Barriers identified: low volume of physical activity at present , medical comorbidities, and skipping of meals due to appetite suppression .   Pharmacotherapy for weight loss: He is currently taking Monjauro with diabetes as the primary indication with adequate clinical response  and without side effects..   Assessment and Plan   Treatment Plan For Obesity:  Recommended Dietary Goals  Blessing is currently in the action stage of change. As such, his goal is to continue weight management plan. He has agreed to: continue current plan  Behavioral Intervention  We discussed the following Behavioral Modification Strategies today: continue to work on maintaining a reduced calorie state, getting the recommended amount of protein, incorporating whole foods, making healthy choices, staying well hydrated and practicing mindfulness when eating..  Additional resources  provided today: None  Recommended Physical Activity Goals  Liem has been advised to work up to 150 minutes of moderate intensity aerobic activity a week and strengthening exercises 2-3 times per week for cardiovascular health, weight loss maintenance and  preservation of muscle mass.   He has agreed to :  Think about enjoyable ways to increase daily physical activity and overcoming barriers to exercise and Increase physical activity in their day and reduce sedentary time (increase NEAT).  Pharmacotherapy  We discussed various medication options to help Jodeci with his weight loss efforts and we both agreed to : continue current anti-obesity medication regimen and do not recommend further increases in GLP-1 due to loss of muscle, inadequate caloric and protein intake.  Associated Conditions Addressed and Impacted by Obesity Treatment  Type 2 diabetes mellitus with hyperglycemia, with long-term current use of insulin  [facility]) Assessment & Plan: HgbA1c is at goal for age and comorbid conditions. Denies symptoms of hypoglycemia or hyperglycemia. On Mounjaro  10 mg with good adherence and experiencing some muscle loss.  Counseled on goals of care, monitoring for complications and importance of staying updated on immunizations and diabetes preventive measures. Continue with reduced calorie meal plan low on processed crabs and simple sugars. Ongoing weight loss will improve insulin  resistance and glycemic control  Lab Results  Component Value Date   HGBA1C 6.4 (H) 09/15/2023   HGBA1C 6.8 (H) 06/16/2023   HGBA1C 7.0 (H) 03/16/2023   Lab Results  Component Value Date   MICROALBUR 3.6 (H) 10/03/2021   LDLCALC 44 09/15/2023   CREATININE 1.09 09/15/2023   Continue Mounjaro  at current dose with consideration to reduce if inadequate caloric and protein intake ensues.   Orders: -     Tirzepatide ; Inject 10 mg into the skin once a week.  Dispense: 2 mL; Refill: 0  Obesity (HCC)- Start BMI 47.87 Assessment & Plan: Patient has lost 35 pounds or approximately 10% of total body weight.  With muscle loss of more than 20%.  He is currently on GLP-1 therapy and has significant appetite suppression which may be resulting in skipping of meals and in adequate  caloric and protein intake.  Patient is also sedentary and does not engage in strengthening activity.   We discussed the importance of maintaining adequate caloric and protein intake with a goal of 40 g of protein per meal. We reviewed the importance of strengthening exercises, and he will look at ways to overcome existing barriers. He will start taking a multivitamin with minerals If he continues to have trouble obtaining adequate nutrition I recommend that his GLP medication be reduced. He will follow-up in our office in 4 weeks   Hypertension associated with diabetes [facility]) Assessment & Plan: Repeat blood pressure was again elevated.  His carvedilol  have been decreased.  I advised patient to increase to 25 mg twice a day he will also begin to monitor his blood pressure in the morning and also before bedtime to look at trends and for further intensification of therapy.   Vitamin D  deficiency Assessment & Plan: Most recent vitamin D  levels  Lab Results  Component Value Date   VD25OH 41.0 09/15/2023   VD25OH 21.8 (L) 06/16/2023   VD25OH 10.5 (L) 03/16/2023     Deficiency state associated with adiposity and may result in leptin resistance, weight gain and fatigue. Currently on vitamin D  supplementation without any adverse effects.  Plan: Recommend transitioning to over-the-counter supplementation.  He will take vitamin D3 2000 international units daily in addition to  a daily multivitamin with minerals.  This would get him around 3000 international units daily.    Other orders -     Vitamin D3; Take 1 capsule (2,000 Units total) by mouth daily. -     Multi-Vitamin/Minerals; Take 1 tablet by mouth daily.     Objective   Physical Exam:  Blood pressure (!) 152/89, pulse 90, temperature 98.7 F (37.1 C), height 6' 3 (1.905 m), weight (!) 348 lb (157.9 kg), SpO2 96%. Body mass index is 43.5 kg/m.  General: He is overweight, cooperative, alert, well developed, and in no acute  distress. PSYCH: Has normal mood, affect and thought process.   HEENT: EOMI, sclerae are anicteric. Lungs: Normal breathing effort, no conversational dyspnea. Extremities: No edema.  Neurologic: No gross sensory or motor deficits. No tremors or fasciculations noted.    Diagnostic Data Reviewed:  BMET    Component Value Date/Time   NA 144 09/15/2023 1307   K 3.3 (L) 09/15/2023 1307   CL 104 09/15/2023 1307   CO2 22 09/15/2023 1307   GLUCOSE 108 (H) 09/15/2023 1307   GLUCOSE 136 (H) 05/13/2023 0635   BUN 13 09/15/2023 1307   CREATININE 1.09 09/15/2023 1307   CREATININE 1.25 08/23/2020 1002   CALCIUM  8.8 09/15/2023 1307   GFRNONAA 58 (L) 05/13/2023 0635   GFRAA >60 10/15/2019 1513   Lab Results  Component Value Date   HGBA1C 6.4 (H) 09/15/2023   HGBA1C 7.8 (H) 03/20/2016   Lab Results  Component Value Date   INSULIN  18.4 03/16/2023   INSULIN  36.2 (H) 07/31/2022   Lab Results  Component Value Date   TSH 1.560 07/31/2022   CBC    Component Value Date/Time   WBC 5.4 09/15/2023 1307   WBC 7.3 05/13/2023 0635   RBC 4.88 09/15/2023 1307   RBC 4.82 05/13/2023 0635   HGB 15.6 09/15/2023 1307   HCT 47.6 09/15/2023 1307   PLT 255 09/15/2023 1307   MCV 98 (H) 09/15/2023 1307   MCH 32.0 09/15/2023 1307   MCH 32.0 05/13/2023 0635   MCHC 32.8 09/15/2023 1307   MCHC 32.9 05/13/2023 0635   RDW 13.2 09/15/2023 1307   Iron Studies No results found for: IRON, TIBC, FERRITIN, IRONPCTSAT Lipid Panel     Component Value Date/Time   CHOL 107 09/15/2023 1307   TRIG 135 09/15/2023 1307   HDL 39 (L) 09/15/2023 1307   CHOLHDL 4.5 07/31/2022 1029   CHOLHDL 4 10/03/2021 1444   VLDL 16.8 10/03/2021 1444   LDLCALC 44 09/15/2023 1307   LDLCALC 96 08/23/2020 1002   Hepatic Function Panel     Component Value Date/Time   PROT 7.1 09/15/2023 1307   ALBUMIN 4.1 09/15/2023 1307   AST 19 09/15/2023 1307   ALT 19 09/15/2023 1307   ALKPHOS 141 (H) 09/15/2023 1307   BILITOT  0.7 09/15/2023 1307   BILIDIR 0.2 12/20/2017 1356   IBILI 1.0 (H) 12/20/2017 1356      Component Value Date/Time   TSH 1.560 07/31/2022 1029   Nutritional Lab Results  Component Value Date   VD25OH 41.0 09/15/2023   VD25OH 21.8 (L) 06/16/2023   VD25OH 10.5 (L) 03/16/2023    Follow-Up   Return in about 4 weeks (around 12/15/2023) for Luis [name].SABRA He was informed of the importance of frequent follow up visits to maximize his success with intensive lifestyle modifications for his multiple health conditions.  Attestation Statement   Reviewed by clinician on day of visit: allergies, medications, problem list,  medical history, surgical history, family history, social history, and previous encounter notes.     Lucas Parker, MD

## 2023-11-17 NOTE — Progress Notes (Deleted)
 Office: [phone]  /  Fax: [phone]  Weight Summary And Biometrics  No data recorded No data recorded No data recorded  No data recorded No data recorded No data recorded  Subjective   Chief Complaint: Obesity  Luis [name] is here to discuss his progress with his obesity treatment plan. He is on the the Category 4 Plan and states he is following his eating plan approximately 95 % of the time. He states he is not exercising.  Interval History:   Since last office visit he has {emweight change:30888}. He reports {EMADHERENCE:28838::good adherence to reduced calorie nutritional plan.} He has been working on [employer] labels,not skipping meals,increasing protein intake at every meal,drinking more water,making healthier choices,reducing portion sizes,incorporating more whole foods}   Sleeping approximately {Numbers; 1-10:17898} hours a day.  Sleep described as {EMSLEEPREFR:28840}.   Stress levels are reported as {EMSTRESS:28843::low and manageable}.   Orexigenic Control:  {Actions; denies-reports:32002} problems with appetite and hunger signals.  {Actions; denies-reports:32002} problems with satiety and satiation.  {Actions; denies-reports:32002} problems with eating patterns and portion control.  {Actions; denies-reports:32002} abnormal cravings. {Actions; denies-reports:32002} feeling deprived or restricted.   Barriers identified: {EMOBESITYBARRIERS:28841}.   Pharmacotherapy for weight loss: He is currently taking {EMPharmaco:28845}.   Assessment and Plan   Treatment Plan For Obesity:  Recommended Dietary Goals  Glover is currently in the action stage of change. As such, his goal is to continue weight management plan. He has agreed to: {EMWTLOSSPLAN:29297::continue current plan}  Behavioral Intervention  We discussed the following Behavioral Modification Strategies today: {EMWMwtlossstrategies:28914::continue to work  on maintaining a reduced calorie state, getting the recommended amount of protein, incorporating whole foods, making healthy choices, staying well hydrated and practicing mindfulness when eating.}.  Additional resources provided today: {EMadditionalresources:29169::None}  Recommended Physical Activity Goals  Lenix has been advised to work up to 150 minutes of moderate intensity aerobic activity a week and strengthening exercises 2-3 times per week for cardiovascular health, weight loss maintenance and preservation of muscle mass.   He has agreed to :  {EMEXERCISE:28847::Think about enjoyable ways to increase daily physical activity and overcoming barriers to exercise,Increase physical activity in their day and reduce sedentary time (increase NEAT).}  Pharmacotherapy  We discussed various medication options to help Jansen with his weight loss efforts and we both agreed to : {EMagreedrx:29170}  Associated Conditions Addressed and Impacted by Obesity Treatment  Type 2 diabetes mellitus with hyperglycemia, with long-term current use of insulin  (HCC)  Hyperlipidemia associated with type 2 diabetes mellitus (HCC)  Chronic diastolic CHF (congestive heart failure) (HCC), ECHO 03/2017 EF 50-55%, on Torsemide   Obesity (HCC)- Start BMI 47.87  Hypertension associated with diabetes (HCC)  Vitamin D  deficiency     Objective   Physical Exam:  There were no vitals taken for this visit. There is no height or weight on file to calculate BMI.  General: He is overweight, cooperative, alert, well developed, and in no acute distress. PSYCH: Has normal mood, affect and thought process.   HEENT: EOMI, sclerae are anicteric. Lungs: Normal breathing effort, no conversational dyspnea. Extremities: No edema.  Neurologic: No gross sensory or motor deficits. No tremors or fasciculations noted.    Diagnostic Data Reviewed:  BMET    Component Value Date/Time   NA 144 09/15/2023 1307   K  3.3 (L) 09/15/2023 1307   CL 104 09/15/2023 1307   CO2 22 09/15/2023 1307   GLUCOSE 108 (H) 09/15/2023 1307   GLUCOSE 136 (H) 05/13/2023 0635   BUN 13 09/15/2023 1307  CREATININE 1.09 09/15/2023 1307   CREATININE 1.25 08/23/2020 1002   CALCIUM  8.8 09/15/2023 1307   GFRNONAA 58 (L) 05/13/2023 0635   GFRAA >60 10/15/2019 1513   Lab Results  Component Value Date   HGBA1C 6.4 (H) 09/15/2023   HGBA1C 7.8 (H) 03/20/2016   Lab Results  Component Value Date   INSULIN  18.4 03/16/2023   INSULIN  36.2 (H) 07/31/2022   Lab Results  Component Value Date   TSH 1.560 07/31/2022   CBC    Component Value Date/Time   WBC 5.4 09/15/2023 1307   WBC 7.3 05/13/2023 0635   RBC 4.88 09/15/2023 1307   RBC 4.82 05/13/2023 0635   HGB 15.6 09/15/2023 1307   HCT 47.6 09/15/2023 1307   PLT 255 09/15/2023 1307   MCV 98 (H) 09/15/2023 1307   MCH 32.0 09/15/2023 1307   MCH 32.0 05/13/2023 0635   MCHC 32.8 09/15/2023 1307   MCHC 32.9 05/13/2023 0635   RDW 13.2 09/15/2023 1307   Iron Studies No results found for: IRON, TIBC, FERRITIN, IRONPCTSAT Lipid Panel     Component Value Date/Time   CHOL 107 09/15/2023 1307   TRIG 135 09/15/2023 1307   HDL 39 (L) 09/15/2023 1307   CHOLHDL 4.5 07/31/2022 1029   CHOLHDL 4 10/03/2021 1444   VLDL 16.8 10/03/2021 1444   LDLCALC 44 09/15/2023 1307   LDLCALC 96 08/23/2020 1002   Hepatic Function Panel     Component Value Date/Time   PROT 7.1 09/15/2023 1307   ALBUMIN 4.1 09/15/2023 1307   AST 19 09/15/2023 1307   ALT 19 09/15/2023 1307   ALKPHOS 141 (H) 09/15/2023 1307   BILITOT 0.7 09/15/2023 1307   BILIDIR 0.2 12/20/2017 1356   IBILI 1.0 (H) 12/20/2017 1356      Component Value Date/Time   TSH 1.560 07/31/2022 1029   Nutritional Lab Results  Component Value Date   VD25OH 41.0 09/15/2023   VD25OH 21.8 (L) 06/16/2023   VD25OH 10.5 (L) 03/16/2023    Follow-Up   No follow-ups on file.SABRA He was informed of the importance of frequent  follow up visits to maximize his success with intensive lifestyle modifications for his multiple health conditions.  Attestation Statement   Reviewed by clinician on day of visit: allergies, medications, problem list, medical history, surgical history, family history, social history, and previous encounter notes.     Lucas Parker, MD

## 2023-11-18 NOTE — Assessment & Plan Note (Signed)
 Repeat blood pressure was again elevated.  His carvedilol  have been decreased.  I advised patient to increase to 25 mg twice a day he will also begin to monitor his blood pressure in the morning and also before bedtime to look at trends and for further intensification of therapy.

## 2023-11-18 NOTE — Assessment & Plan Note (Signed)
 Patient has lost 35 pounds or approximately 10% of total body weight.  With muscle loss of more than 20%.  He is currently on GLP-1 therapy and has significant appetite suppression which may be resulting in skipping of meals and in adequate caloric and protein intake.  Patient is also sedentary and does not engage in strengthening activity.   We discussed the importance of maintaining adequate caloric and protein intake with a goal of 40 g of protein per meal. We reviewed the importance of strengthening exercises, and he will look at ways to overcome existing barriers. He will start taking a multivitamin with minerals If he continues to have trouble obtaining adequate nutrition I recommend that his GLP medication be reduced. He will follow-up in our office in 4 weeks

## 2023-11-18 NOTE — Assessment & Plan Note (Signed)
 HgbA1c is at goal for age and comorbid conditions. Denies symptoms of hypoglycemia or hyperglycemia. On Mounjaro  10 mg with good adherence and experiencing some muscle loss.  Counseled on goals of care, monitoring for complications and importance of staying updated on immunizations and diabetes preventive measures. Continue with reduced calorie meal plan low on processed crabs and simple sugars. Ongoing weight loss will improve insulin  resistance and glycemic control  Lab Results  Component Value Date   HGBA1C 6.4 (H) 09/15/2023   HGBA1C 6.8 (H) 06/16/2023   HGBA1C 7.0 (H) 03/16/2023   Lab Results  Component Value Date   MICROALBUR 3.6 (H) 10/03/2021   LDLCALC 44 09/15/2023   CREATININE 1.09 09/15/2023   Continue Mounjaro  at current dose with consideration to reduce if inadequate caloric and protein intake ensues.

## 2023-11-18 NOTE — Assessment & Plan Note (Signed)
 Most recent vitamin D  levels  Lab Results  Component Value Date   VD25OH 41.0 09/15/2023   VD25OH 21.8 (L) 06/16/2023   VD25OH 10.5 (L) 03/16/2023     Deficiency state associated with adiposity and may result in leptin resistance, weight gain and fatigue. Currently on vitamin D  supplementation without any adverse effects.  Plan: Recommend transitioning to over-the-counter supplementation.  He will take vitamin D3 2000 international units daily in addition to a daily multivitamin with minerals.  This would get him around 3000 international units daily.

## 2023-12-16 DIAGNOSIS — G4733 Obstructive sleep apnea (adult) (pediatric): Secondary | ICD-10-CM | POA: Diagnosis not present

## 2023-12-21 ENCOUNTER — Other Ambulatory Visit: Payer: Self-pay | Admitting: Family Medicine

## 2023-12-21 DIAGNOSIS — E876 Hypokalemia: Secondary | ICD-10-CM

## 2023-12-22 ENCOUNTER — Other Ambulatory Visit (INDEPENDENT_AMBULATORY_CARE_PROVIDER_SITE_OTHER): Payer: Self-pay | Admitting: Internal Medicine

## 2023-12-22 ENCOUNTER — Other Ambulatory Visit: Payer: Self-pay | Admitting: Family Medicine

## 2023-12-22 DIAGNOSIS — E1165 Type 2 diabetes mellitus with hyperglycemia: Secondary | ICD-10-CM

## 2023-12-29 ENCOUNTER — Encounter: Payer: Self-pay | Admitting: Family Medicine

## 2023-12-29 ENCOUNTER — Encounter (INDEPENDENT_AMBULATORY_CARE_PROVIDER_SITE_OTHER): Payer: Self-pay | Admitting: Physician Assistant

## 2023-12-29 ENCOUNTER — Ambulatory Visit (INDEPENDENT_AMBULATORY_CARE_PROVIDER_SITE_OTHER): Payer: BC Managed Care – PPO | Admitting: Physician Assistant

## 2023-12-29 VITALS — BP 135/79 | HR 89 | Temp 98.6°F | Ht 75.0 in | Wt 343.0 lb

## 2023-12-29 DIAGNOSIS — I152 Hypertension secondary to endocrine disorders: Secondary | ICD-10-CM | POA: Diagnosis not present

## 2023-12-29 DIAGNOSIS — E1169 Type 2 diabetes mellitus with other specified complication: Secondary | ICD-10-CM

## 2023-12-29 DIAGNOSIS — Z794 Long term (current) use of insulin: Secondary | ICD-10-CM

## 2023-12-29 DIAGNOSIS — E785 Hyperlipidemia, unspecified: Secondary | ICD-10-CM

## 2023-12-29 DIAGNOSIS — Z6841 Body Mass Index (BMI) 40.0 and over, adult: Secondary | ICD-10-CM

## 2023-12-29 DIAGNOSIS — E1165 Type 2 diabetes mellitus with hyperglycemia: Secondary | ICD-10-CM | POA: Diagnosis not present

## 2023-12-29 DIAGNOSIS — E559 Vitamin D deficiency, unspecified: Secondary | ICD-10-CM

## 2023-12-29 DIAGNOSIS — Z7985 Long-term (current) use of injectable non-insulin antidiabetic drugs: Secondary | ICD-10-CM

## 2023-12-29 DIAGNOSIS — E1159 Type 2 diabetes mellitus with other circulatory complications: Secondary | ICD-10-CM | POA: Diagnosis not present

## 2023-12-29 MED ORDER — TIRZEPATIDE 10 MG/0.5ML ~~LOC~~ SOAJ: 10.0000 mg | SUBCUTANEOUS | 0 refills | Status: DC

## 2023-12-29 NOTE — Progress Notes (Signed)
 SUBJECTIVE: Discussed the use of AI scribe software for clinical note transcription with the patient, who gave verbal consent to proceed.  Chief Complaint: Obesity  Interim History: He is down 5 lbs since last visit.  Down 40 lbs overall.  TBW loss of 10.44% Muscle mass -10.4 lbs overall Adipose mass -20.8 lbs overall  Luis [name] is here to discuss his progress with his obesity treatment plan. He is on the Category 4 Plan and states he is following his eating plan approximately 35-50 % of the time. He states he is not exercising 0 minutes 0 times per week.  Luis Rumble "Jorja Loa" is a 58 year old male who presents for follow-up of his obesity treatment plan.  He has been on Mounjaro 10 mg weekly, which has led to a decrease in appetite and subsequent weight loss. He monitors his calorie intake, avoids snacks and soda, and opts for low-calorie foods like carrots, apples, and cantaloupe. He consumes protein-rich foods such as pork, pinto beans, and eggs to maintain muscle mass.   He has a history of type 2 diabetes and has not been able to check his blood sugar for about three months due to a non-functional glucose meter. He is considering a continuous glucose monitor and has been in contact with his insurance and primary care doctor regarding this issue.  He also has a history of chronic diastolic heart failure, obstructive sleep apnea managed with CPAP, hyperlipidemia, vitamin D deficiency, and hypertension.  His current medications include carvedilol 12.5 mg twice daily, potassium supplements 20 mEq daily, Ventolin inhaler as needed, Zocor 20 mg at bedtime, torsemide 10 mg up to twice daily, baby aspirin 81 mg daily, cholecalciferol 2000 units daily, and a multivitamin daily.  He smokes, which he is trying to minimize, and acknowledges its impact on his blood pressure. He has been trying to incorporate more physical activity into his routine, such as walking more and parking further away to  increase his daily steps.  OBJECTIVE: Visit Diagnoses: Problem List Items Addressed This Visit     Chronic diastolic CHF (congestive heart failure) (HCC), ECHO 03/2017 EF 50-55%, on Torsemide   OSA on CPAP   Morbid obesity (HCC)   Relevant Medications   tirzepatide (MOUNJARO) 10 MG/0.5ML Pen   Vitamin D deficiency   BMI 40.0-44.9, adult (HCC)   Relevant Medications   tirzepatide (MOUNJARO) 10 MG/0.5ML Pen   Hypertension associated with diabetes (HCC) (Chronic)   Relevant Medications   tirzepatide (MOUNJARO) 10 MG/0.5ML Pen   Hyperlipidemia associated with type 2 diabetes mellitus (HCC) (Chronic)   Relevant Medications   tirzepatide (MOUNJARO) 10 MG/0.5ML Pen   Diabetes (HCC) - Primary (Chronic)   Relevant Medications   tirzepatide (MOUNJARO) 10 MG/0.5ML Pen  [facility] [place] Luis [name], a 58 year old male with type 2 diabetes, chronic diastolic heart failure, obstructive sleep apnea on CPAP, hyperlipidemia, vitamin D deficiency, and hypertension, is seen for follow-up of his obesity treatment. He has been on Mounjaro 10 mg weekly, resulting in weight loss but also muscle mass loss. He has made dietary changes, including reducing snacks and soda, and plans to incorporate more physical activity. Discussed the importance of maintaining protein intake to prevent further muscle mass loss and emphasized the need for exercise to improve overall health. Advised against increasing Mounjaro dosage due to sufficient appetite suppression. - Continue Mounjaro 10 mg weekly - Incorporate exercise into daily routine - Monitor protein intake to prevent further muscle mass loss - Encourage continuation of dietary changes  Type  2 Diabetes Mellitus He is on Mounjaro 10 mg weekly. No recent blood sugars.  On Statin therapy.  Not on ACE or ARB.  Not sure if has had annual eye exam this year.  Lab Results  Component Value Date   HGBA1C 6.4 (H) 09/15/2023   HGBA1C 6.8 (H) 06/16/2023   HGBA1C 7.0 (H)  03/16/2023   Lab Results  Component Value Date   MICROALBUR 3.6 (H) 10/03/2021   LDLCALC 44 09/15/2023   CREATININE 1.09 09/15/2023  He has not checked his blood sugars for three months due to a non-functional glucose meter. Interested in obtaining a continuous glucose monitor (CGM) like Dexcom. Discussed the benefits and limitations of CGM, including the need for occasional finger stick checks for accuracy. Advised to consult primary care physician for CGM prescription and potential insurance coverage. - Discuss CGM options with primary care physician - Consider purchasing a glucose meter over the counter or through [employer] as a backup Continue/refill Mounjaro 10 mg weekly She is working  on nutrition plan to decrease simple carbohydrates, increase lean proteins and exercise to promote weight loss and improve glycemic control .  Hypertension His blood pressure is improved today. He is on carvedilol 12.5 mg twice a day and potassium supplements 20 mEq daily. Advised to continue current antihypertensive regimen and monitor blood pressure regularly. Also on torsemide 10 mg BID for volume management with chronic HF.  - Continue current antihypertensive regimen - Monitor blood pressure regularly Continue to work on nutrition plan to promote weight loss and improve BP control.   Hyperlipidemia LDL is at goal. Medication(s): Zocor 20 mg daily. No side effects.  Cardiovascular risk factors: diabetes mellitus, dyslipidemia, hypertension, male gender, microalbuminuria, obesity (BMI >= 30 kg/m2), sedentary lifestyle, and smoking/ tobacco exposure  Lab Results  Component Value Date   CHOL 107 09/15/2023   HDL 39 (L) 09/15/2023   LDLCALC 44 09/15/2023   TRIG 135 09/15/2023   CHOLHDL 4.5 07/31/2022   CHOLHDL 4 10/03/2021   CHOLHDL 4.1 08/23/2020   Lab Results  Component Value Date   ALT 19 09/15/2023   AST 19 09/15/2023   ALKPHOS 141 (H) 09/15/2023   BILITOT 0.7 09/15/2023   The ASCVD Risk  score (Arnett DK, et al., 2019) failed to calculate for the following reasons:   The valid total cholesterol range is 130 to 320 mg/dL  Plan: Continue statin. Continue to work on nutrition plan -decreasing simple carbohydrates, increasing lean proteins, decreasing saturated fats and cholesterol , avoiding trans fats and exercise as able to promote weight loss, improve lipids and decrease cardiovascular risks. Continue Mounjaro to further decrease CV risks.   Vitamin D Deficiency Vitamin D is not at goal of 50.  Most recent vitamin D level was 41.0. He is on OTC vitamin D3 2000 IU daily. Lab Results  Component Value Date   VD25OH 41.0 09/15/2023   VD25OH 21.8 (L) 06/16/2023   VD25OH 10.5 (L) 03/16/2023    Plan: Continue OTC vitamin D3 2000 IU daily Low vitamin D levels can be associated with adiposity and may result in leptin resistance and weight gain. Also associated with fatigue.  Currently on vitamin D supplementation without any adverse effects such as nausea, vomiting or muscle weakness.  Recheck vitamin D level over the next 1-2 months.   General Health Maintenance Advised to quit smoking for overall health improvement. Encouraged to incorporate more physical activity as the weather improves. - Encourage smoking cessation - Incorporate more physical activity as weather improves  Follow-up - Schedule follow-up appointment in four weeks. Follow up labs over next 1-2 months.  Vitals Temp: 98.6 F (37 C) BP: 135/79 Pulse Rate: 89 SpO2: 99 %   Anthropometric Measurements Height: 6\' 3"  (1.905 m) Weight: (!) 343 lb (155.6 kg) BMI (Calculated): 42.87 Weight at Last Visit: 348 lb Weight Lost Since Last Visit: 5 lb Weight Gained Since Last Visit: 0 Starting Weight: 383 lb Total Weight Loss (lbs): 40 lb (18.1 kg) Peak Weight: 383 lb   Body Composition  Body Fat %: 42.5 % Fat Mass (lbs): 145.8 lbs Muscle Mass (lbs): 187.8 lbs Total Body Water (lbs): 161 lbs Visceral  Fat Rating : 27   Other Clinical Data Fasting: no Labs: no Today's Visit #: 16 Starting Date: 07/31/22     ASSESSMENT AND PLAN:  Diet: Shavar is currently in the action stage of change. As such, his goal is to continue with weight loss efforts and has agreed to the Category 4 Plan.   Exercise:  All adults should avoid inactivity. Some activity is better than none, and adults who participate in any amount of physical activity, gain some health benefits.  Behavior Modification:  We discussed the following Behavioral Modification Strategies today: increasing lean protein intake, decreasing simple carbohydrates, increasing vegetables, increase H2O intake, increase high fiber foods, no skipping meals, meal planning and cooking strategies, avoiding temptations, and planning for success. We discussed various medication options to help [place] with his weight loss efforts and we both agreed to continue Mounjaro 10 mg weekly and continue to work on nutritional and behavioral strategies to promote weight loss.  .  Return in about 4 weeks (around 01/26/2024).Marland Kitchen He was informed of the importance of frequent follow up visits to maximize his success with intensive lifestyle modifications for his multiple health conditions.  Attestation Statements:   Reviewed by clinician on day of visit: allergies, medications, problem list, medical history, surgical history, family history, social history, and previous encounter notes.   Time spent on visit including pre-visit chart review and post-visit care and charting was 43 minutes  Leighanne Adolph,PA-C

## 2023-12-29 NOTE — Telephone Encounter (Signed)
Unable to contact patient voice mail is full  °

## 2024-01-01 ENCOUNTER — Encounter: Payer: Self-pay | Admitting: Podiatry

## 2024-01-01 ENCOUNTER — Ambulatory Visit (INDEPENDENT_AMBULATORY_CARE_PROVIDER_SITE_OTHER): Payer: BC Managed Care – PPO | Admitting: Podiatry

## 2024-01-01 DIAGNOSIS — M79675 Pain in left toe(s): Secondary | ICD-10-CM

## 2024-01-01 DIAGNOSIS — Z794 Long term (current) use of insulin: Secondary | ICD-10-CM

## 2024-01-01 DIAGNOSIS — E1165 Type 2 diabetes mellitus with hyperglycemia: Secondary | ICD-10-CM | POA: Diagnosis not present

## 2024-01-01 DIAGNOSIS — B351 Tinea unguium: Secondary | ICD-10-CM

## 2024-01-01 DIAGNOSIS — M79674 Pain in right toe(s): Secondary | ICD-10-CM

## 2024-01-01 NOTE — Progress Notes (Signed)
 This patient returns to my office for at risk foot care.  This patient requires this care by a professional since this patient will be at risk due to having diabetes and venous stasis.  This patient is unable to cut nails himself since the patient cannot reach his nails.These nails are painful walking and wearing shoes.  This patient presents for at risk foot care today. ? ?General Appearance  Alert, conversant and in no acute stress. ? ?Vascular  Dorsalis pedis and posterior tibial  pulses are not palpable  bilaterally due to severe swelling..  Capillary return is within normal limits  bilaterally. Temperature is within normal limits  bilaterally. ? ?Neurologic  Senn-Weinstein monofilament wire test within normal limits  bilaterally. Muscle power within normal limits bilaterally. ? ?Nails Thick disfigured discolored nails with subungual debris  from hallux to fifth toes bilaterally. No evidence of bacterial infection or drainage bilaterally. ? ?Orthopedic  No limitations of motion  feet .  No crepitus or effusions noted.  No bony pathology or digital deformities noted. ? ?Skin  normotropic skin with no porokeratosis noted bilaterally.  No signs of infections or ulcers noted.    ? ?Onychomycosis  Pain in right toes  Pain in left toes  Swelling legs/feet ? ?Consent was obtained for treatment procedures.   Mechanical debridement of nails 1-5  bilaterally performed with a nail nipper.  Filed with dremel without incident.  ? ? ?Return office visit   3 months                   Told patient to return for periodic foot care and evaluation due to potential at risk complications. ? ? ?Helane Gunther DPM  ?

## 2024-01-13 ENCOUNTER — Other Ambulatory Visit: Payer: Self-pay | Admitting: Family Medicine

## 2024-01-13 DIAGNOSIS — G4733 Obstructive sleep apnea (adult) (pediatric): Secondary | ICD-10-CM | POA: Diagnosis not present

## 2024-01-22 DIAGNOSIS — H35033 Hypertensive retinopathy, bilateral: Secondary | ICD-10-CM | POA: Diagnosis not present

## 2024-01-22 DIAGNOSIS — E119 Type 2 diabetes mellitus without complications: Secondary | ICD-10-CM | POA: Diagnosis not present

## 2024-01-22 LAB — HM DIABETES EYE EXAM

## 2024-01-23 ENCOUNTER — Other Ambulatory Visit: Payer: Self-pay | Admitting: Family Medicine

## 2024-01-23 DIAGNOSIS — E876 Hypokalemia: Secondary | ICD-10-CM

## 2024-01-24 ENCOUNTER — Emergency Department (HOSPITAL_COMMUNITY)

## 2024-01-24 ENCOUNTER — Encounter (HOSPITAL_COMMUNITY): Payer: Self-pay | Admitting: Emergency Medicine

## 2024-01-24 ENCOUNTER — Other Ambulatory Visit: Payer: Self-pay

## 2024-01-24 ENCOUNTER — Emergency Department (HOSPITAL_COMMUNITY)
Admission: EM | Admit: 2024-01-24 | Discharge: 2024-01-24 | Disposition: A | Discharge status: Home / Self Care | Attending: Emergency Medicine | Admitting: Emergency Medicine

## 2024-01-24 DIAGNOSIS — M47814 Spondylosis without myelopathy or radiculopathy, thoracic region: Secondary | ICD-10-CM | POA: Diagnosis not present

## 2024-01-24 DIAGNOSIS — W19XXXA Unspecified fall, initial encounter: Secondary | ICD-10-CM | POA: Diagnosis not present

## 2024-01-24 DIAGNOSIS — S29012A Strain of muscle and tendon of back wall of thorax, initial encounter: Secondary | ICD-10-CM | POA: Diagnosis not present

## 2024-01-24 DIAGNOSIS — M25571 Pain in right ankle and joints of right foot: Secondary | ICD-10-CM | POA: Diagnosis not present

## 2024-01-24 DIAGNOSIS — S93401A Sprain of unspecified ligament of right ankle, initial encounter: Secondary | ICD-10-CM | POA: Insufficient documentation

## 2024-01-24 DIAGNOSIS — Y92009 Unspecified place in unspecified non-institutional (private) residence as the place of occurrence of the external cause: Secondary | ICD-10-CM | POA: Diagnosis not present

## 2024-01-24 DIAGNOSIS — E119 Type 2 diabetes mellitus without complications: Secondary | ICD-10-CM | POA: Insufficient documentation

## 2024-01-24 DIAGNOSIS — M546 Pain in thoracic spine: Secondary | ICD-10-CM | POA: Diagnosis not present

## 2024-01-24 DIAGNOSIS — Z7982 Long term (current) use of aspirin: Secondary | ICD-10-CM | POA: Diagnosis not present

## 2024-01-24 DIAGNOSIS — Z79899 Other long term (current) drug therapy: Secondary | ICD-10-CM | POA: Diagnosis not present

## 2024-01-24 DIAGNOSIS — Z794 Long term (current) use of insulin: Secondary | ICD-10-CM | POA: Diagnosis not present

## 2024-01-24 DIAGNOSIS — I10 Essential (primary) hypertension: Secondary | ICD-10-CM | POA: Insufficient documentation

## 2024-01-24 DIAGNOSIS — S99911A Unspecified injury of right ankle, initial encounter: Secondary | ICD-10-CM | POA: Diagnosis not present

## 2024-01-24 DIAGNOSIS — M438X4 Other specified deforming dorsopathies, thoracic region: Secondary | ICD-10-CM | POA: Diagnosis not present

## 2024-01-24 DIAGNOSIS — S99811A Other specified injuries of right ankle, initial encounter: Secondary | ICD-10-CM | POA: Diagnosis not present

## 2024-01-24 DIAGNOSIS — M7731 Calcaneal spur, right foot: Secondary | ICD-10-CM | POA: Diagnosis not present

## 2024-01-24 MED ORDER — TRAMADOL HCL 50 MG PO TABS
50.0000 mg | ORAL_TABLET | Freq: Once | ORAL | Status: AC
  Administered 2024-01-24: 50 mg via ORAL
  Filled 2024-01-24: qty 1

## 2024-01-24 MED ORDER — HYDROMORPHONE HCL 1 MG/ML IJ SOLN
1.0000 mg | Freq: Once | INTRAMUSCULAR | Status: AC
  Administered 2024-01-24: 1 mg via INTRAMUSCULAR
  Filled 2024-01-24: qty 1

## 2024-01-24 MED ORDER — TRAMADOL HCL 50 MG PO TABS: 50.0000 mg | ORAL_TABLET | Freq: Four times a day (QID) | ORAL | 0 refills | Status: DC | PRN

## 2024-01-24 NOTE — Discharge Instructions (Addendum)
 Use a walker to ambulate with.  Do not wear the boot on your foot and ankle unless you are ambulating.  Follow-up with your doctor in 1 week

## 2024-01-24 NOTE — ED Provider Notes (Signed)
 [place] EMERGENCY DEPARTMENT AT [facility] Provider Note   CSN: [id number] Arrival date & time: 01/24/24  1104     History {Add pertinent medical, surgical, social history, OB history to HPI:1} Chief Complaint  Patient presents with   Luis [name] is a 58 y.o. male.  Patient had a fall and complains of upper back pain and right ankle pain.   Fall       Home Medications Prior to Admission medications   Medication Sig Start Date End Date Taking? Authorizing Provider  traMADol (ULTRAM) 50 MG tablet Take 1 tablet (50 mg total) by mouth every 6 (six) hours as needed. 01/24/24  Yes Bethann Berkshire, MD  albuterol (VENTOLIN HFA) 108 (90 Base) MCG/ACT inhaler Inhale 1-2 puffs into the lungs every 6 (six) hours as needed for wheezing or shortness of breath. 04/24/23   Sponseller, Eugene Gavia, PA-C  aspirin EC 81 MG EC tablet Take 1 tablet (81 mg total) by mouth daily. 03/26/16   Dorothea Ogle, MD  carvedilol (COREG) 12.5 MG tablet Take 1 tablet (12.5 mg total) by mouth 2 (two) times daily with a meal. 09/15/23   [name], Fanny Bien, PA-C  Cholecalciferol (VITAMIN D3) 50 MCG (2000 UT) capsule Take 1 capsule (2,000 Units total) by mouth daily. 11/17/23   Worthy Rancher, MD  glucose blood test strip Check glucose TID/ E11.9 06/25/22   Ardith Dark, MD  Insulin Pen Needle 31G X 8 MM MISC Use to inject insulin twice daily 04/13/19   Ardith Dark, MD  Insulin Pen Needle 32G X 6 MM MISC Use to administer insulin two times daily 04/05/18   Ardith Dark, MD  Multiple Vitamins-Minerals (MULTIVITAMIN WITH MINERALS) tablet Take 1 tablet by mouth daily. 11/17/23   Worthy Rancher, MD  OneTouch Delica Lancets 33G MISC Use TID/ E11.9 06/06/21   Ardith Dark, MD  potassium chloride SA (KLOR-CON M) 20 MEQ tablet TAKE 1 TABLET(20 MEQ) BY MOUTH DAILY 12/21/23   Ardith Dark, MD  simvastatin (ZOCOR) 20 MG tablet TAKE 1 TABLET(20 MG) BY MOUTH AT BEDTIME 01/13/24    Ardith Dark, MD  tirzepatide [facility]) 10 MG/0.5ML Pen Inject 10 mg into the skin once a week. 12/29/23   [name], Fanny Bien, PA-C  torsemide (DEMADEX) 10 MG tablet TAKE 1 TABLET(10 MG) BY MOUTH TWICE DAILY 12/23/23   Ardith Dark, MD      Allergies    Demerol [meperidine] and Morphine and codeine    Review of Systems   Review of Systems  Physical Exam Updated Vital Signs BP (!) 149/109 (BP Location: Right Arm)   Pulse 92   Temp 98.6 F (37 C)   Resp 16   Ht 6\' 3"  (1.905 m)   Wt (!) 155.6 kg   SpO2 95%   BMI 42.87 kg/m  Physical Exam  ED Results / Procedures / Treatments   Labs (all labs ordered are listed, but only abnormal results are displayed) Labs Reviewed - No data to display  EKG EKG Interpretation Date/Time:  Sunday January 24 2024 11:31:34 EDT Ventricular Rate:  102 PR Interval:  46 QRS Duration:  111 QT Interval:  379 QTC Calculation: 494 R Axis:   78  Text Interpretation: Sinus tachycardia Nonspecific T abnormalities, inferior leads Borderline prolonged QT interval Confirmed by Bethann Berkshire [phone]) on 01/24/2024 4:03:42 PM  Radiology DG Thoracic Spine 2 View Result Date: 01/24/2024 CLINICAL DATA:  Fall, pain. EXAM: THORACIC SPINE  2 VIEWS COMPARISON:  06/28/2021. FINDINGS: Mild S shaped curvature of the thoracic spine. Alignment is otherwise anatomic. Flowing anterior marginal osteophytosis. Cervicothoracic junction is obscured by the patient's shoulders. IMPRESSION: 1. No acute findings. 2. Mild degenerative changes in the spine. Electronically Signed   By: Leanna Battles M.D.   On: 01/24/2024 14:33   DG Ankle Complete Right Result Date: 01/24/2024 CLINICAL DATA:  Fall and pain. EXAM: RIGHT ANKLE - COMPLETE 3+ VIEW COMPARISON:  None Available. FINDINGS: Apparent diffuse soft tissue swelling is likely due to patient body habitus. No acute fracture or dislocation. Tiny calcaneal spur. Base of fifth metatarsal and talar dome intact. IMPRESSION: No  acute osseous abnormality. Electronically Signed   By: Jeronimo Greaves M.D.   On: 01/24/2024 13:34    Procedures Procedures  {Document cardiac monitor, telemetry assessment procedure when appropriate:1}  Medications Ordered in ED Medications  HYDROmorphone (DILAUDID) injection 1 mg (1 mg Intramuscular Given 01/24/24 1335)    ED Course/ Medical Decision Making/ A&P   {   Click here for ABCD2, HEART and other calculatorsREFRESH Note before signing :1}                              Medical Decision Making Amount and/or Complexity of Data Reviewed Radiology: ordered.  Risk Prescription drug management.   Patient with a sprain right ankle and strained with contusion upper back.  Patient given Ultram and will follow-up with PCP.  She is also given a cam walker  {Document critical care time when appropriate:1} {Document review of labs and clinical decision tools ie heart score, Chads2Vasc2 etc:1}  {Document your independent review of radiology images, and any outside records:1} {Document your discussion with family members, caretakers, and with consultants:1} {Document social determinants of health affecting pt's care:1} {Document your decision making why or why not admission, treatments were needed:1} Final Clinical Impression(s) / ED Diagnoses Final diagnoses:  Fall, initial encounter    Rx / DC Orders ED Discharge Orders          Ordered    traMADol (ULTRAM) 50 MG tablet  Every 6 hours PRN        01/24/24 1616

## 2024-01-24 NOTE — ED Triage Notes (Signed)
 Pt fell yesterday at home onto left side did not hit head. Pt having pain in right ankle and upper back. Denies N/V/D. Pt does take baby aspirin daily and did take this morning.

## 2024-01-25 ENCOUNTER — Encounter: Payer: Self-pay | Admitting: Podiatry

## 2024-01-25 ENCOUNTER — Encounter: Payer: Self-pay | Admitting: Family Medicine

## 2024-01-26 NOTE — Progress Notes (Unsigned)
 SUBJECTIVE: Discussed the use of AI scribe software for clinical note transcription with the patient, who gave verbal consent to proceed.  Chief Complaint: Obesity  Interim History: He is down 11 lbs since his last visit.  Down 51 lbs overall TBW loss of 13.3%  Luis [name] is here to discuss his progress with his obesity treatment plan. He is on the Category 3 Plan and states he is following his eating plan approximately 90 % of the time. He states he is exercising 10 minutes 3 times per week.  Luis [name] "Luis [name]" is a 58 year old male with obesity who presents for a follow-up on his obesity treatment plan.  He is diligently following his nutrition plan and aims to lose 30 more pounds to reach a weight under 300 pounds.  He is currently on Mounjaro, which helps control his appetite without adverse effects.  He is mindful of eating healthy foods and has adjusted well to his meal plan. He plans to increase his physical activity, such as peddling, to aid in weight loss.  He used to walk regularly before COVID and is considering resuming this activity.  He has a history of type 2 diabetes and is currently on insulin, administering 24 units every two to three days. He has not checked his blood sugars recently as he needs to acquire a meter. His last A1c was 6.4. No vision changes are noted, and a recent eye exam showed no significant diabetic retinopathy.  He has hypertension and hyperlipidemia. No symptoms such as difficulty swallowing, nausea, vomiting, or changes in vision are present. He takes over-the-counter vitamin D daily, as previously prescribed vitamin D was discontinued. His last B12 level was low, and he is due for a recheck.  He is working on reducing his smoking habit, currently not smoking a pack or even half a pack a day. Socially, he is accustomed to his nutrition plan and enjoys fruits, particularly green apples. OBJECTIVE: Visit Diagnoses: Problem List Items Addressed This  Visit     Chronic diastolic CHF (congestive heart failure) (HCC), ECHO 03/2017 EF 50-55%, on Torsemide   Morbid obesity (HCC)   Relevant Medications   tirzepatide (MOUNJARO) 10 MG/0.5ML Pen   Vitamin D deficiency   Relevant Orders   VITAMIN D 25 Hydroxy (Vit-D Deficiency, Fractures)   B12 deficiency   Relevant Orders   Vitamin B12   BMI 40.0-44.9, adult (HCC)   Relevant Medications   tirzepatide (MOUNJARO) 10 MG/0.5ML Pen   Hypertension associated with diabetes (HCC) (Chronic)   Relevant Medications   tirzepatide (MOUNJARO) 10 MG/0.5ML Pen   Hyperlipidemia associated with type 2 diabetes mellitus (HCC) (Chronic)   Relevant Medications   tirzepatide (MOUNJARO) 10 MG/0.5ML Pen   Diabetes (HCC) - Primary (Chronic)   Relevant Medications   tirzepatide (MOUNJARO) 10 MG/0.5ML Pen   Other Relevant Orders   CMP14+EGFR   Hemoglobin A1c  Obesity He is diligently following a weight loss plan and has adjusted well to Luis [name], which suppresses appetite and facilitates healthier food choices. He aims to lose 30 more pounds to be under 300 pounds. No side effects such as constipation, diarrhea, nausea, vomiting, or difficulty swallowing have been reported. He is accustomed to his meal plan and enjoys healthy food choices. Increasing physical activity is recommended to improve muscle strength and balance, especially after a fall. - Continue Mounjaro 10 mg weekly - Encourage healthy eating and exercise - Discuss potential for stopping insulin if weight loss continues - Encourage physical activity, such  as peddling or walking  Type 2 Diabetes Mellitus He is on insulin, administering 24 units every two to three days. His last A1c was 6.4, indicating good control. He has not been checking his blood sugars regularly due to not having a meter. There is a consideration to discontinue insulin if weight loss continues and A1c remains controlled. On Mounjaro 10 mg weekly. Denies mass in neck, dysphagia,  dyspepsia, persistent hoarseness, abdominal pain, or N/V/Constipation or diarrhea. Has annual eye exam. Mood is stable.   Lab Results  Component Value Date   HGBA1C 6.4 (H) 09/15/2023   HGBA1C 6.8 (H) 06/16/2023   HGBA1C 7.0 (H) 03/16/2023   Lab Results  Component Value Date   MICROALBUR 3.6 (H) 10/03/2021   LDLCALC 44 09/15/2023   CREATININE 1.09 09/15/2023  - Order A1c test today and CMET - Encourage obtaining a blood glucose meter- PCP Dr. [name] - Consider discontinuing insulin based on future A1c results ( not sure of current formulation of insulin used)  May need to increase Mounjaro further to help with diabetic control if insulin discontinued.  He is working  on [employer] to decrease simple carbohydrates, increase lean proteins and exercise to promote weight loss and improve glycemic control and is now 90% compliant with his program and making great progress.   Hyperlipidemia LDL is at goal. Medication(s): Zocor 20 mg daily. No side effects reported.  Cardiovascular risk factors: diabetes mellitus, dyslipidemia, family history of premature cardiovascular disease, hypertension, male gender, microalbuminuria, obesity (BMI >= 30 kg/m2), sedentary lifestyle, and smoking/ tobacco exposure  Lab Results  Component Value Date   CHOL 107 09/15/2023   HDL 39 (L) 09/15/2023   LDLCALC 44 09/15/2023   TRIG 135 09/15/2023   CHOLHDL 4.5 07/31/2022   CHOLHDL 4 10/03/2021   CHOLHDL 4.1 08/23/2020   Lab Results  Component Value Date   ALT 19 09/15/2023   AST 19 09/15/2023   ALKPHOS 141 (H) 09/15/2023   BILITOT 0.7 09/15/2023   The ASCVD Risk score (Arnett DK, et al., 2019) failed to calculate for the following reasons:   The valid total cholesterol range is 130 to 320 mg/dL Plan: Continue statin. Continue Mounjaro which should also decrease CV risks Continue to work on nutrition plan -decreasing simple carbohydrates, increasing lean proteins, decreasing saturated fats and  cholesterol , avoiding trans fats and exercise as able to promote weight loss, improve lipids and decrease cardiovascular risks. Will need to recheck lipids in next 2-3 months.      Chronic Heart Failure/ NICM/HTN Weight loss and healthy lifestyle changes are likely beneficial for chronic heart failure management. BP Readings from Last 3 Encounters:  01/27/24 (!) 142/89  01/24/24 (!) 154/86  12/29/23 135/79  Plan: Continue current management program.  Continue to work on nutrition plan to promote weight loss and improve BP control/improve CM/HF management.    Vitamin D Deficiency He is taking over-the-counter vitamin D daily after being taken off the prescribed version. His vitamin D levels need monitoring. Last vitamin D Lab Results  Component Value Date   VD25OH 41.0 09/15/2023   Low vitamin D levels can be associated with adiposity and may result in leptin resistance and weight gain. Also associated with fatigue.  Currently on vitamin D supplementation without any adverse effects such as nausea, vomiting or muscle weakness.  - Order vitamin D level test  Vitamin B12 Deficiency His last B12 level was low Lab Results  Component Value Date   VITAMINB12 324 09/15/2023  Order vitamin B12 level test Supplementation if indicated.   Smoking Cessation He has reduced smoking but has not quit entirely. He acknowledges the health benefits of quitting and has successfully quit in the past. Continued reduction and eventual cessation are encouraged. - Encourage smoking cessation - Continue to reduce smoking  Follow-up He is advised to get his labs done and follow up for further evaluation based on the results. - Order chemistries - Schedule follow-up appointment after lab results  Vitals Temp: 98.2 F (36.8 C) BP: (!) 142/89 Pulse Rate: 90 SpO2: 97 %   Anthropometric Measurements Height: 6\' 3"  (1.905 m) Weight: (!) 332 lb (150.6 kg) BMI (Calculated): 41.5 Weight at  Last Visit: 343 lb Weight Lost Since Last Visit: 11 lb Weight Gained Since Last Visit: 0 lb Starting Weight: 383 lb Total Weight Loss (lbs): 51 lb (23.1 kg) Peak Weight: 383 lb   Body Composition  Body Fat %: 40.8 % Fat Mass (lbs): 135.6 lbs Muscle Mass (lbs): 187.2 lbs Total Body Water (lbs): 155 lbs Visceral Fat Rating : 26   Other Clinical Data Fasting: yes Labs: no Today's Visit #: 17 Starting Date: 07/31/22     ASSESSMENT AND PLAN:  Diet: Luis [name] is currently in the action stage of change. As such, his goal is to continue with weight loss efforts. He has agreed to Category 3 Plan.  Exercise: Luis [name] has been instructed to work up to a goal of 150 minutes of combined cardio and strengthening exercise per week and that some exercise is better than none for weight loss and overall health benefits.   Behavior Modification:  We discussed the following Behavioral Modification Strategies today: increasing lean protein intake, decreasing simple carbohydrates, increasing vegetables, increase H2O intake, increase high fiber foods, no skipping meals, meal planning and cooking strategies, avoiding temptations, and planning for success. We discussed various medication options to help Luis [name] with his weight loss efforts and we both agreed to continue Mounjaro 10 mg weekly for Type 2 diabetes management and continue to work on nutritional and behavioral strategies to promote weight loss.  .  Return in about 4 weeks (around 02/24/2024).Marland Kitchen He was informed of the importance of frequent follow up visits to maximize his success with intensive lifestyle modifications for his multiple health conditions.  Attestation Statements:   Reviewed by clinician on day of visit: allergies, medications, problem list, medical history, surgical history, family history, social history, and previous encounter notes.   Time spent on visit including pre-visit chart review and post-visit care and charting  was 34 minutes.    Georgeann Brinkman, PA-C

## 2024-01-26 NOTE — Telephone Encounter (Signed)
**Note De-identified  [place] Obfuscation** Please advise 

## 2024-01-27 ENCOUNTER — Ambulatory Visit (INDEPENDENT_AMBULATORY_CARE_PROVIDER_SITE_OTHER): Payer: BC Managed Care – PPO | Admitting: Physician Assistant

## 2024-01-27 ENCOUNTER — Encounter (INDEPENDENT_AMBULATORY_CARE_PROVIDER_SITE_OTHER): Payer: Self-pay | Admitting: Physician Assistant

## 2024-01-27 ENCOUNTER — Other Ambulatory Visit: Payer: Self-pay | Admitting: *Deleted

## 2024-01-27 VITALS — BP 142/89 | HR 90 | Temp 98.2°F | Ht 75.0 in | Wt 332.0 lb

## 2024-01-27 DIAGNOSIS — E538 Deficiency of other specified B group vitamins: Secondary | ICD-10-CM | POA: Diagnosis not present

## 2024-01-27 DIAGNOSIS — E785 Hyperlipidemia, unspecified: Secondary | ICD-10-CM | POA: Diagnosis not present

## 2024-01-27 DIAGNOSIS — E559 Vitamin D deficiency, unspecified: Secondary | ICD-10-CM | POA: Diagnosis not present

## 2024-01-27 DIAGNOSIS — E1169 Type 2 diabetes mellitus with other specified complication: Secondary | ICD-10-CM | POA: Diagnosis not present

## 2024-01-27 DIAGNOSIS — I152 Hypertension secondary to endocrine disorders: Secondary | ICD-10-CM

## 2024-01-27 DIAGNOSIS — I5032 Chronic diastolic (congestive) heart failure: Secondary | ICD-10-CM

## 2024-01-27 DIAGNOSIS — Z7985 Long-term (current) use of injectable non-insulin antidiabetic drugs: Secondary | ICD-10-CM

## 2024-01-27 DIAGNOSIS — Z6841 Body Mass Index (BMI) 40.0 and over, adult: Secondary | ICD-10-CM

## 2024-01-27 DIAGNOSIS — Z794 Long term (current) use of insulin: Secondary | ICD-10-CM | POA: Diagnosis not present

## 2024-01-27 DIAGNOSIS — E1165 Type 2 diabetes mellitus with hyperglycemia: Secondary | ICD-10-CM

## 2024-01-27 DIAGNOSIS — E1159 Type 2 diabetes mellitus with other circulatory complications: Secondary | ICD-10-CM | POA: Diagnosis not present

## 2024-01-27 MED ORDER — FREESTYLE LIBRE 3 SENSOR MISC: 5 refills | Status: DC

## 2024-01-27 MED ORDER — TIRZEPATIDE 10 MG/0.5ML ~~LOC~~ SOAJ
10.0000 mg | SUBCUTANEOUS | 0 refills | Status: DC
  Filled 2024-02-02: qty 2, 28d supply, fill #0

## 2024-01-27 NOTE — Telephone Encounter (Signed)
 Ok to order [employer].  [name]. [name], MD 01/27/2024 10:54 AM

## 2024-01-27 NOTE — Telephone Encounter (Signed)
 Rx freestyle libre send to pharmacy

## 2024-01-28 ENCOUNTER — Other Ambulatory Visit (INDEPENDENT_AMBULATORY_CARE_PROVIDER_SITE_OTHER): Payer: Self-pay | Admitting: Physician Assistant

## 2024-01-28 ENCOUNTER — Other Ambulatory Visit: Payer: Self-pay | Admitting: Family Medicine

## 2024-01-28 DIAGNOSIS — E1159 Type 2 diabetes mellitus with other circulatory complications: Secondary | ICD-10-CM

## 2024-01-28 LAB — CMP14+EGFR
ALT: 21 IU/L (ref 0–44)
AST: 18 IU/L (ref 0–40)
Albumin: 4.2 g/dL (ref 3.8–4.9)
Alkaline Phosphatase: 142 IU/L — ABNORMAL HIGH (ref 44–121)
BUN/Creatinine Ratio: 9 (ref 9–20)
BUN: 12 mg/dL (ref 6–24)
Bilirubin Total: 0.7 mg/dL (ref 0.0–1.2)
CO2: 21 mmol/L (ref 20–29)
Calcium: 8.9 mg/dL (ref 8.7–10.2)
Chloride: 102 mmol/L (ref 96–106)
Creatinine, Ser: 1.3 mg/dL — ABNORMAL HIGH (ref 0.76–1.27)
Globulin, Total: 3.1 g/dL (ref 1.5–4.5)
Glucose: 101 mg/dL — ABNORMAL HIGH (ref 70–99)
Potassium: 4 mmol/L (ref 3.5–5.2)
Sodium: 142 mmol/L (ref 134–144)
Total Protein: 7.3 g/dL (ref 6.0–8.5)
eGFR: 65 mL/min/{1.73_m2} (ref >59)

## 2024-01-28 LAB — VITAMIN B12: Vitamin B-12: 359 pg/mL (ref 232–1245)

## 2024-01-28 LAB — HEMOGLOBIN A1C
Est. average glucose Bld gHb Est-mCnc: 128 mg/dL
Hgb A1c MFr Bld: 6.1 % — ABNORMAL HIGH (ref 4.8–5.6)

## 2024-01-28 LAB — VITAMIN D 25 HYDROXY (VIT D DEFICIENCY, FRACTURES): Vit D, 25-Hydroxy: 84 ng/mL (ref 30.0–100.0)

## 2024-01-29 ENCOUNTER — Telehealth: Payer: Self-pay

## 2024-01-29 ENCOUNTER — Other Ambulatory Visit (HOSPITAL_COMMUNITY): Payer: Self-pay

## 2024-01-29 NOTE — Telephone Encounter (Signed)
 The Freestyle Libre 2 and 3 sensors are being discontinued September 2025. We are highly encouraging Dr's to go ahead and switch to the [place] 2 plus or the [place] 3 plus instead of doing the prior authorization for the old ones.

## 2024-02-01 ENCOUNTER — Encounter (INDEPENDENT_AMBULATORY_CARE_PROVIDER_SITE_OTHER): Payer: Self-pay | Admitting: Physician Assistant

## 2024-02-01 ENCOUNTER — Other Ambulatory Visit: Payer: Self-pay | Admitting: *Deleted

## 2024-02-01 ENCOUNTER — Other Ambulatory Visit (INDEPENDENT_AMBULATORY_CARE_PROVIDER_SITE_OTHER): Payer: Self-pay | Admitting: Physician Assistant

## 2024-02-01 DIAGNOSIS — Z794 Long term (current) use of insulin: Secondary | ICD-10-CM

## 2024-02-01 MED ORDER — FREESTYLE LIBRE 3 PLUS SENSOR MISC: 1 refills | Status: AC

## 2024-02-01 NOTE — Telephone Encounter (Signed)
 Noted.

## 2024-02-01 NOTE — Telephone Encounter (Signed)
 Style libre 3 plus order send to pharmacy

## 2024-02-02 ENCOUNTER — Other Ambulatory Visit (HOSPITAL_COMMUNITY): Payer: Self-pay

## 2024-02-02 ENCOUNTER — Other Ambulatory Visit (INDEPENDENT_AMBULATORY_CARE_PROVIDER_SITE_OTHER): Payer: Self-pay | Admitting: Physician Assistant

## 2024-02-02 DIAGNOSIS — Z794 Long term (current) use of insulin: Secondary | ICD-10-CM

## 2024-02-04 ENCOUNTER — Other Ambulatory Visit: Payer: Self-pay | Admitting: *Deleted

## 2024-02-04 MED ORDER — GLUCOSE BLOOD VI STRP: ORAL_STRIP | 12 refills | Status: AC

## 2024-02-09 ENCOUNTER — Telehealth: Payer: Self-pay

## 2024-02-09 NOTE — Telephone Encounter (Signed)
*  Primary  Pharmacy Patient Advocate Encounter   Received notification from CoverMyMeds that prior authorization for FreeStyle Libre 3 Plus Sensor  is required/requested.   Insurance verification completed.   The patient is insured through [facility] .   Per test claim:  Dexcom CGM is preferred by the insurance.  If suggested medication is appropriate, Please send in a new RX and discontinue this one. If not, please advise as to why it's not appropriate so that we may request a Prior Authorization. Please note, some preferred medications may still require a PA.  If the suggested medications have not been trialed and there are no contraindications to their use, the PA will not be submitted, as it will not be approved.   CMM Key: [id number]

## 2024-02-10 ENCOUNTER — Other Ambulatory Visit (HOSPITAL_COMMUNITY): Payer: Self-pay

## 2024-02-10 ENCOUNTER — Telehealth: Payer: Self-pay

## 2024-02-10 NOTE — Telephone Encounter (Signed)
 Clinical questiions answered and PA submitted.

## 2024-02-10 NOTE — Telephone Encounter (Signed)
 ERROR

## 2024-02-10 NOTE — Telephone Encounter (Signed)
 Please send a PA thanks

## 2024-02-11 ENCOUNTER — Other Ambulatory Visit (HOSPITAL_COMMUNITY): Payer: Self-pay

## 2024-02-11 ENCOUNTER — Telehealth: Payer: Self-pay

## 2024-02-11 NOTE — Telephone Encounter (Signed)
 Pharmacy Patient Advocate Encounter  Received notification from [facility] that Prior Authorization for Dexcom G7 sensor  has been DENIED.  Full denial letter will be uploaded to the media tab. See denial reason below.   PA #/Case ID/Reference #: [id number]

## 2024-02-11 NOTE — Telephone Encounter (Signed)
 Pharmacy Patient Advocate Encounter   Received notification from  [employer]  that prior authorization for Dexcom G7 sensor is required/requested.   Insurance verification completed.   The patient is insured through [facility] .   Per test claim: PA required; PA submitted to above mentioned insurance via CoverMyMeds Key/confirmation #/EOC [id number] Status is pending

## 2024-02-11 NOTE — Telephone Encounter (Addendum)
 Pharmacy Patient Advocate Encounter  Received notification from [facility] that Prior Authorization for Freestyle Libre 3 Plus Sensor has been DENIED.  Full denial letter will be uploaded to the media tab. See denial reason below.   PA #/Case ID/Reference #: [id number]     Will submit a PA for the Dexcom sensors

## 2024-02-11 NOTE — Telephone Encounter (Signed)
Unable to contact patient, voice mail is full  

## 2024-02-15 ENCOUNTER — Telehealth: Payer: Self-pay | Admitting: *Deleted

## 2024-02-15 NOTE — Telephone Encounter (Signed)
 See previews phone call note

## 2024-02-15 NOTE — Telephone Encounter (Signed)
 Copied from CRM [id number]. Topic: General - Other >> Feb 11, 2024  2:16 PM Adonis Hoot wrote: Reason for CRM: Patient returned call regarding denial of Freestyle libre. I relayed message to him and he stated that he received the letter as well.   Noted  Tilmon Wisehart,RMA

## 2024-02-22 ENCOUNTER — Other Ambulatory Visit (INDEPENDENT_AMBULATORY_CARE_PROVIDER_SITE_OTHER): Payer: Self-pay | Admitting: Physician Assistant

## 2024-02-22 DIAGNOSIS — I152 Hypertension secondary to endocrine disorders: Secondary | ICD-10-CM

## 2024-02-24 ENCOUNTER — Ambulatory Visit (INDEPENDENT_AMBULATORY_CARE_PROVIDER_SITE_OTHER): Admitting: Physician Assistant

## 2024-02-24 ENCOUNTER — Other Ambulatory Visit (HOSPITAL_COMMUNITY): Payer: Self-pay

## 2024-02-24 ENCOUNTER — Encounter (INDEPENDENT_AMBULATORY_CARE_PROVIDER_SITE_OTHER): Payer: Self-pay | Admitting: Physician Assistant

## 2024-02-24 VITALS — BP 109/71 | HR 96 | Temp 98.2°F | Ht 75.0 in | Wt 336.0 lb

## 2024-02-24 DIAGNOSIS — I152 Hypertension secondary to endocrine disorders: Secondary | ICD-10-CM | POA: Diagnosis not present

## 2024-02-24 DIAGNOSIS — E1165 Type 2 diabetes mellitus with hyperglycemia: Secondary | ICD-10-CM | POA: Diagnosis not present

## 2024-02-24 DIAGNOSIS — Z6841 Body Mass Index (BMI) 40.0 and over, adult: Secondary | ICD-10-CM

## 2024-02-24 DIAGNOSIS — E1159 Type 2 diabetes mellitus with other circulatory complications: Secondary | ICD-10-CM

## 2024-02-24 DIAGNOSIS — Z7985 Long-term (current) use of injectable non-insulin antidiabetic drugs: Secondary | ICD-10-CM

## 2024-02-24 DIAGNOSIS — Z720 Tobacco use: Secondary | ICD-10-CM | POA: Diagnosis not present

## 2024-02-24 DIAGNOSIS — Z794 Long term (current) use of insulin: Secondary | ICD-10-CM

## 2024-02-24 MED ORDER — TIRZEPATIDE 10 MG/0.5ML ~~LOC~~ SOAJ
10.0000 mg | SUBCUTANEOUS | 0 refills | Status: DC
  Filled 2024-02-24 – 2024-03-17 (×2): qty 2, 28d supply, fill #0

## 2024-02-24 NOTE — Progress Notes (Unsigned)
 SUBJECTIVE: Discussed the use of AI scribe software for clinical note transcription with the patient, who gave verbal consent to proceed.  Chief Complaint: Obesity  Interim History: He is up 4 lbs since last visit Down 47 lbs overall TBW loss of 12.3%  Luis [name] is here to discuss his progress with his obesity treatment plan. He is on the Category 3 Plan and states he is following his eating plan approximately 50 % of the time. He states he is exercising 10 minutes 3 times per week.  Luis [name] "[name]" is a 58 year old male with type two diabetes, hypertension, hyperlipidemia, and chronic heart failure who presents for follow-up of his obesity treatment plan.  He has been on Mounjaro  10 mg weekly, resulting in a total weight loss of 47 pounds, reducing his BMI from 50 to 42. He experiences a lack of appetite but maintains regular meals three times a day. He occasionally indulges during celebrations but otherwise adheres to his meal plan. No nausea, constipation, or diarrhea with Mounjaro . His blood sugar levels have improved significantly, with recent readings in the 90s and occasional spikes to 115. He no longer requires insulin  shots as frequently.  He acknowledges the need to increase his physical activity, mentioning limited progress since the last visit. He is not inclined towards gym workouts but is considering home-based exercises and possibly participating in structured programs. He recalls losing weight previously through walking and is interested in resuming such activities.  He continues to smoke and recognizes the need to quit, identifying it as a goal alongside improving exercise and maintaining consistent eating habits.    OBJECTIVE: Visit Diagnoses: Problem List Items Addressed This Visit     Morbid obesity (HCC)   Relevant Medications   tirzepatide  (MOUNJARO ) 10 MG/0.5ML Pen   BMI 40.0-44.9, adult (HCC)   Relevant Medications   tirzepatide  (MOUNJARO ) 10 MG/0.5ML  Pen   Hypertension associated with diabetes (HCC) (Chronic)   Relevant Medications   tirzepatide  (MOUNJARO ) 10 MG/0.5ML Pen   Diabetes (HCC) - Primary (Chronic)   Relevant Medications   tirzepatide  (MOUNJARO ) 10 MG/0.5ML Pen   Other Visit Diagnoses       Tobacco use         Obesity Obesity management is ongoing with significant progress. Current BMI is 42, down from a peak of 50, with a total weight loss of 47 pounds. Mounjaro  10 mg weekly has been effective in weight reduction and appetite suppression without significant side effects. He has been mostly adherent to the meal plan, with occasional deviations during celebrations. Exercise routine needs improvement to maintain and further weight loss. Options for structured exercise programs were discussed, including the Right Start program at [place] and the [facility] program through [employer]. - Continue Mounjaro  10 mg weekly. - Encourage participation in structured exercise programs such as the Right Start program at [facility] or the [facility] program through [employer]. - Advise on incorporating weight training and enhancing walking with wrist weights or a weighted vest. - Discuss the importance of consistent exercise and its benefits for both physical and mental health.  Type 2 Diabetes Mellitus Type 2 diabetes is well-managed with current treatment. Blood sugar levels are stable, with recent readings in the 90s and occasional spikes to 115. A1c has improved significantly. Mounjaro  is contributing to better glycemic control. Prescription management was discussed for convenience. Is rarely if ever needing insulin  coverage at this point.  Lab Results  Component Value Date  HGBA1C 6.1 (H) 01/27/2024   HGBA1C 6.4 (H) 09/15/2023   HGBA1C 6.8 (H) 06/16/2023   Lab Results  Component Value Date   MICROALBUR 3.6 (H) 10/03/2021   LDLCALC 44 09/15/2023   CREATININE 1.30 (H) 01/27/2024   He is working  on  nutrition plan to decrease simple carbohydrates, increase lean proteins and exercise to promote weight loss and improve glycemic control . - Continue current diabetes management plan with Mounjaro . - Monitor blood sugar levels regularly. - Ensure prescription for Mounjaro  is sent to [facility] for convenience.   Hypertension Hypertension improved, asymptomatic, reasonably well controlled, and no significant medication side effects noted.  Medication(s): carvedilol  12.5 mg BID   Demadex  10 mg BID for volume/fluid management Last creatinine slightly up, but GFR~ stable. Up in water weight today compared to last visit, but endorses eating off plan/likely salty foods over Easter holiday weekend.  No other signs or symptoms of volume overload today. Water weight today similar to water weight prior to the last visit which he was fasting for labs, so may have been somewhat "dry " from typical. Monitor closely   BP Readings from Last 3 Encounters:  02/24/24 109/71  01/27/24 (!) 142/89  01/24/24 (!) 154/86   Lab Results  Component Value Date   CREATININE 1.30 (H) 01/27/2024   CREATININE 1.09 09/15/2023   CREATININE 1.05 06/16/2023   Lab Results  Component Value Date   GFR 79.16 10/03/2021   GFR 80.21 06/16/2019   GFR 92.95 06/10/2018    Plan: Continue all antihypertensives at current dosages. Continue to work on nutrition plan to promote weight loss and improve BP control.  Monitor closely on Mounjaro  for hypotension. No current issues.   Tobacco Use Continued tobacco use acknowledged. He expresses a desire to quit smoking, recognizing its negative impact on lung and heart health. - Encourage smoking cessation and discuss potential strategies in future visits.  Vitals Temp: 98.2 F (36.8 C) BP: 109/71 Pulse Rate: 96 SpO2: 97 %   Anthropometric Measurements Height: 6\' 3"  (1.905 m) Weight: (!) 336 lb (152.4 kg) BMI (Calculated): 42 Weight at Last Visit: 332 lb Weight Lost Since  Last Visit: 0 Weight Gained Since Last Visit: 4 lb Starting Weight: 383 lb Total Weight Loss (lbs): 47 lb (21.3 kg) Peak Weight: 383 lb   Body Composition  Body Fat %: 43.4 % Fat Mass (lbs): 146.2 lbs Muscle Mass (lbs): 181.4 lbs Visceral Fat Rating : 27   Other Clinical Data Fasting: no Labs: no Today's Visit #: 18 Starting Date: 07/31/22     ASSESSMENT AND PLAN:  Diet: Luis [name] is currently in the action stage of change. As such, his goal is to get back to weight loss efforts. He has agreed to Category 3 Plan.  Exercise: Luis [name] has been instructed to try a geriatric exercise plan and that some exercise is better than none for weight loss and overall health benefits.Discussed [facility] Right start program and [place] [employer] [facility] [employer] programs to get into regular exercise routine.    Behavior Modification:  We discussed the following Behavioral Modification Strategies today: increasing lean protein intake, decreasing simple carbohydrates, increasing vegetables, increase H2O intake, increase high fiber foods, no skipping meals, meal planning and cooking strategies, holiday eating strategies, celebration eating strategies, avoiding temptations, and planning for success. We discussed various medication options to help Luis [name] with his weight loss efforts and we both agreed to continue current program and continue to work on nutritional and behavioral strategies to promote  weight loss.  .  Return in about 4 weeks (around 03/23/2024).Luis [name] He was informed of the importance of frequent follow up visits to maximize his success with intensive lifestyle modifications for his multiple health conditions.  Attestation Statements:   Reviewed by clinician on day of visit: allergies, medications, problem list, medical history, surgical history, family history, social history, and previous encounter notes.   Time spent on visit including pre-visit chart review and post-visit care and charting  was 25 minutes.    Luis Dolney, PA-C

## 2024-03-02 ENCOUNTER — Other Ambulatory Visit: Payer: Self-pay | Admitting: *Deleted

## 2024-03-02 ENCOUNTER — Telehealth: Payer: Self-pay | Admitting: *Deleted

## 2024-03-02 ENCOUNTER — Other Ambulatory Visit: Payer: Self-pay | Admitting: Family Medicine

## 2024-03-02 DIAGNOSIS — E1159 Type 2 diabetes mellitus with other circulatory complications: Secondary | ICD-10-CM

## 2024-03-02 DIAGNOSIS — Z794 Long term (current) use of insulin: Secondary | ICD-10-CM

## 2024-03-02 DIAGNOSIS — E876 Hypokalemia: Secondary | ICD-10-CM

## 2024-03-02 MED ORDER — TORSEMIDE 10 MG PO TABS: 10.0000 mg | ORAL_TABLET | Freq: Two times a day (BID) | ORAL | 0 refills | Status: DC

## 2024-03-02 MED ORDER — SIMVASTATIN 20 MG PO TABS: 20.0000 mg | ORAL_TABLET | Freq: Every day | ORAL | 0 refills | Status: DC

## 2024-03-02 MED ORDER — POTASSIUM CHLORIDE CRYS ER 20 MEQ PO TBCR: EXTENDED_RELEASE_TABLET | ORAL | 0 refills | Status: DC

## 2024-03-02 NOTE — Telephone Encounter (Signed)
 Copied from CRM [id number]. Topic: Clinical - Medication Refill >> Mar 02, 2024  9:33 AM Orien Bird wrote: Most Recent Primary Care Visit:  Provider: Rodney Clamp  Department: [facility]  Visit Type: OFFICE VISIT  Date: 01/09/2023  Medication: potassium chloride  SA (KLOR-CON  M) 20 MEQ tablet, carvedilol  (COREG ) 12.5 MG tablet, torsemide  (DEMADEX ) 10 MG tablet, and simvastatin  (ZOCOR ) 20 MG tablet  Has the patient contacted their pharmacy? Yes (Agent: If no, request that the patient contact the pharmacy for the refill. If patient does not wish to contact the pharmacy document the reason why and proceed with request.) (Agent: If yes, when and what did the pharmacy advise?)  Is this the correct pharmacy for this prescription? Yes If no, delete pharmacy and type the correct one.  This is the patient's preferred pharmacy:  [id number] [name], [place] - [address] AT [facility] OF [facility] [name] [place] [address] Phone: [phone] Fax: [phone]   Has the prescription been filled recently? No  Is the patient out of the medication? Yes  Has the patient been seen for an appointment in the last year OR does the patient have an upcoming appointment? Yes  Can we respond through MyChart? No  Agent: Please be advised that Rx refills may take up to 3 business days. We ask that you follow-up with your pharmacy.

## 2024-03-02 NOTE — Telephone Encounter (Signed)
 Copied from CRM [id number]. Topic: Clinical - Medication Refill >> Mar 02, 2024  9:33 AM Luis [name] wrote: Most Recent Primary Care Visit:  Provider: Rodney Clamp  Department: [facility]  Visit Type: OFFICE VISIT  Date: 01/09/2023  Medication: potassium chloride  SA (KLOR-CON  M) 20 MEQ tablet, carvedilol  (COREG ) 12.5 MG tablet, torsemide  (DEMADEX ) 10 MG tablet, and simvastatin  (ZOCOR ) 20 MG tablet  Has the patient contacted their pharmacy? Yes (Agent: If no, request that the patient contact the pharmacy for the refill. If patient does not wish to contact the pharmacy document the reason why and proceed with request.) (Agent: If yes, when and what did the pharmacy advise?)  Is this the correct pharmacy for this prescription? Yes If no, delete pharmacy and type the correct one.  This is the patient's preferred pharmacy:  [id number] [name], [place] - [address] AT [facility] OF [facility] [name] [place] [address] Phone: [phone] Fax: [phone]   Has the prescription been filled recently? No  Is the patient out of the medication? Yes  Has the patient been seen for an appointment in the last year OR does the patient have an upcoming appointment? Yes  Can we respond through MyChart? No  Agent: Please be advised that Rx refills may take up to 3 business days. We ask that you follow-up with your pharmacy. >> Mar 02, 2024 12:44 PM Luis [name] wrote: Patient was calling back to follow up on refill request advised patient he needed to schedule an appointment per notes patient scheduled appointment for 5/9 at 10:20 am, patient states he needs his medication sent in today.Pleae reach out to him  Luis [name]  [phone]      Rx Zocor  and Potassium send to pharmacy  Advise to call [name], Evangelina Hilt, PA- office for refills on other medication requested advise need anOV for future refills  Luis [name],RMA

## 2024-03-05 ENCOUNTER — Other Ambulatory Visit (HOSPITAL_COMMUNITY): Payer: Self-pay

## 2024-03-10 ENCOUNTER — Encounter: Payer: Self-pay | Admitting: Family Medicine

## 2024-03-10 ENCOUNTER — Ambulatory Visit (INDEPENDENT_AMBULATORY_CARE_PROVIDER_SITE_OTHER): Admitting: Family Medicine

## 2024-03-10 VITALS — BP 114/69 | HR 80 | Temp 97.0°F | Ht 75.0 in | Wt 337.8 lb

## 2024-03-10 DIAGNOSIS — G4733 Obstructive sleep apnea (adult) (pediatric): Secondary | ICD-10-CM

## 2024-03-10 DIAGNOSIS — E1165 Type 2 diabetes mellitus with hyperglycemia: Secondary | ICD-10-CM

## 2024-03-10 DIAGNOSIS — E1159 Type 2 diabetes mellitus with other circulatory complications: Secondary | ICD-10-CM | POA: Diagnosis not present

## 2024-03-10 DIAGNOSIS — I152 Hypertension secondary to endocrine disorders: Secondary | ICD-10-CM

## 2024-03-10 DIAGNOSIS — Z794 Long term (current) use of insulin: Secondary | ICD-10-CM

## 2024-03-10 DIAGNOSIS — R600 Localized edema: Secondary | ICD-10-CM | POA: Diagnosis not present

## 2024-03-10 MED ORDER — TORSEMIDE 20 MG PO TABS
20.0000 mg | ORAL_TABLET | Freq: Two times a day (BID) | ORAL | 5 refills | Status: DC
Start: 2024-03-10 — End: 2024-06-06

## 2024-03-10 NOTE — Assessment & Plan Note (Signed)
 Blood pressure at goal today on carvedilol  25 mg twice daily.

## 2024-03-10 NOTE — Assessment & Plan Note (Addendum)
 A1c's have improved traumatically since being on Mounjaro .  He is also down about 50 pounds since our last visit.  Congratulated patient on his weight loss.  He has upcoming appointment with medical weight management and they will recheck A1c at that time.

## 2024-03-10 NOTE — Progress Notes (Signed)
 Luis [name] is a 58 y.o. male who presents today for an office visit.  Assessment/Plan:  Chronic Problems Addressed Today: Bilateral edema of lower extremity Lower extremity edema is a chronic issue that has worsened quite a bit over the last couple of months.  His edema is likely multifactorial in setting of venous insufficiency and chronic diastolic CHF that is followed by cardiology.  Overall does not appear volume overloaded and has no red flag signs or symptoms.  He has had quite a bit of sodium intake the last few months including canned foods which is likely the main contributor at this point.  We discussed importance of low-sodium diet and gave examples of low-sodium alternatives.  We also did discuss conservative measures including leg elevation and compression stockings.  We will increase his torsemide  to 20 mg twice daily.  He will follow-up with us  in a couple of weeks via MyChart.  He is scheduled to have blood work in a couple of weeks as well.  If edema does not improve with this would consider referral to vascular or have him follow-up with cardiology for ongoing management.  Diabetes (HCC) A1c's have improved traumatically since being on Mounjaro .  He is also down about 50 pounds since our last visit.  Congratulated patient on his weight loss.  He has upcoming appointment with medical weight management and they will recheck A1c at that time.  Hypertension associated with diabetes (HCC) Blood pressure at goal today on carvedilol  25 mg twice daily.  OSA on CPAP He has now on CPAP and doing very well with this.     Subjective:  HPI:  See A/P for status of chronic conditions.  Patient is here today for follow-up.  I last saw him over a year ago.  Since our last visit he has been following with medical weight management for management for his weight and diabetes.  HE has been started on Mounjaro  since our last visit and he is now doing well.  He is down about 50 pounds  over the last year or so.  His blood sugars been well-controlled.  Primary concern today is lower extremity edema.  This has been a longstanding issue that seems to have worsened in the last few months.  He did fall and is up in the emergency room due to this a couple of months ago.  Injured his right leg but this has since recovered.  He had plain films at that time which were negative.  Since then he still has ongoing swelling in both of his legs.  He had this happen before as well and then he doubled up on his dose of torsemide  per cardiology recommendations which helped with his symptoms.  No reported chest pain or shortness of breath.  No orthopnea.  Does admit to not paying attention to sodium intake and does admit to eating quite a bit of canned vegetables including green beans.        Objective:  Physical Exam: BP 114/69   Pulse 80   Temp (!) 97 F (36.1 C) (Temporal)   Ht 6\' 3"  (1.905 m)   Wt (!) 337 lb 12.8 oz (153.2 kg)   SpO2 96%   BMI 42.22 kg/m   Gen: No acute distress, resting comfortably CV: Regular rate and rhythm with no murmurs appreciated Pulm: Normal work of breathing, clear to auscultation bilaterally with no crackles, wheezes, or rhonchi MUSCULOSKELETAL: 3+ pitting edema to knees bilaterally. Neuro: Grossly normal, moves all extremities Psych:  Normal affect and thought content      Drayven Marchena M. Daneil Dunker, MD 03/10/2024 1:58 PM

## 2024-03-10 NOTE — Assessment & Plan Note (Signed)
 Lower extremity edema is a chronic issue that has worsened quite a bit over the last couple of months.  His edema is likely multifactorial in setting of venous insufficiency and chronic diastolic CHF that is followed by cardiology.  Overall does not appear volume overloaded and has no red flag signs or symptoms.  He has had quite a bit of sodium intake the last few months including canned foods which is likely the main contributor at this point.  We discussed importance of low-sodium diet and gave examples of low-sodium alternatives.  We also did discuss conservative measures including leg elevation and compression stockings.  We will increase his torsemide  to 20 mg twice daily.  He will follow-up with us  in a couple of weeks via MyChart.  He is scheduled to have blood work in a couple of weeks as well.  If edema does not improve with this would consider referral to vascular or have him follow-up with cardiology for ongoing management.

## 2024-03-10 NOTE — Assessment & Plan Note (Signed)
 He has now on CPAP and doing very well with this.

## 2024-03-10 NOTE — Patient Instructions (Addendum)
 It was very nice to see you today!  Please continue to work on cutting down on your sodium.  Keep your legs elevated.  We will increase your dose of torsemide .  Let me know how this is working for you in a few weeks.  Will see you back in a few months for your physical.  Please come back sooner if needed.  Return in about 3 months (around 06/10/2024) for Annual Physical.   Take care, Dr Daneil Dunker  PLEASE NOTE:  If you had any lab tests, please let us  know if you have not heard back within a few days. You may see your results on mychart before we have a chance to review them but we will give you a call once they are reviewed by us .   If we ordered any referrals today, please let us  know if you have not heard from their office within the next week.   If you had any urgent prescriptions sent in today, please check with the pharmacy within an hour of our visit to make sure the prescription was transmitted appropriately.   Please try these tips to maintain a healthy lifestyle:  Eat at least 3 REAL meals and 1-2 snacks per day.  Aim for no more than 5 hours between eating.  If you eat breakfast, please do so within one hour of getting up.   Each meal should contain half fruits/vegetables, one quarter protein, and one quarter carbs (no bigger than a computer mouse)  Cut down on sweet beverages. This includes juice, soda, and sweet tea.   Drink at least 1 glass of water with each meal and aim for at least 8 glasses per day  Exercise at least 150 minutes every week.

## 2024-03-11 ENCOUNTER — Ambulatory Visit: Admitting: Family Medicine

## 2024-03-14 DIAGNOSIS — G4733 Obstructive sleep apnea (adult) (pediatric): Secondary | ICD-10-CM | POA: Diagnosis not present

## 2024-03-17 ENCOUNTER — Other Ambulatory Visit (HOSPITAL_COMMUNITY): Payer: Self-pay

## 2024-03-22 ENCOUNTER — Ambulatory Visit (INDEPENDENT_AMBULATORY_CARE_PROVIDER_SITE_OTHER): Admitting: Physician Assistant

## 2024-03-22 ENCOUNTER — Other Ambulatory Visit (HOSPITAL_COMMUNITY): Payer: Self-pay

## 2024-03-22 ENCOUNTER — Encounter (INDEPENDENT_AMBULATORY_CARE_PROVIDER_SITE_OTHER): Payer: Self-pay | Admitting: Physician Assistant

## 2024-03-22 VITALS — BP 118/77 | HR 95 | Temp 98.0°F | Ht 75.0 in | Wt 332.0 lb

## 2024-03-22 DIAGNOSIS — E538 Deficiency of other specified B group vitamins: Secondary | ICD-10-CM | POA: Diagnosis not present

## 2024-03-22 DIAGNOSIS — I152 Hypertension secondary to endocrine disorders: Secondary | ICD-10-CM | POA: Diagnosis not present

## 2024-03-22 DIAGNOSIS — Z794 Long term (current) use of insulin: Secondary | ICD-10-CM

## 2024-03-22 DIAGNOSIS — Z6841 Body Mass Index (BMI) 40.0 and over, adult: Secondary | ICD-10-CM

## 2024-03-22 DIAGNOSIS — E785 Hyperlipidemia, unspecified: Secondary | ICD-10-CM | POA: Diagnosis not present

## 2024-03-22 DIAGNOSIS — E559 Vitamin D deficiency, unspecified: Secondary | ICD-10-CM

## 2024-03-22 DIAGNOSIS — E1165 Type 2 diabetes mellitus with hyperglycemia: Secondary | ICD-10-CM

## 2024-03-22 DIAGNOSIS — R600 Localized edema: Secondary | ICD-10-CM | POA: Diagnosis not present

## 2024-03-22 DIAGNOSIS — E1169 Type 2 diabetes mellitus with other specified complication: Secondary | ICD-10-CM | POA: Diagnosis not present

## 2024-03-22 DIAGNOSIS — E1159 Type 2 diabetes mellitus with other circulatory complications: Secondary | ICD-10-CM

## 2024-03-22 DIAGNOSIS — G4733 Obstructive sleep apnea (adult) (pediatric): Secondary | ICD-10-CM

## 2024-03-22 MED ORDER — TIRZEPATIDE 10 MG/0.5ML ~~LOC~~ SOAJ
10.0000 mg | SUBCUTANEOUS | 0 refills | Status: DC
  Filled 2024-03-22: qty 2, 28d supply, fill #0

## 2024-03-22 NOTE — Progress Notes (Signed)
 SUBJECTIVE: Discussed the use of AI scribe software for clinical note transcription with the patient, who gave verbal consent to proceed.  Chief Complaint: Obesity  Interim History: He is down 4 lbs since last visit Down 51 lbs overall TBW loss of 13.3%  Luis [name] is here to discuss his progress with his obesity treatment plan. He is on the Category 3 Plan and Category 4 Plan and states he is following his eating plan approximately 25 % of the time. He states he is not exercising 0 minutes 0 times per week.  Luis Hane "[name]" is a 58 year old male who presents for follow-up of his obesity treatment plan.  He has experienced a recent lapse in dietary habits, consuming sodas and candy bars due to emotional reasons related to his late mother's birthday. He has since resumed healthier eating habits, including sugar-free juices and melons, and enjoys honeydew melons, and cantaloupes.  His energy levels are good, which he attributes to the use of his CPAP machine. He sleeps 6-7 hours per night, an improvement from previous patterns.  Regarding diabetes management, his blood sugar levels have stabilized, often reading in the 113s to 90s. He takes insulin  less frequently, about one shot every three to four days, usually around 24 units, and uses Mounjaro  10 mg once weekly. He is hopeful to come off all insulin  if today's A1c result is improved .  He has not been engaging in regular exercise but has been more active around his house and during outings with his aunt. He acknowledges the need to increase physical activity, including walking to his mailbox more frequently, which he currently does infrequently due to discomfort from swelling in his legs.  His torsemide  dosage was increased from 10 mg to 20 mg twice daily, and the swelling in his legs is better than it was after he fell, though not completely resolved. He experiences discomfort associated with walking due to this swelling and mentions  the use of compression socks.  He spends time with his aunt and engages in activities such as shopping, which involves some walking. No current employment or other social activities are mentioned.  No depression or shortness of breath. He reports discomfort in his legs due to swelling. Fasting labs obtained today.  The patient was informed we would discuss the lab results at the next visit unless there is a critical issue that needs to be addressed sooner. The patient agreed to keep the next visit at the agreed upon time to discuss these results.   OBJECTIVE: Visit Diagnoses: Problem List Items Addressed This Visit     OSA on CPAP   Bilateral edema of lower extremity   Morbid obesity (HCC)   Relevant Medications   tirzepatide  (MOUNJARO ) 10 MG/0.5ML Pen   Vitamin D  deficiency   Relevant Orders   VITAMIN D  25 Hydroxy (Vit-D Deficiency, Fractures)   B12 deficiency   Relevant Orders   Vitamin B12   CBC with Differential/Platelet   BMI 40.0-44.9, adult (HCC)   Relevant Medications   tirzepatide  (MOUNJARO ) 10 MG/0.5ML Pen   Hypertension associated with diabetes (HCC) (Chronic)   Relevant Medications   tirzepatide  (MOUNJARO ) 10 MG/0.5ML Pen   Hyperlipidemia associated with type 2 diabetes mellitus (HCC) (Chronic)   Relevant Medications   tirzepatide  (MOUNJARO ) 10 MG/0.5ML Pen   Other Relevant Orders   Lipid Panel With LDL/HDL Ratio   Diabetes (HCC) - Primary (Chronic)   Relevant Medications   tirzepatide  (MOUNJARO ) 10 MG/0.5ML Pen   Other  Relevant Orders   CMP14+EGFR   Hemoglobin A1c  Obesity Obesity management is ongoing. Down 51 lbs overall, TBW loss of 13.3% !!! Recent dietary lapse with sodas and candy bars, but he has returned to healthier eating habits. Weight loss of nearly ten pounds of adipose tissue with increased muscle mass over the past month. Current body fat percentage is 41%. Physical activity is crucial for weight loss and edema reduction. - Encourage increased  physical activity, including walking to the mailbox once a week and using a peddler. - Continue current nutrition plan.   Chronic lower extremity edema Chronic leg edema improved with increased torsemide  dosage from 10 mg to 20 mg twice daily. Compression socks recommended to manage edema and prevent complications such as skin breakdown and infections, especially with type 2 diabetes. Physical activity promotes venous return and reduces edema. - Continue torsemide  20 mg twice daily. - Wear compression socks regularly. - Encourage physical activity to help mobilize fluid and reduce edema. Recheck labs today.   Type 2 diabetes mellitus Type 2 diabetes is better controlled with current medication regimen. Blood glucose levels stabilized, with infrequent insulin  need. Mounjaro  is effective in maintaining glucose levels. Recent medication adherence lapse led to temporary glucose increase, resolved after resuming medication. Lab Results  Component Value Date   HGBA1C 6.1 (H) 01/27/2024   HGBA1C 6.4 (H) 09/15/2023   HGBA1C 6.8 (H) 06/16/2023   Lab Results  Component Value Date   MICROALBUR 3.6 (H) 10/03/2021   LDLCALC 44 09/15/2023   CREATININE 1.30 (H) 01/27/2024   - Continue Mounjaro  10 mg weekly as prescribed. - Monitor blood glucose levels regularly. - Encourage adherence to medication / nutrition regimen.  He is working  on Engineer, technical sales to decrease simple carbohydrates, increase lean proteins and exercise to promote weight loss and improve glycemic control . Recheck labs today and hopefully stop Insulin  prescribed by PCP if able if A1c continued to improve.  Meds ordered this encounter  Medications   tirzepatide  (MOUNJARO ) 10 MG/0.5ML Pen    Sig: Inject 10 mg into the skin once a week.    Dispense:  2 mL    Refill:  0    Hypertension Hypertension asymptomatic, reasonably well controlled, and no significant medication side effects noted.  Medication(s):  carvedilol  12.5 mg BID                          Demadex  20 mg BID for volume/fluid management  BP Readings from Last 3 Encounters:  03/22/24 118/77  03/10/24 114/69  02/24/24 109/71   Lab Results  Component Value Date   CREATININE 1.30 (H) 01/27/2024   CREATININE 1.09 09/15/2023   CREATININE 1.05 06/16/2023   Lab Results  Component Value Date   GFR 79.16 10/03/2021   GFR 80.21 06/16/2019   GFR 92.95 06/10/2018    Plan: Continue all antihypertensives at current dosages. Continue to work on nutrition plan to promote weight loss and improve BP control.  Monitor for hypotension with continued weight loss and use of Mounjaro .   Hyperlipidemia LDL is at goal. Medication(s):Zocor  20 mg daily. No side effects Cardiovascular risk factors: advanced age (older than 30 for men, 52 for women), diabetes mellitus, dyslipidemia, family history of premature cardiovascular disease, hypertension, male gender, obesity (BMI >= 30 kg/m2), sedentary lifestyle, and smoking/ tobacco exposure  Lab Results  Component Value Date   CHOL 107 09/15/2023   HDL 39 (L) 09/15/2023   LDLCALC 44 09/15/2023  TRIG 135 09/15/2023   CHOLHDL 4.5 07/31/2022   CHOLHDL 4 10/03/2021   CHOLHDL 4.1 08/23/2020   Lab Results  Component Value Date   ALT 21 01/27/2024   AST 18 01/27/2024   ALKPHOS 142 (H) 01/27/2024   BILITOT 0.7 01/27/2024   The ASCVD Risk score (Arnett DK, et al., 2019) failed to calculate for the following reasons:   The valid total cholesterol range is 130 to 320 mg/dL  Plan: Continue statin. Continue Mounjaro  Continue to work on nutrition plan -decreasing simple carbohydrates, increasing lean proteins, decreasing saturated fats and cholesterol , avoiding trans fats and exercise as able to promote weight loss, improve lipids and decrease cardiovascular risks.   Vitamin D  Deficiency Vitamin D  is at goal of 50.  Most recent vitamin D  level was 84.0. He is on OTC vitamin D3 2000 IU daily. Lab Results  Component  Value Date   VD25OH 84.0 01/27/2024   VD25OH 41.0 09/15/2023   VD25OH 21.8 (L) 06/16/2023    Plan:  Low vitamin D  levels can be associated with adiposity and may result in leptin resistance and weight gain. Also associated with fatigue.  Currently on vitamin D  supplementation without any adverse effects such as nausea, vomiting or muscle weakness.  May need to decrease dose of OTC vitamin D .  Recheck vitamin D  levels today.    Low B 12 Notes energy levels improved with continued weight loss and overall improvement in health status.  Recheck B 12 levels today.    Obstructive sleep apnea Obstructive sleep apnea is managed with CPAP therapy which he is using consistently, improving energy levels and sleep quality. - Continue CPAP therapy.  Vitals Temp: 98 F (36.7 C) BP: 118/77 Pulse Rate: 95 SpO2: 99 %   Anthropometric Measurements Height: 6\' 3"  (1.905 m) Weight: (!) 332 lb (150.6 kg) BMI (Calculated): 41.5 Weight at Last Visit: 336 lb Weight Lost Since Last Visit: 4 lb Weight Gained Since Last Visit: 0 Starting Weight: 383 lb Total Weight Loss (lbs): 51 lb (23.1 kg) Peak Weight: 383 lb   Body Composition  Body Fat %: 41 % Fat Mass (lbs): 136.4 lbs Muscle Mass (lbs): 1863.6 lbs Total Body Water (lbs): 159.2 lbs Visceral Fat Rating : 26   Other Clinical Data Fasting: Yes Labs: Yes Today's Visit #: 19 Starting Date: 07/31/22     ASSESSMENT AND PLAN:  Diet: Luis [name] is currently in the action stage of change. As such, his goal is to continue with weight loss efforts. He has agreed to Category 3 Plan.  Exercise: Luis [name] has been instructed to try a geriatric exercise plan and that some exercise is better than none for weight loss and overall health benefits.   Behavior Modification:  We discussed the following Behavioral Modification Strategies today: increasing lean protein intake, decreasing simple carbohydrates, increasing vegetables, increase H2O  intake, increase high fiber foods, no skipping meals, meal planning and cooking strategies, keeping healthy foods in the home, better snacking choices, avoiding temptations, and planning for success. We discussed various medication options to help Luis [name] with his weight loss efforts and we both agreed to continue current treatment plan.  Return in about 4 weeks (around 04/19/2024).Luis [name] He was informed of the importance of frequent follow up visits to maximize his success with intensive lifestyle modifications for his multiple health conditions.  Attestation Statements:   Reviewed by clinician on day of visit: allergies, medications, problem list, medical history, surgical history, family history, social history, and previous encounter notes.  Time spent on visit including pre-visit chart review and post-visit care and charting was 39 minutes.    Brianah Hopson, PA-C

## 2024-03-23 LAB — CBC WITH DIFFERENTIAL/PLATELET
Basophils Absolute: 0 10*3/uL (ref 0.0–0.2)
Basos: 1 %
EOS (ABSOLUTE): 0.2 10*3/uL (ref 0.0–0.4)
Eos: 3 %
Hematocrit: 46.3 % (ref 37.5–51.0)
Hemoglobin: 15.2 g/dL (ref 13.0–17.7)
Immature Grans (Abs): 0 10*3/uL (ref 0.0–0.1)
Immature Granulocytes: 0 %
Lymphocytes Absolute: 2.3 10*3/uL (ref 0.7–3.1)
Lymphs: 35 %
MCH: 33.2 pg — ABNORMAL HIGH (ref 26.6–33.0)
MCHC: 32.8 g/dL (ref 31.5–35.7)
MCV: 101 fL — ABNORMAL HIGH (ref 79–97)
Monocytes Absolute: 0.5 10*3/uL (ref 0.1–0.9)
Monocytes: 8 %
Neutrophils Absolute: 3.5 10*3/uL (ref 1.4–7.0)
Neutrophils: 53 %
Platelets: 273 10*3/uL (ref 150–450)
RBC: 4.58 x10E6/uL (ref 4.14–5.80)
RDW: 13.3 % (ref 11.6–15.4)
WBC: 6.5 10*3/uL (ref 3.4–10.8)

## 2024-03-23 LAB — VITAMIN B12: Vitamin B-12: 464 pg/mL (ref 232–1245)

## 2024-03-23 LAB — HEMOGLOBIN A1C
Est. average glucose Bld gHb Est-mCnc: 131 mg/dL
Hgb A1c MFr Bld: 6.2 % — ABNORMAL HIGH (ref 4.8–5.6)

## 2024-03-23 LAB — LIPID PANEL WITH LDL/HDL RATIO
Cholesterol, Total: 121 mg/dL (ref 100–199)
HDL: 32 mg/dL — ABNORMAL LOW (ref >39)
LDL Chol Calc (NIH): 63 mg/dL (ref 0–99)
LDL/HDL Ratio: 2 ratio (ref 0.0–3.6)
Triglycerides: 146 mg/dL (ref 0–149)
VLDL Cholesterol Cal: 26 mg/dL (ref 5–40)

## 2024-03-23 LAB — VITAMIN D 25 HYDROXY (VIT D DEFICIENCY, FRACTURES): Vit D, 25-Hydroxy: 73.9 ng/mL (ref 30.0–100.0)

## 2024-03-23 LAB — CMP14+EGFR
ALT: 25 IU/L (ref 0–44)
AST: 21 IU/L (ref 0–40)
Albumin: 4 g/dL (ref 3.8–4.9)
Alkaline Phosphatase: 144 IU/L — ABNORMAL HIGH (ref 44–121)
BUN/Creatinine Ratio: 12 (ref 9–20)
BUN: 16 mg/dL (ref 6–24)
Bilirubin Total: 0.8 mg/dL (ref 0.0–1.2)
CO2: 20 mmol/L (ref 20–29)
Calcium: 9 mg/dL (ref 8.7–10.2)
Chloride: 103 mmol/L (ref 96–106)
Creatinine, Ser: 1.34 mg/dL — ABNORMAL HIGH (ref 0.76–1.27)
Globulin, Total: 3 g/dL (ref 1.5–4.5)
Glucose: 127 mg/dL — ABNORMAL HIGH (ref 70–99)
Potassium: 4.3 mmol/L (ref 3.5–5.2)
Sodium: 142 mmol/L (ref 134–144)
Total Protein: 7 g/dL (ref 6.0–8.5)
eGFR: 62 mL/min/{1.73_m2} (ref >59)

## 2024-03-30 ENCOUNTER — Other Ambulatory Visit: Payer: Self-pay | Admitting: *Deleted

## 2024-03-30 MED ORDER — SIMVASTATIN 20 MG PO TABS: 20.0000 mg | ORAL_TABLET | Freq: Every day | ORAL | 0 refills | Status: DC

## 2024-04-01 ENCOUNTER — Ambulatory Visit: Payer: BC Managed Care – PPO | Admitting: Podiatry

## 2024-04-04 ENCOUNTER — Other Ambulatory Visit: Payer: Self-pay | Admitting: *Deleted

## 2024-04-04 MED ORDER — SIMVASTATIN 20 MG PO TABS: 20.0000 mg | ORAL_TABLET | Freq: Every day | ORAL | 1 refills | Status: DC

## 2024-04-08 ENCOUNTER — Encounter: Payer: Self-pay | Admitting: Podiatry

## 2024-04-08 ENCOUNTER — Ambulatory Visit (INDEPENDENT_AMBULATORY_CARE_PROVIDER_SITE_OTHER): Admitting: Podiatry

## 2024-04-08 DIAGNOSIS — E1165 Type 2 diabetes mellitus with hyperglycemia: Secondary | ICD-10-CM

## 2024-04-08 DIAGNOSIS — M79675 Pain in left toe(s): Secondary | ICD-10-CM

## 2024-04-08 DIAGNOSIS — M79674 Pain in right toe(s): Secondary | ICD-10-CM | POA: Diagnosis not present

## 2024-04-08 DIAGNOSIS — B351 Tinea unguium: Secondary | ICD-10-CM | POA: Diagnosis not present

## 2024-04-08 DIAGNOSIS — Z794 Long term (current) use of insulin: Secondary | ICD-10-CM

## 2024-04-08 NOTE — Progress Notes (Signed)
 This patient returns to my office for at risk foot care.  This patient requires this care by a professional since this patient will be at risk due to having diabetes and venous stasis.  This patient is unable to cut nails himself since the patient cannot reach his nails.These nails are painful walking and wearing shoes.  This patient presents for at risk foot care today. ? ?General Appearance  Alert, conversant and in no acute stress. ? ?Vascular  Dorsalis pedis and posterior tibial  pulses are not palpable  bilaterally due to severe swelling..  Capillary return is within normal limits  bilaterally. Temperature is within normal limits  bilaterally. ? ?Neurologic  Senn-Weinstein monofilament wire test within normal limits  bilaterally. Muscle power within normal limits bilaterally. ? ?Nails Thick disfigured discolored nails with subungual debris  from hallux to fifth toes bilaterally. No evidence of bacterial infection or drainage bilaterally. ? ?Orthopedic  No limitations of motion  feet .  No crepitus or effusions noted.  No bony pathology or digital deformities noted. ? ?Skin  normotropic skin with no porokeratosis noted bilaterally.  No signs of infections or ulcers noted.    ? ?Onychomycosis  Pain in right toes  Pain in left toes  Swelling legs/feet ? ?Consent was obtained for treatment procedures.   Mechanical debridement of nails 1-5  bilaterally performed with a nail nipper.  Filed with dremel without incident.  ? ? ?Return office visit   3 months                   Told patient to return for periodic foot care and evaluation due to potential at risk complications. ? ? ?Helane Gunther DPM  ?

## 2024-04-13 ENCOUNTER — Other Ambulatory Visit: Payer: Self-pay | Admitting: *Deleted

## 2024-04-13 DIAGNOSIS — E876 Hypokalemia: Secondary | ICD-10-CM

## 2024-04-13 MED ORDER — POTASSIUM CHLORIDE CRYS ER 20 MEQ PO TBCR: EXTENDED_RELEASE_TABLET | ORAL | 0 refills | Status: DC

## 2024-04-14 DIAGNOSIS — G4733 Obstructive sleep apnea (adult) (pediatric): Secondary | ICD-10-CM | POA: Diagnosis not present

## 2024-04-18 ENCOUNTER — Encounter (INDEPENDENT_AMBULATORY_CARE_PROVIDER_SITE_OTHER): Payer: Self-pay | Admitting: Physician Assistant

## 2024-04-18 ENCOUNTER — Encounter: Payer: Self-pay | Admitting: Family Medicine

## 2024-04-18 NOTE — Telephone Encounter (Signed)
**Note De-identified  [place] Obfuscation** Please advise 

## 2024-04-18 NOTE — Telephone Encounter (Signed)
 Recommend that he pursue Family Medical Leave Act [facility]) paperwork. He have drop it off here or have his HR fax it to us .  Jinny Mounts. Daneil Dunker, MD 04/18/2024 1:35 PM

## 2024-04-18 NOTE — Progress Notes (Unsigned)
 SUBJECTIVE: Discussed the use of AI scribe software for clinical note transcription with the patient, who gave verbal consent to proceed.  Chief Complaint: Obesity  Interim History: He is down 5 lbs since last visit Down 56 lbs overall TBW Loss of 14.6%  Luis [name] is here to discuss his progress with his obesity treatment plan. He is on the Category 4 Plan and states he is following his eating plan approximately 95 % of the time. He states he is not exercising. Luis [name] is a 58 year old male with obesity who presents for follow-up of his obesity treatment plan.  He has experienced significant weight loss, reporting a decrease of 56 pounds, bringing his current weight to 327 pounds. He attributes his weight loss to adherence to his meal plan, which includes melons, fruits, and low-calorie bread as well as lean protein. He is considering incorporating more physical activity and mentions having to buy smaller clothes due to his weight loss.  He has type two diabetes and is managing it with Mounjaro , which effectively maintains his blood sugar levels around 120 mg/dL. When he briefly stopped Mounjaro , his blood sugar levels increased to the 140s and 150s. He does not require insulin  currently based on his improved blood sugars and reports no complications from Mounjaro , such as constipation or diarrhea, and appreciates its appetite suppression effects. Denies mass in neck, dysphagia, dyspepsia, persistent hoarseness, abdominal pain, or N/V/Constipation or diarrhea. Has annual eye exam. Mood is stable.    He has chronic heart failure and is on torsemide  to manage fluid retention. There is some improvement in leg swelling, but his ankles remain swollen. He experiences frequent urination due to the medication and acknowledges the need to incorporate more physical activity to help with this issue.  He has hyperlipidemia and is on a statin. He reports no muscle soreness or weakness, which  can be side effects of statins. He is also taking vitamin D  supplements and multivitamins.  He drinks alcohol infrequently, about every two weeks, but tends to consume a large amount when he does. He does not regularly take Tylenol .  OBJECTIVE: Visit Diagnoses: Problem List Items Addressed This Visit     OSA on CPAP   Morbid obesity (HCC)   Relevant Medications   tirzepatide  (MOUNJARO ) 10 MG/0.5ML Pen   Vitamin D  deficiency   B12 deficiency   Relevant Medications   Cyanocobalamin  (VITAMIN B 12) 500 MCG TABS   Hypertension associated with diabetes (HCC) (Chronic)   Relevant Medications   tirzepatide  (MOUNJARO ) 10 MG/0.5ML Pen   Hyperlipidemia associated with type 2 diabetes mellitus (HCC) (Chronic)   Relevant Medications   tirzepatide  (MOUNJARO ) 10 MG/0.5ML Pen   Diabetes (HCC) - Primary (Chronic)   Relevant Medications   tirzepatide  (MOUNJARO ) 10 MG/0.5ML Pen   Obesity He has lost 56 pounds, now weighing 327 pounds with a BMI just over 40. Adhering to a meal plan has improved his overall health, and he aims to reach 250 pounds. Muscle mass is maintained with improved adipose tissue reduction. Regular exercise is expected to further aid weight loss and health benefits. - Continue Mounjaro  10 mg for weight management - Encourage regular exercise, such as walking to the mailbox and using a peddler - Monitor weight and BMI regularly  Type 2 Diabetes Mellitus Blood sugar levels are well-controlled with Mounjaro , typically around 120 mg/dL. A1c is 6.2, slightly up from 6.1 but significantly improved from previous levels. Insulin  is not required. Mounjaro  stabilizes hunger signals, aiding dietary  choices and weight management. Long-term use of Mounjaro  or similar medications is anticipated to maintain control. - Continue Mounjaro  10 mg for diabetes management - Monitor blood sugar levels regularly - Recheck A1c in a few months Meds ordered this encounter  Medications   tirzepatide   (MOUNJARO ) 10 MG/0.5ML Pen    Sig: Inject 10 mg into the skin once a week.    Dispense:  2 mL    Refill:  0   Cyanocobalamin  (VITAMIN B 12) 500 MCG TABS    Sig: Take 500 mcg by mouth daily at 6 (six) AM.    Dispense:  30 tablet    Refill:  2    Chronic Heart Failure He experiences some leg swelling, slightly improved with increased torsemide . Movement is encouraged to reduce swelling and improve circulation. Torsemide  reduces fluid retention, crucial for heart and lung health. - Continue torsemide  as prescribed - Incorporate regular movement and exercise to reduce swelling  Hyperlipidemia LDL cholesterol is 63 mg/dL, with a goal of 55 mg/dL or less. HDL cholesterol remains below target. He is on a moderate dose of statins and Mounjaro , which help manage cholesterol levels. Mounjaro  is expected to lower cardiovascular risk and improve the cholesterol panel. - Continue current statin therapy - Monitor cholesterol levels regularly - Consider CoQ10 supplementation if muscle soreness occurs  Fatty Liver Disease Alkaline phosphatase levels are elevated, likely due to fatty liver. Continued weight loss is expected to improve liver function. He is advised to limit alcohol intake to reduce liver stress and avoid liver-toxic substances like alcohol and Tylenol . - Encourage continued weight loss - Limit alcohol intake - Monitor liver function tests regularly  General Health Maintenance Vitamin D  levels are good at 73.9 ng/mL. B12 levels may need supplementation due to elevated MCV and MCH. B12 supplementation is recommended to prevent potential deficiencies affecting energy levels and neurological function. - Continue vitamin D  supplementation at 2000 IU daily - Start B12 supplementation, 500 mcg daily, preferably sublingual - Avoid Tylenol  use Meds ordered this encounter  Medications   tirzepatide  (MOUNJARO ) 10 MG/0.5ML Pen    Sig: Inject 10 mg into the skin once a week.    Dispense:  2 mL     Refill:  0   Cyanocobalamin  (VITAMIN B 12) 500 MCG TABS    Sig: Take 500 mcg by mouth daily at 6 (six) AM.    Dispense:  30 tablet    Refill:  2    Follow-up Regular follow-up is necessary to monitor progress and adjust treatment plans as needed. He is advised to follow up with primary care for intermittent FMLA paperwork. - Recheck kidney function and liver function tests in a few months - Monitor cholesterol and A1c levels in a few months - Follow up with primary care for intermittent FMLA paperwork Vitals Temp: 99.4 F (37.4 C) BP: 120/78 Pulse Rate: 89 SpO2: 97 %   Anthropometric Measurements Height: 6' 3 (1.905 m) Weight: (!) 327 lb (148.3 kg) BMI (Calculated): 40.87 Weight at Last Visit: 332 lb Weight Lost Since Last Visit: 5 lb Weight Gained Since Last Visit: 0 Starting Weight: 383 lb Total Weight Loss (lbs): 56 lb (25.4 kg) Peak Weight: 383 lb   Body Composition  Body Fat %: 41.8 % Fat Mass (lbs): 137 lbs Muscle Mass (lbs): 181.4 lbs Visceral Fat Rating : 26   Other Clinical Data Fasting: No Labs: No Today's Visit #: 20 Starting Date: 07/31/22     ASSESSMENT AND PLAN:  Diet: Luis [name] is  currently in the action stage of change. As such, his goal is to continue with weight loss efforts. He has agreed to Category 4 Plan.  Exercise: Luis [name] has been instructed to try a geriatric exercise plan and that some exercise is better than none for weight loss and overall health benefits.   Behavior Modification:  We discussed the following Behavioral Modification Strategies today: increasing lean protein intake, decreasing simple carbohydrates, increasing vegetables, increase H2O intake, decrease ETOH, increase high fiber foods, no skipping meals, meal planning and cooking strategies, avoiding temptations, and planning for success. We discussed various medication options to help Luis [name] with his weight loss efforts and we both agreed to continue current  treatment plan.  Return in about 4 weeks (around 05/17/2024).Aaron Aas He was informed of the importance of frequent follow up visits to maximize his success with intensive lifestyle modifications for his multiple health conditions.  Attestation Statements:   Reviewed by clinician on day of visit: allergies, medications, problem list, medical history, surgical history, family history, social history, and previous encounter notes.   Time spent on visit including pre-visit chart review and post-visit care and charting was 31 minutes.    Zannah Melucci, PA-C

## 2024-04-19 ENCOUNTER — Ambulatory Visit (INDEPENDENT_AMBULATORY_CARE_PROVIDER_SITE_OTHER): Admitting: Physician Assistant

## 2024-04-19 ENCOUNTER — Other Ambulatory Visit (HOSPITAL_COMMUNITY): Payer: Self-pay

## 2024-04-19 ENCOUNTER — Encounter (INDEPENDENT_AMBULATORY_CARE_PROVIDER_SITE_OTHER): Payer: Self-pay | Admitting: Physician Assistant

## 2024-04-19 VITALS — BP 120/78 | HR 89 | Temp 99.4°F | Ht 75.0 in | Wt 327.0 lb

## 2024-04-19 DIAGNOSIS — Z794 Long term (current) use of insulin: Secondary | ICD-10-CM

## 2024-04-19 DIAGNOSIS — E1169 Type 2 diabetes mellitus with other specified complication: Secondary | ICD-10-CM

## 2024-04-19 DIAGNOSIS — E538 Deficiency of other specified B group vitamins: Secondary | ICD-10-CM

## 2024-04-19 DIAGNOSIS — E559 Vitamin D deficiency, unspecified: Secondary | ICD-10-CM

## 2024-04-19 DIAGNOSIS — I152 Hypertension secondary to endocrine disorders: Secondary | ICD-10-CM

## 2024-04-19 DIAGNOSIS — E1159 Type 2 diabetes mellitus with other circulatory complications: Secondary | ICD-10-CM | POA: Diagnosis not present

## 2024-04-19 DIAGNOSIS — Z7985 Long-term (current) use of injectable non-insulin antidiabetic drugs: Secondary | ICD-10-CM

## 2024-04-19 DIAGNOSIS — E1165 Type 2 diabetes mellitus with hyperglycemia: Secondary | ICD-10-CM | POA: Diagnosis not present

## 2024-04-19 DIAGNOSIS — Z6841 Body Mass Index (BMI) 40.0 and over, adult: Secondary | ICD-10-CM

## 2024-04-19 DIAGNOSIS — G4733 Obstructive sleep apnea (adult) (pediatric): Secondary | ICD-10-CM

## 2024-04-19 DIAGNOSIS — E785 Hyperlipidemia, unspecified: Secondary | ICD-10-CM | POA: Diagnosis not present

## 2024-04-19 MED ORDER — TIRZEPATIDE 10 MG/0.5ML ~~LOC~~ SOAJ
10.0000 mg | SUBCUTANEOUS | 0 refills | Status: DC
  Filled 2024-04-19: qty 2, 28d supply, fill #0

## 2024-04-19 MED ORDER — VITAMIN B 12 500 MCG PO TABS: 500.0000 ug | ORAL_TABLET | Freq: Every day | ORAL | 2 refills | Status: AC

## 2024-04-20 ENCOUNTER — Other Ambulatory Visit (HOSPITAL_COMMUNITY): Payer: Self-pay

## 2024-04-26 ENCOUNTER — Encounter (INDEPENDENT_AMBULATORY_CARE_PROVIDER_SITE_OTHER): Payer: Self-pay | Admitting: Physician Assistant

## 2024-04-29 ENCOUNTER — Telehealth: Payer: Self-pay | Admitting: Family Medicine

## 2024-04-29 NOTE — Telephone Encounter (Signed)
 Document FMLA, to be filled out by provider. Patient requested to send it back via Fax within 5-days. Document is located in providers tray at front office.Please advise

## 2024-05-02 NOTE — Telephone Encounter (Signed)
Form placed in PCP office to be reviewed  

## 2024-05-03 ENCOUNTER — Encounter: Payer: Self-pay | Admitting: Family Medicine

## 2024-05-03 NOTE — Telephone Encounter (Signed)
 Please see message and advise

## 2024-05-03 NOTE — Telephone Encounter (Signed)
 Can we have him come in for evaluation?  Worth HERO. Kennyth, MD 05/03/2024 11:38 AM

## 2024-05-09 DIAGNOSIS — Z0279 Encounter for issue of other medical certificate: Secondary | ICD-10-CM

## 2024-05-09 NOTE — Telephone Encounter (Signed)
 FMLA form faxed to [phone] Copy placed to be mail to patient  Form placed to be scan in patient chart

## 2024-05-09 NOTE — Telephone Encounter (Signed)
 Completed and given to stella.

## 2024-05-09 NOTE — Telephone Encounter (Signed)
 Completed and given to stella.  Worth HERO. Kennyth, MD 05/09/2024 12:58 PM

## 2024-05-10 NOTE — Telephone Encounter (Signed)
 See previews note

## 2024-05-14 DIAGNOSIS — G4733 Obstructive sleep apnea (adult) (pediatric): Secondary | ICD-10-CM | POA: Diagnosis not present

## 2024-05-16 ENCOUNTER — Other Ambulatory Visit: Payer: Self-pay | Admitting: *Deleted

## 2024-05-16 DIAGNOSIS — E876 Hypokalemia: Secondary | ICD-10-CM

## 2024-05-16 MED ORDER — POTASSIUM CHLORIDE CRYS ER 20 MEQ PO TBCR: EXTENDED_RELEASE_TABLET | ORAL | 0 refills | Status: DC

## 2024-05-16 NOTE — Progress Notes (Unsigned)
 SUBJECTIVE: Discussed the use of AI scribe software for clinical note transcription with the patient, who gave verbal consent to proceed.  Chief Complaint: Obesity  Interim History: He is down 8 pounds since last visit Down 64 pounds overall TBW loss of 16.7%  Luis [name] is here to discuss his progress with his obesity treatment plan. He is on the Category 4 Plan and states he is following his eating plan approximately 70 % of the time. He states he is exercising using a peddler 15 minutes 2 times per week.  Luis [name] is a 58 year old male with obesity, type 2 diabetes, and chronic heart failure who presents for follow-up of his obesity treatment plan.  He has lost a total of 64 pounds, with an additional 8 pounds lost since his last visit a month ago. His body adipose percentage is now 41%, and his visceral adipose rating is 25%. He exercises more, using a peddler for 15 minutes two days a week. He acknowledges occasional dietary lapses, such as consuming sodas, regular bread, and Klondike ice cream bars, but generally adheres to his meal plan. He focuses on healthier snack options like cantaloupe, honeydew melon, and watermelon.  He has type 2 diabetes and checks his blood sugar levels regularly. His blood sugar was 87 a few days ago and 116 yesterday. He administers insulin  if his blood sugar is 120 or above, which he did once last week when it was 130, attributing this spike to consuming a Klondike bar. He is currently on Mounjaro  and reports no side effects such as nausea, vomiting, constipation, diarrhea, difficulty swallowing, mood changes, or vision changes.  He has chronic heart failure and experiences bilateral lower extremity edema. The swelling fluctuates, and he suspects dietary sodium as a contributing factor. He consumes Luis [name] peanut butter and [employer] Chunky [name] Chicken and Dumplings soup, which are high in sodium.  He is on several medications  including carvedilol , potassium, baby aspirin , and vitamin D3 (2000 IU daily). He also takes B12 supplements, two tablets a day, and reports good energy levels as long as he gets adequate rest. He has been approved for FMLA, which has reduced his stress regarding work absences.  In terms of social history, he is planning to attend his school's homecoming and his sister's homecoming in October and November, respectively.   OBJECTIVE: Visit Diagnoses: Problem List Items Addressed This Visit     Chronic diastolic CHF (congestive heart failure) (HCC), ECHO 03/2017 EF 50-55%, on Torsemide    Bilateral edema of lower extremity   Morbid obesity (HCC)   Relevant Medications   tirzepatide  (MOUNJARO ) 10 MG/0.5ML Pen   Vitamin D  deficiency   B12 deficiency   BMI 40.0-44.9, adult (HCC)   Relevant Medications   tirzepatide  (MOUNJARO ) 10 MG/0.5ML Pen   Hypertension associated with diabetes (HCC) (Chronic)   Relevant Medications   tirzepatide  (MOUNJARO ) 10 MG/0.5ML Pen   Hyperlipidemia associated with type 2 diabetes mellitus (HCC) (Chronic)   Relevant Medications   tirzepatide  (MOUNJARO ) 10 MG/0.5ML Pen   Diabetes (HCC) - Primary (Chronic)   Relevant Medications   tirzepatide  (MOUNJARO ) 10 MG/0.5ML Pen   Other Visit Diagnoses       Hypokalemia       .   Relevant Medications   potassium chloride  SA (KLOR-CON  M) 20 MEQ tablet     Obesity He has lost a total of 64 pounds, with an additional 8 pounds lost since the last visit. His body adipose percentage is  41%, and his visceral adipose rating is 25%. He exercises more, using a peddler for 15 minutes two days a week. He reports occasional lapses in diet adherence but is making efforts to maintain a healthy diet by avoiding high-calorie snacks and sugary drinks. Discussed the benefits of weight loss in reducing stress on the heart and lowering the risk of stroke and heart attack. - Continue current weight loss plan - Encourage regular exercise,  including walking for at least 10 minutes daily - Advise on maintaining a healthy diet with low sodium and high protein intake Meds ordered this encounter  Medications   tirzepatide  (MOUNJARO ) 10 MG/0.5ML Pen    Sig: Inject 10 mg into the skin once a week.    Dispense:  2 mL    Refill:  0   potassium chloride  SA (KLOR-CON  M) 20 MEQ tablet    Sig: Take 1 tablet (20 mEq total) by mouth daily. Needs office visit for future refills.    Dispense:  30 tablet    Refill:  0    Need OV for future refills    Type 2 Diabetes Mellitus His blood sugar levels are well-controlled on Mounjaro  10 mg weekly and rare doses of long-acting insulin , with recent readings of 87 and 116 mg/dL. He is instructed to administer insulin  only if his blood sugar is 120 mg/dL or higher by his PCP. Lab Results  Component Value Date   HGBA1C 6.2 (H) 03/22/2024   HGBA1C 6.1 (H) 01/27/2024   HGBA1C 6.4 (H) 09/15/2023   Lab Results  Component Value Date   MICROALBUR 1.8 08/23/2020   LDLCALC 63 03/22/2024   CREATININE 1.34 (H) 03/22/2024    He is aware of the impact of simple carbohydrates on his blood sugar levels and is making dietary adjustments accordingly. Discussed the importance of maintaining blood sugar control to reduce stress on the pancreas and improve overall health outcomes. - Continue monitoring blood sugar levels - Administer insulin  if blood sugar is 120 mg/dL or higher  Chronic Heart Failure He is managing his condition with medication. Advised to monitor sodium intake to help control edema and encouraged to maintain a high protein diet to manage fluid balance. Discussed the role of protein in fluid management and the impact of sodium on edema. - Continue carvedilol  as prescribed and continue torsemide  as prescribed - Monitor sodium intake-read labels on hand and other processed food to avoid excess sodium or salt intake - Maintain high protein diet  Bilateral Lower Extremity Edema He experiences  bilateral lower extremity edema, which fluctuates and is likely related to dietary sodium intake. Identified high sodium intake from canned soups as a potential contributor to the edema. Discussed the importance of reducing sodium intake and increasing protein to manage fluid balance. - Reduce sodium intake, particularly from canned soups - Monitor edema and adjust diet accordingly  Hypertension Advised to monitor sodium intake and maintain a healthy diet to support blood pressure control. On carvedilol  12.5 mg twice daily       Torsemide  20 mg twice daily  KCl 20 mEq daily-request refill BP Readings from Last 3 Encounters:  05/17/24 99/64  04/19/24 120/78  03/22/24 118/77   Lab Results  Component Value Date   NA 142 03/22/2024   CL 103 03/22/2024   K 4.3 03/22/2024   CO2 20 03/22/2024   BUN 16 03/22/2024   CREATININE 1.34 (H) 03/22/2024   EGFR 62 03/22/2024   CALCIUM  9.0 03/22/2024   PHOS 2.3 (L) 01/04/2018  ALBUMIN 4.0 03/22/2024   GLUCOSE 127 (H) 03/22/2024   Continue to work on nutrition plan to promote weight loss and improve BP control.  - Monitor sodium intake  Vitamin D  Deficiency He is taking vitamin D3 supplements to manage vitamin D  deficiency. Currently taking 2000 IU daily. Last vitamin D  Lab Results  Component Value Date   VD25OH 73.9 03/22/2024   Low vitamin D  levels can be associated with adiposity and may result in leptin resistance and weight gain. Also associated with fatigue.  Currently on vitamin D  supplementation without any adverse effects such as nausea, vomiting or muscle weakness.  - Continue vitamin D3 supplementation at 2000 IU daily Recheck lab in 3 to 4 months  B12 deficiency He is making efforts to maintain a healthy lifestyle through diet and exercise.  Lab Results  Component Value Date   VITAMINB12 464 03/22/2024   Taking B12 supplements and a baby aspirin  daily. Discussed the importance of protein intake and sodium management for  overall health. - Continue B12 supplementation - Continue baby aspirin  daily - Encourage regular physical activity and healthy diet  Follow-up He is managing multiple chronic conditions and is advised to continue with his current treatment plans and lifestyle modifications. Approved for FMLA, which reduces stress related to work absences. - Refill Mounjaro  prescription - Refill potassium prescription  Vitals Temp: 99.3 F (37.4 C) BP: 99/64 Pulse Rate: 95 SpO2: 98 %   Anthropometric Measurements Height: 6' 3 (1.905 m) Weight: (!) 319 lb (144.7 kg) BMI (Calculated): 39.87 Weight at Last Visit: 327 lb Weight Lost Since Last Visit: 8 lb Weight Gained Since Last Visit: 0 Starting Weight: 383 lb Total Weight Loss (lbs): 64 lb (29 kg)   Body Composition  Body Fat %: 41.1 % Fat Mass (lbs): 131.4 lbs Muscle Mass (lbs): 179.2 lbs Total Body Water (lbs): 151.4 lbs Visceral Fat Rating : 25   Other Clinical Data Fasting: No Labs: No Today's Visit #: 21 Starting Date: 07/31/22     ASSESSMENT AND PLAN:  Diet: Luis [name] is currently in the action stage of change. As such, his goal is to continue with weight loss efforts. He has agreed to Category 4 Plan.  Exercise: Ehab has been instructed to try a geriatric exercise plan, that some exercise is better than none, and start walking for 10 minutes daily in your home for weight loss and overall health benefits.   Behavior Modification:  We discussed the following Behavioral Modification Strategies today: increasing lean protein intake, decreasing simple carbohydrates, increasing vegetables, increase H2O intake, decrease liquid calories, increase high fiber foods, no skipping meals, meal planning and cooking strategies, better snacking choices, avoiding temptations, and planning for success. We discussed various medication options to help Mustapha with his weight loss efforts and we both agreed to continue current treatment  plan.  Return in about 4 weeks (around 06/14/2024).SABRA He was informed of the importance of frequent follow up visits to maximize his success with intensive lifestyle modifications for his multiple health conditions.  Attestation Statements:   Reviewed by clinician on day of visit: allergies, medications, problem list, medical history, surgical history, family history, social history, and previous encounter notes.   Time spent on visit including pre-visit chart review and post-visit care and charting was 33 minutes.    Donnarae Rae, PA-C

## 2024-05-17 ENCOUNTER — Encounter (INDEPENDENT_AMBULATORY_CARE_PROVIDER_SITE_OTHER): Payer: Self-pay | Admitting: Physician Assistant

## 2024-05-17 ENCOUNTER — Other Ambulatory Visit (HOSPITAL_COMMUNITY): Payer: Self-pay

## 2024-05-17 ENCOUNTER — Ambulatory Visit (INDEPENDENT_AMBULATORY_CARE_PROVIDER_SITE_OTHER): Admitting: Physician Assistant

## 2024-05-17 ENCOUNTER — Other Ambulatory Visit (HOSPITAL_BASED_OUTPATIENT_CLINIC_OR_DEPARTMENT_OTHER): Payer: Self-pay

## 2024-05-17 VITALS — BP 99/64 | HR 95 | Temp 99.3°F | Ht 75.0 in | Wt 319.0 lb

## 2024-05-17 DIAGNOSIS — E785 Hyperlipidemia, unspecified: Secondary | ICD-10-CM | POA: Diagnosis not present

## 2024-05-17 DIAGNOSIS — E559 Vitamin D deficiency, unspecified: Secondary | ICD-10-CM

## 2024-05-17 DIAGNOSIS — E1169 Type 2 diabetes mellitus with other specified complication: Secondary | ICD-10-CM

## 2024-05-17 DIAGNOSIS — I5032 Chronic diastolic (congestive) heart failure: Secondary | ICD-10-CM

## 2024-05-17 DIAGNOSIS — E1159 Type 2 diabetes mellitus with other circulatory complications: Secondary | ICD-10-CM

## 2024-05-17 DIAGNOSIS — Z6841 Body Mass Index (BMI) 40.0 and over, adult: Secondary | ICD-10-CM

## 2024-05-17 DIAGNOSIS — Z6839 Body mass index (BMI) 39.0-39.9, adult: Secondary | ICD-10-CM

## 2024-05-17 DIAGNOSIS — Z7985 Long-term (current) use of injectable non-insulin antidiabetic drugs: Secondary | ICD-10-CM

## 2024-05-17 DIAGNOSIS — I152 Hypertension secondary to endocrine disorders: Secondary | ICD-10-CM

## 2024-05-17 DIAGNOSIS — E876 Hypokalemia: Secondary | ICD-10-CM

## 2024-05-17 DIAGNOSIS — E538 Deficiency of other specified B group vitamins: Secondary | ICD-10-CM

## 2024-05-17 DIAGNOSIS — E1165 Type 2 diabetes mellitus with hyperglycemia: Secondary | ICD-10-CM

## 2024-05-17 DIAGNOSIS — R600 Localized edema: Secondary | ICD-10-CM

## 2024-05-17 MED ORDER — TIRZEPATIDE 10 MG/0.5ML ~~LOC~~ SOAJ
10.0000 mg | SUBCUTANEOUS | 0 refills | Status: DC
Start: 2024-05-17 — End: 2024-06-15
  Filled 2024-05-17: qty 2, 28d supply, fill #0

## 2024-05-17 MED ORDER — POTASSIUM CHLORIDE CRYS ER 20 MEQ PO TBCR
20.0000 meq | EXTENDED_RELEASE_TABLET | Freq: Every day | ORAL | 0 refills | Status: DC
  Filled 2024-05-17 (×2): qty 30, 30d supply, fill #0

## 2024-06-06 ENCOUNTER — Telehealth (INDEPENDENT_AMBULATORY_CARE_PROVIDER_SITE_OTHER): Payer: Self-pay | Admitting: Physician Assistant

## 2024-06-06 ENCOUNTER — Telehealth: Payer: Self-pay | Admitting: *Deleted

## 2024-06-06 ENCOUNTER — Other Ambulatory Visit: Payer: Self-pay | Admitting: *Deleted

## 2024-06-06 DIAGNOSIS — E1159 Type 2 diabetes mellitus with other circulatory complications: Secondary | ICD-10-CM

## 2024-06-06 MED ORDER — TORSEMIDE 20 MG PO TABS: 20.0000 mg | ORAL_TABLET | Freq: Two times a day (BID) | ORAL | 5 refills | Status: DC

## 2024-06-06 NOTE — Telephone Encounter (Signed)
 Good afternoon!  It's cheaper to get his Prevaydamol (??) for blood pressure and the medication that keeps fluid off of his heart (??? I can not understand what the name is) in 90 day supplies at the [facility] ([address]) instead of [facility]. So he is requesting a switch.  Thanks!

## 2024-06-06 NOTE — Telephone Encounter (Addendum)
 Copied from CRM [id number]. Topic: Clinical - Prescription Issue >> Jun 06, 2024 11:31 AM Chasity T wrote: Reason for CRM: Patient is calling in requesting for 3 medications to be sent to the [facility] as a 90 day supply due to copay concerns.   The medications are:  potassium chloride  SA (KLOR-CON  M) 20 MEQ tablet carvedilol  (COREG ) 12.5 MG tablet torsemide  (DEMADEX ) 20 MG tablet  Pharmacy:  [facility]  Address: [address] [place], [place], [place] [address] Phone: [phone]   Paitent notified Rx Coreg  and Potassium need to contact [name], Elouise Phlegm, PA-C office for refills  Rx Demadex  send to [facility]  [facility]

## 2024-06-06 NOTE — Telephone Encounter (Signed)
 Copied from CRM [id number]. Topic: Clinical - Prescription Issue >> Jun 06, 2024 11:31 AM Chasity T wrote: Reason for CRM: Patient is calling in requesting for 3 medications to be [facility] as a 90 day supply due to copay concerns.   The medications are:  potassium chloride  SA (KLOR-CON  M) 20 MEQ tablet carvedilol  (COREG ) 12.5 MG tablet torsemide  (DEMADEX ) 20 MG tablet  Pharmacy:  [facility]  Address: [address] [place], [place], [place] [address] Phone: [phone]

## 2024-06-09 NOTE — Telephone Encounter (Signed)
 Spoke with patient finally and he is saying that his pcp has suggested that his medications potassium chloride  and carvedilol  should be prescribed in 90 supplies and having [facility] to [facility]. He was very hard to understand, kept saying he was going to hang up on me.Patient has been updated concerning his medications thru MyChart.

## 2024-06-10 ENCOUNTER — Encounter: Admitting: Family Medicine

## 2024-06-11 ENCOUNTER — Other Ambulatory Visit (INDEPENDENT_AMBULATORY_CARE_PROVIDER_SITE_OTHER): Payer: Self-pay | Admitting: Physician Assistant

## 2024-06-11 DIAGNOSIS — E1159 Type 2 diabetes mellitus with other circulatory complications: Secondary | ICD-10-CM

## 2024-06-13 ENCOUNTER — Other Ambulatory Visit: Payer: Self-pay

## 2024-06-13 DIAGNOSIS — E876 Hypokalemia: Secondary | ICD-10-CM

## 2024-06-13 MED ORDER — POTASSIUM CHLORIDE CRYS ER 20 MEQ PO TBCR: 20.0000 meq | EXTENDED_RELEASE_TABLET | Freq: Every day | ORAL | 0 refills | Status: DC

## 2024-06-14 DIAGNOSIS — G4733 Obstructive sleep apnea (adult) (pediatric): Secondary | ICD-10-CM | POA: Diagnosis not present

## 2024-06-14 NOTE — Progress Notes (Signed)
 SUBJECTIVE: Discussed the use of AI scribe software for clinical note transcription with the patient, who gave verbal consent to proceed.  Chief Complaint: Obesity  Interim History: He is down 7 lbs since his last visit.  Down 71 lbs overall TBW loss of 18.5%  Luis [name] is here to discuss his progress with his obesity treatment plan. He is on the Category 4 Plan and states he is following his eating plan approximately 85 % of the time. He states he is exercising 10 minutes 1 times per week.  Luis [name] is a 58 year old male with obesity who presents for follow-up on his obesity treatment plan. He is accompanied by his sister.  He experiences difficulty sitting at a desk and working an eight-hour shift on some days. He has been prescribed Mounjaro , which he finds effective as an appetite suppressant, and he ensures he eats three meals a day. He has lost weight, going from 319 pounds to under 300 pounds, which he notes is the first time in many years.  He has a history of type 2 diabetes, hyperlipidemia, hypertension, chronic diastolic heart failure with preserved ejection fraction, vitamin D  and B12 deficiency, obstructive sleep apnea using CPAP, and chronic bilateral lower extremity edema/lymphedema. He takes carvedilol  12.5 mg twice a day and torsemide  20 mg twice a day. No shortness of breath or pain is reported, but he mentions feeling shaky after a busy morning. He drinks plenty of water and denies feeling dehydrated. He experiences occasional tingling in his feet but has no wounds and checks his feet daily.  OBJECTIVE: Visit Diagnoses: Problem List Items Addressed This Visit     Chronic diastolic CHF (congestive heart failure) (HCC), ECHO 03/2017 EF 50-55%, on Torsemide    Relevant Medications   carvedilol  (COREG ) 12.5 MG tablet   torsemide  (DEMADEX ) 20 MG tablet   OSA on CPAP   Bilateral edema of lower extremity   Morbid obesity (HCC)   Relevant Medications    tirzepatide  (MOUNJARO ) 10 MG/0.5ML Pen   Hypertension associated with diabetes (HCC) (Chronic)   Relevant Medications   tirzepatide  (MOUNJARO ) 10 MG/0.5ML Pen   carvedilol  (COREG ) 12.5 MG tablet   torsemide  (DEMADEX ) 20 MG tablet   Diabetes (HCC) - Primary (Chronic)   Relevant Medications   tirzepatide  (MOUNJARO ) 10 MG/0.5ML Pen   Other Visit Diagnoses       BMI 39.0-39.9,adult Current BMI 39.1          Chronic diastolic heart failure with preserved ejection fraction Managed with carvedilol  and torsemide . Reports taking diuretics twice daily without dehydration. Crackles in lung bases. Heart rate slightly elevated, no dyspnea or pain. Currently appears well compensated.  - Switch carvedilol  to a 90-day supply at [facility] - Refill torsemide  prescription with 3 refills at [facility] Meds ordered this encounter  Medications   tirzepatide  (MOUNJARO ) 10 MG/0.5ML Pen    Sig: Inject 10 mg into the skin once a week.    Dispense:  2 mL    Refill:  0   carvedilol  (COREG ) 12.5 MG tablet    Sig: Take 1 tablet (12.5 mg total) by mouth 2 (two) times daily with a meal.    Dispense:  180 tablet    Refill:  0   torsemide  (DEMADEX ) 20 MG tablet    Sig: Take 1 tablet (20 mg total) by mouth 2 (two) times daily.    Dispense:  180 tablet    Refill:  3    Chronic bilateral lower extremity  edema and lymphedema Present but improved. Reports legs are better and increased mobility is beneficial. - Continue current diuretic regimen Refill torsemide  20 mg BID  Hypertension Managed with carvedilol . No symptoms of dehydration, adequate hydration reported. BP Readings from Last 3 Encounters:  06/15/24 115/78  05/17/24 99/64  04/19/24 120/78   Lab Results  Component Value Date   NA 142 03/22/2024   CL 103 03/22/2024   K 4.3 03/22/2024   CO2 20 03/22/2024   BUN 16 03/22/2024   CREATININE 1.34 (H) 03/22/2024   EGFR 62 03/22/2024   CALCIUM  9.0 03/22/2024   PHOS 2.3 (L) 01/04/2018    ALBUMIN 4.0 03/22/2024   GLUCOSE 127 (H) 03/22/2024   Continue to work on nutrition plan to promote weight loss and improve BP control.   - Switch carvedilol  to a 90-day supply at [facility] per patient requests   Type 2 diabetes mellitus On Mounjaro  10 mg weekly. Denies mass in neck, dysphagia, dyspepsia, persistent hoarseness, abdominal pain, or N/V/Constipation or diarrhea. Has annual eye exam. Mood is stable.  On statin therapy.  Not on ACE or ARB therapy.  Lab Results  Component Value Date   HGBA1C 6.2 (H) 03/22/2024   HGBA1C 6.1 (H) 01/27/2024   HGBA1C 6.4 (H) 09/15/2023   Lab Results  Component Value Date   MICROALBUR 1.8 08/23/2020   LDLCALC 63 03/22/2024   CREATININE 1.34 (H) 03/22/2024   -continue and refill Mounjaro  10 mg weekly She is working  on nutrition plan to decrease simple carbohydrates, increase lean proteins and exercise to promote weight loss and improve glycemic control .  Obesity Managed with Mounjaro  and good adherence to nutrition plan, effective appetite suppressant. Consuming three meals daily, lost seven pounds over the past 4 weeks. Down a total of 71 lbs or 18.5% from starting weight.  No excessive cravings, maintaining healthier diet. - Continue Mounjaro  for primary indication of Type 2 diabetes and well as aid in OSA management, CHF management in addition to weight management - Continues to have limited exercise tolerance.  - ? Would be a candidate for cardio-pulmonary rehab programs to help improve overall conditioning.   Obstructive sleep apnea on CPAP Managed with CPAP, no specific issues discussed. - Intensive lifestyle modifications are the first line treatment for this issue. We discussed several lifestyle modifications today and he will continue to work on diet, exercise and weight loss efforts. We will continue to monitor. Orders and follow up as documented in patient record.  Should benefit from further weight loss.   Vitals Temp: 98.2  F (36.8 C) BP: 115/78 Pulse Rate: (!) 129 SpO2: 98 %  Recheck of HR - 98, regular with provider.  Reports feels well, no increased WOB or increased swelling of LE and his legs appear smaller than previous visit as far as edema.  Lungs- fine basilar crackles, but no rhonchi or other concerning findings.   Anthropometric Measurements Height: 6' 3 (1.905 m) Weight: (!) 312 lb (141.5 kg) BMI (Calculated): 39 Weight at Last Visit: 319 lb Weight Lost Since Last Visit: 7 lb Weight Gained Since Last Visit: 0 Starting Weight: 383 lb Total Weight Loss (lbs): 71 lb (32.2 kg) Peak Weight: 383 lb   Body Composition  Body Fat %: 40.3 % Fat Mass (lbs): 126 lbs Muscle Mass (lbs): 177.4 lbs Total Body Water (lbs): 150.8 lbs Visceral Fat Rating : 24   Other Clinical Data Fasting: yes Labs: no Today's Visit #: 22 Starting Date: 07/31/22  ASSESSMENT AND PLAN:  Diet: Luis [name] is currently in the action stage of change. As such, his goal is to continue with weight loss efforts. He has agreed to Category 4 Plan.  Exercise: Luis [name] has been instructed to try a geriatric exercise plan and that some exercise is better than none for weight loss and overall health benefits.   Behavior Modification:  We discussed the following Behavioral Modification Strategies today: increasing lean protein intake, decreasing simple carbohydrates, increasing vegetables, increase H2O intake, decreasing sodium intake, increase high fiber foods, no skipping meals, meal planning and cooking strategies, avoiding temptations, and planning for success. We discussed various medication options to help Luis [name] with his weight loss efforts and we both agreed to continue current treatment plan.  He requested 90 day supply of carvedilol  and torsemide  and will order today and defer to PCP for further refills.  Return in about 4 weeks (around 07/13/2024).SABRA He was informed of the importance of frequent follow up visits  to maximize his success with intensive lifestyle modifications for his multiple health conditions.  Attestation Statements:   Reviewed by clinician on day of visit: allergies, medications, problem list, medical history, surgical history, family history, social history, and previous encounter notes.   Time spent on visit including pre-visit chart review and post-visit care and charting was 42 minutes.    Locklan Canoy, PA-C

## 2024-06-15 ENCOUNTER — Other Ambulatory Visit (HOSPITAL_BASED_OUTPATIENT_CLINIC_OR_DEPARTMENT_OTHER): Payer: Self-pay

## 2024-06-15 ENCOUNTER — Other Ambulatory Visit (HOSPITAL_COMMUNITY): Payer: Self-pay

## 2024-06-15 ENCOUNTER — Ambulatory Visit (INDEPENDENT_AMBULATORY_CARE_PROVIDER_SITE_OTHER): Admitting: Physician Assistant

## 2024-06-15 ENCOUNTER — Encounter (INDEPENDENT_AMBULATORY_CARE_PROVIDER_SITE_OTHER): Payer: Self-pay | Admitting: Physician Assistant

## 2024-06-15 VITALS — BP 115/78 | HR 129 | Temp 98.2°F | Ht 75.0 in | Wt 312.0 lb

## 2024-06-15 DIAGNOSIS — I5032 Chronic diastolic (congestive) heart failure: Secondary | ICD-10-CM | POA: Diagnosis not present

## 2024-06-15 DIAGNOSIS — I152 Hypertension secondary to endocrine disorders: Secondary | ICD-10-CM

## 2024-06-15 DIAGNOSIS — Z7985 Long-term (current) use of injectable non-insulin antidiabetic drugs: Secondary | ICD-10-CM

## 2024-06-15 DIAGNOSIS — E1165 Type 2 diabetes mellitus with hyperglycemia: Secondary | ICD-10-CM

## 2024-06-15 DIAGNOSIS — R600 Localized edema: Secondary | ICD-10-CM

## 2024-06-15 DIAGNOSIS — E1159 Type 2 diabetes mellitus with other circulatory complications: Secondary | ICD-10-CM

## 2024-06-15 DIAGNOSIS — E538 Deficiency of other specified B group vitamins: Secondary | ICD-10-CM

## 2024-06-15 DIAGNOSIS — Z6839 Body mass index (BMI) 39.0-39.9, adult: Secondary | ICD-10-CM

## 2024-06-15 DIAGNOSIS — G4733 Obstructive sleep apnea (adult) (pediatric): Secondary | ICD-10-CM

## 2024-06-15 DIAGNOSIS — E559 Vitamin D deficiency, unspecified: Secondary | ICD-10-CM

## 2024-06-15 DIAGNOSIS — E1169 Type 2 diabetes mellitus with other specified complication: Secondary | ICD-10-CM

## 2024-06-15 DIAGNOSIS — Z794 Long term (current) use of insulin: Secondary | ICD-10-CM

## 2024-06-15 MED ORDER — CARVEDILOL 12.5 MG PO TABS: 12.5000 mg | ORAL_TABLET | Freq: Two times a day (BID) | ORAL | 0 refills | Status: DC

## 2024-06-15 MED ORDER — TORSEMIDE 20 MG PO TABS
20.0000 mg | ORAL_TABLET | Freq: Two times a day (BID) | ORAL | 3 refills | Status: DC
Start: 2024-06-15 — End: 2024-08-05

## 2024-06-15 MED ORDER — TIRZEPATIDE 10 MG/0.5ML ~~LOC~~ SOAJ
10.0000 mg | SUBCUTANEOUS | 0 refills | Status: DC
  Filled 2024-06-15: qty 2, 28d supply, fill #0

## 2024-06-16 ENCOUNTER — Telehealth: Payer: Self-pay | Admitting: *Deleted

## 2024-06-16 NOTE — Telephone Encounter (Signed)
 Copied from CRM [id number]. Topic: Referral - Request for Referral >> Jun 16, 2024  2:10 PM Burnard DEL wrote: Did the patient discuss referral with their provider in the last year? Yes (If No - schedule appointment) (If Yes - send message)  Appointment offered? Yes  Type of order/referral and detailed reason for visit: Endocrinologist/swelling in both legs and feet  Preference of office, provider, location: [facility]  If referral order, have you been seen by this specialty before? No (If Yes, this issue or another issue? When? Where?  Can we respond through MyChart? Yes     Please advise  Evelynn Hench,RMA

## 2024-06-16 NOTE — Telephone Encounter (Signed)
 Can we clarify? Endocrinologist help with diabetes management. They are not going to manage feet swelling.

## 2024-06-17 NOTE — Telephone Encounter (Signed)
 Spoke with patient stated he is having swelling on legs,Cardiology prescribed Diuretic medication  Advise to contact Cardiology   Patient verbalized understanding No need Endocrinology referral at this time

## 2024-06-20 ENCOUNTER — Other Ambulatory Visit (HOSPITAL_COMMUNITY): Payer: Self-pay

## 2024-07-08 ENCOUNTER — Encounter: Payer: Self-pay | Admitting: Podiatry

## 2024-07-08 ENCOUNTER — Ambulatory Visit (INDEPENDENT_AMBULATORY_CARE_PROVIDER_SITE_OTHER): Admitting: Podiatry

## 2024-07-08 DIAGNOSIS — E1165 Type 2 diabetes mellitus with hyperglycemia: Secondary | ICD-10-CM

## 2024-07-08 DIAGNOSIS — M79675 Pain in left toe(s): Secondary | ICD-10-CM

## 2024-07-08 DIAGNOSIS — B351 Tinea unguium: Secondary | ICD-10-CM

## 2024-07-08 DIAGNOSIS — M79674 Pain in right toe(s): Secondary | ICD-10-CM

## 2024-07-08 DIAGNOSIS — I872 Venous insufficiency (chronic) (peripheral): Secondary | ICD-10-CM

## 2024-07-08 DIAGNOSIS — Z794 Long term (current) use of insulin: Secondary | ICD-10-CM

## 2024-07-08 NOTE — Progress Notes (Signed)
 This patient returns to my office for at risk foot care.  This patient requires this care by a professional since this patient will be at risk due to having diabetes and venous stasis.  This patient is unable to cut nails himself since the patient cannot reach his nails.These nails are painful walking and wearing shoes.  This patient presents for at risk foot care today. ? ?General Appearance  Alert, conversant and in no acute stress. ? ?Vascular  Dorsalis pedis and posterior tibial  pulses are not palpable  bilaterally due to severe swelling..  Capillary return is within normal limits  bilaterally. Temperature is within normal limits  bilaterally. ? ?Neurologic  Senn-Weinstein monofilament wire test within normal limits  bilaterally. Muscle power within normal limits bilaterally. ? ?Nails Thick disfigured discolored nails with subungual debris  from hallux to fifth toes bilaterally. No evidence of bacterial infection or drainage bilaterally. ? ?Orthopedic  No limitations of motion  feet .  No crepitus or effusions noted.  No bony pathology or digital deformities noted. ? ?Skin  normotropic skin with no porokeratosis noted bilaterally.  No signs of infections or ulcers noted.    ? ?Onychomycosis  Pain in right toes  Pain in left toes  Swelling legs/feet ? ?Consent was obtained for treatment procedures.   Mechanical debridement of nails 1-5  bilaterally performed with a nail nipper.  Filed with dremel without incident.  ? ? ?Return office visit   3 months                   Told patient to return for periodic foot care and evaluation due to potential at risk complications. ? ? ?Helane Gunther DPM  ?

## 2024-07-13 ENCOUNTER — Other Ambulatory Visit: Payer: Self-pay | Admitting: *Deleted

## 2024-07-13 ENCOUNTER — Encounter (INDEPENDENT_AMBULATORY_CARE_PROVIDER_SITE_OTHER): Payer: Self-pay | Admitting: Physician Assistant

## 2024-07-13 ENCOUNTER — Ambulatory Visit (INDEPENDENT_AMBULATORY_CARE_PROVIDER_SITE_OTHER): Admitting: Physician Assistant

## 2024-07-13 ENCOUNTER — Other Ambulatory Visit (HOSPITAL_COMMUNITY): Payer: Self-pay

## 2024-07-13 VITALS — BP 102/67 | HR 76 | Temp 97.9°F | Ht 75.0 in | Wt 314.0 lb

## 2024-07-13 DIAGNOSIS — I5032 Chronic diastolic (congestive) heart failure: Secondary | ICD-10-CM

## 2024-07-13 DIAGNOSIS — E559 Vitamin D deficiency, unspecified: Secondary | ICD-10-CM

## 2024-07-13 DIAGNOSIS — R600 Localized edema: Secondary | ICD-10-CM

## 2024-07-13 DIAGNOSIS — E1159 Type 2 diabetes mellitus with other circulatory complications: Secondary | ICD-10-CM

## 2024-07-13 DIAGNOSIS — Z794 Long term (current) use of insulin: Secondary | ICD-10-CM

## 2024-07-13 DIAGNOSIS — E1165 Type 2 diabetes mellitus with hyperglycemia: Secondary | ICD-10-CM

## 2024-07-13 DIAGNOSIS — E1169 Type 2 diabetes mellitus with other specified complication: Secondary | ICD-10-CM | POA: Diagnosis not present

## 2024-07-13 DIAGNOSIS — E785 Hyperlipidemia, unspecified: Secondary | ICD-10-CM

## 2024-07-13 DIAGNOSIS — E876 Hypokalemia: Secondary | ICD-10-CM

## 2024-07-13 DIAGNOSIS — I152 Hypertension secondary to endocrine disorders: Secondary | ICD-10-CM

## 2024-07-13 DIAGNOSIS — Z7985 Long-term (current) use of injectable non-insulin antidiabetic drugs: Secondary | ICD-10-CM

## 2024-07-13 DIAGNOSIS — Z6839 Body mass index (BMI) 39.0-39.9, adult: Secondary | ICD-10-CM

## 2024-07-13 MED ORDER — TIRZEPATIDE 10 MG/0.5ML ~~LOC~~ SOAJ
10.0000 mg | SUBCUTANEOUS | 0 refills | Status: DC
  Filled 2024-07-13: qty 2, 28d supply, fill #0

## 2024-07-13 MED ORDER — POTASSIUM CHLORIDE CRYS ER 20 MEQ PO TBCR: 20.0000 meq | EXTENDED_RELEASE_TABLET | Freq: Every day | ORAL | 0 refills | Status: DC

## 2024-07-13 NOTE — Progress Notes (Signed)
 SUBJECTIVE: Discussed the use of AI scribe software for clinical note transcription with the patient, who gave verbal consent to proceed.  Chief Complaint: Obesity  Interim History: He is up 2 lbs since his last visit. Down 69 lb overall TBW loss of 18 %   Muscle mass + 2.0 lbs Adipose mass +0.2 lbs  Luis [name] is here to discuss his progress with his obesity treatment plan. He is on the Category 4 Plan and states he is following his eating plan approximately 90 % of the time. 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  Luis [name] is a 58 year old male with obesity who presents for follow-up of his obesity treatment plan.  He faces challenges with his diet, including increased soda consumption and occasional deviations from his dietary plan. Despite these challenges, he has been more physically active, engaging in regular walking during football season and making trips to the mailbox, which he previously avoided. He has noticed improvements in his mobility, stating he can 'naturally stand up without needing help.'  He has type 2 diabetes and is on Mounjaro  10 mg once weekly. He experiences issues with administering the medication, noting that sometimes the liquid remains on his skin rather than being absorbed. He has increased hunger, possibly due to not receiving the full dose of his medication, but may also be related to his increased activity level . No nausea, vomiting, constipation, diarrhea, changes in vision, or difficulty swallowing or changes in mood.  His hypertension is managed with carvedilol  12.5 mg BID.  For chronic diastolic heart failure, he takes torsemide  20 mg BID. His leg swelling has improved, with pain levels significantly reduced, allowing for easier movement. He acknowledges the importance of physical activity in managing his symptoms but has misplaced his compression stockings.  He is on simvastatin  20 mg daily  for hyperlipidemia and takes cholecalciferol 2000 units daily and vitamin B12 500 mcg daily for his deficiencies. He also takes potassium supplementation 20 mEq daily. His vitamin B12 level was 464.  Regarding his sleep, he has not been using his CPAP machine due to the lack of a TV in his bedroom, which he finds necessary for sleep. He gets about seven and a half hours of sleep but acknowledges frequent bathroom breaks. He does note less bathroom breaks when using his CPAP all night.   Plan fasting labs at OV in November OBJECTIVE: Visit Diagnoses: Problem List Items Addressed This Visit     Chronic diastolic CHF (congestive heart failure) (HCC), ECHO 03/2017 EF 50-55%, on Torsemide    Bilateral edema of lower extremity   Morbid obesity (HCC)   Relevant Medications   tirzepatide  (MOUNJARO ) 10 MG/0.5ML Pen   Vitamin D  deficiency   Hypertension associated with diabetes (HCC) (Chronic)   Relevant Medications   tirzepatide  (MOUNJARO ) 10 MG/0.5ML Pen   Hyperlipidemia associated with type 2 diabetes mellitus (HCC) (Chronic)   Relevant Medications   tirzepatide  (MOUNJARO ) 10 MG/0.5ML Pen   Diabetes (HCC) - Primary (Chronic)   Relevant Medications   tirzepatide  (MOUNJARO ) 10 MG/0.5ML Pen   Other Visit Diagnoses       BMI 39.0-39.9,adult Current BMI 39.3          Type 2 diabetes mellitus Type 2 diabetes is being managed with Mounjaro  10 mg weekly. No side effects such as nausea, vomiting, constipation, diarrhea, or changes in vision reported. Denies mass in neck, dysphagia, dyspepsia, persistent hoarseness, abdominal pain, or N/V/Constipation or  diarrhea. Has annual eye exam. Mood is stable.  Lab Results  Component Value Date   HGBA1C 6.2 (H) 03/22/2024   HGBA1C 6.1 (H) 01/27/2024   HGBA1C 6.4 (H) 09/15/2023   Lab Results  Component Value Date   MICROALBUR 1.8 08/23/2020   LDLCALC 63 03/22/2024   CREATININE 1.34 (H) 03/22/2024   Nice improvements in A1c over the past year. No  longer using insulin .  Nice weight loss- TBW loss of 18% and increased activity level with significant weight loss.  - Continue/refill Mounjaro  10 mg once weekly - Ensure proper administration of Mounjaro  by pinching up skin to avoid injecting into muscle Meds ordered this encounter  Medications   tirzepatide  (MOUNJARO ) 10 MG/0.5ML Pen    Sig: Inject 10 mg into the skin once a week.    Dispense:  2 mL    Refill:  0    Obesity Obesity management is ongoing with increased physical activity, including walking during football season and daily activities. He has gained two pounds of muscle, indicating an increase in lean mass, which is beneficial for sustaining weight loss. Hunger has been slightly increased, possibly due to increased activity and muscle building. - Continue Mounjaro  10 mg once weekly - Ensure proper administration of Mounjaro  by pinching up skin to avoid injecting into muscle - Add 100-200 extra snack calories if hunger persists, focusing on protein-based snacks - Monitor hunger levels and adjust Mounjaro  dosage if necessary  Chronic diastolic heart failure with lower extremity edema Chronic diastolic heart failure with edema is being managed with torsemide . Edema is improved with current treatment and increased physical activity, which aids in fluid circulation. No increased work of breathing, activity tolerance improved, HF appears well compensated.  - Continue torsemide  20 mg BID - Encourage continued physical activity to aid in fluid circulation - Locate or replace compression stockings to manage edema  Hypertension Hypertension is well-controlled with carvedilol . Blood pressure is 102/67, which is acceptable as long as he does not experience symptoms like dizziness or fatigue. This level of control is beneficial for kidney preservation and vascular health. BP Readings from Last 3 Encounters:  07/13/24 102/67  06/15/24 115/78  05/17/24 99/64   Lab Results  Component  Value Date   NA 142 03/22/2024   CL 103 03/22/2024   K 4.3 03/22/2024   CO2 20 03/22/2024   BUN 16 03/22/2024   CREATININE 1.34 (H) 03/22/2024   EGFR 62 03/22/2024   CALCIUM  9.0 03/22/2024   PHOS 2.3 (L) 01/04/2018   ALBUMIN 4.0 03/22/2024   GLUCOSE 127 (H) 03/22/2024   Continue to work on nutrition plan to promote weight loss and improve BP control.  - Continue carvedilol  12.5 mg BID Recheck labs at Nov OV.   Hyperlipidemia Hyperlipidemia is being managed with simvastatin . Mounjaro  may also contribute to lipid management. The goal is to reduce the risk of cardiovascular events. Lab Results  Component Value Date   CHOL 121 03/22/2024   CHOL 107 09/15/2023   CHOL 133 03/16/2023   Lab Results  Component Value Date   HDL 32 (L) 03/22/2024   HDL 39 (L) 09/15/2023   HDL 33 (L) 03/16/2023   Lab Results  Component Value Date   LDLCALC 63 03/22/2024   LDLCALC 44 09/15/2023   LDLCALC 77 03/16/2023   Lab Results  Component Value Date   TRIG 146 03/22/2024   TRIG 135 09/15/2023   TRIG 127 03/16/2023   Lab Results  Component Value Date   CHOLHDL 4.5  07/31/2022   CHOLHDL 4 10/03/2021   CHOLHDL 4.1 08/23/2020   No results found for: LDLDIRECT LDL near goal, HDL below goal, Trig near goal.  - Continue simvastatin  20 mg daily Continue to work on nutrition plan -decreasing simple carbohydrates, increasing lean proteins, decreasing saturated fats and cholesterol , avoiding trans fats and exercise as able to promote weight loss, improve lipids and decrease cardiovascular risks.  Vitamin D  deficiency Vitamin D  deficiency is being managed with cholecalciferol supplementation. Last vitamin D  Lab Results  Component Value Date   VD25OH 73.9 03/22/2024   Recheck level at Nov OV - Continue cholecalciferol 2000 units daily  Vitamin B12 deficiency Vitamin B12 deficiency is being managed with supplementation. Current B12 level is 464, which is below the desired level of 500.  Supplementation is important for energy levels and nerve health. - Start vitamin B12 500 mcg daily, preferably sublingual tablets  Obstructive sleep apnea, not using CPAP Obstructive sleep apnea is not currently being managed with CPAP due to lack of a TV in the bedroom. CPAP use is important for blood pressure control and reducing cardiac strain. - Consider moving CPAP machine to a location with a TV to facilitate use - Encourage consistent use of CPAP to improve sleep quality and reduce cardiovascular risk Vitals Temp: 97.9 F (36.6 C) BP: 102/67 Pulse Rate: 76 SpO2: 100 %   Anthropometric Measurements Height: 6' 3 (1.905 m) Weight: (!) 314 lb (142.4 kg) BMI (Calculated): 39.25 Weight at Last Visit: 312 lb Weight Lost Since Last Visit: 0 Weight Gained Since Last Visit: 2 lb Starting Weight: 383 lb Total Weight Loss (lbs): 69 lb (31.3 kg)   Body Composition  Body Fat %: 40.1 % Fat Mass (lbs): 126.2 lbs Muscle Mass (lbs): 179.4 lbs Total Body Water (lbs): 149.4 lbs Visceral Fat Rating : 24   Other Clinical Data Fasting: No Labs: No Today's Visit #: 23 Starting Date: 07/31/22     ASSESSMENT AND PLAN:  Diet: Winton is currently in the action stage of change. As such, his goal is to continue with weight loss efforts. He has agreed to Category 4 Plan and +150-200 snack calories.  Exercise: Zaidin has been instructed to try a geriatric exercise plan, that some exercise is better than none, and try to increase your walking to several times weekly for weight loss and overall health benefits.   Behavior Modification:  We discussed the following Behavioral Modification Strategies today: increasing lean protein intake, decreasing simple carbohydrates, increasing vegetables, increase H2O intake, increase high fiber foods, no skipping meals, meal planning and cooking strategies, better snacking choices, avoiding temptations, and planning for success. We discussed various  medication options to help Yoshimi with his weight loss efforts and we both agreed to continue current treatment plan.  Return in about 4 weeks (around 08/10/2024).SABRA He was informed of the importance of frequent follow up visits to maximize his success with intensive lifestyle modifications for his multiple health conditions.  Attestation Statements:   Reviewed by clinician on day of visit: allergies, medications, problem list, medical history, surgical history, family history, social history, and previous encounter notes.   Time spent on visit including pre-visit chart review and post-visit care and charting was 38 minutes.    Linc Renne, PA-C

## 2024-08-05 ENCOUNTER — Ambulatory Visit: Admitting: Family Medicine

## 2024-08-05 ENCOUNTER — Encounter: Payer: Self-pay | Admitting: Family Medicine

## 2024-08-05 VITALS — BP 106/74 | HR 90 | Temp 98.2°F | Ht 75.0 in | Wt 319.0 lb

## 2024-08-05 DIAGNOSIS — Z0000 Encounter for general adult medical examination without abnormal findings: Secondary | ICD-10-CM

## 2024-08-05 DIAGNOSIS — E785 Hyperlipidemia, unspecified: Secondary | ICD-10-CM

## 2024-08-05 DIAGNOSIS — E559 Vitamin D deficiency, unspecified: Secondary | ICD-10-CM | POA: Diagnosis not present

## 2024-08-05 DIAGNOSIS — Z125 Encounter for screening for malignant neoplasm of prostate: Secondary | ICD-10-CM

## 2024-08-05 DIAGNOSIS — E1169 Type 2 diabetes mellitus with other specified complication: Secondary | ICD-10-CM | POA: Diagnosis not present

## 2024-08-05 DIAGNOSIS — E1165 Type 2 diabetes mellitus with hyperglycemia: Secondary | ICD-10-CM

## 2024-08-05 DIAGNOSIS — E538 Deficiency of other specified B group vitamins: Secondary | ICD-10-CM

## 2024-08-05 DIAGNOSIS — Z0001 Encounter for general adult medical examination with abnormal findings: Secondary | ICD-10-CM

## 2024-08-05 DIAGNOSIS — E1159 Type 2 diabetes mellitus with other circulatory complications: Secondary | ICD-10-CM

## 2024-08-05 DIAGNOSIS — E876 Hypokalemia: Secondary | ICD-10-CM

## 2024-08-05 DIAGNOSIS — Z7985 Long-term (current) use of injectable non-insulin antidiabetic drugs: Secondary | ICD-10-CM | POA: Diagnosis not present

## 2024-08-05 DIAGNOSIS — R600 Localized edema: Secondary | ICD-10-CM

## 2024-08-05 DIAGNOSIS — I152 Hypertension secondary to endocrine disorders: Secondary | ICD-10-CM

## 2024-08-05 DIAGNOSIS — Z1211 Encounter for screening for malignant neoplasm of colon: Secondary | ICD-10-CM

## 2024-08-05 DIAGNOSIS — I5032 Chronic diastolic (congestive) heart failure: Secondary | ICD-10-CM

## 2024-08-05 LAB — HEMOGLOBIN A1C: Hgb A1c MFr Bld: 6.3 % (ref 4.6–6.5)

## 2024-08-05 LAB — COMPREHENSIVE METABOLIC PANEL WITH GFR
ALT: 16 U/L (ref 0–53)
AST: 13 U/L (ref 0–37)
Albumin: 4 g/dL (ref 3.5–5.2)
Alkaline Phosphatase: 110 U/L (ref 39–117)
BUN: 14 mg/dL (ref 6–23)
CO2: 28 meq/L (ref 19–32)
Calcium: 9.1 mg/dL (ref 8.4–10.5)
Chloride: 102 meq/L (ref 96–112)
Creatinine, Ser: 1.27 mg/dL (ref 0.40–1.50)
GFR: 63.17 mL/min (ref >60.00)
Glucose, Bld: 116 mg/dL — ABNORMAL HIGH (ref 70–99)
Potassium: 3.8 meq/L (ref 3.5–5.1)
Sodium: 141 meq/L (ref 135–145)
Total Bilirubin: 0.6 mg/dL (ref 0.2–1.2)
Total Protein: 7 g/dL (ref 6.0–8.3)

## 2024-08-05 LAB — CBC
HCT: 43.6 % (ref 39.0–52.0)
Hemoglobin: 14.6 g/dL (ref 13.0–17.0)
MCHC: 33.5 g/dL (ref 30.0–36.0)
MCV: 97.2 fl (ref 78.0–100.0)
Platelets: 255 K/uL (ref 150.0–400.0)
RBC: 4.48 Mil/uL (ref 4.22–5.81)
RDW: 15.3 % (ref 11.5–15.5)
WBC: 6.8 K/uL (ref 4.0–10.5)

## 2024-08-05 LAB — LIPID PANEL
Cholesterol: 121 mg/dL (ref 0–200)
HDL: 36.8 mg/dL — ABNORMAL LOW (ref >39.00)
LDL Cholesterol: 65 mg/dL (ref 0–99)
NonHDL: 84.64
Total CHOL/HDL Ratio: 3
Triglycerides: 98 mg/dL (ref 0.0–149.0)
VLDL: 19.6 mg/dL (ref 0.0–40.0)

## 2024-08-05 LAB — PSA: PSA: 1.36 ng/mL (ref 0.10–4.00)

## 2024-08-05 LAB — TSH: TSH: 1.3 u[IU]/mL (ref 0.35–5.50)

## 2024-08-05 LAB — VITAMIN B12: Vitamin B-12: 591 pg/mL (ref 211–911)

## 2024-08-05 LAB — VITAMIN D 25 HYDROXY (VIT D DEFICIENCY, FRACTURES): VITD: 33.64 ng/mL (ref 30.00–100.00)

## 2024-08-05 MED ORDER — TORSEMIDE 20 MG PO TABS: 20.0000 mg | ORAL_TABLET | Freq: Two times a day (BID) | ORAL | 3 refills | Status: AC

## 2024-08-05 MED ORDER — CARVEDILOL 12.5 MG PO TABS: 12.5000 mg | ORAL_TABLET | Freq: Two times a day (BID) | ORAL | 3 refills | Status: AC

## 2024-08-05 MED ORDER — SIMVASTATIN 20 MG PO TABS: 20.0000 mg | ORAL_TABLET | Freq: Every day | ORAL | 3 refills | Status: DC

## 2024-08-05 MED ORDER — MUPIROCIN 2 % EX OINT: 1.0000 | TOPICAL_OINTMENT | Freq: Two times a day (BID) | CUTANEOUS | 0 refills | Status: AC

## 2024-08-05 MED ORDER — POTASSIUM CHLORIDE CRYS ER 20 MEQ PO TBCR
20.0000 meq | EXTENDED_RELEASE_TABLET | Freq: Every day | ORAL | 3 refills | Status: DC
Start: 2024-08-05 — End: 2024-08-29

## 2024-08-05 NOTE — Assessment & Plan Note (Signed)
Check vitamin D with labs.

## 2024-08-05 NOTE — Assessment & Plan Note (Signed)
 Continue management per medical weight management.  On Mounjaro  10 mg weekly.  Down about 20 pounds since last time he was here.

## 2024-08-05 NOTE — Patient Instructions (Signed)
 It was very nice to see you today!  VISIT SUMMARY: You had your annual physical exam today, where we discussed your recent weight loss, medication management, and a new nasal bump. We also coordinated your medication refills and discussed your blood pressure, diabetes, and other health concerns.  YOUR PLAN: NASAL BUMP: A bump in your left nostril that appeared three weeks ago, likely due to an infected hair follicle. -Apply mupirocin ointment to the bump twice daily for 5-7 days.  WEIGHT LOSS: You have lost 18 pounds since your last visit through a meal plan and increased physical activity. -Continue with your current meal plan and physical activity.  MEDICATION MANAGEMENT: Coordination of your medications between your primary care and weight management providers. -Refill carvedilol , torsemide , potassium, and simvastatin  for a [facility]day supply at [facility]. -Use MyChart messaging for non-urgent communication.  HYPERTENSION ASSOCIATED WITH TYPE 2 DIABETES MELLITUS: Management of your blood pressure in relation to your diabetes. -Refill carvedilol  and torsemide  for a [facility]day supply at [facility].  TYPE 2 DIABETES MELLITUS: Your diabetes management, which has improved with weight loss and exercise. -Perform blood work today to check A1c.  HYPERLIPIDEMIA: Management of your cholesterol levels. -Refill simvastatin  for a [facility]-day supply at [facility].  OBSTRUCTIVE SLEEP APNEA: Your sleep apnea, with no recent follow-up with a pulmonary specialist. -Consider scheduling a follow-up with a pulmonary specialist.  GENERAL HEALTH MAINTENANCE: Routine health maintenance including cancer screening. -Perform blood work today. -Administer flu shot today. -Order Cologuard test for colon cancer screening.  Return in about 1 year (around 08/05/2025) for Annual Physical.   Take care, Dr Kennyth  PLEASE NOTE:  If you had any lab tests, please let us  know if you have not heard back within a few days. You may see your results  on mychart before we have a chance to review them but we will give you a call once they are reviewed by us .   If we ordered any referrals today, please let us  know if you have not heard from their office within the next week.   If you had any urgent prescriptions sent in today, please check with the pharmacy within an hour of our visit to make sure the prescription was transmitted appropriately.   Please try these tips to maintain a healthy lifestyle:  Eat at least 3 REAL meals and 1-2 snacks per day.  Aim for no more than 5 hours between eating.  If you eat breakfast, please do so within one hour of getting up.   Each meal should contain half fruits/vegetables, one quarter protein, and one quarter carbs (no bigger than a computer mouse)  Cut down on sweet beverages. This includes juice, soda, and sweet tea.   Drink at least 1 glass of water with each meal and aim for at least 8 glasses per day  Exercise at least 150 minutes every week.    Preventive Care 84-68 Years Old, Male Preventive care refers to lifestyle choices and visits with your health care provider that can promote health and wellness. Preventive care visits are also called wellness exams. What can I expect for my preventive care visit? Counseling During your preventive care visit, your health care provider may ask about your: Medical history, including: Past medical problems. Family medical history. Current health, including: Emotional well-being. Home life and relationship well-being. Sexual activity. Lifestyle, including: Alcohol, nicotine or tobacco, and drug use. Access to firearms. Diet, exercise, and sleep habits. Safety issues such as seatbelt and bike helmet use. Sunscreen use.  Work and work Astronomer. Physical exam Your health care provider will check your: Height and weight. These may be used to calculate your BMI (body mass index). BMI is a measurement that tells if you are at a healthy weight. Waist  circumference. This measures the distance around your waistline. This measurement also tells if you are at a healthy weight and may help predict your risk of certain diseases, such as type 2 diabetes and high blood pressure. Heart rate and blood pressure. Body temperature. Skin for abnormal spots. What immunizations do I need?  Vaccines are usually given at various ages, according to a schedule. Your health care provider will recommend vaccines for you based on your age, medical history, and lifestyle or other factors, such as travel or where you work. What tests do I need? Screening Your health care provider may recommend screening tests for certain conditions. This may include: Lipid and cholesterol levels. Diabetes screening. This is done by checking your blood sugar (glucose) after you have not eaten for a while (fasting). Hepatitis B test. Hepatitis C test. HIV (human immunodeficiency virus) test. STI (sexually transmitted infection) testing, if you are at risk. Lung cancer screening. Prostate cancer screening. Colorectal cancer screening. Talk with your health care provider about your test results, treatment options, and if necessary, the need for more tests. Follow these instructions at home: Eating and drinking  Eat a diet that includes fresh fruits and vegetables, whole grains, lean protein, and low-fat dairy products. Take vitamin and mineral supplements as recommended by your health care provider. Do not drink alcohol if your health care provider tells you not to drink. If you drink alcohol: Limit how much you have to 0-2 drinks a day. Know how much alcohol is in your drink. In the [place] one drink equals one 12 oz bottle of beer (355 mL), one 5 oz glass of wine (148 mL), or one 1 oz glass of hard liquor (44 mL). Lifestyle Brush your teeth every morning and night with fluoride toothpaste. Floss one time each day. Exercise for at least 30 minutes 5 or more days each week. Do  not use any products that contain nicotine or tobacco. These products include cigarettes, chewing tobacco, and vaping devices, such as e-cigarettes. If you need help quitting, ask your health care provider. Do not use drugs. If you are sexually active, practice safe sex. Use a condom or other form of protection to prevent STIs. Take aspirin  only as told by your health care provider. Make sure that you understand how much to take and what form to take. Work with your health care provider to find out whether it is safe and beneficial for you to take aspirin  daily. Find healthy ways to manage stress, such as: Meditation, yoga, or listening to music. Journaling. Talking to a trusted person. Spending time with friends and family. Minimize exposure to UV radiation to reduce your risk of skin cancer. Safety Always wear your seat belt while driving or riding in a vehicle. Do not drive: If you have been drinking alcohol. Do not ride with someone who has been drinking. When you are tired or distracted. While texting. If you have been using any mind-altering substances or drugs. Wear a helmet and other protective equipment during sports activities. If you have firearms in your house, make sure you follow all gun safety procedures. What's next? Go to your health care provider once a year for an annual wellness visit. Ask your health care provider how often you should  have your eyes and teeth checked. Stay up to date on all vaccines. This information is not intended to replace advice given to you by your health care provider. Make sure you discuss any questions you have with your health care provider. Document Revised: 04/17/2021 Document Reviewed: 04/17/2021 Elsevier Patient Education  2024 [employer].

## 2024-08-05 NOTE — Assessment & Plan Note (Signed)
 Check B12

## 2024-08-05 NOTE — Assessment & Plan Note (Signed)
 Blood pressure at goal today on carvedilol  25 mg twice daily.

## 2024-08-05 NOTE — Assessment & Plan Note (Signed)
 He is following with medical weight management and is down almost 20 pounds since our last visit.  He is doing very well with Mounjaro .  Check A1c today with labs.

## 2024-08-05 NOTE — Progress Notes (Signed)
 Chief Complaint:  Luis [name] is a 58 y.o. male who presents today for his annual comprehensive physical exam.    Assessment/Plan:  New/Acute Problems: Vestibulitis No red flags.  Will start topical mupirocin.  He will let us  know if not improving.   Chronic Problems Addressed Today: Hypertension associated with diabetes (HCC) Blood pressure at goal today on carvedilol  25 mg twice daily.  Diabetes [facility]) He is following with medical weight management and is down almost 20 pounds since our last visit.  He is doing very well with Mounjaro .  Check A1c today with labs.  Hyperlipidemia associated with type 2 diabetes mellitus (HCC) On simvastatin  20 mg daily.  Check lipids today.  He is doing great job with weight loss  Morbid obesity (HCC) Continue management per medical weight management.  On Mounjaro  10 mg weekly.  Down about 20 pounds since last time he was here.  Vitamin D  deficiency Check vitamin D  with labs.  B12 deficiency Check B12.   Bilateral edema of lower extremity This is improving.  He is on torsemide  20 mg twice daily and potassium supplementation.  We are checking labs today.  Will refill meds today per patient request.  Preventative Healthcare: Check labs.  Flu vaccine given today.  Will order Cologuard.  Patient Counseling(The following topics were reviewed and/or handout was given):  -Nutrition: Stressed importance of moderation in sodium/caffeine intake, saturated fat and cholesterol, caloric balance, sufficient intake of fresh fruits, vegetables, and fiber.  -Stressed the importance of regular exercise.   -Substance Abuse: Discussed cessation/primary prevention of tobacco, alcohol, or other drug use; driving or other dangerous activities under the influence; availability of treatment for abuse.   -Injury prevention: Discussed safety belts, safety helmets, smoke detector, smoking near bedding or upholstery.   -Sexuality: Discussed sexually transmitted  diseases, partner selection, use of condoms, avoidance of unintended pregnancy and contraceptive alternatives.   -Dental health: Discussed importance of regular tooth brushing, flossing, and dental visits.  -Health maintenance and immunizations reviewed. Please refer to Health maintenance section.  Return to care in 1 year for next preventative visit.     Subjective:  HPI:  He has no acute complaints today. Patient is here today for his annual physical.  See assessment / plan for status of chronic conditions.  Discussed the use of AI scribe software for clinical note transcription with the patient, who gave verbal consent to proceed.  History of Present Illness [name] is a 58 year old male who presents for an annual physical exam and evaluation of a nasal bump.  A nasal bump appeared suddenly about three weeks ago in his left nostril. It is not painful and he is not aware of it unless he touches it, although it sometimes induces sneezing.  He has been experiencing intentional weight loss, having lost 18 pounds since his last visit in May. This was achieved through a meal plan that reduces calorie intake and increased physical activity, including using a peddler at his desk and walking at his grandson's football games. He reports feeling good, that his leg swelling is getting better, and that he is pain free and walking like a normal person. He wears compression socks and takes marjoram, which he finds helpful.  He is currently on several medications including carvedilol , torsemide , potassium, and simvastatin . There was some confusion regarding the management of these prescriptions between his primary care and weight management providers, but [facility] due  to insurance requirements.  He has not seen a cardiologist recently, and his last echocardiogram was a couple of years ago, which was normal. He has a history of diabetes, and his A1c was  last checked in May. He is due for blood work today and is fasting.        03/10/2024    1:10 PM  Depression screen PHQ 2/9  Decreased Interest 0  Down, Depressed, Hopeless 0  PHQ - 2 Score 0    Health Maintenance Due  Topic Date Due   Hepatitis C Screening  Never done   Pneumococcal Vaccine: 50+ Years (1 of 2 - PCV) Never done   Hepatitis B Vaccines 19-59 Average Risk (1 of 3 - 19+ 3-dose series) Never done   Fecal DNA (Cologuard)  Never done   Diabetic kidney evaluation - Urine ACR  08/23/2021   Zoster Vaccines- Shingrix  (2 of 2) 03/06/2023   FOOT EXAM  09/06/2023   Influenza Vaccine  06/03/2024   COVID-19 Vaccine (8 - 2025-26 season) 07/04/2024     ROS: Per HPI, otherwise a complete review of systems was negative.   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 03/2016 Echo: EF 35-40%, diff HK, GrII DD; b. 03/2017 Echo: EF 50-55%; c. 12/2021 Echo: EF 60-65%, no rwma, GrI DD, mod enlarged RV w/ nl fxn, Ao root 38mm, Asc Ao 37mm.   Difficult intubation 12/06/2020   DM (diabetes mellitus) (HCC)    Edema    both ankles and feet   Essential hypertension    History of tobacco abuse    Hyperlipidemia    Morbid obesity (HCC)    NICM (nonischemic cardiomyopathy) (HCC)    a. 03/2016 Echo: EF 35-40%, diff HK, GrII DD; b. 03/2017 Echo: EF 50-55%; c. 12/2021 Echo: EF 60-65%, no rwma, GrI DD.   Non-obstructive CAD (coronary artery disease)    a. 03/2016 Cath: LM nl, LAD 35p, LCX nl, RCA nl. EF 35-45%.   Noncompliance    OSA (obstructive sleep apnea)    Pneumonia 2017   Premature atrial contractions    Shortness of breath    Sleep apnea    Patient Active Problem List   Diagnosis Date Noted   BMI 40.0-44.9, adult (HCC) 01/26/2023   CHF NYHA class I, chronic, diastolic (HCC) 08/14/2022   Vitamin D  deficiency 08/14/2022   Elevated alkaline phosphatase  level 08/14/2022   B12 deficiency 08/14/2022   SOBOE (shortness of breath on exertion) 07/31/2022   Other fatigue 07/31/2022   Lower extremity edema 07/31/2022   Depression 07/31/2022   Atrial fibrillation (HCC) 07/31/2022   Swelling 08/28/2021   Morbid obesity (HCC) 05/27/2021   Difficult intubation 12/06/2020   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   Adjustment reaction 04/10/2020   Venous stasis dermatitis of both lower extremities 11/10/2019   Pain due to onychomycosis of toenails of both feet 04/29/2019   Diabetes (HCC) 02/02/2018   Renal insufficiency 01/01/2018   Onychomycosis, followed by Podiatry 06/14/2017   Bilateral edema of lower extremity 06/14/2017   Hyperlipidemia associated with type 2 diabetes mellitus (HCC)    Difficulty walking, debility, deconditioned 06/11/2017   OSA on CPAP 03/31/2017   NICM (nonischemic cardiomyopathy) (HCC), left heart cath and coronary angiography 2017    Former smoker    Chronic diastolic CHF (congestive heart failure) (HCC), ECHO 03/2017 EF 50-55%, on Torsemide   Cough    Hypertension associated with diabetes (HCC) 03/19/2016   Paroxysmal atrial fibrillation (HCC) 03/19/2016   Past Surgical History:  Procedure Laterality Date   CARDIAC CATHETERIZATION N/A 03/24/2016   Procedure: Left Heart Cath and Coronary Angiography;  Surgeon: Alm LELON Clay, MD;  Location: [facility] INVASIVE CV LAB;  Service: Cardiovascular;  Laterality: N/A;   FEMUR IM NAIL Right 2012   FRACTURE SURGERY      Family History  Problem Relation Age of Onset   Diabetes Mellitus II Mother    Hypertension Maternal Grandfather    Diabetes type II Other    Stroke Neg Hx    Cancer Neg Hx    Colon cancer Neg Hx    Colon polyps Neg Hx    Esophageal cancer Neg Hx    Rectal cancer Neg Hx    Stomach cancer Neg Hx     Medications- reviewed and updated Current Outpatient Medications  Medication Sig Dispense Refill   aspirin  EC 81 MG EC tablet Take 1 tablet (81 mg total) by mouth  daily. 30 tablet 0   Cholecalciferol (VITAMIN D3) 50 MCG (2000 UT) capsule Take 1 capsule (2,000 Units total) by mouth daily.     Cyanocobalamin  (VITAMIN B 12) 500 MCG TABS Take 500 mcg by mouth daily at 6 (six) AM. 30 tablet 2   glucose blood test strip Check glucose TID/ E11.9 100 each 12   Insulin  Pen Needle 31G X 8 MM MISC Use to inject insulin  twice daily 100 each 11   Insulin  Pen Needle 32G X 6 MM MISC Use to administer insulin  two times daily 90 each 11   Multiple Vitamins-Minerals (MULTIVITAMIN WITH MINERALS) tablet Take 1 tablet by mouth daily.     mupirocin ointment (BACTROBAN) 2 % Apply 1 Application topically 2 (two) times daily. 22 g 0   OneTouch Delica Lancets 33G MISC Use TID/ E11.9 100 each 5   tirzepatide  (MOUNJARO ) 10 MG/0.5ML Pen Inject 10 mg into the skin once a week. 2 mL 0   albuterol  (VENTOLIN  HFA) 108 (90 Base) MCG/ACT inhaler Inhale 1-2 puffs into the lungs every 6 (six) hours as needed for wheezing or shortness of breath. (Patient not taking: Reported on 08/05/2024) 1 each 0   carvedilol  (COREG ) 12.5 MG tablet Take 1 tablet (12.5 mg total) by mouth 2 (two) times daily with a meal. 180 tablet 3   Continuous Glucose Sensor (FREESTYLE LIBRE 3 PLUS SENSOR) MISC Change sensor every 15 days. (Patient not taking: Reported on 08/05/2024) 2 each 1   potassium chloride  SA (KLOR-CON  M) 20 MEQ tablet Take 1 tablet (20 mEq total) by mouth daily. 90 tablet 3   simvastatin  (ZOCOR ) 20 MG tablet Take 1 tablet (20 mg total) by mouth at bedtime. 90 tablet 3   torsemide  (DEMADEX ) 20 MG tablet Take 1 tablet (20 mg total) by mouth 2 (two) times daily. 180 tablet 3   No current facility-administered medications for this visit.    Allergies-reviewed and updated Allergies  Allergen Reactions   Demerol [Meperidine] Itching   Morphine And Codeine Itching    Social History   Socioeconomic History   Marital status: Single    Spouse name: Not on file   Number of children: 1   Years of  education: 14   Highest education level: Not on file  Occupational History   Occupation: [employer]  Tobacco Use   Smoking status: Every Day    Current packs/day: 0.00    Average packs/day: 0.5 packs/day  for 20.0 years (10.0 ttl pk-yrs)    Types: Cigarettes    Start date: 03/02/1989    Last attempt to quit: 03/02/2009    Years since quitting: 15.4   Smokeless tobacco: Never   Tobacco comments:    Started back smoking 09-06-2020 after his mom died (1 pack every 2 days) December 12, 2023 hfb  Vaping Use   Vaping status: Never Used  Substance and Sexual Activity   Alcohol use: Yes    Comment: occassional   Drug use: No   Sexual activity: Never  Other Topics Concern   Not on file  Social History Narrative   ** Merged History Encounter **       Lives in [place] with mother and three grandchildren.     Works for [employer] - works from home.     Does not routinely exercise.   Caffeine use: none   Fun/Hobby: Play video games and play with grandchildren.    Right handed    Social Drivers of Corporate investment banker Strain: Not on file  Food Insecurity: Not on file  Transportation Needs: Not on file  Physical Activity: Not on file  Stress: Not on file  Social Connections: Not on file        Objective:  Physical Exam: BP 106/74   Pulse 90   Temp 98.2 F (36.8 C) (Oral)   Ht 6' 3 (1.905 m)   Wt (!) 319 lb (144.7 kg)   SpO2 97%   BMI 39.87 kg/m   Body mass index is 39.87 kg/m. Wt Readings from Last 3 Encounters:  08/05/24 (!) 319 lb (144.7 kg)  07/13/24 (!) 314 lb (142.4 kg)  06/15/24 (!) 312 lb (141.5 kg)   Gen: NAD, resting comfortably HEENT: TMs normal bilaterally.  Left nare with pustule along lateral edge.  OP clear. No thyromegaly noted.  CV: RRR with no murmurs appreciated Pulm: NWOB, CTAB with no crackles, wheezes, or rhonchi GI: Normal bowel sounds present. Soft, Nontender, Nondistended. MSK: no edema, cyanosis, or clubbing noted Skin: warm,  dry Neuro: CN2-12 grossly intact. Strength 5/5 in upper and lower extremities. Reflexes symmetric and intact bilaterally.  Psych: Normal affect and thought content     Cloyde Oregel M. Kennyth, MD 08/05/2024 10:05 AM

## 2024-08-05 NOTE — Assessment & Plan Note (Signed)
 On simvastatin  20 mg daily.  Check lipids today.  He is doing great job with weight loss

## 2024-08-05 NOTE — Assessment & Plan Note (Signed)
 This is improving.  He is on torsemide  20 mg twice daily and potassium supplementation.  We are checking labs today.  Will refill meds today per patient request.

## 2024-08-08 ENCOUNTER — Ambulatory Visit: Payer: Self-pay | Admitting: Family Medicine

## 2024-08-08 NOTE — Progress Notes (Signed)
 His A1c is 6.3.  This is similar to his past several values.  All of his other labs are at goal.  Do not need to make any changes to his treatment plan.  He should keep up the great work and we can recheck everything in a year or so.

## 2024-08-09 NOTE — Progress Notes (Unsigned)
 SUBJECTIVE: Discussed the use of AI scribe software for clinical note transcription with the patient, who gave verbal consent to proceed.  Chief Complaint: Obesity  Interim History: He has maintained his weight since last visit.  Down 69 lbs overall TBW loss of 18.0%  Zylan is here to discuss his progress with his obesity treatment plan. He is on the Category 4 Plan and states he is following his eating plan approximately 80 % of the time. He states he is exercising pedaling at work and walking more 7 times per week. Keep it up!  Maycen Degregory is a 58 year old male with obesity who presents for follow-up of his obesity treatment plan.  He feels 'really, really good' and believes his body has adjusted to his current medication regimen. Despite eating healthily, he notes an increase in hunger and appetite, which may be attributed to increased physical activity over the past couple of months. He mentions eating more frequently, sometimes having a peanut butter sandwich shortly after a meal due to still feeling hungry.  He has been on Mounjaro  10 mg dosage since Dec of 2024 and has increased his activity level significantly.   He has type 2 diabetes and is currently on Mounjaro  10 mg weekly. His glucose levels are slightly elevated, but his A1c is 6.3, an improvement from previous levels. He is mindful of his diet, focusing on Camie Ruth low-calorie bread and incorporating fruits like cantaloupe, honeydew melon, and watermelon. He is working on reducing juice intake due to its high sugar content.  He has chronic diastolic heart failure with mostly preserved ejection fraction and is on carvedilol  12.5 mg BID and torsemide  20 mg BID. He reports improved exercise tolerance, stating he no longer gets short of breath walking to the exam room and is able to use the peddler for exercise. He wears compression socks, which have helped reduce leg swelling and pain.  He is managing hyperlipidemia  with simvastatin  20 mg daily and reports his cholesterol numbers have improved. He continues to take cholecalciferol 2000 units daily for vitamin D  deficiency and vitamin B12 500 mcg daily for B12 deficiency. He reports no side effects from these supplements and maintains good energy levels.  He has obstructive sleep apnea and uses CPAP. He notes that his energy levels are good, especially when he gets adequate sleep.  Labs done by PCP AT recent office visit. Reviewed today during visit.  OBJECTIVE: Visit Diagnoses: Problem List Items Addressed This Visit     Chronic diastolic CHF (congestive heart failure) (HCC), ECHO 03/2017 EF 50-55%, on Torsemide    Morbid obesity (HCC)   Relevant Medications   tirzepatide  (MOUNJARO ) 12.5 MG/0.5ML Pen   Vitamin D  deficiency   B12 deficiency   Hypertension associated with diabetes (HCC) (Chronic)   Relevant Medications   tirzepatide  (MOUNJARO ) 12.5 MG/0.5ML Pen   Hyperlipidemia associated with type 2 diabetes mellitus (HCC) (Chronic)   Relevant Medications   tirzepatide  (MOUNJARO ) 12.5 MG/0.5ML Pen   Diabetes (HCC) - Primary (Chronic)   Relevant Medications   tirzepatide  (MOUNJARO ) 12.5 MG/0.5ML Pen   Other Visit Diagnoses       BMI 39.0-39.9,adult Current BMI 39.3          Obesity Obesity management is ongoing. Despite increased activity levels, weight has remained static, indicating a need for adjustment in the treatment plan. Increased hunger is noted, likely due to increased physical activity. He has adjusted to the current Mounjaro  dose, and an increase is considered reasonable  to support weight loss. - Increase Mounjaro  to 12.5 mg weekly - Encourage continued physical activity - Advise on dietary adjustments to include more protein and fiber - Discuss potential future increase to 15 mg Mounjaro  if needed  Type 2 Diabetes Mellitus with hyperglycemia, without long-term current use of insulin  HgbA1c is at goal. Last A1c was 6.3 but slightly  up from previous.   Medication(s): Mounjaro  10 mg SQ weekly   Denies mass in neck, dysphagia, dyspepsia, persistent hoarseness, abdominal pain, or N/V/Constipation or diarrhea. Has annual eye exam. Mood is stable.   Lab Results  Component Value Date   HGBA1C 6.3 08/05/2024   HGBA1C 6.2 (H) 03/22/2024   HGBA1C 6.1 (H) 01/27/2024   Lab Results  Component Value Date   MICROALBUR 1.8 08/23/2020   LDLCALC 65 08/05/2024   CREATININE 1.27 08/05/2024   Lab Results  Component Value Date   GFR 63.17 08/05/2024   GFR 79.16 10/03/2021   GFR 80.21 06/16/2019   Type 2 diabetes is being managed with Mounjaro  and dietary modifications. A1c is 6.3, slightly higher than previous but significantly improved from past levels. Glucose control is not yet optimal, with an average glucose level above target. He is advised to reduce sugar intake to improve glucose control. - Continue/increase Mounjaro  to 12.5 mg weekly - Encourage dietary modifications to reduce sugar intake - Recheck A1c in 3-4 months Meds ordered this encounter  Medications   DISCONTD: tirzepatide  (MOUNJARO ) 12.5 MG/0.5ML Pen    Sig: Inject 12.5 mg into the skin once a week.    Dispense:  2 mL    Refill:  0   tirzepatide  (MOUNJARO ) 12.5 MG/0.5ML Pen    Sig: Inject 12.5 mg into the skin once a week.    Dispense:  2 mL    Refill:  0   Overall very good control of T2DM.  Plan: Continue and increase dose Mounjaro  12.5 mg SQ weekly He is working  on nutrition plan to decrease simple carbohydrates, increase lean proteins and exercise to promote weight loss and improve glycemic control .   Hyperlipidemia LDL is at goal. Medication(s): simvastatin  20 mg daily. No SE.    Mounjaro  10 mg weekly Cardiovascular risk factors: advanced age (older than 42 for men, 58 for women), diabetes mellitus, dyslipidemia, family history of premature cardiovascular disease, hypertension, male gender, obesity (BMI >= 30 kg/m2), sedentary lifestyle, and  smoking/ tobacco exposure  Lab Results  Component Value Date   CHOL 121 08/05/2024   HDL 36.80 (L) 08/05/2024   LDLCALC 65 08/05/2024   TRIG 98.0 08/05/2024   CHOLHDL 3 08/05/2024   CHOLHDL 4.5 07/31/2022   CHOLHDL 4 10/03/2021   Lab Results  Component Value Date   ALT 16 08/05/2024   AST 13 08/05/2024   ALKPHOS 110 08/05/2024   BILITOT 0.6 08/05/2024   The ASCVD Risk score (Arnett DK, et al., 2019) failed to calculate for the following reasons:   The valid total cholesterol range is 130 to 320 mg/dL Hyperlipidemia is managed with simvastatin . Lipid levels have improved, with triglycerides at goal and LDL slightly above target for diabetes management. The combination of Mounjaro  and simvastatin  is expected to further improve lipid levels. - Continue simvastatin  - Recheck lipid panel in 3-4 months Plan: Continue simvastatin  20 mg daily. Mounjaro  10 mg weekly.  Continue to work on nutrition plan -decreasing simple carbohydrates, increasing lean proteins, decreasing saturated fats and cholesterol , avoiding trans fats and exercise as able to promote weight loss, improve lipids  and decrease cardiovascular risks.  Hypertension Hypertension well controlled, asymptomatic, and no significant medication side effects noted.  Medication(s): carvedilol  12.5 mg BID   Demadex  20 mg BID   KCL 20 mEq daily BP Readings from Last 3 Encounters:  08/10/24 113/73  08/05/24 106/74  07/13/24 102/67   Lab Results  Component Value Date   CREATININE 1.27 08/05/2024   CREATININE 1.34 (H) 03/22/2024   CREATININE 1.30 (H) 01/27/2024   Lab Results  Component Value Date   GFR 63.17 08/05/2024   GFR 79.16 10/03/2021   GFR 80.21 06/16/2019   No symptoms of hypotension.  Plan: Continue all antihypertensives at current dosages. Continue to work on nutrition plan to promote weight loss and improve BP control.   Chronic diastolic HF- Edema well controlled inIncreased physical activity has improved  symptoms such as shortness of breath and leg pain.Well compensated.  On Demadex  20 mg BID and KCL replacement  - Consider additional medications to support heart function if needed  Continue  compression stockings to Lower extremities to control peripheral edema.  Continue to work on nutrition plan and increase exercise to promote healthy weight loss and increase endurance/exercise tolerance.   Vitamin D  Deficiency Vitamin D  is not at goal of 50.  Most recent vitamin D  level was 33.64. He is on OTC vitamin D3 2000 IU daily. No N/V or muscle weakness with vitamin D  Lab Results  Component Value Date   VD25OH 33.64 08/05/2024   VD25OH 73.9 03/22/2024   VD25OH 84.0 01/27/2024  . Current dose is 2000 units daily, with no side effects reported. An increase in dosage is recommended to improve vitamin D  levels.  Plan: Continue and increase dose OTC vitamin D  to 3000 units daily.  Low vitamin D  levels can be associated with adiposity and may result in leptin resistance and weight gain. Also associated with fatigue.  Currently on vitamin D  supplementation without any adverse effects such as nausea, vomiting or muscle weakness.  Recheck level in 3-4 months.    B 12 deficiency Lab Results  Component Value Date   VITAMINB12 591 08/05/2024   On B 12 supplementation 500 mcg daily with good effect.  Continue B 12 500 mcg daily to support cell function, energy levels and nerve function.   General Health Maintenance General health maintenance includes immunizations and lifestyle modifications. Flu shot received, but RSV and COVID vaccinations not confirmed. He is advised to take precautions against COVID due to increased risk of complications. - Consider RSV vaccination if around children - Advise on COVID precautions and consider vaccination - Encourage continued healthy lifestyle choices Vitals Temp: 98.1 F (36.7 C) BP: 113/73 Pulse Rate: 91 SpO2: 97 %   Anthropometric  Measurements Height: 6' 3 (1.905 m) Weight: (!) 314 lb (142.4 kg) BMI (Calculated): 39.25 Weight at Last Visit: 314 lb Weight Lost Since Last Visit: 0 Weight Gained Since Last Visit: 0 Starting Weight: 383 lb Total Weight Loss (lbs): 69 lb (31.3 kg) Peak Weight: 383 lb   Body Composition  Body Fat %: 40.4 % Fat Mass (lbs): 127 lbs Muscle Mass (lbs): 178.2 lbs Total Body Water (lbs): 149 lbs Visceral Fat Rating : 24   Other Clinical Data Fasting: No Labs: No Today's Visit #: 24 Starting Date: 07/31/22     ASSESSMENT AND PLAN:  Diet: Macdonald is currently in the action stage of change. As such, his goal is to continue with weight loss efforts. He has agreed to Category 4 Plan.  Exercise: Gervase has  been instructed to work up to a goal of 150 minutes of combined cardio and strengthening exercise per week and to continue exercising as is for weight loss and overall health benefits.   Behavior Modification:  We discussed the following Behavioral Modification Strategies today: increasing lean protein intake, decreasing simple carbohydrates, increasing vegetables, increase H2O intake, increase high fiber foods, no skipping meals, meal planning and cooking strategies, avoiding temptations, and planning for success. We discussed various medication options to help Roberta with his weight loss efforts and we both agreed to continue current treatment plan and increase Mounjaro  to 12.5 mg weekly for T2DM.  Return in about 4 weeks (around 09/07/2024).SABRA He was informed of the importance of frequent follow up visits to maximize his success with intensive lifestyle modifications for his multiple health conditions.  Attestation Statements:   Reviewed by clinician on day of visit: allergies, medications, problem list, medical history, surgical history, family history, social history, and previous encounter notes.   Time spent on visit including pre-visit chart review and post-visit care  and charting was 40 minutes.    Makynli Stills, PA-C

## 2024-08-10 ENCOUNTER — Encounter (INDEPENDENT_AMBULATORY_CARE_PROVIDER_SITE_OTHER): Payer: Self-pay | Admitting: Physician Assistant

## 2024-08-10 ENCOUNTER — Ambulatory Visit (INDEPENDENT_AMBULATORY_CARE_PROVIDER_SITE_OTHER): Admitting: Physician Assistant

## 2024-08-10 ENCOUNTER — Other Ambulatory Visit (HOSPITAL_COMMUNITY): Payer: Self-pay

## 2024-08-10 VITALS — BP 113/73 | HR 91 | Temp 98.1°F | Ht 75.0 in | Wt 314.0 lb

## 2024-08-10 DIAGNOSIS — E1165 Type 2 diabetes mellitus with hyperglycemia: Secondary | ICD-10-CM | POA: Diagnosis not present

## 2024-08-10 DIAGNOSIS — E1159 Type 2 diabetes mellitus with other circulatory complications: Secondary | ICD-10-CM

## 2024-08-10 DIAGNOSIS — I152 Hypertension secondary to endocrine disorders: Secondary | ICD-10-CM

## 2024-08-10 DIAGNOSIS — Z6839 Body mass index (BMI) 39.0-39.9, adult: Secondary | ICD-10-CM

## 2024-08-10 DIAGNOSIS — E785 Hyperlipidemia, unspecified: Secondary | ICD-10-CM

## 2024-08-10 DIAGNOSIS — I5032 Chronic diastolic (congestive) heart failure: Secondary | ICD-10-CM

## 2024-08-10 DIAGNOSIS — E1169 Type 2 diabetes mellitus with other specified complication: Secondary | ICD-10-CM | POA: Diagnosis not present

## 2024-08-10 DIAGNOSIS — Z794 Long term (current) use of insulin: Secondary | ICD-10-CM

## 2024-08-10 DIAGNOSIS — G4733 Obstructive sleep apnea (adult) (pediatric): Secondary | ICD-10-CM

## 2024-08-10 DIAGNOSIS — Z7985 Long-term (current) use of injectable non-insulin antidiabetic drugs: Secondary | ICD-10-CM

## 2024-08-10 DIAGNOSIS — E559 Vitamin D deficiency, unspecified: Secondary | ICD-10-CM

## 2024-08-10 DIAGNOSIS — E538 Deficiency of other specified B group vitamins: Secondary | ICD-10-CM

## 2024-08-10 MED ORDER — TIRZEPATIDE 12.5 MG/0.5ML ~~LOC~~ SOAJ: 12.5000 mg | SUBCUTANEOUS | 0 refills | Status: DC

## 2024-08-10 MED ORDER — TIRZEPATIDE 12.5 MG/0.5ML ~~LOC~~ SOAJ
12.5000 mg | SUBCUTANEOUS | 0 refills | Status: DC
  Filled 2024-08-10: qty 2, 28d supply, fill #0

## 2024-08-12 ENCOUNTER — Other Ambulatory Visit (HOSPITAL_COMMUNITY): Payer: Self-pay

## 2024-08-13 ENCOUNTER — Other Ambulatory Visit: Payer: Self-pay | Admitting: Family Medicine

## 2024-08-13 DIAGNOSIS — E876 Hypokalemia: Secondary | ICD-10-CM

## 2024-08-23 ENCOUNTER — Other Ambulatory Visit: Payer: Self-pay | Admitting: Family Medicine

## 2024-08-23 DIAGNOSIS — E876 Hypokalemia: Secondary | ICD-10-CM

## 2024-08-29 ENCOUNTER — Other Ambulatory Visit: Payer: Self-pay | Admitting: *Deleted

## 2024-08-29 ENCOUNTER — Telehealth: Payer: Self-pay | Admitting: *Deleted

## 2024-08-29 DIAGNOSIS — E876 Hypokalemia: Secondary | ICD-10-CM

## 2024-08-29 MED ORDER — POTASSIUM CHLORIDE CRYS ER 20 MEQ PO TBCR: 20.0000 meq | EXTENDED_RELEASE_TABLET | Freq: Every day | ORAL | 3 refills | Status: AC

## 2024-08-29 NOTE — Telephone Encounter (Signed)
 Copied from CRM [id number]. Topic: Clinical - Medication Question >> Aug 29, 2024 12:28 PM Viola F wrote: Reason for CRM: Patient hasn't taken the potassium chloride  SA (KLOR-CON  M) 20 MEQ tablet since 10/21 and would like it resent to the [id number] - [place], [place] - [address]   [facility]

## 2024-09-02 ENCOUNTER — Telehealth: Payer: Self-pay | Admitting: *Deleted

## 2024-09-02 NOTE — Telephone Encounter (Signed)
 Copied from CRM [id number]. Topic: Clinical - Medication Question >> Aug 31, 2024  3:04 PM Mercedes MATSU wrote: Reason for CRM: Patient wants to know if he should be around someone with the flu, he has some other questions and is asking for a call back [phone]   Spoke with patient. Not worry anymore since he realized had his flu vaccine this season  [facility]

## 2024-09-06 NOTE — Progress Notes (Unsigned)
 SUBJECTIVE: Discussed the use of AI scribe software for clinical note transcription with the patient, who gave verbal consent to proceed.  Chief Complaint: Obesity  Interim History: He is down 1 lb since his last visit.  Down 70 lbs overall TBW loss of 18.3%  Luis [name] is here to discuss his progress with his obesity treatment plan. He is on the Category 4 Plan and states he is following his eating plan approximately 90 % of the time. He states he is exercising pedal bike/walking  7 times per week.  Luis [name] is a 58 year old male with obesity, type 2 diabetes, hyperlipidemia, and heart failure who presents for follow-up on his obesity treatment plan.  He adheres to a category 4 nutrition plan 90% of the time, focusing on whole foods, adequate protein, and water intake. He has lost a total of 70 pounds, with a 1-pound loss since his last visit. He is not sleeping more than seven hours per night.  He engages in physical activity by walking and using a pedal bike seven days a week. Walking feels more normal,his feet are not painful like they were previously and he can attend football games, involving significant walking, without difficulty. He can now walk to the car after football games, which he previously could not do.  He is currently on Mounjaro  12.5 mg weekly, vitamin D3 2000 units daily, B12 500 mcg daily, carvedilol  12.5 mg twice a day, simvastatin  20 mg daily, torsemide  20 mg up to twice daily for edema, and a daily baby aspirin .  Recent labs from October 3rd showed improved kidney function, normal liver function, and an increase in HDL cholesterol. Other cholesterol numbers were fine, and his A1c was 6.3.  He has good energy levels and has noticed an increase in muscle mass by 1.4 pounds, attributed to his physical activity. He has lost almost three pounds of adipose tissue, with his body adipose percentage decreasing from 46.8% to 39.7%.  His visceral adipose number,  which started at 32 and has decreased to 24- still above goal of 10, but significantly improved. He recalls frequent hospitalizations in the past but none over the past year and generally feels much improved over previous years.   He wants to reduce his weight to under 300 pounds and discusses the challenges of maintaining dietary discipline, especially during the holiday season. We discussed strategies for portion control and the importance of not feeling deprived while managing his diet during the holiday festivities.  OBJECTIVE: Visit Diagnoses: Problem List Items Addressed This Visit     Chronic diastolic CHF (congestive heart failure) (HCC), ECHO 03/2017 EF 50-55%, on Torsemide    Morbid obesity (HCC)   Relevant Medications   tirzepatide  (MOUNJARO ) 12.5 MG/0.5ML Pen   Vitamin D  deficiency   B12 deficiency   Hypertension associated with diabetes (HCC) (Chronic)   Relevant Medications   tirzepatide  (MOUNJARO ) 12.5 MG/0.5ML Pen   Hyperlipidemia associated with type 2 diabetes mellitus (HCC) (Chronic)   Relevant Medications   tirzepatide  (MOUNJARO ) 12.5 MG/0.5ML Pen   Diabetes (HCC) - Primary (Chronic)   Relevant Medications   tirzepatide  (MOUNJARO ) 12.5 MG/0.5ML Pen   Other Visit Diagnoses       BMI 39.0-39.9,adult Current BMI 39.1          Obesity  Significant weight loss of 70 pounds with a total weight loss of 1 pound since the last visit. Following a category 4 nutrition plan 90% of the time, consuming whole foods, adequate protein, and  water. Engaging in regular physical activity, including walking and using a pedal bike seven days a week.  Body adipose percentage decreased from 46.8% to 39.7%. Goal is to reduce body adipose to 25% or less.  His current weight goal is to be under 300 pounds. Mounjaro  12.5 mg weekly for primary indication of T2DM is contributing to weight loss and metabolic health. - Continue Mounjaro  12.5 mg weekly. - Continue current nutrition plan and  physical activity regimen. - Set a long-term goal of reducing body adipose to 25% or less. - Aim for a next weight goal of under 300 pounds.  Type 2 diabetes mellitus A1c is well-controlled at 6.3%. Mounjaro  is contributing to improved glycemic control. Emphasis on minimizing simple carbohydrates and maintaining a balanced diet. Lab Results  Component Value Date   HGBA1C 6.3 08/05/2024   HGBA1C 6.2 (H) 03/22/2024   HGBA1C 6.1 (H) 01/27/2024   Lab Results  Component Value Date   MICROALBUR 1.8 08/23/2020   LDLCALC 65 08/05/2024   CREATININE 1.27 08/05/2024   INSULIN   Date Value Ref Range Status  03/16/2023 18.4 2.6 - 24.9 uIU/mL Final  ]He is working  on nutrition plan to decrease simple carbohydrates, increase lean proteins and exercise to promote weight loss and improve glycemic control . - Continue/refill Mounjaro  12.5 mg weekly. - Maintain current dietary habits, focusing on minimizing simple carbohydrates. Meds ordered this encounter  Medications   tirzepatide  (MOUNJARO ) 12.5 MG/0.5ML Pen    Sig: Inject 12.5 mg into the skin once a week.    Dispense:  2 mL    Refill:  0    Chronic diastolic heart failure Currently on carvedilol  12.5 mg twice daily, torsemide  20 mg BID and KCL . Engaging in regular physical activity, which is beneficial for heart health. Edema stable and much improved overall. Reports increased endurance with activities.  Continue to work on nutrition plan to promote weight loss and improve HF management.  - Continue carvedilol  12.5 mg twice daily and Torsemide  and KCL supplementation.  Hyperlipidemia Cholesterol levels are well-managed with simvastatin  20 mg daily. HDL cholesterol has increased, likely due to increased physical activity. Long-term use of cholesterol medication is recommended due to past health issues. Lab Results  Component Value Date   CHOL 121 08/05/2024   CHOL 121 03/22/2024   CHOL 107 09/15/2023   Lab Results  Component Value  Date   HDL 36.80 (L) 08/05/2024   HDL 32 (L) 03/22/2024   HDL 39 (L) 09/15/2023   Lab Results  Component Value Date   LDLCALC 65 08/05/2024   LDLCALC 63 03/22/2024   LDLCALC 44 09/15/2023   Lab Results  Component Value Date   TRIG 98.0 08/05/2024   TRIG 146 03/22/2024   TRIG 135 09/15/2023   Lab Results  Component Value Date   CHOLHDL 3 08/05/2024   CHOLHDL 4.5 07/31/2022   CHOLHDL 4 10/03/2021   No results found for: LDLDIRECT LDL slightly above goal. HDL improving, but not at goal. Trig- at goal/improved.  - Continue simvastatin  20 mg daily. - COntinue Mounjaro  to also decrease CV risks. Continue to work on nutrition plan -decreasing simple carbohydrates, increasing lean proteins, decreasing saturated fats and cholesterol , avoiding trans fats and exercise as able to promote weight loss, improve lipids and decrease cardiovascular risks. - Continue regular physical activity to maintain cholesterol levels.  Vitamin D  deficiency Currently taking vitamin D3 2000 units daily.  Last vitamin D  Lab Results  Component Value Date   VD25OH 33.64  08/05/2024   Low vitamin D  levels can be associated with adiposity and may result in leptin resistance and weight gain. Also associated with fatigue.  Currently on vitamin D  supplementation without any adverse effects such as nausea, vomiting or muscle weakness.  - Continue vitamin D3 2000 units daily.  Vitamin B12 deficiency Currently taking vitamin B12 500 micrograms daily. Energy levels are reported as good, indicating adequate supplementation. Lab Results  Component Value Date   VITAMINB12 591 08/05/2024   - Continue vitamin B12 500 micrograms daily. Vitals Temp: 98.9 F (37.2 C) BP: 123/82 Pulse Rate: 90 SpO2: 98 %   Anthropometric Measurements Height: 6' 3 (1.905 m) Weight: (!) 313 lb (142 kg) BMI (Calculated): 39.12 Weight at Last Visit: 314 lb Weight Lost Since Last Visit: 1 lb Weight Gained Since Last Visit:  0 Starting Weight: 383 lb Total Weight Loss (lbs): 70 lb (31.8 kg) Peak Weight: 383 lb   Body Composition  Body Fat %: 39.7 % Fat Mass (lbs): 124.2 lbs Muscle Mass (lbs): 179.6 lbs Total Body Water (lbs): 145.6 lbs Visceral Fat Rating : 24   Other Clinical Data Fasting: Yes Labs: Yes Today's Visit #: 25 Starting Date: 07/31/22     ASSESSMENT AND PLAN:  Diet: Maze is currently in the action stage of change. As such, his goal is to continue with weight loss efforts. He has agreed to Category 4 Plan.  Exercise: Isamu has been instructed to work up to a goal of 150 minutes of combined cardio and strengthening exercise per week for weight loss and overall health benefits.   Behavior Modification:  We discussed the following Behavioral Modification Strategies today: increasing lean protein intake, decreasing simple carbohydrates, increasing vegetables, increase H2O intake, increase high fiber foods, no skipping meals, meal planning and cooking strategies, holiday eating strategies, avoiding temptations, and planning for success. We discussed various medication options to help Rizwan with his weight loss efforts and we both agreed to continue current treatment plan.  Return in about 4 weeks (around 10/05/2024).SABRA He was informed of the importance of frequent follow up visits to maximize his success with intensive lifestyle modifications for his multiple health conditions.  Attestation Statements:   Reviewed by clinician on day of visit: allergies, medications, problem list, medical history, surgical history, family history, social history, and previous encounter notes.   Time spent on visit including pre-visit chart review and post-visit care and charting was 31 minutes.    Kaydin Labo, PA-C

## 2024-09-07 ENCOUNTER — Encounter (INDEPENDENT_AMBULATORY_CARE_PROVIDER_SITE_OTHER): Payer: Self-pay | Admitting: Physician Assistant

## 2024-09-07 ENCOUNTER — Ambulatory Visit (INDEPENDENT_AMBULATORY_CARE_PROVIDER_SITE_OTHER): Payer: Self-pay | Admitting: Physician Assistant

## 2024-09-07 ENCOUNTER — Other Ambulatory Visit (HOSPITAL_COMMUNITY): Payer: Self-pay

## 2024-09-07 VITALS — BP 123/82 | HR 90 | Temp 98.9°F | Ht 75.0 in | Wt 313.0 lb

## 2024-09-07 DIAGNOSIS — E1159 Type 2 diabetes mellitus with other circulatory complications: Secondary | ICD-10-CM | POA: Diagnosis not present

## 2024-09-07 DIAGNOSIS — Z6839 Body mass index (BMI) 39.0-39.9, adult: Secondary | ICD-10-CM

## 2024-09-07 DIAGNOSIS — E1169 Type 2 diabetes mellitus with other specified complication: Secondary | ICD-10-CM

## 2024-09-07 DIAGNOSIS — E1165 Type 2 diabetes mellitus with hyperglycemia: Secondary | ICD-10-CM | POA: Diagnosis not present

## 2024-09-07 DIAGNOSIS — E538 Deficiency of other specified B group vitamins: Secondary | ICD-10-CM

## 2024-09-07 DIAGNOSIS — Z794 Long term (current) use of insulin: Secondary | ICD-10-CM

## 2024-09-07 DIAGNOSIS — E785 Hyperlipidemia, unspecified: Secondary | ICD-10-CM | POA: Diagnosis not present

## 2024-09-07 DIAGNOSIS — E559 Vitamin D deficiency, unspecified: Secondary | ICD-10-CM

## 2024-09-07 DIAGNOSIS — I5032 Chronic diastolic (congestive) heart failure: Secondary | ICD-10-CM

## 2024-09-07 DIAGNOSIS — Z7985 Long-term (current) use of injectable non-insulin antidiabetic drugs: Secondary | ICD-10-CM

## 2024-09-07 DIAGNOSIS — I152 Hypertension secondary to endocrine disorders: Secondary | ICD-10-CM

## 2024-09-07 MED ORDER — TIRZEPATIDE 12.5 MG/0.5ML ~~LOC~~ SOAJ
12.5000 mg | SUBCUTANEOUS | 0 refills | Status: DC
  Filled 2024-09-07: qty 2, 28d supply, fill #0

## 2024-09-13 ENCOUNTER — Other Ambulatory Visit: Payer: Self-pay | Admitting: Family Medicine

## 2024-10-04 NOTE — Progress Notes (Unsigned)
 SUBJECTIVE: Discussed the use of AI scribe software for clinical note transcription with the patient, who gave verbal consent to proceed.  Chief Complaint: Obesity  Interim History: He is up 2 lbs since his last visit.  Down 68 lbs overall TBW Loss of 17.8%  Luis [name] is here to discuss his progress with his obesity treatment plan. He is on the Category 4 Plan and states he is following his eating plan approximately 65 % of the time. He states he is exercising using pedaler 60 minutes 7 times per week. Luis [name] is a 58 year old male with obesity, type 2 diabetes, and chronic diastolic heart failure who presents for follow-up of his obesity treatment plan.  He is on Mounjaro  12.5 mg weekly for type 2 diabetes management and medical weight loss. He reports that he is able to walk normally and paddle without significant pain, and that his swelling is about the same as previously. His last A1c was 6.3 on August 05, 2024.  He has experienced a weight loss of 68 pounds and aims to maintain his weight during the holiday season. He reports a slight weight gain of two pounds recently but is focused on maintaining his progress.   He has set a short-term goal to be under 300 pounds, with a long-term goal of reaching 250-260 pounds.  He engages in pedaling as his primary form of exercise and is considering indoor walking videos due to cold weather and concerns about dogs in his neighborhood. No issues with swelling that previously hindered his walking, and he can now walk normally and paddle without significant pain.  He follows a meal plan and we discussed goals of  2200 calories per day with 140 grams of protein today with plans to repeat a fasting IC at his next visit given his significant weight loss.   No nausea, vomiting, constipation, or diarrhea with his current Mounjaro  dose. He maintains a routine of taking multiple medications, including vitamins, and hopes to reduce this in the  future.  OBJECTIVE: Visit Diagnoses: Problem List Items Addressed This Visit     Chronic diastolic CHF (congestive heart failure) (HCC), ECHO 03/2017 EF 50-55%, on Torsemide    Morbid obesity (HCC)   Relevant Medications   tirzepatide  (MOUNJARO ) 12.5 MG/0.5ML Pen   Vitamin D  deficiency   Hypertension associated with diabetes (HCC) (Chronic)   Relevant Medications   tirzepatide  (MOUNJARO ) 12.5 MG/0.5ML Pen   Hyperlipidemia associated with type 2 diabetes mellitus (HCC) (Chronic)   Relevant Medications   tirzepatide  (MOUNJARO ) 12.5 MG/0.5ML Pen   Diabetes (HCC) - Primary (Chronic)   Relevant Medications   tirzepatide  (MOUNJARO ) 12.5 MG/0.5ML Pen   Other Visit Diagnoses       BMI 39.0-39.9,adult Current BMI 39.4         Obesity Significant weight loss of 68 pounds. Current weight is under 300 pounds with a goal of 250-260 pounds.  Weight gain of 2 pounds since last visit over the Thanksgiving holiday and does endorse eating off plan over holiday.  No nausea, vomiting, constipation, or diarrhea with current Mounjaro  dose. Appetite has normalized/back to feeling appropriately hungry and not excessively hungry. No issues with edema or mobility. Engages in pedaling and we discussed considering indoor walking videos for exercise. - Continue Mounjaro  12.5 mg weekly for primary indication of T2DM. - Encouraged indoor walking videos for exercise. - Maintain calorie intake at 2200 calories per day with 140 grams of protein. - Will repeat metabolism test on January 5th,  2026, with fasting for 8 hours prior. - Will discuss potential journaling and calorie counting plan at next visit.  Type 2 diabetes mellitus Well-controlled with Mounjaro  12.5 mg weekly started 1 month ago. Denies mass in neck, dysphagia, dyspepsia, persistent hoarseness, abdominal pain, or N/V/Constipation or diarrhea. Has annual eye exam. Mood is stable.  . Last A1c on October 3rd, 2025, was 6.3%. Successfully discontinued  insulin  while on Mounjaro . Lab Results  Component Value Date   HGBA1C 6.3 08/05/2024   HGBA1C 6.2 (H) 03/22/2024   HGBA1C 6.1 (H) 01/27/2024   Lab Results  Component Value Date   MICROALBUR 1.8 08/23/2020   LDLCALC 65 08/05/2024   CREATININE 1.27 08/05/2024   He is working  on nutrition plan to decrease simple carbohydrates, increase lean proteins and exercise to promote weight loss and improve glycemic control . - Continue/refill Mounjaro  12.5 mg weekly. Meds ordered this encounter  Medications   tirzepatide  (MOUNJARO ) 12.5 MG/0.5ML Pen    Sig: Inject 12.5 mg into the skin once a week.    Dispense:  2 mL    Refill:  0    Chronic diastolic heart failure Improved symptoms due to weight loss. Edema in lower extremities has significantly improved. Using compression stockings and elevation to help with any residual edema. No current issues with mobility or pain. On carvedilol  12.5 mg BID, torsemide  20 mg BID and Kcl 20 mEq daily - Continue current management and monitor symptoms. Continue to work on nutrition plan to promote weight loss and improve HF management.    Hyperlipidemia LDL is not at goal. Medication(s): simvastatin  20 mg daily- no SE and Mounjaro  12.5 mg weekly. Cardiovascular risk factors: advanced age (older than 7 for men, 43 for women), diabetes mellitus, dyslipidemia, family history of premature cardiovascular disease, hypertension, male gender, obesity (BMI >= 30 kg/m2), sedentary lifestyle, and smoking/ tobacco exposure  Lab Results  Component Value Date   CHOL 121 08/05/2024   HDL 36.80 (L) 08/05/2024   LDLCALC 65 08/05/2024   TRIG 98.0 08/05/2024   CHOLHDL 3 08/05/2024   CHOLHDL 4.5 07/31/2022   CHOLHDL 4 10/03/2021   Lab Results  Component Value Date   ALT 16 08/05/2024   AST 13 08/05/2024   ALKPHOS 110 08/05/2024   BILITOT 0.6 08/05/2024   The ASCVD Risk score (Arnett DK, et al., 2019) failed to calculate for the following reasons:   The valid  total cholesterol range is 130 to 320 mg/dL  Plan: Continue simvastatin .  Continue Mounjaro  to also decrease CV risks.  Continue to encourage smoking cessation.  Continue to work on nutrition plan -decreasing simple carbohydrates, increasing lean proteins, decreasing saturated fats and cholesterol , avoiding trans fats and exercise as able to promote weight loss, improve lipids and decrease cardiovascular risks.   Vitamin D  Deficiency Vitamin D  is not at goal of 50.  Most recent vitamin D  level was 34. He is on OTC vitamin D3 2000 IU daily. Lab Results  Component Value Date   VD25OH 33.64 08/05/2024   VD25OH 73.9 03/22/2024   VD25OH 84.0 01/27/2024    Plan: Continue OTC vitamin D3 2000 IU daily Low vitamin D  levels can be associated with adiposity and may result in leptin resistance and weight gain. Also associated with fatigue.  Currently on vitamin D  supplementation without any adverse effects such as nausea, vomiting or muscle weakness.    Vitals Temp: 98.4 F (36.9 C) BP: 122/81 Pulse Rate: 88 SpO2: 97 %   Anthropometric Measurements Height: 6'  3 (1.905 m) Weight: (!) 315 lb (142.9 kg) BMI (Calculated): 39.37 Weight at Last Visit: 313 lbs Weight Lost Since Last Visit: 0 Weight Gained Since Last Visit: 2 lbs Starting Weight: 383 lbs Total Weight Loss (lbs): 68 lb (30.8 kg) Peak Weight: 383 lbs   Body Composition  Body Fat %: 41 % Fat Mass (lbs): 129.2 lbs Muscle Mass (lbs): 177 lbs Visceral Fat Rating : 25   Other Clinical Data Fasting: yes Today's Visit #: 26 Starting Date: 07/31/22     ASSESSMENT AND PLAN:  Diet: Gleb is currently in the action stage of change. As such, his goal is to continue with weight loss efforts. He has agreed to Category 4 Plan.  Exercise: Tirrell has been instructed to work up to a goal of 150 minutes of combined cardio and strengthening exercise per week, to continue exercising as is, and consider walking videos to  keep activity level up during colder months for weight loss and overall health benefits.   Behavior Modification:  We discussed the following Behavioral Modification Strategies today: increasing lean protein intake, decreasing simple carbohydrates, increasing vegetables, increase H2O intake, increase high fiber foods, meal planning and cooking strategies, holiday eating strategies, avoiding temptations, and planning for success. We discussed various medication options to help Zeeshan with his weight loss efforts and we both agreed to continue Mounjaro  12.5 mg weekly for T2DM .  Return in about 5 weeks (around 11/09/2024) for Fasting IC- Asked pt to arrive at least 30 minutes early for IC testing. .. He was informed of the importance of frequent follow up visits to maximize his success with intensive lifestyle modifications for his multiple health conditions.  Attestation Statements:   Reviewed by clinician on day of visit: allergies, medications, problem list, medical history, surgical history, family history, social history, and previous encounter notes.   Time spent on visit including pre-visit chart review and post-visit care and charting was 30 minutes.    Brynli Ollis, PA-C

## 2024-10-05 ENCOUNTER — Encounter (INDEPENDENT_AMBULATORY_CARE_PROVIDER_SITE_OTHER): Payer: Self-pay | Admitting: Physician Assistant

## 2024-10-05 ENCOUNTER — Ambulatory Visit (INDEPENDENT_AMBULATORY_CARE_PROVIDER_SITE_OTHER): Payer: Self-pay | Admitting: Physician Assistant

## 2024-10-05 ENCOUNTER — Other Ambulatory Visit (HOSPITAL_COMMUNITY): Payer: Self-pay

## 2024-10-05 VITALS — BP 122/81 | HR 88 | Temp 98.4°F | Ht 75.0 in | Wt 315.0 lb

## 2024-10-05 DIAGNOSIS — E1159 Type 2 diabetes mellitus with other circulatory complications: Secondary | ICD-10-CM

## 2024-10-05 DIAGNOSIS — Z794 Long term (current) use of insulin: Secondary | ICD-10-CM

## 2024-10-05 DIAGNOSIS — E1165 Type 2 diabetes mellitus with hyperglycemia: Secondary | ICD-10-CM | POA: Diagnosis not present

## 2024-10-05 DIAGNOSIS — E1169 Type 2 diabetes mellitus with other specified complication: Secondary | ICD-10-CM

## 2024-10-05 DIAGNOSIS — I5032 Chronic diastolic (congestive) heart failure: Secondary | ICD-10-CM

## 2024-10-05 DIAGNOSIS — E559 Vitamin D deficiency, unspecified: Secondary | ICD-10-CM

## 2024-10-05 DIAGNOSIS — Z6839 Body mass index (BMI) 39.0-39.9, adult: Secondary | ICD-10-CM

## 2024-10-05 DIAGNOSIS — Z7985 Long-term (current) use of injectable non-insulin antidiabetic drugs: Secondary | ICD-10-CM

## 2024-10-05 DIAGNOSIS — E785 Hyperlipidemia, unspecified: Secondary | ICD-10-CM | POA: Diagnosis not present

## 2024-10-05 MED ORDER — TIRZEPATIDE 12.5 MG/0.5ML ~~LOC~~ SOAJ
12.5000 mg | SUBCUTANEOUS | 0 refills | Status: DC
  Filled 2024-10-05: qty 2, 28d supply, fill #0

## 2024-10-07 ENCOUNTER — Ambulatory Visit: Admitting: Podiatry

## 2024-10-07 ENCOUNTER — Encounter: Payer: Self-pay | Admitting: Podiatry

## 2024-10-07 DIAGNOSIS — I872 Venous insufficiency (chronic) (peripheral): Secondary | ICD-10-CM

## 2024-10-07 DIAGNOSIS — E1165 Type 2 diabetes mellitus with hyperglycemia: Secondary | ICD-10-CM | POA: Diagnosis not present

## 2024-10-07 DIAGNOSIS — M79675 Pain in left toe(s): Secondary | ICD-10-CM | POA: Diagnosis not present

## 2024-10-07 DIAGNOSIS — M79674 Pain in right toe(s): Secondary | ICD-10-CM

## 2024-10-07 DIAGNOSIS — B351 Tinea unguium: Secondary | ICD-10-CM

## 2024-10-07 DIAGNOSIS — Z794 Long term (current) use of insulin: Secondary | ICD-10-CM

## 2024-10-07 NOTE — Progress Notes (Signed)
 This patient returns to my office for at risk foot care.  This patient requires this care by a professional since this patient will be at risk due to having diabetes and venous stasis.  This patient is unable to cut nails himself since the patient cannot reach his nails.These nails are painful walking and wearing shoes.  This patient presents for at risk foot care today. ? ?General Appearance  Alert, conversant and in no acute stress. ? ?Vascular  Dorsalis pedis and posterior tibial  pulses are not palpable  bilaterally due to severe swelling..  Capillary return is within normal limits  bilaterally. Temperature is within normal limits  bilaterally. ? ?Neurologic  Senn-Weinstein monofilament wire test within normal limits  bilaterally. Muscle power within normal limits bilaterally. ? ?Nails Thick disfigured discolored nails with subungual debris  from hallux to fifth toes bilaterally. No evidence of bacterial infection or drainage bilaterally. ? ?Orthopedic  No limitations of motion  feet .  No crepitus or effusions noted.  No bony pathology or digital deformities noted. ? ?Skin  normotropic skin with no porokeratosis noted bilaterally.  No signs of infections or ulcers noted.    ? ?Onychomycosis  Pain in right toes  Pain in left toes  Swelling legs/feet ? ?Consent was obtained for treatment procedures.   Mechanical debridement of nails 1-5  bilaterally performed with a nail nipper.  Filed with dremel without incident.  ? ? ?Return office visit   3 months                   Told patient to return for periodic foot care and evaluation due to potential at risk complications. ? ? ?Helane Gunther DPM  ?

## 2024-10-18 ENCOUNTER — Telehealth: Payer: Self-pay | Admitting: *Deleted

## 2024-10-18 ENCOUNTER — Telehealth: Payer: Self-pay | Admitting: Family Medicine

## 2024-10-18 NOTE — Telephone Encounter (Signed)
 Copied from CRM [id number]. Topic: General - Other >> Oct 17, 2024 11:57 AM Tonda B wrote: Reason for CRM: patient is calling to see if he can get a 6 month extension on his fmla please call pt back [phone]   [phone]   Spoke with patient, notified we need a new FMLA form to be send for completion  Patient verbalized understanding  Jasia Hiltunen,RMA

## 2024-10-18 NOTE — Telephone Encounter (Signed)
 [employer] faxed FMLA, to be filled out by provider. Patient requested to send it back via Fax within ASAP. Document is located in providers tray at front office.Please advise at [phone].

## 2024-10-18 NOTE — Telephone Encounter (Signed)
 SABRA

## 2024-10-19 NOTE — Telephone Encounter (Signed)
Placed to be reviewed in PCP office

## 2024-10-25 ENCOUNTER — Encounter: Payer: Self-pay | Admitting: Family Medicine

## 2024-10-25 NOTE — Telephone Encounter (Signed)
FMLA placed in PCP office to be reviewed

## 2024-10-26 NOTE — Telephone Encounter (Addendum)
 Patient called to confirm receipt of FMLA paperwork and has been made aware of previous notations confirming receipt.

## 2024-10-31 NOTE — Telephone Encounter (Signed)
 FMLA faxed to 1 [phone] Form placed to be scan in patient chart

## 2024-11-07 ENCOUNTER — Other Ambulatory Visit (HOSPITAL_COMMUNITY): Payer: Self-pay

## 2024-11-07 ENCOUNTER — Ambulatory Visit (INDEPENDENT_AMBULATORY_CARE_PROVIDER_SITE_OTHER): Admitting: Physician Assistant

## 2024-11-07 DIAGNOSIS — E1165 Type 2 diabetes mellitus with hyperglycemia: Secondary | ICD-10-CM

## 2024-11-07 MED ORDER — TIRZEPATIDE 12.5 MG/0.5ML ~~LOC~~ SOAJ
12.5000 mg | SUBCUTANEOUS | 0 refills | Status: DC
  Filled 2024-11-07: qty 2, 28d supply, fill #0

## 2024-11-08 ENCOUNTER — Telehealth: Payer: Self-pay

## 2024-11-08 ENCOUNTER — Other Ambulatory Visit (HOSPITAL_COMMUNITY): Payer: Self-pay

## 2024-11-08 NOTE — Telephone Encounter (Signed)
 Copied from CRM [id number]. Topic: General - Other >> Oct 17, 2024 11:57 AM Tonda B wrote: Reason for CRM: patient is calling to see if he can get a 6 month extension on his fmla please call pt back [phone]   [phone] >> Nov 08, 2024  1:44 PM Rea ORN wrote: Pt called to advise [employer] received a form on 12/30. [employer] said the frequency and number of days were missing on the form. Pt stated he wants to keep it as 5 days a month.  Please call back to advise,  [phone] >> Nov 08, 2024  9:32 AM Deleta RAMAN wrote: Patient is calling due to paper work not received by [facility] he would like to paperwork sent over to [employer] regarding his leave. Please contact parient once the paper is sent over   [facility] faxed to [phone] Form placed to be scan in patient chart

## 2024-11-08 NOTE — Telephone Encounter (Signed)
 Copied from CRM [id number]. Topic: General - Other >> Oct 17, 2024 11:57 AM Tonda B wrote: Reason for CRM: patient is calling to see if he can get a 6 month extension on his fmla please call pt back [phone]   [phone]   FMLA was refaxed to [phone] Patient notified  [facility]

## 2024-11-09 ENCOUNTER — Ambulatory Visit (INDEPENDENT_AMBULATORY_CARE_PROVIDER_SITE_OTHER): Admitting: Physician Assistant

## 2024-11-14 ENCOUNTER — Telehealth: Payer: Self-pay | Admitting: Family Medicine

## 2024-11-14 NOTE — Telephone Encounter (Signed)
 [employer] faxed FMLA, to be filled out by provider. [employer] requested to send it back via Fax within ASAP. Document is located in providers tray at front office.Please advise at [phone].

## 2024-11-15 NOTE — Telephone Encounter (Signed)
 FMLA form Please to be reviewed in PCP office

## 2024-11-16 ENCOUNTER — Telehealth: Payer: Self-pay | Admitting: *Deleted

## 2024-11-16 NOTE — Telephone Encounter (Signed)
 FMLA fased to [phone]

## 2024-11-16 NOTE — Telephone Encounter (Signed)
 Copied from CRM [id number]. Topic: General - Other >> Nov 16, 2024 10:18 AM Delon DASEN wrote: Reason for CRM: FMLA form did not have number of days, frequency of absences and doctor signature was missing- need this corrected asap and refaxed to [phone]   FMLA refaxed to [phone]  [facility]

## 2024-12-05 NOTE — Progress Notes (Unsigned)
 "  SUBJECTIVE: Discussed the use of AI scribe software for clinical note transcription with the patient, who gave verbal consent to proceed.  Chief Complaint: Obesity  Interim History: He is down 5 lbs since his last visit.  Down 73 lbs overall TBW loss of 19.1%   Luis [name] is here to discuss his progress with his obesity treatment plan. He is on the Category 4 Plan and states he is following his eating plan approximately 80 % of the time. He states he is exercising pedal bike 30 minutes 3 times per week.  Luis [name] is a 59 year old male with obesity, type 2 diabetes, and chronic diastolic heart failure who presents for follow-up of his obesity treatment plan.  He has successfully lost 73 pounds, reducing his weight from 383 pounds to 310 pounds. His resting energy expenditure has decreased from 3355 calories to 2419 calories per day. He follows a meal plan providing approximately 2000 calories per day. His adipose tissue has decreased from 129.2 pounds to 124.4 pounds while his muscle mass has remained at 177 pounds.  He has a history of type 2 diabetes with significant improvement in blood glucose control. He is no longer on insulin  and manages his diabetes with Mounjaro  injections. His blood glucose levels are rarely over 120 mg/dL, with recent readings around 111 mg/dL. Previously, his glucose levels were often in the 200s and 300s, with instances where his meter could not read his levels due to being too high.  He has chronic diastolic heart failure. He has not been admitted to the hospital or visited the ER in the past year, a significant change from previous years when he frequently required hospital care. He experiences chronic leg swelling, which has improved with weight management and medication. He takes torsemide  and carvedilol , and his leg swelling is manageable, allowing him to walk normally without pain or shortness of breath.  His current medications include torsemide ,  carvedilol , potassium, baby aspirin , simvastatin , vitamin B, and vitamin D . He reports no issues with these medications and has reduced the number of medications he takes compared to the previous year. No shortness of breath or labored breathing. No recent hospital admissions or ER visits.  Repeat indirect calorimetry today:  IC today 2419 calories IC 07/31/22 3355 calories  Decreased by 936 calories since last IC  Current Outpatient Medications  Medication Instructions   aspirin  EC 81 mg, Oral, Daily   carvedilol  (COREG ) 12.5 mg, Oral, 2 times daily with meals   Continuous Glucose Sensor (FREESTYLE LIBRE 3 PLUS SENSOR) MISC Change sensor every 15 days.   glucose blood test strip Check glucose TID/ E11.9   Insulin  Pen Needle 31G X 8 MM MISC Use to inject insulin  twice daily   Insulin  Pen Needle 32G X 6 MM MISC Use to administer insulin  two times daily   Multiple Vitamins-Minerals (MULTIVITAMIN WITH MINERALS) tablet 1 tablet, Oral, Daily   mupirocin  ointment (BACTROBAN ) 2 % 1 Application, Topical, 2 times daily   OneTouch Delica Lancets 33G MISC Use TID/ E11.9   potassium chloride  SA (KLOR-CON  M) 20 MEQ tablet 20 mEq, Oral, Daily   simvastatin  (ZOCOR ) 20 MG tablet TAKE 1 TABLET(20 MG) BY MOUTH AT BEDTIME   tirzepatide  (MOUNJARO ) 12.5 mg, Subcutaneous, Weekly   torsemide  (DEMADEX ) 20 mg, Oral, 2 times daily   Vitamin B 12 500 mcg, Oral, Daily   Vitamin D3 2,000 Units, Oral, Daily    OBJECTIVE: Visit Diagnoses: Problem List Items Addressed This Visit  Chronic diastolic CHF (congestive heart failure) (HCC), ECHO 03/2017 EF 50-55%, on Torsemide    Bilateral edema of lower extremity   Morbid obesity (HCC)   Relevant Medications   tirzepatide  (MOUNJARO ) 12.5 MG/0.5ML Pen   SOBOE (shortness of breath on exertion) - Primary   Vitamin D  deficiency   Hypertension associated with diabetes (HCC) (Chronic)   Relevant Medications   tirzepatide  (MOUNJARO ) 12.5 MG/0.5ML Pen   Hyperlipidemia  associated with type 2 diabetes mellitus (HCC) (Chronic)   Relevant Medications   tirzepatide  (MOUNJARO ) 12.5 MG/0.5ML Pen   Diabetes (HCC) (Chronic)   Relevant Medications   tirzepatide  (MOUNJARO ) 12.5 MG/0.5ML Pen   Other Visit Diagnoses       BMI 38.0-38.9,adult Current BMI 38.8         SOBOE Repeat indirect calorimetry today:  IC today 2419 calories IC 07/31/22 3355 calories  Decreased by 936 calories since last IC Plan: Continue Cat 4 meal plan for now which should continue to promote gradual healthy weight loss.  Angeldejesus does not feel that he gets out of breath more easily that he used to when he exercises. Jaquane's shortness of breath appears to be obesity related and exercise induced. He has agreed to work on weight loss and gradually increase exercise to treat his exercise induced shortness of breath. Will continue to monitor closely.  Morbid obesity Significant weight loss of 73 pounds, current weight 310 pounds. Resting energy expenditure decreased from 3355 to 2419 calories per day. Improved body composition with increased lean mass and decreased adipose mass. No hospital admissions or ER visits in the past year, indicating improved health status. - Continue meal plan four with ~2000 calories / 120+ grams of protein per day. - Encouraged gradual weight loss of 1-2 pounds per week. - Scheduled follow-up appointment on Tuesday, March 10th, at 12:00 PM.  Type 2 Diabetes Mellitus with circulatory complications, without long-term current use of insulin  HgbA1c is at goal. Last A1c was 6.3 CBGs: Fasting 110-120 Episodes of hypoglycemia: no Medication(s): Mounjaro  12.5 mg SQ weekly  Denies mass in neck, dysphagia, dyspepsia, persistent hoarseness, abdominal pain, or N/V/Constipation or diarrhea. Has annual eye exam. Mood is stable.   Lab Results  Component Value Date   HGBA1C 6.3 08/05/2024   HGBA1C 6.2 (H) 03/22/2024   HGBA1C 6.1 (H) 01/27/2024   Lab Results  Component  Value Date   MICROALBUR 1.8 08/23/2020   LDLCALC 65 08/05/2024   CREATININE 1.27 08/05/2024   Lab Results  Component Value Date   GFR 63.17 08/05/2024   GFR 79.16 10/03/2021   GFR 80.21 06/16/2019    Plan: Continue and refill Mounjaro  12.5 mg SQ weekly He is working  on nutrition plan to decrease simple carbohydrates, increase lean proteins and exercise to promote weight loss and improve glycemic control . Meds ordered this encounter  Medications   tirzepatide  (MOUNJARO ) 12.5 MG/0.5ML Pen    Sig: Inject 12.5 mg into the skin once a week.    Dispense:  2 mL    Refill:  0     Chronic diastolic heart failure Improved symptoms with weight loss. No recent hospital admissions or ER visits. No shortness of breath or labored breathing during physical activity. - Continue current medications: torsemide , carvedilol , and baby aspirin . Continue compression stocking for edema control.   Hypertension Hypertension well controlled, improved, asymptomatic, and no significant medication side effects noted.  Medication(s): carvedilol  12.5 mg BID   torsemide  20 mg daily   KCL 20 mEq daily  BP Readings  from Last 3 Encounters:  12/06/24 111/68  10/05/24 122/81  09/07/24 123/82   Lab Results  Component Value Date   CREATININE 1.27 08/05/2024   CREATININE 1.34 (H) 03/22/2024   CREATININE 1.30 (H) 01/27/2024   Lab Results  Component Value Date   GFR 63.17 08/05/2024   GFR 79.16 10/03/2021   GFR 80.21 06/16/2019    Plan: Continue all antihypertensives at current dosages. carvedilol  12.5 mg BID, torsemide  20 mg daily   And KCL 20 mEq daily Continue to work on nutrition plan to promote weight loss and improve BP control.    Hyperlipidemia LDL is not at goal. Medication(s): simvastatin  20 mg daily. Reports no SE.    Mounjaro  12.5 mg weekly.  Cardiovascular risk factors: advanced age (older than 39 for men, 46 for women), diabetes mellitus, dyslipidemia, family history of premature  cardiovascular disease, hypertension, male gender, obesity (BMI >= 30 kg/m2), sedentary lifestyle, and smoking/ tobacco exposure  Lab Results  Component Value Date   CHOL 121 08/05/2024   HDL 36.80 (L) 08/05/2024   LDLCALC 65 08/05/2024   TRIG 98.0 08/05/2024   CHOLHDL 3 08/05/2024   CHOLHDL 4.5 07/31/2022   CHOLHDL 4 10/03/2021   Lab Results  Component Value Date   ALT 16 08/05/2024   AST 13 08/05/2024   ALKPHOS 110 08/05/2024   BILITOT 0.6 08/05/2024   The ASCVD Risk score (Arnett DK, et al., 2019) failed to calculate for the following reasons:   The valid total cholesterol range is 130 to 320 mg/dL  Plan: Continue statin. Continue Mounjaro   Continue to work on nutrition plan -decreasing simple carbohydrates, increasing lean proteins, decreasing saturated fats and cholesterol , avoiding trans fats and exercise as able to promote weight loss, improve lipids and decrease cardiovascular risks.  Vitamin D  Deficiency Vitamin D  is not at goal of 50.  Most recent vitamin D  level was 34. He is on OTC vitamin D3 2000 IU daily. No SE with OTC vitamin D   Lab Results  Component Value Date   VD25OH 33.64 08/05/2024   VD25OH 73.9 03/22/2024   VD25OH 84.0 01/27/2024    Plan: Continue OTC vitamin D3 2000 IU daily Low vitamin D  levels can be associated with adiposity and may result in leptin resistance and weight gain. Also associated with fatigue.  Currently on vitamin D  supplementation without any adverse effects such as nausea, vomiting or muscle weakness.    Chronic lower extremity edema Improved with weight loss and current medication regimen. - Continue torsemide .  General health maintenance Compliant with current health maintenance regimen, including vitamin supplementation and flu vaccination. - Continue vitamin B and D supplementation. Vitals Temp: 98 F (36.7 C) BP: 111/68 Pulse Rate: 85 SpO2: 95 %   Anthropometric Measurements Height: 6' 3 (1.905 m) Weight:  (!) 310 lb (140.6 kg) BMI (Calculated): 38.75 Weight at Last Visit: 315 lb Weight Lost Since Last Visit: 5 lb Weight Gained Since Last Visit: 0 Starting Weight: 383 lb Total Weight Loss (lbs): 73 lb (33.1 kg) Peak Weight: 383 lb   Body Composition  Body Fat %: 40.1 % Fat Mass (lbs): 124.4 lbs Muscle Mass (lbs): 177 lbs Total Body Water (lbs): 149.8 lbs Visceral Fat Rating : 24   Other Clinical Data Fasting: Yes Labs: Yes Today's Visit #: 27 Starting Date: 07/31/22 Comments: IC DONE TODAY     ASSESSMENT AND PLAN:  Diet: Jakevion is currently in the action stage of change. As such, his goal is to continue with weight loss  efforts. He has agreed to Category 4 Plan.  Exercise: Maddon has been instructed to work up to a goal of 150 minutes of combined cardio and strengthening exercise per week and to continue exercising as is for weight loss and overall health benefits.   Behavior Modification:  We discussed the following Behavioral Modification Strategies today: increasing lean protein intake, decreasing simple carbohydrates, increasing vegetables, increase H2O intake, increase high fiber foods, meal planning and cooking strategies, better snacking choices, avoiding temptations, and planning for success. We discussed various medication options to help Harles with his weight loss efforts and we both agreed to continue Mounjaro  for T2DM.  Return in about 4 weeks (around 01/03/2025) for Fasting Lab.SABRA He was informed of the importance of frequent follow up visits to maximize his success with intensive lifestyle modifications for his multiple health conditions.  Attestation Statements:   Reviewed by clinician on day of visit: allergies, medications, problem list, medical history, surgical history, family history, social history, and previous encounter notes.   Time spent on visit including pre-visit chart review and post-visit care and charting was 32 minutes.    Tarshia Kot, PA-C  "

## 2024-12-06 ENCOUNTER — Encounter (INDEPENDENT_AMBULATORY_CARE_PROVIDER_SITE_OTHER): Payer: Self-pay | Admitting: Physician Assistant

## 2024-12-06 ENCOUNTER — Ambulatory Visit (INDEPENDENT_AMBULATORY_CARE_PROVIDER_SITE_OTHER): Admitting: Physician Assistant

## 2024-12-06 ENCOUNTER — Other Ambulatory Visit (HOSPITAL_COMMUNITY): Payer: Self-pay

## 2024-12-06 ENCOUNTER — Other Ambulatory Visit: Payer: Self-pay

## 2024-12-06 VITALS — BP 111/68 | HR 85 | Temp 98.0°F | Ht 75.0 in | Wt 310.0 lb

## 2024-12-06 DIAGNOSIS — E1169 Type 2 diabetes mellitus with other specified complication: Secondary | ICD-10-CM | POA: Diagnosis not present

## 2024-12-06 DIAGNOSIS — E785 Hyperlipidemia, unspecified: Secondary | ICD-10-CM | POA: Diagnosis not present

## 2024-12-06 DIAGNOSIS — I152 Hypertension secondary to endocrine disorders: Secondary | ICD-10-CM

## 2024-12-06 DIAGNOSIS — I110 Hypertensive heart disease with heart failure: Secondary | ICD-10-CM

## 2024-12-06 DIAGNOSIS — E1165 Type 2 diabetes mellitus with hyperglycemia: Secondary | ICD-10-CM | POA: Diagnosis not present

## 2024-12-06 DIAGNOSIS — R600 Localized edema: Secondary | ICD-10-CM

## 2024-12-06 DIAGNOSIS — E1159 Type 2 diabetes mellitus with other circulatory complications: Secondary | ICD-10-CM | POA: Diagnosis not present

## 2024-12-06 DIAGNOSIS — Z7985 Long-term (current) use of injectable non-insulin antidiabetic drugs: Secondary | ICD-10-CM | POA: Diagnosis not present

## 2024-12-06 DIAGNOSIS — R0602 Shortness of breath: Secondary | ICD-10-CM | POA: Diagnosis not present

## 2024-12-06 DIAGNOSIS — I5032 Chronic diastolic (congestive) heart failure: Secondary | ICD-10-CM

## 2024-12-06 DIAGNOSIS — Z6838 Body mass index (BMI) 38.0-38.9, adult: Secondary | ICD-10-CM

## 2024-12-06 DIAGNOSIS — E559 Vitamin D deficiency, unspecified: Secondary | ICD-10-CM

## 2024-12-06 MED ORDER — TIRZEPATIDE 12.5 MG/0.5ML ~~LOC~~ SOAJ
12.5000 mg | SUBCUTANEOUS | 0 refills | Status: AC
  Filled 2024-12-06: qty 2, 28d supply, fill #0

## 2024-12-07 ENCOUNTER — Ambulatory Visit (INDEPENDENT_AMBULATORY_CARE_PROVIDER_SITE_OTHER): Admitting: Physician Assistant

## 2025-01-06 ENCOUNTER — Ambulatory Visit: Admitting: Podiatry

## 2025-01-10 ENCOUNTER — Ambulatory Visit (INDEPENDENT_AMBULATORY_CARE_PROVIDER_SITE_OTHER): Admitting: Physician Assistant
